# Patient Record
Sex: Female | Born: 1935
Health system: Southern US, Community
[De-identification: ages and names within clinical notes are randomized; demographics above are authoritative.]

## PROBLEM LIST (undated history)

## (undated) DIAGNOSIS — R002 Palpitations: Secondary | ICD-10-CM

## (undated) DIAGNOSIS — I209 Angina pectoris, unspecified: Secondary | ICD-10-CM

## (undated) DIAGNOSIS — Z9889 Other specified postprocedural states: Secondary | ICD-10-CM

## (undated) DIAGNOSIS — C801 Malignant (primary) neoplasm, unspecified: Secondary | ICD-10-CM

## (undated) DIAGNOSIS — E739 Lactose intolerance, unspecified: Secondary | ICD-10-CM

## (undated) DIAGNOSIS — K589 Irritable bowel syndrome without diarrhea: Secondary | ICD-10-CM

## (undated) DIAGNOSIS — N3946 Mixed incontinence: Secondary | ICD-10-CM

## (undated) DIAGNOSIS — I10 Essential (primary) hypertension: Secondary | ICD-10-CM

## (undated) DIAGNOSIS — N390 Urinary tract infection, site not specified: Secondary | ICD-10-CM

## (undated) DIAGNOSIS — H9190 Unspecified hearing loss, unspecified ear: Secondary | ICD-10-CM

## (undated) DIAGNOSIS — K219 Gastro-esophageal reflux disease without esophagitis: Secondary | ICD-10-CM

## (undated) DIAGNOSIS — I351 Nonrheumatic aortic (valve) insufficiency: Secondary | ICD-10-CM

## (undated) DIAGNOSIS — K529 Noninfective gastroenteritis and colitis, unspecified: Secondary | ICD-10-CM

## (undated) DIAGNOSIS — R1013 Epigastric pain: Secondary | ICD-10-CM

## (undated) DIAGNOSIS — I4891 Unspecified atrial fibrillation: Secondary | ICD-10-CM

## (undated) DIAGNOSIS — U071 COVID-19: Secondary | ICD-10-CM

## (undated) DIAGNOSIS — M1990 Unspecified osteoarthritis, unspecified site: Secondary | ICD-10-CM

## (undated) DIAGNOSIS — E538 Deficiency of other specified B group vitamins: Secondary | ICD-10-CM

## (undated) DIAGNOSIS — E785 Hyperlipidemia, unspecified: Secondary | ICD-10-CM

## (undated) DIAGNOSIS — R011 Cardiac murmur, unspecified: Secondary | ICD-10-CM

## (undated) DIAGNOSIS — R112 Nausea with vomiting, unspecified: Secondary | ICD-10-CM

## (undated) DIAGNOSIS — S83252A Bucket-handle tear of lateral meniscus, current injury, left knee, initial encounter: Secondary | ICD-10-CM

## (undated) DIAGNOSIS — N9089 Other specified noninflammatory disorders of vulva and perineum: Secondary | ICD-10-CM

## (undated) HISTORY — PX: APPENDECTOMY: SHX54

## (undated) HISTORY — DX: COVID-19: U07.1

## (undated) HISTORY — DX: Unspecified osteoarthritis, unspecified site: M19.90

## (undated) HISTORY — DX: Lactose intolerance, unspecified: E73.9

## (undated) HISTORY — DX: Epigastric pain: R10.13

## (undated) HISTORY — DX: Other specified postprocedural states: Z98.890

## (undated) HISTORY — PX: BONE GRAFT HIP ILIAC CREST: SUR159

## (undated) HISTORY — PX: MOUTH SURGERY: SHX715

## (undated) HISTORY — DX: Irritable bowel syndrome, unspecified: K58.9

## (undated) HISTORY — PX: DILATION AND CURETTAGE OF UTERUS: SHX78

## (undated) HISTORY — DX: Deficiency of other specified B group vitamins: E53.8

## (undated) HISTORY — DX: Essential (primary) hypertension: I10

## (undated) HISTORY — DX: Nonrheumatic aortic (valve) insufficiency: I35.1

## (undated) HISTORY — DX: Malignant (primary) neoplasm, unspecified: C80.1

## (undated) HISTORY — DX: Other specified noninflammatory disorders of vulva and perineum: N90.89

## (undated) HISTORY — PX: CARDIAC CATHETERIZATION: SHX172

## (undated) HISTORY — DX: Unspecified atrial fibrillation: I48.91

## (undated) HISTORY — DX: Hyperlipidemia, unspecified: E78.5

## (undated) HISTORY — DX: Mixed incontinence: N39.46

## (undated) HISTORY — DX: Urinary tract infection, site not specified: N39.0

## (undated) HISTORY — DX: Noninfective gastroenteritis and colitis, unspecified: K52.9

## (undated) HISTORY — PX: TONSILLECTOMY: SUR1361

## (undated) HISTORY — PX: BREAST REDUCTION SURGERY: SHX8

## (undated) HISTORY — PX: BREAST SURGERY: SHX581

## (undated) HISTORY — PX: ESOPHAGOGASTRODUODENOSCOPY: SHX1529

---

## 1980-11-23 HISTORY — PX: ABDOMINAL HYSTERECTOMY: SHX81

## 1984-11-23 HISTORY — PX: BILATERAL SALPINGOOPHORECTOMY: SHX1223

## 2001-11-02 ENCOUNTER — Encounter (INDEPENDENT_AMBULATORY_CARE_PROVIDER_SITE_OTHER): Payer: Self-pay | Admitting: Specialist

## 2001-11-02 ENCOUNTER — Ambulatory Visit (HOSPITAL_COMMUNITY): Admission: RE | Admit: 2001-11-02 | Discharge: 2001-11-02 | Payer: Self-pay | Admitting: Gastroenterology

## 2001-11-11 ENCOUNTER — Other Ambulatory Visit: Admission: RE | Admit: 2001-11-11 | Discharge: 2001-11-11 | Payer: Self-pay | Admitting: Family Medicine

## 2004-10-01 ENCOUNTER — Ambulatory Visit: Payer: Self-pay | Admitting: Family Medicine

## 2004-11-18 ENCOUNTER — Ambulatory Visit: Payer: Self-pay | Admitting: Family Medicine

## 2004-11-23 HISTORY — PX: HEMORRHOID SURGERY: SHX153

## 2004-11-23 LAB — HM DEXA SCAN: HM Dexa Scan: NORMAL

## 2004-12-10 ENCOUNTER — Ambulatory Visit: Payer: Self-pay | Admitting: Family Medicine

## 2005-01-26 ENCOUNTER — Ambulatory Visit (HOSPITAL_COMMUNITY): Admission: RE | Admit: 2005-01-26 | Discharge: 2005-01-26 | Payer: Self-pay | Admitting: *Deleted

## 2005-01-26 ENCOUNTER — Ambulatory Visit (HOSPITAL_BASED_OUTPATIENT_CLINIC_OR_DEPARTMENT_OTHER): Admission: RE | Admit: 2005-01-26 | Discharge: 2005-01-26 | Payer: Self-pay | Admitting: *Deleted

## 2005-01-26 ENCOUNTER — Encounter (INDEPENDENT_AMBULATORY_CARE_PROVIDER_SITE_OTHER): Payer: Self-pay | Admitting: *Deleted

## 2005-06-16 ENCOUNTER — Other Ambulatory Visit: Admission: RE | Admit: 2005-06-16 | Discharge: 2005-06-16 | Payer: Self-pay | Admitting: Family Medicine

## 2005-06-16 ENCOUNTER — Ambulatory Visit: Payer: Self-pay | Admitting: Family Medicine

## 2005-06-16 LAB — CONVERTED CEMR LAB: Pap Smear: NORMAL

## 2005-07-20 ENCOUNTER — Ambulatory Visit: Payer: Self-pay | Admitting: Family Medicine

## 2005-10-09 ENCOUNTER — Ambulatory Visit: Payer: Self-pay | Admitting: Family Medicine

## 2005-11-18 ENCOUNTER — Ambulatory Visit: Payer: Self-pay | Admitting: Family Medicine

## 2006-02-17 ENCOUNTER — Ambulatory Visit: Payer: Self-pay | Admitting: Family Medicine

## 2006-03-03 ENCOUNTER — Ambulatory Visit: Payer: Self-pay | Admitting: Family Medicine

## 2006-07-14 ENCOUNTER — Ambulatory Visit: Payer: Self-pay | Admitting: Family Medicine

## 2006-10-28 ENCOUNTER — Ambulatory Visit: Payer: Self-pay | Admitting: Family Medicine

## 2006-11-23 LAB — HM COLONOSCOPY

## 2007-04-21 ENCOUNTER — Encounter (INDEPENDENT_AMBULATORY_CARE_PROVIDER_SITE_OTHER): Payer: Self-pay | Admitting: *Deleted

## 2007-06-24 ENCOUNTER — Encounter: Payer: Self-pay | Admitting: Family Medicine

## 2007-06-24 DIAGNOSIS — K5900 Constipation, unspecified: Secondary | ICD-10-CM | POA: Insufficient documentation

## 2007-06-24 DIAGNOSIS — E785 Hyperlipidemia, unspecified: Secondary | ICD-10-CM | POA: Insufficient documentation

## 2007-06-24 DIAGNOSIS — K589 Irritable bowel syndrome without diarrhea: Secondary | ICD-10-CM | POA: Insufficient documentation

## 2007-06-24 DIAGNOSIS — Z85828 Personal history of other malignant neoplasm of skin: Secondary | ICD-10-CM | POA: Insufficient documentation

## 2007-06-24 DIAGNOSIS — L2089 Other atopic dermatitis: Secondary | ICD-10-CM | POA: Insufficient documentation

## 2007-07-19 ENCOUNTER — Ambulatory Visit: Payer: Self-pay | Admitting: Family Medicine

## 2007-07-19 DIAGNOSIS — I10 Essential (primary) hypertension: Secondary | ICD-10-CM | POA: Insufficient documentation

## 2007-07-20 LAB — CONVERTED CEMR LAB
ALT: 18 units/L (ref 0–35)
AST: 17 units/L (ref 0–37)
Albumin: 3.7 g/dL (ref 3.5–5.2)
BUN: 18 mg/dL (ref 6–23)
Basophils Absolute: 0.1 10*3/uL (ref 0.0–0.1)
Basophils Relative: 1 % (ref 0.0–1.0)
CO2: 28 meq/L (ref 19–32)
Calcium: 9.9 mg/dL (ref 8.4–10.5)
Chloride: 105 meq/L (ref 96–112)
Cholesterol: 186 mg/dL (ref 0–200)
Creatinine, Ser: 0.8 mg/dL (ref 0.4–1.2)
Eosinophils Absolute: 0.2 10*3/uL (ref 0.0–0.6)
Eosinophils Relative: 3.2 % (ref 0.0–5.0)
GFR calc Af Amer: 91 mL/min
GFR calc non Af Amer: 75 mL/min
Glucose, Bld: 95 mg/dL (ref 70–99)
HCT: 36.2 % (ref 36.0–46.0)
HDL: 61 mg/dL (ref >39.0)
Hemoglobin: 12.5 g/dL (ref 12.0–15.0)
LDL Cholesterol: 101 mg/dL — ABNORMAL HIGH (ref 0–99)
Lymphocytes Relative: 34.6 % (ref 12.0–46.0)
MCHC: 34.5 g/dL (ref 30.0–36.0)
MCV: 87.2 fL (ref 78.0–100.0)
Monocytes Absolute: 0.6 10*3/uL (ref 0.2–0.7)
Monocytes Relative: 8.4 % (ref 3.0–11.0)
Neutro Abs: 3.7 10*3/uL (ref 1.4–7.7)
Neutrophils Relative %: 52.8 % (ref 43.0–77.0)
Phosphorus: 4.4 mg/dL (ref 2.3–4.6)
Platelets: 329 10*3/uL (ref 150–400)
Potassium: 4.1 meq/L (ref 3.5–5.1)
RBC: 4.16 M/uL (ref 3.87–5.11)
RDW: 16.8 % — ABNORMAL HIGH (ref 11.5–14.6)
Sodium: 139 meq/L (ref 135–145)
Total CHOL/HDL Ratio: 3
Triglycerides: 120 mg/dL (ref 0–149)
VLDL: 24 mg/dL (ref 0–40)
Varicella IgG: 3.11 — ABNORMAL HIGH
WBC: 7 10*3/uL (ref 4.5–10.5)

## 2007-11-07 ENCOUNTER — Telehealth: Payer: Self-pay | Admitting: Family Medicine

## 2007-11-30 ENCOUNTER — Telehealth: Payer: Self-pay | Admitting: Family Medicine

## 2007-12-01 ENCOUNTER — Ambulatory Visit: Payer: Self-pay | Admitting: Family Medicine

## 2008-04-23 ENCOUNTER — Telehealth: Payer: Self-pay | Admitting: Family Medicine

## 2008-05-01 ENCOUNTER — Encounter: Payer: Self-pay | Admitting: Family Medicine

## 2008-05-07 ENCOUNTER — Encounter (INDEPENDENT_AMBULATORY_CARE_PROVIDER_SITE_OTHER): Payer: Self-pay | Admitting: *Deleted

## 2008-08-20 ENCOUNTER — Ambulatory Visit: Payer: Self-pay | Admitting: Family Medicine

## 2008-08-20 DIAGNOSIS — N951 Menopausal and female climacteric states: Secondary | ICD-10-CM | POA: Insufficient documentation

## 2008-08-23 LAB — CONVERTED CEMR LAB
ALT: 12 units/L (ref 0–35)
AST: 17 units/L (ref 0–37)
Albumin: 3.9 g/dL (ref 3.5–5.2)
Alkaline Phosphatase: 62 units/L (ref 39–117)
BUN: 19 mg/dL (ref 6–23)
Basophils Absolute: 0 10*3/uL (ref 0.0–0.1)
Basophils Relative: 0.4 % (ref 0.0–3.0)
Bilirubin, Direct: 0.1 mg/dL (ref 0.0–0.3)
CO2: 28 meq/L (ref 19–32)
Calcium: 9.6 mg/dL (ref 8.4–10.5)
Chloride: 106 meq/L (ref 96–112)
Cholesterol: 184 mg/dL (ref 0–200)
Creatinine, Ser: 0.9 mg/dL (ref 0.4–1.2)
Eosinophils Absolute: 0.3 10*3/uL (ref 0.0–0.7)
Eosinophils Relative: 4 % (ref 0.0–5.0)
GFR calc Af Amer: 79 mL/min
GFR calc non Af Amer: 66 mL/min
Glucose, Bld: 102 mg/dL — ABNORMAL HIGH (ref 70–99)
HCT: 39 % (ref 36.0–46.0)
HDL: 56.6 mg/dL (ref >39.0)
Hemoglobin: 13.3 g/dL (ref 12.0–15.0)
LDL Cholesterol: 107 mg/dL — ABNORMAL HIGH (ref 0–99)
Lymphocytes Relative: 35.4 % (ref 12.0–46.0)
MCHC: 34.2 g/dL (ref 30.0–36.0)
MCV: 91 fL (ref 78.0–100.0)
Monocytes Absolute: 0.6 10*3/uL (ref 0.1–1.0)
Monocytes Relative: 9 % (ref 3.0–12.0)
Neutro Abs: 3.5 10*3/uL (ref 1.4–7.7)
Neutrophils Relative %: 51.2 % (ref 43.0–77.0)
Phosphorus: 4.8 mg/dL — ABNORMAL HIGH (ref 2.3–4.6)
Platelets: 303 10*3/uL (ref 150–400)
Potassium: 4.4 meq/L (ref 3.5–5.1)
RBC: 4.29 M/uL (ref 3.87–5.11)
RDW: 14.5 % (ref 11.5–14.6)
Sodium: 142 meq/L (ref 135–145)
TSH: 1.48 microintl units/mL (ref 0.35–5.50)
Total Bilirubin: 0.7 mg/dL (ref 0.3–1.2)
Total CHOL/HDL Ratio: 3.3
Total Protein: 7 g/dL (ref 6.0–8.3)
Triglycerides: 104 mg/dL (ref 0–149)
VLDL: 21 mg/dL (ref 0–40)
WBC: 6.8 10*3/uL (ref 4.5–10.5)

## 2008-08-23 LAB — HM DEXA SCAN: HM Dexa Scan: NORMAL

## 2008-08-28 ENCOUNTER — Ambulatory Visit: Payer: Self-pay | Admitting: Internal Medicine

## 2008-08-28 ENCOUNTER — Encounter: Payer: Self-pay | Admitting: Family Medicine

## 2008-10-30 ENCOUNTER — Telehealth: Payer: Self-pay | Admitting: Family Medicine

## 2008-11-21 ENCOUNTER — Ambulatory Visit: Payer: Self-pay | Admitting: Family Medicine

## 2009-04-01 ENCOUNTER — Ambulatory Visit: Payer: Self-pay | Admitting: Family Medicine

## 2009-04-12 ENCOUNTER — Telehealth: Payer: Self-pay | Admitting: Family Medicine

## 2009-04-30 ENCOUNTER — Telehealth: Payer: Self-pay | Admitting: Family Medicine

## 2009-05-13 ENCOUNTER — Encounter: Payer: Self-pay | Admitting: Family Medicine

## 2009-05-20 ENCOUNTER — Encounter (INDEPENDENT_AMBULATORY_CARE_PROVIDER_SITE_OTHER): Payer: Self-pay | Admitting: *Deleted

## 2009-08-22 ENCOUNTER — Ambulatory Visit: Payer: Self-pay | Admitting: Family Medicine

## 2009-08-22 DIAGNOSIS — R5381 Other malaise: Secondary | ICD-10-CM | POA: Insufficient documentation

## 2009-08-22 DIAGNOSIS — R5383 Other fatigue: Secondary | ICD-10-CM

## 2009-08-23 LAB — CONVERTED CEMR LAB
ALT: 12 units/L (ref 0–35)
AST: 16 units/L (ref 0–37)
Albumin: 3.8 g/dL (ref 3.5–5.2)
Alkaline Phosphatase: 54 units/L (ref 39–117)
BUN: 19 mg/dL (ref 6–23)
Basophils Absolute: 0.1 10*3/uL (ref 0.0–0.1)
Basophils Relative: 0.8 % (ref 0.0–3.0)
Bilirubin, Direct: 0 mg/dL (ref 0.0–0.3)
CO2: 26 meq/L (ref 19–32)
Calcium: 9.2 mg/dL (ref 8.4–10.5)
Chloride: 109 meq/L (ref 96–112)
Cholesterol: 170 mg/dL (ref 0–200)
Creatinine, Ser: 0.8 mg/dL (ref 0.4–1.2)
Eosinophils Absolute: 0.2 10*3/uL (ref 0.0–0.7)
Eosinophils Relative: 3.4 % (ref 0.0–5.0)
Folate: 10.3 ng/mL
GFR calc non Af Amer: 74.85 mL/min (ref >60)
Glucose, Bld: 86 mg/dL (ref 70–99)
HCT: 36.7 % (ref 36.0–46.0)
HDL: 53.9 mg/dL (ref >39.00)
Hemoglobin: 12.6 g/dL (ref 12.0–15.0)
LDL Cholesterol: 90 mg/dL (ref 0–99)
Lymphocytes Relative: 36.8 % (ref 12.0–46.0)
Lymphs Abs: 2.4 10*3/uL (ref 0.7–4.0)
MCHC: 34.4 g/dL (ref 30.0–36.0)
MCV: 91.5 fL (ref 78.0–100.0)
Monocytes Absolute: 0.5 10*3/uL (ref 0.1–1.0)
Monocytes Relative: 8.1 % (ref 3.0–12.0)
Neutro Abs: 3.4 10*3/uL (ref 1.4–7.7)
Neutrophils Relative %: 50.9 % (ref 43.0–77.0)
Phosphorus: 3.6 mg/dL (ref 2.3–4.6)
Platelets: 260 10*3/uL (ref 150.0–400.0)
Potassium: 4.1 meq/L (ref 3.5–5.1)
RBC: 4.01 M/uL (ref 3.87–5.11)
RDW: 14.1 % (ref 11.5–14.6)
Sodium: 142 meq/L (ref 135–145)
TSH: 1.28 microintl units/mL (ref 0.35–5.50)
Total Bilirubin: 0.6 mg/dL (ref 0.3–1.2)
Total CHOL/HDL Ratio: 3
Total Protein: 6.3 g/dL (ref 6.0–8.3)
Triglycerides: 130 mg/dL (ref 0.0–149.0)
VLDL: 26 mg/dL (ref 0.0–40.0)
Vitamin B-12: 233 pg/mL (ref 211–911)
WBC: 6.6 10*3/uL (ref 4.5–10.5)

## 2009-08-26 LAB — CONVERTED CEMR LAB: Vit D, 25-Hydroxy: 33 ng/mL (ref 30–89)

## 2010-03-17 ENCOUNTER — Ambulatory Visit: Payer: Self-pay | Admitting: Family Medicine

## 2010-03-17 DIAGNOSIS — K219 Gastro-esophageal reflux disease without esophagitis: Secondary | ICD-10-CM | POA: Insufficient documentation

## 2010-03-17 DIAGNOSIS — K3189 Other diseases of stomach and duodenum: Secondary | ICD-10-CM | POA: Insufficient documentation

## 2010-03-17 DIAGNOSIS — R4589 Other symptoms and signs involving emotional state: Secondary | ICD-10-CM | POA: Insufficient documentation

## 2010-03-17 DIAGNOSIS — E538 Deficiency of other specified B group vitamins: Secondary | ICD-10-CM | POA: Insufficient documentation

## 2010-03-17 DIAGNOSIS — R1013 Epigastric pain: Secondary | ICD-10-CM

## 2010-03-24 ENCOUNTER — Telehealth: Payer: Self-pay | Admitting: Family Medicine

## 2010-03-24 DIAGNOSIS — N63 Unspecified lump in unspecified breast: Secondary | ICD-10-CM | POA: Insufficient documentation

## 2010-03-31 ENCOUNTER — Encounter: Payer: Self-pay | Admitting: Family Medicine

## 2010-03-31 LAB — HM MAMMOGRAPHY: HM Mammogram: NORMAL

## 2010-04-02 ENCOUNTER — Encounter: Payer: Self-pay | Admitting: Family Medicine

## 2010-04-02 ENCOUNTER — Encounter (INDEPENDENT_AMBULATORY_CARE_PROVIDER_SITE_OTHER): Payer: Self-pay | Admitting: *Deleted

## 2010-05-01 ENCOUNTER — Ambulatory Visit: Payer: Self-pay | Admitting: Family Medicine

## 2010-06-16 ENCOUNTER — Ambulatory Visit: Payer: Self-pay | Admitting: Family Medicine

## 2010-06-16 DIAGNOSIS — N949 Unspecified condition associated with female genital organs and menstrual cycle: Secondary | ICD-10-CM | POA: Insufficient documentation

## 2010-07-29 ENCOUNTER — Ambulatory Visit: Payer: Self-pay | Admitting: Family Medicine

## 2010-07-30 ENCOUNTER — Telehealth: Payer: Self-pay | Admitting: Family Medicine

## 2010-08-01 ENCOUNTER — Encounter: Payer: Self-pay | Admitting: Family Medicine

## 2010-08-04 ENCOUNTER — Encounter: Admission: RE | Admit: 2010-08-04 | Discharge: 2010-08-04 | Payer: Self-pay | Admitting: Gastroenterology

## 2010-08-04 ENCOUNTER — Ambulatory Visit: Payer: Self-pay | Admitting: Family Medicine

## 2010-08-04 DIAGNOSIS — M79606 Pain in leg, unspecified: Secondary | ICD-10-CM | POA: Insufficient documentation

## 2010-08-04 DIAGNOSIS — M79609 Pain in unspecified limb: Secondary | ICD-10-CM

## 2010-09-24 ENCOUNTER — Ambulatory Visit: Payer: Self-pay | Admitting: Family Medicine

## 2010-09-24 DIAGNOSIS — R519 Headache, unspecified: Secondary | ICD-10-CM | POA: Insufficient documentation

## 2010-09-24 DIAGNOSIS — R51 Headache: Secondary | ICD-10-CM | POA: Insufficient documentation

## 2010-09-25 LAB — CONVERTED CEMR LAB
ALT: 14 units/L (ref 0–35)
AST: 19 units/L (ref 0–37)
Albumin: 4 g/dL (ref 3.5–5.2)
Alkaline Phosphatase: 72 units/L (ref 39–117)
BUN: 17 mg/dL (ref 6–23)
Basophils Absolute: 0.1 10*3/uL (ref 0.0–0.1)
Basophils Relative: 0.9 % (ref 0.0–3.0)
Bilirubin, Direct: 0.1 mg/dL (ref 0.0–0.3)
CO2: 26 meq/L (ref 19–32)
Calcium: 9.5 mg/dL (ref 8.4–10.5)
Chloride: 109 meq/L (ref 96–112)
Cholesterol: 191 mg/dL (ref 0–200)
Creatinine, Ser: 0.7 mg/dL (ref 0.4–1.2)
Eosinophils Absolute: 0.2 10*3/uL (ref 0.0–0.7)
Eosinophils Relative: 3.3 % (ref 0.0–5.0)
GFR calc non Af Amer: 82.94 mL/min (ref >60)
Glucose, Bld: 89 mg/dL (ref 70–99)
HCT: 38.6 % (ref 36.0–46.0)
HDL: 69 mg/dL (ref >39.00)
Hemoglobin: 13.2 g/dL (ref 12.0–15.0)
LDL Cholesterol: 95 mg/dL (ref 0–99)
Lymphocytes Relative: 39.1 % (ref 12.0–46.0)
Lymphs Abs: 2.2 10*3/uL (ref 0.7–4.0)
MCHC: 34.2 g/dL (ref 30.0–36.0)
MCV: 91.1 fL (ref 78.0–100.0)
Monocytes Absolute: 0.5 10*3/uL (ref 0.1–1.0)
Monocytes Relative: 9.2 % (ref 3.0–12.0)
Neutro Abs: 2.6 10*3/uL (ref 1.4–7.7)
Neutrophils Relative %: 47.5 % (ref 43.0–77.0)
Phosphorus: 4 mg/dL (ref 2.3–4.6)
Platelets: 282 10*3/uL (ref 150.0–400.0)
Potassium: 4.1 meq/L (ref 3.5–5.1)
RBC: 4.23 M/uL (ref 3.87–5.11)
RDW: 14.5 % (ref 11.5–14.6)
Sodium: 143 meq/L (ref 135–145)
TSH: 1.11 microintl units/mL (ref 0.35–5.50)
Total Bilirubin: 0.7 mg/dL (ref 0.3–1.2)
Total CHOL/HDL Ratio: 3
Total Protein: 6.6 g/dL (ref 6.0–8.3)
Triglycerides: 137 mg/dL (ref 0.0–149.0)
VLDL: 27.4 mg/dL (ref 0.0–40.0)
WBC: 5.6 10*3/uL (ref 4.5–10.5)

## 2010-10-15 ENCOUNTER — Telehealth: Payer: Self-pay | Admitting: Family Medicine

## 2010-11-05 ENCOUNTER — Ambulatory Visit: Payer: Self-pay | Admitting: Family Medicine

## 2010-11-13 ENCOUNTER — Encounter: Payer: Self-pay | Admitting: Family Medicine

## 2010-11-23 HISTORY — PX: OTHER SURGICAL HISTORY: SHX169

## 2010-11-28 ENCOUNTER — Encounter
Admission: RE | Admit: 2010-11-28 | Discharge: 2010-11-28 | Payer: Self-pay | Source: Home / Self Care | Attending: Neurology | Admitting: Neurology

## 2010-12-02 ENCOUNTER — Encounter: Payer: Self-pay | Admitting: Family Medicine

## 2010-12-16 ENCOUNTER — Ambulatory Visit
Admission: RE | Admit: 2010-12-16 | Discharge: 2010-12-16 | Payer: Self-pay | Source: Home / Self Care | Attending: Family Medicine | Admitting: Family Medicine

## 2010-12-25 NOTE — Progress Notes (Signed)
Summary: Rx Crestor  Phone Note Other Incoming   Summary of Call: form from [facility] for crestor refil  Follow-up for Phone Call        form done and in nurse in box  Follow-up by: Allena Earing MD,  October 15, 2010 2:06 PM  Additional Follow-up for Phone Call Additional follow up Details #1::        Rx faxed back to [facility]. Additional Follow-up by: Emelia Salisbury LPN,  October 15, 6282 3:51 PM    New/Updated Medications: CRESTOR 10 MG  TABS (ROSUVASTATIN CALCIUM) one by mouth once daily Prescriptions: CRESTOR 10 MG  TABS (ROSUVASTATIN CALCIUM) one by mouth once daily  #90 x 3   Entered by:   Emelia Salisbury LPN   Authorized by:   Allena Earing MD   Signed by:   Emelia Salisbury LPN on 15/17/6160   Method used:   Printed then faxed to ...       [facility] (mail-order)             , [place]         Ph: [phone]       Fax: [phone]   RxID:   [id number]

## 2010-12-25 NOTE — Progress Notes (Signed)
Summary: tranxene  Phone Note Other Incoming Call back at fax   Call placed by: Lanetta Inch [phone] Summary of Call: faxed refill request for  generic tranxene 7.5 Initial call taken by: Marty Heck,  November 07, 2007 2:43 PM  Follow-up for Phone Call        please pull her chart- tranxene is not on her EMR med list yet    chart is in inbox thanks px written on EMR for call in  Follow-up by: Allena Earing MD,  November 07, 2007 5:10 PM  Additional Follow-up for Phone Call Additional follow up Details #1::        called to [facility] Additional Follow-up by: Marty Heck,  November 07, 2007 5:49 PM    New/Updated Medications: TRANXENE-T 7.5 MG  TABS (CLORAZEPATE DIPOTASSIUM) 1 by mouth once daily prn   Prescriptions: TRANXENE-T 7.5 MG  TABS (CLORAZEPATE DIPOTASSIUM) 1 by mouth once daily prn  #30 x 0   Entered and Authorized by:   Allena Earing MD   Signed by:   Marty Heck on 11/07/2007   Method used:   Telephoned to ...         RxID:   [id number]

## 2010-12-25 NOTE — Progress Notes (Signed)
Summary: wants mammorgram referral  Phone Note Call from Patient Call back at Home Phone [phone]   Caller: Patient Call For: Tammy Earing MD Summary of Call: Patient is asking for a referral for mammogram. She goes to [facility]. She says that she feels some thickening in her left breast that she is a little worried about. Please advise.  Initial call taken by: Lacretia Nicks,  Mar 24, 2010 11:35 AM  Follow-up for Phone Call        will order dx mam and [place] then f/u with me 1 week later to examine the area  will route to [facility] Follow-up by: Tammy Earing MD,  Mar 24, 2010 11:52 AM  Additional Follow-up for Phone Call Additional follow up Details #1::        Left message for patient to call back. Ozzie Hoyle LPN  Mar 25, 8591 9:24 PM   Patient notified as instructed by telephone. Pt said  can go any day for the next two weeks except 03/25/10. Pt can be reached at [phone]. [place] Dr. Glori Bickers wants f/u appt one week after dx mammo and [place]. Please send back to me if I need to schedule f/u appt.Ozzie Hoyle LPN  Mar 25, 4627 6:38 PM   New Problems: BREAST MASS, LEFT (ICD-611.72)   Additional Follow-up for Phone Call Additional follow up Details #2::    [facility] on 03/31/2010 at 11:15 order faxed.  Follow-up by: Haynes Bast,  Mar 24, 2010 5:14 PM  New Problems: BREAST MASS, LEFT (ICD-611.72)

## 2010-12-25 NOTE — Miscellaneous (Signed)
  Clinical Lists Changes  Observations: Added new observation of MAMMO DUE: 04/2008 (04/21/2007 10:00) Added new observation of MAMMOGRAM: normal (04/12/2007 10:01)     Preventive Care Screening  Mammogram:    Date:  04/12/2007    Next Due:  04/2008    Results:  normal

## 2010-12-25 NOTE — Assessment & Plan Note (Signed)
Summary: B12 SHOT / [place]  Nurse Visit   Allergies: 1)  ! * Mucinex Dm 2)  Ceftin 3)  Epinephrine 4)  Cipro 5)  Norvasc 6)  Septra 7)  * Altace 8)  * Antihistamines 9)  * Toprol 10)  Lipitor  Medication Administration  Injection # 1:    Medication: Vit B12 1000 mcg    Diagnosis: B12 DEFICIENCY (ICD-266.2)    Route: IM    Site: R deltoid    Exp Date: 09/24/2011    Lot #: [id number]    Mfr: [employer]ent    Patient tolerated injection without complications    Given by: Ozzie Hoyle LPN (May 01, 1020 11:73 AM)  Orders Added: 1)  Vit B12 1000 mcg [J3420] 2)  Admin of Therapeutic Inj  intramuscular or subcutaneous [56701]

## 2010-12-25 NOTE — Assessment & Plan Note (Signed)
Summary: CPX   Vital Signs:  Patient Profile:   76 Years Old Female Height:     64.25 inches Weight:      216 pounds Temp:     97.9 degrees F oral Pulse rate:   76 / minute Pulse rhythm:   regular BP sitting:   128 / 70  (right arm) Cuff size:   large  Vitals Entered By: Marty Heck (July 19, 2007 9:38 AM)               Chief Complaint:  30 minute check up.  History of Present Illness: has been doing ok, good days and bad days because of age overall had a good year- went on a cruise, had art shows has never had shingles or chicken pox- she thinks  no gyn symptoms or breast lumps colonosc is utd last dexa 2004 tries to stay healthy has been to Dr Maxie Better for her knee- OA- arthritis- does exercises has done a fair amt of swimming thinks she could use a bike diet has been fine- eats a lot of whole wheat items and vegetables can't tolerate milk (GI)- can eat some cheeze [place] on calcium supplement  is due for labs and chol- is fasting  unsure when last derm f/u was _? 2 y ago   Current Allergies: [place]   Family History:    Father: deceased age 74- CAD    Mother: deceased age 87- colon cancer    Siblings: 2 brothers- 46 with DM. 2 sisters- both with DM    P aunt breast ca    high chol in family    Review of Systems      See HPI  General      Denies chills, fatigue, fever, and loss of appetite.  Eyes      Denies blurring.  CV      Denies chest pain or discomfort, shortness of breath with exertion, and swelling of feet.  Resp      Denies chest pain with inspiration.  GI      Denies bloody stools, change in bowel habits, nausea, and vomiting.  GU      Denies discharge, dysuria, and urinary frequency.  MS      Complains of joint pain and stiffness.      Denies joint redness and joint swelling.  Derm      Denies changes in color of skin and rash.  Neuro  Denies numbness.  Psych      Denies anxiety and depression.      mood has been generally ok  Endo      Denies excessive hunger and excessive thirst.   Physical Exam  General:     obese but well appearing Head:     Normocephalic and atraumatic without obvious abnormalities. No apparent alopecia or balding. Eyes:     vision grossly intact, pupils equal, pupils round, and pupils reactive to light.   Ears:     R ear normal and L ear normal.   Nose:     no nasal discharge.  - nares are slt boggy Mouth:     pharynx pink and moist.   Neck:     No deformities, masses, or tenderness noted.supple, no thyromegaly, no JVD, and no carotid bruits.   Chest Wall:     No deformities, masses, or tenderness noted. Breasts:     No mass, nodules, thickening, tenderness, bulging, retraction, inflamation,  nipple discharge or skin changes noted.  (does have scars from prior breast sx with some thickness- esp medially on both breasts) Lungs:     Normal respiratory effort, chest expands symmetrically. Lungs are clear to auscultation, no crackles or wheezes. Heart:     Normal rate and regular rhythm. S1 and S2 normal without gallop, murmur, click, rub or other extra sounds. Abdomen:     Bowel sounds positive,abdomen soft and non-tender without masses, organomegaly or hernias noted. Msk:     No deformity or scoliosis noted of thoracic or lumbar spine.   no acute joint changes gait is slt affected by knee pain Pulses:     R and L carotid,radial,femoral,dorsalis pedis and posterior tibial pulses are full and equal bilaterally Extremities:     No clubbing, cyanosis, edema, or deformity noted with normal full range of motion of all joints.   Neurologic:     strength normal in all extremities, sensation intact to light touch, gait normal, and DTRs symmetrical and normal.   Skin:     turgor normal, color normal, and no rashes.   Cervical Nodes:     No lymphadenopathy noted Axillary Nodes:     No  palpable lymphadenopathy Inguinal Nodes:     No significant adenopathy Psych:     pleasant affect    Impression & Recommendations:  Problem # 1:  HYPERLIPIDEMIA (ICD-272.4) will check labs on good diet and statin disc impt of watching sat fats in diet also exercise and reduced calories for wt loss (which would also help joint pain in the long run) Her updated medication list for this problem includes:    Crestor 10 Mg Tabs (Rosuvastatin calcium) ..... One by mouth q d  Orders: Venipuncture (25750) TLB-Lipid Panel (80061-LIPID) TLB-CBC Platelet - w/Differential (85025-CBCD) TLB-ALT (SGPT) (84460-ALT) TLB-AST (SGOT) (84450-SGOT)   Problem # 2:  HYPERTENSION, ESSENTIAL NOS (ICD-401.9) will check renal prof/cbc good control on avapro Her updated medication list for this problem includes:    Avapro 150 Mg Tabs (Irbesartan) ..... One by mouth q d  Orders: Venipuncture (51833) TLB-CBC Platelet - w/Differential (85025-CBCD) TLB-Renal Function Panel (80069-RENAL)   Problem # 3:  EXPOSURE TO COMMUNICABLE DISEASE, NEC (ICD-V01.89) pt unsure if she ever had chicken pox will check titer and consid vaccine if titer is pos, is a candidate for zostavax Orders: Venipuncture (58251) T- * Misc. Laboratory test [phone])   Complete Medication List: 1)  Avapro 150 Mg Tabs (Irbesartan) .... One by mouth q d 2)  Crestor 10 Mg Tabs (Rosuvastatin calcium) .... One by mouth q d 3)  Centrum Silver Tabs (Multiple vitamins-minerals) .... One by mouth q d 4)  Adult Aspirin Low Strength 81 Mg Tbdp (Aspirin) .... One by mouth q d 5)  Calcium 600 Mg Tabs (Calcium) .... Take 2 by mouth qd 6)  Glucosamine 500 Mg Caps (Glucosamine sulfate) .... Take 2 by mouth qd   Patient Instructions: 1)  the recommendation for calcium is 1200-1500 mg daily, and vitamin D is 800 International Units daily 2)  we will do labs today, including test to see if you have had chicken pox 3)  make f/u appointment with  your dermatologist for skin screening this year     Prior Medications: AVAPRO 150 MG  TABS (IRBESARTAN) one by mouth q d CRESTOR 10 MG  TABS (ROSUVASTATIN CALCIUM) one by mouth q d CENTRUM SILVER   TABS (MULTIPLE VITAMINS-MINERALS) one by mouth q d ADULT ASPIRIN LOW STRENGTH 81 MG  TBDP (  ASPIRIN) one by mouth q d CALCIUM 600 MG  TABS (CALCIUM) take 2 by mouth qd GLUCOSAMINE 500 MG  CAPS (GLUCOSAMINE SULFATE) take 2 by mouth qd Current Allergies: CEFTIN EPINEPHRINE CIPRO NORVASC SEPTRA * ALTACE * ANTIHISTAMINES * TOPROL LIPITOR   Preventive Care Screening     breast exam 8/08

## 2010-12-25 NOTE — Assessment & Plan Note (Signed)
Summary: CHECK UP/CLE   Vital Signs:  Patient Profile:   76 Years Old Female Height:     64.25 inches Weight:      218 pounds Temp:     97.7 degrees F oral Pulse rate:   76 / minute Pulse rhythm:   regular BP sitting:   124 / 70  (left arm) Cuff size:   large  Vitals Entered By: Daralene Milch (August 20, 2008 9:34 AM)                 Chief Complaint:  cpx.  History of Present Illness: is doing overall ok   back is bothering her more lately  usually walks almost every day-- for 1 mile in 20 minutes   wt is down 3 lb today  bp is in good control   had her flu shot 2 days ago   needs labs   is intersted in shingles vaccine today -- but just had flu shot  is interested in pneumovax   last pap in 2006 -- and had hyst - and no symptoms or new partners   nl dexa in past -- is on ca and vit D-- and exercise  wants dexa this year   saw Dr Tonita Cong -- ortho-- put some shots in knees for OA (is doing well)  he checked her back too-- had some degenerative changes in LS     Current Allergies (reviewed today): CEFTIN EPINEPHRINE CIPRO NORVASC SEPTRA * ALTACE * ANTIHISTAMINES * TOPROL LIPITOR  Past Medical History:    Skin cancer, hx of- basal cell on nose    Hyperlipidemia    OA knees     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         chiropactor     ortho -- Dr Tonita Cong    GI -- Dr Allyn Kenner   Past Surgical History:    Hysterectomy (1982)-- for large fibroids and bleeding     BSO (1986)    Right hip graft to left arm    Appendectomy    Colonoscopy- polyp (03/1996)    Breast reduction    Dexa- normal (10/1999), normal (12/2002), normal (11/2004)    Colonoscopy- hemorrhoids, divertics, polyps (12/20070    Stress cardiolite- neg (11/2003)    Hemorrhoidectomy (2006)    Colonoscopy- tics, hemorrhoids (11/2006)    EGD- polyps   Family History:    Father: deceased age 65- CAD    Mother: deceased age 26- colon cancer    Siblings: 2 brothers- 10 with DM. 2 sisters- both with  DM    P aunt breast ca    high chol in family    uncle died of leukemia     father's family -- CAD and DM common   Social History:    Marital Status: Married    Children: 2    Occupation: Proofreader    quit smoking in 1960s     rare alcohol    Risk Factors:  Colonoscopy History:     Date of Last Colonoscopy:  12/06/2006    Results:  Diverticulosis    Review of Systems  General      Denies fatigue, fever, loss of appetite, malaise, and weight loss.  Eyes      Denies blurring and eye pain.  CV      Denies chest pain or discomfort, palpitations, shortness of breath with exertion, and swelling of feet.  Resp      Denies cough, shortness of breath, and wheezing.  GI      Denies abdominal pain, bloody stools, and change in bowel habits.  GU      Denies abnormal vaginal bleeding, discharge, and dysuria.  MS      Complains of joint pain and stiffness.      Denies joint redness and joint swelling.  Derm      Denies changes in color of skin, lesion(s), and rash.  Neuro      Denies numbness, tingling, and weakness.  Psych      mood is fairly good lately  Endo      Denies excessive thirst and excessive urination.   Physical Exam  General:     alert, well-developed, well-nourished, and overweight-appearing.  NAD Head:     Normocephalic and atraumatic without obvious abnormalities. No apparent alopecia or balding. Eyes:     vision grossly intact, pupils equal, pupils round, and pupils reactive to light.   Ears:     R ear normal and L ear normal.   Nose:     no nasal discharge.   Mouth:     pharynx pink and moist.   Neck:     No deformities, masses, or tenderness noted.supple, no thyromegaly, no JVD, and no carotid bruits.   Breasts:     No mass, nodules, thickening, tenderness, bulging, retraction, inflamation, nipple discharge or skin changes noted.  (does have scars from prior breast sx with some thickness- esp medially on both breasts) Lungs:      moist harsh cough, normal respiratory effort, no crackles, and no wheezes.   Heart:     Normal rate and regular rhythm. S1 and S2 normal without gallop, murmur, click, rub or other extra sounds. Abdomen:     Bowel sounds positive,abdomen soft and non-tender without masses, organomegaly or hernias noted. no renal bruits  Msk:     No deformity or scoliosis noted of thoracic or lumbar spine.  overall poor rom of knees  Pulses:     R and L carotid,radial,femoral,dorsalis pedis and posterior tibial pulses are full and equal bilaterally Extremities:     No clubbing, cyanosis, edema, or deformity noted with normal full range of motion of all joints.   Neurologic:     sensation intact to light touch, gait normal, and DTRs symmetrical and normal.   Skin:     Intact without suspicious lesions or rashessome lentigos  Cervical Nodes:     No lymphadenopathy noted Axillary Nodes:     No palpable lymphadenopathy Inguinal Nodes:     No significant adenopathy Psych:     normal affect, talkative and pleasant     Impression & Recommendations:  Problem # 1:  HYPERTENSION, ESSENTIAL NOS (ICD-401.9) Assessment: Unchanged bp remains in good control with avapro and exercise  enc to keep working on wt loss  labs today Her updated medication list for this problem includes:    Avapro 150 Mg Tabs (Irbesartan) ..... One by mouth once daily  BP today: 124/70 Prior BP: 131/73 (12/01/2007)  Labs Reviewed: Creat: 0.8 (07/19/2007) Chol: 186 (07/19/2007)   HDL: 61.0 (07/19/2007)   LDL: 101 (07/19/2007)   TG: 120 (07/19/2007)  Orders: Venipuncture (40981) TLB-Lipid Panel (80061-LIPID) TLB-Renal Function Panel (80069-RENAL) TLB-CBC Platelet - w/Differential (85025-CBCD) TLB-Hepatic/Liver Function Pnl (80076-HEPATIC) TLB-TSH (Thyroid Stimulating Hormone) (84443-TSH)   Problem # 2:  HYPERLIPIDEMIA (ICD-272.4) Assessment: Unchanged has been adequately controlled with crestor and diet  labs today-  and advise  urged to stick with low sat fat diet  Her updated medication  list for this problem includes:    Crestor 10 Mg Tabs (Rosuvastatin calcium) ..... One by mouth once daily  Labs Reviewed: Chol: 186 (07/19/2007)   HDL: 61.0 (07/19/2007)   LDL: 101 (07/19/2007)   TG: 120 (07/19/2007) SGOT: 17 (07/19/2007)   SGPT: 18 (07/19/2007)  Orders: Venipuncture (34196) TLB-Lipid Panel (80061-LIPID) TLB-Renal Function Panel (80069-RENAL) TLB-CBC Platelet - w/Differential (85025-CBCD) TLB-Hepatic/Liver Function Pnl (80076-HEPATIC) TLB-TSH (Thyroid Stimulating Hormone) (84443-TSH)   Problem # 3:  SKIN CANCER, HX OF (ICD-V10.83) Assessment: Comment Only adv pt to keep up regular derm f/u and wear sunscreen no skin changes today  Problem # 4:  POSTMENOPAUSAL STATUS (ICD-627.2) Assessment: Unchanged on ca and vit D  ordered dexa  Orders: Radiology Referral (Radiology)   Problem # 5:  Preventive Health Care (ICD-V70.0) pneumovax today thinking about zoster vaccine in spring   Complete Medication List: 1)  Avapro 150 Mg Tabs (Irbesartan) .... One by mouth once daily 2)  Crestor 10 Mg Tabs (Rosuvastatin calcium) .... One by mouth once daily 3)  Centrum Silver Tabs (Multiple vitamins-minerals) .... One by mouth q d 4)  Adult Aspirin Low Strength 81 Mg Tbdp (Aspirin) .... One by mouth q d 5)  Calcium 600 Mg Tabs (Calcium) .... Take 2 by mouth qd 6)  Glucosamine 500 Mg Caps (Glucosamine sulfate) .... Take 2 by mouth qd 7)  Tranxene-t 7.5 Mg Tabs (Clorazepate dipotassium) .[name] Kitchen.. 1 by mouth once daily as needed 8)  [facility]vs Magnesium 250 Mg Tabs (Magnesium) .... As directed 9)  Bl Zinc-vitamin C Lozenge 8-100 Mg Lozg (Zinc gluconate-vitamin c) .... As directed 10)  Probiotic Tabs  .... Daily  Other Orders: Pneumococcal Vaccine [phone]) Admin 1st Vaccine [phone])   Patient Instructions: 1)  check with your insurance co-- about coverage of shingles vaccine (zostavax) -- then call us to  schedule  2)  keep working hard on diet and exercise and weight loss -- good job so far 3)  pneumonia vaccine today 4)  we will schedule dexa at check out  5)  think about nuts and soy/tofu and low fat /lactose free dairy for your protien in diet    Prescriptions: TRANXENE-T 7.5 MG  TABS (CLORAZEPATE DIPOTASSIUM) 1 by mouth once daily as needed  #30 x 0   Entered and Authorized by:   Allena Earing MD   Signed by:   Allena Earing MD on 08/20/2008   Method used:   Print then Give to Patient   RxID:   [id number] CRESTOR 10 MG  TABS (ROSUVASTATIN CALCIUM) one by mouth once daily  #90 x 3   Entered and Authorized by:   Allena Earing MD   Signed by:   Allena Earing MD on 08/20/2008   Method used:   Print then Give to Patient   RxID:   [id number] AVAPRO 150 MG  TABS (IRBESARTAN) one by mouth once daily  #90 x 3   Entered and Authorized by:   Allena Earing MD   Signed by:   Allena Earing MD on 08/20/2008   Method used:   Print then Give to Patient   RxID:   [id number]  ]  Preventive Care Screening  Colonoscopy:    Date:  12/06/2006    Next Due:  11/2011    Results:  Diverticulosis   Last Flu Shot:    Date:  08/18/2008    Results:  given    Pneumovax Vaccine    Vaccine Type: Pneumovax  Site: left deltoid    Mfr: Merck    Dose: 0.5 ml    Route: IM    Given by: Daralene Milch    Exp. Date: 05/25/2009    Lot #: [id number]    VIS given: 06/20/96 version given August 20, 2008.

## 2010-12-25 NOTE — Assessment & Plan Note (Signed)
Summary: LEG,KNEE PAIN/CLE   Vital Signs:  Patient profile:   76 year old female Height:      64.25 inches Weight:      217.25 pounds BMI:     37.13 Temp:     98.2 degrees F oral Pulse rate:   76 / minute Pulse rhythm:   regular BP sitting:   128 / 72  (left arm) Cuff size:   large  Vitals Entered By: Ozzie Hoyle LPN (August 04, 2352 2:53 PM) CC: Rt leg pain;  knee is not hurting now.   History of Present Illness: hurt her R knee and leg   her husband volunteered to help put up 2 doors for a shut in friend she was carrying them  her knee wiggled the wrong way and felt a little pain (inner R knee) that got worse as the day went on limped for 4 days and put ice on it  knee got better -- doing exercises for her knees and started walking again yesterday  now is having a lot of pain in inner thigh area  is really uncomfortable and tender to the touch  not swollen a little bruised   knee oly hurts when she flexes it as far as it can go   used ice initially 48  hours - then ice and heat on and off   at no time was she sedentary- kept moving no hx of blood clots      Allergies: 1)  ! 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  dyspepsia  mild B12 def  lactose intolerance mixed incontinence vulvar irritation from urine pad - uses triamcinolone oint   chiropactor  ortho -- Dr Tonita Cong GI -- Dr Allyn Kenner  derm- Dr Phillip Heal  Past Surgical History: Last updated: 09/18/08 Hysterectomy (1982)-- for large fibroids and bleeding  BSO (1986) Right hip graft to left arm Appendectomy Colonoscopy- polyp (03/1996) Breast reduction Dexa- normal (10/1999), normal (12/2002), normal (11/2004) Colonoscopy- hemorrhoids, divertics,  polyps (12/20070 Stress cardiolite- neg (11/2003) Hemorrhoidectomy (2006) Colonoscopy- tics, hemorrhoids (11/2006) EGD- polyps  Family History: Last updated: Sep 18, 2008 Father: deceased age 79- CAD Mother: deceased age 64- colon cancer Siblings: 2 brothers- 38 with DM. 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 CV:  Denies chest pain or discomfort, palpitations, and swelling of feet. Resp:  Denies cough, pleuritic, shortness of breath, and wheezing. GI:  Denies nausea and vomiting. MS:  Complains of joint pain, muscle aches, and stiffness; denies joint redness, joint swelling, cramps, muscle weakness, and thoracic pain. Derm:  Denies poor wound healing and rash. Neuro:  Denies numbness, tingling, and weakness. Heme:  Denies abnormal bruising and bleeding.  Physical Exam  General:  overweight but generally well appearing  Head:  normocephalic, atraumatic, and no abnormalities observed.   Neck:  nl rom  Lungs:  Normal  respiratory effort, chest expands symmetrically. Lungs are clear to auscultation, no crackles or wheezes. Heart:  Normal rate and regular rhythm. S1 and S2 normal without gallop, murmur, click, rub or other extra sounds. Abdomen:  no suprapubic tenderness or fullness felt  Msk:  no LS tenderness- nl rom no buttock or SI tenderness  SLR nl bilat some pain on full flex of R knee and very slt medial tenderness some pain on hip ext rot (mild)  tender over R hamstring and medial knee no mass/ palp cord/ swelling/ redness noted  some varicosities  knee without swelling or eff - and nl rom   Pulses:   R and L carotid,radial,femoral,dorsalis pedis and posterior tibial pulses are full and equal bilaterally Extremities:  No clubbing, cyanosis, edema, or deformity noted with normal full range of motion of all joints.   Neurologic:  sensation intact to light touch, gait normal, and DTRs symmetrical and normal.   Skin:  Intact without suspicious lesions or rashes Inguinal Nodes:  No significant adenopathy Psych:  normal affect, talkative and pleasant    Impression & Recommendations:  Problem # 1:  LEG PAIN, RIGHT (ICD-729.5) Assessment New after knee injury in lifting accident  knee improves and leg is still sore  fairly b9 exam -- without swelling or warmth or palp cord  suspect hamstring +/- muscular strain adv heat and gentle stretching  tylenol as needed (pt does not tol nsaids) adv to update if not imp or if any swelling/ redness  Complete Medication List: 1)  Avapro 150 Mg Tabs (Irbesartan) .... One by mouth once daily 2)  Crestor 10 Mg Tabs (Rosuvastatin calcium) .... One by mouth once daily 3)  Adult Aspirin Low Strength 81 Mg Tbdp (Aspirin) .... One by mouth daily 4)  Calcium 600 Mg Tabs (Calcium) .... Take 2 by mouth daily 5)  Glucosamine 500 Mg Caps (Glucosamine sulfate) .... Take 2 by mouth daily 6)  Tranxene-t 7.5 Mg Tabs (Clorazepate dipotassium) .[name] Kitchen.. 1 by mouth once daily as needed 7)  [facility]vs Magnesium 250 Mg Tabs (Magnesium) .... As directed 8)  Niacin 250 Mg Tabs (Niacin) .... Take 1 tablet by mouth once a day 9)  Omeprazole 20 Mg Cpdr (Omeprazole) .[name] Kitchen.. 1 by mouth once daily in am about 30 minutes before breakfast 10)  Triamcinolone Acetonide 0.1 % Oint (Triamcinolone acetonide) .... Apply to affected areas once daily as needed (not more than twice per week)  Patient Instructions: 1)  try heat on affected area several times per day for 10 minutes  2)  keep it stretched gently 3)  if swelling or redness or increased tenderness/ let me know  4)  if not improved in about  10 days let me know  5)  tylenol for pain if you need it   Current Allergies (reviewed today): ! * MUCINEX DM CEFTIN EPINEPHRINE CIPRO NORVASC SEPTRA * ALTACE * ANTIHISTAMINES * TOPROL LIPITOR

## 2010-12-25 NOTE — Progress Notes (Signed)
Summary: Refills for mail order  Phone Note Call from Patient Call back at [phone]   Caller: Patient Call For: Dr. Glori Bickers Summary of Call: Pt called to advise that she has an appt. with Dalene Seltzer tomorrow and would like to pick up rx's for all of her medications to send off to her mail order pharmacy.. Initial call taken by: Emelia Salisbury,  November 30, 2007 4:54 PM  Follow-up for Phone Call        printed and in my out box for pick up  Follow-up by: Allena Earing MD,  November 30, 2007 6:13 PM  Additional Follow-up for Phone Call Additional follow up Details #1::        Scripts given to Santiago Glad Additional Follow-up by: Marty Heck,  December 01, 2007 8:45 AM      Prescriptions: CRESTOR 10 MG  TABS (ROSUVASTATIN CALCIUM) one by mouth q d  #90 x 3   Entered and Authorized by:   Allena Earing MD   Signed by:   Allena Earing MD on 11/30/2007   Method used:   Print then Give to Patient   RxID:   [id number] AVAPRO 150 MG  TABS (IRBESARTAN) one by mouth q d  #90 x 3   Entered and Authorized by:   Allena Earing MD   Signed by:   Allena Earing MD on 11/30/2007   Method used:   Print then Give to Patient   RxID:   [id number]

## 2010-12-25 NOTE — Assessment & Plan Note (Signed)
Summary: VIT B12 INJECTION/RI  Nurse Visit   Allergies: 1)  ! * Mucinex Dm 2)  Ceftin 3)  Epinephrine 4)  Cipro 5)  Norvasc 6)  Septra 7)  * Altace 8)  * Antihistamines 9)  * Toprol 10)  Lipitor  Medication Administration  Injection # 1:    Medication: Vit B12 1000 mcg    Diagnosis: B12 DEFICIENCY (ICD-266.2)    Route: IM    Site: R deltoid    Exp Date: 08/23/2012    Lot #: [id number]    Mfr: [place]    Patient tolerated injection without complications    Given by: Emelia Salisbury LPN (December 16, 7562 9:45 AM)  Orders Added: 1)  Vit B12 1000 mcg [J3420] 2)  Admin of Therapeutic Inj  intramuscular or subcutaneous [96372]   Medication Administration  Injection # 1:    Medication: Vit B12 1000 mcg    Diagnosis: B12 DEFICIENCY (ICD-266.2)    Route: IM    Site: R deltoid    Exp Date: 08/23/2012    Lot #: [id number]    Mfr: [employer]    Patient tolerated injection without complications    Given by: Emelia Salisbury LPN (December 17, 3327 9:45 AM)  Orders Added: 1)  Vit B12 1000 mcg [J3420] 2)  Admin of Therapeutic Inj  intramuscular or subcutaneous [[id number]]

## 2010-12-25 NOTE — Assessment & Plan Note (Signed)
Summary: CHECK UP/CLE   Vital Signs:  Patient profile:   76 year old female Height:      64.25 inches Weight:      215 pounds BMI:     36.75 Temp:     97.8 degrees F oral Pulse rate:   72 / minute Pulse rhythm:   regular BP sitting:   126 / 70  (left arm) Cuff size:   large  Vitals Entered By: Daralene Milch CMA Deborra Medina) (August 22, 2009 9:39 AM)  History of Present Illness: here for check up of chronic health issues  had a really busy summer taking care of people is really tired  is going to the beach  wt is stable walks every day - with her dogs   bp good at 126/70  lipid last check fairly controlled with crestor and diet -- LDL 107 last fall diet has not been as good as it should this summer - eating out more   last colonosc 08- rec 5 y f/u due to family hx no stool changes at all - no problems   pap 06- hyst in past  no symptoms at all   mam 6/10 self exam - no lumps   TD up to date 06 pneumovax 09 flu shot 3 weeks ago ? zostavax - never called to see if covered   no new family hx   she had "legs jumping "- takes low dose of niacin helps   had several more skin spots removed (hx of basal cell in past ) -- all ok Dr Phillip Heal   Allergies: 1)  ! * Mucinex Dm 2)  Ceftin 3)  Epinephrine 4)  Cipro 5)  Norvasc 6)  Septra 7)  * Altace 8)  * Antihistamines 9)  * Toprol 10)  Lipitor  Past History:  Past Surgical History: Last updated: 09/04/08 Hysterectomy (1982)-- for large fibroids and bleeding  BSO (1986) Right hip graft to left arm Appendectomy Colonoscopy- polyp (03/1996) Breast reduction Dexa- normal (10/1999), normal (12/2002), normal (11/2004) Colonoscopy- hemorrhoids, divertics, polyps (12/20070 Stress cardiolite- neg (11/2003) Hemorrhoidectomy (2006) Colonoscopy- tics, hemorrhoids (11/2006) EGD- polyps  Family History: Last updated: 2008-09-04 Father: deceased age 77- CAD Mother: deceased age 74- colon cancer Siblings: 2  brothers- 35 with DM. 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 Eyes:  Denies blurring and eye pain. CV:  Denies chest pain or discomfort, palpitations, shortness of breath with exertion, and swelling of feet. Resp:  Denies cough and wheezing. GI:  Denies abdominal pain, bloody stools, and change in bowel habits. GU:  Denies abnormal vaginal bleeding, discharge, and dysuria. MS:  Complains of stiffness; denies muscle weakness. Derm:  Denies lesion(s), poor wound healing, and rash. Neuro:  Denies headaches, numbness, and tingling. Psych:  has been tired and down lately. Endo:  Denies excessive thirst and excessive urination. Heme:  Denies abnormal bruising and bleeding.  Physical Exam  General:  overweight but generally well appearing  Head:  normocephalic, atraumatic, and no abnormalities observed.   Eyes:  vision grossly intact, pupils equal, pupils round, and pupils reactive to light.  no conjunctival pallor, injection or icterus  Ears:  R ear normal and L ear normal.  scant cerumen Nose:  no nasal discharge.   Mouth:  pharynx pink and moist.   Neck:  supple with full rom and no masses or thyromegally, no JVD or carotid bruit  Breasts:  No mass, nodules, thickening, tenderness, bulging, retraction, inflamation, nipple discharge or skin changes noted.  (breast reduction scars noted) Lungs:  Normal respiratory effort, chest  expands symmetrically. Lungs are clear to auscultation, no crackles or wheezes. Heart:  Normal rate and regular rhythm. S1 and S2 normal without gallop, murmur, click, rub or other extra sounds. Abdomen:  Bowel sounds positive,abdomen soft and non-tender without masses, organomegaly or hernias noted. no renal bruits  Msk:  No deformity or scoliosis noted of thoracic or lumbar spine.  no acute joint change  Pulses:  R and L carotid,radial,femoral,dorsalis pedis and posterior tibial pulses are full and equal bilaterally Extremities:  No clubbing, cyanosis, edema, or deformity noted with normal full range of motion of all joints.   Neurologic:  sensation intact to light touch, gait normal, and DTRs symmetrical and normal.   Skin:  Intact without suspicious lesions or rashes fair with lentigos Cervical Nodes:  No lymphadenopathy noted Axillary Nodes:  No palpable lymphadenopathy Inguinal Nodes:  No significant adenopathy Psych:  nl affect- but seems generally fatigued   Impression & Recommendations:  Problem # 1:  HYPERTENSION, ESSENTIAL NOS (ICD-401.9) Assessment Unchanged  good control with avapro encouraged healthy diet and exercise for wt loss  keep up the walking labs today Her updated medication list for this problem includes:    Avapro 150 Mg Tabs (Irbesartan) ..... One by mouth once daily  Orders: Venipuncture (95621) TLB-Lipid Panel (80061-LIPID) TLB-Renal Function Panel (80069-RENAL) TLB-TSH (Thyroid Stimulating Hormone) (84443-TSH) TLB-Hepatic/Liver Function Pnl (80076-HEPATIC) TLB-CBC Platelet - w/Differential (85025-CBCD)  BP today: 126/70 Prior BP: 146/78 (04/01/2009)  Labs Reviewed: K+: 4.4 (08/20/2008) Creat: : 0.9 (08/20/2008)   Chol: 184 (08/20/2008)   HDL: 56.6 (08/20/2008)   LDL: 107 (08/20/2008)   TG: 104 (08/20/2008)  Problem # 2:  HYPERLIPIDEMIA (ICD-272.4) Assessment: Unchanged  well controlled with crestor and diet - did enc better compliance with low  sat fat diet  enc walking  lab today and update Her updated medication list for this problem includes:    Crestor 10 Mg Tabs (Rosuvastatin calcium) ..... One by mouth once daily  Orders: Venipuncture (30865) TLB-Lipid Panel (80061-LIPID) TLB-Renal Function Panel (80069-RENAL) TLB-TSH (Thyroid Stimulating Hormone) (84443-TSH) TLB-Hepatic/Liver Function Pnl (80076-HEPATIC) TLB-CBC Platelet - w/Differential (85025-CBCD)  Labs Reviewed: SGOT: 17 (08/20/2008)   SGPT: 12 (08/20/2008)   HDL:56.6 (08/20/2008), 61.0 (07/19/2007)  LDL:107 (08/20/2008), 101 (07/19/2007)  Chol:184 (08/20/2008), 186 (07/19/2007)  Trig:104 (08/20/2008), 120 (07/19/2007)  Problem # 3:  FATIGUE (ICD-780.79) Assessment: New  suspect multifactorial with many sick friends and family members - and high stress year  lab today  Orders: TLB-B12 + Folate Pnl (82746_82607-B12/FOL) T-Vitamin D (25-Hydroxy) (78469-62952) Specimen Handling (99000)  Complete Medication List: 1)  Avapro 150 Mg Tabs (Irbesartan) .... One by mouth once daily 2)  Crestor 10 Mg Tabs (Rosuvastatin calcium) .... One by mouth once daily 3)  Adult Aspirin Low Strength 81 Mg Tbdp (Aspirin) .... One by mouth q d 4)  Calcium 600 Mg Tabs (Calcium) .[name] KitchenMarland KitchenMarland Kitchen  Take 2 by mouth qd 5)  Glucosamine 500 Mg Caps (Glucosamine sulfate) .... Take 2 by mouth qd 6)  Tranxene-t 7.5 Mg Tabs (Clorazepate dipotassium) .[name] Kitchen.. 1 by mouth once daily as needed 7)  [facility]vs Magnesium 250 Mg Tabs (Magnesium) .... As directed  Patient Instructions: 1)  if you are interested in shingles vaccine in future - let me know (check with your insurance) 2)  keep up the good exercise  3)  work on healthy low saturated fat diet , and weight loss 4)  labs today 5)  the current recommendation for calcium intake is 1200-1500 mg daily with [phone] IU of vitamin D  Prescriptions: TRANXENE-T 7.5 MG  TABS (CLORAZEPATE DIPOTASSIUM) 1 by mouth once daily as needed  #30 x 0   Entered and Authorized  by:   Allena Earing MD   Signed by:   Allena Earing MD on 08/22/2009   Method used:   Print then Give to Patient   RxID:   [id number] CRESTOR 10 MG  TABS (ROSUVASTATIN CALCIUM) one by mouth once daily  #90 x 3   Entered and Authorized by:   Allena Earing MD   Signed by:   Allena Earing MD on 08/22/2009   Method used:   Print then Give to Patient   RxID:   [id number] AVAPRO 150 MG  TABS (IRBESARTAN) one by mouth once daily  #90 x 3   Entered and Authorized by:   Allena Earing MD   Signed by:   Allena Earing MD on 08/22/2009   Method used:   Print then Give to Patient   RxID:   [id number]   Prior Medications (reviewed today): CRESTOR 10 MG  TABS (ROSUVASTATIN CALCIUM) one by mouth once daily ADULT ASPIRIN LOW STRENGTH 81 MG  TBDP (ASPIRIN) one by mouth q d CALCIUM 600 MG  TABS (CALCIUM) take 2 by mouth qd GLUCOSAMINE 500 MG  CAPS (GLUCOSAMINE SULFATE) take 2 by mouth qd TRANXENE-T 7.5 MG  TABS (CLORAZEPATE DIPOTASSIUM) 1 by mouth once daily as needed [facility] MAGNESIUM 250 MG  TABS (MAGNESIUM) as directed Current Allergies: ! * MUCINEX DM CEFTIN EPINEPHRINE CIPRO NORVASC SEPTRA * ALTACE * ANTIHISTAMINES * TOPROL LIPITOR Current Medications (including changes made in today's visit):  AVAPRO 150 MG  TABS (IRBESARTAN) one by mouth once daily CRESTOR 10 MG  TABS (ROSUVASTATIN CALCIUM) one by mouth once daily ADULT ASPIRIN LOW STRENGTH 81 MG  TBDP (ASPIRIN) one by mouth q d CALCIUM 600 MG  TABS (CALCIUM) take 2 by mouth qd GLUCOSAMINE 500 MG  CAPS (GLUCOSAMINE SULFATE) take 2 by mouth qd TRANXENE-T 7.5 MG  TABS (CLORAZEPATE DIPOTASSIUM) 1 by mouth once daily as needed [facility] MAGNESIUM 250 MG  TABS (MAGNESIUM) as directed    Preventive Care Screening  Last Flu Shot:    Date:  07/24/2009    Results:  given

## 2010-12-25 NOTE — Progress Notes (Signed)
Summary: refill request for crestor  Phone Note Refill Request Message from:  Fax from Pharmacy  Refills Requested: Medication #1:  CRESTOR 10 MG  TABS one by mouth once daily Faxed form from [facility] is on your shelf.  Initial call taken by: Marty Heck,  April 30, 2009 10:26 AM  Follow-up for Phone Call        px printed out for fax   Follow-up by: Allena Earing MD,  April 30, 2009 11:31 AM  Additional Follow-up for Phone Call Additional follow up Details #1::        Rx faxed to pharmacy Additional Follow-up by: Daralene Milch,  April 30, 2009 12:14 PM    New/Updated Medications: CRESTOR 10 MG  TABS (ROSUVASTATIN CALCIUM) one by mouth once daily   Prescriptions: CRESTOR 10 MG  TABS (ROSUVASTATIN CALCIUM) one by mouth once daily  #90 x 3   Entered and Authorized by:   Allena Earing MD   Signed by:   Allena Earing MD on 04/30/2009   Method used:   Printed then faxed to ...       [facility]  [address] (retail)       [address]       [place], [place]  [address]       Ph: [phone] or [phone]       Fax: [phone]   RxID:   [id number]

## 2010-12-25 NOTE — Progress Notes (Signed)
Summary: clorazepate refill request  Phone Note From Pharmacy   Caller: [facility] Call For: dr Arne Schlender  Summary of Call: faxed refill request for  clorazepate 7.5 mg.  last filled 11/07/07 Initial call taken by: Marty Heck,  April 23, 2008 11:29 AM  Follow-up for Phone Call        px written on EMR for call in  Follow-up by: Allena Earing MD,  April 23, 2008 2:03 PM  Additional Follow-up for Phone Call Additional follow up Details #1::        called to [facility] Additional Follow-up by: Marty Heck,  April 23, 2008 2:23 PM      Prescriptions: TRANXENE-T 7.5 MG  TABS (CLORAZEPATE DIPOTASSIUM) 1 by mouth once daily prn  #30 x 0   Entered and Authorized by:   Allena Earing MD   Signed by:   Marty Heck on 04/23/2008   Method used:   Telephoned to ...         RxID:   [id number]

## 2010-12-25 NOTE — Letter (Signed)
Summary: [facility]  [facility]   Imported By: Laural Benes 12/12/2010 14:35:54  _____________________________________________________________________  External Attachment:    Type:   Image     Comment:   External Document

## 2010-12-25 NOTE — Consult Note (Signed)
Summary: [facility]  [facility]   Imported By: Edmonia James 12/02/2010 11:23:36  _____________________________________________________________________  External Attachment:    Type:   Image     Comment:   External Document

## 2010-12-25 NOTE — Assessment & Plan Note (Signed)
Summary: Tammy Manning B12/[facility]H  Nurse Visit   Allergies: 1)  ! * Mucinex Dm 2)  Ceftin 3)  Epinephrine 4)  Cipro 5)  Norvasc 6)  Septra 7)  * Altace 8)  * Antihistamines 9)  * Toprol 10)  Lipitor  Medication Administration  Injection # 1:    Medication: Vit B12 1000 mcg    Diagnosis: B12 DEFICIENCY (ICD-266.2)    Route: IM    Site: L deltoid    Exp Date: 08/23/2012    Lot #: [id number]    Mfr: [employer]    Patient tolerated injection without complications    Given by: Ozzie Hoyle LPN (November 05, 3148 2:31 PM)  Orders Added: 1)  Vit B12 1000 mcg [J3420] 2)  Admin of Therapeutic Inj  intramuscular or subcutaneous [70263]

## 2010-12-25 NOTE — Letter (Signed)
Summary: Results Follow up Letter  [place] [facility]  [address] [place], [place] [address]   Phone: [phone]  Fax: [phone]    04/21/2007 MRN: [id number]  [place], [place]  [address]  Dear [name],  The following are the results of your recent test(s):  Test         Result    Pap Smear:        Normal _____  Not Normal _____ Comments: ______________________________________________________ Cholesterol: LDL(Bad cholesterol):         Your goal is less than:         HDL (Good cholesterol):       Your goal is more than: Comments:  ______________________________________________________ Mammogram:        Normal _____  Not Normal _____ Comments:  ___________________________________________________________________ Hemoccult:        Normal _____  Not normal _______ Comments:    _____________________________________________________________________ Other Tests:    We routinely do not discuss normal results over the telephone.  If you desire a copy of the results, or you have any questions about this information we can discuss them at your next office visit.   Sincerely,

## 2010-12-25 NOTE — Assessment & Plan Note (Signed)
Summary: BAD COLD  CYD   Vital Signs:  Patient profile:   76 year old female Height:      64.25 inches Weight:      215.75 pounds BMI:     36.88 Temp:     98.4 degrees F oral Pulse rate:   76 / minute Pulse rhythm:   regular BP sitting:   146 / 78  (left arm) Cuff size:   large  Vitals Entered By: Ozzie Hoyle (Apr 01, 2009 10:49 AM)  History of Present Illness: Chief complaint: bad cold  Here for URI--nasal congestion, ST--now mucus thicker and yellow --onset x 1+ wk --new cough --going on vacation mid-week --taking mucinexs two times a day   Allergies: 1)  ! * Mucinex Dm 2)  Ceftin 3)  Epinephrine 4)  Cipro 5)  Norvasc 6)  Septra 7)  * Altace 8)  * Antihistamines 9)  * Toprol 10)  Lipitor  Review of Systems      See HPI  Physical Exam  General:  alert, well-developed, well-nourished, and well-hydrated.   Eyes:  pupils equal, pupils round, and no injection.   Ears:  R ear normal and L ear normal.   Nose:  no airflow obstruction, mucosal erythema, and mucosal edema.  sinuses +,- Mouth:  no exudates and pharyngeal erythema.   Lungs:  moist harsh cough Cervical Nodes:  no anterior cervical adenopathy and no posterior cervical adenopathy.   Psych:  normally interactive and good eye contact.     Impression & Recommendations:  Problem # 1:  BRONCHITIS-ACUTE (ICD-466.0) Assessment New continue comfort care measures: increase po fluids, rest, tylenol or IBP as needed will start on zpac will use tussinex at hs as needed --has see back in 7-10d if not improved  The following medications were removed from the medication list:    Zithromax Z-pak 250 Mg Tabs (Azithromycin) .[name] Kitchen... Take by mouth as directed    Tussionex Pennkinetic Er 8-10 Mg/60m Lqcr (Chlorpheniramine-hydrocodone) ..[name] Kitchen.. 1/2 to 1 tsp by mouth two times a day as needed cough    Proventil Hfa 108 (90 Base) Mcg/act Aers (Albuterol sulfate) ..[name] Kitchen.. 2 puffs up to every 4 hours as needed wheezing or  tightness Her updated medication list for this problem includes:    Zithromax Z-pak 250 Mg Tabs (Azithromycin) ..... Use as directed  Complete Medication List: 1)  Avapro 150 Mg Tabs (Irbesartan) .... One by mouth once daily 2)  Crestor 10 Mg Tabs (Rosuvastatin calcium) .... One by mouth once daily 3)  Adult Aspirin Low Strength 81 Mg Tbdp (Aspirin) .... One by mouth q d 4)  Calcium 600 Mg Tabs (Calcium) .... Take 2 by mouth qd 5)  Glucosamine 500 Mg Caps (Glucosamine sulfate) .... Take 2 by mouth qd 6)  Tranxene-t 7.5 Mg Tabs (Clorazepate dipotassium) ..[name] Kitchen. 1 by mouth once daily as needed 7)  Cvs Magnesium 250 Mg Tabs (Magnesium) .... As directed 8)  Zithromax Z-pak 250 Mg Tabs (Azithromycin) .... Use as directed Prescriptions: ZITHROMAX Z-PAK 250 MG TABS (AZITHROMYCIN) use as directed  #1 x 0   Entered and Authorized by:   BDixie DialsFNP   Signed by:   BDixie DialsFNP on 04/01/2009   Method used:   Print then Give to Patient   RxID::   [id number]    Current Allergies (reviewed today): ! * MUCINEX DM CEFTIN EPINEPHRINE CIPRO NORVASC SEPTRA * ALTACE * ANTIHISTAMINES * TOPROL LIPITOR

## 2010-12-25 NOTE — Assessment & Plan Note (Signed)
Summary: roa, wants labs/alc   Vital Signs:  Patient profile:   76 year old female Height:      64.25 inches Weight:      213.75 pounds BMI:     36.54 Temp:     97.5 degrees F oral Pulse rate:   72 / minute Pulse rhythm:   regular BP sitting:   118 / 70  (left arm) Cuff size:   large  Vitals Entered By: Ozzie Hoyle LPN (September 24, 3761 9:24 AM) CC: wants labs   History of Present Illness: wt is down 4 lb has been taking care of herself  is doing knee and stretching exercise   doing kegels and that has helped her incontinence wears pad a lot less often  is feeling ok overall   has a skin cancer on her R eyelid -- basal cell -- is scheduled for removal - is nervous about that   sees chiropractor for back and neck  headaches are worse lately - tylenol helps  has headaches every day at this point  past 2 years a lot worse  has never seen a headache specialist      here to f/u for HTN and lipids   bp great control 118/70  on crestor for chol Last Lipid ProfileCholesterol: 170 (08/22/2009 10:09:41 AM)HDL:  53.90 (08/22/2009 10:09:41 AM)LDL:  90 (08/22/2009 10:09:41 AM)Triglycerides:  Last Liver profileSGOT:  16 (08/22/2009 10:09:41 AM)SPGT:  12 (08/22/2009 10:09:41 AM)T. Bili:  0.6 (08/22/2009 10:09:41 AM)Alk Phos:  54 (08/22/2009 10:09:41 AM)   due for a check   Allergies: 1)  ! * Mucinex Dm 2)  Ceftin 3)  Epinephrine 4)  Cipro 5)  Norvasc 6)  Septra 7)  * Altace 8)  * Antihistamines 9)  * Toprol 10)  Lipitor  Past History:  Past Surgical History: Last updated: 23-Aug-2008 Hysterectomy (1982)-- for large fibroids and bleeding  BSO (1986) Right hip graft to left arm Appendectomy Colonoscopy- polyp (03/1996) Breast reduction Dexa- normal (10/1999), normal (12/2002), normal (11/2004) Colonoscopy- hemorrhoids, divertics, polyps (12/20070 Stress cardiolite- neg (11/2003) Hemorrhoidectomy (2006) Colonoscopy- tics, hemorrhoids (11/2006) EGD-  polyps  Family History: Last updated: 08/23/2008 Father: deceased age 42- CAD Mother: deceased age 75- colon cancer Siblings: 2 brothers- 52 with DM. 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 Eyes:  Denies blurring and eye irritation. CV:  Denies chest pain or discomfort and lightheadness. Resp:  Denies cough and shortness of breath. GI:  Denies abdominal pain, bloody stools, and change in bowel habits. GU:  Denies discharge, dysuria, and urinary frequency. MS:  Complains of mid back pain; denies joint redness and joint swelling. Derm:  Complains of lesion(s); denies itching and rash. Neuro:  Complains of headaches; denies difficulty with concentration, disturbances in  coordination, tingling, tremors, and weakness. Psych:  mood has been ok . Endo:  Denies cold intolerance, excessive thirst, excessive urination, and heat intolerance. Heme:  Denies abnormal bruising and bleeding.  Physical Exam  General:  overweight but generally well appearing  Head:  normocephalic, atraumatic, and no abnormalities observed.   Eyes:  vision grossly intact, pupils equal, pupils round, and pupils reactive to light.  no conjunctival pallor, injection or icterus  Mouth:  pharynx pink and  moist.   Neck:  supple with full rom and no masses or thyromegally, no JVD or carotid bruit  nl rom  Chest Wall:  No deformities, masses, or tenderness noted. Lungs:  Normal respiratory effort, chest expands symmetrically. Lungs are clear to auscultation, no crackles or wheezes. Heart:  Normal rate and regular rhythm. S1 and S2 normal without gallop, murmur, click, rub or other extra sounds. Abdomen:  soft, non-tender, and normal bowel sounds.  no renal bruits  Msk:  No deformity or scoliosis noted of thoracic or lumbar spine.   Pulses:  R and L carotid,radial,femoral,dorsalis pedis and posterior tibial pulses are full and equal bilaterally Extremities:  No clubbing, cyanosis, edema, or deformity noted with normal full range of motion of all joints.   Neurologic:  alert & oriented X3, cranial nerves II-XII intact, strength normal in all extremities, sensation intact to light touch, gait normal, DTRs symmetrical and normal, toes down bilaterally on Babinski, and Romberg negative.   Skin:  Intact without suspicious lesions or rashes Cervical Nodes:  No lymphadenopathy noted Inguinal Nodes:  No significant adenopathy Psych:  normal affect, talkative and pleasant    Impression & Recommendations:  Problem # 1:  HEADACHE, CHRONIC (ICD-784.0) Assessment New lifelong and worsening with analgesic rebound ref to ha center disc lifestyle habits  Her updated medication list for this problem includes:    Adult Aspirin Low Strength 81 Mg Tbdp (Aspirin) ..... One by mouth daily  Orders: Headache Clinic Referral (Headache)  Problem # 2:  HYPERTENSION, ESSENTIAL NOS (ICD-401.9) Assessment: Unchanged  bp well controlled lab today disc healthy diet (low simple sugar/ choose complex carbs/ low sat fat) diet and exercise in detail  Her updated medication list for this problem includes:    Avapro 150 Mg Tabs (Irbesartan) ..... One by mouth once daily  Orders: Venipuncture (00867) TLB-Lipid Panel  (80061-LIPID) TLB-Renal Function Panel (80069-RENAL) TLB-CBC Platelet - w/Differential (85025-CBCD) TLB-Hepatic/Liver Function Pnl (80076-HEPATIC) TLB-TSH (Thyroid Stimulating Hormone) (84443-TSH)  BP today: 118/70 Prior BP: 128/72 (08/04/2010)  Labs Reviewed: K+: 4.1 (08/22/2009) Creat: : 0.8 (08/22/2009)   Chol: 170 (08/22/2009)   HDL: 53.90 (08/22/2009)   LDL: 90 (08/22/2009)   TG: 130.0 (08/22/2009)  Problem # 3:  HYPERLIPIDEMIA (ICD-272.4) Assessment: Unchanged  on crestor and niacin and low fat diet  rev low sat fat diet and plan for exercise lab today Her updated medication list for this problem includes:    Crestor 10 Mg Tabs (Rosuvastatin calcium) ..... One by mouth once daily    Niacin 250 Mg Tabs (Niacin) .[name] Kitchen... Take 1 tablet by mouth once a day  Orders: Venipuncture (61950) TLB-Lipid Panel (80061-LIPID) TLB-Renal Function Panel (80069-RENAL) TLB-CBC Platelet - w/Differential (85025-CBCD) TLB-Hepatic/Liver Function Pnl (80076-HEPATIC) TLB-TSH (Thyroid Stimulating Hormone) (84443-TSH)  Labs Reviewed: SGOT: 16 (08/22/2009)   SGPT: 12 (08/22/2009)   HDL:53.90 (08/22/2009), 56.6 (08/20/2008)  LDL:90 (08/22/2009), 107 (08/20/2008)  Chol:170 (08/22/2009), 184 (08/20/2008)  Trig:130.0 (08/22/2009), 104 (08/20/2008)  Problem # 4:  B12 DEFICIENCY (ICD-266.2) Assessment: Unchanged shot today- to stay on q  6 week schedule Orders: Vit B12 1000 mcg (J3420) Admin of Therapeutic Inj  intramuscular or subcutaneous (17915)  Complete Medication List: 1)  Avapro 150 Mg Tabs (Irbesartan) .... One by mouth once daily 2)  Crestor 10 Mg Tabs (Rosuvastatin calcium) .... One by mouth once daily 3)  Adult Aspirin Low Strength 81 Mg Tbdp (Aspirin) .... One by mouth daily 4)  Calcium 600 Mg Tabs (Calcium) .... Take 2 by mouth daily 5)  Glucosamine 500 Mg Caps (Glucosamine sulfate) .... Take 2 by mouth daily 6)  Tranxene-t 7.5 Mg Tabs (Clorazepate dipotassium) .[name] Kitchen.. 1 by mouth once daily  as needed 7)  Cvs Magnesium 250 Mg Tabs (Magnesium) .... As directed 8)  Niacin 250 Mg Tabs (Niacin) .... Take 1 tablet by mouth once a day 9)  Omeprazole 20 Mg Cpdr (Omeprazole) .[name] Kitchen.. 1 by mouth once daily in am about 30 minutes before breakfast 10)  Triamcinolone Acetonide 0.1 % Oint (Triamcinolone acetonide) .... Apply to affected areas once daily as needed (not more than twice per week) 11)  Calcium 1200-1000 Mg-unit Chew (Calcium carbonate-vit d-min) .... Take 1 tablet by mouth once a day 12)  Vitamin D 1000 Unit Tabs (Cholecalciferol) .... Take 1 tablet by mouth once a day 13)  Cyanocobalamin 1000 Mcg/ml Soln (Cyanocobalamin) .... One ml im every 6 weeks  Patient Instructions: 1)  we will do referral to headache center at check out  2)  stay active and eat a healthy low fat diet  3)  labs today   Medication Administration  Injection # 1:    Medication: Vit B12 1000 mcg    Diagnosis: B12 DEFICIENCY (ICD-266.2)    Route: IM    Site: R deltoid    Exp Date: 02/22/2012    Lot #: [id number]    Mfr: [employer]    Patient tolerated injection without complications    Given by: Ozzie Hoyle LPN (September 25, 7947 10:00 AM)  Orders Added: 1)  Venipuncture [01655] 2)  TLB-Lipid Panel [80061-LIPID] 3)  TLB-Renal Function Panel [80069-RENAL] 4)  TLB-CBC Platelet - w/Differential [85025-CBCD] 5)  TLB-Hepatic/Liver Function Pnl [80076-HEPATIC] 6)  TLB-TSH (Thyroid Stimulating Hormone) [84443-TSH] 7)  Vit B12 1000 mcg [J3420] 8)  Admin of Therapeutic Inj  intramuscular or subcutaneous [96372] 9)  Headache Clinic Referral [Headache] 10)  Est. Patient Level IV [37482]   Immunization History:  Influenza Immunization History:    Influenza:  fluvax 3+ (08/06/2010)   Immunization History:  Influenza Immunization History:    Influenza:  Fluvax 3+ (08/06/2010)  Current Allergies (reviewed today): ! * MUCINEX DM CEFTIN EPINEPHRINE CIPRO NORVASC SEPTRA * ALTACE * ANTIHISTAMINES *  LMBEML [id number]

## 2010-12-25 NOTE — Progress Notes (Signed)
Summary: BURNING ON LEG-FELL AND INJ LEG  Phone Note Call from Patient   Summary of Call: PT HAS A SCHEDULED APPT ON TOMORROW...SAYS SHE FELL ON LAST WEEK, SHE IS EXPERIENING SOME BURNING ON THE BACK OF HER CALF AREA, NO SWELLING, SOME BRUISING.  SHE WANTS TO KNOW IF IT CAN WAIT UNTIL TOMORROW.Allen Norris  October 30, 2008 9:56 AM PT [facility] # [phone] Initial call taken by: Allen Norris,  October 30, 2008 9:56 AM  Follow-up for Phone Call        if no redness or fever - can follow up tomorrow  update me if any new symptoms  Follow-up by: Allena Earing MD,  October 30, 2008 1:34 PM  Additional Follow-up for Phone Call Additional follow up Details #1::        Advised patient.  No new symptoms......................................................[name] KitchenDaralene Milch October 30, 2008 4:28 PM

## 2010-12-25 NOTE — Assessment & Plan Note (Signed)
Summary: Cold; Bronchial Symptoms   Vital Signs:  Patient Profile:   76 Years Old Female Height:     64.25 inches Weight:      219 pounds BMI:     37.43 Temp:     97.5 degrees F oral Pulse rate:   84 / minute Pulse rhythm:   regular BP sitting:   130 / 80  (left arm) Cuff size:   large  Vitals Entered By: Daralene Milch (November 21, 2008 9:45 AM)                 Chief Complaint:  cold and Bronchitis?/.  History of Present Illness: started getting sick on monday started out with a sore throat - followed by nasal symptoms  very bad headache (common for her) cough then started yesterday afternoon  last nt took tea and honey and liquor - and still could not sleep/ did help cough a bit  cough is not very productive   yesterday - nasal d/c is yellow color  has had a fever - 100.3 maximum chills and could not get warm  also really achey   no n/v/d at all   has had 2 falls this year - both accidental trip and falls    Current Allergies: ! 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               chiropactor        ortho -- Dr Tonita Cong       GI -- Dr Allyn Kenner   Past Surgical History:    Reviewed history from 08/20/2008 and no changes required:       Hysterectomy (1982)-- for large fibroids and bleeding        BSO (1986)       Right hip graft to left arm       Appendectomy       Colonoscopy- polyp (03/1996)       Breast reduction       Dexa- normal (10/1999), normal (12/2002), normal (11/2004)       Colonoscopy- hemorrhoids, divertics, polyps (12/20070       Stress cardiolite- neg (11/2003)       Hemorrhoidectomy (2006)       Colonoscopy- tics, hemorrhoids (11/2006)       EGD- polyps   Family History:  Reviewed history from 08/20/2008 and no changes required:       Father: deceased age 67- CAD       Mother: deceased age 14- colon cancer       Siblings: 2 brothers- 84 with DM. 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  Eyes      Denies blurring, discharge, eye irritation, and eye pain.  ENT      Complains of earache and hoarseness.      Denies ear discharge.  CV      Denies chest pain or discomfort and palpitations.      mucinex DM did make her hyper  Resp      Denies pleuritic.  Derm      Denies rash.   Physical Exam  General:     alert, well-developed, well-nourished, and overweight-appearing.  NAD Head:     no sinus tenderness normocephalic, atraumatic, and no abnormalities observed.   Eyes:     vision grossly intact, pupils equal, pupils round, pupils reactive to light, and no injection.   Ears:     R ear normal and L ear normal.   Nose:     nares are mildly congested and injected  Mouth:     pharynx pink and moist, no erythema, and no exudates.   Neck:     supple with full rom and no masses or thyromegally, no JVD or carotid bruit  Chest Wall:     No deformities, masses, or tenderness noted. Lungs:     harsh bs at bases with occ end exp wheeze few scattered rhonchi, no rales or crackles good air exch Heart:     Normal rate and regular rhythm. S1 and S2 normal without gallop, murmur, click, rub or other extra sounds. Skin:     Intact without suspicious lesions or rashes Cervical Nodes:     No lymphadenopathy noted Psych:     normal affect, talkative and  pleasant     Impression & Recommendations:  Problem # 1:  BRONCHITIS-ACUTE (ICD-466.0) Assessment: New with slt reactive airway symptoms tx with zithromax - update tussionex for cough with caution proventil as needed wheeze- adv to update if inc wheeze or sob pt advised to update me if symptoms worsen or do not improve  Her updated medication list for this problem includes:    Zithromax Z-pak 250 Mg Tabs (Azithromycin) .[name] Kitchen... Take by mouth as directed    Tussionex Pennkinetic Er 8-10 Mg/54m Lqcr (Chlorpheniramine-hydrocodone) ..[name] Kitchen.. 1/2 to 1 tsp by mouth two times a day as needed cough    Proventil Hfa 108 (90 Base) Mcg/act Aers (Albuterol sulfate) ..[name] Kitchen.. 2 puffs up to every 4 hours as needed wheezing or tightness   Complete Medication List: 1)  Avapro 150 Mg Tabs (Irbesartan) .... One by mouth once daily 2)  Crestor 10 Mg Tabs (Rosuvastatin calcium) .... One by mouth once daily 3)  Adult Aspirin Low Strength 81 Mg Tbdp (Aspirin) .... One by mouth q d 4)  Calcium 600 Mg Tabs (Calcium) .... Take 2 by mouth qd 5)  Glucosamine 500 Mg Caps (Glucosamine sulfate) .... Take 2 by mouth qd 6)  Tranxene-t 7.5 Mg Tabs (Clorazepate dipotassium) ..[name] Kitchen. 1 by mouth once daily as needed 7)  Cvs Magnesium 250 Mg Tabs (Magnesium) .... As directed 8)  Probiotic Tabs  .... Daily 9)  Zithromax Z-pak 250 Mg Tabs (Azithromycin) .... Take by mouth as directed 10)  Tussionex Pennkinetic Er 8-10 Mg/554mLqcr (Chlorpheniramine-hydrocodone) .... 1/2 to 1 tsp by mouth two times a day as needed cough 11)  Proventil Hfa 108 (90 Base) Mcg/act  Aers (Albuterol sulfate) .... 2 puffs up to every 4 hours as needed wheezing or tightness   Patient Instructions: 1)  you can try mucinex over the counter twice daily as directed and nasal saline spray for congestion 2)  tylenol over the counter as directed may help with aches, headache and fever 3)  call if symptoms worsen or if not improved in 4-5 days  4)  take the zirthromax  for bronchitis 5)  use the tussionex as needed cough- watch for sedation  6)  use the proventil inhaler as needed - if wheezy or tight    Prescriptions: PROVENTIL HFA 108 (90 BASE) MCG/ACT AERS (ALBUTEROL SULFATE) 2 puffs up to every 4 hours as needed wheezing or tightness  #1 mdi x 0   Entered and Authorized by:   Allena Earing MD   Signed by:   Allena Earing MD on 11/21/2008   Method used:   Print then Give to Patient   RxID:   [id number] [facility] ER 8-10 MG/5ML LQCR (CHLORPHENIRAMINE-HYDROCODONE) 1/2 to 1 tsp by mouth two times a day as needed cough  #8 oz x 0   Entered and Authorized by:   Allena Earing MD   Signed by:   Allena Earing MD on 11/21/2008   Method used:   Print then Give to Patient   RxID:   [id number] ZITHROMAX Z-PAK 250 MG TABS (AZITHROMYCIN) take by mouth as directed  #1 pack x 0   Entered and Authorized by:   Allena Earing MD   Signed by:   Allena Earing MD on 11/21/2008   Method used:   Print then Give to Patient   RxID:   [id number]  ]

## 2010-12-25 NOTE — Assessment & Plan Note (Signed)
Summary: COLD,EAR,ST/CLE   Vital Signs:  Patient Profile:   76 Years Old Female Height:     64.25 inches Weight:      221 pounds Temp:     98.5 degrees F oral Pulse rate:   81 / minute BP sitting:   131 / 73  (left arm) Cuff size:   large  Vitals Entered By: Glenna Durand (December 01, 2007 10:57 AM)                 Chief Complaint:  URI sx and ears clogged.  History of Present Illness: Here for URI sx--ears full, severe ST--hurts to talk and to swallow, no fever/chills, lost voice 2 dago.  Current Allergies (reviewed today): CEFTIN EPINEPHRINE CIPRO NORVASC SEPTRA * ALTACE * ANTIHISTAMINES * TOPROL LIPITOR Updated/Current Medications (including changes made in today's visit):  AVAPRO 150 MG  TABS (IRBESARTAN) one by mouth q d CRESTOR 10 MG  TABS (ROSUVASTATIN CALCIUM) one by mouth q d CENTRUM SILVER   TABS (MULTIPLE VITAMINS-MINERALS) one by mouth q d ADULT ASPIRIN LOW STRENGTH 81 MG  TBDP (ASPIRIN) one by mouth q d CALCIUM 600 MG  TABS (CALCIUM) take 2 by mouth qd GLUCOSAMINE 500 MG  CAPS (GLUCOSAMINE SULFATE) take 2 by mouth qd TRANXENE-T 7.5 MG  TABS (CLORAZEPATE DIPOTASSIUM) 1 by mouth once daily prn [facility] MAGNESIUM 250 MG  TABS (MAGNESIUM) as directed BL ZINC-VITAMIN C LOZENGE 8-100 MG  LOZG (ZINC GLUCONATE-VITAMIN C) as directed AMOXICILLIN 500 MG  TABS (AMOXICILLIN) 2 bid      Review of Systems      See HPI   Physical Exam  General:     alert, well-developed, well-nourished, and overweight-appearing.  NAD Ears:     Tms retracted withincreased fluid bilat Nose:     eryth, edema sinuses +,- throughour Mouth:     injected with no exudate voice raspy and low in volumn Lungs:     moist harsh cough, normal respiratory effort, no crackles, and no wheezes.   Neurologic:     alert & oriented X3 and gait normal.   Skin:     turgor normal, color normal, and no rashes.   Cervical Nodes:     no anterior cervical adenopathy and no posterior cervical  adenopathy.   Psych:     normally interactive and good eye contact.      Impression & Recommendations:  Problem # 1:  PHARYNGITIS-ACUTE (QQI-297) Assessment: New will continue comfort care measures: increased by mouth fluids, rest, tylenol as needed will add Amoxicillin x7d see back 5-7d if not improved Her updated medication list for this problem includes:    Adult Aspirin Low Strength 81 Mg Tbdp (Aspirin) ..... One by mouth q d    Amoxicillin 500 Mg Tabs (Amoxicillin) .[name] Kitchen... 2 bid   Complete Medication List: 1)  Avapro 150 Mg Tabs (Irbesartan) .... One by mouth q d 2)  Crestor 10 Mg Tabs (Rosuvastatin calcium) .... One by mouth q d 3)  Centrum Silver Tabs (Multiple vitamins-minerals) .... One by mouth q d 4)  Adult Aspirin Low Strength 81 Mg Tbdp (Aspirin) .... One by mouth q d 5)  Calcium 600 Mg Tabs (Calcium) .... Take 2 by mouth qd 6)  Glucosamine 500 Mg Caps (Glucosamine sulfate) .... Take 2 by mouth qd 7)  Tranxene-t 7.5 Mg Tabs (Clorazepate dipotassium) .[name] Kitchen.. 1 by mouth once daily prn 8)  Cvs Magnesium 250 Mg Tabs (Magnesium) .... As directed 9)  Bl Zinc-vitamin C Lozenge 8-100  Mg Lozg (Zinc gluconate-vitamin c) .... As directed 10)  Amoxicillin 500 Mg Tabs (Amoxicillin) .... 2 bid     Prescriptions: AMOXICILLIN 500 MG  TABS (AMOXICILLIN) 2 bid  #28 x 0   Entered and Authorized by:   Dixie Dials FNP   Signed by:   Dixie Dials FNP on 12/01/2007   Method used:   Print then Give to Patient   RxID:   [id number]  ]

## 2010-12-25 NOTE — Progress Notes (Signed)
Summary: refill request for tranxene  Phone Note Refill Request Message from:  Fax from Pharmacy  Refills Requested: Medication #1:  TRANXENE-T 7.5 MG  TABS 1 by mouth once daily as needed   Last Refilled: 11/14/2008 faxed request from Ver[facility], phone [phone]  Initial call taken by: Marty Heck,  Apr 12, 2009 1:41 PM  Follow-up for Phone Call        px written on EMR for call in  Follow-up by: Allena Earing MD,  Apr 12, 2009 1:58 PM  Additional Follow-up for Phone Call Additional follow up Details #1::        [facility]. Additional Follow-up by: Marty Heck,  Apr 12, 2009 2:17 PM      Prescriptions: TRANXENE-T 7.5 MG  TABS (CLORAZEPATE DIPOTASSIUM) 1 by mouth once daily as needed  #30 x 0   Entered and Authorized by:   Allena Earing MD   Signed by:   Marty Heck on 04/12/2009   Method used:   Telephoned to ...       [facility] ([facility]ail)       [address]       [place], [place]  [address]       Ph: [phone] or [phone]       Fax: [phone]   RxID:   [id number]

## 2010-12-25 NOTE — Letter (Signed)
Summary: Results Follow up Letter  Elias-Fela Solis at [facility]  [address] [place], [place] [address]   Phone: [phone]  Fax: [phone]    04/02/2010 MRN: [id number]    [facility] [place], [place]  [address]    Dear Ms. [name],  The following are the results of your recent test(s):  Test         Result    Pap Smear:        Normal _____  Not Normal _____ Comments: ______________________________________________________ Cholesterol: LDL(Bad cholesterol):         Your goal is less than:         HDL (Good cholesterol):       Your goal is more than: Comments:  ______________________________________________________ Mammogram:        Normal __X___  Not Normal _____ Comments:  Yearly follow up is recommended.   ___________________________________________________________________ Hemoccult:        Normal _____  Not normal _______ Comments:    _____________________________________________________________________ Other Tests:    We routinely do not discuss normal results over the telephone.  If you desire a copy of the results, or you have any questions about this information we can discuss them at your next office visit.   Sincerely,    Marne A. Glori Bickers, M.D.  MAT:lsf

## 2010-12-25 NOTE — Assessment & Plan Note (Signed)
Summary: STOMACH/CLE   Vital Signs:  Patient profile:   76 year old female Height:      64.25 inches Weight:      215.25 pounds BMI:     36.79 Temp:     98.2 degrees F oral Pulse rate:   80 / minute Pulse rhythm:   regular BP sitting:   124 / 70  (left arm) Cuff size:   large  Vitals Entered By: Ozzie Hoyle LPN (March 17, 5408 8:11 PM) CC: Stomach feels acidy after eating   History of Present Illness: is having some stomach problems  after she eats -- feels like she is getting gassy in mid abdomen and some sensation of burning  worse if she eats something like tomato based sauce   no night time symptoms-- not a night eater no acid reflux unless she eats late - which is rare  no n/v no appetitie    this has been happening for a while - but gradually worsening  has tried some prilosec otc once in a while -- and this does help - ? should take it all the time  has tried zantac which did not work at [employer] uses antacids  very seldom takes nsaids   has ibs -- is the same -- constipation and diarrhea back and forth  no blood in stool colonosc was in 2007   some loss and stress lately - more than usual supplements do not bother her- but cannot tolerate B12 -- last level was 233   has felt sluggish in general - could also be stress related   Allergies: 1)  ! * Mucinex Dm 2)  Ceftin 3)  Epinephrine 4)  Cipro 5)  Norvasc 6)  Septra 7)  * Altace 8)  * Antihistamines 9)  * Toprol 10)  Lipitor  Past History:  Past Surgical History: Last updated: 2008-08-22 Hysterectomy (1982)-- for large fibroids and bleeding  BSO (1986) Right hip graft to left arm Appendectomy Colonoscopy- polyp (03/1996) Breast reduction Dexa- normal (10/1999), normal (12/2002), normal (11/2004) Colonoscopy- hemorrhoids, divertics, polyps (12/20070 Stress cardiolite- neg (11/2003) Hemorrhoidectomy (2006) Colonoscopy- tics, hemorrhoids (11/2006) EGD- polyps  Family History: Last updated:  Aug 22, 2008 Father: deceased age 69- CAD Mother: deceased age 20- colon cancer Siblings: 2 brothers- 51 with DM. 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 Eyes:  Denies blurring and eye irritation. ENT:  Denies sore throat. CV:  Denies chest pain or discomfort, palpitations, shortness of breath with exertion, and swelling of feet. Resp:  Denies cough and wheezing; no throat clearing . GI:  Complains of gas and indigestion; denies bloody stools, change in bowel habits, dark tarry stools, diarrhea, nausea, and vomiting. GU:  Denies dysuria and hematuria. MS:  Complains of stiffness; denies muscle aches and cramps. Derm:  Denies lesion(s), poor wound healing, and rash. Neuro:  Denies headaches. Psych:  Denies anxiety and depression; is stressed . Endo:  Denies excessive thirst and excessive urination. Heme:  Denies abnormal bruising and bleeding.  Physical Exam  General:  overweight but generally well appearing  Head:  normocephalic, atraumatic, and no abnormalities observed.   Eyes:  vision grossly intact, pupils equal, pupils round, and pupils reactive to light.  no conjunctival pallor, injection or icterus  Mouth:  pharynx pink and moist.   Neck:  supple with full rom and no masses or thyromegally, no JVD or carotid bruit  Chest Wall:  No deformities, masses, or tenderness noted. Lungs:  Normal  respiratory effort, chest expands symmetrically. Lungs are clear to auscultation, no crackles or wheezes. Heart:  Normal rate and regular rhythm. S1 and S2 normal without gallop, murmur, click, rub or other extra sounds. Abdomen:  Bowel sounds positive,abdomen soft and non-tender without masses, organomegaly or hernias noted. no renal bruits no murphy sign Msk:  no CVA tenderness  Extremities:  No clubbing, cyanosis, edema, or deformity noted with normal full range of motion of all joints.   Neurologic:  sensation intact to light touch, gait normal, and DTRs symmetrical and normal.   Skin:  Intact without suspicious lesions or rashes Cervical Nodes:  No lymphadenopathy noted Inguinal Nodes:  No significant adenopathy Psych:  nl affect - seems mildly fatigued today not tearful  talks easily about stress    Impression & Recommendations:  Problem # 1:  DYSPEPSIA (ICD-536.8) Assessment New with post prandial burning and worsening with acidic foods recent stress may also be a trigger  adv to start omeprazole daily for 2 mo then cut to every other day as tolerated disc wt loss need  disc diet change - less acidic/ spicy foods  f/u 3 mo   Problem # 2:  B12 DEFICIENCY (ICD-266.2) Assessment: New  mild B12 def with level in low 200s pt non tol to oral B vits will start shots Q 6-8 weeks - first one today hope this will help fatigue   Orders: Admin of Therapeutic Inj  intramuscular or subcutaneous (78295) Vit B12 1000 mcg (J3420)  Problem # 3:  STRESS REACTION, ACUTE, WITH EMOTIONAL DISTURBANCE (ICD-308.0) Assessment: New with loss/ grief -overall doing well disc coping skills   Complete Medication List: 1)  Avapro 150 Mg Tabs (Irbesartan) .... One by mouth once daily 2)  Crestor 10 Mg Tabs (Rosuvastatin calcium) .... One by mouth once daily 3)  Adult Aspirin Low Strength 81 Mg Tbdp (Aspirin) .... One by mouth daily 4)  Calcium 600 Mg Tabs (Calcium) .... Take 2 by mouth daily 5)   Glucosamine 500 Mg Caps (Glucosamine sulfate) .... Take 2 by mouth daily 6)  Tranxene-t 7.5 Mg Tabs (Clorazepate dipotassium) .[name] Kitchen.. 1 by mouth once daily as needed 7)  [facility] Magnesium 250 Mg Tabs (Magnesium) .... As directed 8)  Niacin 250 Mg Tabs (Niacin) .... Take 1 tablet by mouth once a day 9)  Fish Oil Oil (Fish oil) .... Take one daily by mouth 10)  Omeprazole 20 Mg Cpdr (Omeprazole) .[name] Kitchen.. 1 by mouth once daily in am about 30 minutes before breakfast  Patient Instructions: 1)  take omeprazole 20 mg daily in am -- for 2 months - then decrease to every other day  2)  avoid spicy and acidic foods  3)  B12 shot today  4)  schedule next B12 shot in 6 weeks  5)  follow up with me in 3 months  Prescriptions: OMEPRAZOLE 20 MG CPDR (OMEPRAZOLE) 1 by  mouth once daily in am about 30 minutes before breakfast  #30 x 5   Entered and Authorized by:   Allena Earing MD   Signed by:   Allena Earing MD on 03/17/2010   Method used:   Electronically to        [facility]. # [phone]* (retail)       [address]       [place], [place]  [address]       Ph: [phone]       Fax: [phone]   RxID:   [id number]   Current Allergies (reviewed today): ! * MUCINEX DM CEFTIN EPINEPHRINE CIPRO NORVASC SEPTRA * ALTACE * ANTIHISTAMINES * TOPROL LIPITOR   Medication Administration  Injection # 1:    Medication: Vit B12 1000 mcg    Diagnosis: B12 DEFICIENCY (ICD-266.2)    Route: IM    Site: L deltoid    Exp Date: 09/24/2011    Lot #: [id number]    Mfr: [employer] Regent    Patient tolerated injection without complications    Given by: Ozzie Hoyle LPN (March 17, 7669 1:10 PM)  Orders Added: 1)  Admin of Therapeutic Inj  intramuscular or subcutaneous [96372] 2)  Vit B12 1000 mcg [J3420] 3)  Est. Patient Level IV [03496]

## 2010-12-25 NOTE — Assessment & Plan Note (Signed)
Summary: 3MTH FOLLOW UP / LFW   Vital Signs:  Patient profile:   76 year old female Height:      64.25 inches Weight:      213 pounds BMI:     36.41 Temp:     97.9 degrees F oral Pulse rate:   76 / minute Pulse rhythm:   regular BP sitting:   120 / 76  (left arm) Cuff size:   large  Vitals Entered By: Ozzie Hoyle LPN (June 16, 6212 08:65 AM) CC: three month f/u   History of Present Illness: here for f/u of B12 def and dyspepsia and HTN and lipids  B12 shot today   is a little tired- doing a lot of painting  enjoys that  otherwise is feeling pretty good   wt is down 2 lb is working on wt loss -- keeps trying  is walking usually two times a day - depending on the weather   HTN in good control with bp 120/76 today  for B12 def (mild) with intol oral supp- started shots every 6-8 weeks (last one in june) is a little less tired - no longer falling asleep in the chair   her one complaint is that she has to wear a pad for urine leakage  this is irritative  has mixed incontinence and also urge incontinence  used some of her sister's triamcinolone .1% and that works very well  does not want to see a urologist  does kegel exercises   on crestor for lipids Last Lipid ProfileCholesterol: 170 (08/22/2009 10:09:41 AM)HDL:  53.90 (08/22/2009 10:09:41 AM)LDL:  90 (08/22/2009 10:09:41 AM)Triglycerides:  Last Liver profileSGOT:  16 (08/22/2009 10:09:41 AM)SPGT:  12 (08/22/2009 10:09:41 AM)T. Bili:  0.6 (08/22/2009 10:09:41 AM)Alk Phos:  54 (08/22/2009 10:09:41 AM)   started omeprazole at last visit  takes it every day - and is just fine   that is much better than it was  has learned that dairy products make it worse also pie crust bothers her   was interested in zoster vaccine and her insurance will not cover it    Allergies: 1)  ! * Mucinex Dm 2)  Ceftin 3)  Epinephrine 4)  Cipro 5)  Norvasc 6)  Septra 7)  * Altace 8)  * Antihistamines 9)  * Toprol 10)  Lipitor  Past  History:  Past Surgical History: Last updated: 31-Aug-2008 Hysterectomy (1982)-- for large fibroids and bleeding  BSO (1986) Right hip graft to left arm Appendectomy Colonoscopy- polyp (03/1996) Breast reduction Dexa- normal (10/1999), normal (12/2002), normal (11/2004) Colonoscopy- hemorrhoids, divertics, polyps (12/20070 Stress cardiolite- neg (11/2003) Hemorrhoidectomy (2006) Colonoscopy- tics, hemorrhoids (11/2006) EGD- polyps  Family History: Last updated: 31-Aug-2008 Father: deceased age 65- CAD Mother: deceased age 22- colon cancer Siblings: 2 brothers- 75 with DM. 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 Eyes:  Denies blurring and eye irritation. CV:  Denies chest pain or discomfort, palpitations, shortness of breath with exertion, and swelling of feet. Resp:  Denies cough, shortness of breath, and wheezing. GI:  Denies abdominal pain, change in bowel habits, and nausea. GU:  Complains of incontinence and urinary frequency; denies dysuria. MS:  Denies muscle aches and cramps. Derm:  Complains of rash; denies lesion(s) and poor wound healing. Neuro:  Denies headaches, numbness, and tingling. Psych:  mood is generally better. Endo:  Denies cold intolerance, excessive thirst, excessive urination,  and heat intolerance. Heme:  Denies abnormal bruising and bleeding.  Physical Exam  General:  overweight but generally well appearing  Head:  normocephalic, atraumatic, and no abnormalities observed.   Eyes:  vision grossly intact, pupils equal, pupils round, and pupils reactive to light.  no conjunctival pallor, injection or icterus  Mouth:  pharynx pink and moist.   Neck:  supple with full rom and no masses or thyromegally, no JVD or carotid bruit  Chest Wall:  No deformities, masses, or tenderness noted. Lungs:  Normal respiratory effort, chest expands symmetrically. Lungs are clear to auscultation, no crackles or wheezes. Heart:  Normal rate and regular rhythm. S1 and S2 normal without gallop, murmur, click, rub or other extra sounds. Abdomen:  no suprapubic tenderness or fullness felt  Msk:  no CVA tenderness  Pulses:  R and L carotid,radial,femoral,dorsalis pedis and posterior tibial pulses are full and equal bilaterally Extremities:  No clubbing, cyanosis, edema, or deformity noted with normal full range of motion of all joints.   Neurologic:  sensation intact to light touch, gait normal, and DTRs symmetrical and normal.   Skin:  Intact without suspicious lesions or rashes Cervical Nodes:  No lymphadenopathy noted Inguinal Nodes:  No significant adenopathy Psych:  normal affect, talkative and pleasant    Impression & Recommendations:  Problem # 1:  STRESS REACTION, ACUTE, WITH EMOTIONAL DISTURBANCE (ICD-308.0) Assessment Improved  this is overall improved with time/ grief  refil tranxene for as needed use- rarely needs it   Orders: Prescription Created Electronically [id number])  Problem # 2:  B12 DEFICIENCY (ICD-266.2) Assessment: Improved  B12 shot  continue schedule of every 6-8 weeks  does feel better with this   Orders: Admin of Therapeutic Inj  intramuscular or subcutaneous (67591) Vit B12 1000 mcg (M3846) Prescription Created Electronically [id number])  Problem #  3:  HYPERTENSION, ESSENTIAL NOS (ICD-401.9) Assessment: Unchanged  good control without change on avapro schedule 6 mo lab and f/u  Her updated medication list for this problem includes:    Avapro 150 Mg Tabs (Irbesartan) ..... One by mouth once daily  BP today: 120/76 Prior BP: 124/70 (03/17/2010)  Labs Reviewed: K+: 4.1 (08/22/2009) Creat: : 0.8 (08/22/2009)   Chol: 170 (08/22/2009)   HDL: 53.90 (08/22/2009)   LDL: 90 (08/22/2009)   TG: 130.0 (08/22/2009)  Orders: Prescription Created Electronically [phone])  Problem # 4:  UNSPEC SYMPTOM ASSOC W/FEMALE GENITAL ORGANS (ICD-625.9) Assessment: New  irritation from pad  pt does not wish to treat the incontinence right now  will use the triamcinolone oint no more than 2 times weekl- and update if not imp  plan to do ext genetalia exam at f/u  Orders: Prescription Created Electronically [phone])  Complete Medication List: 1)  Avapro 150 Mg Tabs (Irbesartan) .... One by mouth once daily 2)  Crestor 10 Mg Tabs (Rosuvastatin calcium) .... One by mouth once daily 3)  Adult  Aspirin Low Strength 81 Mg Tbdp (Aspirin) .... One by mouth daily 4)  Calcium 600 Mg Tabs (Calcium) .... Take 2 by mouth daily 5)  Glucosamine 500 Mg Caps (Glucosamine sulfate) .... Take 2 by mouth daily 6)  Tranxene-t 7.5 Mg Tabs (Clorazepate dipotassium) .[name] Kitchen.. 1 by mouth once daily as needed 7)  [facility]vs Magnesium 250 Mg Tabs (Magnesium) .... As directed 8)  Niacin 250 Mg Tabs (Niacin) .... Take 1 tablet by mouth once a day 9)  Omeprazole 20 Mg Cpdr (Omeprazole) .[name] Kitchen.. 1 by mouth once daily in am about 30 minutes before breakfast 10)  Triamcinolone Acetonide 0.1 % Oint (Triamcinolone acetonide) .... Apply to affected areas once daily as needed (not more than twice per week)  Patient Instructions: 1)  schedule fasting labs in 6 months and then follow up  2)  lipid/ast/alt/renal / cbc with diff/ tsh/ B12  401.1, 272 , B12 def  3)  no change in medicines   Prescriptions: OMEPRAZOLE 20 MG CPDR (OMEPRAZOLE) 1 by mouth once daily in am about 30 minutes before breakfast  #30 x 11   Entered and Authorized by:   Allena Earing MD   Signed by:   Allena Earing MD on 06/16/2010   Method used:   Electronically to        [facility]. # [phone]* (retail)       [address]       [place], [place]  [address]       Ph: [phone]       Fax: [phone]   RxID:   [id number] TRIAMCINOLONE ACETONIDE 0.1 % OINT (TRIAMCINOLONE ACETONIDE) apply to affected areas once daily as needed (not more than twice per week)  #1 medium x 0   Entered and Authorized by:   Allena Earing MD   Signed by:   Allena Earing MD on 06/16/2010   Method used:   Electronically to        [facility]. # [phone]* (retail)       [address]       [place], [place]  [address]       Ph: [phone]       Fax: [phone]   RxID:   [id number] TRANXENE-T 7.5 MG  TABS (CLORAZEPATE DIPOTASSIUM) 1 by mouth once daily as needed  #30 x 0   Entered and Authorized by:   Allena Earing MD   Signed by:   Allena Earing MD on 06/16/2010   Method used:   Print then Give to Patient   RxID:   [id number]   Current Allergies (reviewed today): ! * MUCINEX DM CEFTIN EPINEPHRINE CIPRO NORVASC SEPTRA * ALTACE * ANTIHISTAMINES * TOPROL LIPITOR       Medication Administration  Injection # 1:    Medication: Vit B12 1000 mcg    Diagnosis: B12 DEFICIENCY (ICD-266.2)    Route: IM    Site: L deltoid    Exp Date: 10/24/2011    Lot #: [id number]    Mfr: [employer] Regent    Patient tolerated injection without complications    Given by: Ozzie Hoyle LPN (June 16, 5630 49:70 PM)  Orders Added: 1)  Admin of Therapeutic Inj  intramuscular or subcutaneous [96372] 2)  Vit B12 1000 mcg [J3420] 3)  Est. Patient Level IV [26378] 4)  Prescription Created Electronically [phone]

## 2010-12-25 NOTE — Letter (Signed)
[facility]: [facility]   Imported By: Edmonia James 08/15/2010 11:58:10  _____________________________________________________________________  External Attachment:    Type:   Image     Comment:   External Document

## 2010-12-25 NOTE — Letter (Signed)
Summary: Generic Letter  [place] at [facility]  [address] [place], [place] [address]   Phone: [phone]  Fax: [phone]    05/20/2009    [name] [address],   [address]    Dear [name],   Your mammogram was normal, please repeat screening in one year.      Sincerely,   Daralene Milch

## 2010-12-25 NOTE — Assessment & Plan Note (Signed)
Summary: TOWERB/12RBH  Nurse Visit   Allergies: 1)  ! * Mucinex Dm 2)  Ceftin 3)  Epinephrine 4)  Cipro 5)  Norvasc 6)  Septra 7)  * Altace 8)  * Antihistamines 9)  * Toprol 10)  Lipitor  Medication Administration  Injection # 1:    Medication: Vit B12 1000 mcg    Diagnosis: B12 DEFICIENCY (ICD-266.2)    Route: IM    Site: R deltoid    Exp Date: 10/24/2011    Lot #: [id number]    Mfr: [employer] Regent    Patient tolerated injection without complications    Given by: Ozzie Hoyle LPN (July 29, 5300 12:18 PM)  Orders Added: 1)  Vit B12 1000 mcg [J3420] 2)  Admin of Therapeutic Inj  intramuscular or subcutaneous [04045]

## 2010-12-25 NOTE — Miscellaneous (Signed)
  Clinical Lists Changes  Observations: Added new observation of MAMMO DUE: 04/01/2011 (03/31/2010 15:38) Added new observation of MAMMOGRAM: Normal (03/31/2010 15:38)

## 2010-12-25 NOTE — Progress Notes (Signed)
Summary: ? Gallbladder  Phone Note Call from Patient   Caller: Patient Call For: Allena Earing MD Summary of Call: FYI---Patient called and requested that her last office notes be faxed to Dr. Earlean Shawl which is her GI specialist. Patient stated that we did not refer her to him and that she scheduled her own appt. Patient feels that her gallbladder is acting up because she is having pain under her right rib area into her back. Patient stated that she has seen Dr. Glori Bickers for this problem in the past. Advised patient that she will need to sign a medical release form in order for [place] to send this to Dr. Earlean Shawl or that she can come to the office and sign a release and we can give the office notes to her for her to take with her to the appt. on Friday. Patient stated that she will just sign a release at Dr. Liliane Channel office and have them fax the release over to get what they need. Dr. Liliane Channel fax # is [phone]. Initial call taken by: Emelia Salisbury LPN,  July 30, 5630 4:07 PM  Follow-up for Phone Call        all of that is just fine with me Follow-up by: Allena Earing MD,  July 30, 2010 4:25 PM

## 2010-12-25 NOTE — Miscellaneous (Signed)
Summary: BONE DENSITY  Clinical Lists Changes  Orders: Added new Test order of T-Bone Densitometry [phone]) - Signed Added new Test order of T-Lumbar Vertebral Assessment [phone]) - Signed

## 2010-12-25 NOTE — Miscellaneous (Signed)
Summary: mammo results  Clinical Lists Changes  Observations: Added new observation of MAMMO DUE: 04/2009 (05/07/2008 9:09) Added new observation of MAMMOGRAM: normal (05/07/2008 9:09)       Preventive Care Screening  Mammogram:    Date:  05/07/2008    Next Due:  04/2009    Results:  normal

## 2011-01-28 ENCOUNTER — Encounter: Payer: Self-pay | Admitting: Family Medicine

## 2011-01-28 ENCOUNTER — Ambulatory Visit (INDEPENDENT_AMBULATORY_CARE_PROVIDER_SITE_OTHER): Payer: Medicare Other

## 2011-01-28 DIAGNOSIS — E538 Deficiency of other specified B group vitamins: Secondary | ICD-10-CM

## 2011-02-03 NOTE — Assessment & Plan Note (Signed)
Summary: B12 SHOT / [place]  Nurse Visit   Allergies: 1)  ! * Mucinex Dm 2)  Ceftin 3)  Epinephrine 4)  Cipro 5)  Norvasc 6)  Septra 7)  * Altace 8)  * Antihistamines 9)  * Toprol 10)  Lipitor  Medication Administration  Injection # 1:    Medication: Vit B12 1000 mcg    Diagnosis: B12 DEFICIENCY (ICD-266.2)    Route: IM    Site: L deltoid    Exp Date: 10/22/2012    Lot #: [id number]    Mfr: [place]    Patient tolerated injection without complications    Given by: Christena Deem CMA ([place]) (January 28, 2011 12:00 PM)  Orders Added: 1)  Vit B12 1000 mcg [J3420] 2)  Admin of Therapeutic Inj  intramuscular or subcutaneous [96372]   Medication Administration  Injection # 1:    Medication: Vit B12 1000 mcg    Diagnosis: B12 DEFICIENCY (ICD-266.2)    Route: IM    Site: L deltoid    Exp Date: 10/22/2012    Lot #: [id number]    Mfr: [employer] Regent    Patient tolerated injection without complications    Given by: Christena Deem CMA ([place]) (January 28, 2011 12:00 PM)  Orders Added: 1)  Vit B12 1000 mcg [J3420] 2)  Admin of Therapeutic Inj  intramuscular or subcutaneous [25486]

## 2011-02-09 ENCOUNTER — Encounter: Payer: Self-pay | Admitting: Family Medicine

## 2011-02-10 ENCOUNTER — Ambulatory Visit (INDEPENDENT_AMBULATORY_CARE_PROVIDER_SITE_OTHER): Payer: Medicare Other | Admitting: Family Medicine

## 2011-02-10 ENCOUNTER — Encounter: Payer: Self-pay | Admitting: Family Medicine

## 2011-02-10 VITALS — BP 146/68 | HR 68 | Temp 98.3°F | Ht 65.0 in | Wt 218.1 lb

## 2011-02-10 DIAGNOSIS — R42 Dizziness and giddiness: Secondary | ICD-10-CM

## 2011-02-10 NOTE — Progress Notes (Signed)
[place] the beach last [place].  Started to have mild ST-scratchy, aches.  Felt like she had swollen glands in neck but this resolved.  Still with some aches.  No fevers.  No cough, no vomiting.  The room occ spins, worse with head movement.  This is some better with mucinex.  No presyncope/syncope.   ROS: See HPI.  Otherwise negative.  Meds, vitals, and allergies reviewed.   GEN: nad, alert and oriented HEENT: mucous membranes moist, tm wnl x2, nasal exam wnl, minimal OP cobblestoning NECK: supple w/o LA CV: rrr  PULM: ctab, no inc wob ABD: soft, +bs EXT: no edema SKIN: no acute rash CN 2-12 wnl B, S/S/DTR wnl x4 DHP weakly pos

## 2011-02-10 NOTE — Assessment & Plan Note (Signed)
Likely benign viral process that should improve on its own.  Okay to continue mucinex in meantime and follow up prn.  Vestibular anatomy d/w pt at she understood.  Okay for outpatient fu.

## 2011-02-10 NOTE — Patient Instructions (Signed)
Keep taking the mucinex, get some rest, and drink plenty of fluids.  You can stop the crestor for a few days then restart it.  Take care.  Call with questions.

## 2011-03-10 ENCOUNTER — Ambulatory Visit: Payer: Medicare Other

## 2011-03-11 ENCOUNTER — Ambulatory Visit (INDEPENDENT_AMBULATORY_CARE_PROVIDER_SITE_OTHER): Payer: Medicare Other | Admitting: Family Medicine

## 2011-03-11 ENCOUNTER — Encounter: Payer: Self-pay | Admitting: Family Medicine

## 2011-03-11 VITALS — BP 136/72 | HR 68 | Temp 98.2°F | Ht 65.0 in | Wt 216.8 lb

## 2011-03-11 DIAGNOSIS — T50905A Adverse effect of unspecified drugs, medicaments and biological substances, initial encounter: Secondary | ICD-10-CM

## 2011-03-11 DIAGNOSIS — E785 Hyperlipidemia, unspecified: Secondary | ICD-10-CM

## 2011-03-11 DIAGNOSIS — T887XXA Unspecified adverse effect of drug or medicament, initial encounter: Secondary | ICD-10-CM

## 2011-03-11 DIAGNOSIS — E538 Deficiency of other specified B group vitamins: Secondary | ICD-10-CM

## 2011-03-11 MED ORDER — CYANOCOBALAMIN 1000 MCG/ML IJ SOLN
1000.0000 ug | Freq: Once | INTRAMUSCULAR | Status: AC
  Administered 2011-03-11: 1000 ug via INTRAMUSCULAR

## 2011-03-11 NOTE — Progress Notes (Signed)
Subjective:    Patient ID: Tammy Manning, female    DOB: Aug 01, 1935, 76 y.o.   MRN: [id number]  HPI Here for f/u of cholesterol and myalgias   In past could not tolerate lipitor  Then tried on crestor and niacin  She thinks this made her ache all over and feel weak generally She felt awful on it ! -- is much better now   Diet - does not eat fried foods at all  Eats lean ground beef -  3-4 times per month  No cheese (does not tolerate dairy) , no milk , no ice cream  No butter as a rule  Eats fit and lean cookies - low in fat  Uses egg beaters  Loves shellfish -- eats it when she goes out quite often  Likes fish in general  No biscuits  Sausage or bacon once per month   zetia -- did not help at all   Was on niacin   Walks every day - for 25 minutes per day  An active person  Works in the yard regularly Secretary/administrator portions   Past Medical History  Diagnosis Date  . Cancer     Skin, basal cell on nose and eyelid  . Hyperlipidemia   . Arthritis     Knees  . DJD (degenerative joint disease)     Low back  . Dyspepsia   . B12 deficiency     Mild  . Lactose intolerance   . Mixed incontinence   . Vulvar irritation     from urine pad, uses Triamcinolone oint.   Past Surgical History  Procedure Date  . Abdominal hysterectomy 1982    Large fibroids and bleeding  . Bilateral salpingoophorectomy 1986  . Bone graft hip iliac crest     To Left arm  . Appendectomy   . Breast reduction surgery   . Hemorrhoid surgery 2006  . Esophagogastroduodenoscopy     Polyps    reports that she quit smoking about 47 years ago. She does not have any smokeless tobacco history on file. She reports that she drinks alcohol. Her drug history not on file. family history includes Cancer in her mother and paternal aunt; Diabetes in her brother and sisters; and Heart disease in her father. Allergies  Allergen Reactions  . Amlodipine Besylate     REACTION: ? reaction    . Atorvastatin     REACTION: myalgia  . Cefuroxime Axetil     REACTION: reaction not known  . Ciprofloxacin     REACTION: ? reaction  . Crestor (Rosuvastatin Calcium)     Severe myalgias   . Dextromethorphan-Guaifenesin     REACTION: jittery  . Epinephrine     REACTION: reaction not known  . Ramipril     REACTION: ? reaction  . Sulfamethoxazole W/Trimethoprim     REACTION: ? reaction           Review of Systems  Constitutional: Negative for fever, activity change and appetite change.  Eyes: Negative for pain and visual disturbance.  Respiratory: Negative for chest tightness and wheezing.   Cardiovascular: Negative.   Gastrointestinal: Negative for nausea.  Genitourinary: Negative for urgency and frequency.  Musculoskeletal: Positive for arthralgias. Negative for joint swelling and gait problem.  Skin: Negative for pallor and rash.  Neurological: Negative for weakness, light-headedness, numbness and headaches.  Hematological: Negative for adenopathy. Does not bruise/bleed easily.  Psychiatric/Behavioral: Negative for dysphoric mood. The patient is not nervous/anxious.  Objective:   Physical Exam  Constitutional: She appears well-developed and well-nourished.       overwt and well appearing   HENT:  Head: Normocephalic and atraumatic.  Eyes: Conjunctivae and EOM are normal. Pupils are equal, round, and reactive to light.  Neck: Normal range of motion. Neck supple. No JVD present. Carotid bruit is not present.  Cardiovascular: Normal rate, regular rhythm and normal heart sounds.   Pulmonary/Chest: Effort normal and breath sounds normal.  Abdominal: Soft. Bowel sounds are normal.  Musculoskeletal: She exhibits no edema and no tenderness.       No acute joint changes   Lymphadenopathy:    She has no cervical adenopathy.  Neurological: She is alert. She has normal reflexes. Coordination normal.  Skin: Skin is warm and dry. No rash noted. No erythema. No  pallor.  Psychiatric: She has a normal mood and affect.          Assessment & Plan:

## 2011-03-11 NOTE — Assessment & Plan Note (Signed)
Severe myalgias from crestor and lipitor Does not want to try another statin  Strong family hx of high chol Nevertheless will try to optimize diet by quitting all red meat and shellfish - and re check 2 mo  zetia not effective Last opt is welchol or lipid clinic Will continue niacin Also disc fish and omega 3  Exercise rec 5 d per week

## 2011-03-11 NOTE — Patient Instructions (Signed)
Avoid red meat/ fried foods/ egg yolks/ fatty breakfast meats/ butter, cheese and high fat dairy/ and shellfish   Stick with exercise at least 5 days per week for 30 minutes per day - if not more  Continue niacin  Schedule fasting labs in 2 months for lipid profile If not good - may consider wellchol

## 2011-03-12 DIAGNOSIS — T50905A Adverse effect of unspecified drugs, medicaments and biological substances, initial encounter: Secondary | ICD-10-CM | POA: Insufficient documentation

## 2011-03-12 NOTE — Assessment & Plan Note (Signed)
Myalgias and ? Arthralgias from 2 statins One severe Will avoid these meds at this time Thankfully symptoms are resolved

## 2011-03-12 NOTE — Assessment & Plan Note (Signed)
Shot today

## 2011-04-07 ENCOUNTER — Other Ambulatory Visit: Payer: Self-pay | Admitting: Family Medicine

## 2011-04-07 LAB — HM MAMMOGRAPHY: HM Mammogram: NEGATIVE

## 2011-04-08 NOTE — Telephone Encounter (Signed)
Medication phoned to  [facility] as instructed.

## 2011-04-08 NOTE — Telephone Encounter (Signed)
Px written for call in   

## 2011-04-10 NOTE — Op Note (Signed)
[name], Tammy Manning NO.:  [id number]   MEDICAL RECORD NO.:  [id number]          PATIENT TYPE:  AMB   LOCATION:  [facility]                         FACILITY[facility]  [facility]   PHYSICIAN:  Darrelyn Hillock, MDDATE OF BIRTH:  02-Oct-1935   DATE OF PROCEDURE:  01/26/2005  DATE OF DISCHARGE:                                 OPERATIVE REPORT   PREOPERATIVE DIAGNOSIS:  Internal hemorrhoids with prolapse, grade III.   POSTOPERATIVE DIAGNOSIS:  Internal hemorrhoids with prolapse, grade III.   PROCEDURE:  Procedure for prolapsing hemorrhoids, rectopexy.   SURGEON:  Darrelyn Hillock, MD   ANESTHESIA:  General.   DESCRIPTION OF PROCEDURE:  Patient was taken to the operating room and  placed in a supine position.  After adequate general anesthesia was induced  using the endotracheal tube, the patient was placed in the prone jack-knife  position.  Perianal and rectal prep were undertaken.  Then 10 cc of 0.5%  Marcaine with Wydase was injected into the three hemorrhoidal bundles.  An  additional 25 cc of Marcaine were injected into the internal and external  sphincter muscles.  Anal dilatation was accomplished at three  fingerbreadths.  A 2-0 Prolene purse-string suture was placed into the  mucosa and submucosa circumferentially 5 cm proximal to the dentate line.  A  PPH stapler was then inserted into the rectum and closed after a purse-  string suture was tied.  There was no involvement of the vaginal wall.  It  was fired, and a 2.5 cm ring of hemorrhoidal tissue was inspected.  The  staple line was inspected.  There were a few areas of bleeding, which I  coagulated and placed Gelfoam packing.  The patient tolerated the procedure  well and went to the PACU in good condition.      KRH/MEDQ  D:  01/26/2005  T:  01/26/2005  Job:  [id number]

## 2011-05-06 ENCOUNTER — Ambulatory Visit (INDEPENDENT_AMBULATORY_CARE_PROVIDER_SITE_OTHER): Payer: Medicare Other | Admitting: Family Medicine

## 2011-05-06 DIAGNOSIS — E538 Deficiency of other specified B group vitamins: Secondary | ICD-10-CM

## 2011-05-06 MED ORDER — CYANOCOBALAMIN 1000 MCG/ML IJ SOLN
1000.0000 ug | Freq: Once | INTRAMUSCULAR | Status: AC
  Administered 2011-05-06: 1000 ug via INTRAMUSCULAR

## 2011-05-07 NOTE — Progress Notes (Signed)
  Subjective:    Patient ID: Tammy Manning, female    DOB: Apr 10, 1935, 76 y.o.   MRN: [id number]  HPI  Here for inj only  Review of Systems     Objective:   Physical Exam        Assessment & Plan:

## 2011-05-11 ENCOUNTER — Other Ambulatory Visit (INDEPENDENT_AMBULATORY_CARE_PROVIDER_SITE_OTHER): Payer: Medicare Other | Admitting: Family Medicine

## 2011-05-11 DIAGNOSIS — E785 Hyperlipidemia, unspecified: Secondary | ICD-10-CM

## 2011-05-11 LAB — LIPID PANEL
Cholesterol: 277 mg/dL — ABNORMAL HIGH (ref 0–200)
HDL: 59.7 mg/dL (ref >39.00)
Total CHOL/HDL Ratio: 5
Triglycerides: 133 mg/dL (ref 0.0–149.0)
VLDL: 26.6 mg/dL (ref 0.0–40.0)

## 2011-05-11 LAB — LDL CHOLESTEROL, DIRECT: Direct LDL: 186.7 mg/dL

## 2011-05-19 NOTE — Patient Instructions (Signed)
Here is ref to lipid clinic , will route to [facility]

## 2011-05-19 NOTE — Progress Notes (Signed)
Appointment scheduled for Thursday,  June 28th at [facility].Paper chart  Was closed and not able to attach to physicians note...cdavis 05-19-2011

## 2011-05-19 NOTE — Progress Notes (Signed)
Addended by: Loura Pardon A on: 05/19/2011 10:01 AM   Modules accepted: Orders

## 2011-05-21 ENCOUNTER — Ambulatory Visit (INDEPENDENT_AMBULATORY_CARE_PROVIDER_SITE_OTHER): Payer: Medicare Other

## 2011-05-21 VITALS — Wt 213.0 lb

## 2011-05-21 DIAGNOSIS — E785 Hyperlipidemia, unspecified: Secondary | ICD-10-CM

## 2011-05-21 NOTE — Progress Notes (Signed)
Tammy Manning is a 76 year old female presenting to lipid clinic today for initial evaluation of hyperlipidemia.  She was referred by her PCP, Dr. Glori Bickers after multiple statin intolerances.  In the past she has used Lipitor and Crestor, both of which caused significant myalgias.  LDL was better controlled on Crestor vs. Lipitor.  Patient has also tried Zetia which "did nothing".  Currently she is taking Niacin 500 mg daily in the evening and is tolerating this well.  Past medical history is negative for cardiovascular disease and positive for HTN.  Her family history includes her father who passed away of MI at the age of 41 years.    Review of diet reveals that the patient maintains a very heart healthy diet and chooses lean protein, no dairy or butter, uses only egg beaters, eats sausage or bacon only once per month, and does not eat biscuits.  Pt eats a lot of fish and does not fry her foods.    Review of exercise reveals that the patient is exercising regularly and walks 25-30 minutes 1-2 times per day.  She is also active in the yard and enjoys stretching exercises.  She also plays the Wi (bowling) for 30-60 minutes each night.    Patient does not smoke and quit 47 years ago.  She does not drink alcohol.

## 2011-05-21 NOTE — Assessment & Plan Note (Addendum)
Current lipid results:  TC 277 (previously 191, goal <200), TG 133 (previously 137, goal<150), HDL 59.7 (previously 69, goal>40), LDL 186 (previously 95, goal<130).  Patient is at goal for TG and HDL.  She is above goal for LDL due to d/c of Crestor.  Despite her history of myalgias patient is willing to retry a statin medication at a lower dose.  We will attempt Crestor at a lower dose and alternate day dosing.  I have also recommended that patient try CoQ10 if she would like at 100 mg daily.  Patient's diet and exercise are optimal and there is very little room for improvement in this area.  We will follow-up in 6 weeks to assess start of Crestor.   Plan: 1)  Continue to maintain healthy diet and exercise regimen 2)  Start Crestor 5 mg - 1/2 tablet (2.5 mg) on Monday, Wednesday, and Friday (I have supplied samples #21) 3)  Continue Niacin at current dose 4)  Return for follow-up in 6 weeks

## 2011-05-21 NOTE — Patient Instructions (Signed)
1)   Continue to maintain a healthy diet 2)  Continue exercising regularly 3)  Continue Niacin at current dose 4)  Start NEW medication:  Crestor 5 mg - Take 1/2 tablet (2.5 mg) on Monday, Wednesday, Friday 5)  Return to clinic in 6 weeks on Thursday August 9th @ 10:30 6)  Labwork on Monday August 6th @ 9:10 AM (FASTING)  CoEnzyme Q10 (CoQ10) 100 mg daily

## 2011-06-17 ENCOUNTER — Ambulatory Visit: Payer: Medicare Other

## 2011-06-25 ENCOUNTER — Ambulatory Visit (INDEPENDENT_AMBULATORY_CARE_PROVIDER_SITE_OTHER): Payer: Medicare Other | Admitting: Family Medicine

## 2011-06-25 DIAGNOSIS — E538 Deficiency of other specified B group vitamins: Secondary | ICD-10-CM

## 2011-06-25 MED ORDER — CYANOCOBALAMIN 1000 MCG/ML IJ SOLN
1000.0000 ug | Freq: Once | INTRAMUSCULAR | Status: AC
  Administered 2011-06-25: 1000 ug via INTRAMUSCULAR

## 2011-06-26 NOTE — Progress Notes (Signed)
B-12 shot

## 2011-06-29 ENCOUNTER — Other Ambulatory Visit (INDEPENDENT_AMBULATORY_CARE_PROVIDER_SITE_OTHER): Payer: Medicare Other | Admitting: Family Medicine

## 2011-06-29 DIAGNOSIS — E785 Hyperlipidemia, unspecified: Secondary | ICD-10-CM

## 2011-06-29 LAB — HEPATIC FUNCTION PANEL
ALT: 14 U/L (ref 0–35)
AST: 14 U/L (ref 0–37)
Albumin: 4 g/dL (ref 3.5–5.2)
Alkaline Phosphatase: 59 U/L (ref 39–117)
Bilirubin, Direct: 0 mg/dL (ref 0.0–0.3)
Total Bilirubin: 0.8 mg/dL (ref 0.3–1.2)
Total Protein: 6.8 g/dL (ref 6.0–8.3)

## 2011-06-29 LAB — LDL CHOLESTEROL, DIRECT: Direct LDL: 129.3 mg/dL

## 2011-06-29 LAB — LIPID PANEL
Cholesterol: 216 mg/dL — ABNORMAL HIGH (ref 0–200)
HDL: 65.4 mg/dL (ref >39.00)
Total CHOL/HDL Ratio: 3
Triglycerides: 112 mg/dL (ref 0.0–149.0)
VLDL: 22.4 mg/dL (ref 0.0–40.0)

## 2011-07-02 ENCOUNTER — Ambulatory Visit (INDEPENDENT_AMBULATORY_CARE_PROVIDER_SITE_OTHER): Payer: Medicare Other

## 2011-07-02 VITALS — BP 130/70 | Wt 212.4 lb

## 2011-07-02 DIAGNOSIS — E785 Hyperlipidemia, unspecified: Secondary | ICD-10-CM

## 2011-07-02 NOTE — Patient Instructions (Signed)
1) Continue Crestor 2.15m Mondays, Wednesdays, Fridays 2) Continue Niacin 5077mevery evening 3) Try to increase exercise  Lipid clinic follow-up: 10/01/11 at 10:30am

## 2011-07-02 NOTE — Assessment & Plan Note (Signed)
Assessment: Current labs: TChol 216 (previously 277; goal <200), LDL 129.3 (previously 186.7; goal <130), HDL 65.4 (previously 59.7; goal >45), Trig 112 (previoulsy 133; goal <150). Patient lipids greatly improved since reinitiating Crestor 2.9m MWF (without any complaints of muscle pain). Patient has not been exercising regularly due to the heat, but plans to restart regular walking when the summer heat cools down. Patient appears to be well controlled on current medication regimen so will continue and follow-up again in 3 months.   Plan:  1) Continue Crestor 2.569mby mouth Monday, Wednesday, Friday.  2) Continue Niacin 50082my mouth daily. 3) Continue healthy diet choices 4) Try to increase exercise as able (as weather permits). 5) Return for follow-up lipid clinic: 10/01/11 at 10:30am (obtain fasting labs a few days prior)

## 2011-07-02 NOTE — Progress Notes (Signed)
Tammy Manning is a 89yoF presented for her follow-up appointment at the Lipid clinic today and was very happy to see her improved lipid lab values. (TChol 216, LDL 129.3, HDL 65.4, Tri 112). In the past she used Lipitor and Crestor, both of which caused significant myalgias. At her initial appointment in June, we started her on Crestor 2.5m MWF and continued her on Niacin 5019mevery evening. She reports always remembering to take her Crestor, but that on average she remembers to take her Niacin about 5 days/week. She denies any muscle pain with her current Crestor regimen or any flushing with her niacin.  Review of diet reveals that the patient maintains a very heart healthy diet. Breakfast generally includes egg beaters, toast, or grits. For lunch, she eats chicken or ham on whole wheat bread with tomatoes and mayo. Dinner varies, but she enjoys ahi tuna, spaghetti with meatballs, sauteed chicken with vegetables and a salad almost every night.  For snacks, Tammy Manning fit&active cookies, raw carrots, nuts, or corn curls.  Review of exercise reveals that the patient has not been regularly due to the heat. She reports that she usually goes for walks with her husband but that it has been too hot lately to go walking. She denies any other exercise. She plans to resume walking more when the weather cools down.  Current Outpatient Prescriptions on File Prior to Visit  Medication Sig Dispense Refill  . aspirin 81 MG tablet Take 81 mg by mouth daily.        . Calcium Carbonate-Vit D-Min (CALCIUM 1200) 1200-1000 MG-UNIT CHEW Chew 1 tablet by mouth daily.        . Calcium Carbonate-Vitamin D (CALCIUM 600 + D PO) Take 2 by mouth daily.       . cholecalciferol (VITAMIN D) 1000 UNITS tablet Take 1,000 Units by mouth daily.        . clorazepate (TRANXENE) 7.5 MG tablet TAKE 1 TABLET BY MOUTH DAILY AS NEEDED  30 tablet  0  . cyanocobalamin (,VITAMIN B-12,) 1000 MCG/ML injection Inject 1,000 mcg into the  muscle. every six weeks       . glucosamine-chondroitin 500-400 MG tablet Take 2 tablets by mouth daily.       . irbesartan (AVAPRO) 150 MG tablet Take 150 mg by mouth at bedtime.        . Magnesium 250 MG TABS Take 1 tablet by mouth daily.        . niacin 250 MG tablet Take 250 mg by mouth daily with breakfast.        . omeprazole (PRILOSEC) 20 MG capsule Take 20 mg by mouth daily.        . rosuvastatin (CRESTOR) 5 MG tablet Take 2.5 mg by mouth. Take 1/2 tablet (2.5 mg) on Monday, Wednesday, and Friday.       . triamcinolone (KENALOG) 0.1 % ointment Apply to affected areas once daily as needed (not more than twice per week)        Allergies  Allergen Reactions  . Amlodipine Besylate     REACTION: ? reaction  . Atorvastatin     REACTION: myalgia  . Cefuroxime Axetil     REACTION: reaction not known  . Ciprofloxacin     REACTION: ? reaction  . Crestor (Rosuvastatin Calcium)     Severe myalgias   . Epinephrine     REACTION: reaction not known  . Guiatuss Dm     REACTION: jittery  . Ramipril  REACTION: ? reaction  . Sulfamethoxazole W/Trimethoprim     REACTION: ? reaction

## 2011-07-28 ENCOUNTER — Telehealth: Payer: Self-pay | Admitting: Pharmacist

## 2011-07-28 MED ORDER — ROSUVASTATIN CALCIUM 5 MG PO TABS: ORAL_TABLET | ORAL | Status: DC

## 2011-07-28 NOTE — Telephone Encounter (Signed)
Pt called to report she is having more episodes of restless legs since increasing Niacin from 261m to 5011m  Would like to decrease back down to 25034mnd take in the morning.  Informed pt that this would be okay.

## 2011-08-06 ENCOUNTER — Ambulatory Visit (INDEPENDENT_AMBULATORY_CARE_PROVIDER_SITE_OTHER): Payer: Medicare Other | Admitting: *Deleted

## 2011-08-06 DIAGNOSIS — E538 Deficiency of other specified B group vitamins: Secondary | ICD-10-CM

## 2011-08-06 MED ORDER — CYANOCOBALAMIN 1000 MCG/ML IJ SOLN
1000.0000 ug | Freq: Once | INTRAMUSCULAR | Status: AC
  Administered 2011-08-06: 1000 ug via INTRAMUSCULAR

## 2011-08-13 ENCOUNTER — Other Ambulatory Visit: Payer: Self-pay | Admitting: *Deleted

## 2011-08-13 MED ORDER — IRBESARTAN 150 MG PO TABS: 150.0000 mg | ORAL_TABLET | Freq: Every day | ORAL | Status: DC

## 2011-08-27 ENCOUNTER — Encounter: Payer: Self-pay | Admitting: Family Medicine

## 2011-09-08 ENCOUNTER — Encounter: Payer: Self-pay | Admitting: Family Medicine

## 2011-09-08 ENCOUNTER — Ambulatory Visit (INDEPENDENT_AMBULATORY_CARE_PROVIDER_SITE_OTHER): Payer: Medicare Other | Admitting: Family Medicine

## 2011-09-08 DIAGNOSIS — J069 Acute upper respiratory infection, unspecified: Secondary | ICD-10-CM | POA: Insufficient documentation

## 2011-09-08 NOTE — Patient Instructions (Signed)
Take Guaifenesin (452m), take 11/2 tabs by mouth AM and NOON. Get GUAIFENESIN by  going [facility] [place] and getting MUCOUS RELIEF EXPECTORANT/CONGESTION. DO NOT GET MUCINEX (Timed Release Guaifenesin)  Drink lots of fluids. Take Tyl 5040mtwo tabs three times a day for a few days. Vicks at night. Halls lozenges if needed.

## 2011-09-08 NOTE — Assessment & Plan Note (Signed)
See instructions.

## 2011-09-08 NOTE — Progress Notes (Signed)
  Subjective:    Patient ID: Tammy Manning, female    DOB: [birth year], 76 y.o.   MRN: [id number]  HPI Pt of Dr Marliss Coots here with her husband also being seen for similar symptoms for acute appt for sinus congestion and cough. She denies fever but has had headache in the frontal area. No ear pain, minimal rhinitis in the morning that is clear, some ST mainly at night and he has cough that is nonproductive. She has no N/V, no diarrhea. This began mildly on a cruise from sitting under an air conditioner in the dining room. She has taken an allergy pill (Claritin), he also tried Mucinex.  She is most worried about her husband because he is a diabetic. She may not have come in if he weren't being seen.      Review of SystemsNoncontributory except as above.       Objective:   Physical Exam  Constitutional: She is oriented to person, place, and time. She appears well-developed and well-nourished. No distress.       Nontoxic appearing.  HENT:  Head: Normocephalic and atraumatic.  Right Ear: External ear normal.  Left Ear: External ear normal.  Nose: Nose normal.  Mouth/Throat: Oropharynx is clear and moist. No oropharyngeal exudate.  Eyes: Conjunctivae and EOM are normal. Pupils are equal, round, and reactive to light.  Neck: Normal range of motion. Neck supple. No thyromegaly present.  Cardiovascular: Normal rate, regular rhythm and normal heart sounds.   Pulmonary/Chest: Effort normal and breath sounds normal. She has no wheezes. She has no rales.  Lymphadenopathy:    She has no cervical adenopathy.  Neurological: She is alert and oriented to person, place, and time.  Skin: Skin is warm and dry. She is not diaphoretic.  Psychiatric: She has a normal mood and affect. Her behavior is normal. Judgment and thought content normal.          Assessment & Plan:

## 2011-09-27 ENCOUNTER — Telehealth: Payer: Self-pay | Admitting: Family Medicine

## 2011-09-27 DIAGNOSIS — E538 Deficiency of other specified B group vitamins: Secondary | ICD-10-CM

## 2011-09-27 DIAGNOSIS — R5381 Other malaise: Secondary | ICD-10-CM

## 2011-09-27 DIAGNOSIS — R5383 Other fatigue: Secondary | ICD-10-CM

## 2011-09-27 DIAGNOSIS — E785 Hyperlipidemia, unspecified: Secondary | ICD-10-CM

## 2011-09-27 DIAGNOSIS — I10 Essential (primary) hypertension: Secondary | ICD-10-CM

## 2011-09-27 NOTE — Telephone Encounter (Signed)
Message copied by Abner Greenspan on Sun Sep 27, 2011  8:24 PM ------      Message from: Ellamae Sia      Created: Fri Sep 25, 2011  4:21 PM      Regarding: Labs for Tuesday, 11-6       Patient is scheduled for CPX labs, please order future labs, Thanks , Karna Christmas

## 2011-09-29 ENCOUNTER — Other Ambulatory Visit (INDEPENDENT_AMBULATORY_CARE_PROVIDER_SITE_OTHER): Payer: Medicare Other

## 2011-09-29 ENCOUNTER — Ambulatory Visit (INDEPENDENT_AMBULATORY_CARE_PROVIDER_SITE_OTHER): Payer: Medicare Other | Admitting: *Deleted

## 2011-09-29 DIAGNOSIS — R5383 Other fatigue: Secondary | ICD-10-CM

## 2011-09-29 DIAGNOSIS — R5381 Other malaise: Secondary | ICD-10-CM

## 2011-09-29 DIAGNOSIS — E538 Deficiency of other specified B group vitamins: Secondary | ICD-10-CM

## 2011-09-29 DIAGNOSIS — E785 Hyperlipidemia, unspecified: Secondary | ICD-10-CM

## 2011-09-29 DIAGNOSIS — I10 Essential (primary) hypertension: Secondary | ICD-10-CM

## 2011-09-29 LAB — COMPREHENSIVE METABOLIC PANEL
ALT: 14 U/L (ref 0–35)
AST: 16 U/L (ref 0–37)
Albumin: 3.9 g/dL (ref 3.5–5.2)
Alkaline Phosphatase: 58 U/L (ref 39–117)
BUN: 21 mg/dL (ref 6–23)
CO2: 29 mEq/L (ref 19–32)
Calcium: 9.3 mg/dL (ref 8.4–10.5)
Chloride: 106 mEq/L (ref 96–112)
Creatinine, Ser: 0.9 mg/dL (ref 0.4–1.2)
GFR: 63.33 mL/min (ref >60.00)
Glucose, Bld: 85 mg/dL (ref 70–99)
Potassium: 4.3 mEq/L (ref 3.5–5.1)
Sodium: 141 mEq/L (ref 135–145)
Total Bilirubin: 0.6 mg/dL (ref 0.3–1.2)
Total Protein: 6.9 g/dL (ref 6.0–8.3)

## 2011-09-29 LAB — CBC WITH DIFFERENTIAL/PLATELET
Basophils Absolute: 0 10*3/uL (ref 0.0–0.1)
Basophils Relative: 0.7 % (ref 0.0–3.0)
Eosinophils Absolute: 0.2 10*3/uL (ref 0.0–0.7)
Eosinophils Relative: 3.3 % (ref 0.0–5.0)
HCT: 37.2 % (ref 36.0–46.0)
Hemoglobin: 12.5 g/dL (ref 12.0–15.0)
Lymphocytes Relative: 38.7 % (ref 12.0–46.0)
Lymphs Abs: 2 10*3/uL (ref 0.7–4.0)
MCHC: 33.6 g/dL (ref 30.0–36.0)
MCV: 89.2 fl (ref 78.0–100.0)
Monocytes Absolute: 0.4 10*3/uL (ref 0.1–1.0)
Monocytes Relative: 7.9 % (ref 3.0–12.0)
Neutro Abs: 2.6 10*3/uL (ref 1.4–7.7)
Neutrophils Relative %: 49.4 % (ref 43.0–77.0)
Platelets: 281 10*3/uL (ref 150.0–400.0)
RBC: 4.18 Mil/uL (ref 3.87–5.11)
RDW: 16.6 % — ABNORMAL HIGH (ref 11.5–14.6)
WBC: 5.2 10*3/uL (ref 4.5–10.5)

## 2011-09-29 LAB — LIPID PANEL
Cholesterol: 232 mg/dL — ABNORMAL HIGH (ref 0–200)
HDL: 67.8 mg/dL (ref >39.00)
Total CHOL/HDL Ratio: 3
Triglycerides: 102 mg/dL (ref 0.0–149.0)
VLDL: 20.4 mg/dL (ref 0.0–40.0)

## 2011-09-29 LAB — LDL CHOLESTEROL, DIRECT: Direct LDL: 152.7 mg/dL

## 2011-09-29 LAB — VITAMIN B12: Vitamin B-12: 393 pg/mL (ref 211–911)

## 2011-09-29 LAB — TSH: TSH: 1.09 u[IU]/mL (ref 0.35–5.50)

## 2011-09-29 MED ORDER — CYANOCOBALAMIN 1000 MCG/ML IJ SOLN
1000.0000 ug | Freq: Once | INTRAMUSCULAR | Status: AC
  Administered 2011-09-29: 1000 ug via INTRAMUSCULAR

## 2011-10-01 ENCOUNTER — Ambulatory Visit (INDEPENDENT_AMBULATORY_CARE_PROVIDER_SITE_OTHER): Payer: Medicare Other

## 2011-10-01 VITALS — Wt 211.5 lb

## 2011-10-01 DIAGNOSIS — E785 Hyperlipidemia, unspecified: Secondary | ICD-10-CM

## 2011-10-01 MED ORDER — ROSUVASTATIN CALCIUM 5 MG PO TABS: ORAL_TABLET | ORAL | Status: DC

## 2011-10-01 NOTE — Patient Instructions (Signed)
Increase Crestor to 81m on Monday, Wednesday and Friday.    Continue diet and exercise routines.   Recheck labs in 3 months.  I will call you with your lab appt.

## 2011-10-06 ENCOUNTER — Encounter: Payer: Self-pay | Admitting: Family Medicine

## 2011-10-06 ENCOUNTER — Ambulatory Visit (INDEPENDENT_AMBULATORY_CARE_PROVIDER_SITE_OTHER): Payer: Medicare Other | Admitting: Family Medicine

## 2011-10-06 VITALS — BP 144/78 | HR 72 | Temp 98.5°F | Ht 65.0 in | Wt 212.8 lb

## 2011-10-06 DIAGNOSIS — I10 Essential (primary) hypertension: Secondary | ICD-10-CM

## 2011-10-06 DIAGNOSIS — E785 Hyperlipidemia, unspecified: Secondary | ICD-10-CM

## 2011-10-06 DIAGNOSIS — N951 Menopausal and female climacteric states: Secondary | ICD-10-CM

## 2011-10-06 NOTE — Assessment & Plan Note (Signed)
Will schedule 2 year dexa Takes ca and D but is on PPI prn  No hx of fx Disc wt bearing exercise

## 2011-10-06 NOTE — Assessment & Plan Note (Signed)
Pt's cholesterol has increased since last visit.  TC- 232 (goal<200), TG- 102 (goal<150), HDL- 67 (goal>45), and LDL- 152 (goal<130).  LFTs are WNL.  Will have patient increase Crestor to 22m on Monday, Wednesday and Friday to help lower LDL closer to goal.  Encouraged her to try to get more aerobic exercise.  Will f/u in 3 months.

## 2011-10-06 NOTE — Assessment & Plan Note (Signed)
bp up a bit after eating salty food on a cruise Will work on that - continue exercise and work on wt loss Overall control is fair  No changes needed  Disc lifstyle change with low sodium diet and exercise

## 2011-10-06 NOTE — Progress Notes (Signed)
HPI  Tammy Manning is a 24yoF presented for her follow-up appointment at the Lipid clinic today.  Her current regimen includes Niacin 243m and Crestor 2.516mon Monday, Wednesday and Friday.  She called the clinic shortly after her last visit to report increase in restless leg type symptoms with the increase in Niacin from 25039mo 500m26mShe cut back down to 250mg26m has not had any problems since.  She is also taking CoQ10 200mg 103my to help prevent muscle issues.    Review of diet reveals that the patient maintains a very heart healthy diet.  She has recently been on a cruise and increased her fish intake, but other than that, no significant changes since the last visit.    Review of exercise reveals that the patient does some physical activity but fairly limited cardiac exercise.  She will bowl using her Wii at home.  She also likes to walk her dog with her husband for about 20 minutes per day.  She does admit that this is usually at a slower pace.    Current Outpatient Prescriptions on File Prior to Visit  Medication Sig Dispense Refill  . aspirin 81 MG tablet Take 81 mg by mouth daily.        . Calcium Carbonate-Vitamin D (CALCIUM 600 + D PO) Take 2 by mouth daily.       . cholecalciferol (VITAMIN D) 1000 UNITS tablet Take 1,000 Units by mouth daily.        . Coenzyme Q10 200 MG capsule Take 200 mg by mouth daily.        . cyanocobalamin (,VITAMIN B-12,) 1000 MCG/ML injection Inject 1,000 mcg into the muscle. every six weeks       . irbesartan (AVAPRO) 150 MG tablet Take 1 tablet (150 mg total) by mouth at bedtime.  90 tablet  0  . Magnesium 250 MG TABS Take 1 tablet by mouth daily.        . omepMarland Kitchenazole (PRILOSEC) 20 MG capsule Take 20 mg by mouth daily.         Allergies  Allergen Reactions  . Amlodipine Besylate     REACTION: ? reaction  . Atorvastatin     REACTION: myalgia  . Cefuroxime Axetil     REACTION: reaction not known  . Ciprofloxacin     REACTION: ? reaction  . Crestor  (Rosuvastatin Calcium)     Severe myalgias   . Epinephrine     REACTION: reaction not known  . Guiatuss Dm     REACTION: jittery  . Ramipril     REACTION: ? reaction  . Sulfamethoxazole W/Trimethoprim     REACTION: ? reaction

## 2011-10-06 NOTE — Assessment & Plan Note (Signed)
Per lipid clinic - tolerating crestor increase so far  Last LDL in 150s- pt thinks due to eating poorly on a cruise Disc goals for lipids and reasons to control them Rev labs with pt Rev low sat fat diet in detail

## 2011-10-06 NOTE — Progress Notes (Signed)
Subjective:    Patient ID: Tammy Manning, female    DOB: 06-11-1935, 76 y.o.   MRN: [id number]  HPI Here for annual check up of chronic med problems and to rev health mt list  Is feeling ok overall  Stays busy most of the time   Has a little problem with her L hip -- since her r leg is shorter than the L Will think about seeing Dr Lorelei Pont for that if it worsens  Is walking for exercise- very active   bp is 144/78 today - retained some fluid from her cruise  No change in HTN meds or side eff No cp or edema or palpitations  Wt is stable with high bmi of 35 Diet- was good until she went on a cruise - she does have to watch what she eats  Did eat some fish (every thing had a lot of salt)  Exercise-- is good   LDL was up with lipids Lab Results  Component Value Date   CHOL 232* 09/29/2011   HDL 67.80 09/29/2011   [place] 95 09/24/2010   LDLDIRECT 152.7 09/29/2011   TRIG 102.0 09/29/2011   CHOLHDL 3 09/29/2011   this is folowed by lipid clinic Is on limited crestor as tolerated -this was recently increased Doing fine with that so far   Brother was dx with leukemia   Zoster status - has never had the vaccine  Had her seasonal flu shot Td 04  colonosc 08- mother had colon cancer   Pneumovax 09- up to date  Past hx of tot hyst  No symptoms at all  No longer has any vulvar irritation from pads Does exercises for incontinence   Mam nl 5/12- good  Self exam- no lumps or changes   dexa nl in 09-- is interested in re screening  Only takes PPI when needed  No hx of fx and stays active  On ca and vit D  Patient Active Problem List  Diagnoses  . B12 DEFICIENCY  . HYPERLIPIDEMIA  . STRESS REACTION, ACUTE, WITH EMOTIONAL DISTURBANCE  . HYPERTENSION, ESSENTIAL NOS  . DYSPEPSIA  . CONSTIPATION  . IBS  . BREAST MASS, LEFT  . UNSPEC SYMPTOM ASSOC W/FEMALE GENITAL ORGANS  . POSTMENOPAUSAL STATUS  . DERMATITIS, ATOPIC  . LEG PAIN, RIGHT  . FATIGUE  . HEADACHE, CHRONIC  .  SKIN CANCER, HX OF  . Vertigo  . Adverse drug reaction   Past Medical History  Diagnosis Date  . Cancer     Skin, basal cell on nose and eyelid  . Hyperlipidemia   . Arthritis     Knees  . DJD (degenerative joint disease)     Low back  . Dyspepsia   . B12 deficiency     Mild  . Lactose intolerance   . Mixed incontinence   . Vulvar irritation     from urine pad, uses Triamcinolone oint.   Past Surgical History  Procedure Date  . Abdominal hysterectomy 1982    Large fibroids and bleeding  . Bilateral salpingoophorectomy 1986  . Bone graft hip iliac crest     To Left arm  . Appendectomy   . Breast reduction surgery   . Hemorrhoid surgery 2006  . Esophagogastroduodenoscopy     Polyps   History  Substance Use Topics  . Smoking status: Former Smoker    Quit date: 11/24/1963  . Smokeless tobacco: Never Used  . Alcohol Use: Yes     Rare   Family History  Problem Relation Age of Onset  . Cancer Mother     Colon  . Heart disease Father     CAD  . Diabetes Sister   . Diabetes Brother   . Leukemia Brother   . Cancer Paternal Aunt     Breast  . Diabetes Sister    Allergies  Allergen Reactions  . Amlodipine Besylate     REACTION: ? reaction  . Atorvastatin     REACTION: myalgia  . Cefuroxime Axetil     REACTION: reaction not known  . Ciprofloxacin     REACTION: ? reaction  . Crestor (Rosuvastatin Calcium)     Severe myalgias   . Epinephrine     REACTION: reaction not known  . Guiatuss Dm     REACTION: jittery  . Ramipril     REACTION: ? reaction  . Sulfamethoxazole W/Trimethoprim     REACTION: ? reaction   Current Outpatient Prescriptions on File Prior to Visit  Medication Sig Dispense Refill  . aspirin 81 MG tablet Take 81 mg by mouth daily.        . Calcium Carbonate-Vitamin D (CALCIUM 600 + D PO) Take 2 by mouth daily.      . cholecalciferol (VITAMIN D) 1000 UNITS tablet Take 1,000 Units by mouth daily.        . Coenzyme Q10 200 MG capsule Take  200 mg by mouth daily.        . cyanocobalamin (,VITAMIN B-12,) 1000 MCG/ML injection Inject 1,000 mcg into the muscle. every six weeks       . glucosamine-chondroitin 500-400 MG tablet Take 2 tablets by mouth daily.        . Magnesium 250 MG TABS Take 1 tablet by mouth daily.        [name] Kitchen omeprazole (PRILOSEC) 20 MG capsule Take 20 mg by mouth daily.        . rosuvastatin (CRESTOR) 5 MG tablet Take 1 tablet on Monday, Wednesday, and Friday.  10 tablet  6         Review of Systems Review of Systems  Constitutional: Negative for fever, appetite change, fatigue and unexpected weight change.  Eyes: Negative for pain and visual disturbance.  ENT pos for poor hearing (cannot afford hearing aids yet )  Respiratory: Negative for cough and shortness of breath.   Cardiovascular: Negative for cp or palpitations    Gastrointestinal: Negative for nausea, diarrhea and constipation.  Genitourinary: Negative for urgency and frequency.  Skin: Negative for pallor or rash   Neurological: Negative for weakness, light-headedness, numbness and headaches.  Hematological: Negative for adenopathy. Does not bruise/bleed easily.  Psychiatric/Behavioral: Negative for dysphoric mood. The patient is not nervous/anxious.          Objective:   Physical Exam  Constitutional: She appears well-developed and well-nourished. No distress.       overwt and well appearing   HENT:  Head: Normocephalic and atraumatic.  Right Ear: External ear normal.  Left Ear: External ear normal.  Nose: Nose normal.  Mouth/Throat: Oropharynx is clear and moist.  Eyes: Conjunctivae and EOM are normal. Pupils are equal, round, and reactive to light. No scleral icterus.  Neck: Normal range of motion. Neck supple. No JVD present. Carotid bruit is not present. No thyromegaly present.  Cardiovascular: Normal rate, regular rhythm, normal heart sounds and intact distal pulses.  Exam reveals no gallop.   Pulmonary/Chest: Effort normal and  breath sounds normal. No respiratory distress. She has no wheezes.  Abdominal:  Soft. Bowel sounds are normal. She exhibits no distension, no abdominal bruit and no mass. There is no tenderness.  Genitourinary: No breast swelling, tenderness, discharge or bleeding.  Musculoskeletal: Normal range of motion. She exhibits no edema and no tenderness.  Lymphadenopathy:    She has no cervical adenopathy.  Neurological: She is alert. She has normal reflexes. No cranial nerve deficit. She exhibits normal muscle tone. Coordination normal.  Skin: Skin is warm and dry. No rash noted. No erythema.  Psychiatric: She has a normal mood and affect.          Assessment & Plan:

## 2011-10-06 NOTE — Patient Instructions (Addendum)
Please send to Dr Allyn Kenner for last colonoscopy report - I think her 5 year callback is coming up  If you are interested in a shingles/zoster vaccine - call your insurance to check on coverage,( you should not get it within 1 month of other vaccines) , then call us for a prescription  for it to take to a pharmacy that gives the shot  Continue to work on healthy diet and exercise for better health  We will schedule bone density test at check out

## 2011-10-12 ENCOUNTER — Ambulatory Visit (INDEPENDENT_AMBULATORY_CARE_PROVIDER_SITE_OTHER)
Admission: RE | Admit: 2011-10-12 | Discharge: 2011-10-12 | Disposition: A | Discharge status: Home / Self Care | Payer: Medicare Other | Source: Ambulatory Visit

## 2011-10-12 DIAGNOSIS — N951 Menopausal and female climacteric states: Secondary | ICD-10-CM

## 2011-10-12 DIAGNOSIS — Z13820 Encounter for screening for osteoporosis: Secondary | ICD-10-CM

## 2011-10-22 ENCOUNTER — Ambulatory Visit: Payer: Medicare Other | Admitting: Family Medicine

## 2011-11-12 ENCOUNTER — Ambulatory Visit (INDEPENDENT_AMBULATORY_CARE_PROVIDER_SITE_OTHER): Payer: Medicare Other | Admitting: *Deleted

## 2011-11-12 DIAGNOSIS — E538 Deficiency of other specified B group vitamins: Secondary | ICD-10-CM

## 2011-11-12 MED ORDER — CYANOCOBALAMIN 1000 MCG/ML IJ SOLN
1000.0000 ug | Freq: Once | INTRAMUSCULAR | Status: AC
  Administered 2011-11-12: 1000 ug via INTRAMUSCULAR

## 2011-11-13 ENCOUNTER — Ambulatory Visit: Payer: Medicare Other

## 2011-12-16 ENCOUNTER — Other Ambulatory Visit: Payer: Self-pay | Admitting: *Deleted

## 2011-12-16 MED ORDER — IRBESARTAN 150 MG PO TABS: 150.0000 mg | ORAL_TABLET | Freq: Every day | ORAL | Status: DC

## 2011-12-16 NOTE — Telephone Encounter (Signed)
Rx sent to [facility], patient notified via message left on cell phone voicemail.

## 2011-12-21 ENCOUNTER — Other Ambulatory Visit: Payer: Self-pay | Admitting: Pharmacist

## 2011-12-21 MED ORDER — ROSUVASTATIN CALCIUM 5 MG PO TABS: ORAL_TABLET | ORAL | Status: DC

## 2011-12-22 DIAGNOSIS — H2510 Age-related nuclear cataract, unspecified eye: Secondary | ICD-10-CM | POA: Diagnosis not present

## 2011-12-28 ENCOUNTER — Other Ambulatory Visit: Payer: Medicare Other

## 2011-12-29 ENCOUNTER — Other Ambulatory Visit: Payer: Medicare Other

## 2011-12-29 DIAGNOSIS — M9981 Other biomechanical lesions of cervical region: Secondary | ICD-10-CM | POA: Diagnosis not present

## 2011-12-29 DIAGNOSIS — IMO0002 Reserved for concepts with insufficient information to code with codable children: Secondary | ICD-10-CM | POA: Diagnosis not present

## 2011-12-29 DIAGNOSIS — M5030 Other cervical disc degeneration, unspecified cervical region: Secondary | ICD-10-CM | POA: Diagnosis not present

## 2011-12-29 DIAGNOSIS — M999 Biomechanical lesion, unspecified: Secondary | ICD-10-CM | POA: Diagnosis not present

## 2011-12-31 ENCOUNTER — Other Ambulatory Visit (INDEPENDENT_AMBULATORY_CARE_PROVIDER_SITE_OTHER): Payer: Medicare Other

## 2011-12-31 ENCOUNTER — Ambulatory Visit: Payer: Medicare Other

## 2011-12-31 ENCOUNTER — Other Ambulatory Visit: Payer: Medicare Other

## 2011-12-31 ENCOUNTER — Ambulatory Visit (INDEPENDENT_AMBULATORY_CARE_PROVIDER_SITE_OTHER): Payer: Medicare Other

## 2011-12-31 DIAGNOSIS — E785 Hyperlipidemia, unspecified: Secondary | ICD-10-CM

## 2011-12-31 DIAGNOSIS — E538 Deficiency of other specified B group vitamins: Secondary | ICD-10-CM | POA: Diagnosis not present

## 2011-12-31 LAB — LDL CHOLESTEROL, DIRECT: Direct LDL: 119.3 mg/dL

## 2011-12-31 LAB — LIPID PANEL
Cholesterol: 207 mg/dL — ABNORMAL HIGH (ref 0–200)
HDL: 66.7 mg/dL (ref >39.00)
Total CHOL/HDL Ratio: 3
Triglycerides: 122 mg/dL (ref 0.0–149.0)
VLDL: 24.4 mg/dL (ref 0.0–40.0)

## 2011-12-31 LAB — HEPATIC FUNCTION PANEL
ALT: 11 U/L (ref 0–35)
AST: 14 U/L (ref 0–37)
Albumin: 3.7 g/dL (ref 3.5–5.2)
Alkaline Phosphatase: 50 U/L (ref 39–117)
Bilirubin, Direct: 0 mg/dL (ref 0.0–0.3)
Total Bilirubin: 0.5 mg/dL (ref 0.3–1.2)
Total Protein: 6.6 g/dL (ref 6.0–8.3)

## 2011-12-31 MED ORDER — CYANOCOBALAMIN 1000 MCG/ML IJ SOLN
1000.0000 ug | Freq: Once | INTRAMUSCULAR | Status: AC
  Administered 2011-12-31: 1000 ug via INTRAMUSCULAR

## 2012-01-07 ENCOUNTER — Ambulatory Visit (INDEPENDENT_AMBULATORY_CARE_PROVIDER_SITE_OTHER): Payer: Medicare Other | Admitting: Pharmacist

## 2012-01-07 VITALS — Wt 212.0 lb

## 2012-01-07 DIAGNOSIS — E785 Hyperlipidemia, unspecified: Secondary | ICD-10-CM

## 2012-01-07 NOTE — Assessment & Plan Note (Signed)
Assessment:  Patient is well controlled on Crestor 5 mg MWF and has met her LDL goal <130 and her TG goal <150. Goal: reinforce importance of maintaining diet and exercise regimen and medication adherence to prevent cardiovascular events.  Labs:  TC 207 (09/29/11 value 232), TG 122, HDL 66.7, LDL 119.3 (down from 157.2 on 09/29/11); LFTs WNL  Plan: 1.  Continue Crestor 5 mg PO MWF.   2.  Continue her current diet and exercise regimen. 3.  Follow up with PCP for future visits unless issues arise.

## 2012-01-07 NOTE — Progress Notes (Signed)
HPI:  Tammy Manning is a 77 yo female presenting today for a lipid visit.  Her PMH is significant for HTN and lipids.  She has previously been on Lipitor and Crestor with myalgias, but she is currently tolerating her Crestor 5 mg MWF well.  She is also taking CoQ-10 200 mg daily.  Patient reports stopping niacin around her last visit.  SH: Not a smoker; no EtOH  Diet: For breakfast, patient eats egg whites with toast every morning.  Once every couple of weeks, she may go out to eat.  For lunch, she has a peanut butter sandwich on whole wheat.  For dinner, it is usually chicken or fish (never fried) with some kind of vegetable.  She may eat pasta once in a while.  For snacks, she likes carrots or Fit and Active cookies to satisfy her sweet tooth.  She enjoys drinking iced tea with Splenda.  Since she is in the middle of remodeling her house, she has been eating out more frequently in the last few weeks.  Exercise: Patient walks her 2 dogs for about 20 minutes each night.  Medications:  Current Outpatient Prescriptions  Medication Sig Dispense Refill  . aspirin 81 MG tablet Take 81 mg by mouth daily.        . Calcium Carbonate-Vitamin D (CALCIUM 600 + D PO) 1 tablet daily.       . cholecalciferol (VITAMIN D) 1000 UNITS tablet Take 1,000 Units by mouth daily.        . Coenzyme Q10 200 MG capsule Take 200 mg by mouth daily.        . cyanocobalamin (,VITAMIN B-12,) 1000 MCG/ML injection Inject 1,000 mcg into the muscle. every six weeks       . fluticasone (FLONASE) 50 MCG/ACT nasal spray Inhale 1 puff into the lungs daily as needed.       [name] Kitchen glucosamine-chondroitin 500-400 MG tablet Take 1 tablet by mouth daily.       . irbesartan (AVAPRO) 150 MG tablet Take 1 tablet (150 mg total) by mouth daily.  90 tablet  3  . Magnesium 250 MG TABS Take 1 tablet by mouth daily.        [name] Kitchen omeprazole (PRILOSEC) 20 MG capsule Take 20 mg by mouth daily as needed.       . rosuvastatin (CRESTOR) 5 MG tablet Take 1 tablet  on Monday, Wednesday, and Friday.  30 tablet  6    Allergies:  Allergies  Allergen Reactions  . Amlodipine Besylate     REACTION: increases BP  . Atorvastatin     REACTION: myalgia; shaking muscles  . Cefuroxime Axetil     REACTION: reaction not known  . Ciprofloxacin     REACTION: ? reaction  . Crestor (Rosuvastatin Calcium)     Severe myalgias   . Epinephrine     REACTION: "put me in the hospital"; hives all over body  . Guiatuss Dm     REACTION: jittery  . Ramipril     REACTION: ? reaction  . Sulfamethoxazole W/Trimethoprim     REACTION: ? reaction

## 2012-01-07 NOTE — Patient Instructions (Signed)
Since patient has achieved her TG<150 and LDL<130, she can follow up with her primary care provider for future visits.

## 2012-01-14 ENCOUNTER — Telehealth: Payer: Self-pay | Admitting: Family Medicine

## 2012-01-14 DIAGNOSIS — H9193 Unspecified hearing loss, bilateral: Secondary | ICD-10-CM

## 2012-01-14 NOTE — Telephone Encounter (Signed)
Patient tried to schedule an appointment with [facility] for a hearing test and they told her because she has Medicare she would have to have a referral.  She's having trouble hearing.  She can hear people talk,but the words get jumbled.  Patient's requesting a referral to [facility].

## 2012-01-14 NOTE — Telephone Encounter (Signed)
I will do ref

## 2012-01-20 DIAGNOSIS — H903 Sensorineural hearing loss, bilateral: Secondary | ICD-10-CM | POA: Diagnosis not present

## 2012-01-20 DIAGNOSIS — H6120 Impacted cerumen, unspecified ear: Secondary | ICD-10-CM | POA: Diagnosis not present

## 2012-01-21 ENCOUNTER — Telehealth: Payer: Self-pay

## 2012-01-21 DIAGNOSIS — Z1211 Encounter for screening for malignant neoplasm of colon: Secondary | ICD-10-CM | POA: Insufficient documentation

## 2012-01-21 NOTE — Telephone Encounter (Signed)
done

## 2012-01-21 NOTE — Telephone Encounter (Signed)
Pt said she gets colonoscopy every 5 years and is scheduled at the end of March for colonoscopy. Pt said she is 77 yrs old and preparation for colonoscopy takes a lot out of her and after test it takes her days to feel her normal self. Pt said only hx of colon CA is her mother. Pt said she is not having any problems and pt wants to see if Dr Glori Bickers will agree that pt can cancel colonoscopy. Please advise. Pt can be reached at [phone].

## 2012-01-21 NOTE — Telephone Encounter (Signed)
You can go ahead and order this now

## 2012-01-21 NOTE — Telephone Encounter (Signed)
Let me know that day to order it - I don't think I can do it that far ahead thanks

## 2012-01-21 NOTE — Telephone Encounter (Signed)
Patient notified as instructed by telephone. Pt said she will call and cancel colonoscopy appt. Pt said thank you very much. Pt will come by on 02/04/12 to pick up IFOB when she gets her B12 injection.

## 2012-01-21 NOTE — Telephone Encounter (Signed)
That is entirely up to her -- I understand not wanting to do it - cancel if she wants to  If she is open to doing IFOB let me know and I will order that

## 2012-01-22 DIAGNOSIS — M5030 Other cervical disc degeneration, unspecified cervical region: Secondary | ICD-10-CM | POA: Diagnosis not present

## 2012-01-22 DIAGNOSIS — M999 Biomechanical lesion, unspecified: Secondary | ICD-10-CM | POA: Diagnosis not present

## 2012-01-22 DIAGNOSIS — M9981 Other biomechanical lesions of cervical region: Secondary | ICD-10-CM | POA: Diagnosis not present

## 2012-01-22 DIAGNOSIS — IMO0002 Reserved for concepts with insufficient information to code with codable children: Secondary | ICD-10-CM | POA: Diagnosis not present

## 2012-01-26 ENCOUNTER — Telehealth: Payer: Self-pay | Admitting: *Deleted

## 2012-01-26 NOTE — Telephone Encounter (Signed)
Pt is requesting a 90 day supply to [facility] on [address] in [place], [place] for her Crestor 76ms. Last OV states pt to follow up with PCP. Please call pt at [phone].

## 2012-01-27 MED ORDER — ROSUVASTATIN CALCIUM 5 MG PO TABS: ORAL_TABLET | ORAL | Status: DC

## 2012-01-27 NOTE — Telephone Encounter (Signed)
Pt aware Rx sent to pharmacy

## 2012-02-02 ENCOUNTER — Ambulatory Visit (INDEPENDENT_AMBULATORY_CARE_PROVIDER_SITE_OTHER): Payer: Medicare Other

## 2012-02-02 DIAGNOSIS — E538 Deficiency of other specified B group vitamins: Secondary | ICD-10-CM | POA: Diagnosis not present

## 2012-02-02 MED ORDER — CYANOCOBALAMIN 1000 MCG/ML IJ SOLN
1000.0000 ug | Freq: Once | INTRAMUSCULAR | Status: AC
  Administered 2012-02-02: 1000 ug via INTRAMUSCULAR

## 2012-02-05 ENCOUNTER — Telehealth: Payer: Self-pay | Admitting: *Deleted

## 2012-02-05 ENCOUNTER — Other Ambulatory Visit: Payer: Medicare Other

## 2012-02-05 DIAGNOSIS — R195 Other fecal abnormalities: Secondary | ICD-10-CM

## 2012-02-05 DIAGNOSIS — Z1211 Encounter for screening for malignant neoplasm of colon: Secondary | ICD-10-CM

## 2012-02-05 LAB — FECAL OCCULT BLOOD, IMMUNOCHEMICAL: Fecal Occult Bld: POSITIVE

## 2012-02-05 NOTE — Telephone Encounter (Signed)
Tammy Manning with [facility] called to report a positive IFOB test.  She will fax the results to Dr. Glori Bickers.

## 2012-02-05 NOTE — Telephone Encounter (Signed)
Patient notified as instructed by telephone. Pt said she is agreeable to go for GI referral.Pt will wait to hear from pt care coordinator.

## 2012-02-05 NOTE — Telephone Encounter (Signed)
Will do ref

## 2012-02-05 NOTE — Telephone Encounter (Signed)
Please let her know IFOB was pos - so I want to refer her to GI for further eval/ to talk about colonoscopy Alert me if agreeable

## 2012-02-10 ENCOUNTER — Other Ambulatory Visit: Payer: Self-pay | Admitting: Family Medicine

## 2012-02-10 NOTE — Telephone Encounter (Signed)
Px written for call in   

## 2012-02-10 NOTE — Telephone Encounter (Signed)
Rx called to [facility].

## 2012-02-16 DIAGNOSIS — M1710 Unilateral primary osteoarthritis, unspecified knee: Secondary | ICD-10-CM | POA: Diagnosis not present

## 2012-02-16 DIAGNOSIS — IMO0002 Reserved for concepts with insufficient information to code with codable children: Secondary | ICD-10-CM | POA: Diagnosis not present

## 2012-02-23 ENCOUNTER — Other Ambulatory Visit: Payer: Self-pay

## 2012-02-23 MED ORDER — ROSUVASTATIN CALCIUM 5 MG PO TABS: ORAL_TABLET | ORAL | Status: DC

## 2012-02-23 NOTE — Telephone Encounter (Signed)
Pt called Crestor 5 mg was sent on 01/27/12 to [facility] for #40 x 3. Pt said only gave her #6 tabs two different occasions and was charged $34.79 each time. I called [facility] spoke with Lattie Haw and she did have rx sent on 01/27/12 and could not explain why pt only given #6. Lattie Haw said 1 month co pay $42. And 3 month copay $126.00. Pt request 3 month supply with yr refill to [facility]. Crestor 5 mg #40 x 3 to [facility]. Pt notified sending to [facility] and pt will call [facility] and speak with Lattie Haw about previous charges.

## 2012-02-26 DIAGNOSIS — R195 Other fecal abnormalities: Secondary | ICD-10-CM | POA: Diagnosis not present

## 2012-03-03 DIAGNOSIS — Z1211 Encounter for screening for malignant neoplasm of colon: Secondary | ICD-10-CM | POA: Diagnosis not present

## 2012-03-03 DIAGNOSIS — Z800 Family history of malignant neoplasm of digestive organs: Secondary | ICD-10-CM | POA: Diagnosis not present

## 2012-03-03 DIAGNOSIS — D126 Benign neoplasm of colon, unspecified: Secondary | ICD-10-CM | POA: Diagnosis not present

## 2012-03-03 DIAGNOSIS — K5730 Diverticulosis of large intestine without perforation or abscess without bleeding: Secondary | ICD-10-CM | POA: Diagnosis not present

## 2012-03-03 DIAGNOSIS — Z86010 Personal history of colonic polyps: Secondary | ICD-10-CM | POA: Diagnosis not present

## 2012-03-03 LAB — HM COLONOSCOPY

## 2012-03-04 DIAGNOSIS — D126 Benign neoplasm of colon, unspecified: Secondary | ICD-10-CM | POA: Diagnosis not present

## 2012-03-04 DIAGNOSIS — K5730 Diverticulosis of large intestine without perforation or abscess without bleeding: Secondary | ICD-10-CM | POA: Diagnosis not present

## 2012-03-04 DIAGNOSIS — Z800 Family history of malignant neoplasm of digestive organs: Secondary | ICD-10-CM | POA: Diagnosis not present

## 2012-03-24 ENCOUNTER — Encounter: Payer: Self-pay | Admitting: Family Medicine

## 2012-03-29 ENCOUNTER — Encounter: Payer: Self-pay | Admitting: Family Medicine

## 2012-03-30 ENCOUNTER — Ambulatory Visit: Payer: Medicare Other

## 2012-04-08 ENCOUNTER — Encounter: Payer: Self-pay | Admitting: Family Medicine

## 2012-04-08 DIAGNOSIS — Z1231 Encounter for screening mammogram for malignant neoplasm of breast: Secondary | ICD-10-CM | POA: Diagnosis not present

## 2012-04-11 ENCOUNTER — Encounter: Payer: Self-pay | Admitting: *Deleted

## 2012-04-20 DIAGNOSIS — L570 Actinic keratosis: Secondary | ICD-10-CM | POA: Diagnosis not present

## 2012-04-20 DIAGNOSIS — D239 Other benign neoplasm of skin, unspecified: Secondary | ICD-10-CM | POA: Diagnosis not present

## 2012-04-20 DIAGNOSIS — D485 Neoplasm of uncertain behavior of skin: Secondary | ICD-10-CM | POA: Diagnosis not present

## 2012-04-20 DIAGNOSIS — D2360 Other benign neoplasm of skin of unspecified upper limb, including shoulder: Secondary | ICD-10-CM | POA: Diagnosis not present

## 2012-04-20 DIAGNOSIS — L821 Other seborrheic keratosis: Secondary | ICD-10-CM | POA: Diagnosis not present

## 2012-04-20 DIAGNOSIS — Q828 Other specified congenital malformations of skin: Secondary | ICD-10-CM | POA: Diagnosis not present

## 2012-04-20 DIAGNOSIS — Z85828 Personal history of other malignant neoplasm of skin: Secondary | ICD-10-CM | POA: Diagnosis not present

## 2012-05-16 ENCOUNTER — Encounter: Payer: Self-pay | Admitting: Family Medicine

## 2012-05-16 ENCOUNTER — Ambulatory Visit (INDEPENDENT_AMBULATORY_CARE_PROVIDER_SITE_OTHER): Payer: Medicare Other | Admitting: Family Medicine

## 2012-05-16 VITALS — BP 140/74 | HR 72 | Temp 98.3°F | Ht 65.0 in | Wt 210.3 lb

## 2012-05-16 DIAGNOSIS — L255 Unspecified contact dermatitis due to plants, except food: Secondary | ICD-10-CM

## 2012-05-16 DIAGNOSIS — E538 Deficiency of other specified B group vitamins: Secondary | ICD-10-CM

## 2012-05-16 DIAGNOSIS — L237 Allergic contact dermatitis due to plants, except food: Secondary | ICD-10-CM

## 2012-05-16 MED ORDER — CYANOCOBALAMIN 1000 MCG/ML IJ SOLN
1000.0000 ug | Freq: Once | INTRAMUSCULAR | Status: AC
  Administered 2012-05-16: 1000 ug via INTRAMUSCULAR

## 2012-05-16 MED ORDER — CLOBETASOL PROPIONATE 0.05 % EX OINT: TOPICAL_OINTMENT | Freq: Two times a day (BID) | CUTANEOUS | Status: DC

## 2012-05-16 NOTE — Progress Notes (Signed)
   Therapist, music at [facility] [place] [address] Phone: [phone] Fax: [phone]   Patient Name: Tammy Manning Date of Birth: 1935/02/25 Age: 77 y.o. Medical Record Number: [id number] Gender: female Date of Encounter: 05/16/2012  History of Present Illness:  Tammy Manning is a 77 y.o. very pleasant female patient who presents with the following:  4-5 day history of itchy rash, posterior lower back. Was doing yard work and placed hands on back and bottom. Now very itchy. Has been placing alcohol on it without much relief.  B12 def. Needs inj  Past Medical History, Surgical History, Social History, Family History, Problem List, Medications, and Allergies have been reviewed and updated if relevant.  Prior to Admission medications   Medication Sig Start Date End Date Taking? Authorizing Provider  aspirin 81 MG tablet Take 81 mg by mouth daily.     Yes Historical Provider, MD  Calcium Carbonate-Vitamin D (CALCIUM 600 + D PO) 1 tablet daily.    Yes Historical Provider, MD  cholecalciferol (VITAMIN D) 1000 UNITS tablet Take 1,000 Units by mouth daily.     Yes Historical Provider, MD  clorazepate (TRANXENE) 7.5 MG tablet TAKE 1 TABLET BY MOUTH EVERY DAY AS NEEDED 02/10/12  Yes Abner Greenspan, MD  Coenzyme Q10 200 MG capsule Take 200 mg by mouth daily.     Yes Historical Provider, MD  cyanocobalamin (,VITAMIN B-12,) 1000 MCG/ML injection Inject 1,000 mcg into the muscle. every six weeks    Yes Historical Provider, MD  fluticasone (FLONASE) 50 MCG/ACT nasal spray Inhale 1 puff into the lungs daily as needed.  09/20/11  Yes Historical Provider, MD  glucosamine-chondroitin 500-400 MG tablet Take 1 tablet by mouth daily.    Yes Historical Provider, MD  irbesartan (AVAPRO) 150 MG tablet Take 1 tablet (150 mg total) by mouth daily. 12/16/11  Yes Abner Greenspan, MD  Magnesium 250 MG TABS Take 1 tablet by mouth daily.     Yes Historical Provider, MD  omeprazole  (PRILOSEC) 20 MG capsule Take 20 mg by mouth daily as needed.    Yes Historical Provider, MD  rosuvastatin (CRESTOR) 5 MG tablet Take 1 tablet on Monday, Wednesday, and Friday. 02/23/12  Yes Abner Greenspan, MD    Review of Systems:  GEN: No acute illnesses, no fevers, chills. GI: No n/v/d, eating normally Pulm: No SOB Interactive and getting along well at home.  Otherwise, ROS is as per the HPI.   Physical Examination: Filed Vitals:   05/16/12 0928  BP: 140/74  Pulse: 72  Temp: 98.3 F (36.8 C)   Filed Vitals:   05/16/12 0928  Height: 5' 5"  (1.651 m)  Weight: 210 lb 5 oz (95.397 kg)   Body mass index is 35.00 kg/(m^2). Ideal Body Weight: Weight in (lb) to have BMI = 25: 149.9    GEN: WDWN, NAD, Non-toxic, Alert & Oriented x 3 HEENT: Atraumatic, Normocephalic.  Ears and Nose: No external deformity. EXTR: No clubbing/cyanosis/edema NEURO: Normal gait.  PSYCH: Normally interactive. Conversant. Not depressed or anxious appearing.  Calm demeanor.  SKIN: posterior low back, linear line, small vesicles and redness  Assessment and Plan: 1. Poison ivy    2. B12 deficiency  cyanocobalamin ((VITAMIN B-12)) injection 1,000 mcg    Clobetasol and b12  Owens Loffler, MD

## 2012-07-06 ENCOUNTER — Ambulatory Visit (INDEPENDENT_AMBULATORY_CARE_PROVIDER_SITE_OTHER): Payer: Medicare Other

## 2012-07-06 DIAGNOSIS — E538 Deficiency of other specified B group vitamins: Secondary | ICD-10-CM

## 2012-07-06 MED ORDER — CYANOCOBALAMIN 1000 MCG/ML IJ SOLN
1000.0000 ug | Freq: Once | INTRAMUSCULAR | Status: AC
  Administered 2012-07-06: 1000 ug via INTRAMUSCULAR

## 2012-08-05 DIAGNOSIS — Z23 Encounter for immunization: Secondary | ICD-10-CM | POA: Diagnosis not present

## 2012-08-18 ENCOUNTER — Ambulatory Visit (INDEPENDENT_AMBULATORY_CARE_PROVIDER_SITE_OTHER): Payer: Medicare Other

## 2012-08-18 DIAGNOSIS — E538 Deficiency of other specified B group vitamins: Secondary | ICD-10-CM | POA: Diagnosis not present

## 2012-08-18 MED ORDER — CYANOCOBALAMIN 1000 MCG/ML IJ SOLN
1000.0000 ug | Freq: Once | INTRAMUSCULAR | Status: AC
  Administered 2012-08-18: 1000 ug via INTRAMUSCULAR

## 2012-08-26 ENCOUNTER — Ambulatory Visit: Payer: Medicare Other | Admitting: Family Medicine

## 2012-08-29 ENCOUNTER — Encounter: Payer: Self-pay | Admitting: Family Medicine

## 2012-08-29 ENCOUNTER — Ambulatory Visit (INDEPENDENT_AMBULATORY_CARE_PROVIDER_SITE_OTHER): Payer: Medicare Other | Admitting: Family Medicine

## 2012-08-29 VITALS — BP 142/76 | HR 80 | Temp 98.0°F | Wt 210.8 lb

## 2012-08-29 DIAGNOSIS — M79609 Pain in unspecified limb: Secondary | ICD-10-CM

## 2012-08-29 DIAGNOSIS — M79605 Pain in left leg: Secondary | ICD-10-CM

## 2012-08-29 NOTE — Patient Instructions (Addendum)
I don't think this left leg pain is due to blood clot but rather flare of knee arthritis.  May continue ice and use tylenol for knee pain. If worsening swelling of left side, or redness or other concerns, please let us know. Let's get a D dimer to help rule out blood clot.

## 2012-08-29 NOTE — Assessment & Plan Note (Addendum)
Risk factors for DVT include weight and recent prolonged car ride. Anticipate this pain/discomfort more due to flare of knee OA in pt with known DJD. Treat with tylenol prn, to update [place] if sxs not improving as expected. Discussed sxs of DVT. Given story and description of pain, check D dimer.  [phone] (H)

## 2012-08-29 NOTE — Progress Notes (Signed)
  Subjective:    Patient ID: Tammy Manning, female    DOB: [birth year], 77 y.o.   MRN: [id number]  HPI CC: L knee pain  This past weekend spent a total of 11 hours in car.  Has noted swelling present.  Feels "strange" and some paresthesias in popliteal area.  Tightness endorsed.  Did ice knee.  Hasn't tried medicines for this.  Notices left knee and lower leg more swollen.  + some warmth noted worse yesterday.  No chest pain/SOB, dizziness.  No personal or fmhx blood clots. Not on hormonal medicines. Recently traveled to beach then to [employer]. Taking aspirin daily. No smokers at home.  Tries to stay busy/active by walking daily. H/o knee OA, worse on left.  Past Medical History  Diagnosis Date  . Cancer     Skin, basal cell on nose and eyelid  . Hyperlipidemia   . Arthritis     Knees  . DJD (degenerative joint disease)     Low back  . Dyspepsia   . B12 deficiency     Mild  . Lactose intolerance   . Mixed incontinence   . Vulvar irritation     from urine pad, uses Triamcinolone oint.    Review of Systems Per HPI    Objective:   Physical Exam  Nursing note and vitals reviewed. Constitutional: She appears well-developed and well-nourished. No distress.  Musculoskeletal: She exhibits no edema.       Mild discomfort at knee with McMurray's on left Neg drawer test. No deformity or tenderness to palpation. No palpable cords. Neg PFgrind bilaterally. No abnormal patellar motility  Circumferentially: R calf 40.5cm, L calf 40cm  Skin: Skin is warm and dry. No rash noted.  Psychiatric: She has a normal mood and affect.      Assessment & Plan:

## 2012-08-30 LAB — D-DIMER, QUANTITATIVE: D-Dimer, Quant: 0.4 ug/mL-FEU (ref 0.00–0.48)

## 2012-09-12 ENCOUNTER — Ambulatory Visit (INDEPENDENT_AMBULATORY_CARE_PROVIDER_SITE_OTHER): Payer: Medicare Other | Admitting: Family Medicine

## 2012-09-12 ENCOUNTER — Encounter: Payer: Self-pay | Admitting: Family Medicine

## 2012-09-12 VITALS — BP 124/84 | HR 68 | Temp 97.5°F | Ht 65.0 in | Wt 208.8 lb

## 2012-09-12 DIAGNOSIS — M25569 Pain in unspecified knee: Secondary | ICD-10-CM | POA: Diagnosis not present

## 2012-09-12 DIAGNOSIS — M25562 Pain in left knee: Secondary | ICD-10-CM | POA: Insufficient documentation

## 2012-09-12 DIAGNOSIS — E785 Hyperlipidemia, unspecified: Secondary | ICD-10-CM

## 2012-09-12 DIAGNOSIS — I10 Essential (primary) hypertension: Secondary | ICD-10-CM | POA: Diagnosis not present

## 2012-09-12 LAB — COMPREHENSIVE METABOLIC PANEL
ALT: 14 U/L (ref 0–35)
AST: 17 U/L (ref 0–37)
Albumin: 3.7 g/dL (ref 3.5–5.2)
Alkaline Phosphatase: 51 U/L (ref 39–117)
BUN: 17 mg/dL (ref 6–23)
CO2: 27 mEq/L (ref 19–32)
Calcium: 9.2 mg/dL (ref 8.4–10.5)
Chloride: 107 mEq/L (ref 96–112)
Creatinine, Ser: 0.8 mg/dL (ref 0.4–1.2)
GFR: 72.14 mL/min (ref >60.00)
Glucose, Bld: 86 mg/dL (ref 70–99)
Potassium: 4.3 mEq/L (ref 3.5–5.1)
Sodium: 141 mEq/L (ref 135–145)
Total Bilirubin: 0.3 mg/dL (ref 0.3–1.2)
Total Protein: 6.8 g/dL (ref 6.0–8.3)

## 2012-09-12 LAB — LIPID PANEL
Cholesterol: 224 mg/dL — ABNORMAL HIGH (ref 0–200)
HDL: 60.4 mg/dL (ref >39.00)
Total CHOL/HDL Ratio: 4
Triglycerides: 151 mg/dL — ABNORMAL HIGH (ref 0.0–149.0)
VLDL: 30.2 mg/dL (ref 0.0–40.0)

## 2012-09-12 LAB — TSH: TSH: 2.31 u[IU]/mL (ref 0.35–5.50)

## 2012-09-12 LAB — LDL CHOLESTEROL, DIRECT: Direct LDL: 125 mg/dL

## 2012-09-12 NOTE — Assessment & Plan Note (Signed)
Ongoing problem with L knee- patellar pain and some medial joint line tenderness She is extremely frustrated Has had inj and tx from Dr Maxie Better in the past - we will refer

## 2012-09-12 NOTE — Assessment & Plan Note (Signed)
On crestor every other day Lab today Per pt - diet gets better and better Commended on that

## 2012-09-12 NOTE — Assessment & Plan Note (Signed)
bp in fair control at this time  No changes needed  Disc lifstyle change with low sodium diet and exercise   Lab today

## 2012-09-12 NOTE — Progress Notes (Signed)
Subjective:    Patient ID: Tammy Manning, female    DOB: 06-26-1935, 77 y.o.   MRN: [id number]  HPI Here for f/u of chronic conditions   bp is stable today  No cp or palpitations or headaches or edema  No side effects to medicines  BP Readings from Last 3 Encounters:  09/12/12 124/84  08/29/12 142/76  05/16/12 140/74     Wt is down 2 lb   Pt was here for leg pain -- recently  Had neg D Dimer Her knee -- patellar tendon area catches easily -- and that causes loss of balance briefly like it "goes out on her" Tried to get appt with Dr Maxie Better  Is waiting for a call back - they have not called back and she is aggrivated  Is very much affecting her life  Is also hurting her back because she walks funny  Had ridden in a car before hand   She has had shots in both knees in the past for some arthritis Also does her leg exercise  No swelling or redness  Lab Results  Component Value Date   CHOL 207* 12/31/2011   HDL 66.70 12/31/2011   [place] 95 09/24/2010   LDLDIRECT 119.3 12/31/2011   TRIG 122.0 12/31/2011   CHOLHDL 3 12/31/2011    Time to check her blood work   Did have her flu shot already   Patient Active Problem List  Diagnosis  . B12 DEFICIENCY  . HYPERLIPIDEMIA  . STRESS REACTION, ACUTE, WITH EMOTIONAL DISTURBANCE  . HYPERTENSION, ESSENTIAL NOS  . DYSPEPSIA  . CONSTIPATION  . IBS  . BREAST MASS, LEFT  . POSTMENOPAUSAL STATUS  . DERMATITIS, ATOPIC  . LEG PAIN, RIGHT  . HEADACHE, CHRONIC  . SKIN CANCER, HX OF  . Adverse drug reaction  . Hearing loss of both ears  . Special screening for malignant neoplasms, colon  . Heme positive stool  . Left leg pain   Past Medical History  Diagnosis Date  . Cancer     Skin, basal cell on nose and eyelid  . Hyperlipidemia   . Arthritis     Knees  . DJD (degenerative joint disease)     Low back  . Dyspepsia   . B12 deficiency     Mild  . Lactose intolerance   . Mixed incontinence   . Vulvar irritation     from urine  pad, uses Triamcinolone oint.   Past Surgical History  Procedure Date  . Abdominal hysterectomy 1982    Large fibroids and bleeding  . Bilateral salpingoophorectomy 1986  . Bone graft hip iliac crest     To Left arm  . Appendectomy   . Breast reduction surgery   . Hemorrhoid surgery 2006  . Esophagogastroduodenoscopy     Polyps  . Skin cancer eyelid 2012   History  Substance Use Topics  . Smoking status: Former Smoker    Quit date: 11/24/1963  . Smokeless tobacco: Never Used  . Alcohol Use: Yes     Rare   Family History  Problem Relation Age of Onset  . Cancer Mother     Colon  . Heart disease Father     CAD  . Diabetes Sister   . Diabetes Brother   . Leukemia Brother   . Cancer Paternal Aunt     Breast  . Diabetes Sister    Allergies  Allergen Reactions  . Amlodipine Besylate     REACTION: increases BP  . Atorvastatin  REACTION: myalgia; shaking muscles  . Cefuroxime Axetil     REACTION: reaction not known  . Ciprofloxacin     REACTION: ? reaction  . Crestor (Rosuvastatin Calcium)     Severe myalgias   . Dextromethorphan-Guaifenesin     REACTION: jittery  . Epinephrine     REACTION: "put me in the hospital"; hives all over body  . Ramipril     REACTION: ? reaction  . Sulfamethoxazole W-Trimethoprim     REACTION: ? reaction   Current Outpatient Prescriptions on File Prior to Visit  Medication Sig Dispense Refill  . aspirin 81 MG tablet Take 81 mg by mouth daily.        . Calcium Carbonate-Vitamin D (CALCIUM 600 + D PO) 1 tablet daily.       . cholecalciferol (VITAMIN D) 1000 UNITS tablet Take 1,000 Units by mouth daily.        . clobetasol ointment (TEMOVATE) 0.05 % Apply topically 2 (two) times daily.  60 g  0  . clorazepate (TRANXENE) 7.5 MG tablet TAKE 1 TABLET BY MOUTH EVERY DAY AS NEEDED  30 tablet  0  . Coenzyme Q10 200 MG capsule Take 200 mg by mouth daily.        . cyanocobalamin (,VITAMIN B-12,) 1000 MCG/ML injection Inject 1,000 mcg into  the muscle. every six weeks       . irbesartan (AVAPRO) 150 MG tablet Take 1 tablet (150 mg total) by mouth daily.  90 tablet  3  . omeprazole (PRILOSEC) 20 MG capsule Take 20 mg by mouth daily as needed.       . rosuvastatin (CRESTOR) 5 MG tablet Take 1 tablet on Monday, Wednesday, and Friday.  40 tablet  3     Review of Systems Review of Systems  Constitutional: Negative for fever, appetite change, fatigue and unexpected weight change.  Eyes: Negative for pain and visual disturbance.  Respiratory: Negative for cough and shortness of breath.   Cardiovascular: Negative for cp or palpitations    Gastrointestinal: Negative for nausea, diarrhea and constipation.  Genitourinary: Negative for urgency and frequency.  Skin: Negative for pallor or rash   MSK pos for knee pain without swelling Neurological: Negative for weakness, light-headedness, numbness and headaches.  Hematological: Negative for adenopathy. Does not bruise/bleed easily.  Psychiatric/Behavioral: Negative for dysphoric mood. The patient is not nervous/anxious.         Objective:   Physical Exam  Constitutional: She appears well-developed and well-nourished. No distress.       obese and well appearing   HENT:  Head: Normocephalic and atraumatic.  Eyes: Conjunctivae normal and EOM are normal. Pupils are equal, round, and reactive to light. No scleral icterus.  Neck: Normal range of motion. Neck supple. No JVD present. Carotid bruit is not present. No thyromegaly present.  Cardiovascular: Normal rate, regular rhythm, normal heart sounds and intact distal pulses.  Exam reveals no gallop.   Pulmonary/Chest: Effort normal and breath sounds normal. No respiratory distress. She has no wheezes.  Abdominal: Soft. Bowel sounds are normal. She exhibits no distension, no abdominal bruit and no mass. There is no tenderness.  Musculoskeletal: She exhibits tenderness. She exhibits no edema.       L knee- tender over patellar tendon and  also medial joint line Mild crepitus Pos mc murray  Neg patellar grind No swelling or effusion noted  Lymphadenopathy:    She has no cervical adenopathy.  Neurological: She is alert. She has normal reflexes.  Skin: Skin is warm and dry. No rash noted. No erythema. No pallor.  Psychiatric: She has a normal mood and affect.          Assessment & Plan:

## 2012-09-12 NOTE — Patient Instructions (Addendum)
We will do referral to orthopedics at check out - talk to [facility] today Blood pressure is good Keep eating a healthy diet and working on exercise

## 2012-09-13 ENCOUNTER — Encounter: Payer: Self-pay | Admitting: *Deleted

## 2012-09-14 DIAGNOSIS — M25569 Pain in unspecified knee: Secondary | ICD-10-CM | POA: Diagnosis not present

## 2012-10-04 ENCOUNTER — Ambulatory Visit (INDEPENDENT_AMBULATORY_CARE_PROVIDER_SITE_OTHER): Payer: Medicare Other | Admitting: *Deleted

## 2012-10-04 DIAGNOSIS — E538 Deficiency of other specified B group vitamins: Secondary | ICD-10-CM

## 2012-10-04 MED ORDER — CYANOCOBALAMIN 1000 MCG/ML IJ SOLN
1000.0000 ug | Freq: Once | INTRAMUSCULAR | Status: AC
  Administered 2012-10-04: 1000 ug via INTRAMUSCULAR

## 2012-10-28 ENCOUNTER — Other Ambulatory Visit: Payer: Self-pay | Admitting: Family Medicine

## 2012-10-28 ENCOUNTER — Other Ambulatory Visit: Payer: Self-pay | Admitting: *Deleted

## 2012-10-28 MED ORDER — CLORAZEPATE DIPOTASSIUM 7.5 MG PO TABS: 7.5000 mg | ORAL_TABLET | Freq: Every day | ORAL | Status: DC | PRN

## 2012-10-28 NOTE — Telephone Encounter (Signed)
Faxed refill request, ok to refill?

## 2012-10-28 NOTE — Telephone Encounter (Signed)
Px written for call in   

## 2012-10-28 NOTE — Telephone Encounter (Signed)
Rx called in as prescribed 

## 2012-11-09 ENCOUNTER — Other Ambulatory Visit: Payer: Self-pay | Admitting: *Deleted

## 2012-11-09 MED ORDER — IRBESARTAN 150 MG PO TABS: 150.0000 mg | ORAL_TABLET | Freq: Every day | ORAL | Status: DC

## 2012-11-11 ENCOUNTER — Ambulatory Visit (INDEPENDENT_AMBULATORY_CARE_PROVIDER_SITE_OTHER): Payer: Medicare Other | Admitting: *Deleted

## 2012-11-11 DIAGNOSIS — E538 Deficiency of other specified B group vitamins: Secondary | ICD-10-CM | POA: Diagnosis not present

## 2012-11-11 MED ORDER — CYANOCOBALAMIN 1000 MCG/ML IJ SOLN
1000.0000 ug | Freq: Once | INTRAMUSCULAR | Status: AC
  Administered 2012-11-11: 1000 ug via INTRAMUSCULAR

## 2012-11-15 ENCOUNTER — Emergency Department: Payer: Self-pay | Admitting: Emergency Medicine

## 2012-11-15 DIAGNOSIS — Z79899 Other long term (current) drug therapy: Secondary | ICD-10-CM | POA: Diagnosis not present

## 2012-11-15 DIAGNOSIS — Z888 Allergy status to other drugs, medicaments and biological substances status: Secondary | ICD-10-CM | POA: Diagnosis not present

## 2012-11-15 DIAGNOSIS — R000 Tachycardia, unspecified: Secondary | ICD-10-CM | POA: Diagnosis not present

## 2012-11-15 DIAGNOSIS — E785 Hyperlipidemia, unspecified: Secondary | ICD-10-CM | POA: Diagnosis not present

## 2012-11-15 DIAGNOSIS — M79609 Pain in unspecified limb: Secondary | ICD-10-CM | POA: Diagnosis not present

## 2012-11-15 DIAGNOSIS — I10 Essential (primary) hypertension: Secondary | ICD-10-CM | POA: Diagnosis not present

## 2012-11-15 DIAGNOSIS — I4891 Unspecified atrial fibrillation: Secondary | ICD-10-CM | POA: Diagnosis not present

## 2012-11-15 DIAGNOSIS — R6889 Other general symptoms and signs: Secondary | ICD-10-CM | POA: Diagnosis not present

## 2012-11-15 DIAGNOSIS — R0789 Other chest pain: Secondary | ICD-10-CM | POA: Diagnosis not present

## 2012-11-15 LAB — COMPREHENSIVE METABOLIC PANEL
Albumin: 3.3 g/dL — ABNORMAL LOW (ref 3.4–5.0)
Alkaline Phosphatase: 62 U/L (ref 50–136)
Anion Gap: 5 — ABNORMAL LOW (ref 7–16)
BUN: 19 mg/dL — ABNORMAL HIGH (ref 7–18)
Bilirubin,Total: 0.3 mg/dL (ref 0.2–1.0)
Calcium, Total: 8.7 mg/dL (ref 8.5–10.1)
Chloride: 112 mmol/L — ABNORMAL HIGH (ref 98–107)
Co2: 25 mmol/L (ref 21–32)
Creatinine: 0.72 mg/dL (ref 0.60–1.30)
EGFR (African American): >60
EGFR (Non-African Amer.): >60
Glucose: 98 mg/dL (ref 65–99)
Osmolality: 285 (ref 275–301)
Potassium: 4.2 mmol/L (ref 3.5–5.1)
SGOT(AST): 21 U/L (ref 15–37)
SGPT (ALT): 16 U/L (ref 12–78)
Sodium: 142 mmol/L (ref 136–145)
Total Protein: 6.4 g/dL (ref 6.4–8.2)

## 2012-11-15 LAB — CBC
HCT: 38.3 % (ref 35.0–47.0)
HGB: 12.9 g/dL (ref 12.0–16.0)
MCH: 30.9 pg (ref 26.0–34.0)
MCHC: 33.7 g/dL (ref 32.0–36.0)
MCV: 92 fL (ref 80–100)
Platelet: 260 10*3/uL (ref 150–440)
RBC: 4.18 10*6/uL (ref 3.80–5.20)
RDW: 14.2 % (ref 11.5–14.5)
WBC: 7.7 10*3/uL (ref 3.6–11.0)

## 2012-11-15 LAB — TROPONIN I: Troponin-I: <0.02 ng/mL

## 2012-11-15 LAB — TSH: Thyroid Stimulating Horm: 3.47 u[IU]/mL

## 2012-11-17 ENCOUNTER — Encounter: Payer: Self-pay | Admitting: Family Medicine

## 2012-11-17 ENCOUNTER — Ambulatory Visit (INDEPENDENT_AMBULATORY_CARE_PROVIDER_SITE_OTHER): Payer: Medicare Other | Admitting: Family Medicine

## 2012-11-17 VITALS — BP 130/64 | HR 70 | Temp 97.8°F | Ht 65.0 in | Wt 213.8 lb

## 2012-11-17 DIAGNOSIS — I4891 Unspecified atrial fibrillation: Secondary | ICD-10-CM

## 2012-11-17 DIAGNOSIS — M25569 Pain in unspecified knee: Secondary | ICD-10-CM | POA: Diagnosis not present

## 2012-11-17 DIAGNOSIS — I499 Cardiac arrhythmia, unspecified: Secondary | ICD-10-CM | POA: Diagnosis not present

## 2012-11-17 DIAGNOSIS — I480 Paroxysmal atrial fibrillation: Secondary | ICD-10-CM

## 2012-11-17 NOTE — Assessment & Plan Note (Signed)
New first episode brief on 12/24-now in NSR  Asymptomatic currently Sent for ER records  Ref to [place] asa to 325 mg  Update if not starting to improve in a week or if worsening   Will call if symptoms reoccur

## 2012-11-17 NOTE — Progress Notes (Signed)
Subjective:    Patient ID: Tammy Manning, female    DOB: June 08, 1935, 77 y.o.   MRN: [id number]  HPI Tammy Manning to ER on 12/24- had woken up at 1 am feeling strange Felt her own pulse and it was irregular  Going fast and also irregular  No cp or sob or sweating -felt strange  Woke her husband up  Went to [facility] and they put her on a monitor - and while getting on the stretcher - and her heart went back into rhythm upon transport  The readout from the ambulance- and she was in afib very briefly   She has been under a lot of stress due to a knee problem  Had not drank any alcohol   EKG today has rate in the 70s and it is normal- NSR  They drew blood and did a doppler in her leg-negative   She ruled out for MI   Mother and brother had afib in the past  She herself was hospitalized for tachycardia- years ago - due to med rxn   Several years ago she saw Dr Coralie Keens- was having heart flutters at that time  Did not find anything at that time      Patient Active Problem List  Diagnosis  . B12 DEFICIENCY  . HYPERLIPIDEMIA  . STRESS REACTION, ACUTE, WITH EMOTIONAL DISTURBANCE  . HYPERTENSION, ESSENTIAL NOS  . DYSPEPSIA  . CONSTIPATION  . IBS  . BREAST MASS, LEFT  . POSTMENOPAUSAL STATUS  . DERMATITIS, ATOPIC  . LEG PAIN, RIGHT  . HEADACHE, CHRONIC  . SKIN CANCER, HX OF  . Adverse drug reaction  . Hearing loss of both ears  . Special screening for malignant neoplasms, colon  . Heme positive stool  . Left leg pain  . Left knee pain   Past Medical History  Diagnosis Date  . Cancer     Skin, basal cell on nose and eyelid  . Hyperlipidemia   . Arthritis     Knees  . DJD (degenerative joint disease)     Low back  . Dyspepsia   . B12 deficiency     Mild  . Lactose intolerance   . Mixed incontinence   . Vulvar irritation     from urine pad, uses Triamcinolone oint.   Past Surgical History  Procedure Date  . Abdominal hysterectomy 1982    Large fibroids and bleeding   . Bilateral salpingoophorectomy 1986  . Bone graft hip iliac crest     To Left arm  . Appendectomy   . Breast reduction surgery   . Hemorrhoid surgery 2006  . Esophagogastroduodenoscopy     Polyps  . Skin cancer eyelid 2012   History  Substance Use Topics  . Smoking status: Former Smoker    Quit date: 11/24/1963  . Smokeless tobacco: Never Used  . Alcohol Use: Yes     Comment: Rare   Family History  Problem Relation Age of Onset  . Cancer Mother     Colon  . Heart disease Father     CAD  . Diabetes Sister   . Diabetes Brother   . Leukemia Brother   . Cancer Paternal Aunt     Breast  . Diabetes Sister    Allergies  Allergen Reactions  . Amlodipine Besylate     REACTION: increases BP  . Atorvastatin     REACTION: myalgia; shaking muscles  . Cefuroxime Axetil     REACTION: reaction not known  . Ciprofloxacin  REACTION: ? reaction  . Crestor (Rosuvastatin Calcium)     Severe myalgias   . Dextromethorphan-Guaifenesin     REACTION: jittery  . Epinephrine     REACTION: "put me in the hospital"; hives all over body  . Ramipril     REACTION: ? reaction  . Sulfamethoxazole W-Trimethoprim     REACTION: ? reaction   Current Outpatient Prescriptions on File Prior to Visit  Medication Sig Dispense Refill  . aspirin 81 MG tablet Take 81 mg by mouth daily.        . Calcium Carbonate-Vitamin D (CALCIUM 600 + D PO) 1 tablet daily.       . cholecalciferol (VITAMIN D) 1000 UNITS tablet Take 1,000 Units by mouth daily.        . clorazepate (TRANXENE) 7.5 MG tablet Take 1 tablet (7.5 mg total) by mouth daily as needed for anxiety.  30 tablet  0  . Coenzyme Q10 200 MG capsule Take 200 mg by mouth daily.        . cyanocobalamin (,VITAMIN B-12,) 1000 MCG/ML injection Inject 1,000 mcg into the muscle. every six weeks       . irbesartan (AVAPRO) 150 MG tablet Take 1 tablet (150 mg total) by mouth daily.  90 tablet  1  . omeprazole (PRILOSEC) 20 MG capsule       . rosuvastatin  (CRESTOR) 5 MG tablet Take 1 tablet on Monday, Wednesday, and Friday.  40 tablet  3     Review of Systems Review of Systems  Constitutional: Negative for fever, appetite change, fatigue and unexpected weight change.  Eyes: Negative for pain and visual disturbance.  Respiratory: Negative for cough and shortness of breath.   Cardiovascular: Negative for cp or palpitations   (now feels fine) , no PND or orthopnea  Gastrointestinal: Negative for nausea, diarrhea and constipation.  Genitourinary: Negative for urgency and frequency.  Skin: Negative for pallor or rash   Neurological: Negative for weakness, light-headedness, numbness and headaches.  Hematological: Negative for adenopathy. Does not bruise/bleed easily.  Psychiatric/Behavioral: Negative for dysphoric mood. The patient is not nervous/anxious.         Objective:   Physical Exam  Constitutional: She appears well-developed and well-nourished. No distress.  HENT:  Head: Normocephalic and atraumatic.  Mouth/Throat: Oropharynx is clear and moist.  Eyes: Conjunctivae normal and EOM are normal. Pupils are equal, round, and reactive to light. Right eye exhibits no discharge. Left eye exhibits no discharge.  Neck: Normal range of motion. Neck supple. No JVD present. Carotid bruit is not present. No thyromegaly present.  Cardiovascular: Normal rate, regular rhythm, normal heart sounds and intact distal pulses.  Exam reveals no gallop.   Pulmonary/Chest: Effort normal and breath sounds normal. No respiratory distress. She has no wheezes.  Abdominal: Soft. Bowel sounds are normal. She exhibits no distension, no abdominal bruit and no mass. There is no tenderness.  Musculoskeletal: She exhibits no edema.  Lymphadenopathy:    She has no cervical adenopathy.  Neurological: She is alert. She has normal reflexes. No cranial nerve deficit. She exhibits normal muscle tone. Coordination normal.  Skin: Skin is warm and dry. No rash noted. No  erythema. No pallor.  Psychiatric: She has a normal mood and affect.          Assessment & Plan:

## 2012-11-17 NOTE — Patient Instructions (Addendum)
Please send for records from [facility] ER from 12/24 We will refer you to cardiology at check out Increase aspirin to 325 mg daily with food  If symptoms reoccur, please let us know

## 2012-11-21 ENCOUNTER — Ambulatory Visit (INDEPENDENT_AMBULATORY_CARE_PROVIDER_SITE_OTHER): Payer: Medicare Other | Admitting: Cardiovascular Disease

## 2012-11-21 ENCOUNTER — Encounter: Payer: Self-pay | Admitting: Cardiovascular Disease

## 2012-11-21 VITALS — BP 160/80 | HR 77 | Ht 65.0 in | Wt 209.5 lb

## 2012-11-21 DIAGNOSIS — I4891 Unspecified atrial fibrillation: Secondary | ICD-10-CM

## 2012-11-21 DIAGNOSIS — R0602 Shortness of breath: Secondary | ICD-10-CM | POA: Diagnosis not present

## 2012-11-21 DIAGNOSIS — R002 Palpitations: Secondary | ICD-10-CM

## 2012-11-21 DIAGNOSIS — R000 Tachycardia, unspecified: Secondary | ICD-10-CM | POA: Diagnosis not present

## 2012-11-21 DIAGNOSIS — M1710 Unilateral primary osteoarthritis, unspecified knee: Secondary | ICD-10-CM | POA: Diagnosis not present

## 2012-11-21 NOTE — Patient Instructions (Addendum)
Your physician has requested that you have an echocardiogram. Echocardiography is a painless test that uses sound waves to create images of your heart. It provides your doctor with information about the size and shape of your heart and how well your heart's chambers and valves are working. This procedure takes approximately one hour. There are no restrictions for this procedure.  E. Cardio for 30 days.   Follow up after tests.

## 2012-11-22 ENCOUNTER — Encounter (INDEPENDENT_AMBULATORY_CARE_PROVIDER_SITE_OTHER): Payer: Medicare Other

## 2012-11-22 DIAGNOSIS — I4891 Unspecified atrial fibrillation: Secondary | ICD-10-CM | POA: Diagnosis not present

## 2012-11-22 NOTE — Progress Notes (Unsigned)
ecardio monitor ordered

## 2012-11-24 DIAGNOSIS — M25569 Pain in unspecified knee: Secondary | ICD-10-CM | POA: Diagnosis not present

## 2012-11-24 DIAGNOSIS — I4891 Unspecified atrial fibrillation: Secondary | ICD-10-CM | POA: Diagnosis not present

## 2012-11-26 ENCOUNTER — Encounter: Payer: Self-pay | Admitting: Cardiovascular Disease

## 2012-11-26 DIAGNOSIS — R002 Palpitations: Secondary | ICD-10-CM | POA: Insufficient documentation

## 2012-11-26 NOTE — Assessment & Plan Note (Signed)
The patient had a recent episode of palpitation and tachycardia. She was told that there was atrial fibrillation on the monitor but by the time she had an EKG done she was in normal sinus rhythm. I think it's very important to distinguish whether she has atrial fibrillation or other forms of supraventricular tachycardia that did not require anticoagulation. It's very difficult to distinguish the sometimes from looking at the monitor. Other possibilities include atrial tachycardia or sinus tachycardia with frequent PACs. The patient is currently in normal sinus rhythm. I recommend a 30 day outpatient telemetry to clarify this issue. If she is found to have atrial fibrillation, long-term oral anticoagulation is recommended to decrease her future risk of thromboembolic complications. I will not put her on any rate controlling medication at this point in order not to affect the results of the monitor. I would also obtain an echocardiogram to evaluate for any underlying structural abnormalities.

## 2012-11-26 NOTE — Progress Notes (Signed)
HPI  This is a pleasant 78 year-old female who is referred by Dr. Glori Bickers for evaluation of possible atrial fibrillation. The patient is not aware of any previous cardiac history or arrhythmia. She went to the emergency room on December 24 with palpitations, feeling tired and strange. She will up at 1:00 in the morning with this feeling. She felt that her heart was going fast and irregular. She did not have any chest pain or significant dyspnea. She went to the emergency room at [facility]. She was told that her heart rate was irregular on the monitor but by the time a 12-lead ECG was performed, she was in normal sinus rhythm. She had no recurrent symptoms since then. Her labs were overall unremarkable including negative cardiac enzymes. Thyroid function was normal. Electrolytes were within normal limits. Chest x-ray showed no acute abnormalities. She had some swelling in her leg and thus left lower extremity venous duplex was performed and showed no evidence of DVT. There is family history of atrial fibrillation as both Mother and brother had afib in the past.      Allergies  Allergen Reactions  . Amlodipine Besylate     REACTION: increases BP  . Atorvastatin     REACTION: myalgia; shaking muscles  . Cefuroxime Axetil     REACTION: reaction not known  . Ciprofloxacin     REACTION: ? reaction  . Crestor (Rosuvastatin Calcium)     Severe myalgias   . Dextromethorphan-Guaifenesin     REACTION: jittery  . Epinephrine     REACTION: "put me in the hospital"; hives all over body  . Ramipril     REACTION: ? reaction  . Sulfamethoxazole W-Trimethoprim     REACTION: ? reaction     Current Outpatient Prescriptions on File Prior to Visit  Medication Sig Dispense Refill  . Calcium Carbonate-Vitamin D (CALCIUM 600 + D PO) 1 tablet daily.       . cholecalciferol (VITAMIN D) 1000 UNITS tablet Take 1,000 Units by mouth daily.        . clorazepate (TRANXENE) 7.5 MG tablet Take 1 tablet (7.5 mg total)  by mouth daily as needed for anxiety.  30 tablet  0  . Coenzyme Q10 200 MG capsule Take 200 mg by mouth daily.        . cyanocobalamin (,VITAMIN B-12,) 1000 MCG/ML injection Inject 1,000 mcg into the muscle. every six weeks       . irbesartan (AVAPRO) 150 MG tablet Take 1 tablet (150 mg total) by mouth daily.  90 tablet  1  . omeprazole (PRILOSEC) 20 MG capsule as needed.       . rosuvastatin (CRESTOR) 5 MG tablet Take 1 tablet on Monday, Wednesday, and Friday.  40 tablet  3     Past Medical History  Diagnosis Date  . Cancer     Skin, basal cell on nose and eyelid  . Hyperlipidemia   . Arthritis     Knees  . DJD (degenerative joint disease)     Low back  . Dyspepsia   . B12 deficiency     Mild  . Lactose intolerance   . Mixed incontinence   . Vulvar irritation     from urine pad, uses Triamcinolone oint.  . Hypertension      Past Surgical History  Procedure Date  . Abdominal hysterectomy 1982    Large fibroids and bleeding  . Bilateral salpingoophorectomy 1986  . Bone graft hip iliac crest  To Left arm  . Appendectomy   . Breast reduction surgery   . Hemorrhoid surgery 2006  . Esophagogastroduodenoscopy     Polyps  . Skin cancer eyelid 2012     Family History  Problem Relation Age of Onset  . Cancer Mother     Colon  . Heart disease Father     CAD  . Diabetes Sister   . Diabetes Brother   . Leukemia Brother   . Cancer Paternal Aunt     Breast  . Diabetes Sister      History   Social History  . Marital Status: Married    Spouse Name: N/A    Number of Children: 2  . Years of Education: N/A   Occupational History  . Proofreader    Social History Main Topics  . Smoking status: Former Smoker -- 1.0 packs/day for 12 years    Types: Cigarettes    Quit date: 11/24/1963  . Smokeless tobacco: Never Used  . Alcohol Use: No     Comment: Rare  . Drug Use: No  . Sexually Active: Not on file   Other Topics Concern  . Not on file   Social  History Narrative  . No narrative on file     ROS Constitutional: Negative for fever, chills, diaphoresis, activity change, appetite change and fatigue.  HENT: Negative for hearing loss, nosebleeds, congestion, sore throat, facial swelling, drooling, trouble swallowing, neck pain, voice change, sinus pressure and tinnitus.  Eyes: Negative for photophobia, pain, discharge and visual disturbance.  Respiratory: Negative for apnea, cough, chest tightness, shortness of breath and wheezing.  Cardiovascular: Negative for chest pain and leg swelling.  Gastrointestinal: Negative for nausea, vomiting, abdominal pain, diarrhea, constipation, blood in stool and abdominal distention.  Genitourinary: Negative for dysuria, urgency, frequency, hematuria and decreased urine volume.  Musculoskeletal: Negative for myalgias, back pain, joint swelling, arthralgias and gait problem.  Skin: Negative for color change, pallor, rash and wound.  Neurological: Negative for dizziness, tremors, seizures, syncope, speech difficulty, weakness, light-headedness, numbness and headaches.  Psychiatric/Behavioral: Negative for suicidal ideas, hallucinations, behavioral problems and agitation. The patient is not nervous/anxious.     PHYSICAL EXAM   BP 160/80  Pulse 77  Ht 5' 5"  (1.651 m)  Wt 209 lb 8 oz (95.029 kg)  BMI 34.86 kg/m2 Constitutional: She is oriented to person, place, and time. She appears well-developed and well-nourished. No distress.  HENT: No nasal discharge.  Head: Normocephalic and atraumatic.  Eyes: Pupils are equal and round. Right eye exhibits no discharge. Left eye exhibits no discharge.  Neck: Normal range of motion. Neck supple. No JVD present. No thyromegaly present.  Cardiovascular: Normal rate, regular rhythm, normal heart sounds. Exam reveals no gallop and no friction rub. No murmur heard.  Pulmonary/Chest: Effort normal and breath sounds normal. No stridor. No respiratory distress. She has  no wheezes. She has no rales. She exhibits no tenderness.  Abdominal: Soft. Bowel sounds are normal. She exhibits no distension. There is no tenderness. There is no rebound and no guarding.  Musculoskeletal: Normal range of motion. She exhibits no edema and no tenderness.  Neurological: She is alert and oriented to person, place, and time. Coordination normal.  Skin: Skin is warm and dry. No rash noted. She is not diaphoretic. No erythema. No pallor.  Psychiatric: She has a normal mood and affect. Her behavior is normal. Judgment and thought content normal.     EKG: Normal sinus rhythm Poor R-wave progression  ASSESSMENT AND PLAN

## 2012-11-29 ENCOUNTER — Ambulatory Visit (INDEPENDENT_AMBULATORY_CARE_PROVIDER_SITE_OTHER): Payer: Medicare Other | Admitting: Family Medicine

## 2012-11-29 ENCOUNTER — Encounter: Payer: Self-pay | Admitting: Family Medicine

## 2012-11-29 VITALS — BP 128/72 | HR 74 | Temp 97.6°F | Ht 65.0 in | Wt 209.8 lb

## 2012-11-29 DIAGNOSIS — Z01818 Encounter for other preprocedural examination: Secondary | ICD-10-CM | POA: Insufficient documentation

## 2012-11-29 NOTE — Progress Notes (Signed)
Subjective:    Patient ID: Tammy Manning, female    DOB: 1935/07/13, 78 y.o.   MRN: [id number]  HPI Here for pre operative exam for orthopedic surg from Dr Mardelle Matte  Found a large tear in meniscus in L knee and a cyst under the patella  Is to have arthroscopic surgery  Has not set a date yet   Recently began work up for palpitations- ? If afib or SVT / freq PVC Seeing Dr Fletcher Anon Plan from cardiol echo and also event monitor  (echo is planned on 21st and also cardiac event monitor for a month) Currently on asa-no other anticoag  Lots of stressors lately- this could have caused her symptoms  No episodes since she had monitor placed   Has had surgeries in the past  No major complications  She does not tolerate very strong pain medicines - demerol makes her vomit  She only wants tylenol - and nothing else   She does well with anethesia No n/v from anesthesia in the past     bp is stable today  No cp or palpitations or headaches or edema  No side effects to medicines  BP Readings from Last 3 Encounters:  11/29/12 128/72  11/21/12 160/80  11/17/12 130/64     Wt is stable with bmi of 34  She is into to some med incl some abx and epinephrine Former smoker with no hx of copd  No problems with breathing whatsoever  Pulse ox is 96%   No cardiac dz known   Lab Results  Component Value Date   WBC 5.2 09/29/2011   HGB 12.5 09/29/2011   HCT 37.2 09/29/2011   MCV 89.2 09/29/2011   PLT 281.0 09/29/2011      Chemistry      Component Value Date/Time   NA 141 09/12/2012 1014   K 4.3 09/12/2012 1014   CL 107 09/12/2012 1014   CO2 27 09/12/2012 1014   BUN 17 09/12/2012 1014   CREATININE 0.8 09/12/2012 1014      Component Value Date/Time   CALCIUM 9.2 09/12/2012 1014   ALKPHOS 51 09/12/2012 1014   AST 17 09/12/2012 1014   ALT 14 09/12/2012 1014   BILITOT 0.3 09/12/2012 1014         Patient Active Problem List  Diagnosis  . B12 DEFICIENCY  . HYPERLIPIDEMIA  . STRESS  REACTION, ACUTE, WITH EMOTIONAL DISTURBANCE  . HYPERTENSION, ESSENTIAL NOS  . DYSPEPSIA  . CONSTIPATION  . IBS  . BREAST MASS, LEFT  . POSTMENOPAUSAL STATUS  . DERMATITIS, ATOPIC  . LEG PAIN, RIGHT  . HEADACHE, CHRONIC  . SKIN CANCER, HX OF  . Adverse drug reaction  . Hearing loss of both ears  . Special screening for malignant neoplasms, colon  . Heme positive stool  . Left leg pain  . Left knee pain  . Episodic atrial fibrillation  . Palpitations  . Pre-operative examination   Past Medical History  Diagnosis Date  . Cancer     Skin, basal cell on nose and eyelid  . Hyperlipidemia   . Arthritis     Knees  . DJD (degenerative joint disease)     Low back  . Dyspepsia   . B12 deficiency     Mild  . Lactose intolerance   . Mixed incontinence   . Vulvar irritation     from urine pad, uses Triamcinolone oint.  . Hypertension    Past Surgical History  Procedure Date  . Abdominal  hysterectomy 1982    Large fibroids and bleeding  . Bilateral salpingoophorectomy 1986  . Bone graft hip iliac crest     To Left arm  . Appendectomy   . Breast reduction surgery   . Hemorrhoid surgery 2006  . Esophagogastroduodenoscopy     Polyps  . Skin cancer eyelid 2012   History  Substance Use Topics  . Smoking status: Former Smoker -- 1.0 packs/day for 12 years    Types: Cigarettes    Quit date: 11/24/1963  . Smokeless tobacco: Never Used  . Alcohol Use: No     Comment: Rare   Family History  Problem Relation Age of Onset  . Cancer Mother     Colon  . Heart disease Father     CAD  . Diabetes Sister   . Diabetes Brother   . Leukemia Brother   . Cancer Paternal Aunt     Breast  . Diabetes Sister    Allergies  Allergen Reactions  . Amlodipine Besylate     REACTION: increases BP  . Atorvastatin     REACTION: myalgia; shaking muscles  . Cefuroxime Axetil     REACTION: reaction not known  . Ciprofloxacin     REACTION: ? reaction  . Crestor (Rosuvastatin Calcium)       Severe myalgias   . Dextromethorphan-Guaifenesin     REACTION: jittery  . Epinephrine     REACTION: "put me in the hospital"; hives all over body  . Ramipril     REACTION: ? reaction  . Sulfamethoxazole W-Trimethoprim     REACTION: ? reaction   Current Outpatient Prescriptions on File Prior to Visit  Medication Sig Dispense Refill  . aspirin 81 MG tablet Takes 2 tablets daily.      . Calcium Carbonate-Vitamin D (CALCIUM 600 + D PO) 1 tablet daily.       . cholecalciferol (VITAMIN D) 1000 UNITS tablet Take 1,000 Units by mouth daily.        . clorazepate (TRANXENE) 7.5 MG tablet Take 1 tablet (7.5 mg total) by mouth daily as needed for anxiety.  30 tablet  0  . Coenzyme Q10 200 MG capsule Take 200 mg by mouth daily.        . cyanocobalamin (,VITAMIN B-12,) 1000 MCG/ML injection Inject 1,000 mcg into the muscle. every six weeks       . irbesartan (AVAPRO) 150 MG tablet Take 1 tablet (150 mg total) by mouth daily.  90 tablet  1  . NON FORMULARY Ginkabro daily.      [name] Kitchen omeprazole (PRILOSEC) 20 MG capsule as needed.       . rosuvastatin (CRESTOR) 5 MG tablet Take 1 tablet on Monday, Wednesday, and Friday.  40 tablet  3    Review of Systems Review of Systems  Constitutional: Negative for fever, appetite change, fatigue and unexpected weight change.  Eyes: Negative for pain and visual disturbance.  Respiratory: Negative for cough and shortness of breath.   Cardiovascular: Negative for cp or palpitations    Gastrointestinal: Negative for nausea, diarrhea and constipation.  Genitourinary: Negative for urgency and frequency.  Skin: Negative for pallor or rash   Neurological: Negative for weakness, light-headedness, numbness and headaches.  Hematological: Negative for adenopathy. Does not bruise/bleed easily.  Psychiatric/Behavioral: Negative for dysphoric mood. The patient is not nervous/anxious.         Objective:   Physical Exam  Constitutional: She appears well-developed and  well-nourished. No distress.  obese and well appearing   HENT:  Head: Normocephalic and atraumatic.  Mouth/Throat: Oropharynx is clear and moist.  Eyes: Conjunctivae normal and EOM are normal. Pupils are equal, round, and reactive to light. No scleral icterus.  Neck: Normal range of motion. Neck supple. No JVD present. Carotid bruit is not present. No tracheal deviation present. No thyromegaly present.  Cardiovascular: Normal rate, regular rhythm, normal heart sounds and intact distal pulses.  Exam reveals no gallop.   Pulmonary/Chest: Effort normal and breath sounds normal. No respiratory distress. She has no wheezes.  Abdominal: Soft. Bowel sounds are normal. She exhibits no distension, no abdominal bruit and no mass. There is no tenderness.  Musculoskeletal: She exhibits no edema and no tenderness.  Lymphadenopathy:    She has no cervical adenopathy.  Neurological: She is alert. She has normal reflexes. No cranial nerve deficit. She exhibits normal muscle tone. Coordination normal.  Skin: Skin is warm and dry. No rash noted. No erythema. No pallor.  Psychiatric: She has a normal mood and affect.          Assessment & Plan:

## 2012-11-29 NOTE — Assessment & Plan Note (Signed)
Pt is medically clear for orthopedic surgery - but still pending cardiac clearance (in midst of work up for palpitations - with echo and event monitor) If afib is found and pt needs to be anticoagulated this could complicate things Otherwise pt is low risk / with no prev prob with surgeries or anesthesia She does however strongly desire no narcotics to be used for pain / wants tylenol only

## 2012-11-29 NOTE — Patient Instructions (Addendum)
Go ahead and finish your cardiology work up  You are medically clear for surgery at this time - but still need to be cleared by cardiology I will send papers to your orthopedic doctor

## 2012-12-08 ENCOUNTER — Telehealth: Payer: Self-pay

## 2012-12-08 MED ORDER — ATORVASTATIN CALCIUM 10 MG PO TABS: ORAL_TABLET | ORAL | Status: DC

## 2012-12-08 NOTE — Telephone Encounter (Signed)
Done and I sent electronically Let me know if any problems

## 2012-12-08 NOTE — Telephone Encounter (Signed)
Pt usually takes Crestor 5 mg on Mon, Wed and Fri. Pt is almost out of med and request med changed to generic med like atorvastatin and take that 3 times a week due to cost of med. Tammy Manning. Pt also request date of last B 12 injection and advised 11/11/12.

## 2012-12-09 NOTE — Telephone Encounter (Signed)
Pt.notified

## 2012-12-13 ENCOUNTER — Other Ambulatory Visit (INDEPENDENT_AMBULATORY_CARE_PROVIDER_SITE_OTHER): Payer: Medicare Other

## 2012-12-13 ENCOUNTER — Other Ambulatory Visit: Payer: Self-pay

## 2012-12-13 DIAGNOSIS — I359 Nonrheumatic aortic valve disorder, unspecified: Secondary | ICD-10-CM | POA: Diagnosis not present

## 2012-12-13 DIAGNOSIS — I059 Rheumatic mitral valve disease, unspecified: Secondary | ICD-10-CM

## 2012-12-13 DIAGNOSIS — I4891 Unspecified atrial fibrillation: Secondary | ICD-10-CM

## 2012-12-15 ENCOUNTER — Telehealth: Payer: Self-pay

## 2012-12-15 NOTE — Telephone Encounter (Signed)
She is at low risk from a cardiac standpoint.

## 2012-12-15 NOTE — Telephone Encounter (Signed)
Pt needs cardiac clearance for ortho surg with [facility]  Is she clearerd?

## 2012-12-15 NOTE — Telephone Encounter (Signed)
Letter faxed.

## 2012-12-26 ENCOUNTER — Ambulatory Visit (INDEPENDENT_AMBULATORY_CARE_PROVIDER_SITE_OTHER): Payer: Medicare Other | Admitting: Cardiovascular Disease

## 2012-12-26 ENCOUNTER — Encounter: Payer: Self-pay | Admitting: Cardiovascular Disease

## 2012-12-26 VITALS — BP 148/78 | HR 74 | Ht 65.0 in | Wt 209.0 lb

## 2012-12-26 DIAGNOSIS — Z01818 Encounter for other preprocedural examination: Secondary | ICD-10-CM | POA: Diagnosis not present

## 2012-12-26 DIAGNOSIS — R002 Palpitations: Secondary | ICD-10-CM

## 2012-12-26 NOTE — Progress Notes (Signed)
HPI  This is a pleasant 78 year-old female who is here today for followup visit regarding palpitations. She has no previous cardiac history or arrhythmia. She went to the emergency room on December 24 with palpitations, feeling tired and strange. She woke up at 1:00 in the morning with this feeling. She felt that her heart was going fast and irregular. She did not have any chest pain or significant dyspnea. She went to the emergency room at [facility] She was told that her heart rate was irregular on the monitor but by the time a 12-lead ECG was performed, she was in normal sinus rhythm. She had no recurrent symptoms since then. Her labs were overall unremarkable including negative cardiac enzymes. Thyroid function was normal. Electrolytes were within normal limits. Chest x-ray showed no acute abnormalities. She had some swelling in her leg and thus left lower extremity venous duplex was performed and showed no evidence of DVT.  She underwent evaluation with a 30 day outpatient telemetry which showed no evidence of atrial fibrillation or other significant arrhythmia other than few premature beats. Echocardiogram showed normal LV systolic function with only mild mitral and aortic regurgitation without evidence of pulmonary hypertension.   Allergies  Allergen Reactions  . Amlodipine Besylate     REACTION: increases BP  . Atorvastatin     REACTION: myalgia; shaking muscles  . Cefuroxime Axetil     REACTION: reaction not known  . Ciprofloxacin     REACTION: ? reaction  . Crestor (Rosuvastatin Calcium)     Severe myalgias   . Dextromethorphan-Guaifenesin     REACTION: jittery  . Epinephrine     REACTION: "put me in the hospital"; hives all over body  . Ramipril     REACTION: ? reaction  . Sulfamethoxazole W-Trimethoprim     REACTION: ? reaction     Current Outpatient Prescriptions on File Prior to Visit  Medication Sig Dispense Refill  . aspirin 81 MG tablet Takes 2 tablets daily.      [name] Kitchen  atorvastatin (LIPITOR) 10 MG tablet Take one pill by mouth on mon, wed and fri  12 tablet  11  . Calcium Carbonate-Vitamin D (CALCIUM 600 + D PO) 1 tablet daily.       . cholecalciferol (VITAMIN D) 1000 UNITS tablet Take 1,000 Units by mouth daily.        . clorazepate (TRANXENE) 7.5 MG tablet Take 1 tablet (7.5 mg total) by mouth daily as needed for anxiety.  30 tablet  0  . Coenzyme Q10 200 MG capsule Take 200 mg by mouth daily.        . cyanocobalamin (,VITAMIN B-12,) 1000 MCG/ML injection Inject 1,000 mcg into the muscle. every six weeks       . irbesartan (AVAPRO) 150 MG tablet Take 1 tablet (150 mg total) by mouth daily.  90 tablet  1  . NON FORMULARY Ginkabro daily.      [name] Kitchen omeprazole (PRILOSEC) 20 MG capsule as needed.          Past Medical History  Diagnosis Date  . Cancer     Skin, basal cell on nose and eyelid  . Hyperlipidemia   . Arthritis     Knees  . DJD (degenerative joint disease)     Low back  . Dyspepsia   . B12 deficiency     Mild  . Lactose intolerance   . Mixed incontinence   . Vulvar irritation     from urine pad,  uses Triamcinolone oint.  . Hypertension      Past Surgical History  Procedure Date  . Abdominal hysterectomy 1982    Large fibroids and bleeding  . Bilateral salpingoophorectomy 1986  . Bone graft hip iliac crest     To Left arm  . Appendectomy   . Breast reduction surgery   . Hemorrhoid surgery 2006  . Esophagogastroduodenoscopy     Polyps  . Skin cancer eyelid 2012     Family History  Problem Relation Age of Onset  . Cancer Mother     Colon  . Heart disease Father     CAD  . Diabetes Sister   . Diabetes Brother   . Leukemia Brother   . Cancer Paternal Aunt     Breast  . Diabetes Sister      History   Social History  . Marital Status: Married    Spouse Name: N/A    Number of Children: 2  . Years of Education: N/A   Occupational History  . Proofreader    Social History Main Topics  . Smoking status:  Former Smoker -- 1.0 packs/day for 12 years    Types: Cigarettes    Quit date: 11/24/1963  . Smokeless tobacco: Never Used  . Alcohol Use: No     Comment: Rare  . Drug Use: No  . Sexually Active: Not on file   Other Topics Concern  . Not on file   Social History Narrative  . No narrative on file        PHYSICAL EXAM   BP 148/78  Pulse 74  Ht 5' 5"  (1.651 m)  Wt 209 lb (94.802 kg)  BMI 34.78 kg/m2 Constitutional: She is oriented to person, place, and time. She appears well-developed and well-nourished. No distress.  HENT: No nasal discharge.  Head: Normocephalic and atraumatic.  Eyes: Pupils are equal and round. Right eye exhibits no discharge. Left eye exhibits no discharge.  Neck: Normal range of motion. Neck supple. No JVD present. No thyromegaly present.  Cardiovascular: Normal rate, regular rhythm, normal heart sounds. Exam reveals no gallop and no friction rub. No murmur heard.  Pulmonary/Chest: Effort normal and breath sounds normal. No stridor. No respiratory distress. She has no wheezes. She has no rales. She exhibits no tenderness.  Abdominal: Soft. Bowel sounds are normal. She exhibits no distension. There is no tenderness. There is no rebound and no guarding.  Musculoskeletal: Normal range of motion. She exhibits no edema and no tenderness.  Neurological: She is alert and oriented to person, place, and time. Coordination normal.  Skin: Skin is warm and dry. No rash noted. She is not diaphoretic. No erythema. No pallor.  Psychiatric: She has a normal mood and affect. Her behavior is normal. Judgment and thought content normal.      ASSESSMENT AND PLAN

## 2012-12-26 NOTE — Assessment & Plan Note (Signed)
The patient has no symptoms suggestive of angina. Recent outpatient telemetry showed no evidence of arrhythmia. Echocardiogram showed normal LV systolic function without significant valvular abnormalities. Thus, she is at an overall low risk for cardiovascular complications. No further cardiac workup is indicated at this time.

## 2012-12-26 NOTE — Assessment & Plan Note (Signed)
30 day outpatient telemetry showed no evidence of atrial fibrillation or other significant arrhythmia. She is not having any recurrent symptoms. Continue observation for now. I asked her to followup with me if she has recurrent symptoms.

## 2012-12-26 NOTE — Patient Instructions (Addendum)
You are clear for knee surgery.  Follow up as needed.

## 2012-12-28 ENCOUNTER — Encounter (HOSPITAL_BASED_OUTPATIENT_CLINIC_OR_DEPARTMENT_OTHER): Payer: Self-pay | Admitting: *Deleted

## 2012-12-28 NOTE — Progress Notes (Signed)
Pt had palpatations at [facility] cardiology for holter monitor and echo-cleared for surgery-only had this one time

## 2012-12-30 ENCOUNTER — Encounter (HOSPITAL_BASED_OUTPATIENT_CLINIC_OR_DEPARTMENT_OTHER): Payer: Self-pay | Admitting: Certified Registered Nurse Anesthetist

## 2012-12-30 ENCOUNTER — Ambulatory Visit (HOSPITAL_BASED_OUTPATIENT_CLINIC_OR_DEPARTMENT_OTHER): Payer: Medicare Other | Admitting: Certified Registered Nurse Anesthetist

## 2012-12-30 ENCOUNTER — Ambulatory Visit (HOSPITAL_BASED_OUTPATIENT_CLINIC_OR_DEPARTMENT_OTHER)
Admission: RE | Admit: 2012-12-30 | Discharge: 2012-12-30 | Disposition: A | Discharge status: Home / Self Care | Payer: Medicare Other | Source: Ambulatory Visit | Attending: Orthopedic Surgery | Admitting: Orthopedic Surgery

## 2012-12-30 ENCOUNTER — Encounter (HOSPITAL_BASED_OUTPATIENT_CLINIC_OR_DEPARTMENT_OTHER): Payer: Self-pay | Admitting: *Deleted

## 2012-12-30 ENCOUNTER — Encounter (HOSPITAL_BASED_OUTPATIENT_CLINIC_OR_DEPARTMENT_OTHER)
Admission: RE | Disposition: A | Discharge status: Home / Self Care | Payer: Self-pay | Source: Ambulatory Visit | Attending: Orthopedic Surgery

## 2012-12-30 ENCOUNTER — Encounter (HOSPITAL_BASED_OUTPATIENT_CLINIC_OR_DEPARTMENT_OTHER): Payer: Self-pay | Admitting: Orthopedic Surgery

## 2012-12-30 DIAGNOSIS — I10 Essential (primary) hypertension: Secondary | ICD-10-CM | POA: Diagnosis not present

## 2012-12-30 DIAGNOSIS — S83252A Bucket-handle tear of lateral meniscus, current injury, left knee, initial encounter: Secondary | ICD-10-CM

## 2012-12-30 DIAGNOSIS — M23305 Other meniscus derangements, unspecified medial meniscus, unspecified knee: Secondary | ICD-10-CM | POA: Insufficient documentation

## 2012-12-30 DIAGNOSIS — M23349 Other meniscus derangements, anterior horn of lateral meniscus, unspecified knee: Secondary | ICD-10-CM | POA: Insufficient documentation

## 2012-12-30 DIAGNOSIS — S83289A Other tear of lateral meniscus, current injury, unspecified knee, initial encounter: Secondary | ICD-10-CM | POA: Diagnosis not present

## 2012-12-30 HISTORY — DX: Other specified postprocedural states: Z98.890

## 2012-12-30 HISTORY — DX: Nausea with vomiting, unspecified: R11.2

## 2012-12-30 HISTORY — DX: Bucket-handle tear of lateral meniscus, current injury, left knee, initial encounter: S83.252A

## 2012-12-30 HISTORY — DX: Gastro-esophageal reflux disease without esophagitis: K21.9

## 2012-12-30 HISTORY — PX: KNEE ARTHROSCOPY WITH LATERAL MENISECTOMY: SHX6193

## 2012-12-30 LAB — POCT HEMOGLOBIN-HEMACUE: Hemoglobin: 14.7 g/dL (ref 12.0–15.0)

## 2012-12-30 LAB — POCT I-STAT, CHEM 8
BUN: 17 mg/dL (ref 6–23)
Calcium, Ion: 1.2 mmol/L (ref 1.13–1.30)
Chloride: 109 mEq/L (ref 96–112)
Creatinine, Ser: 1 mg/dL (ref 0.50–1.10)
Glucose, Bld: 87 mg/dL (ref 70–99)
HCT: 38 % (ref 36.0–46.0)
Hemoglobin: 12.9 g/dL (ref 12.0–15.0)
Potassium: 3.9 mEq/L (ref 3.5–5.1)
Sodium: 141 mEq/L (ref 135–145)
TCO2: 26 mmol/L (ref 0–100)

## 2012-12-30 SURGERY — ARTHROSCOPY, KNEE, WITH LATERAL MENISCECTOMY
Anesthesia: General | Site: Knee | Laterality: Left

## 2012-12-30 MED ORDER — LACTATED RINGERS IV SOLN
INTRAVENOUS | Status: DC
  Administered 2012-12-30: 07:00:00 via INTRAVENOUS

## 2012-12-30 MED ORDER — PROMETHAZINE HCL 25 MG/ML IJ SOLN: 6.2500 mg | INTRAMUSCULAR | Status: DC | PRN

## 2012-12-30 MED ORDER — FENTANYL CITRATE 0.05 MG/ML IJ SOLN: 50.0000 ug | INTRAMUSCULAR | Status: DC | PRN

## 2012-12-30 MED ORDER — HYDROCODONE-ACETAMINOPHEN 10-325 MG PO TABS: 1.0000 | ORAL_TABLET | Freq: Four times a day (QID) | ORAL | Status: DC | PRN

## 2012-12-30 MED ORDER — MEPERIDINE HCL 25 MG/ML IJ SOLN: 6.2500 mg | INTRAMUSCULAR | Status: DC | PRN

## 2012-12-30 MED ORDER — HYDROMORPHONE HCL PF 1 MG/ML IJ SOLN
0.2500 mg | INTRAMUSCULAR | Status: DC | PRN
  Administered 2012-12-30: 0.25 mg via INTRAVENOUS

## 2012-12-30 MED ORDER — SODIUM CHLORIDE 0.9 % IR SOLN
Status: DC | PRN
  Administered 2012-12-30: 6000 mL

## 2012-12-30 MED ORDER — PROPOFOL 10 MG/ML IV BOLUS
INTRAVENOUS | Status: DC | PRN
  Administered 2012-12-30: 150 mg via INTRAVENOUS

## 2012-12-30 MED ORDER — BUPIVACAINE HCL (PF) 0.5 % IJ SOLN
INTRAMUSCULAR | Status: DC | PRN
  Administered 2012-12-30: 20 mL

## 2012-12-30 MED ORDER — OXYCODONE HCL 5 MG/5ML PO SOLN: 5.0000 mg | Freq: Once | ORAL | Status: DC | PRN

## 2012-12-30 MED ORDER — DEXAMETHASONE SODIUM PHOSPHATE 10 MG/ML IJ SOLN
INTRAMUSCULAR | Status: DC | PRN
  Administered 2012-12-30: 10 mg via INTRAVENOUS

## 2012-12-30 MED ORDER — LIDOCAINE HCL (CARDIAC) 20 MG/ML IV SOLN
INTRAVENOUS | Status: DC | PRN
  Administered 2012-12-30: 60 mg via INTRAVENOUS

## 2012-12-30 MED ORDER — MIDAZOLAM HCL 2 MG/2ML IJ SOLN: 1.0000 mg | INTRAMUSCULAR | Status: DC | PRN

## 2012-12-30 MED ORDER — ONDANSETRON HCL 4 MG/2ML IJ SOLN
INTRAMUSCULAR | Status: DC | PRN
  Administered 2012-12-30: 4 mg via INTRAVENOUS

## 2012-12-30 MED ORDER — FENTANYL CITRATE 0.05 MG/ML IJ SOLN
INTRAMUSCULAR | Status: DC | PRN
  Administered 2012-12-30 (×2): 50 ug via INTRAVENOUS

## 2012-12-30 MED ORDER — CEFAZOLIN SODIUM-DEXTROSE 2-3 GM-% IV SOLR
2.0000 g | INTRAVENOUS | Status: AC
  Administered 2012-12-30: 2 g via INTRAVENOUS

## 2012-12-30 MED ORDER — SENNA-DOCUSATE SODIUM 8.6-50 MG PO TABS: 1.0000 | ORAL_TABLET | Freq: Every day | ORAL | Status: DC

## 2012-12-30 MED ORDER — OXYCODONE HCL 5 MG PO TABS: 5.0000 mg | ORAL_TABLET | Freq: Once | ORAL | Status: DC | PRN

## 2012-12-30 SURGICAL SUPPLY — 45 items
APL SKNCLS STERI-STRIP NONHPOA (GAUZE/BANDAGES/DRESSINGS) ×1
BANDAGE ELASTIC 6 VELCRO ST LF (GAUZE/BANDAGES/DRESSINGS) ×2 IMPLANT
BANDAGE ESMARK 6X9 LF (GAUZE/BANDAGES/DRESSINGS) IMPLANT
BENZOIN TINCTURE PRP APPL 2/3 (GAUZE/BANDAGES/DRESSINGS) ×2 IMPLANT
BLADE CUTTER GATOR 3.5 (BLADE) ×2 IMPLANT
BNDG CMPR 9X6 STRL LF SNTH (GAUZE/BANDAGES/DRESSINGS)
BNDG ESMARK 6X9 LF (GAUZE/BANDAGES/DRESSINGS)
CANISTER OMNI JUG 16 LITER (MISCELLANEOUS) ×2 IMPLANT
CANISTER SUCTION 2500CC (MISCELLANEOUS) IMPLANT
CLOTH BEACON ORANGE TIMEOUT ST (SAFETY) ×2 IMPLANT
CUFF TOURNIQUET SINGLE 34IN LL (TOURNIQUET CUFF) IMPLANT
CUTTER KNOT PUSHER 2-0 FIBERWI (INSTRUMENTS) IMPLANT
CUTTER MENISCUS  4.2MM (BLADE)
CUTTER MENISCUS 4.2MM (BLADE) IMPLANT
DRAPE ARTHROSCOPY W/POUCH 90 (DRAPES) ×2 IMPLANT
DURAPREP 26ML APPLICATOR (WOUND CARE) ×2 IMPLANT
ELECT MENISCUS 165MM 90D (ELECTRODE) IMPLANT
ELECT REM PT RETURN 9FT ADLT (ELECTROSURGICAL)
ELECTRODE REM PT RTRN 9FT ADLT (ELECTROSURGICAL) IMPLANT
GLOVE BIO SURGEON STRL SZ 6.5 (GLOVE) ×1 IMPLANT
GLOVE BIO SURGEON STRL SZ8 (GLOVE) ×2 IMPLANT
GLOVE BIOGEL PI IND STRL 7.0 (GLOVE) IMPLANT
GLOVE BIOGEL PI IND STRL 8 (GLOVE) ×2 IMPLANT
GLOVE BIOGEL PI INDICATOR 7.0 (GLOVE) ×2
GLOVE BIOGEL PI INDICATOR 8 (GLOVE) ×2
GLOVE ECLIPSE 6.5 STRL STRAW (GLOVE) ×1 IMPLANT
GLOVE ORTHO TXT STRL SZ7.5 (GLOVE) ×2 IMPLANT
GOWN BRE IMP PREV XXLGXLNG (GOWN DISPOSABLE) ×4 IMPLANT
GOWN PREVENTION PLUS XLARGE (GOWN DISPOSABLE) ×2 IMPLANT
HOLDER KNEE FOAM BLUE (MISCELLANEOUS) ×2 IMPLANT
IV NS IRRIG 3000ML ARTHROMATIC (IV SOLUTION) ×4 IMPLANT
KNEE WRAP E Z 3 GEL PACK (MISCELLANEOUS) ×2 IMPLANT
PACK ARTHROSCOPY DSU (CUSTOM PROCEDURE TRAY) ×2 IMPLANT
PACK BASIN DAY SURGERY FS (CUSTOM PROCEDURE TRAY) ×2 IMPLANT
PENCIL BUTTON HOLSTER BLD 10FT (ELECTRODE) IMPLANT
SET ARTHROSCOPY TUBING (MISCELLANEOUS) ×2
SET ARTHROSCOPY TUBING LN (MISCELLANEOUS) ×1 IMPLANT
SLEEVE SCD COMPRESS KNEE MED (MISCELLANEOUS) IMPLANT
SPONGE GAUZE 4X4 12PLY (GAUZE/BANDAGES/DRESSINGS) ×2 IMPLANT
STRIP CLOSURE SKIN 1/2X4 (GAUZE/BANDAGES/DRESSINGS) ×2 IMPLANT
SUT MNCRL AB 4-0 PS2 18 (SUTURE) ×2 IMPLANT
TOWEL OR 17X24 6PK STRL BLUE (TOWEL DISPOSABLE) ×2 IMPLANT
TOWEL OR NON WOVEN STRL DISP B (DISPOSABLE) ×2 IMPLANT
WAND STAR VAC 90 (SURGICAL WAND) IMPLANT
WATER STERILE IRR 1000ML POUR (IV SOLUTION) ×2 IMPLANT

## 2012-12-30 NOTE — Anesthesia Postprocedure Evaluation (Signed)
  Anesthesia Post-op Note  Patient: Tammy Manning  Procedure(s) Performed: Procedure(s) (LRB) with comments: KNEE ARTHROSCOPY WITH LATERAL MENISECTOMY (Left) - LEFT KNEE SCOPE LATERAL MENISCECTOMY  Patient Location: PACU  Anesthesia Type:General  Level of Consciousness: awake  Airway and Oxygen Therapy: Patient Spontanous Breathing  Post-op Pain: mild  Post-op Assessment: Post-op Vital signs reviewed  Post-op Vital Signs: stable  Complications: No apparent anesthesia complications

## 2012-12-30 NOTE — Anesthesia Procedure Notes (Signed)
Procedure Name: LMA Insertion Date/Time: 12/30/2012 7:33 AM Performed by: Jayvin Hurrell D Pre-anesthesia Checklist: Patient identified, Emergency Drugs available, Suction available and Patient being monitored Patient Re-evaluated:Patient Re-evaluated prior to inductionOxygen Delivery Method: Circle System Utilized Preoxygenation: Pre-oxygenation with 100% oxygen Intubation Type: IV induction Ventilation: Mask ventilation without difficulty LMA: LMA inserted LMA Size: 4.0 Number of attempts: 1 Airway Equipment and Method: bite block Placement Confirmation: positive ETCO2 Tube secured with: Tape Dental Injury: Teeth and Oropharynx as per pre-operative assessment

## 2012-12-30 NOTE — H&P (Signed)
PREOPERATIVE H&P  Chief Complaint: LEFT KNEE TEAR MENISCUS LATERAL/ANTHORN/POSTHORN  HPI: Tammy Manning is a 78 y.o. female who presents for preoperative history and physical with a diagnosis of LEFT KNEE TEAR MENISCUS LATERAL/ANTHORN/POSTHORN. Symptoms are rated as moderate to severe, and have been worsening.  This is significantly impairing activities of daily living.  She has elected for surgical management. She has failed conservative measures, with time, activity modification, anti-inflammatories, although they irritated her reflux, and injections.  Past Medical History  Diagnosis Date  . Cancer     Skin, basal cell on nose and eyelid  . Hyperlipidemia   . Arthritis     Knees  . DJD (degenerative joint disease)     Low back  . Dyspepsia   . B12 deficiency     Mild  . Lactose intolerance   . Mixed incontinence   . Vulvar irritation     from urine pad, uses Triamcinolone oint.  . Hypertension   . GERD (gastroesophageal reflux disease)   . PONV (postoperative nausea and vomiting)    Past Surgical History  Procedure Date  . Abdominal hysterectomy 1982    Large fibroids and bleeding  . Bilateral salpingoophorectomy 1986  . Bone graft hip iliac crest     To Left arm  . Appendectomy   . Breast reduction surgery   . Hemorrhoid surgery 2006  . Esophagogastroduodenoscopy     Polyps  . Skin cancer eyelid 2012  . Tonsillectomy   . Dilation and curettage of uterus     miscarragesx2   History   Social History  . Marital Status: Married    Spouse Name: N/A    Number of Children: 2  . Years of Education: N/A   Occupational History  . Proofreader    Social History Main Topics  . Smoking status: Former Smoker -- 1.0 packs/day for 12 years    Types: Cigarettes    Quit date: 11/24/1963  . Smokeless tobacco: Never Used  . Alcohol Use: No     Comment: Rare  . Drug Use: No  . Sexually Active: None   Other Topics Concern  . None   Social History Narrative   . None   Family History  Problem Relation Age of Onset  . Cancer Mother     Colon  . Heart disease Father     CAD  . Diabetes Sister   . Diabetes Brother   . Leukemia Brother   . Cancer Paternal Aunt     Breast  . Diabetes Sister    Allergies  Allergen Reactions  . Amlodipine Besylate     REACTION: increases BP  . Atorvastatin     REACTION: myalgia; shaking muscles  . Cefuroxime Axetil     REACTION: reaction not known  . Ciprofloxacin     REACTION: ? reaction  . Crestor (Rosuvastatin Calcium)     Severe myalgias   . Dextromethorphan-Guaifenesin     REACTION: jittery  . Epinephrine     REACTION: "put me in the hospital"; hives all over body  . Ramipril     REACTION: ? reaction  . Sulfamethoxazole W-Trimethoprim     REACTION: ? reaction   Prior to Admission medications   Medication Sig Start Date End Date Taking? Authorizing Provider  aspirin 81 MG tablet Takes 2 tablets daily.   Yes Historical Provider, MD  Calcium Carbonate-Vitamin D (CALCIUM 600 + D PO) 1 tablet daily.    Yes Historical Provider, MD  cholecalciferol (VITAMIN D)  1000 UNITS tablet Take 1,000 Units by mouth daily.     Yes Historical Provider, MD  clorazepate (TRANXENE) 7.5 MG tablet Take 1 tablet (7.5 mg total) by mouth daily as needed for anxiety. 10/28/12  Yes Abner Greenspan, MD  Coenzyme Q10 200 MG capsule Take 200 mg by mouth daily.     Yes Historical Provider, MD  cyanocobalamin (,VITAMIN B-12,) 1000 MCG/ML injection Inject 1,000 mcg into the muscle. every six weeks    Yes Historical Provider, MD  irbesartan (AVAPRO) 150 MG tablet Take 1 tablet (150 mg total) by mouth daily. 11/09/12  Yes Abner Greenspan, MD  NON Jaquelyn Bitter daily.   Yes Historical Provider, MD  omeprazole (PRILOSEC) 20 MG capsule as needed.  10/28/12  Yes Abner Greenspan, MD  atorvastatin (LIPITOR) 10 MG tablet Take one pill by mouth on mon, wed and fri 12/08/12   Abner Greenspan, MD     Positive ROS: All other systems have been  reviewed and were otherwise negative with the exception of those mentioned in the HPI and as above.  Physical Exam: General: Alert, no acute distress Cardiovascular: No pedal edema Respiratory: No cyanosis, no use of accessory musculature GI: No organomegaly, abdomen is soft and non-tender Skin: No lesions in the area of chief complaint Neurologic: Sensation intact distally Psychiatric: Patient is competent for consent with normal mood and affect Lymphatic: No axillary or cervical lymphadenopathy  MUSCULOSKELETAL: Left knee positive lateral jointline tenderness. Mild effusion. Stable.  Assessment: LEFT KNEE TEAR MENISCUS LATERAL/ANTHORN/POSTHORN  Plan: Plan for Procedure(s): KNEE ARTHROSCOPY WITH LATERAL MENISECTOMY  The risks benefits and alternatives were discussed with the patient including but not limited to the risks of nonoperative treatment, versus surgical intervention including infection, bleeding, nerve injury,  blood clots, cardiopulmonary complications, morbidity, mortality, among others, and they were willing to proceed. We will also give Ancef, and I checked with her on her allergy, and she has never had an anaphylactic reaction, and all of the medications on her list either just made her "hyper" or decreased her level of energy. None of these were true allergies.  Hillman Attig P, MD Cell [phone] Pager [phone]  12/30/2012 7:25 AM

## 2012-12-30 NOTE — Op Note (Signed)
12/30/2012  8:23 AM  PATIENT:  Tammy Manning    PRE-OPERATIVE DIAGNOSIS:  LEFT KNEE TEAR MENISCUS LATERAL/ANTHORN/POSTHORN  POST-OPERATIVE DIAGNOSIS:  Same  PROCEDURE:  KNEE ARTHROSCOPY WITH LATERAL MENISECTOMY  SURGEON:  Johnny Bridge, MD  PHYSICIAN ASSISTANT: Joya Gaskins, OPA-C, present and scrubbed throughout the case, critical for completion in a timely fashion, and for retraction, instrumentation, and closure.  ANESTHESIA:   General  PREOPERATIVE INDICATIONS:  [name] is a  78 y.o. female with a diagnosis of LEFT KNEE TEAR MENISCUS LATERAL/ANTHORN/POSTHORN who failed conservative measures and elected for surgical management.    The risks benefits and alternatives were discussed with the patient preoperatively including but not limited to the risks of infection, bleeding, nerve injury, cardiopulmonary complications, the need for revision surgery, among others, and the patient was willing to proceed.  OPERATIVE IMPLANTS: None  OPERATIVE FINDINGS: There was some grade 2 changes underneath the patella, with some extensive grade 2 and grade 3 changes on the medial femoral condyle, with a very small central tear of the medial meniscus, the medial tibial plateau was in reasonably good condition. The anterior cruciate ligament was somewhat degenerative, and did not take on great tension, and in the back was slack. The lateral meniscus was torn anteriorly, as well as extending around to the mid body. This is a cleavage type tear with complex configuration. The posterior horn of lateral meniscus was intact.  OPERATIVE PROCEDURE: The patient is brought to the operating room and placed in the supine position. General anesthesia was administered. IV Ancef was given. The left lower extremity was prepped and draped in usual sterile fashion.   Time out was performed. Diagnostic arthroscopy was carried out the above-named findings. I used the arthroscopic shaver to gently debride the  central portion of the medial meniscus, but I didn't really need to remove very much meniscus tear. I'm or and just smoothed the edge. The lateral meniscus however had a complex tear, which I debrided extensively with a shaver. Once I get back to a stable configuration I confirmed adequate debridement by switching portals, and it was found to have no additional free edges. The arthroscopic instruments were removed, and the portals closed with Monocryl, the knee injected, sterile gauze was applied along with Steri-Strips. She was awakened and returned to the PACU in stable and satisfactory condition. There were no complications.

## 2012-12-30 NOTE — Transfer of Care (Signed)
Immediate Anesthesia Transfer of Care Note  Patient: Tammy Manning  Procedure(s) Performed: Procedure(s) (LRB) with comments: KNEE ARTHROSCOPY WITH LATERAL MENISECTOMY (Left) - LEFT KNEE SCOPE LATERAL MENISCECTOMY  Patient Location: PACU  Anesthesia Type:General  Level of Consciousness: awake, alert , oriented and patient cooperative  Airway & Oxygen Therapy: Patient Spontanous Breathing and Patient connected to face mask oxygen  Post-op Assessment: Report given to PACU RN and Post -op Vital signs reviewed and stable  Post vital signs: Reviewed and stable  Complications: No apparent anesthesia complications

## 2012-12-30 NOTE — Anesthesia Preprocedure Evaluation (Signed)
Anesthesia Evaluation  Patient identified by MRN, date of birth, ID band Patient awake    Reviewed: Allergy & Precautions, H&P , NPO status , Patient's Chart, lab work & pertinent test results  History of Anesthesia Complications (+) PONV  Airway Mallampati: II      Dental  (+) Caps Implants above and below:   Pulmonary neg pulmonary ROS,  breath sounds clear to auscultation        Cardiovascular hypertension, + dysrhythmias Rhythm:Regular Rate:Normal     Neuro/Psych    GI/Hepatic Neg liver ROS, GERD-  ,  Endo/Other  negative endocrine ROS  Renal/GU negative Renal ROS     Musculoskeletal   Abdominal   Peds  Hematology negative hematology ROS (+)   Anesthesia Other Findings   Reproductive/Obstetrics                           Anesthesia Physical Anesthesia Plan  ASA: II  Anesthesia Plan: General   Post-op Pain Management:    Induction: Intravenous  Airway Management Planned: LMA  Additional Equipment:   Intra-op Plan:   Post-operative Plan: Extubation in OR  Informed Consent: I have reviewed the patients History and Physical, chart, labs and discussed the procedure including the risks, benefits and alternatives for the proposed anesthesia with the patient or authorized representative who has indicated his/her understanding and acceptance.   Dental advisory given  Plan Discussed with: CRNA and Surgeon  Anesthesia Plan Comments:         Anesthesia Quick Evaluation

## 2013-01-02 ENCOUNTER — Ambulatory Visit: Payer: Medicare Other | Admitting: Cardiovascular Disease

## 2013-01-02 ENCOUNTER — Encounter (HOSPITAL_BASED_OUTPATIENT_CLINIC_OR_DEPARTMENT_OTHER): Payer: Self-pay | Admitting: Orthopedic Surgery

## 2013-01-04 ENCOUNTER — Ambulatory Visit: Payer: Self-pay | Admitting: Orthopedic Surgery

## 2013-01-04 DIAGNOSIS — M7989 Other specified soft tissue disorders: Secondary | ICD-10-CM | POA: Diagnosis not present

## 2013-01-04 DIAGNOSIS — M79609 Pain in unspecified limb: Secondary | ICD-10-CM | POA: Diagnosis not present

## 2013-01-18 ENCOUNTER — Ambulatory Visit (INDEPENDENT_AMBULATORY_CARE_PROVIDER_SITE_OTHER): Payer: Medicare Other | Admitting: Family Medicine

## 2013-01-18 ENCOUNTER — Encounter: Payer: Self-pay | Admitting: Family Medicine

## 2013-01-18 VITALS — BP 112/72 | HR 67 | Temp 98.8°F | Ht 65.0 in | Wt 210.8 lb

## 2013-01-18 DIAGNOSIS — J329 Chronic sinusitis, unspecified: Secondary | ICD-10-CM | POA: Diagnosis not present

## 2013-01-18 DIAGNOSIS — E538 Deficiency of other specified B group vitamins: Secondary | ICD-10-CM | POA: Diagnosis not present

## 2013-01-18 DIAGNOSIS — B9689 Other specified bacterial agents as the cause of diseases classified elsewhere: Secondary | ICD-10-CM

## 2013-01-18 DIAGNOSIS — A499 Bacterial infection, unspecified: Secondary | ICD-10-CM | POA: Diagnosis not present

## 2013-01-18 MED ORDER — CYANOCOBALAMIN 1000 MCG/ML IJ SOLN
1000.0000 ug | Freq: Once | INTRAMUSCULAR | Status: AC
  Administered 2013-01-18: 1000 ug via INTRAMUSCULAR

## 2013-01-18 MED ORDER — AMOXICILLIN-POT CLAVULANATE 875-125 MG PO TABS: 1.0000 | ORAL_TABLET | Freq: Two times a day (BID) | ORAL | Status: DC

## 2013-01-18 MED ORDER — FLUTICASONE PROPIONATE 50 MCG/ACT NA SUSP: 2.0000 | Freq: Every day | NASAL | Status: DC

## 2013-01-18 NOTE — Assessment & Plan Note (Signed)
Shot today

## 2013-01-18 NOTE — Assessment & Plan Note (Signed)
S/p uri tx with augmentin  flonase for cong and ETD Disc symptomatic care - see instructions on AVS  Update if not starting to improve in a week or if worsening

## 2013-01-18 NOTE — Progress Notes (Signed)
Subjective:    Patient ID: Tammy Manning, female    DOB: 25-Aug-1935, 78 y.o.   MRN: [id number]  HPI Here for symptoms of weakness/ dizziness  Started with a head cold about 2 weeks ago  Has cleared up a bit - but still not 100% Coughing at night Is exhausted  Also using mucinex- dizzy in the ams  Also needs her shot   Knee surgery- went well- still some swelling - but pain is better   No fever Does have facial pain - esp on the L- this is bothersome Ears pop and feel full  Patient Active Problem List  Diagnosis  . B12 DEFICIENCY  . HYPERLIPIDEMIA  . STRESS REACTION, ACUTE, WITH EMOTIONAL DISTURBANCE  . HYPERTENSION, ESSENTIAL NOS  . DYSPEPSIA  . CONSTIPATION  . IBS  . BREAST MASS, LEFT  . POSTMENOPAUSAL STATUS  . DERMATITIS, ATOPIC  . LEG PAIN, RIGHT  . HEADACHE, CHRONIC  . SKIN CANCER, HX OF  . Adverse drug reaction  . Hearing loss of both ears  . Special screening for malignant neoplasms, colon  . Heme positive stool  . Left leg pain  . Left knee pain  . Episodic atrial fibrillation  . Palpitations  . Pre-operative examination  . Bucket-handle tear of lateral meniscus of left knee as current injury   Past Medical History  Diagnosis Date  . Cancer     Skin, basal cell on nose and eyelid  . Hyperlipidemia   . Arthritis     Knees  . DJD (degenerative joint disease)     Low back  . Dyspepsia   . B12 deficiency     Mild  . Lactose intolerance   . Mixed incontinence   . Vulvar irritation     from urine pad, uses Triamcinolone oint.  . Hypertension   . GERD (gastroesophageal reflux disease)   . PONV (postoperative nausea and vomiting)   . Bucket-handle tear of lateral meniscus of left knee as current injury 12/30/2012   Past Surgical History  Procedure Laterality Date  . Abdominal hysterectomy  1982    Large fibroids and bleeding  . Bilateral salpingoophorectomy  1986  . Bone graft hip iliac crest      To Left arm  . Appendectomy    . Breast  reduction surgery    . Hemorrhoid surgery  2006  . Esophagogastroduodenoscopy      Polyps  . Skin cancer eyelid  2012  . Tonsillectomy    . Dilation and curettage of uterus      miscarragesx2  . Knee arthroscopy with lateral menisectomy Left 12/30/2012    Procedure: KNEE ARTHROSCOPY WITH LATERAL MENISECTOMY;  Surgeon: Johnny Bridge, MD;  Location: [facility];  Service: Orthopedics;  Laterality: Left;  LEFT KNEE SCOPE LATERAL MENISCECTOMY   History  Substance Use Topics  . Smoking status: Former Smoker -- 1.00 packs/day for 12 years    Types: Cigarettes    Quit date: 11/24/1963  . Smokeless tobacco: Never Used  . Alcohol Use: Yes     Comment: Rare   Family History  Problem Relation Age of Onset  . Cancer Mother     Colon  . Heart disease Father     CAD  . Diabetes Sister   . Diabetes Brother   . Leukemia Brother   . Cancer Paternal Aunt     Breast  . Diabetes Sister    Allergies  Allergen Reactions  . Amlodipine Besylate  REACTION: increases BP  . Atorvastatin     REACTION: myalgia; shaking muscles  . Cefuroxime Axetil     REACTION: reaction not known  . Ciprofloxacin     REACTION: ? reaction  . Crestor (Rosuvastatin Calcium)     Severe myalgias   . Dextromethorphan-Guaifenesin     REACTION: jittery  . Epinephrine     REACTION: "put me in the hospital"; hives all over body  . Ramipril     REACTION: ? reaction  . Sulfamethoxazole W-Trimethoprim     REACTION: ? reaction   Current Outpatient Prescriptions on File Prior to Visit  Medication Sig Dispense Refill  . aspirin 81 MG tablet Takes 2 tablets daily.      [name] Kitchen atorvastatin (LIPITOR) 10 MG tablet Take one pill by mouth on mon, wed and fri  12 tablet  11  . Calcium Carbonate-Vitamin D (CALCIUM 600 + D PO) 1 tablet daily.       . cholecalciferol (VITAMIN D) 1000 UNITS tablet Take 1,000 Units by mouth daily.        . clorazepate (TRANXENE) 7.5 MG tablet Take 1 tablet (7.5 mg total) by mouth  daily as needed for anxiety.  30 tablet  0  . Coenzyme Q10 200 MG capsule Take 200 mg by mouth daily.        . cyanocobalamin (,VITAMIN B-12,) 1000 MCG/ML injection Inject 1,000 mcg into the muscle. every six weeks       . HYDROcodone-acetaminophen (NORCO) 10-325 MG per tablet Take 1-2 tablets by mouth every 6 (six) hours as needed for pain.  75 tablet  0  . irbesartan (AVAPRO) 150 MG tablet Take 1 tablet (150 mg total) by mouth daily.  90 tablet  1  . NON FORMULARY Ginkabro daily.      [name] Kitchen omeprazole (PRILOSEC) 20 MG capsule as needed.       . sennosides-docusate sodium (SENOKOT-S) 8.6-50 MG tablet Take 1 tablet by mouth daily.  30 tablet  1   No current facility-administered medications on file prior to visit.     Review of Systems Review of Systems  Constitutional: Negative for fever, appetite change,  and unexpected weight change. pos for fatigue  ENT neg for ST or ear drainage / pos for L sided sinus pain  Eyes: Negative for pain and visual disturbance.  Respiratory: Negative for wheeze  and shortness of breath.   Cardiovascular: Negative for cp or palpitations    Gastrointestinal: Negative for nausea, diarrhea and constipation.  Genitourinary: Negative for urgency and frequency.  Skin: Negative for pallor or rash   MSK pos for knee swelling / but pain is much improved  Neurological: Negative for weakness, light-headedness, numbness and headaches.  Hematological: Negative for adenopathy. Does not bruise/bleed easily.  Psychiatric/Behavioral: Negative for dysphoric mood. The patient is not nervous/anxious.         Objective:   Physical Exam  Constitutional: She appears well-developed and well-nourished. No distress.  obese and well appearing   HENT:  Head: Normocephalic and atraumatic.  Right Ear: External ear normal.  Left Ear: External ear normal.  Mouth/Throat: Oropharynx is clear and moist. No oropharyngeal exudate.  Nares are injected and congested  L sided ethmoid and  maxillary sinus  Tenderness noted Some clear rhinorrhea and post nasal drip   Eyes: Conjunctivae and EOM are normal. Pupils are equal, round, and reactive to light. Right eye exhibits no discharge. Left eye exhibits no discharge. No scleral icterus.  Neck: Normal range of motion.  Neck supple. No JVD present. Carotid bruit is not present. No thyromegaly present.  Cardiovascular: Normal rate and regular rhythm.   Pulmonary/Chest: Effort normal and breath sounds normal. No respiratory distress. She has no wheezes.  Lymphadenopathy:    She has no cervical adenopathy.  Neurological: She is alert. She has normal reflexes. No cranial nerve deficit. She exhibits normal muscle tone. Coordination normal.  Skin: Skin is warm and dry. No rash noted. No erythema. No pallor.  Psychiatric: She has a normal mood and affect.          Assessment & Plan:

## 2013-01-18 NOTE — Patient Instructions (Addendum)
I think you have a sinus infection in addition to a cold  Drink lots of water and get rest Take augmentin as directed  Try flonase for congestion and dizziness Update if not starting to improve in a week or if worsening

## 2013-01-19 ENCOUNTER — Ambulatory Visit: Payer: Medicare Other

## 2013-02-07 ENCOUNTER — Telehealth: Payer: Self-pay | Admitting: Family Medicine

## 2013-02-07 DIAGNOSIS — I10 Essential (primary) hypertension: Secondary | ICD-10-CM

## 2013-02-07 DIAGNOSIS — E538 Deficiency of other specified B group vitamins: Secondary | ICD-10-CM

## 2013-02-07 DIAGNOSIS — E785 Hyperlipidemia, unspecified: Secondary | ICD-10-CM

## 2013-02-07 NOTE — Telephone Encounter (Signed)
Message copied by Abner Greenspan on Tue Feb 07, 2013  7:38 AM ------      Message from: Ellamae Sia      Created: Fri Feb 03, 2013 10:09 AM      Regarding: Lab orders for Wednesday, 3.19.14       Patient is scheduled for CPX labs, please order future labs, Thanks , Terri       ------

## 2013-02-08 ENCOUNTER — Other Ambulatory Visit (INDEPENDENT_AMBULATORY_CARE_PROVIDER_SITE_OTHER): Payer: Medicare Other

## 2013-02-08 DIAGNOSIS — I10 Essential (primary) hypertension: Secondary | ICD-10-CM

## 2013-02-08 DIAGNOSIS — E785 Hyperlipidemia, unspecified: Secondary | ICD-10-CM

## 2013-02-08 LAB — COMPREHENSIVE METABOLIC PANEL
ALT: 16 U/L (ref 0–35)
AST: 19 U/L (ref 0–37)
Albumin: 3.8 g/dL (ref 3.5–5.2)
Alkaline Phosphatase: 53 U/L (ref 39–117)
BUN: 17 mg/dL (ref 6–23)
CO2: 26 mEq/L (ref 19–32)
Calcium: 9.3 mg/dL (ref 8.4–10.5)
Chloride: 106 mEq/L (ref 96–112)
Creatinine, Ser: 0.8 mg/dL (ref 0.4–1.2)
GFR: 71.06 mL/min (ref >60.00)
Glucose, Bld: 95 mg/dL (ref 70–99)
Potassium: 4.3 mEq/L (ref 3.5–5.1)
Sodium: 140 mEq/L (ref 135–145)
Total Bilirubin: 0.6 mg/dL (ref 0.3–1.2)
Total Protein: 6.7 g/dL (ref 6.0–8.3)

## 2013-02-08 LAB — CBC WITH DIFFERENTIAL/PLATELET
Basophils Absolute: 0 10*3/uL (ref 0.0–0.1)
Basophils Relative: 0.5 % (ref 0.0–3.0)
Eosinophils Absolute: 0.2 10*3/uL (ref 0.0–0.7)
Eosinophils Relative: 3.6 % (ref 0.0–5.0)
HCT: 38.6 % (ref 36.0–46.0)
Hemoglobin: 13 g/dL (ref 12.0–15.0)
Lymphocytes Relative: 37.8 % (ref 12.0–46.0)
Lymphs Abs: 2.2 10*3/uL (ref 0.7–4.0)
MCHC: 33.6 g/dL (ref 30.0–36.0)
MCV: 91.2 fl (ref 78.0–100.0)
Monocytes Absolute: 0.6 10*3/uL (ref 0.1–1.0)
Monocytes Relative: 10.3 % (ref 3.0–12.0)
Neutro Abs: 2.8 10*3/uL (ref 1.4–7.7)
Neutrophils Relative %: 47.8 % (ref 43.0–77.0)
Platelets: 274 10*3/uL (ref 150.0–400.0)
RBC: 4.24 Mil/uL (ref 3.87–5.11)
RDW: 14 % (ref 11.5–14.6)
WBC: 5.8 10*3/uL (ref 4.5–10.5)

## 2013-02-08 LAB — LIPID PANEL
Cholesterol: 202 mg/dL — ABNORMAL HIGH (ref 0–200)
HDL: 66.5 mg/dL (ref >39.00)
Total CHOL/HDL Ratio: 3
Triglycerides: 102 mg/dL (ref 0.0–149.0)
VLDL: 20.4 mg/dL (ref 0.0–40.0)

## 2013-02-08 LAB — TSH: TSH: 1.1 u[IU]/mL (ref 0.35–5.50)

## 2013-02-08 LAB — LDL CHOLESTEROL, DIRECT: Direct LDL: 105.9 mg/dL

## 2013-02-09 ENCOUNTER — Encounter: Payer: Self-pay | Admitting: *Deleted

## 2013-02-15 ENCOUNTER — Other Ambulatory Visit: Payer: Medicare Other

## 2013-02-22 ENCOUNTER — Encounter: Payer: Medicare Other | Admitting: Family Medicine

## 2013-02-28 ENCOUNTER — Ambulatory Visit (INDEPENDENT_AMBULATORY_CARE_PROVIDER_SITE_OTHER): Payer: Medicare Other | Admitting: Family Medicine

## 2013-02-28 ENCOUNTER — Encounter: Payer: Self-pay | Admitting: Family Medicine

## 2013-02-28 VITALS — BP 122/60 | HR 66 | Temp 97.9°F | Ht 64.25 in | Wt 204.0 lb

## 2013-02-28 DIAGNOSIS — I10 Essential (primary) hypertension: Secondary | ICD-10-CM

## 2013-02-28 DIAGNOSIS — Z23 Encounter for immunization: Secondary | ICD-10-CM | POA: Diagnosis not present

## 2013-02-28 DIAGNOSIS — E785 Hyperlipidemia, unspecified: Secondary | ICD-10-CM

## 2013-02-28 DIAGNOSIS — E538 Deficiency of other specified B group vitamins: Secondary | ICD-10-CM | POA: Diagnosis not present

## 2013-02-28 DIAGNOSIS — R112 Nausea with vomiting, unspecified: Secondary | ICD-10-CM | POA: Diagnosis not present

## 2013-02-28 DIAGNOSIS — Z0000 Encounter for general adult medical examination without abnormal findings: Secondary | ICD-10-CM

## 2013-02-28 MED ORDER — CYANOCOBALAMIN 1000 MCG/ML IJ SOLN
1000.0000 ug | Freq: Once | INTRAMUSCULAR | Status: AC
  Administered 2013-02-28: 1000 ug via INTRAMUSCULAR

## 2013-02-28 NOTE — Assessment & Plan Note (Signed)
bp in fair control at this time  No changes needed  Disc lifstyle change with low sodium diet and exercise  Labs rev

## 2013-02-28 NOTE — Assessment & Plan Note (Signed)
Shot today

## 2013-02-28 NOTE — Patient Instructions (Addendum)
If you are interested in a shingles/zoster vaccine - call your insurance to check on coverage,( you should not get it within 1 month of other vaccines) , then call us for a prescription  for it to take to a pharmacy that gives the shot , or make a nurse visit to get it here depending on your coverage Td (tetanus shot) today Take good care of yourself  B12 shot today  Don't forget to make your own mammogram appt

## 2013-02-28 NOTE — Assessment & Plan Note (Signed)
Reviewed health habits including diet and exercise and skin cancer prevention Also reviewed health mt list, fam hx and immunizations  Wellness labs ref Td today

## 2013-02-28 NOTE — Progress Notes (Signed)
Subjective:    Patient ID: Tammy Manning, female    DOB: 10-04-1935, 78 y.o.   MRN: [id number]  HPI  Is doing fair - though having stomach / low abd issues lately - will see Dr Allyn Kenner today Wt is down 6 lb -- has been living off toast and tea and rice   I have personally reviewed the Medicare Annual Wellness questionnaire and have noted 1. The patient's medical and social history 2. Their use of alcohol, tobacco or illicit drugs 3. Their current medications and supplements 4. The patient's functional ability including ADL's, fall risks, home safety risks and hearing or visual             impairment. 5. Diet and physical activities 6. Evidence for depression or mood disorders  The patients weight, height, BMI have been recorded in the chart and visual acuity is per eye clinic.  I have made referrals, counseling and provided education to the patient based review of the above and I have provided the pt with a written personalized care plan for preventive services.  See scanned forms.  Routine anticipatory guidance given to patient.  See health maintenance. Flu imm 9/13 Shingles status- would be interested in one if insurance covers PNA imm 09 Tetanus imm 4/04- will do that today  Colonoscopy 4/13 polyps/ fam hx  Breast cancer screening mammogram at [place]- in 2013 - she will make her own appt  Self exam-no lumps or changes   Advance directive-- has a living will / but did not make a POA - will think about that  Cognitive function addressed- see scanned forms- and if abnormal then additional documentation follows.  No concerns   Falls-none at all   Mood-is fine / even with stressors / and very good support - stays motivated and active   bp is stable today  No cp or palpitations or headaches or edema  No side effects to medicines  BP Readings from Last 3 Encounters:  02/28/13 122/60  01/18/13 112/72  12/30/12 150/55     Hyperlipidemia on lipitor Lab Results  Component  Value Date   CHOL 202* 02/08/2013   CHOL 224* 09/12/2012   CHOL 207* 12/31/2011   Lab Results  Component Value Date   HDL 66.50 02/08/2013   HDL 60.40 09/12/2012   HDL 66.70 12/31/2011   Lab Results  Component Value Date   LDLCALC 95 09/24/2010   LDLCALC 90 08/22/2009   LDLCALC 107* 08/20/2008   Lab Results  Component Value Date   TRIG 102.0 02/08/2013   TRIG 151.0* 09/12/2012   TRIG 122.0 12/31/2011   Lab Results  Component Value Date   CHOLHDL 3 02/08/2013   CHOLHDL 4 09/12/2012   CHOLHDL 3 12/31/2011   Lab Results  Component Value Date   LDLDIRECT 105.9 02/08/2013   LDLDIRECT 125.0 09/12/2012   LDLDIRECT 119.3 12/31/2011   very good cholesterol profile  She does eat a healthy diet -works on that    [place] and [place] reviewed  Meds, vitals, and allergies reviewed.   Needs her B12 shot today  Patient Active Problem List  Diagnosis  . B12 DEFICIENCY  . HYPERLIPIDEMIA  . STRESS REACTION, ACUTE, WITH EMOTIONAL DISTURBANCE  . HYPERTENSION, ESSENTIAL NOS  . DYSPEPSIA  . CONSTIPATION  . IBS  . BREAST MASS, LEFT  . POSTMENOPAUSAL STATUS  . DERMATITIS, ATOPIC  . LEG PAIN, RIGHT  . HEADACHE, CHRONIC  . SKIN CANCER, HX OF  . Adverse drug reaction  . Hearing loss  of both ears  . Special screening for malignant neoplasms, colon  . Heme positive stool  . Left leg pain  . Left knee pain  . Episodic atrial fibrillation  . Palpitations  . Pre-operative examination  . Bucket-handle tear of lateral meniscus of left knee as current injury  . Encounter for Medicare annual wellness exam   Past Medical History  Diagnosis Date  . Cancer     Skin, basal cell on nose and eyelid  . Hyperlipidemia   . Arthritis     Knees  . DJD (degenerative joint disease)     Low back  . Dyspepsia   . B12 deficiency     Mild  . Lactose intolerance   . Mixed incontinence   . Vulvar irritation     from urine pad, uses Triamcinolone oint.  . Hypertension   . GERD (gastroesophageal reflux disease)    . PONV (postoperative nausea and vomiting)   . Bucket-handle tear of lateral meniscus of left knee as current injury 12/30/2012   Past Surgical History  Procedure Laterality Date  . Abdominal hysterectomy  1982    Large fibroids and bleeding  . Bilateral salpingoophorectomy  1986  . Bone graft hip iliac crest      To Left arm  . Appendectomy    . Breast reduction surgery    . Hemorrhoid surgery  2006  . Esophagogastroduodenoscopy      Polyps  . Skin cancer eyelid  2012  . Tonsillectomy    . Dilation and curettage of uterus      miscarragesx2  . Knee arthroscopy with lateral menisectomy Left 12/30/2012    Procedure: KNEE ARTHROSCOPY WITH LATERAL MENISECTOMY;  Surgeon: Johnny Bridge, MD;  Location: [facility];  Service: Orthopedics;  Laterality: Left;  LEFT KNEE SCOPE LATERAL MENISCECTOMY   History  Substance Use Topics  . Smoking status: Former Smoker -- 1.00 packs/day for 12 years    Types: Cigarettes    Quit date: 11/24/1963  . Smokeless tobacco: Never Used  . Alcohol Use: No     Comment: Rare   Family History  Problem Relation Age of Onset  . Cancer Mother     Colon  . Heart disease Father     CAD  . Diabetes Sister   . Diabetes Brother   . Leukemia Brother   . Cancer Paternal Aunt     Breast  . Diabetes Sister    Allergies  Allergen Reactions  . Amlodipine Besylate     REACTION: increases BP  . Atorvastatin     REACTION: myalgia; shaking muscles  . Cefuroxime Axetil     REACTION: reaction not known  . Ciprofloxacin     REACTION: ? reaction  . Crestor (Rosuvastatin Calcium)     Severe myalgias   . Dextromethorphan-Guaifenesin     REACTION: jittery  . Epinephrine     REACTION: "put me in the hospital"; hives all over body  . Ramipril     REACTION: ? reaction  . Sulfamethoxazole W-Trimethoprim     REACTION: ? reaction   Current Outpatient Prescriptions on File Prior to Visit  Medication Sig Dispense Refill  . aspirin 81 MG tablet  Takes 2 tablets daily.      [name] Kitchen atorvastatin (LIPITOR) 10 MG tablet Take one pill by mouth on mon, wed and fri  12 tablet  11  . Calcium Carbonate-Vitamin D (CALCIUM 600 + D PO) 1 tablet daily.       [name] Kitchen  cholecalciferol (VITAMIN D) 1000 UNITS tablet Take 1,000 Units by mouth daily.        . clorazepate (TRANXENE) 7.5 MG tablet Take 1 tablet (7.5 mg total) by mouth daily as needed for anxiety.  30 tablet  0  . Coenzyme Q10 200 MG capsule Take 200 mg by mouth daily.        . cyanocobalamin (,VITAMIN B-12,) 1000 MCG/ML injection Inject 1,000 mcg into the muscle. every six weeks       . fluticasone (FLONASE) 50 MCG/ACT nasal spray Place 2 sprays into the nose daily.  16 g  11  . irbesartan (AVAPRO) 150 MG tablet Take 1 tablet (150 mg total) by mouth daily.  90 tablet  1  . NON FORMULARY Ginkabro daily.      [name] Kitchen omeprazole (PRILOSEC) 20 MG capsule as needed.        No current facility-administered medications on file prior to visit.    Review of Systems Review of Systems  Constitutional: Negative for fever, appetite change, fatigue and unexpected weight change.  Eyes: Negative for pain and visual disturbance.  Respiratory: Negative for cough and shortness of breath.   Cardiovascular: Negative for cp or palpitations    Gastrointestinal: pos for abd discomfort/ bloating/neg for blood in stool or diarrhea   Genitourinary: Negative for urgency and frequency. neg for dysuria  Skin: Negative for pallor or rash   Neurological: Negative for weakness, light-headedness, numbness and headaches.  Hematological: Negative for adenopathy. Does not bruise/bleed easily.  Psychiatric/Behavioral: Negative for dysphoric mood. The patient is not nervous/anxious.         Objective:   Physical Exam  Constitutional: She appears well-developed and well-nourished. No distress.  obese and well appearing   HENT:  Head: Normocephalic and atraumatic.  Right Ear: External ear normal.  Left Ear: External ear normal.   Nose: Nose normal.  Mouth/Throat: Oropharynx is clear and moist.  Eyes: Conjunctivae and EOM are normal. Pupils are equal, round, and reactive to light. Right eye exhibits no discharge. Left eye exhibits no discharge. No scleral icterus.  Neck: Normal range of motion. Neck supple. No JVD present. Carotid bruit is not present. No thyromegaly present.  Cardiovascular: Normal rate, regular rhythm, normal heart sounds and intact distal pulses.  Exam reveals no gallop.   Pulmonary/Chest: Effort normal and breath sounds normal. No respiratory distress. She has no wheezes. She has no rales.  Abdominal: Soft. Bowel sounds are normal. She exhibits no distension, no abdominal bruit and no mass. There is no tenderness.  Genitourinary: No breast swelling, tenderness, discharge or bleeding.  Breast exam: No mass, nodules, thickening, tenderness, bulging, retraction, inflamation, nipple discharge or skin changes noted.  No axillary or clavicular LA.  Chaperoned exam.    Musculoskeletal: Normal range of motion. She exhibits no edema and no tenderness.  Lymphadenopathy:    She has no cervical adenopathy.  Neurological: She is alert. She has normal reflexes. No cranial nerve deficit. She exhibits normal muscle tone. Coordination normal.  Skin: Skin is warm and dry. No rash noted. No erythema. No pallor.  Psychiatric: She has a normal mood and affect.          Assessment & Plan:

## 2013-02-28 NOTE — Assessment & Plan Note (Signed)
Good control statin and diet  Disc goals for lipids and reasons to control them Rev labs with pt Rev low sat fat diet in detail

## 2013-03-01 ENCOUNTER — Other Ambulatory Visit: Payer: Self-pay | Admitting: Gastroenterology

## 2013-03-01 DIAGNOSIS — R112 Nausea with vomiting, unspecified: Secondary | ICD-10-CM

## 2013-03-02 DIAGNOSIS — R109 Unspecified abdominal pain: Secondary | ICD-10-CM | POA: Diagnosis not present

## 2013-03-02 DIAGNOSIS — R112 Nausea with vomiting, unspecified: Secondary | ICD-10-CM | POA: Diagnosis not present

## 2013-03-02 DIAGNOSIS — R1013 Epigastric pain: Secondary | ICD-10-CM | POA: Diagnosis not present

## 2013-03-02 DIAGNOSIS — D131 Benign neoplasm of stomach: Secondary | ICD-10-CM | POA: Diagnosis not present

## 2013-03-08 ENCOUNTER — Ambulatory Visit
Admission: RE | Admit: 2013-03-08 | Discharge: 2013-03-08 | Disposition: A | Discharge status: Home / Self Care | Payer: Medicare Other | Source: Ambulatory Visit | Attending: Gastroenterology | Admitting: Gastroenterology

## 2013-03-08 DIAGNOSIS — K7689 Other specified diseases of liver: Secondary | ICD-10-CM | POA: Diagnosis not present

## 2013-03-08 DIAGNOSIS — R112 Nausea with vomiting, unspecified: Secondary | ICD-10-CM

## 2013-03-14 ENCOUNTER — Other Ambulatory Visit: Payer: Self-pay | Admitting: Gastroenterology

## 2013-03-14 DIAGNOSIS — R140 Abdominal distension (gaseous): Secondary | ICD-10-CM

## 2013-03-28 ENCOUNTER — Ambulatory Visit
Admission: RE | Admit: 2013-03-28 | Discharge: 2013-03-28 | Disposition: A | Discharge status: Home / Self Care | Payer: Medicare Other | Source: Ambulatory Visit | Attending: Gastroenterology | Admitting: Gastroenterology

## 2013-03-28 DIAGNOSIS — R143 Flatulence: Secondary | ICD-10-CM | POA: Diagnosis not present

## 2013-03-28 DIAGNOSIS — R141 Gas pain: Secondary | ICD-10-CM | POA: Diagnosis not present

## 2013-03-28 DIAGNOSIS — R140 Abdominal distension (gaseous): Secondary | ICD-10-CM

## 2013-03-28 DIAGNOSIS — R110 Nausea: Secondary | ICD-10-CM | POA: Diagnosis not present

## 2013-05-02 DIAGNOSIS — H2510 Age-related nuclear cataract, unspecified eye: Secondary | ICD-10-CM | POA: Diagnosis not present

## 2013-05-03 ENCOUNTER — Other Ambulatory Visit: Payer: Self-pay | Admitting: *Deleted

## 2013-05-03 MED ORDER — IRBESARTAN 150 MG PO TABS: 150.0000 mg | ORAL_TABLET | Freq: Every day | ORAL | Status: DC

## 2013-05-19 ENCOUNTER — Ambulatory Visit (INDEPENDENT_AMBULATORY_CARE_PROVIDER_SITE_OTHER): Payer: Medicare Other | Admitting: *Deleted

## 2013-05-19 DIAGNOSIS — E538 Deficiency of other specified B group vitamins: Secondary | ICD-10-CM | POA: Diagnosis not present

## 2013-05-19 MED ORDER — CYANOCOBALAMIN 1000 MCG/ML IJ SOLN
1000.0000 ug | Freq: Once | INTRAMUSCULAR | Status: AC
  Administered 2013-05-19: 1000 ug via INTRAMUSCULAR

## 2013-05-27 ENCOUNTER — Other Ambulatory Visit: Payer: Self-pay | Admitting: Family Medicine

## 2013-06-01 DIAGNOSIS — Z1231 Encounter for screening mammogram for malignant neoplasm of breast: Secondary | ICD-10-CM | POA: Diagnosis not present

## 2013-06-02 ENCOUNTER — Encounter: Payer: Self-pay | Admitting: Family Medicine

## 2013-06-06 ENCOUNTER — Encounter: Payer: Self-pay | Admitting: *Deleted

## 2013-06-23 ENCOUNTER — Other Ambulatory Visit: Payer: Self-pay | Admitting: Family Medicine

## 2013-06-23 MED ORDER — CLORAZEPATE DIPOTASSIUM 7.5 MG PO TABS: 7.5000 mg | ORAL_TABLET | Freq: Every day | ORAL | Status: DC | PRN

## 2013-06-23 NOTE — Telephone Encounter (Signed)
Px written for call in   

## 2013-06-23 NOTE — Telephone Encounter (Signed)
Phoned in to pharmacy.

## 2013-06-29 ENCOUNTER — Encounter: Payer: Self-pay | Admitting: Radiology

## 2013-06-30 ENCOUNTER — Ambulatory Visit (INDEPENDENT_AMBULATORY_CARE_PROVIDER_SITE_OTHER): Payer: Medicare Other | Admitting: *Deleted

## 2013-06-30 DIAGNOSIS — E538 Deficiency of other specified B group vitamins: Secondary | ICD-10-CM | POA: Diagnosis not present

## 2013-06-30 MED ORDER — CYANOCOBALAMIN 1000 MCG/ML IJ SOLN
1000.0000 ug | Freq: Once | INTRAMUSCULAR | Status: AC
  Administered 2013-06-30: 1000 ug via INTRAMUSCULAR

## 2013-07-04 DIAGNOSIS — Z79899 Other long term (current) drug therapy: Secondary | ICD-10-CM | POA: Diagnosis not present

## 2013-07-12 ENCOUNTER — Encounter: Payer: Self-pay | Admitting: Family Medicine

## 2013-08-15 DIAGNOSIS — Z23 Encounter for immunization: Secondary | ICD-10-CM | POA: Diagnosis not present

## 2013-08-16 ENCOUNTER — Ambulatory Visit (INDEPENDENT_AMBULATORY_CARE_PROVIDER_SITE_OTHER): Payer: Medicare Other

## 2013-08-16 ENCOUNTER — Ambulatory Visit (INDEPENDENT_AMBULATORY_CARE_PROVIDER_SITE_OTHER): Payer: Medicare Other | Admitting: *Deleted

## 2013-08-16 DIAGNOSIS — E538 Deficiency of other specified B group vitamins: Secondary | ICD-10-CM | POA: Diagnosis not present

## 2013-08-16 DIAGNOSIS — Z23 Encounter for immunization: Secondary | ICD-10-CM | POA: Diagnosis not present

## 2013-08-16 MED ORDER — CYANOCOBALAMIN 1000 MCG/ML IJ SOLN
1000.0000 ug | Freq: Once | INTRAMUSCULAR | Status: AC
  Administered 2013-08-16: 1000 ug via INTRAMUSCULAR

## 2013-09-11 DIAGNOSIS — J039 Acute tonsillitis, unspecified: Secondary | ICD-10-CM | POA: Diagnosis not present

## 2013-09-11 DIAGNOSIS — J029 Acute pharyngitis, unspecified: Secondary | ICD-10-CM | POA: Diagnosis not present

## 2013-09-13 DIAGNOSIS — J039 Acute tonsillitis, unspecified: Secondary | ICD-10-CM | POA: Diagnosis not present

## 2013-09-13 DIAGNOSIS — J029 Acute pharyngitis, unspecified: Secondary | ICD-10-CM | POA: Diagnosis not present

## 2013-09-16 DIAGNOSIS — B370 Candidal stomatitis: Secondary | ICD-10-CM | POA: Diagnosis not present

## 2013-09-16 DIAGNOSIS — R030 Elevated blood-pressure reading, without diagnosis of hypertension: Secondary | ICD-10-CM | POA: Diagnosis not present

## 2013-09-18 DIAGNOSIS — J029 Acute pharyngitis, unspecified: Secondary | ICD-10-CM | POA: Diagnosis not present

## 2013-09-18 DIAGNOSIS — B370 Candidal stomatitis: Secondary | ICD-10-CM | POA: Diagnosis not present

## 2013-09-18 DIAGNOSIS — J039 Acute tonsillitis, unspecified: Secondary | ICD-10-CM | POA: Diagnosis not present

## 2013-09-19 ENCOUNTER — Ambulatory Visit: Payer: Medicare Other | Admitting: Family Medicine

## 2013-09-27 ENCOUNTER — Ambulatory Visit (INDEPENDENT_AMBULATORY_CARE_PROVIDER_SITE_OTHER): Payer: Medicare Other

## 2013-09-27 DIAGNOSIS — E538 Deficiency of other specified B group vitamins: Secondary | ICD-10-CM

## 2013-09-27 MED ORDER — CYANOCOBALAMIN 1000 MCG/ML IJ SOLN
1000.0000 ug | Freq: Once | INTRAMUSCULAR | Status: AC
  Administered 2013-09-27: 1000 ug via INTRAMUSCULAR

## 2013-11-08 ENCOUNTER — Ambulatory Visit: Payer: Medicare Other

## 2013-11-10 ENCOUNTER — Encounter: Payer: Self-pay | Admitting: Family Medicine

## 2013-11-10 ENCOUNTER — Ambulatory Visit (INDEPENDENT_AMBULATORY_CARE_PROVIDER_SITE_OTHER): Payer: Medicare Other | Admitting: Family Medicine

## 2013-11-10 VITALS — BP 118/64 | HR 83 | Temp 98.4°F | Ht 64.25 in | Wt 213.0 lb

## 2013-11-10 DIAGNOSIS — E538 Deficiency of other specified B group vitamins: Secondary | ICD-10-CM

## 2013-11-10 DIAGNOSIS — R4589 Other symptoms and signs involving emotional state: Secondary | ICD-10-CM | POA: Diagnosis not present

## 2013-11-10 MED ORDER — CYANOCOBALAMIN 1000 MCG/ML IJ SOLN
1000.0000 ug | Freq: Once | INTRAMUSCULAR | Status: AC
  Administered 2013-11-10: 1000 ug via INTRAMUSCULAR

## 2013-11-10 NOTE — Progress Notes (Signed)
Pre-visit discussion using our clinic review tool. No additional management support is needed unless otherwise documented below in the visit note.  

## 2013-11-10 NOTE — Patient Instructions (Signed)
Stop up front to get a counseling referral  Give some thought to attending an [facility] meeting  B12 shot today  Keep me posted with how you are doing

## 2013-11-10 NOTE — Progress Notes (Signed)
Subjective:    Patient ID: Tammy Manning, female    DOB: December 10, 1934, 78 y.o.   MRN: [id number]  HPI Here for stress/ emotional problems   Many stressors : Her sister moved into the same town Medically problematic and very very dependent on her - she is constantly confused  Takes her to ER and doctor visits constantly due to drug addition and back pain  This individual denies she has a drug problem  She is now in [facility] - rehab   - and now pt and her husband are caring for her household and her dog  A lot of anger  Before this she was taking care of a friend   She knows she needs to cut off the relationship completely  She has to "save herself" She needs help to carry out her plan     She is anxious and she eats when she is stressed  She falls asleep  but wakes up early  Not experiencing anhedonia - still enjoys her grandkids  Has very good support   Art is a good distraction and coping mech for her    Patient Active Problem List   Diagnosis Date Noted  . Encounter for Medicare annual wellness exam 02/28/2013  . Bucket-handle tear of lateral meniscus of left knee as current injury 12/30/2012  . Pre-operative examination 11/29/2012  . Palpitations 11/26/2012  . Episodic atrial fibrillation 11/17/2012  . Left knee pain 09/12/2012  . Left leg pain 08/29/2012  . Heme positive stool 02/05/2012  . Special screening for malignant neoplasms, colon 01/21/2012  . Hearing loss of both ears 01/14/2012  . Adverse drug reaction 03/12/2011  . HEADACHE, CHRONIC 09/24/2010  . LEG PAIN, RIGHT 08/04/2010  . BREAST MASS, LEFT 03/24/2010  . B12 DEFICIENCY 03/17/2010  . STRESS REACTION, ACUTE, WITH EMOTIONAL DISTURBANCE 03/17/2010  . DYSPEPSIA 03/17/2010  . POSTMENOPAUSAL STATUS 08/20/2008  . HYPERTENSION, ESSENTIAL NOS 07/19/2007  . HYPERLIPIDEMIA 06/24/2007  . CONSTIPATION 06/24/2007  . IBS 06/24/2007  . DERMATITIS, ATOPIC 06/24/2007  . SKIN CANCER, HX OF 06/24/2007   Past  Medical History  Diagnosis Date  . Cancer     Skin, basal cell on nose and eyelid  . Hyperlipidemia   . Arthritis     Knees  . DJD (degenerative joint disease)     Low back  . Dyspepsia   . B12 deficiency     Mild  . Lactose intolerance   . Mixed incontinence   . Vulvar irritation     from urine pad, uses Triamcinolone oint.  . Hypertension   . GERD (gastroesophageal reflux disease)   . PONV (postoperative nausea and vomiting)   . Bucket-handle tear of lateral meniscus of left knee as current injury 12/30/2012   Past Surgical History  Procedure Laterality Date  . Abdominal hysterectomy  1982    Large fibroids and bleeding  . Bilateral salpingoophorectomy  1986  . Bone graft hip iliac crest      To Left arm  . Appendectomy    . Breast reduction surgery    . Hemorrhoid surgery  2006  . Esophagogastroduodenoscopy      Polyps  . Skin cancer eyelid  2012  . Tonsillectomy    . Dilation and curettage of uterus      miscarragesx2  . Knee arthroscopy with lateral menisectomy Left 12/30/2012    Procedure: KNEE ARTHROSCOPY WITH LATERAL MENISECTOMY;  Surgeon: Johnny Bridge, MD;  Location: [facility];  Service: Orthopedics;  Laterality: Left;  LEFT KNEE SCOPE LATERAL MENISCECTOMY   History  Substance Use Topics  . Smoking status: Former Smoker -- 1.00 packs/day for 12 years    Types: Cigarettes    Quit date: 11/24/1963  . Smokeless tobacco: Never Used  . Alcohol Use: No     Comment: Rare   Family History  Problem Relation Age of Onset  . Cancer Mother     Colon  . Heart disease Father     CAD  . Diabetes Sister   . Diabetes Brother   . Leukemia Brother   . Cancer Paternal Aunt     Breast  . Diabetes Sister    Allergies  Allergen Reactions  . Amlodipine Besylate     REACTION: increases BP  . Atorvastatin     REACTION: myalgia; shaking muscles  . Cefuroxime Axetil     REACTION: reaction not known  . Ciprofloxacin     REACTION: ? reaction  .  Crestor [Rosuvastatin Calcium]     Severe myalgias   . Dextromethorphan-Guaifenesin     REACTION: jittery  . Epinephrine     REACTION: "put me in the hospital"; hives all over body  . Ramipril     REACTION: ? reaction  . Sulfamethoxazole-Trimethoprim     REACTION: ? reaction   Current Outpatient Prescriptions on File Prior to Visit  Medication Sig Dispense Refill  . aspirin 81 MG tablet Takes 2 tablets daily.      [name] Kitchen atorvastatin (LIPITOR) 10 MG tablet Take one pill by mouth on mon, wed and fri  12 tablet  11  . Calcium Carbonate-Vitamin D (CALCIUM 600 + D PO) 1 tablet daily.       . cholecalciferol (VITAMIN D) 1000 UNITS tablet Take 1,000 Units by mouth daily.        . clorazepate (TRANXENE) 7.5 MG tablet Take 1 tablet (7.5 mg total) by mouth daily as needed for anxiety.  30 tablet  0  . Coenzyme Q10 200 MG capsule Take 200 mg by mouth daily.        . cyanocobalamin (,VITAMIN B-12,) 1000 MCG/ML injection Inject 1,000 mcg into the muscle. every six weeks       . fluticasone (FLONASE) 50 MCG/ACT nasal spray Place 2 sprays into the nose daily.  16 g  11  . irbesartan (AVAPRO) 150 MG tablet Take 1 tablet (150 mg total) by mouth daily.  90 tablet  1  . NON FORMULARY Ginkabro daily.      [name] Kitchen omeprazole (PRILOSEC) 20 MG capsule as needed.        No current facility-administered medications on file prior to visit.    Review of Systems Review of Systems  Constitutional: Negative for fever, appetite change, fatigue and unexpected weight change.  Eyes: Negative for pain and visual disturbance.  Respiratory: Negative for cough and shortness of breath.   Cardiovascular: Negative for cp or palpitations    Gastrointestinal: Negative for nausea, diarrhea and constipation.  Genitourinary: Negative for urgency and frequency.  Skin: Negative for pallor or rash   Neurological: Negative for weakness, light-headedness, numbness and headaches.  Hematological: Negative for adenopathy. Does not  bruise/bleed easily.  Psychiatric/Behavioral: pos for dysphoric mood and anxiety with tearfulness , neg for SI       Objective:   Physical Exam  Constitutional: She appears well-developed and well-nourished. No distress.  obese and well appearing   HENT:  Head: Normocephalic and atraumatic.  Eyes: Conjunctivae and EOM are normal. Pupils are  equal, round, and reactive to light. No scleral icterus.  Neck: Normal range of motion. Neck supple. No JVD present. No thyromegaly present.  Cardiovascular: Normal rate, regular rhythm, normal heart sounds and intact distal pulses.  Exam reveals no gallop.   Pulmonary/Chest: Effort normal and breath sounds normal. No respiratory distress.  Abdominal: Soft. Bowel sounds are normal.  Lymphadenopathy:    She has no cervical adenopathy.  Neurological: She is alert. She has normal reflexes.  Skin: Skin is warm and dry. No pallor.  Psychiatric: Her speech is normal and behavior is normal. Thought content normal. Her mood appears anxious. Her affect is not inappropriate. She exhibits a depressed mood.  Tearful at times when discussing stressors and home situation  Husband is supportive           Assessment & Plan:

## 2013-11-12 NOTE — Assessment & Plan Note (Signed)
Injection today

## 2013-11-12 NOTE — Assessment & Plan Note (Addendum)
Reviewed stressors/ coping techniques/symptoms/ support sources/ tx options and side effects in detail today This seems to be a situational issue-pt agrees  Ref to counseling asap  Overall coping mechanisms are fair- and support is good  Disc difficult decision to distance herself from her sister who is causing most of the problems Also suggested consideration of alanon meetings  >25 min spent with face to face with patient, >50% counseling and/or coordinating care

## 2013-11-13 ENCOUNTER — Other Ambulatory Visit: Payer: Self-pay | Admitting: Family Medicine

## 2013-11-26 ENCOUNTER — Other Ambulatory Visit: Payer: Self-pay | Admitting: Family Medicine

## 2013-11-28 ENCOUNTER — Other Ambulatory Visit: Payer: Self-pay | Admitting: Family Medicine

## 2013-12-07 ENCOUNTER — Ambulatory Visit: Payer: Medicare Other | Admitting: Psychology

## 2014-01-16 ENCOUNTER — Ambulatory Visit: Payer: Medicare Other

## 2014-02-01 ENCOUNTER — Ambulatory Visit (INDEPENDENT_AMBULATORY_CARE_PROVIDER_SITE_OTHER): Payer: Medicare Other

## 2014-02-01 DIAGNOSIS — E538 Deficiency of other specified B group vitamins: Secondary | ICD-10-CM | POA: Diagnosis not present

## 2014-02-01 MED ORDER — CYANOCOBALAMIN 1000 MCG/ML IJ SOLN
1000.0000 ug | Freq: Once | INTRAMUSCULAR | Status: AC
  Administered 2014-02-01: 1000 ug via INTRAMUSCULAR

## 2014-02-08 ENCOUNTER — Other Ambulatory Visit: Payer: Self-pay | Admitting: Family Medicine

## 2014-02-09 ENCOUNTER — Other Ambulatory Visit: Payer: Self-pay | Admitting: Family Medicine

## 2014-02-15 IMAGING — US US EXTREM LOW VENOUS*L*
1 series · 14 of 24 positions shown · non-contrast
Comparison: none

REASON FOR EXAM: RECENT 4 HR CAR TRIP, L CALF PAIN
COMMENTS:   May transport without cardiac monitor

[Series 1: us extrem low venous*left* · 0.11mm/px · 14 of 29 slices shown]
[im 1/29]
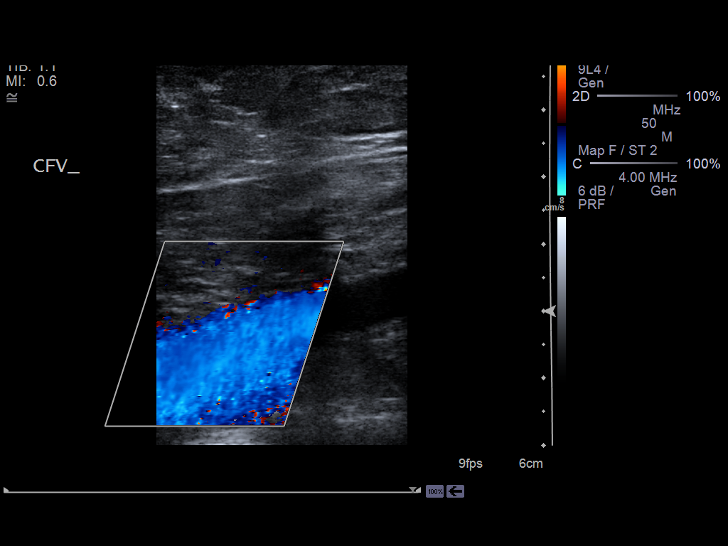
[im 3/29]
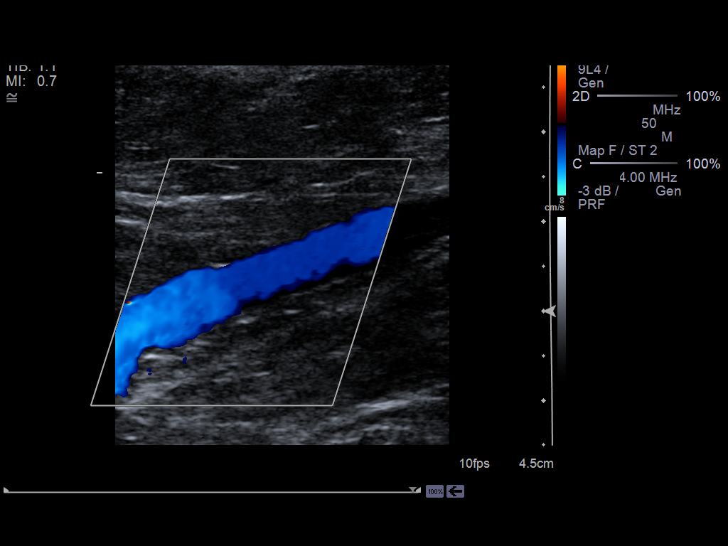
[im 5/29]
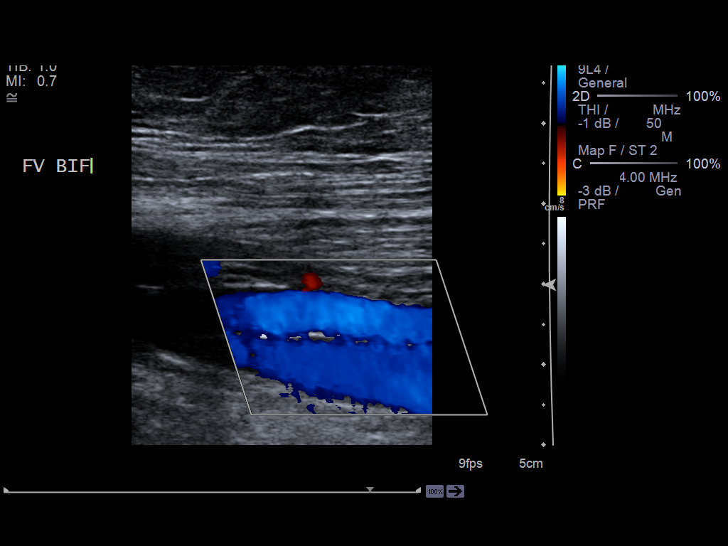
[im 8/29]
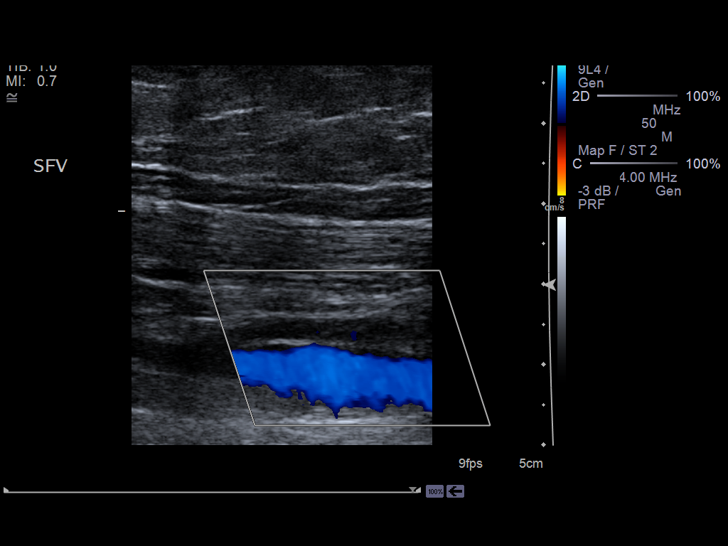
[im 9/29]
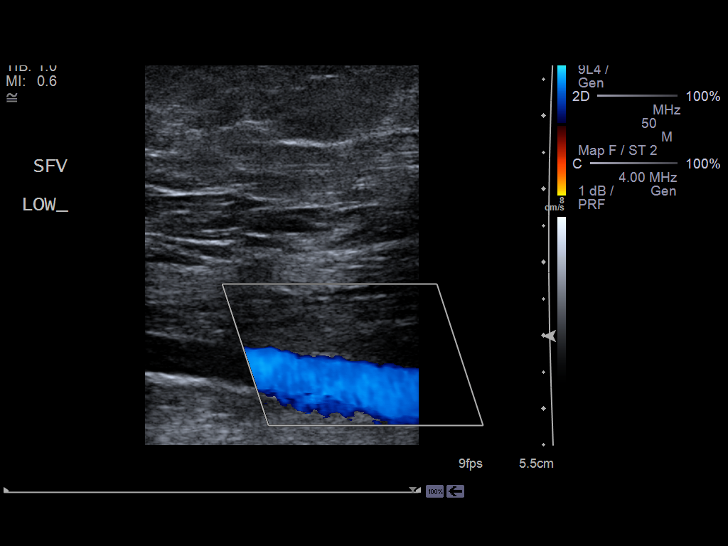
[im 11/29]
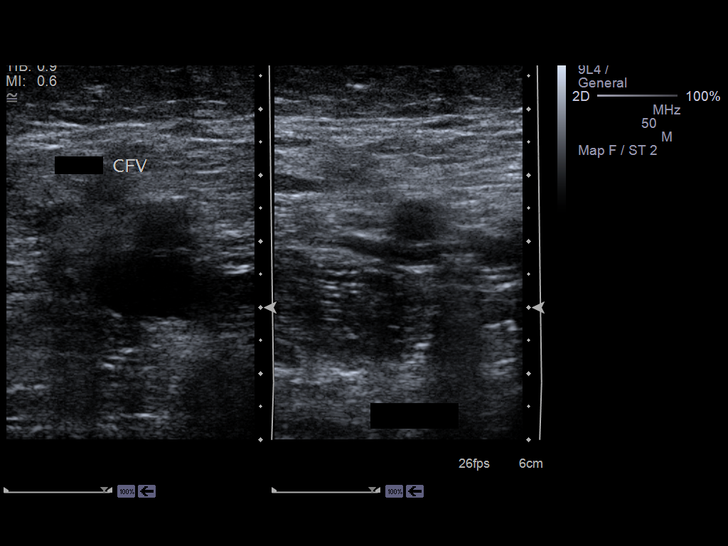
[im 14/29]
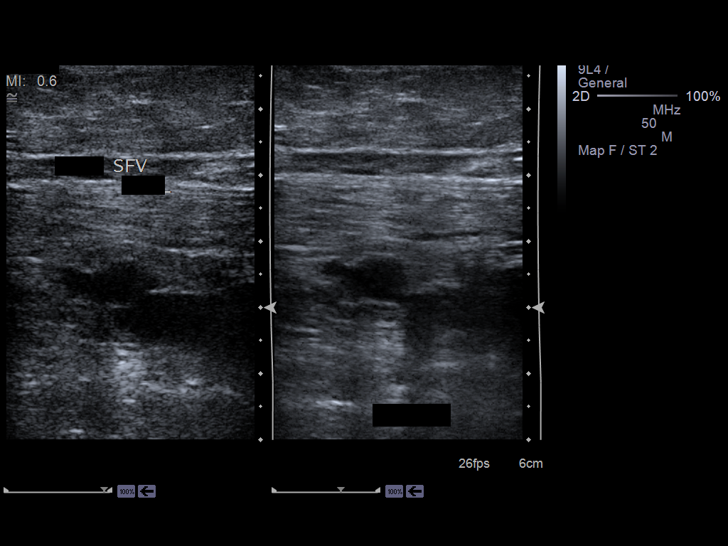
[im 15/29]
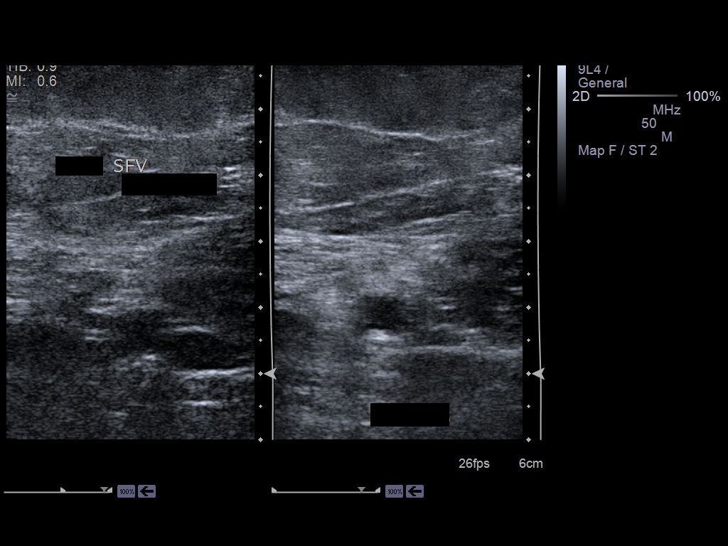
[im 18/29]
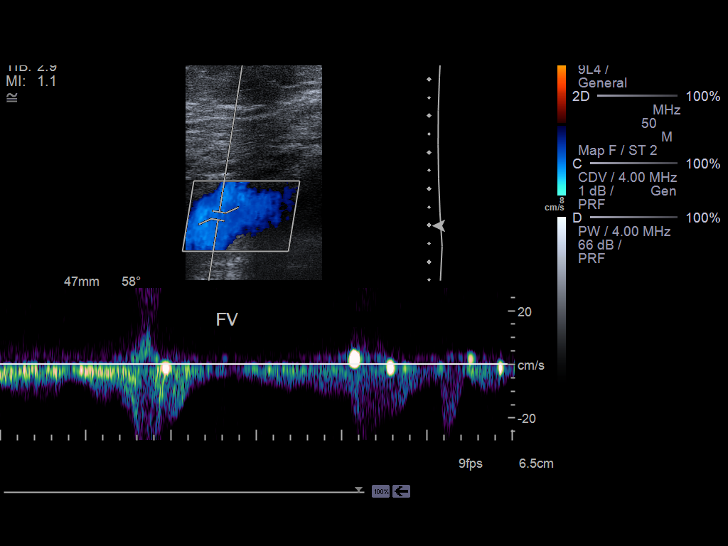
[im 20/29]
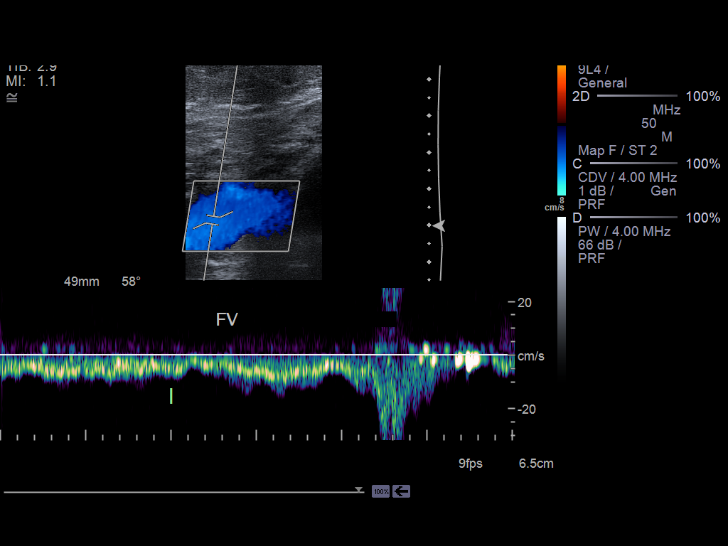
[im 22/29]
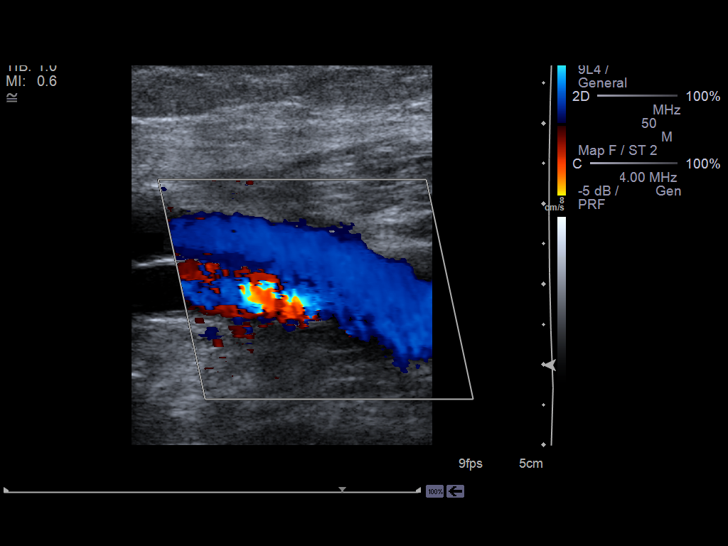
[im 24/29]
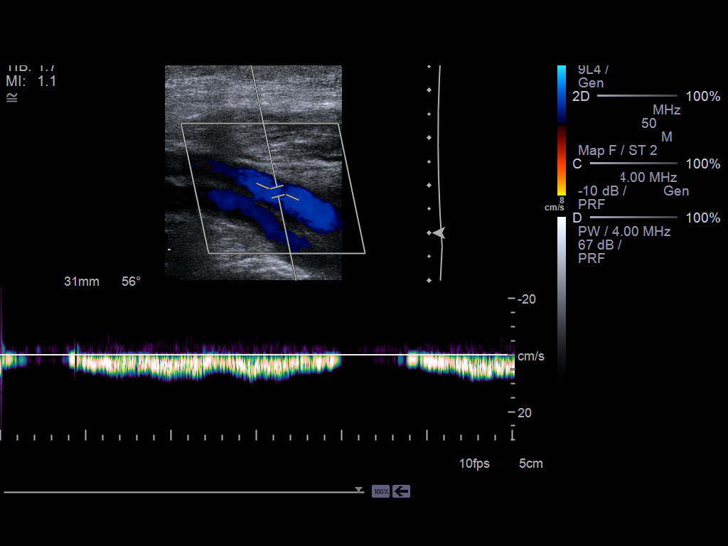
[im 26/29]
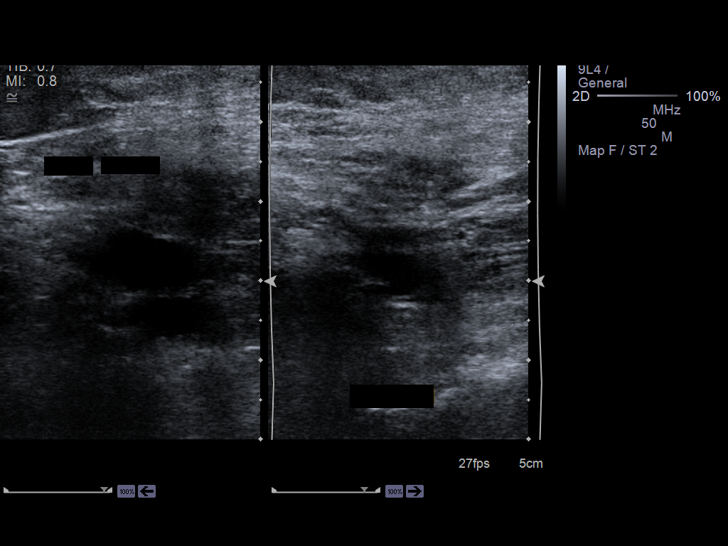
[im 29/29]
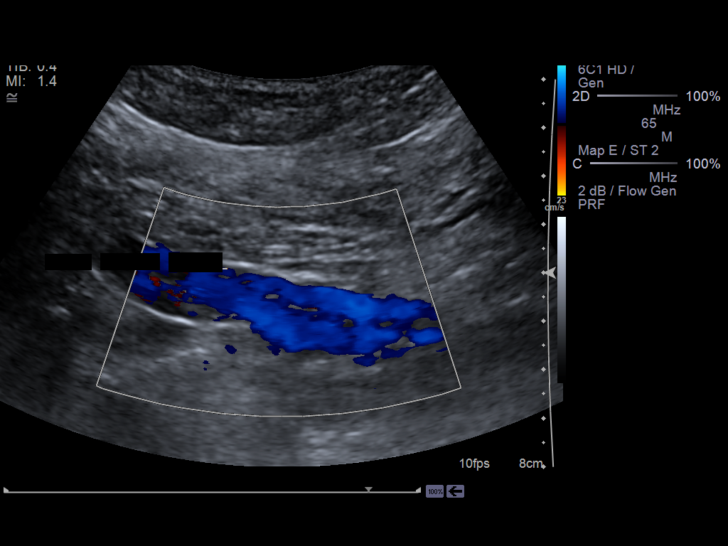

[14 of 24 positions shown; findings below may reference images not displayed]

PROCEDURE:     US  - US DOPPLER LOW EXTR LEFT  - November 15, 2012  [DATE]

RESULT:     The deep venous system of the left lower extremity was
interrogated with grayscale and color flow Doppler techniques.

The left common femoral, superficial femoral, and popliteal veins are
normally compressible. The waveform patterns are normal and the color flow
images are normal. The response to the augmentation and Valsalva maneuvers
is normal.
IMPRESSION: There is no evidence of thrombus within the left femoral or
popliteal veins.

A preliminary report was sent to the [HOSPITAL] the conclusion
of the study.

[REDACTED]

## 2014-02-17 ENCOUNTER — Other Ambulatory Visit: Payer: Self-pay | Admitting: Family Medicine

## 2014-02-24 ENCOUNTER — Telehealth: Payer: Self-pay | Admitting: Family Medicine

## 2014-02-24 DIAGNOSIS — E538 Deficiency of other specified B group vitamins: Secondary | ICD-10-CM

## 2014-02-24 DIAGNOSIS — I10 Essential (primary) hypertension: Secondary | ICD-10-CM

## 2014-02-24 DIAGNOSIS — E785 Hyperlipidemia, unspecified: Secondary | ICD-10-CM

## 2014-02-24 NOTE — Telephone Encounter (Signed)
Message copied by Abner Greenspan on Sat Feb 24, 2014 11:52 AM ------      Message from: Ellamae Sia      Created: Fri Feb 16, 2014 12:37 PM      Regarding: Lab orders for Monday 4.6.15       Patient is scheduled for CPX labs, please order future labs, Thanks , Terri       ------

## 2014-02-26 ENCOUNTER — Other Ambulatory Visit: Payer: Medicare Other

## 2014-02-27 ENCOUNTER — Other Ambulatory Visit (INDEPENDENT_AMBULATORY_CARE_PROVIDER_SITE_OTHER): Payer: Medicare Other

## 2014-02-27 DIAGNOSIS — E538 Deficiency of other specified B group vitamins: Secondary | ICD-10-CM

## 2014-02-27 DIAGNOSIS — I10 Essential (primary) hypertension: Secondary | ICD-10-CM | POA: Diagnosis not present

## 2014-02-27 DIAGNOSIS — E785 Hyperlipidemia, unspecified: Secondary | ICD-10-CM

## 2014-02-27 LAB — LIPID PANEL
Cholesterol: 227 mg/dL — ABNORMAL HIGH (ref 0–200)
HDL: 76.4 mg/dL (ref >39.00)
LDL Cholesterol: 135 mg/dL — ABNORMAL HIGH (ref 0–99)
Total CHOL/HDL Ratio: 3
Triglycerides: 77 mg/dL (ref 0.0–149.0)
VLDL: 15.4 mg/dL (ref 0.0–40.0)

## 2014-02-27 LAB — VITAMIN B12: Vitamin B-12: 576 pg/mL (ref 211–911)

## 2014-02-27 LAB — COMPREHENSIVE METABOLIC PANEL
ALT: 20 U/L (ref 0–35)
AST: 21 U/L (ref 0–37)
Albumin: 3.9 g/dL (ref 3.5–5.2)
Alkaline Phosphatase: 48 U/L (ref 39–117)
BUN: 16 mg/dL (ref 6–23)
CO2: 29 mEq/L (ref 19–32)
Calcium: 9.8 mg/dL (ref 8.4–10.5)
Chloride: 105 mEq/L (ref 96–112)
Creatinine, Ser: 0.8 mg/dL (ref 0.4–1.2)
GFR: 77.27 mL/min (ref >60.00)
Glucose, Bld: 90 mg/dL (ref 70–99)
Potassium: 4.5 mEq/L (ref 3.5–5.1)
Sodium: 141 mEq/L (ref 135–145)
Total Bilirubin: 0.6 mg/dL (ref 0.3–1.2)
Total Protein: 6.7 g/dL (ref 6.0–8.3)

## 2014-02-27 LAB — CBC WITH DIFFERENTIAL/PLATELET
Basophils Absolute: 0 10*3/uL (ref 0.0–0.1)
Basophils Relative: 0.8 % (ref 0.0–3.0)
Eosinophils Absolute: 0.2 10*3/uL (ref 0.0–0.7)
Eosinophils Relative: 3.1 % (ref 0.0–5.0)
HCT: 40.1 % (ref 36.0–46.0)
Hemoglobin: 13.4 g/dL (ref 12.0–15.0)
Lymphocytes Relative: 40.9 % (ref 12.0–46.0)
Lymphs Abs: 2.5 10*3/uL (ref 0.7–4.0)
MCHC: 33.3 g/dL (ref 30.0–36.0)
MCV: 91.7 fl (ref 78.0–100.0)
Monocytes Absolute: 0.6 10*3/uL (ref 0.1–1.0)
Monocytes Relative: 9.3 % (ref 3.0–12.0)
Neutro Abs: 2.8 10*3/uL (ref 1.4–7.7)
Neutrophils Relative %: 45.9 % (ref 43.0–77.0)
Platelets: 295 10*3/uL (ref 150.0–400.0)
RBC: 4.37 Mil/uL (ref 3.87–5.11)
RDW: 14.9 % — ABNORMAL HIGH (ref 11.5–14.6)
WBC: 6.1 10*3/uL (ref 4.5–10.5)

## 2014-02-27 LAB — TSH: TSH: 1.41 u[IU]/mL (ref 0.35–5.50)

## 2014-03-05 ENCOUNTER — Encounter: Payer: Self-pay | Admitting: Family Medicine

## 2014-03-05 ENCOUNTER — Ambulatory Visit (INDEPENDENT_AMBULATORY_CARE_PROVIDER_SITE_OTHER): Payer: Medicare Other | Admitting: Family Medicine

## 2014-03-05 VITALS — BP 134/70 | HR 63 | Temp 97.8°F | Ht 64.25 in | Wt 209.5 lb

## 2014-03-05 DIAGNOSIS — Z0000 Encounter for general adult medical examination without abnormal findings: Secondary | ICD-10-CM | POA: Diagnosis not present

## 2014-03-05 DIAGNOSIS — E538 Deficiency of other specified B group vitamins: Secondary | ICD-10-CM

## 2014-03-05 DIAGNOSIS — K589 Irritable bowel syndrome without diarrhea: Secondary | ICD-10-CM

## 2014-03-05 DIAGNOSIS — E785 Hyperlipidemia, unspecified: Secondary | ICD-10-CM

## 2014-03-05 DIAGNOSIS — I10 Essential (primary) hypertension: Secondary | ICD-10-CM

## 2014-03-05 MED ORDER — IRBESARTAN 150 MG PO TABS: ORAL_TABLET | ORAL | Status: DC

## 2014-03-05 MED ORDER — ATORVASTATIN CALCIUM 10 MG PO TABS: ORAL_TABLET | ORAL | Status: DC

## 2014-03-05 NOTE — Assessment & Plan Note (Signed)
Level normal on current supplementation

## 2014-03-05 NOTE — Assessment & Plan Note (Signed)
Reviewed health habits including diet and exercise and skin cancer prevention Reviewed appropriate screening tests for age  Also reviewed health mt list, fam hx and immunization status , as well as social and family history

## 2014-03-05 NOTE — Assessment & Plan Note (Signed)
bp in fair control at this time  BP Readings from Last 1 Encounters:  03/05/14 134/70   No changes needed Disc lifstyle change with low sodium diet and exercise   Labs reviewed

## 2014-03-05 NOTE — Assessment & Plan Note (Signed)
Disc goals for lipids and reasons to control them Rev labs with pt Rev low sat fat diet in detail  Pt can only tol statin 3 d per week

## 2014-03-05 NOTE — Progress Notes (Signed)
Pre visit review using our clinic review tool, if applicable. No additional management support is needed unless otherwise documented below in the visit note. 

## 2014-03-05 NOTE — Patient Instructions (Signed)
For bowel changes- pick up some Align over the counter- this is a probiotic - take it for 1-2 weeks  If symptoms of thrush return- let us know Keep working on healthy habits If you are interested in a shingles/zoster vaccine - call your insurance to check on coverage,( you should not get it within 1 month of other vaccines) , then call us for a prescription  for it to take to a pharmacy that gives the shot , or make a nurse visit to get it here depending on your coverage

## 2014-03-05 NOTE — Progress Notes (Signed)
Subjective:    Patient ID: Tammy Manning, female    DOB: December 21, 1934, 79 y.o.   MRN: [id number]  HPI I have personally reviewed the Medicare Annual Wellness questionnaire and have noted 1. The patient's medical and social history 2. Their use of alcohol, tobacco or illicit drugs 3. Their current medications and supplements 4. The patient's functional ability including ADL's, fall risks, home safety risks and hearing or visual             impairment. 5. Diet and physical activities 6. Evidence for depression or mood disorders  The patients weight, height, BMI have been recorded in the chart and visual acuity is per eye clinic.  I have made referrals, counseling and provided education to the patient based review of the above and I have provided the pt with a written personalized care plan for preventive services.  Doing well overall   Had pharangitis this season- she had to go on 2 rounds of pcn and prednisone - had thrush immediately  Was given nystain mouthwash  Her GI system seems off since then - bowels   Doing better mood wise- issue with her sister is much better now    See scanned forms.  Routine anticipatory guidance given to patient.  See health maintenance. Colon cancer screening had colonosc 4/13 - 5 year recall Breast cancer screening 7/14 - she plans to do that in the summer Self breast exam-no lumps  Flu vaccine 9/14  Tetanus vaccine 4/14  Pneumovax 9/09 Zoster vaccine - she is interested - will call ins  Bone density screening 10/09 -normal-no fractures  She takes her ca and D  Advance directive- she has a living will  Cognitive function addressed- see scanned forms- and if abnormal then additional documentation follows. -no problems at all / she finds that ginko biloba   PMH and SH reviewed  Meds, vitals, and allergies reviewed.   ROS: See HPI.  Otherwise negative.    Wt is down 4 lb - with bmi of 35  Good exercise with yard work  Eating smart also    bp is stable today  No cp or palpitations or headaches or edema  No side effects to medicines  BP Readings from Last 3 Encounters:  03/05/14 134/70  11/10/13 118/64  02/28/13 122/60     Hyperlipidemia  On lipitor and diet  Lab Results  Component Value Date   CHOL 227* 02/27/2014   CHOL 202* 02/08/2013   CHOL 224* 09/12/2012   Lab Results  Component Value Date   HDL 76.40 02/27/2014   HDL 66.50 02/08/2013   HDL 60.40 09/12/2012   Lab Results  Component Value Date   LDLCALC 135* 02/27/2014   [place] 95 09/24/2010   LDLCALC 90 08/22/2009   Lab Results  Component Value Date   TRIG 77.0 02/27/2014   TRIG 102.0 02/08/2013   TRIG 151.0* 09/12/2012   Lab Results  Component Value Date   CHOLHDL 3 02/27/2014   CHOLHDL 3 02/08/2013   CHOLHDL 4 09/12/2012   Lab Results  Component Value Date   LDLDIRECT 105.9 02/08/2013   LDLDIRECT 125.0 09/12/2012   LDLDIRECT 119.3 12/31/2011    She takes lipitor 3 times per week-that is as much as she can tolerate HDL is up and LDL also  Eating a smart low fat diet   Patient Active Problem List   Diagnosis Date Noted  . Encounter for Medicare annual wellness exam 02/28/2013  . Bucket-handle tear of lateral meniscus of  left knee as current injury 12/30/2012  . Pre-operative examination 11/29/2012  . Palpitations 11/26/2012  . Episodic atrial fibrillation 11/17/2012  . Left knee pain 09/12/2012  . Left leg pain 08/29/2012  . Heme positive stool 02/05/2012  . Special screening for malignant neoplasms, colon 01/21/2012  . Hearing loss of both ears 01/14/2012  . Adverse drug reaction 03/12/2011  . HEADACHE, CHRONIC 09/24/2010  . LEG PAIN, RIGHT 08/04/2010  . BREAST MASS, LEFT 03/24/2010  . B12 DEFICIENCY 03/17/2010  . STRESS REACTION, ACUTE, WITH EMOTIONAL DISTURBANCE 03/17/2010  . DYSPEPSIA 03/17/2010  . POSTMENOPAUSAL STATUS 08/20/2008  . HYPERTENSION, ESSENTIAL NOS 07/19/2007  . HYPERLIPIDEMIA 06/24/2007  . CONSTIPATION 06/24/2007  . IBS  06/24/2007  . DERMATITIS, ATOPIC 06/24/2007  . SKIN CANCER, HX OF 06/24/2007   Past Medical History  Diagnosis Date  . Cancer     Skin, basal cell on nose and eyelid  . Hyperlipidemia   . Arthritis     Knees  . DJD (degenerative joint disease)     Low back  . Dyspepsia   . B12 deficiency     Mild  . Lactose intolerance   . Mixed incontinence   . Vulvar irritation     from urine pad, uses Triamcinolone oint.  . Hypertension   . GERD (gastroesophageal reflux disease)   . PONV (postoperative nausea and vomiting)   . Bucket-handle tear of lateral meniscus of left knee as current injury 12/30/2012   Past Surgical History  Procedure Laterality Date  . Abdominal hysterectomy  1982    Large fibroids and bleeding  . Bilateral salpingoophorectomy  1986  . Bone graft hip iliac crest      To Left arm  . Appendectomy    . Breast reduction surgery    . Hemorrhoid surgery  2006  . Esophagogastroduodenoscopy      Polyps  . Skin cancer eyelid  2012  . Tonsillectomy    . Dilation and curettage of uterus      miscarragesx2  . Knee arthroscopy with lateral menisectomy Left 12/30/2012    Procedure: KNEE ARTHROSCOPY WITH LATERAL MENISECTOMY;  Surgeon: Johnny Bridge, MD;  Location: [facility];  Service: Orthopedics;  Laterality: Left;  LEFT KNEE SCOPE LATERAL MENISCECTOMY   History  Substance Use Topics  . Smoking status: Former Smoker -- 1.00 packs/day for 12 years    Types: Cigarettes    Quit date: 11/24/1963  . Smokeless tobacco: Never Used  . Alcohol Use: No     Comment: Rare   Family History  Problem Relation Age of Onset  . Cancer Mother     Colon  . Heart disease Father     CAD  . Diabetes Sister   . Diabetes Brother   . Leukemia Brother   . Cancer Paternal Aunt     Breast  . Diabetes Sister    Allergies  Allergen Reactions  . Amlodipine Besylate     REACTION: increases BP  . Atorvastatin     REACTION: myalgia; shaking muscles  . Cefuroxime Axetil      REACTION: reaction not known  . Ciprofloxacin     REACTION: ? reaction  . Crestor [Rosuvastatin Calcium]     Severe myalgias   . Dextromethorphan-Guaifenesin     REACTION: jittery  . Epinephrine     REACTION: "put me in the hospital"; hives all over body  . Ramipril     REACTION: ? reaction  . Sulfamethoxazole-Trimethoprim     REACTION: ?  reaction   Current Outpatient Prescriptions on File Prior to Visit  Medication Sig Dispense Refill  . aspirin 81 MG tablet Takes 2 tablets daily.      . Calcium Carbonate-Vitamin D (CALCIUM 600 + D PO) 1 tablet daily.       . cholecalciferol (VITAMIN D) 1000 UNITS tablet Take 1,000 Units by mouth daily.        . clorazepate (TRANXENE) 7.5 MG tablet Take 1 tablet (7.5 mg total) by mouth daily as needed for anxiety.  30 tablet  0  . Coenzyme Q10 200 MG capsule Take 200 mg by mouth daily.        . cyanocobalamin (,VITAMIN B-12,) 1000 MCG/ML injection Inject 1,000 mcg into the muscle. every six weeks       . fluticasone (FLONASE) 50 MCG/ACT nasal spray SPRAY TWICE IN EACH NOSTRIL EVERY DAY  48 g  1  . NON FORMULARY Ginkabro daily.      [name] Kitchen omeprazole (PRILOSEC) 20 MG capsule as needed.        No current facility-administered medications on file prior to visit.       Review of Systems Review of Systems  Constitutional: Negative for fever, appetite change, fatigue and unexpected weight change.  Eyes: Negative for pain and visual disturbance.  Respiratory: Negative for cough and shortness of breath.   Cardiovascular: Negative for cp or palpitations    Gastrointestinal: Negative for nausea, diarrhea and constipation. pos for bloating since taking anti fungal meds this winter  Genitourinary: Negative for urgency and frequency.  Skin: Negative for pallor or rash   Neurological: Negative for weakness, light-headedness, numbness and headaches.  Hematological: Negative for adenopathy. Does not bruise/bleed easily.  Psychiatric/Behavioral: Negative  for dysphoric mood. The patient is not nervous/anxious.         Objective:   Physical Exam  Constitutional: She appears well-developed and well-nourished. No distress.  obese and well appearing   HENT:  Head: Normocephalic and atraumatic.  Right Ear: External ear normal.  Left Ear: External ear normal.  Mouth/Throat: Oropharynx is clear and moist.  Eyes: Conjunctivae and EOM are normal. Pupils are equal, round, and reactive to light. No scleral icterus.  Neck: Normal range of motion. Neck supple. No JVD present. Carotid bruit is not present. No thyromegaly present.  Cardiovascular: Normal rate, regular rhythm, normal heart sounds and intact distal pulses.  Exam reveals no gallop.   Pulmonary/Chest: Effort normal and breath sounds normal. No respiratory distress. She has no wheezes. She exhibits no tenderness.  Abdominal: Soft. Bowel sounds are normal. She exhibits no distension, no abdominal bruit and no mass. There is no tenderness.  Genitourinary: No breast swelling, tenderness, discharge or bleeding.  Musculoskeletal: Normal range of motion. She exhibits no edema and no tenderness.  Lymphadenopathy:    She has no cervical adenopathy.  Neurological: She is alert. She has normal reflexes. No cranial nerve deficit. She exhibits normal muscle tone. Coordination normal.  Skin: Skin is warm and dry. No rash noted. No erythema. No pallor.  Solar lentigos diffusely   Psychiatric: She has a normal mood and affect.          Assessment & Plan:

## 2014-03-05 NOTE — Assessment & Plan Note (Signed)
Worse bloating since antibiotics and antifungal meds this winter  Adv trial of probiotics otc for 1-2 wk and update utd on colonosc

## 2014-03-06 ENCOUNTER — Telehealth: Payer: Self-pay | Admitting: Family Medicine

## 2014-03-06 NOTE — Telephone Encounter (Signed)
Relevant patient education mailed to patient.  

## 2014-03-15 ENCOUNTER — Ambulatory Visit (INDEPENDENT_AMBULATORY_CARE_PROVIDER_SITE_OTHER): Payer: Medicare Other

## 2014-03-15 DIAGNOSIS — E538 Deficiency of other specified B group vitamins: Secondary | ICD-10-CM

## 2014-03-15 MED ORDER — CYANOCOBALAMIN 1000 MCG/ML IJ SOLN
1000.0000 ug | Freq: Once | INTRAMUSCULAR | Status: AC
  Administered 2014-03-15: 1000 ug via INTRAMUSCULAR

## 2014-03-18 DIAGNOSIS — Z23 Encounter for immunization: Secondary | ICD-10-CM | POA: Diagnosis not present

## 2014-04-25 ENCOUNTER — Other Ambulatory Visit: Payer: Self-pay | Admitting: Gastroenterology

## 2014-04-25 ENCOUNTER — Ambulatory Visit (INDEPENDENT_AMBULATORY_CARE_PROVIDER_SITE_OTHER): Payer: Medicare Other

## 2014-04-25 DIAGNOSIS — R109 Unspecified abdominal pain: Secondary | ICD-10-CM

## 2014-04-25 DIAGNOSIS — E538 Deficiency of other specified B group vitamins: Secondary | ICD-10-CM

## 2014-04-25 DIAGNOSIS — R1011 Right upper quadrant pain: Secondary | ICD-10-CM | POA: Diagnosis not present

## 2014-04-25 MED ORDER — CYANOCOBALAMIN 1000 MCG/ML IJ SOLN
1000.0000 ug | Freq: Once | INTRAMUSCULAR | Status: AC
  Administered 2014-04-25: 1000 ug via INTRAMUSCULAR

## 2014-04-26 ENCOUNTER — Ambulatory Visit
Admission: RE | Admit: 2014-04-26 | Discharge: 2014-04-26 | Disposition: A | Discharge status: Home / Self Care | Payer: Medicare Other | Source: Ambulatory Visit | Attending: Gastroenterology | Admitting: Gastroenterology

## 2014-04-26 DIAGNOSIS — K7689 Other specified diseases of liver: Secondary | ICD-10-CM | POA: Diagnosis not present

## 2014-04-26 DIAGNOSIS — R109 Unspecified abdominal pain: Secondary | ICD-10-CM

## 2014-05-01 ENCOUNTER — Other Ambulatory Visit: Payer: Medicare Other

## 2014-05-04 DIAGNOSIS — Z23 Encounter for immunization: Secondary | ICD-10-CM | POA: Diagnosis not present

## 2014-05-17 ENCOUNTER — Other Ambulatory Visit: Payer: Self-pay | Admitting: Family Medicine

## 2014-05-24 ENCOUNTER — Other Ambulatory Visit: Payer: Self-pay | Admitting: *Deleted

## 2014-05-24 MED ORDER — CLORAZEPATE DIPOTASSIUM 7.5 MG PO TABS: 7.5000 mg | ORAL_TABLET | Freq: Every day | ORAL | Status: DC | PRN

## 2014-05-24 NOTE — Telephone Encounter (Signed)
Last filled 06/23/13, and last ov was a cpx on 03/05/14.

## 2014-05-24 NOTE — Telephone Encounter (Signed)
Medication phoned to pharmacy.  

## 2014-05-24 NOTE — Telephone Encounter (Signed)
Please call in.  Thanks.   

## 2014-06-04 DIAGNOSIS — Z1231 Encounter for screening mammogram for malignant neoplasm of breast: Secondary | ICD-10-CM | POA: Diagnosis not present

## 2014-06-05 ENCOUNTER — Encounter: Payer: Self-pay | Admitting: Family Medicine

## 2014-06-06 ENCOUNTER — Ambulatory Visit (INDEPENDENT_AMBULATORY_CARE_PROVIDER_SITE_OTHER): Payer: Medicare Other

## 2014-06-06 DIAGNOSIS — E538 Deficiency of other specified B group vitamins: Secondary | ICD-10-CM | POA: Diagnosis not present

## 2014-06-06 MED ORDER — CYANOCOBALAMIN 1000 MCG/ML IJ SOLN
1000.0000 ug | Freq: Once | INTRAMUSCULAR | Status: AC
  Administered 2014-06-06: 1000 ug via INTRAMUSCULAR

## 2014-07-20 ENCOUNTER — Ambulatory Visit (INDEPENDENT_AMBULATORY_CARE_PROVIDER_SITE_OTHER): Payer: Medicare Other

## 2014-07-20 DIAGNOSIS — E538 Deficiency of other specified B group vitamins: Secondary | ICD-10-CM | POA: Diagnosis not present

## 2014-07-20 MED ORDER — CYANOCOBALAMIN 1000 MCG/ML IJ SOLN
1000.0000 ug | Freq: Once | INTRAMUSCULAR | Status: AC
  Administered 2014-07-20: 1000 ug via INTRAMUSCULAR

## 2014-07-31 DIAGNOSIS — Z23 Encounter for immunization: Secondary | ICD-10-CM | POA: Diagnosis not present

## 2014-08-15 ENCOUNTER — Ambulatory Visit (INDEPENDENT_AMBULATORY_CARE_PROVIDER_SITE_OTHER): Payer: Medicare Other | Admitting: Family Medicine

## 2014-08-15 ENCOUNTER — Encounter: Payer: Self-pay | Admitting: Family Medicine

## 2014-08-15 VITALS — BP 122/62 | HR 86 | Temp 100.8°F | Ht 64.25 in | Wt 214.5 lb

## 2014-08-15 DIAGNOSIS — J208 Acute bronchitis due to other specified organisms: Secondary | ICD-10-CM

## 2014-08-15 DIAGNOSIS — R509 Fever, unspecified: Secondary | ICD-10-CM

## 2014-08-15 DIAGNOSIS — J209 Acute bronchitis, unspecified: Secondary | ICD-10-CM

## 2014-08-15 LAB — POCT INFLUENZA A/B
Influenza A, POC: NEGATIVE
Influenza B, POC: NEGATIVE

## 2014-08-15 MED ORDER — PROMETHAZINE HCL 12.5 MG PO TABS: 12.5000 mg | ORAL_TABLET | Freq: Three times a day (TID) | ORAL | Status: DC | PRN

## 2014-08-15 MED ORDER — AZITHROMYCIN 250 MG PO TABS: ORAL_TABLET | ORAL | Status: DC

## 2014-08-15 NOTE — Patient Instructions (Signed)
Drink lots of fluids and get rest  Use phenergan for nausea as needed -this will sedate so use caution  Take the zithromax as directed  For cough -you can try mucinex DM over the counter

## 2014-08-15 NOTE — Assessment & Plan Note (Signed)
With rhonchi and prod cough and fever and nausea  Cover with zithromax  Disc symptomatic care - see instructions on AVS - mucinex for cough Phenergan 12.5 for nausea if needed Enc fluids and rest  Update if not starting to improve in a week or if worsening

## 2014-08-15 NOTE — Progress Notes (Signed)
Subjective:    Patient ID: Tammy Manning, female    DOB: [birth year], 79 y.o.   MRN: [id number]  HPI Here with uri symptoms  Sore throat began sat and Sunday - then drip and cold symptoms  Temp of 100.8 now  Chills  Chest and back ache from coughing    Used vics vapor rub last night  Cough is a little productive  Makes her nauseated   Nasal discharge is yellow to clear  Some pain above her eyes    Results for orders placed in visit on 08/15/14  POCT INFLUENZA A/B      Result Value Ref Range   Influenza A, POC Negative     Influenza B, POC Negative       Patient Active Problem List   Diagnosis Date Noted  . Encounter for Medicare annual wellness exam 02/28/2013  . Bucket-handle tear of lateral meniscus of left knee as current injury 12/30/2012  . Palpitations 11/26/2012  . Episodic atrial fibrillation 11/17/2012  . Special screening for malignant neoplasms, colon 01/21/2012  . Hearing loss of both ears 01/14/2012  . Adverse drug reaction 03/12/2011  . HEADACHE, CHRONIC 09/24/2010  . B12 DEFICIENCY 03/17/2010  . DYSPEPSIA 03/17/2010  . POSTMENOPAUSAL STATUS 08/20/2008  . HYPERTENSION, ESSENTIAL NOS 07/19/2007  . HYPERLIPIDEMIA 06/24/2007  . CONSTIPATION 06/24/2007  . IBS 06/24/2007  . DERMATITIS, ATOPIC 06/24/2007  . SKIN CANCER, HX OF 06/24/2007   Past Medical History  Diagnosis Date  . Cancer     Skin, basal cell on nose and eyelid  . Hyperlipidemia   . Arthritis     Knees  . DJD (degenerative joint disease)     Low back  . Dyspepsia   . B12 deficiency     Mild  . Lactose intolerance   . Mixed incontinence   . Vulvar irritation     from urine pad, uses Triamcinolone oint.  . Hypertension   . GERD (gastroesophageal reflux disease)   . PONV (postoperative nausea and vomiting)   . Bucket-handle tear of lateral meniscus of left knee as current injury 12/30/2012   Past Surgical History  Procedure Laterality Date  . Abdominal hysterectomy  1982   Large fibroids and bleeding  . Bilateral salpingoophorectomy  1986  . Bone graft hip iliac crest      To Left arm  . Appendectomy    . Breast reduction surgery    . Hemorrhoid surgery  2006  . Esophagogastroduodenoscopy      Polyps  . Skin cancer eyelid  2012  . Tonsillectomy    . Dilation and curettage of uterus      miscarragesx2  . Knee arthroscopy with lateral menisectomy Left 12/30/2012    Procedure: KNEE ARTHROSCOPY WITH LATERAL MENISECTOMY;  Surgeon: Johnny Bridge, MD;  Location: [place] [facility];  Service: Orthopedics;  Laterality: Left;  LEFT KNEE SCOPE LATERAL MENISCECTOMY   History  Substance Use Topics  . Smoking status: Former Smoker -- 1.00 packs/day for 12 years    Types: Cigarettes    Quit date: 11/24/1963  . Smokeless tobacco: Never Used  . Alcohol Use: No     Comment: Rare   Family History  Problem Relation Age of Onset  . Cancer Mother     Colon  . Heart disease Father     CAD  . Diabetes Sister   . Diabetes Brother   . Leukemia Brother   . Cancer Paternal Aunt     Breast  .  Diabetes Sister    Allergies  Allergen Reactions  . Amlodipine Besylate     REACTION: increases BP  . Atorvastatin     REACTION: myalgia; shaking muscles  . Cefuroxime Axetil     REACTION: reaction not known  . Ciprofloxacin     REACTION: ? reaction  . Crestor [Rosuvastatin Calcium]     Severe myalgias   . Dextromethorphan-Guaifenesin     REACTION: jittery  . Epinephrine     REACTION: "put me in the hospital"; hives all over body  . Ramipril     REACTION: ? reaction  . Sulfamethoxazole-Trimethoprim     REACTION: ? reaction   Current Outpatient Prescriptions on File Prior to Visit  Medication Sig Dispense Refill  . aspirin 81 MG tablet Takes 2 tablets daily.      [name] Kitchen atorvastatin (LIPITOR) 10 MG tablet TAKE 1 TABLET BY MOUTH EVERY MONDAY, WEDNESDAY AND FRIDAY  36 tablet  3  . Calcium Carbonate-Vitamin D (CALCIUM 600 + D PO) 1 tablet daily.       .  cholecalciferol (VITAMIN D) 1000 UNITS tablet Take 1,000 Units by mouth daily.        . clorazepate (TRANXENE) 7.5 MG tablet Take 1 tablet (7.5 mg total) by mouth daily as needed for anxiety.  30 tablet  0  . Coenzyme Q10 200 MG capsule Take 200 mg by mouth daily.        . cyanocobalamin (,VITAMIN B-12,) 1000 MCG/ML injection Inject 1,000 mcg into the muscle. every six weeks       . fluticasone (FLONASE) 50 MCG/ACT nasal spray SPRAY TWICE IN EACH NOSTRIL EVERY DAY  48 g  1  . irbesartan (AVAPRO) 150 MG tablet TAKE 1 TABLET BY MOUTH EVERY DAY  90 tablet  3  . NON FORMULARY Ginkabro daily.      [name] Kitchen omeprazole (PRILOSEC) 20 MG capsule as needed.        No current facility-administered medications on file prior to visit.       Was exp to great grand kids who were sick   Flu screen is negative today    Review of Systems Review of Systems  Constitutional: pos for fatigue/malaise/fever and dec appetite .  Eyes: Negative for pain and visual disturbance.  ENT pos for congestion and sinus pressure and improving ST Respiratory: Negative for wheeze and shortness of breath.   Cardiovascular: Negative for cp or palpitations    Gastrointestinal: Negative for , diarrhea and constipation.  Genitourinary: Negative for urgency and frequency.  Skin: Negative for pallor or rash   Neurological: Negative for weakness, light-headedness, numbness and headaches.  Hematological: Negative for adenopathy. Does not bruise/bleed easily.  Psychiatric/Behavioral: Negative for dysphoric mood. The patient is not nervous/anxious.         Objective:   Physical Exam  Constitutional: She appears well-developed and well-nourished. No distress.  obese and well appearing   HENT:  Head: Normocephalic and atraumatic.  Right Ear: External ear normal.  Left Ear: External ear normal.  Mouth/Throat: Oropharynx is clear and moist. No oropharyngeal exudate.  Nares are injected and congested   No sinus tenderness    Throat-clear drip noted     Eyes: Conjunctivae and EOM are normal. Pupils are equal, round, and reactive to light. Right eye exhibits no discharge. Left eye exhibits no discharge.  Neck: Normal range of motion. Neck supple.  Cardiovascular: Normal rate, regular rhythm and normal heart sounds.   Pulmonary/Chest: Effort normal and breath sounds normal. No  respiratory distress. She has no wheezes. She has no rales.  Harsh bs with scattered rhonchi rattley cough  Abdominal: Soft. Bowel sounds are normal. There is no tenderness.  Lymphadenopathy:    She has no cervical adenopathy.  Neurological: She is alert.  Skin: Skin is warm and dry. No rash noted. No pallor.  Psychiatric: She has a normal mood and affect.          Assessment & Plan:   Problem List Items Addressed This Visit     Respiratory   Acute bronchitis     With rhonchi and prod cough and fever and nausea  Cover with zithromax  Disc symptomatic care - see instructions on AVS - mucinex for cough Phenergan 12.5 for nausea if needed Enc fluids and rest  Update if not starting to improve in a week or if worsening        Other Visit Diagnoses   Fever, unspecified fever cause    -  Primary    Relevant Orders       POCT Influenza A/B (Completed)

## 2014-08-15 NOTE — Progress Notes (Signed)
Pre visit review using our clinic review tool, if applicable. No additional management support is needed unless otherwise documented below in the visit note. 

## 2014-08-31 ENCOUNTER — Ambulatory Visit (INDEPENDENT_AMBULATORY_CARE_PROVIDER_SITE_OTHER): Payer: Medicare Other

## 2014-08-31 DIAGNOSIS — E538 Deficiency of other specified B group vitamins: Secondary | ICD-10-CM

## 2014-08-31 MED ORDER — CYANOCOBALAMIN 1000 MCG/ML IJ SOLN
1000.0000 ug | Freq: Once | INTRAMUSCULAR | Status: AC
  Administered 2014-08-31: 1000 ug via INTRAMUSCULAR

## 2014-10-12 ENCOUNTER — Ambulatory Visit (INDEPENDENT_AMBULATORY_CARE_PROVIDER_SITE_OTHER): Payer: Medicare Other | Admitting: *Deleted

## 2014-10-12 DIAGNOSIS — E538 Deficiency of other specified B group vitamins: Secondary | ICD-10-CM | POA: Diagnosis not present

## 2014-10-12 MED ORDER — CYANOCOBALAMIN 1000 MCG/ML IJ SOLN
1000.0000 ug | Freq: Once | INTRAMUSCULAR | Status: AC
  Administered 2014-10-12: 1000 ug via INTRAMUSCULAR

## 2014-11-22 ENCOUNTER — Telehealth: Payer: Self-pay | Admitting: Family Medicine

## 2014-11-22 ENCOUNTER — Other Ambulatory Visit: Payer: Self-pay | Admitting: Family Medicine

## 2014-11-22 MED ORDER — CLORAZEPATE DIPOTASSIUM 7.5 MG PO TABS: 7.5000 mg | ORAL_TABLET | Freq: Every day | ORAL | Status: DC | PRN

## 2014-11-22 MED ORDER — CLOBETASOL PROPIONATE 0.05 % EX CREA: 1.0000 "application " | TOPICAL_CREAM | Freq: Two times a day (BID) | CUTANEOUS | Status: DC

## 2014-11-22 NOTE — Telephone Encounter (Signed)
Clorazepate called in to pharmacy as instructed.

## 2014-11-22 NOTE — Telephone Encounter (Signed)
Please call in the chlorazepate Thanks

## 2014-11-27 ENCOUNTER — Ambulatory Visit (INDEPENDENT_AMBULATORY_CARE_PROVIDER_SITE_OTHER): Payer: Medicare Other | Admitting: Family Medicine

## 2014-11-27 ENCOUNTER — Encounter: Payer: Self-pay | Admitting: Family Medicine

## 2014-11-27 VITALS — BP 128/62 | HR 73 | Temp 98.6°F | Ht 64.25 in | Wt 210.5 lb

## 2014-11-27 DIAGNOSIS — J209 Acute bronchitis, unspecified: Secondary | ICD-10-CM | POA: Diagnosis not present

## 2014-11-27 DIAGNOSIS — E538 Deficiency of other specified B group vitamins: Secondary | ICD-10-CM

## 2014-11-27 MED ORDER — AZITHROMYCIN 250 MG PO TABS: ORAL_TABLET | ORAL | Status: DC

## 2014-11-27 MED ORDER — CYANOCOBALAMIN 1000 MCG/ML IJ SOLN
1000.0000 ug | Freq: Once | INTRAMUSCULAR | Status: AC
  Administered 2014-11-27: 1000 ug via INTRAMUSCULAR

## 2014-11-27 NOTE — Progress Notes (Signed)
Pre visit review using our clinic review tool, if applicable. No additional management support is needed unless otherwise documented below in the visit note. 

## 2014-11-27 NOTE — Progress Notes (Signed)
Subjective:    Patient ID: Tammy Manning, female    DOB: 02-14-1935, 80 y.o.   MRN: [id number]  HPI Here with symptoms of uri  Her husband had it first   Started 12/31  Taking tussionex - that helps her sleep  Post nasal drip / not a lot of nasal congestion  Throat is raw from coughing  Cough mostly dry  Voice comes and goes - quite hoarse  02 sat  Drinking lots of fluids and eating toast and soup  Does not know if she is wheezing - does not hear it   Needs her B12 shot   Patient Active Problem List   Diagnosis Date Noted  . Acute bronchitis 08/15/2014  . Encounter for Medicare annual wellness exam 02/28/2013  . Bucket-handle tear of lateral meniscus of left knee as current injury 12/30/2012  . Palpitations 11/26/2012  . Episodic atrial fibrillation 11/17/2012  . Special screening for malignant neoplasms, colon 01/21/2012  . Hearing loss of both ears 01/14/2012  . Adverse drug reaction 03/12/2011  . HEADACHE, CHRONIC 09/24/2010  . B12 DEFICIENCY 03/17/2010  . DYSPEPSIA 03/17/2010  . POSTMENOPAUSAL STATUS 08/20/2008  . HYPERTENSION, ESSENTIAL NOS 07/19/2007  . HYPERLIPIDEMIA 06/24/2007  . CONSTIPATION 06/24/2007  . IBS 06/24/2007  . DERMATITIS, ATOPIC 06/24/2007  . SKIN CANCER, HX OF 06/24/2007   Past Medical History  Diagnosis Date  . Cancer     Skin, basal cell on nose and eyelid  . Hyperlipidemia   . Arthritis     Knees  . DJD (degenerative joint disease)     Low back  . Dyspepsia   . B12 deficiency     Mild  . Lactose intolerance   . Mixed incontinence   . Vulvar irritation     from urine pad, uses Triamcinolone oint.  . Hypertension   . GERD (gastroesophageal reflux disease)   . PONV (postoperative nausea and vomiting)   . Bucket-handle tear of lateral meniscus of left knee as current injury 12/30/2012   Past Surgical History  Procedure Laterality Date  . Abdominal hysterectomy  1982    Large fibroids and bleeding  . Bilateral  salpingoophorectomy  1986  . Bone graft hip iliac crest      To Left arm  . Appendectomy    . Breast reduction surgery    . Hemorrhoid surgery  2006  . Esophagogastroduodenoscopy      Polyps  . Skin cancer eyelid  2012  . Tonsillectomy    . Dilation and curettage of uterus      miscarragesx2  . Knee arthroscopy with lateral menisectomy Left 12/30/2012    Procedure: KNEE ARTHROSCOPY WITH LATERAL MENISECTOMY;  Surgeon: Johnny Bridge, MD;  Location: [facility];  Service: Orthopedics;  Laterality: Left;  LEFT KNEE SCOPE LATERAL MENISCECTOMY   History  Substance Use Topics  . Smoking status: Former Smoker -- 1.00 packs/day for 12 years    Types: Cigarettes    Quit date: 11/24/1963  . Smokeless tobacco: Never Used  . Alcohol Use: No     Comment: Rare   Family History  Problem Relation Age of Onset  . Cancer Mother     Colon  . Heart disease Father     CAD  . Diabetes Sister   . Diabetes Brother   . Leukemia Brother   . Cancer Paternal Aunt     Breast  . Diabetes Sister    Allergies  Allergen Reactions  . Amlodipine Besylate  REACTION: increases BP  . Atorvastatin     REACTION: myalgia; shaking muscles  . Cefuroxime Axetil     REACTION: reaction not known  . Ciprofloxacin     REACTION: ? reaction  . Crestor [Rosuvastatin Calcium]     Severe myalgias   . Dextromethorphan-Guaifenesin     REACTION: jittery  . Epinephrine     REACTION: "put me in the hospital"; hives all over body  . Ramipril     REACTION: ? reaction  . Sulfamethoxazole-Trimethoprim     REACTION: ? reaction   Current Outpatient Prescriptions on File Prior to Visit  Medication Sig Dispense Refill  . aspirin 81 MG tablet Takes 2 tablets daily.    [name] Kitchen atorvastatin (LIPITOR) 10 MG tablet TAKE 1 TABLET BY MOUTH EVERY MONDAY, WEDNESDAY AND FRIDAY 36 tablet 3  . clobetasol cream (TEMOVATE) 3.66 % Apply 1 application topically 2 (two) times daily. sparingly 30 g 0  . clorazepate (TRANXENE)  7.5 MG tablet Take 1 tablet (7.5 mg total) by mouth daily as needed for anxiety. 30 tablet 0  . fluticasone (FLONASE) 50 MCG/ACT nasal spray SPRAY TWICE IN EACH NOSTRIL EVERY DAY 48 g 1  . irbesartan (AVAPRO) 150 MG tablet TAKE 1 TABLET BY MOUTH EVERY DAY 90 tablet 3  . omeprazole (PRILOSEC) 20 MG capsule as needed.     . Calcium Carbonate-Vitamin D (CALCIUM 600 + D PO) 1 tablet daily.     . cholecalciferol (VITAMIN D) 1000 UNITS tablet Take 1,000 Units by mouth daily.      . Coenzyme Q10 200 MG capsule Take 200 mg by mouth daily.      . cyanocobalamin (,VITAMIN B-12,) 1000 MCG/ML injection Inject 1,000 mcg into the muscle. every six weeks     . NON FORMULARY Ginkabro daily.     No current facility-administered medications on file prior to visit.      Review of Systems Review of Systems  Constitutional: Negative for fever, appetite change,  and unexpected weight change.  ENt pos for congestion and rhinorrhea w/o sinus tenderness pos for hoarseness Eyes: Negative for pain and visual disturbance.  Respiratory: Negative for shortness of breath.   Cardiovascular: Negative for cp or palpitations    Gastrointestinal: Negative for nausea, diarrhea and constipation.  Genitourinary: Negative for urgency and frequency.  Skin: Negative for pallor or rash   Neurological: Negative for weakness, light-headedness, numbness and headaches.  Hematological: Negative for adenopathy. Does not bruise/bleed easily.  Psychiatric/Behavioral: Negative for dysphoric mood. The patient is not nervous/anxious.         Objective:   Physical Exam  Constitutional: She appears well-developed and well-nourished. No distress.  HENT:  Head: Normocephalic and atraumatic.  Right Ear: External ear normal.  Left Ear: External ear normal.  Mouth/Throat: Oropharynx is clear and moist. No oropharyngeal exudate.  Nares are injected and congested  No sinus tenderness Clear rhinorrhea and post nasal drip   Eyes:  Conjunctivae and EOM are normal. Pupils are equal, round, and reactive to light. Right eye exhibits no discharge. Left eye exhibits no discharge.  Neck: Normal range of motion. Neck supple.  Cardiovascular: Normal rate and regular rhythm.   Pulmonary/Chest: Effort normal and breath sounds normal. No respiratory distress. She has no wheezes. She has no rales.  Few isolated rhonchi and upper airway sounds No rales   Musculoskeletal: She exhibits no edema.  Lymphadenopathy:    She has no cervical adenopathy.  Neurological: She is alert. She has normal reflexes.  Skin:  Skin is warm and dry. No rash noted. No erythema. No pallor.  Psychiatric: She has a normal mood and affect.          Assessment & Plan:   Problem List Items Addressed This Visit      Respiratory   Acute bronchitis - Primary    Suspect viral with laryngitis - caught it from her husband  Given zpak px to hold-will fill if symptoms worsen tussionex-has at home if needed - does not think she does inst mucinex may be helpful  Fluids and rest   Update if not starting to improve in a week or if worsening        Digestive   B12 deficiency    B12 shot today    Relevant Medications      cyanocobalamin ((VITAMIN B-12)) injection 1,000 mcg (Completed)

## 2014-11-27 NOTE — Patient Instructions (Signed)
You have bronchitis Drink lots of fluids and rest  Chest sounds clear at this time  Fill px for zpak if you get worse or if not improving more in several days  tussionex (at home) only if needed mucinex is ok over the counter if needed   B12 shot today    Update if not starting to improve in a week or if worsening

## 2014-11-27 NOTE — Assessment & Plan Note (Signed)
Suspect viral with laryngitis - caught it from her husband  Given zpak px to hold-will fill if symptoms worsen tussionex-has at home if needed - does not think she does inst mucinex may be helpful  Fluids and rest   Update if not starting to improve in a week or if worsening

## 2014-11-27 NOTE — Assessment & Plan Note (Signed)
B12 shot today. 

## 2014-11-28 ENCOUNTER — Ambulatory Visit: Payer: Medicare Other

## 2014-11-29 ENCOUNTER — Ambulatory Visit: Payer: Medicare Other

## 2015-02-08 ENCOUNTER — Ambulatory Visit (INDEPENDENT_AMBULATORY_CARE_PROVIDER_SITE_OTHER): Payer: Medicare Other

## 2015-02-08 DIAGNOSIS — E538 Deficiency of other specified B group vitamins: Secondary | ICD-10-CM

## 2015-02-08 MED ORDER — CYANOCOBALAMIN 1000 MCG/ML IJ SOLN
1000.0000 ug | Freq: Once | INTRAMUSCULAR | Status: AC
  Administered 2015-02-08: 1000 ug via INTRAMUSCULAR

## 2015-02-26 ENCOUNTER — Telehealth: Payer: Self-pay | Admitting: Family Medicine

## 2015-02-26 DIAGNOSIS — E785 Hyperlipidemia, unspecified: Secondary | ICD-10-CM

## 2015-02-26 DIAGNOSIS — I10 Essential (primary) hypertension: Secondary | ICD-10-CM

## 2015-02-26 DIAGNOSIS — E538 Deficiency of other specified B group vitamins: Secondary | ICD-10-CM

## 2015-02-26 NOTE — Telephone Encounter (Signed)
-----   Message from Ellamae Sia sent at 02/25/2015  3:07 PM EDT ----- Regarding: Lab orders for Wednesday, 4.6.16 Patient is scheduled for CPX labs, please order future labs, Thanks , Karna Christmas

## 2015-02-27 ENCOUNTER — Other Ambulatory Visit (INDEPENDENT_AMBULATORY_CARE_PROVIDER_SITE_OTHER): Payer: Medicare Other

## 2015-02-27 DIAGNOSIS — I10 Essential (primary) hypertension: Secondary | ICD-10-CM

## 2015-02-27 DIAGNOSIS — E785 Hyperlipidemia, unspecified: Secondary | ICD-10-CM

## 2015-02-27 DIAGNOSIS — E538 Deficiency of other specified B group vitamins: Secondary | ICD-10-CM

## 2015-02-27 LAB — COMPREHENSIVE METABOLIC PANEL
ALT: 12 U/L (ref 0–35)
AST: 15 U/L (ref 0–37)
Albumin: 3.9 g/dL (ref 3.5–5.2)
Alkaline Phosphatase: 59 U/L (ref 39–117)
BUN: 18 mg/dL (ref 6–23)
CO2: 28 mEq/L (ref 19–32)
Calcium: 9.6 mg/dL (ref 8.4–10.5)
Chloride: 106 mEq/L (ref 96–112)
Creatinine, Ser: 0.82 mg/dL (ref 0.40–1.20)
GFR: 71.67 mL/min (ref >60.00)
Glucose, Bld: 86 mg/dL (ref 70–99)
Potassium: 4.4 mEq/L (ref 3.5–5.1)
Sodium: 139 mEq/L (ref 135–145)
Total Bilirubin: 0.5 mg/dL (ref 0.2–1.2)
Total Protein: 6.7 g/dL (ref 6.0–8.3)

## 2015-02-27 LAB — CBC WITH DIFFERENTIAL/PLATELET
Basophils Absolute: 0 10*3/uL (ref 0.0–0.1)
Basophils Relative: 0.7 % (ref 0.0–3.0)
Eosinophils Absolute: 0.2 10*3/uL (ref 0.0–0.7)
Eosinophils Relative: 3.1 % (ref 0.0–5.0)
HCT: 38.8 % (ref 36.0–46.0)
Hemoglobin: 13.1 g/dL (ref 12.0–15.0)
Lymphocytes Relative: 40.9 % (ref 12.0–46.0)
Lymphs Abs: 2 10*3/uL (ref 0.7–4.0)
MCHC: 33.7 g/dL (ref 30.0–36.0)
MCV: 89.5 fl (ref 78.0–100.0)
Monocytes Absolute: 0.4 10*3/uL (ref 0.1–1.0)
Monocytes Relative: 8.9 % (ref 3.0–12.0)
Neutro Abs: 2.3 10*3/uL (ref 1.4–7.7)
Neutrophils Relative %: 46.4 % (ref 43.0–77.0)
Platelets: 293 10*3/uL (ref 150.0–400.0)
RBC: 4.33 Mil/uL (ref 3.87–5.11)
RDW: 14.5 % (ref 11.5–15.5)
WBC: 5 10*3/uL (ref 4.0–10.5)

## 2015-02-27 LAB — LIPID PANEL
Cholesterol: 211 mg/dL — ABNORMAL HIGH (ref 0–200)
HDL: 70.3 mg/dL (ref >39.00)
LDL Cholesterol: 124 mg/dL — ABNORMAL HIGH (ref 0–99)
NonHDL: 140.7
Total CHOL/HDL Ratio: 3
Triglycerides: 83 mg/dL (ref 0.0–149.0)
VLDL: 16.6 mg/dL (ref 0.0–40.0)

## 2015-02-27 LAB — VITAMIN B12: Vitamin B-12: 397 pg/mL (ref 211–911)

## 2015-02-27 LAB — TSH: TSH: 1.68 u[IU]/mL (ref 0.35–4.50)

## 2015-03-01 DIAGNOSIS — H2513 Age-related nuclear cataract, bilateral: Secondary | ICD-10-CM | POA: Diagnosis not present

## 2015-03-02 ENCOUNTER — Other Ambulatory Visit: Payer: Self-pay | Admitting: Family Medicine

## 2015-03-06 ENCOUNTER — Encounter: Payer: Self-pay | Admitting: Family Medicine

## 2015-03-06 ENCOUNTER — Ambulatory Visit (INDEPENDENT_AMBULATORY_CARE_PROVIDER_SITE_OTHER): Payer: Medicare Other | Admitting: Family Medicine

## 2015-03-06 VITALS — BP 128/78 | HR 63 | Temp 97.7°F | Ht 64.25 in | Wt 209.1 lb

## 2015-03-06 DIAGNOSIS — I10 Essential (primary) hypertension: Secondary | ICD-10-CM

## 2015-03-06 DIAGNOSIS — E785 Hyperlipidemia, unspecified: Secondary | ICD-10-CM

## 2015-03-06 DIAGNOSIS — Z0000 Encounter for general adult medical examination without abnormal findings: Secondary | ICD-10-CM | POA: Diagnosis not present

## 2015-03-06 DIAGNOSIS — E538 Deficiency of other specified B group vitamins: Secondary | ICD-10-CM

## 2015-03-06 MED ORDER — IRBESARTAN 150 MG PO TABS: ORAL_TABLET | ORAL | Status: DC

## 2015-03-06 MED ORDER — CLOBETASOL PROPIONATE 0.05 % EX OINT: 1.0000 "application " | TOPICAL_OINTMENT | Freq: Two times a day (BID) | CUTANEOUS | Status: DC

## 2015-03-06 MED ORDER — OMEPRAZOLE 20 MG PO CPDR: 20.0000 mg | DELAYED_RELEASE_CAPSULE | Freq: Every day | ORAL | Status: DC | PRN

## 2015-03-06 NOTE — Assessment & Plan Note (Signed)
bp in fair control at this time  BP Readings from Last 1 Encounters:  03/06/15 128/78   No changes needed Disc lifstyle change with low sodium diet and exercise  Labs rev  Refilled avapro

## 2015-03-06 NOTE — Assessment & Plan Note (Signed)
Doing ok on oral supplementation Lab Results  Component Value Date   VITAMINB12 397 02/27/2015

## 2015-03-06 NOTE — Patient Instructions (Signed)
Work on healthy habits -diet and exercise  Keep walking  Take care of yourself

## 2015-03-06 NOTE — Progress Notes (Signed)
Pre visit review using our clinic review tool, if applicable. No additional management support is needed unless otherwise documented below in the visit note. 

## 2015-03-06 NOTE — Assessment & Plan Note (Signed)
Disc goals for lipids and reasons to control them Rev labs with pt Rev low sat fat diet in detail Fair control with statin and diet

## 2015-03-06 NOTE — Progress Notes (Signed)
Subjective:    Patient ID: Tammy Manning, female    DOB: 1935/01/21, 80 y.o.   MRN: [id number]  HPI Here for annual medicare wellness visit as well as chronic/acute medical problems   I have personally reviewed the Medicare Annual Wellness questionnaire and have noted 1. The patient's medical and social history 2. Their use of alcohol, tobacco or illicit drugs 3. Their current medications and supplements 4. The patient's functional ability including ADL's, fall risks, home safety risks and hearing or visual             impairment. 5. Diet and physical activities 6. Evidence for depression or mood disorders  The patients weight, height, BMI have been recorded in the chart and visual acuity is per eye clinic.  I have made referrals, counseling and provided education to the patient based review of the above and I have provided the pt with a written personalized care plan for preventive services.  See scanned forms.  Routine anticipatory guidance given to patient.  See health maintenance. Colon cancer screening 4/13 - 5 year recall (fam hx and polyps)  Breast cancer screening 7/15 nl  Self breast exam no lumps Just had eye exam - watching cataracts  Flu vaccine 10/15 Tetanus vaccine 4/14  Pneumovax 9/09 , had prevnar in sept  Zoster vaccine - has not had / ins does not cover - perhaps later  dexa 11/12 -normal , no broken bones , she takes her ca with vit D  (found xtra vit D bothered her stomach)  Advance directive - has a living will and poa  Cognitive function addressed- see scanned forms- and if abnormal then additional documentation follows. - ok overall - has noticed some small changes - with names , nothing concerning   PMH and SH reviewed  Meds, vitals, and allergies reviewed.   ROS: See HPI.  Otherwise negative.    Some stress and changes  Had to put her dog sleep - made her depressed - coming out of grief now  [facility] her other dog several times per day  Moved to a  condo - getting used to it  Husband has had stress problems   Wt is down 1 lb with bmi of 35  Getting back to better eating - avoids fried foods for the most part  Exercise - walking at least twice daily   bp is stable today  No cp or palpitations or headaches or edema  No side effects to medicines  BP Readings from Last 3 Encounters:  03/06/15 128/78  11/27/14 128/62  08/15/14 122/62      Hx of B12 def Lab Results  Component Value Date   VITAMINB12 397 02/27/2015    Hyperlipidemia lipitor and diet Lab Results  Component Value Date   CHOL 211* 02/27/2015   CHOL 227* 02/27/2014   CHOL 202* 02/08/2013   Lab Results  Component Value Date   HDL 70.30 02/27/2015   HDL 76.40 02/27/2014   HDL 66.50 02/08/2013   Lab Results  Component Value Date   LDLCALC 124* 02/27/2015   LDLCALC 135* 02/27/2014   [place] 95 09/24/2010   Lab Results  Component Value Date   TRIG 83.0 02/27/2015   TRIG 77.0 02/27/2014   TRIG 102.0 02/08/2013   Lab Results  Component Value Date   CHOLHDL 3 02/27/2015   CHOLHDL 3 02/27/2014   CHOLHDL 3 02/08/2013   Lab Results  Component Value Date   LDLDIRECT 105.9 02/08/2013   LDLDIRECT 125.0 09/12/2012  LDLDIRECT 119.3 12/31/2011   will watch her diet more  Overall stable     Chemistry      Component Value Date/Time   NA 139 02/27/2015 0953   K 4.4 02/27/2015 0953   CL 106 02/27/2015 0953   CO2 28 02/27/2015 0953   BUN 18 02/27/2015 0953   CREATININE 0.82 02/27/2015 0953      Component Value Date/Time   CALCIUM 9.6 02/27/2015 0953   ALKPHOS 59 02/27/2015 0953   AST 15 02/27/2015 0953   ALT 12 02/27/2015 0953   BILITOT 0.5 02/27/2015 0953     Lab Results  Component Value Date   WBC 5.0 02/27/2015   HGB 13.1 02/27/2015   HCT 38.8 02/27/2015   MCV 89.5 02/27/2015   PLT 293.0 02/27/2015   Lab Results  Component Value Date   TSH 1.68 02/27/2015     Patient Active Problem List   Diagnosis Date Noted  . Acute bronchitis  08/15/2014  . Encounter for Medicare annual wellness exam 02/28/2013  . Bucket-handle tear of lateral meniscus of left knee as current injury 12/30/2012  . Palpitations 11/26/2012  . Episodic atrial fibrillation 11/17/2012  . Special screening for malignant neoplasms, colon 01/21/2012  . Hearing loss of both ears 01/14/2012  . Adverse drug reaction 03/12/2011  . HEADACHE, CHRONIC 09/24/2010  . B12 deficiency 03/17/2010  . DYSPEPSIA 03/17/2010  . POSTMENOPAUSAL STATUS 08/20/2008  . Essential hypertension 07/19/2007  . Hyperlipidemia 06/24/2007  . CONSTIPATION 06/24/2007  . IBS 06/24/2007  . DERMATITIS, ATOPIC 06/24/2007  . SKIN CANCER, HX OF 06/24/2007   Past Medical History  Diagnosis Date  . Cancer     Skin, basal cell on nose and eyelid  . Hyperlipidemia   . Arthritis     Knees  . DJD (degenerative joint disease)     Low back  . Dyspepsia   . B12 deficiency     Mild  . Lactose intolerance   . Mixed incontinence   . Vulvar irritation     from urine pad, uses Triamcinolone oint.  . Hypertension   . GERD (gastroesophageal reflux disease)   . PONV (postoperative nausea and vomiting)   . Bucket-handle tear of lateral meniscus of left knee as current injury 12/30/2012   Past Surgical History  Procedure Laterality Date  . Abdominal hysterectomy  1982    Large fibroids and bleeding  . Bilateral salpingoophorectomy  1986  . Bone graft hip iliac crest      To Left arm  . Appendectomy    . Breast reduction surgery    . Hemorrhoid surgery  2006  . Esophagogastroduodenoscopy      Polyps  . Skin cancer eyelid  2012  . Tonsillectomy    . Dilation and curettage of uterus      miscarragesx2  . Knee arthroscopy with lateral menisectomy Left 12/30/2012    Procedure: KNEE ARTHROSCOPY WITH LATERAL MENISECTOMY;  Surgeon: Johnny Bridge, MD;  Location: [place];  Service: Orthopedics;  Laterality: Left;  LEFT KNEE SCOPE LATERAL MENISCECTOMY   History  Substance  Use Topics  . Smoking status: Former Smoker -- 1.00 packs/day for 12 years    Types: Cigarettes    Quit date: 11/24/1963  . Smokeless tobacco: Never Used  . Alcohol Use: No     Comment: Rare   Family History  Problem Relation Age of Onset  . Cancer Mother     Colon  . Heart disease Father  CAD  . Diabetes Sister   . Diabetes Brother   . Leukemia Brother   . Cancer Paternal Aunt     Breast  . Diabetes Sister    Allergies  Allergen Reactions  . Amlodipine Besylate     REACTION: increases BP  . Atorvastatin     REACTION: myalgia; shaking muscles  . Cefuroxime Axetil     REACTION: reaction not known  . Ciprofloxacin     REACTION: ? reaction  . Co Q 10 [Coenzyme Q10]     Stomach upset  . Crestor [Rosuvastatin Calcium]     Severe myalgias   . Dextromethorphan-Guaifenesin     REACTION: jittery  . Epinephrine     REACTION: "put me in the hospital"; hives all over body  . Ramipril     REACTION: ? reaction  . Sulfamethoxazole-Trimethoprim     REACTION: ? reaction  . Vitamin D Analogs     Stomach upset   Current Outpatient Prescriptions on File Prior to Visit  Medication Sig Dispense Refill  . aspirin 81 MG tablet Takes 2 tablets daily.    [name] Kitchen atorvastatin (LIPITOR) 10 MG tablet TAKE 1 TABLET BY MOUTH EVERY MONDAY, WEDNESDAY, AND FRIDAY. 108 tablet 3  . Calcium Carbonate-Vitamin D (CALCIUM 600 + D PO) 1 tablet daily.     . clorazepate (TRANXENE) 7.5 MG tablet Take 1 tablet (7.5 mg total) by mouth daily as needed for anxiety. 30 tablet 0  . fluticasone (FLONASE) 50 MCG/ACT nasal spray SPRAY TWICE IN EACH NOSTRIL EVERY DAY 48 g 1  . NON FORMULARY Ginkabro daily.     No current facility-administered medications on file prior to visit.    Review of Systems Review of Systems  Constitutional: Negative for fever, appetite change, fatigue and unexpected weight change.  Eyes: Negative for pain and visual disturbance.  Respiratory: Negative for cough and shortness of breath.    Cardiovascular: Negative for cp or palpitations    Gastrointestinal: Negative for nausea, diarrhea and constipation.  Genitourinary: Negative for urgency and frequency.  Skin: Negative for pallor or rash   Neurological: Negative for weakness, light-headedness, numbness and headaches.  Hematological: Negative for adenopathy. Does not bruise/bleed easily.  Psychiatric/Behavioral: Negative for dysphoric mood. The patient is not nervous/anxious.  pos for grief after loss of a pet that is improving        Objective:   Physical Exam  Constitutional: She appears well-developed and well-nourished. No distress.  obese and well appearing   HENT:  Head: Normocephalic and atraumatic.  Right Ear: External ear normal.  Left Ear: External ear normal.  Mouth/Throat: Oropharynx is clear and moist.  Has bilat hearing aides   Eyes: Conjunctivae and EOM are normal. Pupils are equal, round, and reactive to light. No scleral icterus.  Neck: Normal range of motion. Neck supple. No JVD present. Carotid bruit is not present. No thyromegaly present.  Cardiovascular: Normal rate, regular rhythm, normal heart sounds and intact distal pulses.  Exam reveals no gallop.   Pulmonary/Chest: Effort normal and breath sounds normal. No respiratory distress. She has no wheezes. She exhibits no tenderness.  Abdominal: Soft. Bowel sounds are normal. She exhibits no distension, no abdominal bruit and no mass. There is no tenderness.  Genitourinary: No breast swelling, tenderness, discharge or bleeding.  Musculoskeletal: Normal range of motion. She exhibits no edema or tenderness.  Good posture  Lymphadenopathy:    She has no cervical adenopathy.  Neurological: She is alert. She has normal reflexes. No cranial nerve  deficit. She exhibits normal muscle tone. Coordination normal.  No tremor  Skin: Skin is warm and dry. No rash noted. No erythema. No pallor.  Solar lentigos diffusely    Psychiatric: She has a normal mood  and affect.          Assessment & Plan:   Problem List Items Addressed This Visit      Cardiovascular and Mediastinum   Essential hypertension    bp in fair control at this time  BP Readings from Last 1 Encounters:  03/06/15 128/78   No changes needed Disc lifstyle change with low sodium diet and exercise  Labs rev  Refilled avapro      Relevant Medications   irbesartan (AVAPRO) 150 MG tablet     Digestive   B12 deficiency    Doing ok on oral supplementation Lab Results  Component Value Date   [id number] 02/27/2015           Other   Encounter for Medicare annual wellness exam - Primary    Reviewed health habits including diet and exercise and skin cancer prevention Reviewed appropriate screening tests for age  Also reviewed health mt list, fam hx and immunization status , as well as social and family history   See HPI Labs rev  Had prevnar in the fall  Cannot afford zoster vaccine at this time Has Adv dir  No falls  Nl dexa 2012 w/o fx       Hyperlipidemia    Disc goals for lipids and reasons to control them Rev labs with pt Rev low sat fat diet in detail Fair control with statin and diet        Relevant Medications   irbesartan (AVAPRO) 150 MG tablet

## 2015-03-06 NOTE — Assessment & Plan Note (Signed)
Reviewed health habits including diet and exercise and skin cancer prevention Reviewed appropriate screening tests for age  Also reviewed health mt list, fam hx and immunization status , as well as social and family history   See HPI Labs rev  Had prevnar in the fall  Cannot afford zoster vaccine at this time Has Adv dir  No falls  Nl dexa 2012 w/o fx

## 2015-03-28 ENCOUNTER — Ambulatory Visit (INDEPENDENT_AMBULATORY_CARE_PROVIDER_SITE_OTHER): Payer: Medicare Other

## 2015-03-28 DIAGNOSIS — E538 Deficiency of other specified B group vitamins: Secondary | ICD-10-CM | POA: Diagnosis not present

## 2015-03-28 MED ORDER — CYANOCOBALAMIN 1000 MCG/ML IJ SOLN
1000.0000 ug | Freq: Once | INTRAMUSCULAR | Status: AC
  Administered 2015-03-28: 1000 ug via INTRAMUSCULAR

## 2015-05-09 ENCOUNTER — Ambulatory Visit: Payer: Medicare Other

## 2015-05-10 ENCOUNTER — Encounter: Payer: Self-pay | Admitting: Family Medicine

## 2015-05-10 ENCOUNTER — Ambulatory Visit (INDEPENDENT_AMBULATORY_CARE_PROVIDER_SITE_OTHER): Payer: Medicare Other | Admitting: Family Medicine

## 2015-05-10 VITALS — BP 142/70 | HR 84 | Temp 98.4°F | Ht 64.25 in | Wt 212.2 lb

## 2015-05-10 DIAGNOSIS — B9789 Other viral agents as the cause of diseases classified elsewhere: Principal | ICD-10-CM

## 2015-05-10 DIAGNOSIS — E538 Deficiency of other specified B group vitamins: Secondary | ICD-10-CM | POA: Diagnosis not present

## 2015-05-10 DIAGNOSIS — J069 Acute upper respiratory infection, unspecified: Secondary | ICD-10-CM | POA: Insufficient documentation

## 2015-05-10 MED ORDER — AMOXICILLIN-POT CLAVULANATE 875-125 MG PO TABS: 1.0000 | ORAL_TABLET | Freq: Two times a day (BID) | ORAL | Status: DC

## 2015-05-10 MED ORDER — HYDROCOD POLST-CPM POLST ER 10-8 MG/5ML PO SUER: 5.0000 mL | Freq: Two times a day (BID) | ORAL | Status: DC

## 2015-05-10 MED ORDER — CYANOCOBALAMIN 1000 MCG/ML IJ SOLN
1000.0000 ug | Freq: Once | INTRAMUSCULAR | Status: AC
  Administered 2015-05-10: 1000 ug via INTRAMUSCULAR

## 2015-05-10 MED ORDER — CLORAZEPATE DIPOTASSIUM 7.5 MG PO TABS: 7.5000 mg | ORAL_TABLET | Freq: Every day | ORAL | Status: DC | PRN

## 2015-05-10 NOTE — Patient Instructions (Addendum)
You have a viral upper respiratory infection with cough  Nothing can prevent bronchitis -watch for wheezing  Drink lots of fluids and rest  Vics is ok / salt water gargles and nasal spray  mucinex is helpful for congestion  Tylenol if fever   If no improvement or getting worse at the 7-10 day mark - then take the antibiotic   Update if not starting to improve in a week or if worsening

## 2015-05-10 NOTE — Progress Notes (Signed)
Pre visit review using our clinic review tool, if applicable. No additional management support is needed unless otherwise documented below in the visit note. 

## 2015-05-10 NOTE — Progress Notes (Signed)
Subjective:    Patient ID: Tammy Manning, female    DOB: 11-Aug-1935, 80 y.o.   MRN: [id number]  HPI Here with uri symptoms  Started on tues afternoon  A little bit of scratchy/sore throat  Aches all over  Don't know what her temp has been Beginning to cough - had some tussionex left  Head hurts over her eyes - it feels like both a tension and sinus headache   In am - phlegm is colored and green   No wheezing   Also needs a B12 shot    Using vics  Also salt water gargle    In grief - her sister died on Feb 07, 2023  Very difficult week - her family took care of her / mentally and physically demanding  Has to take care of all her things and affairs   Patient Active Problem List   Diagnosis Date Noted  . Viral upper respiratory tract infection with cough 05/10/2015  . Acute bronchitis 08/15/2014  . Encounter for Medicare annual wellness exam 02/28/2013  . Bucket-handle tear of lateral meniscus of left knee as current injury 12/30/2012  . Palpitations 11/26/2012  . Episodic atrial fibrillation 11/17/2012  . Special screening for malignant neoplasms, colon 01/21/2012  . Hearing loss of both ears 01/14/2012  . Adverse drug reaction 03/12/2011  . HEADACHE, CHRONIC 09/24/2010  . B12 deficiency 03/17/2010  . DYSPEPSIA 03/17/2010  . POSTMENOPAUSAL STATUS 08/20/2008  . Essential hypertension 07/19/2007  . Hyperlipidemia 06/24/2007  . CONSTIPATION 06/24/2007  . IBS 06/24/2007  . DERMATITIS, ATOPIC 06/24/2007  . SKIN CANCER, HX OF 06/24/2007   Past Medical History  Diagnosis Date  . Cancer     Skin, basal cell on nose and eyelid  . Hyperlipidemia   . Arthritis     Knees  . DJD (degenerative joint disease)     Low back  . Dyspepsia   . B12 deficiency     Mild  . Lactose intolerance   . Mixed incontinence   . Vulvar irritation     from urine pad, uses Triamcinolone oint.  . Hypertension   . GERD (gastroesophageal reflux disease)   . PONV (postoperative nausea and  vomiting)   . Bucket-handle tear of lateral meniscus of left knee as current injury 12/30/2012   Past Surgical History  Procedure Laterality Date  . Abdominal hysterectomy  1982    Large fibroids and bleeding  . Bilateral salpingoophorectomy  1986  . Bone graft hip iliac crest      To Left arm  . Appendectomy    . Breast reduction surgery    . Hemorrhoid surgery  2006  . Esophagogastroduodenoscopy      Polyps  . Skin cancer eyelid  2012  . Tonsillectomy    . Dilation and curettage of uterus      miscarragesx2  . Knee arthroscopy with lateral menisectomy Left 12/30/2012    Procedure: KNEE ARTHROSCOPY WITH LATERAL MENISECTOMY;  Surgeon: Johnny Bridge, MD;  Location: [place];  Service: Orthopedics;  Laterality: Left;  LEFT KNEE SCOPE LATERAL MENISCECTOMY   History  Substance Use Topics  . Smoking status: Former Smoker -- 1.00 packs/day for 12 years    Types: Cigarettes    Quit date: 11/24/1963  . Smokeless tobacco: Never Used  . Alcohol Use: No     Comment: Rare   Family History  Problem Relation Age of Onset  . Cancer Mother     Colon  . Heart disease Father  CAD  . Diabetes Sister   . Diabetes Brother   . Leukemia Brother   . Cancer Paternal Aunt     Breast  . Diabetes Sister    Allergies  Allergen Reactions  . Amlodipine Besylate     REACTION: increases BP  . Atorvastatin     REACTION: myalgia; shaking muscles  . Cefuroxime Axetil     REACTION: reaction not known  . Ciprofloxacin     REACTION: ? reaction  . Co Q 10 [Coenzyme Q10]     Stomach upset  . Crestor [Rosuvastatin Calcium]     Severe myalgias   . Dextromethorphan-Guaifenesin     REACTION: jittery  . Epinephrine     REACTION: "put me in the hospital"; hives all over body  . Ramipril     REACTION: ? reaction  . Sulfamethoxazole-Trimethoprim     REACTION: ? reaction  . Vitamin D Analogs     Stomach upset   Current Outpatient Prescriptions on File Prior to Visit  Medication  Sig Dispense Refill  . aspirin 81 MG tablet Takes 2 tablets daily.    [name] Kitchen atorvastatin (LIPITOR) 10 MG tablet TAKE 1 TABLET BY MOUTH EVERY MONDAY, WEDNESDAY, AND FRIDAY. 108 tablet 3  . Calcium Carbonate-Vitamin D (CALCIUM 600 + D PO) 1 tablet daily.     . clobetasol ointment (TEMOVATE) 1.66 % Apply 1 application topically 2 (two) times daily. To affected area sparingly 30 g 3  . fluticasone (FLONASE) 50 MCG/ACT nasal spray SPRAY TWICE IN EACH NOSTRIL EVERY DAY 48 g 1  . irbesartan (AVAPRO) 150 MG tablet TAKE 1 TABLET BY MOUTH EVERY DAY 90 tablet 3  . NON FORMULARY Ginkabro daily.    [name] Kitchen omeprazole (PRILOSEC) 20 MG capsule Take 1 capsule (20 mg total) by mouth daily as needed. 30 capsule 5   No current facility-administered medications on file prior to visit.    Review of Systems Review of Systems  Constitutional: Negative for fever, appetite change, and unexpected weight change.  ENT pos for cong and rhinorrhea and ST Eyes: Negative for pain and visual disturbance.  Respiratory: Negative for wheeze and shortness of breath.   Cardiovascular: Negative for cp or palpitations    Gastrointestinal: Negative for nausea, diarrhea and constipation.  Genitourinary: Negative for urgency and frequency.  Skin: Negative for pallor or rash   Neurological: Negative for weakness, light-headedness, numbness and headaches.  Hematological: Negative for adenopathy. Does not bruise/bleed easily.  Psychiatric/Behavioral: Negative for dysphoric mood. The patient is not nervous/anxious.         Objective:   Physical Exam  Constitutional: She appears well-developed and well-nourished. No distress.  overwt and fatigued appearing   HENT:  Head: Normocephalic and atraumatic.  Right Ear: External ear normal.  Left Ear: External ear normal.  Mouth/Throat: Oropharynx is clear and moist.  Nares are injected and congested  No sinus tenderness Clear rhinorrhea and post nasal drip   Eyes: Conjunctivae and EOM are  normal. Pupils are equal, round, and reactive to light. Right eye exhibits no discharge. Left eye exhibits no discharge.  Neck: Normal range of motion. Neck supple.  Cardiovascular: Normal rate and normal heart sounds.   Pulmonary/Chest: Effort normal and breath sounds normal. No respiratory distress. She has no wheezes. She has no rales. She exhibits no tenderness.  Harsh bs No wheeze or rales   Lymphadenopathy:    She has no cervical adenopathy.  Neurological: She is alert.  Skin: Skin is warm and dry. No rash  noted.  Psychiatric: She has a normal mood and affect.          Assessment & Plan:   Problem List Items Addressed This Visit    B12 deficiency   Relevant Medications   cyanocobalamin ((VITAMIN B-12)) injection 1,000 mcg (Completed)   Viral upper respiratory tract infection with cough - Primary    While under stress after loosing sister  Disc symptomatic care - see instructions on AVS Rest and fluids  If worse or not imp in 7 d- will fill augmentin   Refill tussionex for cough    Update if not starting to improve in a week or if worsening

## 2015-05-10 NOTE — Assessment & Plan Note (Signed)
While under stress after loosing sister  Disc symptomatic care - see instructions on AVS Rest and fluids  If worse or not imp in 7 d- will fill augmentin   Refill tussionex for cough    Update if not starting to improve in a week or if worsening

## 2015-05-31 ENCOUNTER — Encounter: Payer: Self-pay | Admitting: Family Medicine

## 2015-05-31 ENCOUNTER — Ambulatory Visit (INDEPENDENT_AMBULATORY_CARE_PROVIDER_SITE_OTHER): Payer: Medicare Other | Admitting: Family Medicine

## 2015-05-31 VITALS — BP 122/82 | HR 81 | Temp 98.6°F | Ht 64.25 in | Wt 208.8 lb

## 2015-05-31 DIAGNOSIS — J020 Streptococcal pharyngitis: Secondary | ICD-10-CM | POA: Diagnosis not present

## 2015-05-31 DIAGNOSIS — J029 Acute pharyngitis, unspecified: Secondary | ICD-10-CM | POA: Diagnosis not present

## 2015-05-31 LAB — POCT RAPID STREP A (OFFICE): Rapid Strep A Screen: POSITIVE — AB

## 2015-05-31 MED ORDER — AMOXICILLIN 500 MG PO CAPS: 500.0000 mg | ORAL_CAPSULE | Freq: Three times a day (TID) | ORAL | Status: DC

## 2015-05-31 NOTE — Progress Notes (Signed)
Pre visit review using our clinic review tool, if applicable. No additional management support is needed unless otherwise documented below in the visit note. 

## 2015-05-31 NOTE — Patient Instructions (Signed)
You have a mildly positive strep test - you have some atypical symptoms and that happens sometimes  Take the amoxicillin as directed Fluids and rest  Update if not starting to improve in a week or if worsening

## 2015-05-31 NOTE — Progress Notes (Signed)
Subjective:    Patient ID: Tammy Manning, female    DOB: June 18, 1935, 80 y.o.   MRN: [id number]  HPI Here with a sore throat   Did get better from her URI in June   This started 1 1/2 weeks ago - noticed body aches  Headaches  Some sweats, ? If chills  No tick bites or rash  Throat is mildly sore - very low in throat - scratchy feeling (not severe)  Did not loose her voice   Rapid strep test is pos ( faintly pos)   No cough or runny nose   Patient Active Problem List   Diagnosis Date Noted  . Viral upper respiratory tract infection with cough 05/10/2015  . Acute bronchitis 08/15/2014  . Encounter for Medicare annual wellness exam 02/28/2013  . Bucket-handle tear of lateral meniscus of left knee as current injury 12/30/2012  . Palpitations 11/26/2012  . Episodic atrial fibrillation 11/17/2012  . Special screening for malignant neoplasms, colon 01/21/2012  . Hearing loss of both ears 01/14/2012  . Adverse drug reaction 03/12/2011  . HEADACHE, CHRONIC 09/24/2010  . B12 deficiency 03/17/2010  . DYSPEPSIA 03/17/2010  . POSTMENOPAUSAL STATUS 08/20/2008  . Essential hypertension 07/19/2007  . Hyperlipidemia 06/24/2007  . CONSTIPATION 06/24/2007  . IBS 06/24/2007  . DERMATITIS, ATOPIC 06/24/2007  . SKIN CANCER, HX OF 06/24/2007   Past Medical History  Diagnosis Date  . Cancer     Skin, basal cell on nose and eyelid  . Hyperlipidemia   . Arthritis     Knees  . DJD (degenerative joint disease)     Low back  . Dyspepsia   . B12 deficiency     Mild  . Lactose intolerance   . Mixed incontinence   . Vulvar irritation     from urine pad, uses Triamcinolone oint.  . Hypertension   . GERD (gastroesophageal reflux disease)   . PONV (postoperative nausea and vomiting)   . Bucket-handle tear of lateral meniscus of left knee as current injury 12/30/2012   Past Surgical History  Procedure Laterality Date  . Abdominal hysterectomy  1982    Large fibroids and bleeding  .  Bilateral salpingoophorectomy  1986  . Bone graft hip iliac crest      To Left arm  . Appendectomy    . Breast reduction surgery    . Hemorrhoid surgery  2006  . Esophagogastroduodenoscopy      Polyps  . Skin cancer eyelid  2012  . Tonsillectomy    . Dilation and curettage of uterus      miscarragesx2  . Knee arthroscopy with lateral menisectomy Left 12/30/2012    Procedure: KNEE ARTHROSCOPY WITH LATERAL MENISECTOMY;  Surgeon: Johnny Bridge, MD;  Location: [place];  Service: Orthopedics;  Laterality: Left;  LEFT KNEE SCOPE LATERAL MENISCECTOMY   History  Substance Use Topics  . Smoking status: Former Smoker -- 1.00 packs/day for 12 years    Types: Cigarettes    Quit date: 11/24/1963  . Smokeless tobacco: Never Used  . Alcohol Use: No     Comment: Rare   Family History  Problem Relation Age of Onset  . Cancer Mother     Colon  . Heart disease Father     CAD  . Diabetes Sister   . Diabetes Brother   . Leukemia Brother   . Cancer Paternal Aunt     Breast  . Diabetes Sister    Allergies  Allergen Reactions  .  Amlodipine Besylate     REACTION: increases BP  . Atorvastatin     REACTION: myalgia; shaking muscles  . Cefuroxime Axetil     REACTION: reaction not known  . Ciprofloxacin     REACTION: ? reaction  . Co Q 10 [Coenzyme Q10]     Stomach upset  . Crestor [Rosuvastatin Calcium]     Severe myalgias   . Dextromethorphan-Guaifenesin     REACTION: jittery  . Epinephrine     REACTION: "put me in the hospital"; hives all over body  . Ramipril     REACTION: ? reaction  . Sulfamethoxazole-Trimethoprim     REACTION: ? reaction  . Vitamin D Analogs     Stomach upset   Current Outpatient Prescriptions on File Prior to Visit  Medication Sig Dispense Refill  . aspirin 81 MG tablet Takes 2 tablets daily.    [name] Kitchen atorvastatin (LIPITOR) 10 MG tablet TAKE 1 TABLET BY MOUTH EVERY MONDAY, WEDNESDAY, AND FRIDAY. 108 tablet 3  . Calcium Carbonate-Vitamin D  (CALCIUM 600 + D PO) 1 tablet daily.     . chlorpheniramine-HYDROcodone (TUSSIONEX PENNKINETIC ER) 10-8 MG/5ML SUER Take 5 mLs by mouth 2 (two) times daily. Careful of sedation 140 mL 0  . clobetasol ointment (TEMOVATE) 8.11 % Apply 1 application topically 2 (two) times daily. To affected area sparingly 30 g 3  . clorazepate (TRANXENE) 7.5 MG tablet Take 1 tablet (7.5 mg total) by mouth daily as needed for anxiety. 30 tablet 0  . fluticasone (FLONASE) 50 MCG/ACT nasal spray SPRAY TWICE IN EACH NOSTRIL EVERY DAY 48 g 1  . irbesartan (AVAPRO) 150 MG tablet TAKE 1 TABLET BY MOUTH EVERY DAY 90 tablet 3  . NON FORMULARY Ginkabro daily.    [name] Kitchen omeprazole (PRILOSEC) 20 MG capsule Take 1 capsule (20 mg total) by mouth daily as needed. 30 capsule 5   No current facility-administered medications on file prior to visit.      Review of Systems Review of Systems  Constitutional: Negative for fever, appetite change, and unexpected weight change. pos for fatigue and malaise and body aches  ENT pos for ST and post nasal drip  Eyes: Negative for pain and visual disturbance.  Respiratory: Negative for cough and shortness of breath.   Cardiovascular: Negative for cp or palpitations    Gastrointestinal: Negative for nausea, diarrhea and constipation.  Genitourinary: Negative for urgency and frequency.  Skin: Negative for pallor or rash   Neurological: Negative for weakness, light-headedness, numbness and pos for  headaches.  Hematological: Negative for adenopathy. Does not bruise/bleed easily.  Psychiatric/Behavioral: Negative for dysphoric mood. The patient is not nervous/anxious.         Objective:   Physical Exam  Constitutional: She appears well-developed and well-nourished. No distress.  HENT:  Head: Normocephalic and atraumatic.  Right Ear: External ear normal.  Left Ear: External ear normal.  Mouth/Throat: Oropharynx is clear and moist. No oropharyngeal exudate.  Nares are clear but boggy Some  clear post nasal drip  Throat is mildly injected without swelling or exudate   Eyes: Conjunctivae and EOM are normal. Pupils are equal, round, and reactive to light. Right eye exhibits no discharge. Left eye exhibits no discharge. No scleral icterus.  Neck: Normal range of motion. Neck supple.  Cardiovascular: Normal rate and regular rhythm.   Pulmonary/Chest: Effort normal and breath sounds normal. No respiratory distress. She has no wheezes. She has no rales.  Musculoskeletal: She exhibits no edema.  Lymphadenopathy:  She has no cervical adenopathy.  Neurological: She is alert. She has normal reflexes.  Skin: Skin is warm and dry. No rash noted. No erythema. No pallor.  Psychiatric: She has a normal mood and affect.          Assessment & Plan:   Problem List Items Addressed This Visit    Strep pharyngitis    With body aches/ intermittent fever and mild ST  Per pt she was around children  Will cover with amoxicillin 500 mg tid Disc symptomatic care - see instructions on AVS Update if not starting to improve in a week or if worsening          Other Visit Diagnoses    Sore throat    -  Primary    Relevant Orders    Rapid Strep A (Completed)

## 2015-06-02 NOTE — Assessment & Plan Note (Signed)
With body aches/ intermittent fever and mild ST  Per pt she was around children  Will cover with amoxicillin 500 mg tid Disc symptomatic care - see instructions on AVS Update if not starting to improve in a week or if worsening

## 2015-06-06 DIAGNOSIS — Z1231 Encounter for screening mammogram for malignant neoplasm of breast: Secondary | ICD-10-CM | POA: Diagnosis not present

## 2015-06-07 ENCOUNTER — Encounter: Payer: Self-pay | Admitting: Family Medicine

## 2015-06-21 ENCOUNTER — Ambulatory Visit (INDEPENDENT_AMBULATORY_CARE_PROVIDER_SITE_OTHER): Payer: Medicare Other

## 2015-06-21 DIAGNOSIS — E538 Deficiency of other specified B group vitamins: Secondary | ICD-10-CM | POA: Diagnosis not present

## 2015-06-21 MED ORDER — CYANOCOBALAMIN 1000 MCG/ML IJ SOLN
1000.0000 ug | Freq: Once | INTRAMUSCULAR | Status: AC
  Administered 2015-06-21: 1000 ug via INTRAMUSCULAR

## 2015-08-06 ENCOUNTER — Ambulatory Visit: Payer: Medicare Other

## 2015-08-07 ENCOUNTER — Ambulatory Visit (INDEPENDENT_AMBULATORY_CARE_PROVIDER_SITE_OTHER): Payer: Medicare Other

## 2015-08-07 DIAGNOSIS — E538 Deficiency of other specified B group vitamins: Secondary | ICD-10-CM

## 2015-08-07 DIAGNOSIS — Z23 Encounter for immunization: Secondary | ICD-10-CM | POA: Diagnosis not present

## 2015-08-07 MED ORDER — CYANOCOBALAMIN 1000 MCG/ML IJ SOLN
1000.0000 ug | Freq: Once | INTRAMUSCULAR | Status: AC
  Administered 2015-08-07: 1000 ug via INTRAMUSCULAR

## 2015-08-30 ENCOUNTER — Emergency Department: Payer: Medicare Other

## 2015-08-30 ENCOUNTER — Encounter: Payer: Self-pay | Admitting: *Deleted

## 2015-08-30 ENCOUNTER — Telehealth: Payer: Self-pay | Admitting: *Deleted

## 2015-08-30 ENCOUNTER — Observation Stay
Admission: EM | Admit: 2015-08-30 | Discharge: 2015-08-31 | Disposition: A | Discharge status: Home / Self Care | Payer: Medicare Other | Attending: Internal Medicine | Admitting: Internal Medicine

## 2015-08-30 DIAGNOSIS — K219 Gastro-esophageal reflux disease without esophagitis: Secondary | ICD-10-CM | POA: Diagnosis not present

## 2015-08-30 DIAGNOSIS — Y93K1 Activity, walking an animal: Secondary | ICD-10-CM | POA: Insufficient documentation

## 2015-08-30 DIAGNOSIS — R002 Palpitations: Secondary | ICD-10-CM | POA: Diagnosis not present

## 2015-08-30 DIAGNOSIS — Z79899 Other long term (current) drug therapy: Secondary | ICD-10-CM | POA: Diagnosis not present

## 2015-08-30 DIAGNOSIS — J209 Acute bronchitis, unspecified: Secondary | ICD-10-CM | POA: Insufficient documentation

## 2015-08-30 DIAGNOSIS — E785 Hyperlipidemia, unspecified: Secondary | ICD-10-CM | POA: Insufficient documentation

## 2015-08-30 DIAGNOSIS — I10 Essential (primary) hypertension: Secondary | ICD-10-CM | POA: Diagnosis present

## 2015-08-30 DIAGNOSIS — R079 Chest pain, unspecified: Secondary | ICD-10-CM | POA: Diagnosis not present

## 2015-08-30 DIAGNOSIS — Z881 Allergy status to other antibiotic agents status: Secondary | ICD-10-CM | POA: Insufficient documentation

## 2015-08-30 DIAGNOSIS — R0789 Other chest pain: Principal | ICD-10-CM | POA: Diagnosis present

## 2015-08-30 DIAGNOSIS — J069 Acute upper respiratory infection, unspecified: Secondary | ICD-10-CM | POA: Insufficient documentation

## 2015-08-30 DIAGNOSIS — M1990 Unspecified osteoarthritis, unspecified site: Secondary | ICD-10-CM | POA: Diagnosis not present

## 2015-08-30 DIAGNOSIS — Z882 Allergy status to sulfonamides status: Secondary | ICD-10-CM | POA: Diagnosis not present

## 2015-08-30 DIAGNOSIS — I4891 Unspecified atrial fibrillation: Secondary | ICD-10-CM | POA: Diagnosis not present

## 2015-08-30 DIAGNOSIS — I700 Atherosclerosis of aorta: Secondary | ICD-10-CM | POA: Insufficient documentation

## 2015-08-30 DIAGNOSIS — Z87891 Personal history of nicotine dependence: Secondary | ICD-10-CM | POA: Diagnosis not present

## 2015-08-30 DIAGNOSIS — M5136 Other intervertebral disc degeneration, lumbar region: Secondary | ICD-10-CM | POA: Insufficient documentation

## 2015-08-30 DIAGNOSIS — Z8249 Family history of ischemic heart disease and other diseases of the circulatory system: Secondary | ICD-10-CM | POA: Insufficient documentation

## 2015-08-30 DIAGNOSIS — Z806 Family history of leukemia: Secondary | ICD-10-CM | POA: Diagnosis not present

## 2015-08-30 DIAGNOSIS — Z803 Family history of malignant neoplasm of breast: Secondary | ICD-10-CM | POA: Diagnosis not present

## 2015-08-30 DIAGNOSIS — Z85828 Personal history of other malignant neoplasm of skin: Secondary | ICD-10-CM | POA: Diagnosis not present

## 2015-08-30 DIAGNOSIS — J020 Streptococcal pharyngitis: Secondary | ICD-10-CM | POA: Insufficient documentation

## 2015-08-30 DIAGNOSIS — N3946 Mixed incontinence: Secondary | ICD-10-CM | POA: Diagnosis not present

## 2015-08-30 DIAGNOSIS — Z800 Family history of malignant neoplasm of digestive organs: Secondary | ICD-10-CM | POA: Insufficient documentation

## 2015-08-30 DIAGNOSIS — E538 Deficiency of other specified B group vitamins: Secondary | ICD-10-CM | POA: Insufficient documentation

## 2015-08-30 DIAGNOSIS — Z7982 Long term (current) use of aspirin: Secondary | ICD-10-CM | POA: Insufficient documentation

## 2015-08-30 DIAGNOSIS — H9193 Unspecified hearing loss, bilateral: Secondary | ICD-10-CM | POA: Insufficient documentation

## 2015-08-30 DIAGNOSIS — Z90710 Acquired absence of both cervix and uterus: Secondary | ICD-10-CM | POA: Diagnosis not present

## 2015-08-30 DIAGNOSIS — Z888 Allergy status to other drugs, medicaments and biological substances status: Secondary | ICD-10-CM | POA: Insufficient documentation

## 2015-08-30 DIAGNOSIS — Z833 Family history of diabetes mellitus: Secondary | ICD-10-CM | POA: Diagnosis not present

## 2015-08-30 DIAGNOSIS — E739 Lactose intolerance, unspecified: Secondary | ICD-10-CM | POA: Diagnosis not present

## 2015-08-30 DIAGNOSIS — R1013 Epigastric pain: Secondary | ICD-10-CM | POA: Insufficient documentation

## 2015-08-30 LAB — TROPONIN I
Troponin I: <0.03 ng/mL (ref <0.031)
Troponin I: <0.03 ng/mL (ref <0.031)
Troponin I: <0.03 ng/mL (ref <0.031)

## 2015-08-30 LAB — BASIC METABOLIC PANEL
Anion gap: 7 (ref 5–15)
BUN: 15 mg/dL (ref 6–20)
CO2: 27 mmol/L (ref 22–32)
Calcium: 9.4 mg/dL (ref 8.9–10.3)
Chloride: 109 mmol/L (ref 101–111)
Creatinine, Ser: 0.75 mg/dL (ref 0.44–1.00)
GFR calc Af Amer: >60 mL/min (ref >60)
GFR calc non Af Amer: >60 mL/min (ref >60)
Glucose, Bld: 97 mg/dL (ref 65–99)
Potassium: 3.7 mmol/L (ref 3.5–5.1)
Sodium: 143 mmol/L (ref 135–145)

## 2015-08-30 LAB — CBC
HCT: 38.1 % (ref 35.0–47.0)
Hemoglobin: 12.6 g/dL (ref 12.0–16.0)
MCH: 29.7 pg (ref 26.0–34.0)
MCHC: 32.9 g/dL (ref 32.0–36.0)
MCV: 90.2 fL (ref 80.0–100.0)
Platelets: 275 10*3/uL (ref 150–440)
RBC: 4.23 MIL/uL (ref 3.80–5.20)
RDW: 14.1 % (ref 11.5–14.5)
WBC: 7.6 10*3/uL (ref 3.6–11.0)

## 2015-08-30 MED ORDER — ASPIRIN EC 81 MG PO TBEC
81.0000 mg | DELAYED_RELEASE_TABLET | Freq: Every day | ORAL | Status: DC
  Administered 2015-08-31: 81 mg via ORAL
  Filled 2015-08-30 (×2): qty 1

## 2015-08-30 MED ORDER — SODIUM CHLORIDE 0.9 % IJ SOLN
3.0000 mL | Freq: Two times a day (BID) | INTRAMUSCULAR | Status: DC
  Administered 2015-08-30 – 2015-08-31 (×2): 3 mL via INTRAVENOUS

## 2015-08-30 MED ORDER — ACETAMINOPHEN 650 MG RE SUPP: 650.0000 mg | Freq: Four times a day (QID) | RECTAL | Status: DC | PRN

## 2015-08-30 MED ORDER — MORPHINE SULFATE (PF) 2 MG/ML IV SOLN: 2.0000 mg | INTRAVENOUS | Status: DC | PRN

## 2015-08-30 MED ORDER — CALCIUM CARBONATE-VITAMIN D 600-400 MG-UNIT PO TABS: 1.0000 | ORAL_TABLET | Freq: Every day | ORAL | Status: DC

## 2015-08-30 MED ORDER — NITROGLYCERIN 2 % TD OINT
1.0000 [in_us] | TOPICAL_OINTMENT | Freq: Once | TRANSDERMAL | Status: AC
  Administered 2015-08-30: 1 [in_us] via TOPICAL
  Filled 2015-08-30: qty 1

## 2015-08-30 MED ORDER — OXYCODONE HCL 5 MG PO TABS: 5.0000 mg | ORAL_TABLET | ORAL | Status: DC | PRN

## 2015-08-30 MED ORDER — CALCIUM CARBONATE-VITAMIN D 500-200 MG-UNIT PO TABS
1.0000 | ORAL_TABLET | Freq: Every day | ORAL | Status: DC
  Administered 2015-08-31: 1 via ORAL
  Filled 2015-08-30: qty 1

## 2015-08-30 MED ORDER — ONDANSETRON HCL 4 MG PO TABS: 4.0000 mg | ORAL_TABLET | Freq: Four times a day (QID) | ORAL | Status: DC | PRN

## 2015-08-30 MED ORDER — CLORAZEPATE DIPOTASSIUM 7.5 MG PO TABS: 7.5000 mg | ORAL_TABLET | Freq: Every day | ORAL | Status: DC | PRN

## 2015-08-30 MED ORDER — ACETAMINOPHEN 325 MG PO TABS: 650.0000 mg | ORAL_TABLET | Freq: Four times a day (QID) | ORAL | Status: DC | PRN

## 2015-08-30 MED ORDER — ONDANSETRON HCL 4 MG/2ML IJ SOLN: 4.0000 mg | Freq: Four times a day (QID) | INTRAMUSCULAR | Status: DC | PRN

## 2015-08-30 MED ORDER — PANTOPRAZOLE SODIUM 40 MG PO TBEC
40.0000 mg | DELAYED_RELEASE_TABLET | Freq: Every day | ORAL | Status: DC
  Administered 2015-08-31: 40 mg via ORAL
  Filled 2015-08-30: qty 1

## 2015-08-30 MED ORDER — FLUTICASONE PROPIONATE 50 MCG/ACT NA SUSP: 2.0000 | Freq: Every day | NASAL | Status: DC | PRN

## 2015-08-30 MED ORDER — ATORVASTATIN CALCIUM 10 MG PO TABS: 10.0000 mg | ORAL_TABLET | ORAL | Status: DC

## 2015-08-30 MED ORDER — IRBESARTAN 150 MG PO TABS
150.0000 mg | ORAL_TABLET | Freq: Every day | ORAL | Status: DC
  Administered 2015-08-31: 150 mg via ORAL
  Filled 2015-08-30 (×2): qty 1

## 2015-08-30 MED ORDER — HEPARIN SODIUM (PORCINE) 5000 UNIT/ML IJ SOLN
5000.0000 [IU] | Freq: Three times a day (TID) | INTRAMUSCULAR | Status: DC
  Administered 2015-08-30 – 2015-08-31 (×2): 5000 [IU] via SUBCUTANEOUS
  Filled 2015-08-30 (×2): qty 1

## 2015-08-30 MED ORDER — HYDRALAZINE HCL 20 MG/ML IJ SOLN: 10.0000 mg | INTRAMUSCULAR | Status: DC | PRN

## 2015-08-30 MED ORDER — NITROGLYCERIN 0.4 MG SL SUBL
0.4000 mg | SUBLINGUAL_TABLET | Freq: Once | SUBLINGUAL | Status: AC
Start: 2015-08-30 — End: 2015-08-30
  Administered 2015-08-30: 0.4 mg via SUBLINGUAL
  Filled 2015-08-30: qty 1

## 2015-08-30 MED ORDER — NITROGLYCERIN 0.4 MG SL SUBL: 0.4000 mg | SUBLINGUAL_TABLET | SUBLINGUAL | Status: DC | PRN

## 2015-08-30 NOTE — ED Provider Notes (Signed)
[facility] Emergency Department Provider Note     Time seen: ----------------------------------------- 5:38 PM on 08/30/2015 -----------------------------------------    I have reviewed the triage vital signs and the nursing notes.   HISTORY  Chief Complaint Chest Pain    HPI Tammy Manning is a 80 y.o. female who presents ER with intermittent chest heaviness for the past few weeks but worse today. Patient has pain left side of her chest and her neck, worse when she is exerting herself. He raised her arm neck and back. Denies history of same, nothing makes it better. Patient does not have any shortness of breath or orthopnea. She denies any fevers chills other complaints   Past Medical History  Diagnosis Date  . Cancer (HCC)     Skin, basal cell on nose and eyelid  . Hyperlipidemia   . Arthritis     Knees  . DJD (degenerative joint disease)     Low back  . Dyspepsia   . B12 deficiency     Mild  . Lactose intolerance   . Mixed incontinence   . Vulvar irritation     from urine pad, uses Triamcinolone oint.  . Hypertension   . GERD (gastroesophageal reflux disease)   . PONV (postoperative nausea and vomiting)   . Bucket-handle tear of lateral meniscus of left knee as current injury 12/30/2012    Patient Active Problem List   Diagnosis Date Noted  . Strep pharyngitis 05/31/2015  . Viral upper respiratory tract infection with cough 05/10/2015  . Acute bronchitis 08/15/2014  . Encounter for Medicare annual wellness exam 02/28/2013  . Bucket-handle tear of lateral meniscus of left knee as current injury 12/30/2012  . Palpitations 11/26/2012  . Episodic atrial fibrillation ([place]) 11/17/2012  . Special screening for malignant neoplasms, colon 01/21/2012  . Hearing loss of both ears 01/14/2012  . Adverse drug reaction 03/12/2011  . HEADACHE, CHRONIC 09/24/2010  . B12 deficiency 03/17/2010  . DYSPEPSIA 03/17/2010  . POSTMENOPAUSAL STATUS  08/20/2008  . Essential hypertension 07/19/2007  . Hyperlipidemia 06/24/2007  . CONSTIPATION 06/24/2007  . IBS 06/24/2007  . DERMATITIS, ATOPIC 06/24/2007  . SKIN CANCER, HX OF 06/24/2007    Past Surgical History  Procedure Laterality Date  . Abdominal hysterectomy  1982    Large fibroids and bleeding  . Bilateral salpingoophorectomy  1986  . Bone graft hip iliac crest      To Left arm  . Appendectomy    . Breast reduction surgery    . Hemorrhoid surgery  2006  . Esophagogastroduodenoscopy      Polyps  . Skin cancer eyelid  2012  . Tonsillectomy    . Dilation and curettage of uterus      miscarragesx2  . Knee arthroscopy with lateral menisectomy Left 12/30/2012    Procedure: KNEE ARTHROSCOPY WITH LATERAL MENISECTOMY;  Surgeon: Johnny Bridge, MD;  Location: [place];  Service: Orthopedics;  Laterality: Left;  LEFT KNEE SCOPE LATERAL MENISCECTOMY    Allergies Amlodipine besylate; Atorvastatin; Cefuroxime axetil; Ciprofloxacin; Co q 10; Crestor; Dextromethorphan-guaifenesin; Epinephrine; Ramipril; Sulfamethoxazole-trimethoprim; and Vitamin d analogs  Social History Social History  Substance Use Topics  . Smoking status: Former Smoker -- 1.00 packs/day for 12 years    Types: Cigarettes    Quit date: 11/24/1963  . Smokeless tobacco: Never Used  . Alcohol Use: No     Comment: Rare    Review of Systems Constitutional: Negative for fever. Eyes: Negative for visual changes. ENT: Negative for sore  throat. Cardiovascular: Positive for chest pain Respiratory: Negative for shortness of breath. Gastrointestinal: Negative for abdominal pain, vomiting and diarrhea. Genitourinary: Negative for dysuria. Musculoskeletal: Negative for back pain. Skin: Negative for rash. Neurological: Negative for headaches, focal weakness or numbness.  10-point ROS otherwise negative.  ____________________________________________   PHYSICAL EXAM:  VITAL SIGNS: ED Triage  Vitals  Enc Vitals Group     BP 08/30/15 1446 193/65 mmHg     Pulse Rate 08/30/15 1446 70     Resp 08/30/15 1446 16     Temp 08/30/15 1446 98 F (36.7 C)     Temp Source 08/30/15 1446 Oral     SpO2 08/30/15 1446 100 %     Weight 08/30/15 1446 210 lb (95.255 kg)     Height 08/30/15 1446 5' 4"  (1.626 m)     Head Cir --      Peak Flow --      Pain Score --      Pain Loc --      Pain Edu? --      Excl. in [place]? --     Constitutional: Alert and oriented. Well appearing and in no distress. Eyes: Conjunctivae are normal. PERRL. Normal extraocular movements. ENT   Head: Normocephalic and atraumatic.   Nose: No congestion/rhinnorhea.   Mouth/Throat: Mucous membranes are moist.   Neck: No stridor. Cardiovascular: Normal rate, regular rhythm. Normal and symmetric distal pulses are present in all extremities. No murmurs, rubs, or gallops. Respiratory: Normal respiratory effort without tachypnea nor retractions. Breath sounds are clear and equal bilaterally. No wheezes/rales/rhonchi. Gastrointestinal: Soft and nontender. No distention. No abdominal bruits.  Musculoskeletal: Nontender with normal range of motion in all extremities. No joint effusions.  No lower extremity tenderness nor edema. Neurologic:  Normal speech and language. No gross focal neurologic deficits are appreciated. Speech is normal. No gait instability. Skin:  Skin is warm, dry and intact. No rash noted. Psychiatric: Mood and affect are normal. Speech and behavior are normal. Patient exhibits appropriate insight and judgment. ____________________________________________  EKG: Interpreted by me. Normal sinus rhythm with a rate of 75 bpm, minimal criteria for LVH, normal PR interval, normal QS with, normal QT interval. No evidence of acute infarction  ____________________________________________  ED COURSE:  Pertinent labs & imaging results that were available during my care of the patient were reviewed by me and  considered in my medical decision making (see chart for details). Check cardiac labs and likely repeat troponin. ____________________________________________    LABS (pertinent positives/negatives)  Labs Reviewed  BASIC METABOLIC PANEL  CBC  TROPONIN I  TROPONIN I    RADIOLOGY  Chest x-ray is normal  ____________________________________________  FINAL ASSESSMENT AND PLAN  Chest pain, hypertension  Plan: Patient with labs and imaging as dictated above. Patient is no acute distress, resembles stable angina however patient has had negative troponin 2 here. I discussed with the cardiologist on call Dr. Ronna Polio who recommends admission for observation. Patient received nitroglycerin to see if it affects her chest pain and to lower her blood pressure.   Earleen Newport, MD   Earleen Newport, MD 08/30/15 [phone]

## 2015-08-30 NOTE — Telephone Encounter (Signed)
S/w pt who states she feels like "something is going on"  She has been experiencing SOB, aching in chest, often radiating to jaw, shoulder, arm, and back. Symptoms have been on and off for a few weeks. Denies sweating, nausea. Advised pt to go to ER now. Pt hoped to see Arida today. Informed her he is not in the office this afternoon and she needs to be seen today. I advised her that ER is where she should go. States she will talk to her husband about it and make decision.

## 2015-08-30 NOTE — ED Notes (Signed)
Pt reports intermittent heaviness over past few weeks, but worse today. Pain to left side of chest and neck, states worse when she is exerting herself. Radiates into left arm, neck, and back.

## 2015-08-30 NOTE — ED Notes (Signed)
Nitro paste removed per Dr. Jimmye Norman

## 2015-08-30 NOTE — H&P (Signed)
[facility] [place] at [facility]   PATIENT NAME: Tammy Manning    MR#:  [id number]  DATE OF BIRTH:  1935-08-26   DATE OF ADMISSION:  08/30/2015  PRIMARY CARE PHYSICIAN: Roxy Manns, MD   REQUESTING/REFERRING PHYSICIAN: Mayford Knife  CHIEF COMPLAINT:   Chief Complaint  Patient presents with  . Chest Pain    HISTORY OF PRESENT ILLNESS:  Tammy Manning  is a 80 y.o. female with a known history of essential hypertension, osteoarthritis presented with chest pain. She describes approximately two-week duration of chest pain intermittent, retrosternal in location pressure/tightness in quality 7/10 intensity radiation to the left in neck, jaw, left arm. All of these episodes have occurred with some sort of activity as in walking her dog up a slight hill. She denies any further symptomatology he symptomatically at this time. She actually called her cardiologist with the above symptoms about presented to Hospital further workup and evaluation.  PAST MEDICAL HISTORY:   Past Medical History  Diagnosis Date  . Cancer (HCC)     Skin, basal cell on nose and eyelid  . Hyperlipidemia   . Arthritis     Knees  . DJD (degenerative joint disease)     Low back  . Dyspepsia   . B12 deficiency     Mild  . Lactose intolerance   . Mixed incontinence   . Vulvar irritation     from urine pad, uses Triamcinolone oint.  . Hypertension   . GERD (gastroesophageal reflux disease)   . PONV (postoperative nausea and vomiting)   . Bucket-handle tear of lateral meniscus of left knee as current injury 12/30/2012    PAST SURGICAL HISTORY:   Past Surgical History  Procedure Laterality Date  . Abdominal hysterectomy  1982    Large fibroids and bleeding  . Bilateral salpingoophorectomy  1986  . Bone graft hip iliac crest      To Left arm  . Appendectomy    . Breast reduction surgery    . Hemorrhoid surgery  2006  . Esophagogastroduodenoscopy      Polyps  . Skin cancer  eyelid  2012  . Tonsillectomy    . Dilation and curettage of uterus      miscarragesx2  . Knee arthroscopy with lateral menisectomy Left 12/30/2012    Procedure: KNEE ARTHROSCOPY WITH LATERAL MENISECTOMY;  Surgeon: Eulas Post, MD;  Location: [facility];  Service: Orthopedics;  Laterality: Left;  LEFT KNEE SCOPE LATERAL MENISCECTOMY    SOCIAL HISTORY:   Social History  Substance Use Topics  . Smoking status: Former Smoker -- 1.00 packs/day for 12 years    Types: Cigarettes    Quit date: 11/24/1963  . Smokeless tobacco: Never Used  . Alcohol Use: No     Comment: Rare    FAMILY HISTORY:   Family History  Problem Relation Age of Onset  . Cancer Mother     Colon  . Heart disease Father     CAD  . Diabetes Sister   . Diabetes Brother   . Leukemia Brother   . Cancer Paternal Aunt     Breast  . Diabetes Sister     DRUG ALLERGIES:   Allergies  Allergen Reactions  . Amlodipine Besylate Other (See Comments)    Reaction:  Increases BP  . Antihistamines, Chlorpheniramine-Type Other (See Comments)    Reaction:  Makes pt hyper   . Atorvastatin Other (See Comments)    Reaction:  Myalgias   .  Cefuroxime Axetil Other (See Comments)    Reaction:  Unknown   . Ciprofloxacin Other (See Comments)    Reaction:  Unknown   . Co Q 10 [Coenzyme Q10] Other (See Comments)    Reaction:  GI upset   . Crestor [Rosuvastatin Calcium] Other (See Comments)    Reaction:  Myalgias   . Dextromethorphan-Guaifenesin Other (See Comments)    Reaction:  Made pt jittery   . Epinephrine Hives  . Ramipril Other (See Comments)    Reaction:  Unknown   . Sulfamethoxazole-Trimethoprim Other (See Comments)    Reaction:  Unknown   . Vitamin D Analogs Other (See Comments)    Reaction:  GI upset     REVIEW OF SYSTEMS:  REVIEW OF SYSTEMS:  CONSTITUTIONAL: Denies fevers, chills, fatigue, weakness.  EYES: Denies blurred vision, double vision, or eye pain.  EARS, NOSE, THROAT: Denies  tinnitus, ear pain, hearing loss.  RESPIRATORY: denies cough, shortness of breath, wheezing  CARDIOVASCULAR: Positive chest pain, denies palpitations, edema.  GASTROINTESTINAL: Denies nausea, vomiting, diarrhea, abdominal pain.  GENITOURINARY: Denies dysuria, hematuria.  ENDOCRINE: Denies nocturia or thyroid problems. HEMATOLOGIC AND LYMPHATIC: Denies easy bruising or bleeding.  SKIN: Denies rash or lesions.  MUSCULOSKELETAL: Denies pain in neck, back, shoulder, knees, hips, or further arthritic symptoms.  NEUROLOGIC: Denies paralysis, paresthesias.  PSYCHIATRIC: Denies anxiety or depressive symptoms. Otherwise full review of systems performed by me is negative.   MEDICATIONS AT HOME:   Prior to Admission medications   Medication Sig Start Date End Date Taking? Authorizing Provider  aspirin EC 81 MG tablet Take 81 mg by mouth daily.   Yes Historical Provider, MD  atorvastatin (LIPITOR) 10 MG tablet Take 10 mg by mouth every Monday, Wednesday, and Friday.   Yes Historical Provider, MD  Calcium Carbonate-Vitamin D (CALCIUM 600+D) 600-400 MG-UNIT tablet Take 1 tablet by mouth daily.   Yes Historical Provider, MD  clobetasol ointment (TEMOVATE) 0.05 % Apply 1 application topically 2 (two) times daily. To affected area sparingly Patient taking differently: Apply 1 application topically 2 (two) times daily as needed (for rash).  03/06/15  Yes [name], MD  clorazepate (TRANXENE) 7.5 MG tablet Take 1 tablet (7.5 mg total) by mouth daily as needed for anxiety. 05/10/15  Yes [name], MD  fluticasone (FLONASE) 50 MCG/ACT nasal spray Place 2 sprays into both nostrils daily as needed for rhinitis.   Yes Historical Provider, MD  irbesartan (AVAPRO) 150 MG tablet Take 150 mg by mouth daily.   Yes Historical Provider, MD  omeprazole (PRILOSEC) 20 MG capsule Take 1 capsule (20 mg total) by mouth daily as needed. Patient taking differently: Take 20 mg by mouth daily as needed (for  indigestion/heartburn).  03/06/15  Yes [name], MD  amoxicillin (AMOXIL) 500 MG capsule Take 1 capsule (500 mg total) by mouth 3 (three) times daily. Patient not taking: Reported on 08/30/2015 05/31/15   [name], MD  chlorpheniramine-HYDROcodone [facility] ER) 10-8 MG/5ML SUER Take 5 mLs by mouth 2 (two) times daily. Careful of sedation Patient not taking: Reported on 08/30/2015 05/10/15   [name], MD      VITAL SIGNS:  Blood pressure 170/61, pulse 64, temperature 98 F (36.7 C), temperature source Oral, resp. rate 19, height 5\' 4"  (1.626 m), weight 210 lb (95.255 kg), SpO2 96 %.  PHYSICAL EXAMINATION:  VITAL SIGNS: Filed Vitals:   08/30/15 2000  BP: 170/61  Pulse: 64  Temp:   Resp: 19  GENERAL:80 y.o.female currently in no acute distress.  HEAD: Normocephalic, atraumatic.  EYES: Pupils equal, round, reactive to light. Extraocular muscles intact. No scleral icterus.  MOUTH: Moist mucosal membrane. Dentition intact. No abscess noted.  EAR, NOSE, THROAT: Clear without exudates. No external lesions.  NECK: Supple. No thyromegaly. No nodules. No JVD.  PULMONARY: Clear to ascultation, without wheeze rails or rhonci. No use of accessory muscles, Good respiratory effort. good air entry bilaterally CHEST: Nontender to palpation.  CARDIOVASCULAR: S1 and S2. Regular rate and rhythm. No murmurs, rubs, or gallops. No edema. Pedal pulses 2+ bilaterally.  GASTROINTESTINAL: Soft, nontender, nondistended. No masses. Positive bowel sounds. No hepatosplenomegaly.  MUSCULOSKELETAL: No swelling, clubbing, or edema. Range of motion full in all extremities.  NEUROLOGIC: Cranial nerves II through XII are intact. No gross focal neurological deficits. Sensation intact. Reflexes intact.  SKIN: No ulceration, lesions, rashes, or cyanosis. Skin warm and dry. Turgor intact.  PSYCHIATRIC: Mood, affect within normal limits. The patient is awake, alert and oriented x 3. Insight, judgment  intact.    LABORATORY PANEL:   CBC  Recent Labs Lab 08/30/15 1451  WBC 7.6  HGB 12.6  HCT 38.1  PLT 275   ------------------------------------------------------------------------------------------------------------------  Chemistries   Recent Labs Lab 08/30/15 1451  NA 143  K 3.7  CL 109  CO2 27  GLUCOSE 97  BUN 15  CREATININE 0.75  CALCIUM 9.4   ------------------------------------------------------------------------------------------------------------------  Cardiac Enzymes  Recent Labs Lab 08/30/15 1705  TROPONINI <0.03   ------------------------------------------------------------------------------------------------------------------  RADIOLOGY:  Dg Chest 2 View  08/30/2015   CLINICAL DATA:  LEFT side chest pain for 2 weeks, hypertension  EXAM: CHEST  2 VIEW  COMPARISON:  11/15/2012  FINDINGS: Normal heart size, mediastinal contours, and pulmonary vascularity.  Atherosclerotic calcification aorta.  Minimal central peribronchial thickening.  No pulmonary infiltrate, pleural effusion or pneumothorax.  Bones unremarkable.  IMPRESSION: No acute abnormalities.   Electronically Signed   By: Ulyses Southward M.D.   On: 08/30/2015 15:17    EKG:   Orders placed or performed during the hospital encounter of 08/30/15  . EKG 12-Lead  . EKG 12-Lead  . ED EKG within 10 minutes  . ED EKG within 10 minutes    IMPRESSION AND PLAN:   80 year old Caucasian female history of essential hypertension who is presenting with chest pain.  1. Chest pain, central: Symptoms sound typical of angina, initiate aspirin/statin therapy place on telemetry trend cardiac enzymes 3 will consult cardiology Dr. Pecola Lawless, order stress test with dobutamine 2. Hyperlipidemia unspecified: Lipitor 3. GERD without esophagitis: PPI therapy 4. Essential hypertension: Avapro 5. Venous embolism prophylactic: Heparin subcutaneous     All the records are reviewed and case discussed with ED  provider. Management plans discussed with the patient, family and they are in agreement.  CODE STATUS: Full  TOTAL TIME TAKING CARE OF THIS PATIENT: 35 minutes.    Tammy Manning,  Mardi Mainland.D on 08/30/2015 at 8:39 PM  Between 7am to 6pm - Pager - [phone]  After 6pm: House Pager: - [phone]  [facility]  Office  [phone]  CC: Primary care physician; Roxy Manns, MD

## 2015-08-30 NOTE — Telephone Encounter (Signed)
Pt c/o Shortness Of Breath: STAT if SOB developed within the last 24 hours or pt is noticeably SOB on the phone  1. Are you currently SOB (can you hear that pt is SOB on the phone)? Yes while walking, heaviness in chest and feels like a ache in jaw towards ear and across shoulders to elbow  2. How long have you been experiencing SOB? A week  3. Are you SOB when sitting or when up moving around? Moving  4. Are you currently experiencing any other symptoms? See above

## 2015-08-31 ENCOUNTER — Observation Stay
Admit: 2015-08-31 | Discharge: 2015-08-31 | Disposition: A | Discharge status: Home / Self Care | Payer: Medicare Other | Attending: Internal Medicine | Admitting: Internal Medicine

## 2015-08-31 ENCOUNTER — Observation Stay: Payer: Medicare Other

## 2015-08-31 DIAGNOSIS — I10 Essential (primary) hypertension: Secondary | ICD-10-CM | POA: Diagnosis not present

## 2015-08-31 DIAGNOSIS — R0789 Other chest pain: Secondary | ICD-10-CM | POA: Diagnosis not present

## 2015-08-31 DIAGNOSIS — R079 Chest pain, unspecified: Secondary | ICD-10-CM | POA: Diagnosis not present

## 2015-08-31 DIAGNOSIS — K219 Gastro-esophageal reflux disease without esophagitis: Secondary | ICD-10-CM | POA: Diagnosis not present

## 2015-08-31 DIAGNOSIS — I200 Unstable angina: Secondary | ICD-10-CM | POA: Diagnosis not present

## 2015-08-31 DIAGNOSIS — E785 Hyperlipidemia, unspecified: Secondary | ICD-10-CM

## 2015-08-31 LAB — NM MYOCAR MULTI W/SPECT W/WALL MOTION / EF
LV dias vol: 84 mL
LV sys vol: 28 mL
Peak HR: 104 {beats}/min
Rest HR: 67 {beats}/min
SDS: 0
SRS: 0
SSS: 0

## 2015-08-31 LAB — TROPONIN I: Troponin I: <0.03 ng/mL (ref <0.031)

## 2015-08-31 MED ORDER — REGADENOSON 0.4 MG/5ML IV SOLN
0.4000 mg | Freq: Once | INTRAVENOUS | Status: AC
  Administered 2015-08-31: 0.4 mg via INTRAVENOUS

## 2015-08-31 MED ORDER — ATORVASTATIN CALCIUM 10 MG PO TABS: 10.0000 mg | ORAL_TABLET | Freq: Every day | ORAL | Status: DC

## 2015-08-31 MED ORDER — TECHNETIUM TC 99M SESTAMIBI - CARDIOLITE
13.9000 | Freq: Once | INTRAVENOUS | Status: AC | PRN
  Administered 2015-08-31: 09:00:00 13.9 via INTRAVENOUS

## 2015-08-31 MED ORDER — HYDROCHLOROTHIAZIDE 12.5 MG PO TABS: 12.5000 mg | ORAL_TABLET | Freq: Every day | ORAL | Status: DC

## 2015-08-31 MED ORDER — TECHNETIUM TC 99M SESTAMIBI - CARDIOLITE
32.9000 | Freq: Once | INTRAVENOUS | Status: AC | PRN
  Administered 2015-08-31: 32.9 via INTRAVENOUS

## 2015-08-31 NOTE — Progress Notes (Signed)
Discharge instructions along with follow up appt and home medication list gone over with patient. Patient verbalized that she understood instructions. Two printed rx given to patient. Iv and telemetry removed. No c/o chest pain. No distress noted. Patient to be discharged home on ra, family to transport patient [employer]

## 2015-08-31 NOTE — Discharge Instructions (Signed)
Low sodium and heart healthy diet. Activity as tolerated.

## 2015-08-31 NOTE — Progress Notes (Signed)
Spoke wit dr. Bridgett Larsson to make aware of stress test results. Per md confirm with cardiology that patient is okay to be discharged.

## 2015-08-31 NOTE — Consult Note (Signed)
Primary Physician: Primary Cardiologist:  Fletcher Anon  Last seen 2014   HPI: Patient is a 80 yo with a history of palpitations  Last seen in 2014 who we are asked to see re CP   Pt with history of HTN, OA  Presented yesterday with CP  Pain for 2 wks  Intermitt.  Pressure  She walks the dog daily  No problems except she notices some tightness with walking hill near house  [facility] dog  No problem  When she was at store later in day she develped chest pressure (like couldn't get big breath) and some funny sensation in L arm.  Went home  It eased Called office  They told her tom come in to ER  Here in hospital BP has been high. Had Lexiscan myoview this AM that showed normal perfusion  LVEF normal          Past Medical History  Diagnosis Date  . Cancer (HCC)     Skin, basal cell on nose and eyelid  . Hyperlipidemia   . Arthritis     Knees  . DJD (degenerative joint disease)     Low back  . Dyspepsia   . B12 deficiency     Mild  . Lactose intolerance   . Mixed incontinence   . Vulvar irritation     from urine pad, uses Triamcinolone oint.  . Hypertension   . GERD (gastroesophageal reflux disease)   . PONV (postoperative nausea and vomiting)   . Bucket-handle tear of lateral meniscus of left knee as current injury 12/30/2012    Medications Prior to Admission  Medication Sig Dispense Refill  . aspirin EC 81 MG tablet Take 81 mg by mouth daily.    [name] Kitchen atorvastatin (LIPITOR) 10 MG tablet Take 10 mg by mouth every Monday, Wednesday, and Friday.    . Calcium Carbonate-Vitamin D (CALCIUM 600+D) 600-400 MG-UNIT tablet Take 1 tablet by mouth daily.    . clobetasol ointment (TEMOVATE) 7.68 % Apply 1 application topically 2 (two) times daily. To affected area sparingly (Patient taking differently: Apply 1 application topically 2 (two) times daily as needed (for rash). ) 30 g 3  . clorazepate (TRANXENE) 7.5 MG tablet Take 1 tablet (7.5 mg total) by mouth daily as needed for  anxiety. 30 tablet 0  . fluticasone (FLONASE) 50 MCG/ACT nasal spray Place 2 sprays into both nostrils daily as needed for rhinitis.    [name] Kitchen irbesartan (AVAPRO) 150 MG tablet Take 150 mg by mouth daily.    [name] Kitchen omeprazole (PRILOSEC) 20 MG capsule Take 1 capsule (20 mg total) by mouth daily as needed. (Patient taking differently: Take 20 mg by mouth daily as needed (for indigestion/heartburn). ) 30 capsule 5  . amoxicillin (AMOXIL) 500 MG capsule Take 1 capsule (500 mg total) by mouth 3 (three) times daily. (Patient not taking: Reported on 08/30/2015) 21 capsule 0  . chlorpheniramine-HYDROcodone (TUSSIONEX PENNKINETIC ER) 10-8 MG/5ML SUER Take 5 mLs by mouth 2 (two) times daily. Careful of sedation (Patient not taking: Reported on 08/30/2015) 140 mL 0     . aspirin EC  81 mg Oral Daily  . [START ON 09/02/2015] atorvastatin  10 mg Oral Q M,W,F  . calcium-vitamin D  1 tablet Oral Daily  . heparin  5,000 Units Subcutaneous 3 times per day  . irbesartan  150 mg Oral Daily  . pantoprazole  40 mg Oral Daily  . sodium chloride  3 mL Intravenous Q12H  Infusions:    Allergies  Allergen Reactions  . Amlodipine Besylate Other (See Comments)    Reaction:  Increases BP  . Antihistamines, Chlorpheniramine-Type Other (See Comments)    Reaction:  Makes pt hyper   . Atorvastatin Other (See Comments)    Reaction:  Myalgias   . Cefuroxime Axetil Other (See Comments)    Reaction:  Unknown   . Ciprofloxacin Other (See Comments)    Reaction:  Unknown   . Co Q 10 [Coenzyme Q10] Other (See Comments)    Reaction:  GI upset   . Crestor [Rosuvastatin Calcium] Other (See Comments)    Reaction:  Myalgias   . Dextromethorphan-Guaifenesin Other (See Comments)    Reaction:  Made pt jittery   . Epinephrine Hives  . Ramipril Other (See Comments)    Reaction:  Unknown   . Sulfamethoxazole-Trimethoprim Other (See Comments)    Reaction:  Unknown   . Vitamin D Analogs Other (See Comments)    Reaction:  GI upset      Social History   Social History  . Marital Status: Married    Spouse Name: N/A  . Number of Children: 2  . Years of Education: N/A   Occupational History  . Proofreader    Social History Main Topics  . Smoking status: Former Smoker -- 1.00 packs/day for 12 years    Types: Cigarettes    Quit date: 11/24/1963  . Smokeless tobacco: Never Used  . Alcohol Use: No     Comment: Rare  . Drug Use: No  . Sexual Activity: Not on file   Other Topics Concern  . Not on file   Social History Narrative    Family History  Problem Relation Age of Onset  . Cancer Mother     Colon  . Heart disease Father     CAD  . Diabetes Sister   . Diabetes Brother   . Leukemia Brother   . Cancer Paternal Aunt     Breast  . Diabetes Sister     REVIEW OF SYSTEMS:  All systems reviewed  Negative to the above problem except as noted above.    PHYSICAL EXAM: Filed Vitals:   08/31/15 0621  BP: 153/55  Pulse: 60  Temp: 97.7 F (36.5 C)  Resp: 19    No intake or output data in the 24 hours ending 08/31/15 1021  General:  Well appearing. No respiratory difficulty HEENT: normal Neck: supple. no JVD. Carotids 2+ bilat; no bruits. No lymphadenopathy or thryomegaly appreciated. Cor: PMI nondisplaced. Regular rate & rhythm. No rubs, gallops or murmurs. Lungs: clear Abdomen: soft, nontender, nondistended. No hepatosplenomegaly. No bruits or masses. Good bowel sounds. Extremities: no cyanosis, clubbing, rash, edema Neuro: alert & oriented x 3, cranial nerves grossly intact. moves all 4 extremities w/o difficulty. Affect pleasant.  ECG:  SR 75 bpm  LVH    Results for orders placed or performed during the hospital encounter of 08/30/15 (from the past 24 hour(s))  Basic metabolic panel     Status: None   Collection Time: 08/30/15  2:51 PM  Result Value Ref Range   Sodium 143 135 - 145 mmol/L   Potassium 3.7 3.5 - 5.1 mmol/L   Chloride 109 101 - 111 mmol/L   CO2 27 22 - 32 mmol/L    Glucose, Bld 97 65 - 99 mg/dL   BUN 15 6 - 20 mg/dL   Creatinine, Ser 0.75 0.44 - 1.00 mg/dL   Calcium 9.4 8.9 - 10.3  mg/dL   GFR calc non Af Amer >60 >60 mL/min   GFR calc Af Amer >60 >60 mL/min   Anion gap 7 5 - 15  CBC     Status: None   Collection Time: 08/30/15  2:51 PM  Result Value Ref Range   WBC 7.6 3.6 - 11.0 K/uL   RBC 4.23 3.80 - 5.20 MIL/uL   Hemoglobin 12.6 12.0 - 16.0 g/dL   HCT 38.1 35.0 - 47.0 %   MCV 90.2 80.0 - 100.0 fL   MCH 29.7 26.0 - 34.0 pg   MCHC 32.9 32.0 - 36.0 g/dL   RDW 14.1 11.5 - 14.5 %   Platelets 275 150 - 440 K/uL  Troponin I     Status: None   Collection Time: 08/30/15  2:51 PM  Result Value Ref Range   Troponin I <0.03 <0.031 ng/mL  Troponin I     Status: None   Collection Time: 08/30/15  5:05 PM  Result Value Ref Range   Troponin I <0.03 <0.031 ng/mL  Troponin I (q 6hr x 3)     Status: None   Collection Time: 08/30/15 11:18 PM  Result Value Ref Range   Troponin I <0.03 <0.031 ng/mL  Troponin I (q 6hr x 3)     Status: None   Collection Time: 08/31/15  4:46 AM  Result Value Ref Range   Troponin I <0.03 <0.031 ng/mL   Dg Chest 2 View  08/30/2015   CLINICAL DATA:  LEFT side chest pain for 2 weeks, hypertension  EXAM: CHEST  2 VIEW  COMPARISON:  11/15/2012  FINDINGS: Normal heart size, mediastinal contours, and pulmonary vascularity.  Atherosclerotic calcification aorta.  Minimal central peribronchial thickening.  No pulmonary infiltrate, pleural effusion or pneumothorax.  Bones unremarkable.  IMPRESSION: No acute abnormalities.   Electronically Signed   By: Lavonia Dana M.D.   On: 08/30/2015 15:17     ASSESSMENT: 1  Chest pressure  Not completely typical for angina  Myoview normal perfusion.  Test is not 100% sensitive but I would treat as it is correct  Symptoms may have been exacerbated by BP   I would recomm increasing antihypertensive regimen to bring BP down  She has mult sensistivites to meds  AMlodipine supposedly raises BP  She had ?  Rxn to Bactrim  She cannot recall. I would try low dose HCTZ 12.5 mg to regimen  Continue Avapro  I do not think going up on dose will help much WIll make sure she has close f/u in clinic  2.  HL  On lipitor 10 3x per wk  LDL in 120s  I would increase to 7 days per week and follow tolerance  She does have some evid of aortic atherosclerosis on xray.  Should continue on ecASA  3  HTN  As above  WIll follow  OK to d/c from cardiac standpoint   I will make sure she has f/u in 10 to 14 days.    Tammy Manning

## 2015-08-31 NOTE — Discharge Summary (Signed)
[facility] NAME: Tammy Manning    MR#:  [id number]  DATE OF BIRTH:  1935-01-14  DATE OF ADMISSION:  08/30/2015 ADMITTING PHYSICIAN: Lytle Butte, MD  DATE OF DISCHARGE: 08/31/2015  1:45 PM  PRIMARY CARE PHYSICIAN: Loura Pardon, MD    ADMISSION DIAGNOSIS:  Essential hypertension [I10] Chest pain, unspecified chest pain type [R07.9]   DISCHARGE DIAGNOSIS:  Atypical chest pain Accelerated hypertension  SECONDARY DIAGNOSIS:   Past Medical History  Diagnosis Date  . Cancer (HCC)     Skin, basal cell on nose and eyelid  . Hyperlipidemia   . Arthritis     Knees  . DJD (degenerative joint disease)     Low back  . Dyspepsia   . B12 deficiency     Mild  . Lactose intolerance   . Mixed incontinence   . Vulvar irritation     from urine pad, uses Triamcinolone oint.  . Hypertension   . GERD (gastroesophageal reflux disease)   . PONV (postoperative nausea and vomiting)   . Bucket-handle tear of lateral meniscus of left knee as current injury 12/30/2012    HOSPITAL COURSE:   1. A typical chest pain, initiated aspirin/statin therapy, negative cardiac enzymes 3,  normal stress test.  2. Hyperlipidemia unspecified: Continue Lipitor daily. 3. GERD without esophagitis: PPI therapy 4. Accelerated hypertension: Continue Avapro and add HCTZ per Dr. Harrington Challenger, cardiologist.    DISCHARGE CONDITIONS:   Stable, discharged to home today.  CONSULTS OBTAINED:  Treatment Team:  Lytle Butte, MD Fay Records, MD Wellington Hampshire, MD  DRUG ALLERGIES:   Allergies  Allergen Reactions  . Amlodipine Besylate Other (See Comments)    Reaction:  Increases BP  . Antihistamines, Chlorpheniramine-Type Other (See Comments)    Reaction:  Makes pt hyper   . Atorvastatin Other (See Comments)    Reaction:  Myalgias   . Cefuroxime Axetil Other (See Comments)    Reaction:  Unknown   . Ciprofloxacin Other (See Comments)    Reaction:   Unknown   . Co Q 10 [Coenzyme Q10] Other (See Comments)    Reaction:  GI upset   . Crestor [Rosuvastatin Calcium] Other (See Comments)    Reaction:  Myalgias   . Dextromethorphan-Guaifenesin Other (See Comments)    Reaction:  Made pt jittery   . Epinephrine Hives  . Ramipril Other (See Comments)    Reaction:  Unknown   . Sulfamethoxazole-Trimethoprim Other (See Comments)    Reaction:  Unknown   . Vitamin D Analogs Other (See Comments)    Reaction:  GI upset     DISCHARGE MEDICATIONS:   Discharge Medication List as of 08/31/2015 12:55 PM    START taking these medications   Details  hydrochlorothiazide (HYDRODIURIL) 12.5 MG tablet Take 1 tablet (12.5 mg total) by mouth daily., Starting 08/31/2015, Until Discontinued, Print      CONTINUE these medications which have CHANGED   Details  atorvastatin (LIPITOR) 10 MG tablet Take 1 tablet (10 mg total) by mouth daily at 6 PM., Starting 08/31/2015, Until Discontinued, Print      CONTINUE these medications which have NOT CHANGED   Details  aspirin EC 81 MG tablet Take 81 mg by mouth daily., Until Discontinued, Historical Med    Calcium Carbonate-Vitamin D (CALCIUM 600+D) 600-400 MG-UNIT tablet Take 1 tablet by mouth daily., Until Discontinued, Historical Med    clobetasol ointment (TEMOVATE) 7.67 % Apply 1 application topically 2 (  two) times daily. To affected area sparingly, Starting 03/06/2015, Until Discontinued, Normal    clorazepate (TRANXENE) 7.5 MG tablet Take 1 tablet (7.5 mg total) by mouth daily as needed for anxiety., Starting 05/10/2015, Until Discontinued, Print    fluticasone (FLONASE) 50 MCG/ACT nasal spray Place 2 sprays into both nostrils daily as needed for rhinitis., Until Discontinued, Historical Med    irbesartan (AVAPRO) 150 MG tablet Take 150 mg by mouth daily., Until Discontinued, Historical Med    omeprazole (PRILOSEC) 20 MG capsule Take 1 capsule (20 mg total) by mouth daily as needed., Starting 03/06/2015,  Until Discontinued, Normal    chlorpheniramine-HYDROcodone (TUSSIONEX PENNKINETIC ER) 10-8 MG/5ML SUER Take 5 mLs by mouth 2 (two) times daily. Careful of sedation, Starting 05/10/2015, Until Discontinued, Print      STOP taking these medications     amoxicillin (AMOXIL) 500 MG capsule          DISCHARGE INSTRUCTIONS:    If you experience worsening of your admission symptoms, develop shortness of breath, life threatening emergency, suicidal or homicidal thoughts you must seek medical attention immediately by calling 911 or calling your MD immediately  if symptoms less severe.  You Must read complete instructions/literature along with all the possible adverse reactions/side effects for all the Medicines you take and that have been prescribed to you. Take any new Medicines after you have completely understood and accept all the possible adverse reactions/side effects.   Please note  You were cared for by a hospitalist during your hospital stay. If you have any questions about your discharge medications or the care you received while you were in the hospital after you are discharged, you can call the unit and asked to speak with the hospitalist on call if the hospitalist that took care of you is not available. Once you are discharged, your primary care physician will handle any further medical issues. Please note that NO REFILLS for any discharge medications will be authorized once you are discharged, as it is imperative that you return to your primary care physician (or establish a relationship with a primary care physician if you do not have one) for your aftercare needs so that they can reassess your need for medications and monitor your lab values.    Today   SUBJECTIVE   No complaint, no chest pain after admission.   VITAL SIGNS:  Blood pressure 156/61, pulse 80, temperature 98.3 F (36.8 C), temperature source Oral, resp. rate 17, height 5' 4"  (1.626 m), weight 95.255 kg (210  lb), SpO2 95 %.  I/O:   Intake/Output Summary (Last 24 hours) at 08/31/15 1433 Last data filed at 08/31/15 0800  Gross per 24 hour  Intake      0 ml  Output      0 ml  Net      0 ml    PHYSICAL EXAMINATION:  GENERAL:  80 y.o.-year-old patient lying in the bed with no acute distress.  EYES: Pupils equal, round, reactive to light and accommodation. No scleral icterus. Extraocular muscles intact.  HEENT: Head atraumatic, normocephalic. Oropharynx and nasopharynx clear.  NECK:  Supple, no jugular venous distention. No thyroid enlargement, no tenderness.  LUNGS: Normal breath sounds bilaterally, no wheezing, rales,rhonchi or crepitation. No use of accessory muscles of respiration.  CARDIOVASCULAR: S1, S2 normal. No murmurs, rubs, or gallops.  ABDOMEN: Soft, non-tender, non-distended. Bowel sounds present. No organomegaly or mass.  EXTREMITIES: No pedal edema, cyanosis, or clubbing.  NEUROLOGIC: Cranial nerves II through XII are  intact. Muscle strength 5/5 in all extremities. Sensation intact. Gait not checked.  PSYCHIATRIC: The patient is alert and oriented x 3.  SKIN: No obvious rash, lesion, or ulcer.   DATA REVIEW:   CBC  Recent Labs Lab 08/30/15 1451  WBC 7.6  HGB 12.6  HCT 38.1  PLT 275    Chemistries   Recent Labs Lab 08/30/15 1451  NA 143  K 3.7  CL 109  CO2 27  GLUCOSE 97  BUN 15  CREATININE 0.75  CALCIUM 9.4    Cardiac Enzymes  Recent Labs Lab 08/31/15 0446  TROPONINI <0.03    Microbiology Results  Results for orders placed or performed in visit on 02/05/12  Fecal occult blood, imunochemical     Status: None   Collection Time: 02/05/12  2:46 PM  Result Value Ref Range Status   Fecal Occult Bld Positive Negative Final    Comment: called and faxed to [facility]    RADIOLOGY:  Dg Chest 2 View  08/30/2015   CLINICAL DATA:  LEFT side chest pain for 2 weeks, hypertension  EXAM: CHEST  2 VIEW  COMPARISON:  11/15/2012  FINDINGS: Normal heart size,  mediastinal contours, and pulmonary vascularity.  Atherosclerotic calcification aorta.  Minimal central peribronchial thickening.  No pulmonary infiltrate, pleural effusion or pneumothorax.  Bones unremarkable.  IMPRESSION: No acute abnormalities.   Electronically Signed   By: Lavonia Dana M.D.   On: 08/30/2015 15:17   Nm Myocar Multi W/spect W/wall Motion / Ef  08/31/2015    There was no ST segment deviation noted during stress.  The study is normal.  This is a low risk study.  The left ventricular ejection fraction is normal (55-65%).   Normal low risk lexiscan stress sestimibi        Management plans discussed with the patient, family and they are in agreement.  CODE STATUS:     Code Status Orders        Start     Ordered   08/30/15 2021  Full code   Continuous     08/30/15 2021    Advance Directive Documentation        Most Recent Value   Type of Advance Directive  Living will   Pre-existing out of facility DNR order (yellow form or pink MOST form)     "MOST" Form in Place?        TOTAL TIME TAKING CARE OF THIS PATIENT: 32 minutes.    Demetrios Loll M.D on 08/31/2015 at 2:33 PM  Between 7am to 6pm - Pager - [phone]  After 6pm go to [email or web address] - password [email or web address] [facility]  [facility]  Office  [phone]  CC: Primary care physician; Loura Pardon, MD

## 2015-08-31 NOTE — Progress Notes (Signed)
Spoke with dr. Bridgett Larsson to make aware of cardiology request for hctz 12.35m and lipitor 142mpo daily, also follow up in 10-14 days. md made aware note is in from the cardiologist

## 2015-09-03 ENCOUNTER — Telehealth: Payer: Self-pay | Admitting: *Deleted

## 2015-09-03 NOTE — Telephone Encounter (Signed)
Transition Care Management Follow-up Telephone Call   Date discharged? 08/31/15   How have you been since you were released from the hospital? Patient is feeling "okay", she still does not feel herself but improving   Do you understand why you were in the hospital? yes   Do you understand the discharge instructions? yes   Where were you discharged to? Home   Items Reviewed:  Medications reviewed: yes  Allergies reviewed: yes  Dietary changes reviewed: no  Referrals reviewed: no   Functional Questionnaire:   Activities of Daily Living (ADLs):   She states they are independent in the following: ambulation, bathing and hygiene, feeding, continence, grooming, toileting and dressing States they require assistance with the following: None   Any transportation issues/concerns?: no   Any patient concerns? no   Confirmed importance and date/time of follow-up visits scheduled yes, 09/04/15 @ 1130  Provider Appointment booked with Loura Pardon, MD  Confirmed with patient if condition begins to worsen call PCP or go to the ER.  Patient was given the office number and encouraged to call back with question or concerns.  : yes

## 2015-09-03 NOTE — Telephone Encounter (Signed)
I will see her then

## 2015-09-04 ENCOUNTER — Encounter: Payer: Self-pay | Admitting: Family Medicine

## 2015-09-04 ENCOUNTER — Ambulatory Visit (INDEPENDENT_AMBULATORY_CARE_PROVIDER_SITE_OTHER): Payer: Medicare Other | Admitting: Family Medicine

## 2015-09-04 VITALS — BP 128/70 | HR 72 | Temp 98.3°F | Ht 64.25 in | Wt 206.5 lb

## 2015-09-04 DIAGNOSIS — I10 Essential (primary) hypertension: Secondary | ICD-10-CM | POA: Diagnosis not present

## 2015-09-04 DIAGNOSIS — F430 Acute stress reaction: Secondary | ICD-10-CM

## 2015-09-04 DIAGNOSIS — R0789 Other chest pain: Secondary | ICD-10-CM

## 2015-09-04 DIAGNOSIS — Z23 Encounter for immunization: Secondary | ICD-10-CM

## 2015-09-04 NOTE — Progress Notes (Signed)
Pre visit review using our clinic review tool, if applicable. No additional management support is needed unless otherwise documented below in the visit note. 

## 2015-09-04 NOTE — Progress Notes (Signed)
Subjective:    Patient ID: Tammy Manning, female    DOB: [birth year], 80 y.o.   MRN: [id number]  HPI Here for transitional care visit from hospitalization from 08/29/09/8 for atypical chest pain and elevated bp   Pt was started on asa, had neg cardiac enz times 3 and then a negative ETT  Results for orders placed or performed during the hospital encounter of 08/30/15  NM Myocar Multi W/Spect W/Wall Motion / EF  Result Value Ref Range   Rest HR 67 bpm   Rest BP 158/80 mmHg   Exercise duration (min)  min   Exercise duration (sec)  sec   Estimated workload  METS   Peak HR 104 bpm   Peak BP 194/78 mmHg   MPHR  bpm   Percent HR  %   RPE     LV Systolic Volume 28 mL   TID     LV Diastolic Volume 84 mL   LHR     SSS 0    SRS 0    SDS 0   Basic metabolic panel  Result Value Ref Range   Sodium 143 135 - 145 mmol/L   Potassium 3.7 3.5 - 5.1 mmol/L   Chloride 109 101 - 111 mmol/L   CO2 27 22 - 32 mmol/L   Glucose, Bld 97 65 - 99 mg/dL   BUN 15 6 - 20 mg/dL   Creatinine, Ser 0.75 0.44 - 1.00 mg/dL   Calcium 9.4 8.9 - 10.3 mg/dL   GFR calc non Af Amer >60 >60 mL/min   GFR calc Af Amer >60 >60 mL/min   Anion gap 7 5 - 15  CBC  Result Value Ref Range   WBC 7.6 3.6 - 11.0 K/uL   RBC 4.23 3.80 - 5.20 MIL/uL   Hemoglobin 12.6 12.0 - 16.0 g/dL   HCT 38.1 35.0 - 47.0 %   MCV 90.2 80.0 - 100.0 fL   MCH 29.7 26.0 - 34.0 pg   MCHC 32.9 32.0 - 36.0 g/dL   RDW 14.1 11.5 - 14.5 %   Platelets 275 150 - 440 K/uL  Troponin I  Result Value Ref Range   Troponin I <0.03 <0.031 ng/mL  Troponin I  Result Value Ref Range   Troponin I <0.03 <0.031 ng/mL  Troponin I (q 6hr x 3)  Result Value Ref Range   Troponin I <0.03 <0.031 ng/mL  Troponin I (q 6hr x 3)  Result Value Ref Range   Troponin I <0.03 <0.031 ng/mL    EKG showed NSR with rate of 75   For HTN -continued on avapro and hctz 12.5 mg given daily  She stopped the hctz because it made her feel weak -not on it  BP Readings  from Last 3 Encounters:  09/04/15 136/70  08/31/15 156/61  05/31/15 122/82   re check r arm 128/70  Her cuff is higher today 152/84  Feeling tired -otherwise ok  Has been generally more tired than usual (not quite herself) for about 3 weeks   Walks a lot for exercise (at least 2 mi per day) Some stress - husband's health - cannot get him to exercise  A very hard year   Coping tech include walking  Talks to her daughter and her DIL- she has good support  Needs more alone time also / time for self care  Patient Active Problem List   Diagnosis Date Noted  . Atypical chest pain 08/30/2015  . Encounter for Medicare annual  wellness exam 02/28/2013  . Bucket-handle tear of lateral meniscus of left knee as current injury 12/30/2012  . Palpitations 11/26/2012  . Episodic atrial fibrillation ([place]) 11/17/2012  . Special screening for malignant neoplasms, colon 01/21/2012  . Hearing loss of both ears 01/14/2012  . Adverse drug reaction 03/12/2011  . HEADACHE, CHRONIC 09/24/2010  . B12 deficiency 03/17/2010  . DYSPEPSIA 03/17/2010  . POSTMENOPAUSAL STATUS 08/20/2008  . Essential hypertension 07/19/2007  . Hyperlipidemia 06/24/2007  . CONSTIPATION 06/24/2007  . IBS 06/24/2007  . DERMATITIS, ATOPIC 06/24/2007  . SKIN CANCER, HX OF 06/24/2007   Past Medical History  Diagnosis Date  . Cancer (HCC)     Skin, basal cell on nose and eyelid  . Hyperlipidemia   . Arthritis     Knees  . DJD (degenerative joint disease)     Low back  . Dyspepsia   . B12 deficiency     Mild  . Lactose intolerance   . Mixed incontinence   . Vulvar irritation     from urine pad, uses Triamcinolone oint.  . Hypertension   . GERD (gastroesophageal reflux disease)   . PONV (postoperative nausea and vomiting)   . Bucket-handle tear of lateral meniscus of left knee as current injury 12/30/2012   Past Surgical History  Procedure Laterality Date  . Abdominal hysterectomy  1982    Large fibroids and bleeding   . Bilateral salpingoophorectomy  1986  . Bone graft hip iliac crest      To Left arm  . Appendectomy    . Breast reduction surgery    . Hemorrhoid surgery  2006  . Esophagogastroduodenoscopy      Polyps  . Skin cancer eyelid  2012  . Tonsillectomy    . Dilation and curettage of uterus      miscarragesx2  . Knee arthroscopy with lateral menisectomy Left 12/30/2012    Procedure: KNEE ARTHROSCOPY WITH LATERAL MENISECTOMY;  Surgeon: Johnny Bridge, MD;  Location: [facility];  Service: Orthopedics;  Laterality: Left;  LEFT KNEE SCOPE LATERAL MENISCECTOMY   Social History  Substance Use Topics  . Smoking status: Former Smoker -- 1.00 packs/day for 12 years    Types: Cigarettes    Quit date: 11/24/1963  . Smokeless tobacco: Never Used  . Alcohol Use: No     Comment: Rare   Family History  Problem Relation Age of Onset  . Cancer Mother     Colon  . Heart disease Father     CAD  . Diabetes Sister   . Diabetes Brother   . Leukemia Brother   . Cancer Paternal Aunt     Breast  . Diabetes Sister    Allergies  Allergen Reactions  . Amlodipine Besylate Other (See Comments)    Reaction:  Increases BP  . Antihistamines, Chlorpheniramine-Type Other (See Comments)    Reaction:  Makes pt hyper   . Atorvastatin Other (See Comments)    Reaction:  Myalgias   . Cefuroxime Axetil Other (See Comments)    Reaction:  Unknown   . Ciprofloxacin Other (See Comments)    Reaction:  Unknown   . Co Q 10 [Coenzyme Q10] Other (See Comments)    Reaction:  GI upset   . Crestor [Rosuvastatin Calcium] Other (See Comments)    Reaction:  Myalgias   . Dextromethorphan-Guaifenesin Other (See Comments)    Reaction:  Made pt jittery   . Epinephrine Hives  . Ramipril Other (See Comments)    Reaction:  Unknown   . Sulfamethoxazole-Trimethoprim Other (See Comments)    Reaction:  Unknown   . Vitamin D Analogs Other (See Comments)    Reaction:  GI upset    Current Outpatient Prescriptions  on File Prior to Visit  Medication Sig Dispense Refill  . aspirin EC 81 MG tablet Take 81 mg by mouth daily.    [name] Kitchen atorvastatin (LIPITOR) 10 MG tablet Take 1 tablet (10 mg total) by mouth daily at 6 PM. 30 tablet 0  . Calcium Carbonate-Vitamin D (CALCIUM 600+D) 600-400 MG-UNIT tablet Take 1 tablet by mouth daily.    . clobetasol ointment (TEMOVATE) 5.88 % Apply 1 application topically 2 (two) times daily. To affected area sparingly (Patient taking differently: Apply 1 application topically 2 (two) times daily as needed (for rash). ) 30 g 3  . clorazepate (TRANXENE) 7.5 MG tablet Take 1 tablet (7.5 mg total) by mouth daily as needed for anxiety. 30 tablet 0  . fluticasone (FLONASE) 50 MCG/ACT nasal spray Place 2 sprays into both nostrils daily as needed for rhinitis.    . hydrochlorothiazide (HYDRODIURIL) 12.5 MG tablet Take 1 tablet (12.5 mg total) by mouth daily. 30 tablet 0  . irbesartan (AVAPRO) 150 MG tablet Take 150 mg by mouth daily.    [name] Kitchen omeprazole (PRILOSEC) 20 MG capsule Take 1 capsule (20 mg total) by mouth daily as needed. (Patient taking differently: Take 20 mg by mouth daily as needed (for indigestion/heartburn). ) 30 capsule 5   No current facility-administered medications on file prior to visit.     Review of Systems Review of Systems  Constitutional: Negative for fever, appetite change,  and unexpected weight change. pos for generalized fatigue  Eyes: Negative for pain and visual disturbance.  Respiratory: Negative for cough and shortness of breath.   Cardiovascular: Negative for cp or palpitations    Gastrointestinal: Negative for nausea, diarrhea and constipation.  Genitourinary: Negative for urgency and frequency.  Skin: Negative for pallor or rash   Neurological: Negative for weakness, light-headedness, numbness and headaches.  Hematological: Negative for adenopathy. Does not bruise/bleed easily.  Psychiatric/Behavioral: Negative for dysphoric mood. The patient mildly  nervous/anxious.         Objective:   Physical Exam  Constitutional: She appears well-developed and well-nourished. No distress.  obese and well appearing   HENT:  Head: Normocephalic and atraumatic.  Mouth/Throat: Oropharynx is clear and moist.  Eyes: Conjunctivae and EOM are normal. Pupils are equal, round, and reactive to light.  Neck: Normal range of motion. Neck supple. No JVD present. Carotid bruit is not present. No thyromegaly present.  Cardiovascular: Normal rate, regular rhythm, normal heart sounds and intact distal pulses.  Exam reveals no gallop.   Pulmonary/Chest: Effort normal and breath sounds normal. No respiratory distress. She has no wheezes. She has no rales.  No crackles  Abdominal: Soft. Bowel sounds are normal. She exhibits no distension, no abdominal bruit and no mass. There is no tenderness.  Musculoskeletal: She exhibits no edema.  Lymphadenopathy:    She has no cervical adenopathy.  Neurological: She is alert. She has normal reflexes.  Skin: Skin is warm and dry. No rash noted.  Psychiatric: She has a normal mood and affect.  Candid about stressors  Good mood today but pt seems fatigued and overwhelmed           Assessment & Plan:   Problem List Items Addressed This Visit      Cardiovascular and Mediastinum   Essential hypertension - Primary  bp was transiently up during her recent hospitalization for atypical CP  Now improved  Did not tolerate the addn of hctz but bp seems back to baseline w/o it bp in fair control at this time  BP Readings from Last 1 Encounters:  09/04/15 128/70   No changes needed Disc lifstyle change with low sodium diet and exercise   Wt loss enc  DASH eating plan suggested         Other   Atypical chest pain    With neg w/u in hosp (and transient bp elevation that is now ok) Rev hospital records/ studies and labs in detail  Reassuring stress test Disc cardiac risk factors Will try to control wt /bp and  cholesterol   Suspect stress reaction played a role  Reviewed stressors/ coping techniques/symptoms/ support sources/ tx options and side effects in detail today       Stress reaction    Very difficult year with loss and family health problems May have played a role in her atypical chest pain  Reviewed stressors/ coping techniques/symptoms/ support sources/ tx options and side effects in detail today Good coping skills and support  Counseling offered        Other Visit Diagnoses    Need for shingles vaccine        Relevant Orders    Varicella-zoster vaccine subcutaneous (Completed)

## 2015-09-04 NOTE — Patient Instructions (Signed)
Blood pressure looks better  I'm glad chest discomfort is improved  You have had a very stressful year - I think you need some rest and should not go on the upcoming trip  Keep taking good care of yourself  If you want to see a counselor please let me know   You may want to get a new BP cuff- the one you have runs high   Zoster vaccine today

## 2015-09-08 DIAGNOSIS — F430 Acute stress reaction: Secondary | ICD-10-CM | POA: Insufficient documentation

## 2015-09-08 NOTE — Assessment & Plan Note (Signed)
Very difficult year with loss and family health problems May have played a role in her atypical chest pain  Reviewed stressors/ coping techniques/symptoms/ support sources/ tx options and side effects in detail today Good coping skills and support  Counseling offered

## 2015-09-08 NOTE — Assessment & Plan Note (Signed)
With neg w/u in hosp (and transient bp elevation that is now ok) Rev hospital records/ studies and labs in detail  Reassuring stress test Disc cardiac risk factors Will try to control wt /bp and cholesterol   Suspect stress reaction played a role  Reviewed stressors/ coping techniques/symptoms/ support sources/ tx options and side effects in detail today

## 2015-09-08 NOTE — Assessment & Plan Note (Signed)
bp was transiently up during her recent hospitalization for atypical CP  Now improved  Did not tolerate the addn of hctz but bp seems back to baseline w/o it bp in fair control at this time  BP Readings from Last 1 Encounters:  09/04/15 128/70   No changes needed Disc lifstyle change with low sodium diet and exercise   Wt loss enc  DASH eating plan suggested

## 2015-09-10 ENCOUNTER — Other Ambulatory Visit: Payer: Self-pay | Admitting: Cardiovascular Disease

## 2015-09-10 ENCOUNTER — Encounter: Payer: Self-pay | Admitting: Cardiovascular Disease

## 2015-09-10 ENCOUNTER — Ambulatory Visit (INDEPENDENT_AMBULATORY_CARE_PROVIDER_SITE_OTHER): Payer: Medicare Other | Admitting: Cardiovascular Disease

## 2015-09-10 VITALS — BP 140/60 | HR 72 | Ht 64.0 in | Wt 209.8 lb

## 2015-09-10 DIAGNOSIS — I351 Nonrheumatic aortic (valve) insufficiency: Secondary | ICD-10-CM

## 2015-09-10 DIAGNOSIS — I209 Angina pectoris, unspecified: Secondary | ICD-10-CM | POA: Diagnosis not present

## 2015-09-10 DIAGNOSIS — I10 Essential (primary) hypertension: Secondary | ICD-10-CM

## 2015-09-10 DIAGNOSIS — I159 Secondary hypertension, unspecified: Secondary | ICD-10-CM

## 2015-09-10 DIAGNOSIS — R011 Cardiac murmur, unspecified: Secondary | ICD-10-CM | POA: Diagnosis not present

## 2015-09-10 MED ORDER — METOPROLOL SUCCINATE ER 25 MG PO TB24: 25.0000 mg | ORAL_TABLET | Freq: Every day | ORAL | Status: DC

## 2015-09-10 NOTE — Assessment & Plan Note (Signed)
Previous echocardiogram in 2014 showed mild aortic insufficiency. However, she now has a prominent AI murmur. Thus, I requested an echocardiogram.

## 2015-09-10 NOTE — Patient Instructions (Signed)
Medication Instructions:  Your physician has recommended you make the following change in your medication:  START metoprolol 82m once per day   Labwork: none  Testing/Procedures: Your physician has requested that you have an echocardiogram. Echocardiography is a painless test that uses sound waves to create images of your heart. It provides your doctor with information about the size and shape of your heart and how well your heart's chambers and valves are working. This procedure takes approximately one hour. There are no restrictions for this procedure.    Follow-Up: Your physician recommends that you schedule a follow-up appointment in: one month with Dr. AFletcher Anon    Any Other Special Instructions Will Be Listed Below (If Applicable).  Echocardiogram An echocardiogram, or echocardiography, uses sound waves (ultrasound) to produce an image of your heart. The echocardiogram is simple, painless, obtained within a short period of time, and offers valuable information to your health care provider. The images from an echocardiogram can provide information such as:  Evidence of coronary artery disease (CAD).  Heart size.  Heart muscle function.  Heart valve function.  Aneurysm detection.  Evidence of a past heart attack.  Fluid buildup around the heart.  Heart muscle thickening.  Assess heart valve function. LET [facility] KNOW ABOUT:  Any allergies you have.  All medicines you are taking, including vitamins, herbs, eye drops, creams, and over-the-counter medicines.  Previous problems you or members of your family have had with the use of anesthetics.  Any blood disorders you have.  Previous surgeries you have had.  Medical conditions you have.  Possibility of pregnancy, if this applies. BEFORE THE PROCEDURE  No special preparation is needed. Eat and drink normally.  PROCEDURE   In order to produce an image of your heart, gel will be applied to your  chest and a wand-like tool (transducer) will be moved over your chest. The gel will help transmit the sound waves from the transducer. The sound waves will harmlessly bounce off your heart to allow the heart images to be captured in real-time motion. These images will then be recorded.  You may need an IV to receive a medicine that improves the quality of the pictures. AFTER THE PROCEDURE You may return to your normal schedule including diet, activities, and medicines, unless your health care provider tells you otherwise.   This information is not intended to replace advice given to you by your health care provider. Make sure you discuss any questions you have with your health care provider.   Document Released: 11/06/2000 Document Revised: 11/30/2014 Document Reviewed: 07/17/2013 Elsevier Interactive Patient Education 2[employer]

## 2015-09-10 NOTE — Assessment & Plan Note (Signed)
The patient's recent symptoms of exertional chest tightness are highly suggestive of class II angina. She underwent a recent nuclear stress test which was overall normal and low risk. Thus, it's reasonable to attempt medical therapy before considering cardiac catheterization. I added small dose Toprol. Reevaluate symptoms in one month after echocardiogram.

## 2015-09-10 NOTE — Assessment & Plan Note (Signed)
Blood pressure is mildly elevated with history of intolerance to multiple medications. I added Toprol as outlined above.

## 2015-09-10 NOTE — Progress Notes (Signed)
Primary care physician: Dr. Glori Bickers  HPI  This is a pleasant 80 year old female who is here today for follow-up visit regarding chest pain. She was seen by me in the past for palpitations. She has known history of hypertension and osteoarthritis. She was hospitalized recently at [facility] for chest pain of 2 weeks' duration. This was intermittent and described as pressure when walking her dog especially going uphill. She was noted to be hypertensive. Cardiac enzymes were negative and EKG showed no acute changes. She had a pharmacologic nuclear stress test done which showed normal perfusion and ejection fraction. Hydrochlorothiazide was added for hypertension. She took this medication or 3 days and then stopped as her blood pressure dropped and she felt dehydrated. She feels better overall and has resumed physical activities. However, she slowed down because of the symptoms. She reports significant stress over the last year.   Allergies  Allergen Reactions  . Amlodipine Besylate Other (See Comments)    Reaction:  Increases BP  . Antihistamines, Chlorpheniramine-Type Other (See Comments)    Reaction:  Makes pt hyper   . Atorvastatin Other (See Comments)    Reaction:  Myalgias   . Cefuroxime Axetil Other (See Comments)    Upset stomach  . Ciprofloxacin Other (See Comments)    Upset stomach  . Co Q 10 [Coenzyme Q10] Other (See Comments)    Reaction:  GI upset   . Crestor [Rosuvastatin Calcium] Other (See Comments)    Reaction:  Myalgias   . Dextromethorphan-Guaifenesin Other (See Comments)    Reaction:  Made pt jittery   . Epinephrine Hives  . Ramipril Other (See Comments)    Upset stomach  . Sulfamethoxazole-Trimethoprim Other (See Comments)    Upset stomach  . Vitamin D Analogs Other (See Comments)    Reaction:  GI upset      Current Outpatient Prescriptions on File Prior to Visit  Medication Sig Dispense Refill  . aspirin EC 81 MG tablet Take 81 mg by mouth daily.    [name] Kitchen atorvastatin  (LIPITOR) 10 MG tablet Take 1 tablet (10 mg total) by mouth daily at 6 PM. 30 tablet 0  . Calcium Carbonate-Vitamin D (CALCIUM 600+D) 600-400 MG-UNIT tablet Take 1 tablet by mouth daily.    . clorazepate (TRANXENE) 7.5 MG tablet Take 1 tablet (7.5 mg total) by mouth daily as needed for anxiety. 30 tablet 0  . fluticasone (FLONASE) 50 MCG/ACT nasal spray Place 2 sprays into both nostrils daily as needed for rhinitis.    [name] Kitchen irbesartan (AVAPRO) 150 MG tablet Take 150 mg by mouth daily.     No current facility-administered medications on file prior to visit.     Past Medical History  Diagnosis Date  . Cancer (HCC)     Skin, basal cell on nose and eyelid  . Hyperlipidemia   . Arthritis     Knees  . DJD (degenerative joint disease)     Low back  . Dyspepsia   . B12 deficiency     Mild  . Lactose intolerance   . Mixed incontinence   . Vulvar irritation     from urine pad, uses Triamcinolone oint.  . Hypertension   . GERD (gastroesophageal reflux disease)   . PONV (postoperative nausea and vomiting)   . Bucket-handle tear of lateral meniscus of left knee as current injury 12/30/2012     Past Surgical History  Procedure Laterality Date  . Abdominal hysterectomy  1982    Large fibroids and bleeding  . Bilateral  salpingoophorectomy  1986  . Bone graft hip iliac crest      To Left arm  . Appendectomy    . Breast reduction surgery    . Hemorrhoid surgery  2006  . Esophagogastroduodenoscopy      Polyps  . Skin cancer eyelid  2012  . Tonsillectomy    . Dilation and curettage of uterus      miscarragesx2  . Knee arthroscopy with lateral menisectomy Left 12/30/2012    Procedure: KNEE ARTHROSCOPY WITH LATERAL MENISECTOMY;  Surgeon: Johnny Bridge, MD;  Location: [facility];  Service: Orthopedics;  Laterality: Left;  LEFT KNEE SCOPE LATERAL MENISCECTOMY     Family History  Problem Relation Age of Onset  . Cancer Mother     Colon  . Heart disease Father     CAD  .  Diabetes Sister   . Diabetes Brother   . Leukemia Brother   . Cancer Paternal Aunt     Breast  . Diabetes Sister      Social History   Social History  . Marital Status: Married    Spouse Name: N/A  . Number of Children: 2  . Years of Education: N/A   Occupational History  . Proofreader    Social History Main Topics  . Smoking status: Former Smoker -- 1.00 packs/day for 12 years    Types: Cigarettes    Quit date: 11/24/1963  . Smokeless tobacco: Never Used  . Alcohol Use: No     Comment: Rare  . Drug Use: No  . Sexual Activity: Not on file   Other Topics Concern  . Not on file   Social History Narrative      PHYSICAL EXAM   BP 140/60 mmHg  Pulse 72  Ht 5' 4"  (1.626 m)  Wt 209 lb 12.8 oz (95.165 kg)  BMI 35.99 kg/m2 Constitutional: She is oriented to person, place, and time. She appears well-developed and well-nourished. No distress.  HENT: No nasal discharge.  Head: Normocephalic and atraumatic.  Eyes: Pupils are equal and round. No discharge.  Neck: Normal range of motion. Neck supple. No JVD present. No thyromegaly present.  Cardiovascular: Normal rate, regular rhythm, normal heart sounds. Exam reveals no gallop and no friction rub. There is a 2/6 decrescendo murmur in the aortic area. Pulmonary/Chest: Effort normal and breath sounds normal. No stridor. No respiratory distress. She has no wheezes. She has no rales. She exhibits no tenderness.  Abdominal: Soft. Bowel sounds are normal. She exhibits no distension. There is no tenderness. There is no rebound and no guarding.  Musculoskeletal: Normal range of motion. She exhibits no edema and no tenderness.  Neurological: She is alert and oriented to person, place, and time. Coordination normal.  Skin: Skin is warm and dry. No rash noted. She is not diaphoretic. No erythema. No pallor.  Psychiatric: She has a normal mood and affect. Her behavior is normal. Judgment and thought content normal.     EKG:  Normal sinus rhythm with moderate LVH.   ASSESSMENT AND PLAN

## 2015-09-18 ENCOUNTER — Ambulatory Visit (INDEPENDENT_AMBULATORY_CARE_PROVIDER_SITE_OTHER): Payer: Medicare Other | Admitting: *Deleted

## 2015-09-18 DIAGNOSIS — E538 Deficiency of other specified B group vitamins: Secondary | ICD-10-CM | POA: Diagnosis not present

## 2015-09-18 MED ORDER — CYANOCOBALAMIN 1000 MCG/ML IJ SOLN
1000.0000 ug | Freq: Once | INTRAMUSCULAR | Status: AC
  Administered 2015-09-18: 1000 ug via INTRAMUSCULAR

## 2015-09-25 ENCOUNTER — Encounter: Payer: Medicare Other | Admitting: Physician Assistant

## 2015-10-01 ENCOUNTER — Encounter: Payer: Self-pay | Admitting: Internal Medicine

## 2015-10-01 ENCOUNTER — Ambulatory Visit (INDEPENDENT_AMBULATORY_CARE_PROVIDER_SITE_OTHER): Payer: Medicare Other | Admitting: Internal Medicine

## 2015-10-01 VITALS — BP 134/78 | HR 70 | Temp 97.9°F | Wt 211.0 lb

## 2015-10-01 DIAGNOSIS — R059 Cough, unspecified: Secondary | ICD-10-CM

## 2015-10-01 DIAGNOSIS — I209 Angina pectoris, unspecified: Secondary | ICD-10-CM

## 2015-10-01 DIAGNOSIS — R05 Cough: Secondary | ICD-10-CM | POA: Diagnosis not present

## 2015-10-01 DIAGNOSIS — J069 Acute upper respiratory infection, unspecified: Secondary | ICD-10-CM

## 2015-10-01 MED ORDER — HYDROCOD POLST-CPM POLST ER 10-8 MG/5ML PO SUER: 5.0000 mL | Freq: Every evening | ORAL | Status: DC | PRN

## 2015-10-01 NOTE — Progress Notes (Signed)
HPI  Pt presents to the clinic today with c/o headache, sore throat and cough. This started 4-5 days ago. She denies runny nose or ear pain. The cough is non productive. She denies shortness of breath. The cough is worse at night. She denies fever or chills but has had some body aches. She has tried Flonase and Mucinex with some relief. She has no history of allergies or breathing problems. She has not had sick contacts that she is aware of.  Review of Systems      Past Medical History  Diagnosis Date  . Cancer (HCC)     Skin, basal cell on nose and eyelid  . Hyperlipidemia   . Arthritis     Knees  . DJD (degenerative joint disease)     Low back  . Dyspepsia   . B12 deficiency     Mild  . Lactose intolerance   . Mixed incontinence   . Vulvar irritation     from urine pad, uses Triamcinolone oint.  . Hypertension   . GERD (gastroesophageal reflux disease)   . PONV (postoperative nausea and vomiting)   . Bucket-handle tear of lateral meniscus of left knee as current injury 12/30/2012    Family History  Problem Relation Age of Onset  . Cancer Mother     Colon  . Heart disease Father     CAD  . Diabetes Sister   . Diabetes Brother   . Leukemia Brother   . Cancer Paternal Aunt     Breast  . Diabetes Sister     Social History   Social History  . Marital Status: Married    Spouse Name: N/A  . Number of Children: 2  . Years of Education: N/A   Occupational History  . Proofreader    Social History Main Topics  . Smoking status: Former Smoker -- 1.00 packs/day for 12 years    Types: Cigarettes    Quit date: 11/24/1963  . Smokeless tobacco: Never Used  . Alcohol Use: No     Comment: Rare  . Drug Use: No  . Sexual Activity: Not on file   Other Topics Concern  . Not on file   Social History Narrative    Allergies  Allergen Reactions  . Amlodipine Besylate Other (See Comments)    Reaction:  Increases BP  . Antihistamines, Chlorpheniramine-Type Other  (See Comments)    Reaction:  Makes pt hyper   . Atorvastatin Other (See Comments)    Reaction:  Myalgias   . Cefuroxime Axetil Other (See Comments)    Upset stomach  . Ciprofloxacin Other (See Comments)    Upset stomach  . Co Q 10 [Coenzyme Q10] Other (See Comments)    Reaction:  GI upset   . Crestor [Rosuvastatin Calcium] Other (See Comments)    Reaction:  Myalgias   . Dextromethorphan-Guaifenesin Other (See Comments)    Reaction:  Made pt jittery   . Epinephrine Hives  . Ramipril Other (See Comments)    Upset stomach  . Sulfamethoxazole-Trimethoprim Other (See Comments)    Upset stomach  . Vitamin D Analogs Other (See Comments)    Reaction:  GI upset      Constitutional: Positive headache. Denies fatigue, fever or abrupt weight changes.  HEENT:  Positive sore throat. Denies eye redness, eye pain, pressure behind the eyes, facial pain, nasal congestion, ear pain, ringing in the ears, wax buildup, runny nose or bloody nose. Respiratory: Positive cough. Denies difficulty breathing or shortness of  breath.  Cardiovascular: Denies chest pain, chest tightness, palpitations or swelling in the hands or feet.   No other specific complaints in a complete review of systems (except as listed in HPI above).  Objective:   BP 134/78 mmHg  Pulse 70  Temp(Src) 97.9 F (36.6 C) (Oral)  Wt 211 lb (95.709 kg)  SpO2 98% Wt Readings from Last 3 Encounters:  10/01/15 211 lb (95.709 kg)  09/10/15 209 lb 12.8 oz (95.165 kg)  09/04/15 206 lb 8 oz (93.668 kg)     General: Appears her stated age, in NAD. HEENT: Head: normal shape and size, no sinus tenderness noted; Eyes: sclera white, no icterus, conjunctiva pink; Ears: Tm's gray and intact, normal light reflex; Nose: mucosa pink and moist, septum midline; Throat/Mouth: + PND. Teeth present, mucosa pink and moist, no exudate noted, no lesions or ulcerations noted.  Neck: No cervical lymphadenopathy.  Cardiovascular: Normal rate and rhythm.  S1,S2 noted.  No murmur, rubs or gallops noted.  Pulmonary/Chest: Normal effort and positive vesicular breath sounds. No respiratory distress. No wheezes, rales or ronchi noted.      Assessment & Plan:   Upper Respiratory Infection:  Viral Get some rest and drink plenty of water Do salt water gargles for the sore throat Continue Mucinex RX for Tussionex cough syrup  RTC as needed or if symptoms persist.

## 2015-10-01 NOTE — Patient Instructions (Signed)

## 2015-10-01 NOTE — Progress Notes (Signed)
   Subjective:    Patient ID: Tammy Manning, female    DOB: 1935-07-04, 80 y.o.   MRN: [id number]  HPI Tammy Manning is a 80 year old female who presents today with chief complaint of non productive cough for 4-5 days, especially at night. She has tried Flonase and OTC Mucinex without relief.  She denies sinus pressure and pain, post nasal drip and fever.    Review of Systems  Constitutional: Negative for fever, chills and fatigue.  HENT: Negative for congestion, postnasal drip, rhinorrhea and sinus pressure.   Respiratory: Positive for cough. Negative for shortness of breath and wheezing.   Cardiovascular: Negative for chest pain, palpitations and leg swelling.  Neurological: Negative for dizziness, light-headedness and numbness.   Family History  Problem Relation Age of Onset  . Cancer Mother     Colon  . Heart disease Father     CAD  . Diabetes Sister   . Diabetes Brother   . Leukemia Brother   . Cancer Paternal Aunt     Breast  . Diabetes Sister    Current Outpatient Prescriptions on File Prior to Visit  Medication Sig Dispense Refill  . aspirin EC 81 MG tablet Take 81 mg by mouth daily.    [name] Kitchen atorvastatin (LIPITOR) 10 MG tablet Take 1 tablet (10 mg total) by mouth daily at 6 PM. 30 tablet 0  . Calcium Carbonate-Vitamin D (CALCIUM 600+D) 600-400 MG-UNIT tablet Take 1 tablet by mouth daily.    . clobetasol cream (TEMOVATE) 2.75 % Apply 1 application topically 2 (two) times daily as needed (rash).    . clorazepate (TRANXENE) 7.5 MG tablet Take 1 tablet (7.5 mg total) by mouth daily as needed for anxiety. 30 tablet 0  . fluticasone (FLONASE) 50 MCG/ACT nasal spray Place 2 sprays into both nostrils daily as needed for rhinitis.    [name] Kitchen irbesartan (AVAPRO) 150 MG tablet Take 150 mg by mouth daily.    [name] Kitchen omeprazole (PRILOSEC) 20 MG capsule Take 20 mg by mouth daily as needed (heartburn).    . metoprolol succinate (TOPROL-XL) 25 MG 24 hr tablet TAKE 1 TABLET(25 MG) BY MOUTH DAILY (Patient  not taking: Reported on 10/01/2015) 90 tablet 3   No current facility-administered medications on file prior to visit.        Objective:   Physical Exam  Constitutional: She is oriented to person, place, and time. She appears well-developed and well-nourished.  HENT:  Head: Normocephalic and atraumatic.  Left Ear: External ear normal.  Mouth/Throat: Oropharynx is clear and moist. No oropharyngeal exudate.  Eyes: Pupils are equal, round, and reactive to light.  Neck: Normal range of motion. Neck supple.  Cardiovascular: Normal rate, regular rhythm, normal heart sounds and intact distal pulses.   No murmur heard. Pulmonary/Chest: Effort normal and breath sounds normal.  Abdominal: Soft. Bowel sounds are normal.  Musculoskeletal: Normal range of motion.  Lymphadenopathy:    She has no cervical adenopathy.  Neurological: She is alert and oriented to person, place, and time.  Skin: Skin is warm and dry.  Psychiatric: She has a normal mood and affect.    BP 134/78 mmHg  Pulse 70  Temp(Src) 97.9 F (36.6 C) (Oral)  Wt 211 lb (95.709 kg)  SpO2 98%       Assessment & Plan:  1. Cough Rx for Tussionex. Advised patient to call office if symptoms worsen in the next 4-5 days, or if fever develops.  Increase fluid intake.

## 2015-10-01 NOTE — Progress Notes (Signed)
Pre visit review using our clinic review tool, if applicable. No additional management support is needed unless otherwise documented below in the visit note. 

## 2015-10-07 ENCOUNTER — Ambulatory Visit (INDEPENDENT_AMBULATORY_CARE_PROVIDER_SITE_OTHER): Payer: Medicare Other

## 2015-10-07 ENCOUNTER — Other Ambulatory Visit: Payer: Self-pay

## 2015-10-07 DIAGNOSIS — R011 Cardiac murmur, unspecified: Secondary | ICD-10-CM | POA: Diagnosis not present

## 2015-10-11 ENCOUNTER — Encounter: Payer: Self-pay | Admitting: Cardiovascular Disease

## 2015-10-11 ENCOUNTER — Ambulatory Visit (INDEPENDENT_AMBULATORY_CARE_PROVIDER_SITE_OTHER): Payer: Medicare Other | Admitting: Cardiovascular Disease

## 2015-10-11 VITALS — BP 130/70 | HR 72 | Ht 65.0 in | Wt 212.5 lb

## 2015-10-11 DIAGNOSIS — I10 Essential (primary) hypertension: Secondary | ICD-10-CM | POA: Diagnosis not present

## 2015-10-11 DIAGNOSIS — I351 Nonrheumatic aortic (valve) insufficiency: Secondary | ICD-10-CM

## 2015-10-11 DIAGNOSIS — I209 Angina pectoris, unspecified: Secondary | ICD-10-CM

## 2015-10-11 DIAGNOSIS — E785 Hyperlipidemia, unspecified: Secondary | ICD-10-CM

## 2015-10-11 NOTE — Assessment & Plan Note (Signed)
Lab Results  Component Value Date   CHOL 211* 02/27/2015   HDL 70.30 02/27/2015   LDLCALC 124* 02/27/2015   LDLDIRECT 105.9 02/08/2013   TRIG 83.0 02/27/2015   CHOLHDL 3 02/27/2015   She is currently on atorvastatin. I recommend a target LDL of less than 100.

## 2015-10-11 NOTE — Assessment & Plan Note (Signed)
This was mild on most recent echocardiogram. I will plan on following her on a yearly basis.

## 2015-10-11 NOTE — Assessment & Plan Note (Signed)
I started her on small dose Toprol during last visit but she did not take the medication as her blood pressure went back to normal. She is currently on Avapro with reasonable control.

## 2015-10-11 NOTE — Progress Notes (Signed)
Primary care physician: Dr. Glori Bickers  HPI  This is a pleasant 80 year old female who is here today for follow-up visit regarding chest pain and mild aortic insufficiency. She was seen by me in the past for palpitations. She has known history of hypertension and osteoarthritis.  She was hospitalized at [facility] in October for chest pain. she underwent a pharmacologic nuclear stress test done which showed normal perfusion and ejection fraction.  Her symptoms were felt to be due to stress. She was noted during last visit to have a prominent AI murmur and thus an echocardiogram was done which showed normal LV systolic function with stable mild aortic insufficiency.  Allergies  Allergen Reactions  . Amlodipine Besylate Other (See Comments)    Reaction:  Increases BP  . Antihistamines, Chlorpheniramine-Type Other (See Comments)    Reaction:  Makes pt hyper   . Atorvastatin Other (See Comments)    Reaction:  Myalgias   . Cefuroxime Axetil Other (See Comments)    Upset stomach  . Ciprofloxacin Other (See Comments)    Upset stomach  . Co Q 10 [Coenzyme Q10] Other (See Comments)    Reaction:  GI upset   . Crestor [Rosuvastatin Calcium] Other (See Comments)    Reaction:  Myalgias   . Dextromethorphan-Guaifenesin Other (See Comments)    Reaction:  Made pt jittery   . Epinephrine Hives  . Ramipril Other (See Comments)    Upset stomach  . Sulfamethoxazole-Trimethoprim Other (See Comments)    Upset stomach  . Vitamin D Analogs Other (See Comments)    Reaction:  GI upset      Current Outpatient Prescriptions on File Prior to Visit  Medication Sig Dispense Refill  . aspirin EC 81 MG tablet Take 81 mg by mouth daily.    [name] Kitchen atorvastatin (LIPITOR) 10 MG tablet Take 1 tablet (10 mg total) by mouth daily at 6 PM. 30 tablet 0  . Calcium Carbonate-Vitamin D (CALCIUM 600+D) 600-400 MG-UNIT tablet Take 1 tablet by mouth daily.    . chlorpheniramine-HYDROcodone (TUSSIONEX PENNKINETIC ER) 10-8 MG/5ML SUER  Take 5 mLs by mouth at bedtime as needed for cough. 115 mL 0  . clobetasol cream (TEMOVATE) 3.90 % Apply 1 application topically 2 (two) times daily as needed (rash).    . clorazepate (TRANXENE) 7.5 MG tablet Take 1 tablet (7.5 mg total) by mouth daily as needed for anxiety. 30 tablet 0  . fluticasone (FLONASE) 50 MCG/ACT nasal spray Place 2 sprays into both nostrils daily as needed for rhinitis.    [name] Kitchen irbesartan (AVAPRO) 150 MG tablet Take 150 mg by mouth daily.    . metoprolol succinate (TOPROL-XL) 25 MG 24 hr tablet TAKE 1 TABLET(25 MG) BY MOUTH DAILY 90 tablet 3  . omeprazole (PRILOSEC) 20 MG capsule Take 20 mg by mouth daily as needed (heartburn).     No current facility-administered medications on file prior to visit.     Past Medical History  Diagnosis Date  . Cancer (HCC)     Skin, basal cell on nose and eyelid  . Hyperlipidemia   . Arthritis     Knees  . DJD (degenerative joint disease)     Low back  . Dyspepsia   . B12 deficiency     Mild  . Lactose intolerance   . Mixed incontinence   . Vulvar irritation     from urine pad, uses Triamcinolone oint.  . Hypertension   . GERD (gastroesophageal reflux disease)   . PONV (postoperative nausea and vomiting)   .  Bucket-handle tear of lateral meniscus of left knee as current injury 12/30/2012     Past Surgical History  Procedure Laterality Date  . Abdominal hysterectomy  1982    Large fibroids and bleeding  . Bilateral salpingoophorectomy  1986  . Bone graft hip iliac crest      To Left arm  . Appendectomy    . Breast reduction surgery    . Hemorrhoid surgery  2006  . Esophagogastroduodenoscopy      Polyps  . Skin cancer eyelid  2012  . Tonsillectomy    . Dilation and curettage of uterus      miscarragesx2  . Knee arthroscopy with lateral menisectomy Left 12/30/2012    Procedure: KNEE ARTHROSCOPY WITH LATERAL MENISECTOMY;  Surgeon: Johnny Bridge, MD;  Location: [place];  Service: Orthopedics;   Laterality: Left;  LEFT KNEE SCOPE LATERAL MENISCECTOMY     Family History  Problem Relation Age of Onset  . Cancer Mother     Colon  . Heart disease Father     CAD  . Diabetes Sister   . Diabetes Brother   . Leukemia Brother   . Cancer Paternal Aunt     Breast  . Diabetes Sister      Social History   Social History  . Marital Status: Married    Spouse Name: N/A  . Number of Children: 2  . Years of Education: N/A   Occupational History  . Proofreader    Social History Main Topics  . Smoking status: Former Smoker -- 1.00 packs/day for 12 years    Types: Cigarettes    Quit date: 11/24/1963  . Smokeless tobacco: Never Used  . Alcohol Use: No     Comment: Rare  . Drug Use: No  . Sexual Activity: Not on file   Other Topics Concern  . Not on file   Social History Narrative      PHYSICAL EXAM   BP 130/70 mmHg  Pulse 72  Ht 5' 5"  (1.651 m)  Wt 212 lb 8 oz (96.389 kg)  BMI 35.36 kg/m2  SpO2 98% Constitutional: She is oriented to person, place, and time. She appears well-developed and well-nourished. No distress.  HENT: No nasal discharge.  Head: Normocephalic and atraumatic.  Eyes: Pupils are equal and round. No discharge.  Neck: Normal range of motion. Neck supple. No JVD present. No thyromegaly present.  Cardiovascular: Normal rate, regular rhythm, normal heart sounds. Exam reveals no gallop and no friction rub. There is a 2/6 decrescendo murmur in the aortic area. Pulmonary/Chest: Effort normal and breath sounds normal. No stridor. No respiratory distress. She has no wheezes. She has no rales. She exhibits no tenderness.  Abdominal: Soft. Bowel sounds are normal. She exhibits no distension. There is no tenderness. There is no rebound and no guarding.  Musculoskeletal: Normal range of motion. She exhibits no edema and no tenderness.  Neurological: She is alert and oriented to person, place, and time. Coordination normal.  Skin: Skin is warm and dry.  No rash noted. She is not diaphoretic. No erythema. No pallor.  Psychiatric: She has a normal mood and affect. Her behavior is normal. Judgment and thought content normal.      ASSESSMENT AND PLAN

## 2015-10-11 NOTE — Patient Instructions (Signed)
Medication Instructions: Continue same medications.   Labwork: None.   Procedures/Testing: None.   Follow-Up: 1 year follow up with Dr. Fletcher Anon.   Any Additional Special Instructions Will Be Listed Below (If Applicable).

## 2015-10-11 NOTE — Assessment & Plan Note (Signed)
Symptoms overall improved and likely related to stress. Recent cardiac workup was unremarkable. Continue observation.

## 2015-10-23 ENCOUNTER — Other Ambulatory Visit: Payer: Self-pay | Admitting: Family Medicine

## 2015-11-01 ENCOUNTER — Ambulatory Visit (INDEPENDENT_AMBULATORY_CARE_PROVIDER_SITE_OTHER): Payer: Medicare Other | Admitting: *Deleted

## 2015-11-01 DIAGNOSIS — E538 Deficiency of other specified B group vitamins: Secondary | ICD-10-CM | POA: Diagnosis not present

## 2015-11-01 MED ORDER — CYANOCOBALAMIN 1000 MCG/ML IJ SOLN
1000.0000 ug | Freq: Once | INTRAMUSCULAR | Status: AC
  Administered 2015-11-01: 1000 ug via INTRAMUSCULAR

## 2015-11-06 ENCOUNTER — Other Ambulatory Visit: Payer: Self-pay | Admitting: Family Medicine

## 2015-11-06 MED ORDER — CLORAZEPATE DIPOTASSIUM 7.5 MG PO TABS: 7.5000 mg | ORAL_TABLET | Freq: Every day | ORAL | Status: DC | PRN

## 2015-11-06 NOTE — Telephone Encounter (Signed)
Px written for call in   

## 2015-11-07 NOTE — Telephone Encounter (Signed)
Px called in as directed.

## 2015-12-13 ENCOUNTER — Ambulatory Visit (INDEPENDENT_AMBULATORY_CARE_PROVIDER_SITE_OTHER): Payer: Medicare Other

## 2015-12-13 DIAGNOSIS — E538 Deficiency of other specified B group vitamins: Secondary | ICD-10-CM | POA: Diagnosis not present

## 2015-12-13 MED ORDER — CYANOCOBALAMIN 1000 MCG/ML IJ SOLN
1000.0000 ug | Freq: Once | INTRAMUSCULAR | Status: AC
  Administered 2015-12-13: 1000 ug via INTRAMUSCULAR

## 2015-12-17 ENCOUNTER — Encounter: Payer: Self-pay | Admitting: Family Medicine

## 2015-12-17 MED ORDER — ATORVASTATIN CALCIUM 10 MG PO TABS: 10.0000 mg | ORAL_TABLET | Freq: Every day | ORAL | Status: DC

## 2016-01-28 ENCOUNTER — Ambulatory Visit (INDEPENDENT_AMBULATORY_CARE_PROVIDER_SITE_OTHER): Payer: Medicare Other

## 2016-01-28 DIAGNOSIS — E538 Deficiency of other specified B group vitamins: Secondary | ICD-10-CM

## 2016-01-28 MED ORDER — CYANOCOBALAMIN 1000 MCG/ML IJ SOLN
1000.0000 ug | Freq: Once | INTRAMUSCULAR | Status: AC
  Administered 2016-01-28: 1000 ug via INTRAMUSCULAR

## 2016-02-29 ENCOUNTER — Telehealth: Payer: Self-pay | Admitting: Family Medicine

## 2016-02-29 DIAGNOSIS — E785 Hyperlipidemia, unspecified: Secondary | ICD-10-CM

## 2016-02-29 DIAGNOSIS — I10 Essential (primary) hypertension: Secondary | ICD-10-CM

## 2016-02-29 DIAGNOSIS — E538 Deficiency of other specified B group vitamins: Secondary | ICD-10-CM

## 2016-02-29 NOTE — Telephone Encounter (Signed)
-----   Message from Marchia Bond sent at 02/27/2016  4:01 PM EDT ----- Regarding: Cpx labs [place] 4/13, need orders. Thanks! :-) Please order  future cpx labs for pt's upcoming lab appt. Thanks Aniceto Boss

## 2016-03-05 ENCOUNTER — Ambulatory Visit (INDEPENDENT_AMBULATORY_CARE_PROVIDER_SITE_OTHER): Payer: Medicare Other

## 2016-03-05 ENCOUNTER — Other Ambulatory Visit (INDEPENDENT_AMBULATORY_CARE_PROVIDER_SITE_OTHER): Payer: Medicare Other

## 2016-03-05 DIAGNOSIS — E785 Hyperlipidemia, unspecified: Secondary | ICD-10-CM

## 2016-03-05 DIAGNOSIS — I10 Essential (primary) hypertension: Secondary | ICD-10-CM

## 2016-03-05 DIAGNOSIS — E538 Deficiency of other specified B group vitamins: Secondary | ICD-10-CM

## 2016-03-05 LAB — COMPREHENSIVE METABOLIC PANEL
ALT: 14 U/L (ref 0–35)
AST: 16 U/L (ref 0–37)
Albumin: 4.1 g/dL (ref 3.5–5.2)
Alkaline Phosphatase: 58 U/L (ref 39–117)
BUN: 17 mg/dL (ref 6–23)
CO2: 31 mEq/L (ref 19–32)
Calcium: 10.2 mg/dL (ref 8.4–10.5)
Chloride: 106 mEq/L (ref 96–112)
Creatinine, Ser: 0.88 mg/dL (ref 0.40–1.20)
GFR: 65.89 mL/min (ref >60.00)
Glucose, Bld: 95 mg/dL (ref 70–99)
Potassium: 4.6 mEq/L (ref 3.5–5.1)
Sodium: 142 mEq/L (ref 135–145)
Total Bilirubin: 0.6 mg/dL (ref 0.2–1.2)
Total Protein: 6.9 g/dL (ref 6.0–8.3)

## 2016-03-05 LAB — CBC WITH DIFFERENTIAL/PLATELET
Basophils Absolute: 0 10*3/uL (ref 0.0–0.1)
Basophils Relative: 0.7 % (ref 0.0–3.0)
Eosinophils Absolute: 0.2 10*3/uL (ref 0.0–0.7)
Eosinophils Relative: 3.4 % (ref 0.0–5.0)
HCT: 40 % (ref 36.0–46.0)
Hemoglobin: 13.5 g/dL (ref 12.0–15.0)
Lymphocytes Relative: 42.1 % (ref 12.0–46.0)
Lymphs Abs: 2.5 10*3/uL (ref 0.7–4.0)
MCHC: 33.7 g/dL (ref 30.0–36.0)
MCV: 89.6 fl (ref 78.0–100.0)
Monocytes Absolute: 0.5 10*3/uL (ref 0.1–1.0)
Monocytes Relative: 8.9 % (ref 3.0–12.0)
Neutro Abs: 2.7 10*3/uL (ref 1.4–7.7)
Neutrophils Relative %: 44.9 % (ref 43.0–77.0)
Platelets: 290 10*3/uL (ref 150.0–400.0)
RBC: 4.47 Mil/uL (ref 3.87–5.11)
RDW: 15 % (ref 11.5–15.5)
WBC: 6 10*3/uL (ref 4.0–10.5)

## 2016-03-05 LAB — LIPID PANEL
Cholesterol: 203 mg/dL — ABNORMAL HIGH (ref 0–200)
HDL: 68.1 mg/dL (ref >39.00)
LDL Cholesterol: 117 mg/dL — ABNORMAL HIGH (ref 0–99)
NonHDL: 134.67
Total CHOL/HDL Ratio: 3
Triglycerides: 90 mg/dL (ref 0.0–149.0)
VLDL: 18 mg/dL (ref 0.0–40.0)

## 2016-03-05 LAB — VITAMIN B12: Vitamin B-12: 401 pg/mL (ref 211–911)

## 2016-03-05 LAB — TSH: TSH: 0.93 u[IU]/mL (ref 0.35–4.50)

## 2016-03-05 MED ORDER — CYANOCOBALAMIN 1000 MCG/ML IJ SOLN
1000.0000 ug | Freq: Once | INTRAMUSCULAR | Status: AC
  Administered 2016-03-05: 1000 ug via INTRAMUSCULAR

## 2016-03-09 ENCOUNTER — Encounter: Payer: Medicare Other | Admitting: Family Medicine

## 2016-03-10 ENCOUNTER — Ambulatory Visit: Payer: Medicare Other

## 2016-03-15 ENCOUNTER — Other Ambulatory Visit: Payer: Self-pay | Admitting: Family Medicine

## 2016-03-18 ENCOUNTER — Encounter: Payer: Self-pay | Admitting: Family Medicine

## 2016-03-18 ENCOUNTER — Ambulatory Visit (INDEPENDENT_AMBULATORY_CARE_PROVIDER_SITE_OTHER): Payer: Medicare Other | Admitting: Family Medicine

## 2016-03-18 VITALS — BP 126/68 | HR 66 | Temp 98.2°F | Ht 64.25 in | Wt 205.5 lb

## 2016-03-18 DIAGNOSIS — I10 Essential (primary) hypertension: Secondary | ICD-10-CM | POA: Diagnosis not present

## 2016-03-18 DIAGNOSIS — E785 Hyperlipidemia, unspecified: Secondary | ICD-10-CM | POA: Diagnosis not present

## 2016-03-18 DIAGNOSIS — E538 Deficiency of other specified B group vitamins: Secondary | ICD-10-CM | POA: Diagnosis not present

## 2016-03-18 MED ORDER — OMEPRAZOLE 20 MG PO CPDR: 20.0000 mg | DELAYED_RELEASE_CAPSULE | Freq: Every day | ORAL | Status: DC | PRN

## 2016-03-18 MED ORDER — CLORAZEPATE DIPOTASSIUM 7.5 MG PO TABS: 7.5000 mg | ORAL_TABLET | Freq: Every day | ORAL | Status: DC | PRN

## 2016-03-18 NOTE — Progress Notes (Signed)
Pre visit review using our clinic review tool, if applicable. No additional management support is needed unless otherwise documented below in the visit note. 

## 2016-03-18 NOTE — Progress Notes (Signed)
Subjective:    Patient ID: Tammy Manning, female    DOB: [birth year], 81 y.o.   MRN: [id number]  HPI Here for f/u of chronic health problems (and rev of health mt)  Has her AMW with Katha Cabal in May  Wt is down 7 lb with bmi of 35 She is trying to take care of herself  Walking quite a bit - this is hurting her left knee (uses ice)  Will have to see orthopedics again    Mm 7/16 negative Self breast exam- no breast lumps or changes   Flu shot 9/16  Td 4/14  dexa 11/12 normal  No falls or fractures On ca and D  Walking   PNA 10/15- is complete on both vaccines   Colonoscopy was 4/13 - with polyps  Dr Allyn Kenner requests 5 year recall on that  More constipation with age - she uses miralax and drinks a lot of water and eats a lot of vegetables   bp is stable today  No cp or palpitations or headaches or edema  No side effects to medicines  BP Readings from Last 3 Encounters:  03/18/16 126/68  10/11/15 130/70  10/01/15 134/78     Hx of B12 def  Lab Results  Component Value Date   VITAMINB12 401 03/05/2016   Hyperlipidemia Lab Results  Component Value Date   CHOL 203* 03/05/2016   CHOL 211* 02/27/2015   CHOL 227* 02/27/2014   Lab Results  Component Value Date   HDL 68.10 03/05/2016   HDL 70.30 02/27/2015   HDL 76.40 02/27/2014   Lab Results  Component Value Date   LDLCALC 117* 03/05/2016   LDLCALC 124* 02/27/2015   LDLCALC 135* 02/27/2014   Lab Results  Component Value Date   TRIG 90.0 03/05/2016   TRIG 83.0 02/27/2015   TRIG 77.0 02/27/2014   Lab Results  Component Value Date   CHOLHDL 3 03/05/2016   CHOLHDL 3 02/27/2015   CHOLHDL 3 02/27/2014   Lab Results  Component Value Date   LDLDIRECT 105.9 02/08/2013   LDLDIRECT 125.0 09/12/2012   LDLDIRECT 119.3 12/31/2011    On lipitor and diet - tolerates low dose statin only  No beef  Eats low fat  Does eat shrimp  No fried foods or high fat dairy  Patient Active Problem List   Diagnosis Date  Noted  . Angina, class II ([place]) 09/10/2015  . Aortic insufficiency 09/10/2015  . Stress reaction 09/08/2015  . Atypical chest pain 08/30/2015  . Encounter for Medicare annual wellness exam 02/28/2013  . Bucket-handle tear of lateral meniscus of left knee as current injury 12/30/2012  . Palpitations 11/26/2012  . Episodic atrial fibrillation ([place]) 11/17/2012  . Special screening for malignant neoplasms, colon 01/21/2012  . Hearing loss of both ears 01/14/2012  . Adverse drug reaction 03/12/2011  . HEADACHE, CHRONIC 09/24/2010  . B12 deficiency 03/17/2010  . DYSPEPSIA 03/17/2010  . POSTMENOPAUSAL STATUS 08/20/2008  . Essential hypertension 07/19/2007  . Hyperlipidemia 06/24/2007  . CONSTIPATION 06/24/2007  . IBS 06/24/2007  . DERMATITIS, ATOPIC 06/24/2007  . SKIN CANCER, HX OF 06/24/2007   Past Medical History  Diagnosis Date  . Cancer (HCC)     Skin, basal cell on nose and eyelid  . Hyperlipidemia   . Arthritis     Knees  . DJD (degenerative joint disease)     Low back  . Dyspepsia   . B12 deficiency     Mild  . Lactose intolerance   .  Mixed incontinence   . Vulvar irritation     from urine pad, uses Triamcinolone oint.  . Hypertension   . GERD (gastroesophageal reflux disease)   . PONV (postoperative nausea and vomiting)   . Bucket-handle tear of lateral meniscus of left knee as current injury 12/30/2012   Past Surgical History  Procedure Laterality Date  . Abdominal hysterectomy  1982    Large fibroids and bleeding  . Bilateral salpingoophorectomy  1986  . Bone graft hip iliac crest      To Left arm  . Appendectomy    . Breast reduction surgery    . Hemorrhoid surgery  2006  . Esophagogastroduodenoscopy      Polyps  . Skin cancer eyelid  2012  . Tonsillectomy    . Dilation and curettage of uterus      miscarragesx2  . Knee arthroscopy with lateral menisectomy Left 12/30/2012    Procedure: KNEE ARTHROSCOPY WITH LATERAL MENISECTOMY;  Surgeon: Johnny Bridge,  MD;  Location: [facility];  Service: Orthopedics;  Laterality: Left;  LEFT KNEE SCOPE LATERAL MENISCECTOMY   Social History  Substance Use Topics  . Smoking status: Former Smoker -- 1.00 packs/day for 12 years    Types: Cigarettes    Quit date: 11/24/1963  . Smokeless tobacco: Never Used  . Alcohol Use: No     Comment: Rare   Family History  Problem Relation Age of Onset  . Cancer Mother     Colon  . Heart disease Father     CAD  . Diabetes Sister   . Diabetes Brother   . Leukemia Brother   . Cancer Paternal Aunt     Breast  . Diabetes Sister    Allergies  Allergen Reactions  . Amlodipine Besylate Other (See Comments)    Reaction:  Increases BP  . Antihistamines, Chlorpheniramine-Type Other (See Comments)    Reaction:  Makes pt hyper   . Atorvastatin Other (See Comments)    Reaction:  Myalgias   . Cefuroxime Axetil Other (See Comments)    Upset stomach  . Ciprofloxacin Other (See Comments)    Upset stomach  . Co Q 10 [Coenzyme Q10] Other (See Comments)    Reaction:  GI upset   . Crestor [Rosuvastatin Calcium] Other (See Comments)    Reaction:  Myalgias   . Dextromethorphan-Guaifenesin Other (See Comments)    Reaction:  Made pt jittery   . Epinephrine Hives  . Ramipril Other (See Comments)    Upset stomach  . Sulfamethoxazole-Trimethoprim Other (See Comments)    Upset stomach  . Vitamin D Analogs Other (See Comments)    Reaction:  GI upset    Current Outpatient Prescriptions on File Prior to Visit  Medication Sig Dispense Refill  . aspirin EC 81 MG tablet Take 81 mg by mouth daily.    [name] Kitchen atorvastatin (LIPITOR) 10 MG tablet TAKE 1 TABLET(10 MG) BY MOUTH DAILY AT 6 PM 90 tablet 1  . Calcium Carbonate-Vitamin D (CALCIUM 600+D) 600-400 MG-UNIT tablet Take 1 tablet by mouth daily.    . clorazepate (TRANXENE) 7.5 MG tablet Take 1 tablet (7.5 mg total) by mouth daily as needed for anxiety. 30 tablet 0  . fluticasone (FLONASE) 50 MCG/ACT nasal spray SPRAY  TWICE IN EACH NOSTRIL DAILY 48 g 1  . irbesartan (AVAPRO) 150 MG tablet Take 150 mg by mouth daily.    [name] Kitchen omeprazole (PRILOSEC) 20 MG capsule Take 20 mg by mouth daily as needed (heartburn).  No current facility-administered medications on file prior to visit.     Review of Systems    Review of Systems  Constitutional: Negative for fever, appetite change, fatigue and unexpected weight change.  Eyes: Negative for pain and visual disturbance.  Respiratory: Negative for cough and shortness of breath.   Cardiovascular: Negative for cp or palpitations    Gastrointestinal: Negative for nausea, diarrhea and pos for constipation.  Genitourinary: Negative for urgency and frequency.  Skin: Negative for pallor or rash   MSK pos for knee pain Neurological: Negative for weakness, light-headedness, numbness and headaches.  Hematological: Negative for adenopathy. Does not bruise/bleed easily.  Psychiatric/Behavioral: Negative for dysphoric mood. The patient is not nervous/anxious.      Objective:   Physical Exam  Constitutional: She appears well-developed and well-nourished. No distress.  obese and well appearing   HENT:  Head: Normocephalic and atraumatic.  Right Ear: External ear normal.  Left Ear: External ear normal.  Mouth/Throat: Oropharynx is clear and moist.  Clear TMs Has hearing aides  Eyes: Conjunctivae and EOM are normal. Pupils are equal, round, and reactive to light. No scleral icterus.  Neck: Normal range of motion. Neck supple. No JVD present. Carotid bruit is not present. No thyromegaly present.  Cardiovascular: Normal rate, regular rhythm and intact distal pulses.  Exam reveals no gallop.   Murmur heard. Pulmonary/Chest: Effort normal and breath sounds normal. No respiratory distress. She has no wheezes. She exhibits no tenderness.  Abdominal: Soft. Bowel sounds are normal. She exhibits no distension, no abdominal bruit and no mass. There is no tenderness.  Genitourinary:  No breast swelling, tenderness, discharge or bleeding.  Breast exam: No mass, nodules, thickening, tenderness, bulging, retraction, inflamation, nipple discharge or skin changes noted.  No axillary or clavicular LA.     Scars noted from prev breast reduction  Musculoskeletal: Normal range of motion. She exhibits no edema or tenderness.  Lymphadenopathy:    She has no cervical adenopathy.  Neurological: She is alert. She has normal reflexes. No cranial nerve deficit. She exhibits normal muscle tone. Coordination normal.  Skin: Skin is warm and dry. No rash noted. No erythema. No pallor.  Psychiatric: She has a normal mood and affect.          Assessment & Plan:   Problem List Items Addressed This Visit      Cardiovascular and Mediastinum   Essential hypertension - Primary    bp in fair control at this time  BP Readings from Last 1 Encounters:  03/18/16 126/68   No changes needed Disc lifstyle change with low sodium diet and exercise  Labs reviewed Enc further wt loss         Digestive   B12 deficiency    Lab Results  Component Value Date   [id number] 257 03/05/2016   Continue current supplementation        Other   Hyperlipidemia    Disc goals for lipids and reasons to control them Rev labs with pt Rev low sat fat diet in detail Unable to get LDL under 100 in light of intol of higher dose statin  Enc good diet

## 2016-03-18 NOTE — Assessment & Plan Note (Signed)
bp in fair control at this time  BP Readings from Last 1 Encounters:  03/18/16 126/68   No changes needed Disc lifstyle change with low sodium diet and exercise  Labs reviewed Enc further wt loss

## 2016-03-18 NOTE — Assessment & Plan Note (Signed)
Lab Results  Component Value Date   [id number] 030 03/05/2016   Continue current supplementation

## 2016-03-18 NOTE — Assessment & Plan Note (Signed)
Disc goals for lipids and reasons to control them Rev labs with pt Rev low sat fat diet in detail Unable to get LDL under 100 in light of intol of higher dose statin  Enc good diet

## 2016-03-18 NOTE — Patient Instructions (Signed)
Keep up the good work Labs are stable  Do limit shellfish for your cholesterol  Keep drinking water Mix up exercise - low impact (like bike/ water) are better for knees   No change in medicines You can try gas- X for bloating

## 2016-03-23 DIAGNOSIS — M1712 Unilateral primary osteoarthritis, left knee: Secondary | ICD-10-CM | POA: Diagnosis not present

## 2016-04-13 ENCOUNTER — Encounter: Payer: Self-pay | Admitting: Podiatry

## 2016-04-13 ENCOUNTER — Ambulatory Visit (INDEPENDENT_AMBULATORY_CARE_PROVIDER_SITE_OTHER): Payer: Medicare Other

## 2016-04-13 ENCOUNTER — Ambulatory Visit (INDEPENDENT_AMBULATORY_CARE_PROVIDER_SITE_OTHER): Payer: Medicare Other | Admitting: Podiatry

## 2016-04-13 DIAGNOSIS — M779 Enthesopathy, unspecified: Secondary | ICD-10-CM | POA: Diagnosis not present

## 2016-04-13 DIAGNOSIS — Q6650 Congenital pes planus, unspecified foot: Secondary | ICD-10-CM

## 2016-04-13 MED ORDER — TRIAMCINOLONE ACETONIDE 10 MG/ML IJ SUSP
10.0000 mg | Freq: Once | INTRAMUSCULAR | Status: AC
  Administered 2016-04-13: 10 mg

## 2016-04-14 ENCOUNTER — Ambulatory Visit (INDEPENDENT_AMBULATORY_CARE_PROVIDER_SITE_OTHER): Payer: Medicare Other

## 2016-04-14 VITALS — BP 158/80 | HR 65 | Temp 98.5°F | Ht 64.0 in | Wt 207.5 lb

## 2016-04-14 DIAGNOSIS — Z0000 Encounter for general adult medical examination without abnormal findings: Secondary | ICD-10-CM | POA: Diagnosis not present

## 2016-04-14 DIAGNOSIS — E538 Deficiency of other specified B group vitamins: Secondary | ICD-10-CM | POA: Diagnosis not present

## 2016-04-14 MED ORDER — CYANOCOBALAMIN 1000 MCG/ML IJ SOLN
1000.0000 ug | Freq: Once | INTRAMUSCULAR | Status: AC
  Administered 2016-04-14: 1000 ug via INTRAMUSCULAR

## 2016-04-14 NOTE — Progress Notes (Signed)
   Subjective:    Patient ID: Tammy Manning, female    DOB: 1935/04/23, 81 y.o.   MRN: [id number]  HPI    Review of Systems     Objective:   Physical Exam        Assessment & Plan:  I reviewed health advisor's note, was available for consultation, and agree with documentation and plan.

## 2016-04-14 NOTE — Progress Notes (Signed)
PCP notes:  Health maintenance: No gaps identified or addressed.  Abnormal screenings: None  Patient concerns: None  Nurse concerns:   Pt's BP was elevated. PCP notified.   B12 injection given as pt stated it had been 6 wks since last administration.  Next PCP appt: 05/12/16 @ 1145

## 2016-04-14 NOTE — Progress Notes (Signed)
Subjective:     Patient ID: Tammy Manning, female   DOB: Jul 03, 1935, 81 y.o.   MRN: [id number]  HPI patient presents stating she's developed discomfort in the arches of both feet and that also she has developed discomfort in the sinus tarsi left on the lateral side with inflammation and fluid of the joint surface. Patient has moderate depression of the arch noted   Review of Systems  All other systems reviewed and are negative.      Objective:   Physical Exam  Constitutional: She is oriented to person, place, and time.  Cardiovascular: Intact distal pulses.   Musculoskeletal: Normal range of motion.  Neurological: She is oriented to person, place, and time.  Skin: Skin is warm.  Nursing note and vitals reviewed.  neurovascular status intact muscle strength adequate range of motion within normal limits with patient found to have discomfort in the sinus tarsi left over right with inflammation fluid around the joint surface. Patient does have discomfort in the arch bilateral that's moderate when palpated and is found to have good digital perfusion and is well oriented 3     Assessment:     Sinus tarsitis left over right with inflammation fluid with mild fasciitis which might be creating compensatory pain    Plan:     H&P and x-rays reviewed. Injected the sinus tarsi left 3 mg Kenalog 5 mg Xylocaine and dispensed a fascial brace to elevate the arch and instructed on physical therapy. Reviewed x-rays with patient and at this time she will be seen back in 2 weeks or earlier if needed  Trays indicated there is moderate depression of the arch with mild arthritis but no indications of stress fracture

## 2016-04-14 NOTE — Progress Notes (Signed)
Subjective:   Tammy Manning is a 81 y.o. female who presents for Medicare Annual (Subsequent) preventive examination.  Review of Systems:  N/A Cardiac Risk Factors include: advanced age (>35mn, >>24women);hypertension;dyslipidemia     Objective:     Vitals: BP 158/80 mmHg  Pulse 65  Temp(Src) 98.5 F (36.9 C) (Oral)  Ht 5' 4"  (1.626 m)  Wt 207 lb 8 oz (94.121 kg)  BMI 35.60 kg/m2  SpO2 96%  Body mass index is 35.6 kg/(m^2).   Tobacco History  Smoking status  . Former Smoker -- 1.00 packs/day for 12 years  . Types: Cigarettes  . Quit date: 11/24/1963  Smokeless tobacco  . Never Used     Counseling given: Not Answered   Past Medical History  Diagnosis Date  . Cancer (HCC)     Skin, basal cell on nose and eyelid  . Hyperlipidemia   . Arthritis     Knees  . DJD (degenerative joint disease)     Low back  . Dyspepsia   . B12 deficiency     Mild  . Lactose intolerance   . Mixed incontinence   . Vulvar irritation     from urine pad, uses Triamcinolone oint.  . Hypertension   . GERD (gastroesophageal reflux disease)   . PONV (postoperative nausea and vomiting)   . Bucket-handle tear of lateral meniscus of left knee as current injury 12/30/2012   Past Surgical History  Procedure Laterality Date  . Abdominal hysterectomy  1982    Large fibroids and bleeding  . Bilateral salpingoophorectomy  1986  . Bone graft hip iliac crest      To Left arm  . Appendectomy    . Breast reduction surgery    . Hemorrhoid surgery  2006  . Esophagogastroduodenoscopy      Polyps  . Skin cancer eyelid  2012  . Tonsillectomy    . Dilation and curettage of uterus      miscarragesx2  . Knee arthroscopy with lateral menisectomy Left 12/30/2012    Procedure: KNEE ARTHROSCOPY WITH LATERAL MENISECTOMY;  Surgeon: JJohnny Bridge MD;  Location[place] [facility]  Service: Orthopedics;  Laterality: Left;  LEFT KNEE SCOPE LATERAL MENISCECTOMY   Family History  Problem  Relation Age of Onset  . Cancer Mother     Colon  . Heart disease Father     CAD  . Diabetes Sister   . Diabetes Brother   . Leukemia Brother   . Cancer Paternal Aunt     Breast  . Diabetes Sister    History  Sexual Activity  . Sexual Activity: No    Outpatient Encounter Prescriptions as of 04/14/2016  Medication Sig  . aspirin EC 81 MG tablet Take 81 mg by mouth daily.  .[name] Kitchenatorvastatin (LIPITOR) 10 MG tablet TAKE 1 TABLET(10 MG) BY MOUTH DAILY AT 6 PM  . Calcium Carbonate-Vitamin D (CALCIUM 600+D) 600-400 MG-UNIT tablet Take 1 tablet by mouth daily.  . clorazepate (TRANXENE) 7.5 MG tablet Take 1 tablet (7.5 mg total) by mouth daily as needed for anxiety.  . fluticasone (FLONASE) 50 MCG/ACT nasal spray SPRAY TWICE IN EACH NOSTRIL DAILY  . irbesartan (AVAPRO) 150 MG tablet Take 150 mg by mouth daily.  .[name] Kitchenomeprazole (PRILOSEC) 20 MG capsule Take 1 capsule (20 mg total) by mouth daily as needed (heartburn).  . [EXPIRED] cyanocobalamin ((VITAMIN B-12)) injection 1,000 mcg    No facility-administered encounter medications on file as of 04/14/2016.    Activities  of Daily Living In your present state of health, do you have any difficulty performing the following activities: 04/14/2016 08/30/2015  Hearing? Y N  Vision? N N  Difficulty concentrating or making decisions? N N  Walking or climbing stairs? N N  Dressing or bathing? N N  Doing errands, shopping? N N  Preparing Food and eating ? N -  Using the Toilet? N -  In the past six months, have you accidently leaked urine? N -  Do you have problems with loss of bowel control? N -  Managing your Medications? N -  Managing your Finances? N -  Housekeeping or managing your Housekeeping? N -    Patient Care Team: Abner Greenspan, MD as PCP - General Susa Day, MD (Orthopedic Surgery) Richmond Campbell, MD (Gastroenterology)    Assessment:    Hearing Screening Comments: Wears bilateral hearing aids  Vision Screening Comments:  Last [facility] exam in May 2016 at [facility]; next appt scheduled for 04/15/16  Exercise Activities and Dietary recommendations Current Exercise Habits: Home exercise routine, Type of exercise: walking, Time (Minutes): 20, Frequency (Times/Week): 7, Weekly Exercise (Minutes/Week): 140, Intensity: Mild, Exercise limited by: None identified  Goals    . Increase physical activity     Starting 04/14/2016, I will continue to walk at least 2 miles daily.    [name] Kitchen LDL CALC < 130      Fall Risk Fall Risk  04/14/2016 03/06/2015 03/05/2014 02/28/2013  Falls in the past year? No No No No   Depression Screen PHQ 2/9 Scores 04/14/2016 03/06/2015 03/05/2014 02/28/2013  PHQ - 2 Score 0 0 0 0     Cognitive Testing MMSE - Mini Mental State Exam 04/14/2016  Orientation to time 5  Orientation to Place 5  Registration 3  Attention/ Calculation 0  Recall 3  Language- name 2 objects 0  Language- repeat 1  Language- follow 3 step command 3  Language- read & follow direction 0  Write a sentence 0  Copy design 0  Total score 20   PLEASE NOTE: A Mini-Cog screen was completed. Maximum score is 20. A value of 0 denotes this part of [place] MMSE was not completed or the patient failed this part of the Mini-Cog screening.   Mini-Cog Screening Orientation to Time - Max 5 pts Orientation to Place - Max 5 pts Registration - Max 3 pts Recall - Max 3 pts Language Repeat - Max 1 pts Language Follow 3 Step Command - Max 3 pts  Immunization History  Administered Date(s) Administered  . Influenza Split 07/24/2011, 08/23/2014  . Influenza Whole 08/23/2004, 07/24/2009, 08/06/2010, 08/05/2012  . Influenza,inj,Quad PF,36+ Mos 08/16/2013, 08/07/2015  . Pneumococcal Conjugate-13 08/24/2014  . Pneumococcal Polysaccharide-23 08/20/2008  . Td 03/20/2003, 02/28/2013  . Zoster 09/04/2015   Screening Tests Health Maintenance  Topic Date Due  . MAMMOGRAM  06/05/2016  . INFLUENZA VACCINE  06/23/2016  . TETANUS/TDAP   03/01/2023  . DEXA SCAN  Completed  . ZOSTAVAX  Completed  . PNA vac Low Risk Adult  Completed      Plan:     I have personally reviewed and addressed the Medicare Annual Wellness questionnaire and have noted the following in the patient's chart:  A. Medical and social history B. Use of alcohol, tobacco or illicit drugs  C. Current medications and supplements D. Functional ability and status E.  Nutritional status F.  Physical activity G. Advance directives H. List of other physicians I.  Hospitalizations, surgeries, and  ER visits in previous 12 months J.  [place] to include hearing, vision, cognitive, depression L. Referrals and appointments - none  In addition, I have reviewed and discussed with patient certain preventive protocols, quality metrics, and best practice recommendations. A written personalized care plan for preventive services as well as general preventive health recommendations were provided to patient.  See attached scanned questionnaire for additional information.   Signed,   Lindell Noe, MHA, BS, LPN Health Advisor

## 2016-04-14 NOTE — Progress Notes (Signed)
Pre visit review using our clinic review tool, if applicable. No additional management support is needed unless otherwise documented below in the visit note. 

## 2016-04-14 NOTE — Patient Instructions (Signed)
Tammy Manning , Thank you for taking time to come for your Medicare Wellness Visit. I appreciate your ongoing commitment to your health goals. Please review the following plan we discussed and let me know if I can assist you in the future.   These are the goals we discussed: Goals    . Increase physical activity     Starting 04/14/2016, I will continue to walk at least 2 miles daily.    [name] Kitchen LDL CALC < 130       This is a list of the screening recommended for you and due dates:  Health Maintenance  Topic Date Due  . Mammogram  06/05/2016  . Flu Shot  06/23/2016  . Tetanus Vaccine  03/01/2023  . DEXA scan (bone density measurement)  Completed  . Shingles Vaccine  Completed  . Pneumonia vaccines  Completed    Preventive Care for Adults  A healthy lifestyle and preventive care can promote health and wellness. Preventive health guidelines for adults include the following key practices.  . A routine yearly physical is a good way to check with your health care provider about your health and preventive screening. It is a chance to share any concerns and updates on your health and to receive a thorough exam.  . Visit your dentist for a routine exam and preventive care every 6 months. Brush your teeth twice a day and floss once a day. Good oral hygiene prevents tooth decay and gum disease.  . The frequency of eye exams is based on your age, health, family medical history, use  of contact lenses, and other factors. Follow your health care provider's ecommendations for frequency of eye exams.  . Eat a healthy diet. Foods like vegetables, fruits, whole grains, low-fat dairy products, and lean protein foods contain the nutrients you need without too many calories. Decrease your intake of foods high in solid fats, added sugars, and salt. Eat the right amount of calories for you. Get information about a proper diet from your health care provider, if necessary.  . Regular physical exercise is one of  the most important things you can do for your health. Most adults should get at least 150 minutes of moderate-intensity exercise (any activity that increases your heart rate and causes you to sweat) each week. In addition, most adults need muscle-strengthening exercises on 2 or more days a week.  Silver Sneakers may be a benefit available to you. To determine eligibility, you may visit the website: www.silversneakers.com or contact program at 709-628-5536 Mon-Fri between 8AM-8PM.   . Maintain a healthy weight. The body mass index (BMI) is a screening tool to identify possible weight problems. It provides an estimate of body fat based on height and weight. Your health care provider can find your BMI and can help you achieve or maintain a healthy weight.   For adults 20 years and older: ? A BMI below 18.5 is considered underweight. ? A BMI of 18.5 to 24.9 is normal. ? A BMI of 25 to 29.9 is considered overweight. ? A BMI of 30 and above is considered obese.   . Maintain normal blood lipids and cholesterol levels by exercising and minimizing your intake of saturated fat. Eat a balanced diet with plenty of fruit and vegetables. Blood tests for lipids and cholesterol should begin at age 54 and be repeated every 5 years. If your lipid or cholesterol levels are high, you are over 50, or you are at high risk for heart disease, you may  need your cholesterol levels checked more frequently. Ongoing high lipid and cholesterol levels should be treated with medicines if diet and exercise are not working.  . If you smoke, find out from your health care provider how to quit. If you do not use tobacco, please do not start.  . If you choose to drink alcohol, please do not consume more than 2 drinks per day. One drink is considered to be 12 ounces (355 mL) of beer, 5 ounces (148 mL) of wine, or 1.5 ounces (44 mL) of liquor.  . If you are 44-3 years old, ask your health care provider if you should take aspirin to  prevent strokes.  . Use sunscreen. Apply sunscreen liberally and repeatedly throughout the day. You should seek shade when your shadow is shorter than you. Protect yourself by wearing long sleeves, pants, a wide-brimmed hat, and sunglasses year round, whenever you are outdoors.  . Once a month, do a whole body skin exam, using a mirror to look at the skin on your back. Tell your health care provider of new moles, moles that have irregular borders, moles that are larger than a pencil eraser, or moles that have changed in shape or color.

## 2016-04-15 DIAGNOSIS — H2512 Age-related nuclear cataract, left eye: Secondary | ICD-10-CM | POA: Diagnosis not present

## 2016-04-16 ENCOUNTER — Ambulatory Visit: Payer: Medicare Other

## 2016-04-25 ENCOUNTER — Other Ambulatory Visit: Payer: Self-pay | Admitting: Family Medicine

## 2016-04-27 ENCOUNTER — Encounter: Payer: Self-pay | Admitting: Podiatry

## 2016-04-27 ENCOUNTER — Ambulatory Visit (INDEPENDENT_AMBULATORY_CARE_PROVIDER_SITE_OTHER): Payer: Medicare Other | Admitting: Podiatry

## 2016-04-27 ENCOUNTER — Encounter: Payer: Self-pay | Admitting: Family Medicine

## 2016-04-27 DIAGNOSIS — M779 Enthesopathy, unspecified: Secondary | ICD-10-CM | POA: Diagnosis not present

## 2016-04-27 DIAGNOSIS — Q6650 Congenital pes planus, unspecified foot: Secondary | ICD-10-CM | POA: Diagnosis not present

## 2016-04-29 NOTE — Progress Notes (Signed)
Subjective:     Patient ID: Tammy Manning, female   DOB: [birth year], 81 y.o.   MRN: [id number]  HPI patient states my foot feels quite a bit better with discomfort if I walk too long but improved   Review of Systems     Objective:   Physical Exam Neurovascular status intact muscle strength adequate with diminishment of discomfort in the left forefoot and heel and sinus tarsi with better range of motion of the ankle and subtalar joint    Assessment:     Doing well post injection treatment for chronic pain    Plan:     Reviewed condition and recommended physical therapy anti-inflammatories and continued supportive shoe. Patient will be seen back to recheck

## 2016-05-12 ENCOUNTER — Encounter: Payer: Self-pay | Admitting: Family Medicine

## 2016-05-12 ENCOUNTER — Ambulatory Visit (INDEPENDENT_AMBULATORY_CARE_PROVIDER_SITE_OTHER): Payer: Medicare Other | Admitting: Family Medicine

## 2016-05-12 VITALS — BP 135/65 | HR 67 | Temp 98.5°F | Ht 64.25 in | Wt 207.8 lb

## 2016-05-12 DIAGNOSIS — I10 Essential (primary) hypertension: Secondary | ICD-10-CM | POA: Diagnosis not present

## 2016-05-12 NOTE — Progress Notes (Signed)
Pre visit review using our clinic review tool, if applicable. No additional management support is needed unless otherwise documented below in the visit note. 

## 2016-05-12 NOTE — Assessment & Plan Note (Signed)
This is overall stable Suspect home cuff is inaccurate Given inst re: proper way to check bp at home Disc DASH diet and need for wt loss Continue to follow

## 2016-05-12 NOTE — Patient Instructions (Signed)
Blood pressure is fairly stable on 2nd check today  ? If your cuff is not accurate  Take a look at the handout regarding how to take an accurate bp  Avoid excess sodium and processed foods  Stay active

## 2016-05-12 NOTE — Progress Notes (Signed)
Subjective:    Patient ID: Tammy Manning, female    DOB: 06-Feb-1935, 81 y.o.   MRN: [id number]  HPI Here for f/u of HTN /chronic medical problems   Also has a bump inside lower lip - looks like "a vein" Does not hurt or bother her  Has bitten the inside of her mouth in the past   Has been feeling pretty good  Had a short trip to the beach   Stress level is more calm- ok overall   BP Readings from Last 3 Encounters:  05/12/16 146/68  04/14/16 158/80  03/18/16 126/68  it fluctuates As a rule- does not get headaches No edema or other symptoms No cp or sob   Better on 2nd check BP: 135/65 mmHg    Her cuff today from home reads higher at 172/73 [place] cuff   Hx of mild aortic insuff followed by cardiology Also episodic a fib  Was briefly px toprol Now on avapro daily    Chemistry      Component Value Date/Time   NA 142 03/05/2016 0932   NA 142 11/15/2012 0158   K 4.6 03/05/2016 0932   K 4.2 11/15/2012 0158   CL 106 03/05/2016 0932   CL 112* 11/15/2012 0158   CO2 31 03/05/2016 0932   CO2 25 11/15/2012 0158   BUN 17 03/05/2016 0932   BUN 19* 11/15/2012 0158   CREATININE 0.88 03/05/2016 0932   CREATININE 0.72 11/15/2012 0158      Component Value Date/Time   CALCIUM 10.2 03/05/2016 0932   CALCIUM 8.7 11/15/2012 0158   ALKPHOS 58 03/05/2016 0932   ALKPHOS 62 11/15/2012 0158   AST 16 03/05/2016 0932   AST 21 11/15/2012 0158   ALT 14 03/05/2016 0932   ALT 16 11/15/2012 0158   BILITOT 0.6 03/05/2016 0932   BILITOT 0.3 11/15/2012 0158       Wt is stable with bmi of 35  Patient Active Problem List   Diagnosis Date Noted  . Angina, class II ([place]) 09/10/2015  . Aortic insufficiency 09/10/2015  . Stress reaction 09/08/2015  . Atypical chest pain 08/30/2015  . Encounter for Medicare annual wellness exam 02/28/2013  . Bucket-handle tear of lateral meniscus of left knee as current injury 12/30/2012  . Palpitations 11/26/2012  . Episodic atrial fibrillation  ([place]) 11/17/2012  . Special screening for malignant neoplasms, colon 01/21/2012  . Hearing loss of both ears 01/14/2012  . Adverse drug reaction 03/12/2011  . HEADACHE, CHRONIC 09/24/2010  . B12 deficiency 03/17/2010  . DYSPEPSIA 03/17/2010  . POSTMENOPAUSAL STATUS 08/20/2008  . Essential hypertension 07/19/2007  . Hyperlipidemia 06/24/2007  . CONSTIPATION 06/24/2007  . IBS 06/24/2007  . DERMATITIS, ATOPIC 06/24/2007  . SKIN CANCER, HX OF 06/24/2007   Past Medical History  Diagnosis Date  . Cancer (HCC)     Skin, basal cell on nose and eyelid  . Hyperlipidemia   . Arthritis     Knees  . DJD (degenerative joint disease)     Low back  . Dyspepsia   . B12 deficiency     Mild  . Lactose intolerance   . Mixed incontinence   . Vulvar irritation     from urine pad, uses Triamcinolone oint.  . Hypertension   . GERD (gastroesophageal reflux disease)   . PONV (postoperative nausea and vomiting)   . Bucket-handle tear of lateral meniscus of left knee as current injury 12/30/2012   Past Surgical History  Procedure Laterality Date  .  Abdominal hysterectomy  1982    Large fibroids and bleeding  . Bilateral salpingoophorectomy  1986  . Bone graft hip iliac crest      To Left arm  . Appendectomy    . Breast reduction surgery    . Hemorrhoid surgery  2006  . Esophagogastroduodenoscopy      Polyps  . Skin cancer eyelid  2012  . Tonsillectomy    . Dilation and curettage of uterus      miscarragesx2  . Knee arthroscopy with lateral menisectomy Left 12/30/2012    Procedure: KNEE ARTHROSCOPY WITH LATERAL MENISECTOMY;  Surgeon: Johnny Bridge, MD;  Location: [facility];  Service: Orthopedics;  Laterality: Left;  LEFT KNEE SCOPE LATERAL MENISCECTOMY   Social History  Substance Use Topics  . Smoking status: Former Smoker -- 1.00 packs/day for 12 years    Types: Cigarettes    Quit date: 11/24/1963  . Smokeless tobacco: Never Used  . Alcohol Use: No     Comment: Rare    Family History  Problem Relation Age of Onset  . Cancer Mother     Colon  . Heart disease Father     CAD  . Diabetes Sister   . Diabetes Brother   . Leukemia Brother   . Cancer Paternal Aunt     Breast  . Diabetes Sister    Allergies  Allergen Reactions  . Amlodipine Besylate Other (See Comments)    Reaction:  Increases BP  . Antihistamines, Chlorpheniramine-Type Other (See Comments)    Reaction:  Makes pt hyper   . Atorvastatin Other (See Comments)    Reaction:  Myalgias   . Cefuroxime Axetil Other (See Comments)    Upset stomach  . Ciprofloxacin Other (See Comments)    Upset stomach  . Co Q 10 [Coenzyme Q10] Other (See Comments)    Reaction:  GI upset   . Crestor [Rosuvastatin Calcium] Other (See Comments)    Reaction:  Myalgias   . Dextromethorphan-Guaifenesin Other (See Comments)    Reaction:  Made pt jittery   . Epinephrine Hives  . Ramipril Other (See Comments)    Upset stomach  . Sulfamethoxazole-Trimethoprim Other (See Comments)    Upset stomach  . Vitamin D Analogs Other (See Comments)    Reaction:  GI upset    Current Outpatient Prescriptions on File Prior to Visit  Medication Sig Dispense Refill  . aspirin EC 81 MG tablet Take 81 mg by mouth daily.    [name] Kitchen atorvastatin (LIPITOR) 10 MG tablet TAKE 1 TABLET(10 MG) BY MOUTH DAILY AT 6 PM 90 tablet 1  . Calcium Carbonate-Vitamin D (CALCIUM 600+D) 600-400 MG-UNIT tablet Take 1 tablet by mouth daily.    . clorazepate (TRANXENE) 7.5 MG tablet Take 1 tablet (7.5 mg total) by mouth daily as needed for anxiety. 30 tablet 0  . fluticasone (FLONASE) 50 MCG/ACT nasal spray SPRAY TWICE IN EACH NOSTRIL DAILY 48 g 1  . irbesartan (AVAPRO) 150 MG tablet TAKE 1 TABLET BY MOUTH EVERY DAY 90 tablet 1  . omeprazole (PRILOSEC) 20 MG capsule Take 1 capsule (20 mg total) by mouth daily as needed (heartburn). 30 capsule 11   No current facility-administered medications on file prior to visit.     Review of Systems    Review  of Systems  Constitutional: Negative for fever, appetite change, fatigue and unexpected weight change.  ENT pos for lesion in mouth  Eyes: Negative for pain and visual disturbance.  Respiratory: Negative for cough  and shortness of breath.   Cardiovascular: Negative for cp or palpitations    Gastrointestinal: Negative for nausea, diarrhea and constipation.  Genitourinary: Negative for urgency and frequency.  Skin: Negative for pallor or rash   Neurological: Negative for weakness, light-headedness, numbness and headaches.  Hematological: Negative for adenopathy. Does not bruise/bleed easily.  Psychiatric/Behavioral: Negative for dysphoric mood. The patient is not nervous/anxious.      Objective:   Physical Exam  Constitutional: She appears well-developed and well-nourished. No distress.  obese and well appearing   HENT:  Head: Normocephalic and atraumatic.  Mouth/Throat: Oropharynx is clear and moist.  Soft lesion seen inside inner lip resembling varicosity (blue in color) or mucocele  nontender   Eyes: Conjunctivae and EOM are normal. Pupils are equal, round, and reactive to light.  Neck: Normal range of motion. Neck supple. No JVD present. Carotid bruit is not present. No thyromegaly present.  Cardiovascular: Normal rate, regular rhythm and intact distal pulses.  Exam reveals no gallop.   Murmur heard. Soft end systolic M  Pulmonary/Chest: Effort normal and breath sounds normal. No respiratory distress. She has no wheezes. She has no rales.  No crackles  Abdominal: Soft. Bowel sounds are normal. She exhibits no distension, no abdominal bruit and no mass. There is no tenderness.  Musculoskeletal: She exhibits no edema.  Lymphadenopathy:    She has no cervical adenopathy.  Neurological: She is alert. She has normal reflexes. No cranial nerve deficit. She exhibits normal muscle tone. Coordination normal.  Skin: Skin is warm and dry. No rash noted.  Psychiatric: She has a normal mood  and affect.          Assessment & Plan:   Problem List Items Addressed This Visit      Cardiovascular and Mediastinum   Essential hypertension - Primary    This is overall stable Suspect home cuff is inaccurate Given inst re: proper way to check bp at home Disc DASH diet and need for wt loss Continue to follow

## 2016-05-28 ENCOUNTER — Ambulatory Visit (INDEPENDENT_AMBULATORY_CARE_PROVIDER_SITE_OTHER): Payer: Medicare Other | Admitting: *Deleted

## 2016-05-28 DIAGNOSIS — E538 Deficiency of other specified B group vitamins: Secondary | ICD-10-CM | POA: Diagnosis not present

## 2016-05-28 MED ORDER — CYANOCOBALAMIN 1000 MCG/ML IJ SOLN
1000.0000 ug | Freq: Once | INTRAMUSCULAR | Status: AC
  Administered 2016-05-28: 1000 ug via INTRAMUSCULAR

## 2016-06-10 ENCOUNTER — Ambulatory Visit: Payer: Medicare Other | Admitting: Family Medicine

## 2016-06-23 DIAGNOSIS — K5900 Constipation, unspecified: Secondary | ICD-10-CM | POA: Diagnosis not present

## 2016-06-30 DIAGNOSIS — H2512 Age-related nuclear cataract, left eye: Secondary | ICD-10-CM | POA: Diagnosis not present

## 2016-07-02 DIAGNOSIS — D126 Benign neoplasm of colon, unspecified: Secondary | ICD-10-CM | POA: Diagnosis not present

## 2016-07-02 DIAGNOSIS — D124 Benign neoplasm of descending colon: Secondary | ICD-10-CM | POA: Diagnosis not present

## 2016-07-02 DIAGNOSIS — K5730 Diverticulosis of large intestine without perforation or abscess without bleeding: Secondary | ICD-10-CM | POA: Diagnosis not present

## 2016-07-02 DIAGNOSIS — K635 Polyp of colon: Secondary | ICD-10-CM | POA: Diagnosis not present

## 2016-07-02 DIAGNOSIS — Z86010 Personal history of colonic polyps: Secondary | ICD-10-CM | POA: Diagnosis not present

## 2016-07-02 DIAGNOSIS — K648 Other hemorrhoids: Secondary | ICD-10-CM | POA: Diagnosis not present

## 2016-07-02 DIAGNOSIS — Z8719 Personal history of other diseases of the digestive system: Secondary | ICD-10-CM | POA: Diagnosis not present

## 2016-07-02 DIAGNOSIS — K449 Diaphragmatic hernia without obstruction or gangrene: Secondary | ICD-10-CM | POA: Diagnosis not present

## 2016-07-02 DIAGNOSIS — K219 Gastro-esophageal reflux disease without esophagitis: Secondary | ICD-10-CM | POA: Diagnosis not present

## 2016-07-02 DIAGNOSIS — K317 Polyp of stomach and duodenum: Secondary | ICD-10-CM | POA: Diagnosis not present

## 2016-07-02 DIAGNOSIS — K641 Second degree hemorrhoids: Secondary | ICD-10-CM | POA: Diagnosis not present

## 2016-07-02 DIAGNOSIS — Z800 Family history of malignant neoplasm of digestive organs: Secondary | ICD-10-CM | POA: Diagnosis not present

## 2016-07-06 ENCOUNTER — Encounter: Payer: Self-pay | Admitting: *Deleted

## 2016-07-07 ENCOUNTER — Ambulatory Visit
Admission: RE | Admit: 2016-07-07 | Discharge: 2016-07-07 | Disposition: A | Discharge status: Home / Self Care | Payer: Medicare Other | Source: Ambulatory Visit | Attending: Ophthalmology | Admitting: Ophthalmology

## 2016-07-07 ENCOUNTER — Ambulatory Visit: Payer: Medicare Other | Admitting: Anesthesiology

## 2016-07-07 ENCOUNTER — Encounter
Admission: RE | Disposition: A | Discharge status: Home / Self Care | Payer: Self-pay | Source: Ambulatory Visit | Attending: Ophthalmology

## 2016-07-07 ENCOUNTER — Encounter: Payer: Self-pay | Admitting: Anesthesiology

## 2016-07-07 DIAGNOSIS — Z87891 Personal history of nicotine dependence: Secondary | ICD-10-CM | POA: Diagnosis not present

## 2016-07-07 DIAGNOSIS — I209 Angina pectoris, unspecified: Secondary | ICD-10-CM | POA: Insufficient documentation

## 2016-07-07 DIAGNOSIS — E7800 Pure hypercholesterolemia, unspecified: Secondary | ICD-10-CM | POA: Insufficient documentation

## 2016-07-07 DIAGNOSIS — Z7982 Long term (current) use of aspirin: Secondary | ICD-10-CM | POA: Diagnosis not present

## 2016-07-07 DIAGNOSIS — Z85828 Personal history of other malignant neoplasm of skin: Secondary | ICD-10-CM | POA: Diagnosis not present

## 2016-07-07 DIAGNOSIS — Z79899 Other long term (current) drug therapy: Secondary | ICD-10-CM | POA: Diagnosis not present

## 2016-07-07 DIAGNOSIS — R011 Cardiac murmur, unspecified: Secondary | ICD-10-CM | POA: Diagnosis not present

## 2016-07-07 DIAGNOSIS — M1990 Unspecified osteoarthritis, unspecified site: Secondary | ICD-10-CM | POA: Diagnosis not present

## 2016-07-07 DIAGNOSIS — R51 Headache: Secondary | ICD-10-CM | POA: Insufficient documentation

## 2016-07-07 DIAGNOSIS — Z888 Allergy status to other drugs, medicaments and biological substances status: Secondary | ICD-10-CM | POA: Diagnosis not present

## 2016-07-07 DIAGNOSIS — H2512 Age-related nuclear cataract, left eye: Secondary | ICD-10-CM | POA: Insufficient documentation

## 2016-07-07 DIAGNOSIS — F419 Anxiety disorder, unspecified: Secondary | ICD-10-CM | POA: Insufficient documentation

## 2016-07-07 DIAGNOSIS — I10 Essential (primary) hypertension: Secondary | ICD-10-CM | POA: Diagnosis not present

## 2016-07-07 DIAGNOSIS — K5790 Diverticulosis of intestine, part unspecified, without perforation or abscess without bleeding: Secondary | ICD-10-CM | POA: Diagnosis not present

## 2016-07-07 DIAGNOSIS — H9193 Unspecified hearing loss, bilateral: Secondary | ICD-10-CM | POA: Insufficient documentation

## 2016-07-07 DIAGNOSIS — K219 Gastro-esophageal reflux disease without esophagitis: Secondary | ICD-10-CM | POA: Insufficient documentation

## 2016-07-07 DIAGNOSIS — R002 Palpitations: Secondary | ICD-10-CM | POA: Diagnosis not present

## 2016-07-07 DIAGNOSIS — Z90710 Acquired absence of both cervix and uterus: Secondary | ICD-10-CM | POA: Insufficient documentation

## 2016-07-07 HISTORY — DX: Angina pectoris, unspecified: I20.9

## 2016-07-07 HISTORY — PX: CATARACT EXTRACTION W/PHACO: SHX586

## 2016-07-07 HISTORY — DX: Unspecified hearing loss, unspecified ear: H91.90

## 2016-07-07 HISTORY — DX: Palpitations: R00.2

## 2016-07-07 HISTORY — DX: Cardiac murmur, unspecified: R01.1

## 2016-07-07 SURGERY — PHACOEMULSIFICATION, CATARACT, WITH IOL INSERTION
Anesthesia: Monitor Anesthesia Care | Site: Eye | Laterality: Left | Wound class: Clean

## 2016-07-07 MED ORDER — TETRACAINE HCL 0.5 % OP SOLN
1.0000 [drp] | Freq: Once | OPHTHALMIC | Status: AC
  Administered 2016-07-07: 1 [drp] via OPHTHALMIC

## 2016-07-07 MED ORDER — ARMC OPHTHALMIC DILATING GEL
OPHTHALMIC | Status: AC
  Administered 2016-07-07: 1 via OPHTHALMIC
  Filled 2016-07-07: qty 0.25

## 2016-07-07 MED ORDER — POVIDONE-IODINE 5 % OP SOLN
1.0000 "application " | Freq: Once | OPHTHALMIC | Status: AC
  Administered 2016-07-07: 1 via OPHTHALMIC

## 2016-07-07 MED ORDER — TETRACAINE HCL 0.5 % OP SOLN
OPHTHALMIC | Status: AC
  Administered 2016-07-07: 1 [drp] via OPHTHALMIC
  Filled 2016-07-07: qty 2

## 2016-07-07 MED ORDER — ONDANSETRON HCL 4 MG/2ML IJ SOLN: 4.0000 mg | Freq: Once | INTRAMUSCULAR | Status: DC | PRN

## 2016-07-07 MED ORDER — ARMC OPHTHALMIC DILATING GEL
1.0000 "application " | OPHTHALMIC | Status: AC | PRN
  Administered 2016-07-07 (×2): 1 via OPHTHALMIC

## 2016-07-07 MED ORDER — NA CHONDROIT SULF-NA HYALURON 40-17 MG/ML IO SOLN
INTRAOCULAR | Status: AC
  Filled 2016-07-07: qty 1

## 2016-07-07 MED ORDER — FENTANYL CITRATE (PF) 100 MCG/2ML IJ SOLN: 25.0000 ug | INTRAMUSCULAR | Status: DC | PRN

## 2016-07-07 MED ORDER — SODIUM CHLORIDE 0.9 % IV SOLN
INTRAVENOUS | Status: DC
  Administered 2016-07-07 (×2): via INTRAVENOUS

## 2016-07-07 MED ORDER — CARBACHOL 0.01 % IO SOLN
INTRAOCULAR | Status: DC | PRN
  Administered 2016-07-07: .5 mL via INTRAOCULAR

## 2016-07-07 MED ORDER — POVIDONE-IODINE 5 % OP SOLN
OPHTHALMIC | Status: AC
  Administered 2016-07-07: 1 via OPHTHALMIC
  Filled 2016-07-07: qty 30

## 2016-07-07 MED ORDER — MOXIFLOXACIN HCL 0.5 % OP SOLN
OPHTHALMIC | Status: DC | PRN
  Administered 2016-07-07: 1 [drp] via OPHTHALMIC

## 2016-07-07 MED ORDER — MIDAZOLAM HCL 2 MG/2ML IJ SOLN
INTRAMUSCULAR | Status: DC | PRN
  Administered 2016-07-07: 1 mg via INTRAVENOUS

## 2016-07-07 MED ORDER — NA CHONDROIT SULF-NA HYALURON 40-17 MG/ML IO SOLN
INTRAOCULAR | Status: DC | PRN
  Administered 2016-07-07: 1 mL via INTRAOCULAR

## 2016-07-07 MED ORDER — MOXIFLOXACIN HCL 0.5 % OP SOLN
OPHTHALMIC | Status: AC
  Filled 2016-07-07: qty 3

## 2016-07-07 MED ORDER — MOXIFLOXACIN HCL 0.5 % OP SOLN: 1.0000 [drp] | OPHTHALMIC | Status: DC | PRN

## 2016-07-07 MED ORDER — LIDOCAINE HCL (PF) 1 % IJ SOLN
INTRAMUSCULAR | Status: AC
  Filled 2016-07-07: qty 2

## 2016-07-07 MED ORDER — CEFUROXIME OPHTHALMIC INJECTION 1 MG/0.1 ML
INJECTION | OPHTHALMIC | Status: DC | PRN
  Administered 2016-07-07: .1 mL via INTRACAMERAL

## 2016-07-07 MED ORDER — CEFUROXIME OPHTHALMIC INJECTION 1 MG/0.1 ML
INJECTION | OPHTHALMIC | Status: AC
  Filled 2016-07-07: qty 0.1

## 2016-07-07 MED ORDER — EPINEPHRINE HCL 1 MG/ML IJ SOLN
INTRAMUSCULAR | Status: DC | PRN
  Administered 2016-07-07: 08:00:00 via OPHTHALMIC

## 2016-07-07 SURGICAL SUPPLY — 22 items
CANNULA ANT/CHMB 27G (MISCELLANEOUS) ×1 IMPLANT
CANNULA ANT/CHMB 27GA (MISCELLANEOUS) ×2 IMPLANT
CUP MEDICINE 2OZ PLAST GRAD ST (MISCELLANEOUS) ×2 IMPLANT
GLOVE BIO SURGEON STRL SZ8 (GLOVE) ×2 IMPLANT
GLOVE BIOGEL M 6.5 STRL (GLOVE) ×2 IMPLANT
GLOVE SURG LX 8.0 MICRO (GLOVE) ×1
GLOVE SURG LX STRL 8.0 MICRO (GLOVE) ×1 IMPLANT
GOWN STRL REUS W/ TWL LRG LVL3 (GOWN DISPOSABLE) ×2 IMPLANT
GOWN STRL REUS W/TWL LRG LVL3 (GOWN DISPOSABLE) ×4
LENS IOL TECNIS ITEC 23.0 (Intraocular Lens) ×1 IMPLANT
PACK CATARACT (MISCELLANEOUS) ×2 IMPLANT
PACK CATARACT BRASINGTON LX (MISCELLANEOUS) ×2 IMPLANT
PACK EYE AFTER SURG (MISCELLANEOUS) ×2 IMPLANT
SOL BSS BAG (MISCELLANEOUS) ×2
SOL PREP PVP 2OZ (MISCELLANEOUS) ×2
SOLUTION BSS BAG (MISCELLANEOUS) ×1 IMPLANT
SOLUTION PREP PVP 2OZ (MISCELLANEOUS) ×1 IMPLANT
SYR 3ML LL SCALE MARK (SYRINGE) ×2 IMPLANT
SYR 5ML LL (SYRINGE) ×2 IMPLANT
SYR TB 1ML 27GX1/2 LL (SYRINGE) ×2 IMPLANT
WATER STERILE IRR 1000ML POUR (IV SOLUTION) ×2 IMPLANT
WIPE NON LINTING 3.25X3.25 (MISCELLANEOUS) ×2 IMPLANT

## 2016-07-07 NOTE — Anesthesia Procedure Notes (Signed)
Procedure Name: MAC Date/Time: 07/07/2016 8:11 AM Performed by: Nelda Marseille Pre-anesthesia Checklist: Patient identified, Emergency Drugs available, Suction available, Patient being monitored and Timeout performed

## 2016-07-07 NOTE — Anesthesia Preprocedure Evaluation (Signed)
Anesthesia Evaluation  Patient identified by MRN, date of birth, ID band Patient awake    Reviewed: Allergy & Precautions, NPO status , Patient's Chart, lab work & pertinent test results  History of Anesthesia Complications (+) PONV and history of anesthetic complications  Airway Mallampati: II  TM Distance: >3 FB     Dental no notable dental hx.    Pulmonary former smoker,    Pulmonary exam normal        Cardiovascular hypertension, Pt. on medications + angina with exertion Normal cardiovascular exam+ Valvular Problems/Murmurs      Neuro/Psych  Headaches, PSYCHIATRIC DISORDERS Anxiety    GI/Hepatic Neg liver ROS, GERD  Medicated,  Endo/Other  negative endocrine ROS  Renal/GU negative Renal ROS  negative genitourinary   Musculoskeletal  (+) Arthritis , Osteoarthritis,    Abdominal Normal abdominal exam  (+)   Peds negative pediatric ROS (+)  Hematology negative hematology ROS (+)   Anesthesia Other Findings   Reproductive/Obstetrics                             Anesthesia Physical Anesthesia Plan  ASA: III  Anesthesia Plan: MAC   Post-op Pain Management:    Induction: Intravenous  Airway Management Planned: Nasal Cannula  Additional Equipment:   Intra-op Plan:   Post-operative Plan:   Informed Consent: I have reviewed the patients History and Physical, chart, labs and discussed the procedure including the risks, benefits and alternatives for the proposed anesthesia with the patient or authorized representative who has indicated his/her understanding and acceptance.   Dental advisory given  Plan Discussed with: CRNA and Surgeon  Anesthesia Plan Comments:         Anesthesia Quick Evaluation

## 2016-07-07 NOTE — Transfer of Care (Signed)
Immediate Anesthesia Transfer of Care Note  Patient: Tammy Manning  Procedure(s) Performed: Procedure(s) with comments: CATARACT EXTRACTION PHACO AND INTRAOCULAR LENS PLACEMENT (IOC) (Left) - [place] 01:10 AP% 18.3 CDE 12.91 Fluid pak lot # [id number]  Patient Location: PACU  Anesthesia Type:MAC  Level of Consciousness: awake, alert  and oriented  Airway & Oxygen Therapy: Patient Spontanous Breathing  Post-op Assessment: Report given to RN and Post -op Vital signs reviewed and stable  Post vital signs: Reviewed and stable  Last Vitals:  Vitals:   07/07/16 0716  BP: (!) (P) 174/73  Pulse: (P) 63  Resp: (P) 20  Temp: (P) 37 C    Last Pain:  Vitals:   07/07/16 0716  TempSrc: (P) Oral         Complications: No apparent anesthesia complications

## 2016-07-07 NOTE — Anesthesia Postprocedure Evaluation (Signed)
Anesthesia Post Note  Patient: Tammy Manning  Procedure(s) Performed: Procedure(s) (LRB): CATARACT EXTRACTION PHACO AND INTRAOCULAR LENS PLACEMENT (IOC) (Left)  Patient location during evaluation: PACU Anesthesia Type: MAC Level of consciousness: awake, awake and alert and oriented Pain management: pain level controlled Vital Signs Assessment: post-procedure vital signs reviewed and stable Respiratory status: spontaneous breathing Cardiovascular status: stable and blood pressure returned to baseline    Last Vitals:  Vitals:   07/07/16 0716  BP: (!) (P) 174/73  Pulse: (P) 63  Resp: (P) 20  Temp: (P) 37 C    Last Pain:  Vitals:   07/07/16 0716  TempSrc: (P) Oral                 Mikena Masoner,  Kamare Caspers R

## 2016-07-07 NOTE — Discharge Instructions (Signed)
Eye Surgery Discharge Instructions  Expect mild scratchy sensation or mild soreness. DO NOT RUB YOUR EYE!  The day of surgery:  Minimal physical activity, but bed rest is not required  No reading, computer work, or close hand work  No bending, lifting, or straining.  May watch TV  For 24 hours:  No driving, legal decisions, or alcoholic beverages  Safety precautions  Eat anything you prefer: It is better to start with liquids, then soup then solid foods.  _____ Eye patch should be worn until postoperative exam tomorrow.  ____ Solar shield eyeglasses should be worn for comfort in the sunlight/patch while sleeping  Resume all regular medications including aspirin or Coumadin if these were discontinued prior to surgery. You may shower, bathe, shave, or wash your hair. Tylenol may be taken for mild discomfort.  Call your doctor if you experience significant pain, nausea, or vomiting, fever > 101 or other signs of infection. [phone] or [phone] Specific instructions:  Follow-up Information    PORFILIO,WILLIAM LOUIS, MD Follow up on 07/08/2016.   Specialty:  Ophthalmology Why:  9:10 Contact information: [address] [place] [address] [phone]

## 2016-07-07 NOTE — Op Note (Signed)
PREOPERATIVE DIAGNOSIS:  Nuclear sclerotic cataract of the left eye.   POSTOPERATIVE DIAGNOSIS:  nuclear sclerotic cataract left eye   OPERATIVE PROCEDURE:  Procedure(s): CATARACT EXTRACTION PHACO AND INTRAOCULAR LENS PLACEMENT (IOC)   SURGEON:  Birder Robson, MD.   ANESTHESIA:   Anesthesiologist: Alvin Critchley, MD CRNA: Nelda Marseille, CRNA  1.      Managed anesthesia care. 2.      Topical tetracaine drops followed by 2% Xylocaine jelly applied in the preoperative holding area.   COMPLICATIONS:  None.   TECHNIQUE:   Stop and chop   DESCRIPTION OF PROCEDURE:  The patient was examined and consented in the preoperative holding area where the aforementioned topical anesthesia was applied to the left eye and then brought back to the Operating Room where the left eye was prepped and draped in the usual sterile ophthalmic fashion and a lid speculum was placed. A paracentesis was created with the side port blade and the anterior chamber was filled with viscoelastic. A near clear corneal incision was performed with the steel keratome. A continuous curvilinear capsulorrhexis was performed with a cystotome followed by the capsulorrhexis forceps. Hydrodissection and hydrodelineation were carried out with BSS on a blunt cannula. The lens was removed in a stop and chop  technique and the remaining cortical material was removed with the irrigation-aspiration handpiece. The capsular bag was inflated with viscoelastic and the Technis [id number] lens was placed in the capsular bag without complication. The remaining viscoelastic was removed from the eye with the irrigation-aspiration handpiece. The wounds were hydrated. The anterior chamber was flushed with Miostat and the eye was inflated to physiologic pressure. 0.1 mL of cefuroxime concentration 10 mg/mL was placed in the anterior chamber. The wounds were found to be water tight. The eye was dressed with Vigamox. The patient was given protective glasses to wear  throughout the day and a shield with which to sleep tonight. The patient was also given drops with which to begin a drop regimen today and will follow-up with me in one day.  Implant Name Type Inv. Item Serial No. Manufacturer Lot No. LRB No. Used  LENS IOL DIOP 23.0 - [id number] Intraocular Lens LENS IOL DIOP 23.0 [id number]   Left 1   Procedure(s) with comments: CATARACT EXTRACTION PHACO AND INTRAOCULAR LENS PLACEMENT (IOC) (Left) - [place] 01:10 AP% 18.3 CDE 12.91 Fluid pak lot # [id number]  Electronically signed: [employer] 07/07/2016 8:22 AM

## 2016-07-07 NOTE — H&P (Signed)
  All labs reviewed. Abnormal studies sent to patients PCP when indicated.  Previous H&P reviewed, patient examined, there are NO CHANGES.  Tammy Almario LOUIS8/15/20177:54 AM

## 2016-07-09 ENCOUNTER — Ambulatory Visit (INDEPENDENT_AMBULATORY_CARE_PROVIDER_SITE_OTHER): Payer: Medicare Other

## 2016-07-09 DIAGNOSIS — E538 Deficiency of other specified B group vitamins: Secondary | ICD-10-CM

## 2016-07-09 MED ORDER — CYANOCOBALAMIN 1000 MCG/ML IJ SOLN
1000.0000 ug | Freq: Once | INTRAMUSCULAR | Status: AC
  Administered 2016-07-09: 1000 ug via INTRAMUSCULAR

## 2016-07-17 ENCOUNTER — Other Ambulatory Visit: Payer: Self-pay | Admitting: Family Medicine

## 2016-07-17 DIAGNOSIS — H2511 Age-related nuclear cataract, right eye: Secondary | ICD-10-CM | POA: Diagnosis not present

## 2016-07-17 NOTE — Telephone Encounter (Signed)
Px written for call in   

## 2016-07-17 NOTE — Telephone Encounter (Signed)
Rx called in as prescribed 

## 2016-07-17 NOTE — Telephone Encounter (Signed)
Pt has CPE scheduled on 04/19/16, last filled on 03/18/16 #30 with 0 refill, please advise

## 2016-07-20 ENCOUNTER — Telehealth: Payer: Self-pay | Admitting: Cardiovascular Disease

## 2016-07-20 DIAGNOSIS — Z1231 Encounter for screening mammogram for malignant neoplasm of breast: Secondary | ICD-10-CM | POA: Diagnosis not present

## 2016-07-20 NOTE — Telephone Encounter (Signed)
S/w pt who reports palpitations and heart skipping beats on a daily basis for the past few weeks. She thinks it might be related to the stress of upcoming cataract surgery but is unsure. It happens at different times of the day, no matter if  resting or exerting energy. She would like to be evaluated before 9/5 cataract surgery and is agreeable to 8/29 appt @ 2pm.

## 2016-07-20 NOTE — Telephone Encounter (Signed)
Pt calling stating she is having palpitations and skipping feeling. This is been going on about two weeks Also has gone through a lot over the summer. Not sure if its just anxiety   Just feeling a bit tired  Please advise

## 2016-07-21 ENCOUNTER — Other Ambulatory Visit: Payer: Self-pay | Admitting: Cardiovascular Disease

## 2016-07-21 ENCOUNTER — Encounter: Payer: Self-pay | Admitting: Cardiovascular Disease

## 2016-07-21 ENCOUNTER — Encounter: Payer: Self-pay | Admitting: *Deleted

## 2016-07-21 ENCOUNTER — Ambulatory Visit (INDEPENDENT_AMBULATORY_CARE_PROVIDER_SITE_OTHER): Payer: Medicare Other | Admitting: Cardiovascular Disease

## 2016-07-21 VITALS — BP 154/72 | HR 76 | Ht 64.0 in | Wt 210.2 lb

## 2016-07-21 DIAGNOSIS — I498 Other specified cardiac arrhythmias: Secondary | ICD-10-CM | POA: Diagnosis not present

## 2016-07-21 DIAGNOSIS — I351 Nonrheumatic aortic (valve) insufficiency: Secondary | ICD-10-CM | POA: Diagnosis not present

## 2016-07-21 DIAGNOSIS — I10 Essential (primary) hypertension: Secondary | ICD-10-CM

## 2016-07-21 DIAGNOSIS — E785 Hyperlipidemia, unspecified: Secondary | ICD-10-CM

## 2016-07-21 MED ORDER — METOPROLOL SUCCINATE ER 25 MG PO TB24: 25.0000 mg | ORAL_TABLET | Freq: Every day | ORAL | 5 refills | Status: DC

## 2016-07-21 NOTE — Progress Notes (Signed)
Cardiology Office Note   Date:  07/21/2016   ID:  Tammy Manning, DOB 11-19-1935, MRN [id number]  PCP:  Tammy Pardon, MD  Cardiologist:   Tammy Sacramento, MD   Chief Complaint  Patient presents with  . Other    12 month follow up. Meds reviewed by the patient verbally. Pt. c/o fluttering in chest.       History of Present Illness: Tammy Manning is a 81 y.o. female who presents for a follow-up visit regarding chest pain and mild aortic insufficiency. She was seen by me in the past for palpitations. She has known history of hypertension and osteoarthritis.  She was hospitalized at [facility] in October, 2016 for chest pain. she underwent a pharmacologic nuclear stress test done which showed normal perfusion and ejection fraction.  Her symptoms were felt to be due to stress.  Echocardiogram in November 2016 showed normal LV systolic function, grade 1 diastolic dysfunction, mild aortic regurgitation, mild mitral regurgitation and normal pulmonary pressure. She has been under increased stress recently. She is scheduled to have cataract surgery she been having pain in her left ankle. She noticed increased skipping and palpitations recently without chest pain or shortness of breath. No syncope or presyncope.  Past Medical History:  Diagnosis Date  . Anginal pain ([place])   . Arthritis    Knees  . B12 deficiency    Mild  . Bucket-handle tear of lateral meniscus of left knee as current injury 12/30/2012  . Cancer ([facility])    Skin, basal cell on nose and eyelid  . DJD (degenerative joint disease)    Low back  . Dyspepsia   . GERD (gastroesophageal reflux disease)   . Heart murmur   . Heart palpitations   . HOH (hard of hearing)   . Hyperlipidemia   . Hypertension   . Lactose intolerance   . Mixed incontinence   . PONV (postoperative nausea and vomiting)   . Vulvar irritation    from urine pad, uses Triamcinolone oint.    Past Surgical History:  Procedure Laterality Date  . ABDOMINAL  HYSTERECTOMY  1982   Large fibroids and bleeding  . APPENDECTOMY    . BILATERAL SALPINGOOPHORECTOMY  1986  . BONE GRAFT HIP ILIAC CREST     To Left arm  . BREAST REDUCTION SURGERY    . BREAST SURGERY    . CATARACT EXTRACTION W/PHACO Left 07/07/2016   Procedure: CATARACT EXTRACTION PHACO AND INTRAOCULAR LENS PLACEMENT (IOC);  Surgeon: Birder Robson, MD;  Location: [facility] ORS;  Service: Ophthalmology;  Laterality: Left;  [place] 01:10 AP% 18.3 CDE 12.91 Fluid pak lot # [id number]  . DILATION AND CURETTAGE OF UTERUS     miscarragesx2  . ESOPHAGOGASTRODUODENOSCOPY     Polyps  . [place]  2006  . KNEE ARTHROSCOPY WITH LATERAL MENISECTOMY Left 12/30/2012   Procedure: KNEE ARTHROSCOPY WITH LATERAL MENISECTOMY;  Surgeon: Johnny Bridge, MD;  Location: [facility];  Service: Orthopedics;  Laterality: Left;  LEFT KNEE SCOPE LATERAL MENISCECTOMY  . MOUTH SURGERY    . skin cancer eyelid  2012  . TONSILLECTOMY       Current Outpatient Prescriptions  Medication Sig Dispense Refill  . aspirin EC 81 MG tablet Take 81 mg by mouth daily.    [name] Kitchen atorvastatin (LIPITOR) 10 MG tablet TAKE 1 TABLET(10 MG) BY MOUTH DAILY AT 6 PM 90 tablet 1  . Calcium Carbonate-Vitamin D (CALCIUM 600+D PO) Take 1 tablet by mouth. TAKES 1200 T D  500    . clorazepate (TRANXENE) 7.5 MG tablet TAKE 1 TABLET BY MOUTH EVERY DAY AS NEEDED FOR ANXIETY 30 tablet 0  . irbesartan (AVAPRO) 150 MG tablet TAKE 1 TABLET BY MOUTH EVERY DAY (Patient taking differently: TAKE 1 TABLET BY MOUTH BID) 90 tablet 1  . omeprazole (PRILOSEC) 20 MG capsule Take 1 capsule (20 mg total) by mouth daily as needed (heartburn). 30 capsule 11  . metoprolol succinate (TOPROL-XL) 25 MG 24 hr tablet Take 1 tablet (25 mg total) by mouth daily. Take with or immediately following a meal. 30 tablet 5   No current facility-administered medications for this visit.     Allergies:   Amlodipine besylate; Antihistamines, chlorpheniramine-type;  Atorvastatin; Cefuroxime axetil; Ciprofloxacin; Co q 10 [coenzyme q10]; Crestor [rosuvastatin calcium]; Dextromethorphan-guaifenesin; Epinephrine; Ramipril; Sulfamethoxazole-trimethoprim; and Vitamin d analogs    Social History:  The patient  reports that she quit smoking about 52 years ago. Her smoking use included Cigarettes. She has a 12.00 pack-year smoking history. She has never used smokeless tobacco. She reports that she does not drink alcohol or use drugs.   Family History:  The patient's family history includes Cancer in her mother and paternal aunt; Diabetes in her brother, sister, and sister; Heart disease in her father; Leukemia in her brother.    ROS:  Please see the history of present illness.   Otherwise, review of systems are positive for none.   All other systems are reviewed and negative.    PHYSICAL EXAM: VS:  BP (!) 154/72 (BP Location: Left Arm, Patient Position: Sitting, Cuff Size: Normal)   Pulse 76   Ht 5' 4"  (1.626 m)   Wt 210 lb 4 oz (95.4 kg)   BMI 36.09 kg/m  , BMI Body mass index is 36.09 kg/m. GEN: Well nourished, well developed, in no acute distress  HEENT: normal  Neck: no JVD, carotid bruits, or masses Cardiac: RRR; no  rubs, or gallops,no edema . 2/6 decrescendo diastolic murmur at the aortic area and left sternal border. Respiratory:  clear to auscultation bilaterally, normal work of breathing GI: soft, nontender, nondistended, + BS MS: no deformity or atrophy  Skin: warm and dry, no rash Neuro:  Strength and sensation are intact Psych: euthymic mood, full affect Distal pulses are normal.   EKG:  EKG is ordered today. The ekg ordered today demonstrates normal sinus rhythm with no significant ST or T wave changes. Borderline left ventricular hypertrophy.   Recent Labs: 03/05/2016: ALT 14; BUN 17; Creatinine, Ser 0.88; Hemoglobin 13.5; Platelets 290.0; Potassium 4.6; Sodium 142; TSH 0.93    Lipid Panel    Component Value Date/Time   CHOL 203  (H) 03/05/2016 0932   TRIG 90.0 03/05/2016 0932   HDL 68.10 03/05/2016 0932   CHOLHDL 3 03/05/2016 0932   VLDL 18.0 03/05/2016 0932   LDLCALC 117 (H) 03/05/2016 0932   LDLDIRECT 105.9 02/08/2013 1050      Wt Readings from Last 3 Encounters:  07/21/16 210 lb 4 oz (95.4 kg)  07/06/16 205 lb (93 kg)  05/12/16 207 lb 12.8 oz (94.3 kg)       No flowsheet data found.    ASSESSMENT AND PLAN:  1.  Palpitations: Her symptoms are suggestive of premature beats likely triggered by stress and anxiety. She had labs done in April which were unremarkable including thyroid function. She did have palpitations in the past and was evaluated with a 30 day monitor in 2014 which showed no significant arrhythmia. EKG is  unremarkable. I elected to start a small dose Toprol especially that her blood pressure is elevated.  2. Aortic insufficiency: This was mild on most recent echocardiogram. She continues to have a diastolic aortic murmur.  3. Essential hypertension: She reports very high blood pressure readings recently and usually this is in the setting of stress. I think she would benefit from being on a beta blocker and thus I started Toprol 25 mg once daily.  4. Hyperlipidemia: Currently she is on atorvastatin 10 mg daily.    Disposition:   FU with me in 6 months  Signed,  Tammy Sacramento, MD  07/21/2016 2:25 PM    [place]

## 2016-07-21 NOTE — Patient Instructions (Signed)
Medication Instructions:  Your physician has recommended you make the following change in your medication:  START taking metoprolol 2m once daily   Labwork: none  Testing/Procedures: none  Follow-Up: Your physician wants you to follow-up in: six months with Dr. AFletcher Anon  You will receive a reminder letter in the mail two months in advance. If you don't receive a letter, please call our office to schedule the follow-up appointment.   Any Other Special Instructions Will Be Listed Below (If Applicable).     If you need a refill on your cardiac medications before your next appointment, please call your pharmacy.

## 2016-07-23 ENCOUNTER — Encounter: Payer: Self-pay | Admitting: Podiatry

## 2016-07-23 ENCOUNTER — Encounter: Payer: Self-pay | Admitting: Family Medicine

## 2016-07-23 ENCOUNTER — Ambulatory Visit (INDEPENDENT_AMBULATORY_CARE_PROVIDER_SITE_OTHER): Payer: Medicare Other | Admitting: Podiatry

## 2016-07-23 DIAGNOSIS — M722 Plantar fascial fibromatosis: Secondary | ICD-10-CM | POA: Diagnosis not present

## 2016-07-23 DIAGNOSIS — Q6650 Congenital pes planus, unspecified foot: Secondary | ICD-10-CM | POA: Diagnosis not present

## 2016-07-23 DIAGNOSIS — M79673 Pain in unspecified foot: Secondary | ICD-10-CM

## 2016-07-23 DIAGNOSIS — M779 Enthesopathy, unspecified: Secondary | ICD-10-CM

## 2016-07-23 NOTE — Progress Notes (Signed)
Subjective: 81 year old female presents the office they for follow-up evaluation and continued pain in her left ankle and her foot. She states that when she walks she has pain in the outside aspect of the foot and she points on the sinus tarsi. That she is also having pain the bottom of her heel which is been intermittent in nature. She gets pain in the morning which she gets up to her heel and she also gets more pain to the outside aspect of her foot with more walking. Denies any recent injury or trauma. Denies any systemic complaints such as fevers, chills, nausea, vomiting. No acute changes since last appointment, and no other complaints at this time.   Objective: AAO x3, NAD DP/PT pulses palpable bilaterally, CRT less than 3 seconds At this time there is no tenderness on lateral aspect of the sinus tarsi of the left foot appears no pain with subtalar joint range of motion there is no resection. Ankle joint range of motion.Tenderness to palpation along the plantar medial tubercle of the calcaneus at the insertion of plantar fascia on the left foot. There is no pain along the course of the plantar fascia within the arch of the foot. Plantar fascia appears to be intact. There is no pain with lateral compression of the calcaneus or pain with vibratory sensation. There is no pain along the course or insertion of the achilles tendon. No other areas of tenderness to bilateral lower extremities.No edema, erythema, increase in warmth to bilateral lower extremities.  No open lesions or pre-ulcerative lesions.  No pain with calf compression, swelling, warmth, erythema  Assessment: Left subtalar joint capsulitis, plantar fasciitis  Plan: -All treatment options discussed with the patient including all alternatives, risks, complications.  -Patient elects to proceed with steroid injection into the left heel. Under sterile skin preparation, a total of 2.5cc of kenalog 10, 0.5% Marcaine plain, and 2% lidocaine  plain were infiltrated into the symptomatic area without complication. A band-aid was applied. Patient tolerated the injection well without complication. Post-injection care with discussed with the patient. Discussed with the patient to ice the area over the next couple of days to help prevent a steroid flare. We'll hold off on subtalar joint injection today she's not having pain. -Stretching and icing exercises daily -Recommended inserts. -Follow-up 4 weeks or sooner if he did.  -Patient encouraged to call the office with any questions, concerns, change in symptoms.

## 2016-07-23 NOTE — Patient Instructions (Signed)

## 2016-07-24 HISTORY — PX: COLONOSCOPY: SHX174

## 2016-07-28 ENCOUNTER — Ambulatory Visit: Payer: Medicare Other | Admitting: Anesthesiology

## 2016-07-28 ENCOUNTER — Encounter
Admission: RE | Disposition: A | Discharge status: Home / Self Care | Payer: Self-pay | Source: Ambulatory Visit | Attending: Ophthalmology

## 2016-07-28 ENCOUNTER — Encounter: Payer: Self-pay | Admitting: Anesthesiology

## 2016-07-28 ENCOUNTER — Ambulatory Visit
Admission: RE | Admit: 2016-07-28 | Discharge: 2016-07-28 | Disposition: A | Discharge status: Home / Self Care | Payer: Medicare Other | Source: Ambulatory Visit | Attending: Ophthalmology | Admitting: Ophthalmology

## 2016-07-28 DIAGNOSIS — Z90710 Acquired absence of both cervix and uterus: Secondary | ICD-10-CM | POA: Insufficient documentation

## 2016-07-28 DIAGNOSIS — M1990 Unspecified osteoarthritis, unspecified site: Secondary | ICD-10-CM | POA: Diagnosis not present

## 2016-07-28 DIAGNOSIS — Z7982 Long term (current) use of aspirin: Secondary | ICD-10-CM | POA: Insufficient documentation

## 2016-07-28 DIAGNOSIS — K219 Gastro-esophageal reflux disease without esophagitis: Secondary | ICD-10-CM | POA: Diagnosis not present

## 2016-07-28 DIAGNOSIS — H9190 Unspecified hearing loss, unspecified ear: Secondary | ICD-10-CM | POA: Diagnosis not present

## 2016-07-28 DIAGNOSIS — H2511 Age-related nuclear cataract, right eye: Secondary | ICD-10-CM | POA: Diagnosis not present

## 2016-07-28 DIAGNOSIS — I10 Essential (primary) hypertension: Secondary | ICD-10-CM | POA: Insufficient documentation

## 2016-07-28 DIAGNOSIS — E785 Hyperlipidemia, unspecified: Secondary | ICD-10-CM | POA: Diagnosis not present

## 2016-07-28 DIAGNOSIS — Z79899 Other long term (current) drug therapy: Secondary | ICD-10-CM | POA: Insufficient documentation

## 2016-07-28 DIAGNOSIS — R002 Palpitations: Secondary | ICD-10-CM | POA: Insufficient documentation

## 2016-07-28 DIAGNOSIS — Z87891 Personal history of nicotine dependence: Secondary | ICD-10-CM | POA: Diagnosis not present

## 2016-07-28 DIAGNOSIS — R011 Cardiac murmur, unspecified: Secondary | ICD-10-CM | POA: Insufficient documentation

## 2016-07-28 DIAGNOSIS — Z85828 Personal history of other malignant neoplasm of skin: Secondary | ICD-10-CM | POA: Insufficient documentation

## 2016-07-28 DIAGNOSIS — K5790 Diverticulosis of intestine, part unspecified, without perforation or abscess without bleeding: Secondary | ICD-10-CM | POA: Diagnosis not present

## 2016-07-28 DIAGNOSIS — E7800 Pure hypercholesterolemia, unspecified: Secondary | ICD-10-CM | POA: Insufficient documentation

## 2016-07-28 DIAGNOSIS — Z888 Allergy status to other drugs, medicaments and biological substances status: Secondary | ICD-10-CM | POA: Diagnosis not present

## 2016-07-28 DIAGNOSIS — I209 Angina pectoris, unspecified: Secondary | ICD-10-CM | POA: Insufficient documentation

## 2016-07-28 HISTORY — PX: CATARACT EXTRACTION W/PHACO: SHX586

## 2016-07-28 SURGERY — PHACOEMULSIFICATION, CATARACT, WITH IOL INSERTION
Anesthesia: Monitor Anesthesia Care | Site: Eye | Laterality: Right | Wound class: Clean

## 2016-07-28 MED ORDER — BSS IO SOLN
INTRAOCULAR | Status: DC | PRN
  Administered 2016-07-28: 11:00:00 via OPHTHALMIC

## 2016-07-28 MED ORDER — MOXIFLOXACIN HCL 0.5 % OP SOLN
OPHTHALMIC | Status: DC | PRN
  Administered 2016-07-28: 1 [drp] via OPHTHALMIC

## 2016-07-28 MED ORDER — POVIDONE-IODINE 5 % OP SOLN
OPHTHALMIC | Status: AC
  Filled 2016-07-28: qty 30

## 2016-07-28 MED ORDER — TETRACAINE HCL 0.5 % OP SOLN
OPHTHALMIC | Status: AC
  Filled 2016-07-28: qty 2

## 2016-07-28 MED ORDER — ARMC OPHTHALMIC DILATING GEL
OPHTHALMIC | Status: AC
  Filled 2016-07-28: qty 0.25

## 2016-07-28 MED ORDER — CARBACHOL 0.01 % IO SOLN
INTRAOCULAR | Status: DC | PRN
  Administered 2016-07-28: .5 mL via INTRAOCULAR

## 2016-07-28 MED ORDER — SODIUM CHLORIDE 0.9 % IV SOLN
INTRAVENOUS | Status: DC
  Administered 2016-07-28 (×2): via INTRAVENOUS

## 2016-07-28 MED ORDER — MIDAZOLAM HCL 5 MG/5ML IJ SOLN
INTRAMUSCULAR | Status: DC | PRN
  Administered 2016-07-28: 1 mg via INTRAVENOUS

## 2016-07-28 MED ORDER — CEFUROXIME OPHTHALMIC INJECTION 1 MG/0.1 ML
INJECTION | OPHTHALMIC | Status: AC
  Filled 2016-07-28: qty 0.1

## 2016-07-28 MED ORDER — METOPROLOL TARTRATE 5 MG/5ML IV SOLN
INTRAVENOUS | Status: DC | PRN
  Administered 2016-07-28: 2.5 mg via INTRAVENOUS

## 2016-07-28 MED ORDER — HYDRALAZINE HCL 20 MG/ML IJ SOLN
INTRAMUSCULAR | Status: DC | PRN
  Administered 2016-07-28: 10 mg via INTRAVENOUS

## 2016-07-28 MED ORDER — TETRACAINE HCL 0.5 % OP SOLN
1.0000 [drp] | Freq: Once | OPHTHALMIC | Status: AC
  Administered 2016-07-28: 1 [drp] via OPHTHALMIC

## 2016-07-28 MED ORDER — NA CHONDROIT SULF-NA HYALURON 40-17 MG/ML IO SOLN
INTRAOCULAR | Status: DC | PRN
  Administered 2016-07-28: 1 mL via INTRAOCULAR

## 2016-07-28 MED ORDER — NA CHONDROIT SULF-NA HYALURON 40-17 MG/ML IO SOLN
INTRAOCULAR | Status: AC
  Filled 2016-07-28: qty 1

## 2016-07-28 MED ORDER — POVIDONE-IODINE 5 % OP SOLN
1.0000 "application " | Freq: Once | OPHTHALMIC | Status: AC
  Administered 2016-07-28: 1 via OPHTHALMIC

## 2016-07-28 MED ORDER — CEFUROXIME OPHTHALMIC INJECTION 1 MG/0.1 ML
INJECTION | OPHTHALMIC | Status: DC | PRN
  Administered 2016-07-28: .1 mL via INTRACAMERAL

## 2016-07-28 MED ORDER — FENTANYL CITRATE (PF) 100 MCG/2ML IJ SOLN
INTRAMUSCULAR | Status: DC | PRN
  Administered 2016-07-28: 50 ug via INTRAVENOUS

## 2016-07-28 MED ORDER — ARMC OPHTHALMIC DILATING GEL
1.0000 "application " | OPHTHALMIC | Status: DC | PRN
  Administered 2016-07-28: 1 via OPHTHALMIC

## 2016-07-28 MED ORDER — MOXIFLOXACIN HCL 0.5 % OP SOLN: 1.0000 [drp] | OPHTHALMIC | Status: AC | PRN

## 2016-07-28 MED ORDER — MOXIFLOXACIN HCL 0.5 % OP SOLN
OPHTHALMIC | Status: AC
  Filled 2016-07-28: qty 3

## 2016-07-28 SURGICAL SUPPLY — 22 items
CANNULA ANT/CHMB 27G (MISCELLANEOUS) ×1 IMPLANT
CANNULA ANT/CHMB 27GA (MISCELLANEOUS) ×2 IMPLANT
CUP MEDICINE 2OZ PLAST GRAD ST (MISCELLANEOUS) ×2 IMPLANT
GLOVE BIO SURGEON STRL SZ8 (GLOVE) ×2 IMPLANT
GLOVE BIOGEL M 6.5 STRL (GLOVE) ×2 IMPLANT
GLOVE SURG LX 8.0 MICRO (GLOVE) ×1
GLOVE SURG LX STRL 8.0 MICRO (GLOVE) ×1 IMPLANT
GOWN STRL REUS W/ TWL LRG LVL3 (GOWN DISPOSABLE) ×2 IMPLANT
GOWN STRL REUS W/TWL LRG LVL3 (GOWN DISPOSABLE) ×4
LENS IOL TECNIS ITEC 23.5 (Intraocular Lens) ×1 IMPLANT
PACK CATARACT (MISCELLANEOUS) ×2 IMPLANT
PACK CATARACT BRASINGTON LX (MISCELLANEOUS) ×2 IMPLANT
PACK EYE AFTER SURG (MISCELLANEOUS) ×2 IMPLANT
SOL BSS BAG (MISCELLANEOUS) ×2
SOL PREP PVP 2OZ (MISCELLANEOUS) ×2
SOLUTION BSS BAG (MISCELLANEOUS) ×1 IMPLANT
SOLUTION PREP PVP 2OZ (MISCELLANEOUS) ×1 IMPLANT
SYR 3ML LL SCALE MARK (SYRINGE) ×2 IMPLANT
SYR 5ML LL (SYRINGE) ×2 IMPLANT
SYR TB 1ML 27GX1/2 LL (SYRINGE) ×2 IMPLANT
WATER STERILE IRR 250ML POUR (IV SOLUTION) ×2 IMPLANT
WIPE NON LINTING 3.25X3.25 (MISCELLANEOUS) ×2 IMPLANT

## 2016-07-28 NOTE — Discharge Instructions (Signed)
Eye Surgery Discharge Instructions  Expect mild scratchy sensation or mild soreness. DO NOT RUB YOUR EYE!  The day of surgery:  Minimal physical activity, but bed rest is not required  No reading, computer work, or close hand work  No bending, lifting, or straining.  May watch TV  For 24 hours:  No driving, legal decisions, or alcoholic beverages  Safety precautions  Eat anything you prefer: It is better to start with liquids, then soup then solid foods.  _____ Eye patch should be worn until postoperative exam tomorrow.  ____ Solar shield eyeglasses should be worn for comfort in the sunlight/patch while sleeping  Resume all regular medications including aspirin or Coumadin if these were discontinued prior to surgery. You may shower, bathe, shave, or wash your hair. Tylenol may be taken for mild discomfort.  Call your doctor if you experience significant pain, nausea, or vomiting, fever > 101 or other signs of infection. [phone] or [phone] Specific instructions:  Follow-up Information    PORFILIO,WILLIAM LOUIS, MD Follow up on 07/29/2016.   Specialty:  Ophthalmology Why:  10:35 am Contact information: [address] [place] [address] [phone]

## 2016-07-28 NOTE — Op Note (Signed)
PREOPERATIVE DIAGNOSIS:  Nuclear sclerotic cataract of the right eye.   POSTOPERATIVE DIAGNOSIS: nuclear sclerotic cataract right eye   OPERATIVE PROCEDURE:  Procedure(s): CATARACT EXTRACTION PHACO AND INTRAOCULAR LENS PLACEMENT (IOC)   SURGEON:  Birder Robson, MD.   ANESTHESIA:  Anesthesiologist: Alvin Critchley, MD CRNA: Silvana Newness, CRNA  1.      Managed anesthesia care. 2.      Topical tetracaine drops followed by 2% Xylocaine jelly applied in the preoperative holding area.   COMPLICATIONS:  None.   TECHNIQUE:   Stop and chop   DESCRIPTION OF PROCEDURE:  The patient was examined and consented in the preoperative holding area where the aforementioned topical anesthesia was applied to the right eye and then brought back to the Operating Room where the right eye was prepped and draped in the usual sterile ophthalmic fashion and a lid speculum was placed. A paracentesis was created with the side port blade and the anterior chamber was filled with viscoelastic. A near clear corneal incision was performed with the steel keratome. A continuous curvilinear capsulorrhexis was performed with a cystotome followed by the capsulorrhexis forceps. Hydrodissection and hydrodelineation were carried out with BSS on a blunt cannula. The lens was removed in a stop and chop  technique and the remaining cortical material was removed with the irrigation-aspiration handpiece. The capsular bag was inflated with viscoelastic and the Technis [id number]  lens was placed in the capsular bag without complication. The remaining viscoelastic was removed from the eye with the irrigation-aspiration handpiece. The wounds were hydrated. The anterior chamber was flushed with Miostat and the eye was inflated to physiologic pressure. 0.1 mL of cefuroxime concentration 10 mg/mL was placed in the anterior chamber. The wounds were found to be water tight. The eye was dressed with Vigamox. The patient was given protective glasses to wear  throughout the day and a shield with which to sleep tonight. The patient was also given drops with which to begin a drop regimen today and will follow-up with me in one day.  Implant Name Type Inv. Item Serial No. Manufacturer Lot No. LRB No. Used  LENS IOL DIOP 23.5 - [id number] Intraocular Lens LENS IOL DIOP 23.5 [id number]   Right 1   Procedure(s) with comments: CATARACT EXTRACTION PHACO AND INTRAOCULAR LENS PLACEMENT (IOC) (Right) - [place] 01:26 AP% 18.5 CDE 16.12 Fluid pack lot # [id number]  Electronically signed: [place] 07/28/2016 11:25 AM

## 2016-07-28 NOTE — Transfer of Care (Signed)
Immediate Anesthesia Transfer of Care Note  Patient: Tammy Manning  Procedure(s) Performed: Procedure(s) with comments: CATARACT EXTRACTION PHACO AND INTRAOCULAR LENS PLACEMENT (IOC) (Right) - [place] 01:26 AP% 18.5 CDE 16.12 Fluid pack lot # [id number]  Patient Location: PACU  Anesthesia Type:MAC  Level of Consciousness: awake, alert , oriented and patient cooperative  Airway & Oxygen Therapy: Patient Spontanous Breathing  Post-op Assessment: Report given to RN, Post -op Vital signs reviewed and stable and Patient moving all extremities X 4  Post vital signs: Reviewed and stable  Last Vitals:  Vitals:   07/28/16 0939  BP: (!) 192/65  Pulse: 66  Resp: 18  Temp: 36.7 C    Last Pain:  Vitals:   07/28/16 0939  TempSrc: Oral         Complications: No apparent anesthesia complications

## 2016-07-28 NOTE — Anesthesia Postprocedure Evaluation (Signed)
Anesthesia Post Note  Patient: Tammy Manning  Procedure(s) Performed: Procedure(s) (LRB): CATARACT EXTRACTION PHACO AND INTRAOCULAR LENS PLACEMENT (IOC) (Right)  Patient location during evaluation: PACU Anesthesia Type: MAC Level of consciousness: awake and alert, oriented and patient cooperative Pain management: satisfactory to patient Vital Signs Assessment: post-procedure vital signs reviewed and stable Respiratory status: spontaneous breathing and respiratory function stable Cardiovascular status: stable Anesthetic complications: no    Last Vitals:  Vitals:   07/28/16 0939  BP: (!) 192/65  Pulse: 66  Resp: 18  Temp: 36.7 C    Last Pain:  Vitals:   07/28/16 0939  TempSrc: Oral                 Silvana Newness A

## 2016-07-28 NOTE — H&P (Signed)
  All labs reviewed. Abnormal studies sent to patients PCP when indicated.  Previous H&P reviewed, patient examined, there are NO CHANGES.  Tammy Manning LOUIS9/5/201710:55 AM

## 2016-07-28 NOTE — Anesthesia Preprocedure Evaluation (Signed)
Anesthesia Evaluation  Patient identified by MRN, date of birth, ID band Patient awake    Reviewed: Allergy & Precautions, NPO status , Patient's Chart, lab work & pertinent test results, reviewed documented beta blocker date and time   History of Anesthesia Complications (+) PONV and history of anesthetic complications  Airway Mallampati: II       Dental  (+) Caps   Pulmonary former smoker,    Pulmonary exam normal        Cardiovascular hypertension, Pt. on medications and Pt. on home beta blockers + angina with exertion Normal cardiovascular exam+ Valvular Problems/Murmurs      Neuro/Psych  Headaches, PSYCHIATRIC DISORDERS    GI/Hepatic GERD  Medicated,  Endo/Other  negative endocrine ROS  Renal/GU negative Renal ROS     Musculoskeletal  (+) Arthritis ,   Abdominal Normal abdominal exam  (+)   Peds negative pediatric ROS (+)  Hematology negative hematology ROS (+)   Anesthesia Other Findings   Reproductive/Obstetrics                            Anesthesia Physical Anesthesia Plan  ASA: III  Anesthesia Plan: MAC   Post-op Pain Management:    Induction: Intravenous  Airway Management Planned: Nasal Cannula  Additional Equipment:   Intra-op Plan:   Post-operative Plan:   Informed Consent:   Dental advisory given  Plan Discussed with: CRNA and Surgeon  Anesthesia Plan Comments:         Anesthesia Quick Evaluation

## 2016-07-29 ENCOUNTER — Encounter: Payer: Self-pay | Admitting: Ophthalmology

## 2016-08-11 DIAGNOSIS — Z23 Encounter for immunization: Secondary | ICD-10-CM | POA: Diagnosis not present

## 2016-08-13 ENCOUNTER — Ambulatory Visit (INDEPENDENT_AMBULATORY_CARE_PROVIDER_SITE_OTHER): Payer: Medicare Other | Admitting: Podiatry

## 2016-08-13 ENCOUNTER — Encounter: Payer: Self-pay | Admitting: Podiatry

## 2016-08-13 DIAGNOSIS — M722 Plantar fascial fibromatosis: Secondary | ICD-10-CM | POA: Diagnosis not present

## 2016-08-13 DIAGNOSIS — M779 Enthesopathy, unspecified: Secondary | ICD-10-CM

## 2016-08-19 DIAGNOSIS — K641 Second degree hemorrhoids: Secondary | ICD-10-CM | POA: Diagnosis not present

## 2016-08-20 ENCOUNTER — Ambulatory Visit (INDEPENDENT_AMBULATORY_CARE_PROVIDER_SITE_OTHER): Payer: Medicare Other

## 2016-08-20 DIAGNOSIS — E538 Deficiency of other specified B group vitamins: Secondary | ICD-10-CM

## 2016-08-20 MED ORDER — CYANOCOBALAMIN 1000 MCG/ML IJ SOLN
1000.0000 ug | Freq: Once | INTRAMUSCULAR | Status: AC
  Administered 2016-08-20: 1000 ug via INTRAMUSCULAR

## 2016-08-20 NOTE — Progress Notes (Signed)
Subjective: 81 year old female presents the office today for follow up evaluation of plantar fasciitis as well as capsulitis the left foot. She states the pain to the left heel is much improved however she is still in some mild discomfort along the outside aspect just below the ankle on the left side. She has been stretching icing as well as wear supportive shoe gear. She is tried changing her shoes. No other complaints at this time. No acute changes since last appointment.  Objective: AAO x3, NAD DP/PT pulses palpable bilaterally, CRT less than 3 seconds At this time there is no tenderness palpation on the plantar medial tubercle of the calcaneus at the insertion of plantar fasciitis is no pain along the course the plantar fascia. There is no pain lateral compression of the calcaneus. No pain along the Achilles tendon. There is mild discomfort along the lateral aspect of the sinus tarsi on the left foot here there is no amount edema, erythema, increase in warmth. There is no pain with subtalar ankle joint range of motion. No other areas of tenderness bilaterally.  No open lesions or pre-ulcerative lesions.  No pain with calf compression, swelling, warmth, erythema  Assessment: Resolving left heel pain, plantar fasciitis with subtalar joint capsulitis  Plan: -All treatment options discussed with the patient including all alternatives, risks, complications.  -Remick continue stretching, icing exercises daily as well as supportive shoe gear to discuss orthotics against today. Also discussed a steroid injection into the sinus tarsi. Although symptoms are not resolved in 4 weeks or sooner if needed. Call any questions or concerns in the meantime.  Celesta Gentile, DPM

## 2016-08-31 ENCOUNTER — Encounter: Payer: Self-pay | Admitting: Family Medicine

## 2016-08-31 ENCOUNTER — Ambulatory Visit (INDEPENDENT_AMBULATORY_CARE_PROVIDER_SITE_OTHER): Payer: Medicare Other | Admitting: Family Medicine

## 2016-08-31 VITALS — BP 122/70 | HR 68 | Temp 98.2°F | Ht 64.0 in | Wt 207.5 lb

## 2016-08-31 DIAGNOSIS — F430 Acute stress reaction: Secondary | ICD-10-CM | POA: Diagnosis not present

## 2016-08-31 DIAGNOSIS — I10 Essential (primary) hypertension: Secondary | ICD-10-CM

## 2016-08-31 NOTE — Progress Notes (Signed)
Pre visit review using our clinic review tool, if applicable. No additional management support is needed unless otherwise documented below in the visit note. 

## 2016-08-31 NOTE — Progress Notes (Signed)
Subjective:    Patient ID: Tammy Manning, female    DOB: [birth year], 81 y.o.   MRN: [id number]  HPI Here for visit because she "does not feel good" Feels depleted so often  Shaky inside / like from being overly tired   Has a bad neck- she does get headaches from it occ- mild and tylenol relieves Legs get tired occ (should be wearing stockings) - she may try knee highs   Still has some sob walking up hill  Cardiologist aware    Wt Readings from Last 3 Encounters:  08/31/16 207 lb 8 oz (94.1 kg)  07/28/16 205 lb (93 kg)  07/21/16 210 lb 4 oz (95.4 kg)    BP Readings from Last 3 Encounters:  08/31/16 122/70  07/28/16 (!) 156/51  07/21/16 (!) 154/72   Beta blocker added at last cardiology appt due to palpitations and elevated bp-took it for 4 days and did not feel good with it  She did not tell Dr Fletcher Anon she stopped it   Stressors  Had recent cataract surgery - first one stressful / 2nd one went well-was nervous about it  Ankle and foot have been bothering her and seeing podiatrist (improved but not gone yet)  Politics-stress her out- she shuts it down  Did have to buy a new car - old one was 82 years  Husband is doing great-now worried about him (last year was a tough year)  Coming off of a tough year  Had an art show in Aug-has not been painting as much   Exercise -is doing as much as she did after cutting back this summer - walking  No other exercise besides walking  She does meditate -not often (when things get bad)  No yoga   Takes chlorazepate 7.5  Takes it once a week or less -only when something happens to really stress her out   Not depressed as a rule -but gets down when she does not feel good  Her symptoms do not keep her from doing anything   No drugs or alcohol   Labs look good in April    Does not want medication   Last counseling was years ago when she was having some problems with hypoglycemia Would be open to it   Patient Active Problem List    Diagnosis Date Noted  . Angina, class II ([place]) 09/10/2015  . Aortic insufficiency 09/10/2015  . Stress reaction 09/08/2015  . Atypical chest pain 08/30/2015  . Encounter for Medicare annual wellness exam 02/28/2013  . Bucket-handle tear of lateral meniscus of left knee as current injury 12/30/2012  . Palpitations 11/26/2012  . Episodic atrial fibrillation ([place]) 11/17/2012  . Special screening for malignant neoplasms, colon 01/21/2012  . Hearing loss of both ears 01/14/2012  . Adverse drug reaction 03/12/2011  . HEADACHE, CHRONIC 09/24/2010  . B12 deficiency 03/17/2010  . DYSPEPSIA 03/17/2010  . POSTMENOPAUSAL STATUS 08/20/2008  . Essential hypertension 07/19/2007  . Hyperlipidemia 06/24/2007  . CONSTIPATION 06/24/2007  . IBS 06/24/2007  . DERMATITIS, ATOPIC 06/24/2007  . SKIN CANCER, HX OF 06/24/2007   Past Medical History:  Diagnosis Date  . Anginal pain ([facility])   . Arthritis    Knees  . B12 deficiency    Mild  . Bucket-handle tear of lateral meniscus of left knee as current injury 12/30/2012  . Cancer (H[facility])    Skin, basal cell on nose and eyelid  . DJD (degenerative joint disease)    Low back  .  Dyspepsia   . GERD (gastroesophageal reflux disease)   . Heart murmur   . Heart palpitations   . HOH (hard of hearing)   . Hyperlipidemia   . Hypertension   . Lactose intolerance   . Mixed incontinence   . PONV (postoperative nausea and vomiting)   . Vulvar irritation    from urine pad, uses Triamcinolone oint.   Past Surgical History:  Procedure Laterality Date  . ABDOMINAL HYSTERECTOMY  1982   Large fibroids and bleeding  . APPENDECTOMY    . BILATERAL SALPINGOOPHORECTOMY  1986  . BONE GRAFT HIP ILIAC CREST     To Left arm  . BREAST REDUCTION SURGERY    . BREAST SURGERY    . CATARACT EXTRACTION W/PHACO Left 07/07/2016   Procedure: CATARACT EXTRACTION PHACO AND INTRAOCULAR LENS PLACEMENT (IOC);  Surgeon: Birder Robson, MD;  Location: [facility] ORS;  Service:  Ophthalmology;  Laterality: Left;  [place] 01:10 AP% 18.3 CDE 12.91 Fluid pak lot # [id number]  . CATARACT EXTRACTION W/PHACO Right 07/28/2016   Procedure: CATARACT EXTRACTION PHACO AND INTRAOCULAR LENS PLACEMENT ([place]);  Surgeon: Birder Robson, MD;  Location: [facility];  Service: Ophthalmology;  Laterality: Right;  [place] 01:26 AP% 18.5 CDE 16.12 Fluid pack lot # [id number]  . DILATION AND CURETTAGE OF UTERUS     miscarragesx2  . ESOPHAGOGASTRODUODENOSCOPY     Polyps  . [place]  2006  . KNEE ARTHROSCOPY WITH LATERAL MENISECTOMY Left 12/30/2012   Procedure: KNEE ARTHROSCOPY WITH LATERAL MENISECTOMY;  Surgeon: Johnny Bridge, MD;  Location: [place];  Service: Orthopedics;  Laterality: Left;  LEFT KNEE SCOPE LATERAL MENISCECTOMY  . MOUTH SURGERY    . skin cancer eyelid  2012  . TONSILLECTOMY     Social History  Substance Use Topics  . Smoking status: Former Smoker    Packs/day: 1.00    Years: 12.00    Types: Cigarettes    Quit date: 11/24/1963  . Smokeless tobacco: Never Used  . Alcohol use No     Comment: Rare   Family History  Problem Relation Age of Onset  . Cancer Mother     Colon  . Heart disease Father     CAD  . Diabetes Sister   . Diabetes Brother   . Leukemia Brother   . Cancer Paternal Aunt     Breast  . Diabetes Sister    Allergies  Allergen Reactions  . Amlodipine Besylate Other (See Comments)    Reaction:  Increases BP  . Antihistamines, Chlorpheniramine-Type Other (See Comments)    Reaction:  Makes pt hyper   . Atorvastatin Other (See Comments)    Reaction:  Myalgias   . Cefuroxime Axetil Other (See Comments)    Upset stomach  . Ciprofloxacin Other (See Comments)    Upset stomach  . Co Q 10 [Coenzyme Q10] Other (See Comments)    Reaction:  GI upset   . Crestor [Rosuvastatin Calcium] Other (See Comments)    Reaction:  Myalgias   . Dextromethorphan-Guaifenesin Other (See Comments)    Reaction:  Made pt jittery   . Epinephrine Hives    . Ramipril Other (See Comments)    Upset stomach  . Sulfamethoxazole-Trimethoprim Other (See Comments)    Upset stomach  . Vitamin D Analogs Other (See Comments)    Reaction:  GI upset    Current Outpatient Prescriptions on File Prior to Visit  Medication Sig Dispense Refill  . acetaminophen (TYLENOL) 500 MG tablet Take 500 mg  by mouth every 6 (six) hours as needed.    [name] Kitchen aspirin EC 81 MG tablet Take 81 mg by mouth daily.    [name] Kitchen atorvastatin (LIPITOR) 10 MG tablet TAKE 1 TABLET(10 MG) BY MOUTH DAILY AT 6 PM 90 tablet 1  . Calcium Carbonate-Vitamin D (CALCIUM 600+D PO) Take 1 tablet by mouth. TAKES 1200 T D 500    . clorazepate (TRANXENE) 7.5 MG tablet TAKE 1 TABLET BY MOUTH EVERY DAY AS NEEDED FOR ANXIETY 30 tablet 0  . irbesartan (AVAPRO) 150 MG tablet TAKE 1 TABLET BY MOUTH EVERY DAY (Patient taking differently: TAKE 1 TABLET BY MOUTH BID) 90 tablet 1  . omeprazole (PRILOSEC) 20 MG capsule Take 1 capsule (20 mg total) by mouth daily as needed (heartburn). 30 capsule 11   No current facility-administered medications on file prior to visit.     Review of Systems Review of Systems  Constitutional: Negative for fever, appetite change,  and unexpected weight change. pos for fatigue and poor sleep Eyes: Negative for pain and visual disturbance.  Respiratory: Negative for cough and shortness of breath.   Cardiovascular: Negative for cp or palpitations    Gastrointestinal: Negative for nausea, diarrhea and constipation.  Genitourinary: Negative for urgency and frequency.  Skin: Negative for pallor or rash   Neurological: Negative for weakness, light-headedness, numbness and headaches.  Hematological: Negative for adenopathy. Does not bruise/bleed easily.  Psychiatric/Behavioral: Negative for dysphoric mood. The patient is  nervous/anxious.         Objective:   Physical Exam  Constitutional: She appears well-developed and well-nourished. No distress.  HENT:  Head: Normocephalic and  atraumatic.  Mouth/Throat: Oropharynx is clear and moist.  Eyes: Conjunctivae and EOM are normal. Pupils are equal, round, and reactive to light.  Neck: Normal range of motion. Neck supple. No JVD present. Carotid bruit is not present. No thyromegaly present.  Cardiovascular: Normal rate, regular rhythm and intact distal pulses.  Exam reveals no gallop.   Murmur heard. Pulmonary/Chest: Effort normal and breath sounds normal. No respiratory distress. She has no wheezes. She has no rales.  No crackles  Abdominal: Soft. Bowel sounds are normal. She exhibits no distension, no abdominal bruit and no mass. There is no tenderness.  Musculoskeletal: She exhibits no edema.  Lymphadenopathy:    She has no cervical adenopathy.  Neurological: She is alert. She has normal reflexes.  Skin: Skin is warm and dry. No rash noted.  Psychiatric: Her speech is normal and behavior is normal. Thought content normal. Her mood appears anxious. Her affect is not blunt, not labile and not inappropriate. Thought content is not paranoid. Cognition and memory are normal. She does not exhibit a depressed mood. She expresses no homicidal and no suicidal ideation.  Very slightly anxious Not tearful Pleasant and attentive Good insight          Assessment & Plan:   Problem List Items Addressed This Visit      Other   Stress reaction - Primary    With anxious symptoms  Reviewed stressors/ coping techniques/symptoms/ support sources/ tx options and side effects in detail today  Had a rough year-thinks it is catching up with her Ref to counseling  Disc yoga/meditation and exercise as well as good self care  Day time naps if needed since not sleeping well  Good nutrition  Offered px buspar-declined for now  Will continue to follow >25 minutes spent in face to face time with patient, >50% spent in counselling or coordination of  care       Relevant Orders   Ambulatory referral to Psychology    Other Visit  Diagnoses   None.

## 2016-08-31 NOTE — Assessment & Plan Note (Signed)
With anxious symptoms  Reviewed stressors/ coping techniques/symptoms/ support sources/ tx options and side effects in detail today  Had a rough year-thinks it is catching up with her Ref to counseling  Disc yoga/meditation and exercise as well as good self care  Day time naps if needed since not sleeping well  Good nutrition  Offered px buspar-declined for now  Will continue to follow >25 minutes spent in face to face time with patient, >50% spent in counselling or coordination of care

## 2016-08-31 NOTE — Patient Instructions (Addendum)
Try to meditate almost every day 20-30 minutes or more  Look into a yoga class  Or a yoga DVD and a matt  Stop at check out for referral to counselor   I think you may be having some post stress "let down"- resulting in anxiety   (and perhaps some age changes that take some getting used to)   Continue to take care of yourself   Perhaps napping during the day would help as well Keep exercising

## 2016-08-31 NOTE — Assessment & Plan Note (Signed)
Better bp today  Pt is not taking metoprolol

## 2016-09-02 DIAGNOSIS — K641 Second degree hemorrhoids: Secondary | ICD-10-CM | POA: Diagnosis not present

## 2016-09-03 ENCOUNTER — Emergency Department: Payer: Medicare Other

## 2016-09-03 ENCOUNTER — Emergency Department
Admission: EM | Admit: 2016-09-03 | Discharge: 2016-09-03 | Disposition: A | Discharge status: Home / Self Care | Payer: Medicare Other | Attending: Emergency Medicine | Admitting: Emergency Medicine

## 2016-09-03 ENCOUNTER — Encounter: Payer: Self-pay | Admitting: Emergency Medicine

## 2016-09-03 DIAGNOSIS — M25561 Pain in right knee: Secondary | ICD-10-CM

## 2016-09-03 DIAGNOSIS — Z7982 Long term (current) use of aspirin: Secondary | ICD-10-CM | POA: Diagnosis not present

## 2016-09-03 DIAGNOSIS — M7121 Synovial cyst of popliteal space [Baker], right knee: Secondary | ICD-10-CM | POA: Diagnosis not present

## 2016-09-03 DIAGNOSIS — I10 Essential (primary) hypertension: Secondary | ICD-10-CM | POA: Insufficient documentation

## 2016-09-03 DIAGNOSIS — Z85820 Personal history of malignant melanoma of skin: Secondary | ICD-10-CM | POA: Insufficient documentation

## 2016-09-03 DIAGNOSIS — Z87891 Personal history of nicotine dependence: Secondary | ICD-10-CM | POA: Diagnosis not present

## 2016-09-03 DIAGNOSIS — Z79899 Other long term (current) drug therapy: Secondary | ICD-10-CM | POA: Diagnosis not present

## 2016-09-03 NOTE — ED Notes (Signed)
Patient is complaining of pain from her right knee up into her right thigh.  Patient reports pain began several days ago after walking her dog.  Patient reports having been to a chiropractor to adjust knee but knee has not improved.  Patient reports using warm compresses which has decreased swelling to leg.  Patient ambulatory to treatment room with slight limp.  No obvious distress at this time.

## 2016-09-03 NOTE — ED Triage Notes (Signed)
C/O right leg pain -- right knee radiating up toward right hip.  States she had twisted knee on Monday and pain has worsened since that time.  Seen by Chiropractor on Monday.

## 2016-09-03 NOTE — ED Provider Notes (Signed)
[facility] Emergency Department Provider Note   ____________________________________________   First MD Initiated Contact with Patient 09/03/16 1835     (approximate)  I have reviewed the triage vital signs and the nursing notes.   HISTORY  Chief Complaint Leg Pain   HPI Tammy Manning is a 81 y.o. female with a history of osteoarthritis who is presenting to the emergency department with right medial knee pain. She says that she has had a previous arthroscopy to the left knee for meniscus tear. She said that she was walking her dog this past Monday when she twisted her knee. She is unsure of which direction she twisted it in or if she rolled her ankle. However, she says that she has had increased pain with mild swelling to the knee on the right. She says that she went to her chiropractor who "adjusted" the knee. However, she says that there is still pain and she is concerned that she may be developing a blood clot. She is able to ambulate but says it is painful. Says that the pain is worse with movement.   Past Medical History:  Diagnosis Date  . Anginal pain ([place])   . Arthritis    Knees  . B12 deficiency    Mild  . Bucket-handle tear of lateral meniscus of left knee as current injury 12/30/2012  . Cancer (HCC)    Skin, basal cell on nose and eyelid  . DJD (degenerative joint disease)    Low back  . Dyspepsia   . GERD (gastroesophageal reflux disease)   . Heart murmur   . Heart palpitations   . HOH (hard of hearing)   . Hyperlipidemia   . Hypertension   . Lactose intolerance   . Mixed incontinence   . PONV (postoperative nausea and vomiting)   . Vulvar irritation    from urine pad, uses Triamcinolone oint.    Patient Active Problem List   Diagnosis Date Noted  . Angina, class II ([place]) 09/10/2015  . Aortic insufficiency 09/10/2015  . Stress reaction 09/08/2015  . Atypical chest pain 08/30/2015  . Encounter for Medicare annual wellness  exam 02/28/2013  . Bucket-handle tear of lateral meniscus of left knee as current injury 12/30/2012  . Palpitations 11/26/2012  . Episodic atrial fibrillation ([place]) 11/17/2012  . Special screening for malignant neoplasms, colon 01/21/2012  . Hearing loss of both ears 01/14/2012  . Adverse drug reaction 03/12/2011  . HEADACHE, CHRONIC 09/24/2010  . B12 deficiency 03/17/2010  . DYSPEPSIA 03/17/2010  . POSTMENOPAUSAL STATUS 08/20/2008  . Essential hypertension 07/19/2007  . Hyperlipidemia 06/24/2007  . CONSTIPATION 06/24/2007  . IBS 06/24/2007  . DERMATITIS, ATOPIC 06/24/2007  . SKIN CANCER, HX OF 06/24/2007    Past Surgical History:  Procedure Laterality Date  . ABDOMINAL HYSTERECTOMY  1982   Large fibroids and bleeding  . APPENDECTOMY    . BILATERAL SALPINGOOPHORECTOMY  1986  . BONE GRAFT HIP ILIAC CREST     To Left arm  . BREAST REDUCTION SURGERY    . BREAST SURGERY    . CATARACT EXTRACTION W/PHACO Left 07/07/2016   Procedure: CATARACT EXTRACTION PHACO AND INTRAOCULAR LENS PLACEMENT (IOC);  Surgeon: Birder Robson, MD;  Location: [facility];  Service: Ophthalmology;  Laterality: Left;  [place] 01:10 AP% 18.3 CDE 12.91 Fluid pak lot # [id number]  . CATARACT EXTRACTION W/PHACO Right 07/28/2016   Procedure: CATARACT EXTRACTION PHACO AND INTRAOCULAR LENS PLACEMENT ([place]);  Surgeon: Birder Robson, MD;  Location: [facility] OR[facility];  Service:  Ophthalmology;  Laterality: Right;  [place] 01:26 AP% 18.5 CDE 16.12 Fluid pack lot # [id number]  . DILATION AND CURETTAGE OF UTERUS     miscarragesx2  . ESOPHAGOGASTRODUODENOSCOPY     Polyps  . [place]  2006  . KNEE ARTHROSCOPY WITH LATERAL MENISECTOMY Left 12/30/2012   Procedure: KNEE ARTHROSCOPY WITH LATERAL MENISECTOMY;  Surgeon: Johnny Bridge, MD;  Location: [place];  Service: Orthopedics;  Laterality: Left;  LEFT KNEE SCOPE LATERAL MENISCECTOMY  . MOUTH SURGERY    . skin cancer eyelid  2012  . TONSILLECTOMY      Prior to  Admission medications   Medication Sig Start Date End Date Taking? Authorizing Provider  acetaminophen (TYLENOL) 500 MG tablet Take 500 mg by mouth every 6 (six) hours as needed.    Historical Provider, MD  aspirin EC 81 MG tablet Take 81 mg by mouth daily.    Historical Provider, MD  atorvastatin (LIPITOR) 10 MG tablet TAKE 1 TABLET(10 MG) BY MOUTH DAILY AT 6 PM 03/16/16   Abner Greenspan, MD  Calcium Carbonate-Vitamin D (CALCIUM 600+D PO) Take 1 tablet by mouth. TAKES 1200 T D 500    Historical Provider, MD  clorazepate (TRANXENE) 7.5 MG tablet TAKE 1 TABLET BY MOUTH EVERY DAY AS NEEDED FOR ANXIETY 07/17/16   Abner Greenspan, MD  irbesartan (AVAPRO) 150 MG tablet TAKE 1 TABLET BY MOUTH EVERY DAY Patient taking differently: TAKE 1 TABLET BY MOUTH BID 04/27/16   Abner Greenspan, MD  omeprazole (PRILOSEC) 20 MG capsule Take 1 capsule (20 mg total) by mouth daily as needed (heartburn). 03/18/16   Abner Greenspan, MD    Allergies Amlodipine besylate; Antihistamines, chlorpheniramine-type; Atorvastatin; Cefuroxime axetil; Ciprofloxacin; Co q 10 [coenzyme q10]; Crestor [rosuvastatin calcium]; Dextromethorphan-guaifenesin; Epinephrine; Ramipril; Sulfamethoxazole-trimethoprim; and Vitamin d analogs  Family History  Problem Relation Age of Onset  . Cancer Mother     Colon  . Heart disease Father     CAD  . Diabetes Sister   . Diabetes Brother   . Leukemia Brother   . Cancer Paternal Aunt     Breast  . Diabetes Sister     Social History Social History  Substance Use Topics  . Smoking status: Former Smoker    Packs/day: 1.00    Years: 12.00    Types: Cigarettes    Quit date: 11/24/1963  . Smokeless tobacco: Never Used  . Alcohol use No     Comment: Rare    Review of Systems Constitutional: No fever/chills Eyes: No visual changes. ENT: No sore throat. Cardiovascular: Denies chest pain. Respiratory: Denies shortness of breath. Gastrointestinal: No abdominal pain.  No nausea, no vomiting.  No  diarrhea.  No constipation. Genitourinary: Negative for dysuria. Musculoskeletal: Negative for back pain. Skin: Negative for rash. Neurological: Negative for headaches, focal weakness or numbness.  10-point ROS otherwise negative.  ____________________________________________   PHYSICAL EXAM:  VITAL SIGNS: ED Triage Vitals  Enc Vitals Group     BP 09/03/16 1802 (!) 191/75     Pulse Rate 09/03/16 1802 86     Resp 09/03/16 1802 16     Temp 09/03/16 1802 98.3 F (36.8 C)     Temp Source 09/03/16 1802 Oral     SpO2 09/03/16 1802 98 %     Weight 09/03/16 1801 205 lb (93 kg)     Height 09/03/16 1801 5' 4"  (1.626 m)     Head Circumference --      Peak  Flow --      Pain Score 09/03/16 1801 3     Pain Loc --      Pain Edu? --      Excl. in [place]? --     Constitutional: Alert and oriented. Well appearing and in no acute distress. Eyes: Conjunctivae are normal. PERRL. EOMI. Head: Atraumatic. Nose: No congestion/rhinnorhea. Mouth/Throat: Mucous membranes are moist.   Neck: No stridor.   Cardiovascular: Normal rate, regular rhythm. Grossly normal heart sounds.  Respiratory: Normal respiratory effort.  No retractions. Lungs CTAB. Gastrointestinal: Soft and nontender. No distention.  Musculoskeletal: No swelling to bilateral lower extremity. Mild tenderness over the medial knee at the medial proximal tibia. No ecchymosis. No ligamentous laxity. Patient able to range with 5 out of 5 strength. Bilateral dorsalis pedis pulses are intact and equal.   Neurologic:  Normal speech and language. No gross focal neurologic deficits are appreciated. No gait instability. Skin:  Skin is warm, dry and intact. No rash noted. Psychiatric: Mood and affect are normal. Speech and behavior are normal.  ____________________________________________   LABS (all labs ordered are listed, but only abnormal results are displayed)  Labs Reviewed - No data to  display ____________________________________________  EKG   ____________________________________________  RADIOLOGY    [name] [phone] (81 y.o. F) ED31A    Lab Results   None  Imaging Results     DG Knee Complete 4 Views Right (Final result)  Result time 09/03/16 19:54:52  Final result by Genia Del, MD (09/03/16 19:54:52)           Narrative:   CLINICAL DATA: 81 year old female with left knee pain increasing over several days after injury walking dog. Initial encounter.  EXAM: RIGHT KNEE - COMPLETE 4+ VIEW  COMPARISON: None.  FINDINGS: No fracture or dislocation.  Question tiny joint effusion.  IMPRESSION: No fracture or dislocation.   Electronically Signed By: Genia Del M.D. On: 09/03/2016 19:54            US Venous Img Lower Unilateral Right (Final result)  Result time 09/03/16 19:57:48  Final result by Genia Del, MD (09/03/16 19:57:48)           Narrative:   CLINICAL DATA: 81 year old female with right knee pain and fullness for 4 days. Initial encounter.  EXAM: Right LOWER EXTREMITY VENOUS DOPPLER ULTRASOUND  TECHNIQUE: Gray-scale sonography with graded compression, as well as color Doppler and duplex ultrasound were performed to evaluate the lower extremity deep venous systems from the level of the common femoral vein and including the common femoral, femoral, profunda femoral, popliteal and calf veins including the posterior tibial, peroneal and gastrocnemius veins when visible. The superficial great saphenous vein was also interrogated. Spectral Doppler was utilized to evaluate flow at rest and with distal augmentation maneuvers in the common femoral, femoral and popliteal veins.  COMPARISON: Right knee films same date dictated separately.  FINDINGS: Contralateral Common Femoral Vein: Respiratory phasicity is normal and symmetric with the symptomatic side. No evidence of thrombus. Normal  compressibility.  Common Femoral Vein: No evidence of thrombus. Normal compressibility, respiratory phasicity and response to augmentation.  Saphenofemoral Junction: No evidence of thrombus. Normal compressibility and flow on color Doppler imaging.  Profunda Femoral Vein: No evidence of thrombus. Normal compressibility and flow on color Doppler imaging.  Femoral Vein: No evidence of thrombus. Normal compressibility, respiratory phasicity and response to augmentation.  Popliteal Vein: No evidence of thrombus. Normal compressibility, respiratory phasicity and response to augmentation.  Calf Veins: No evidence of  thrombus. Normal compressibility and flow on color Doppler imaging.  Superficial Great Saphenous Vein: No evidence of thrombus. Normal compressibility and flow on color Doppler imaging.  Venous Reflux: None.  Other Findings: 3.3 x 0.9 x 2.1 cm cystic structure right popliteal fossa suggestive of Baker's cyst.  IMPRESSION: No evidence of deep venous thrombosis of the right lower extremity.  3.3 x 0.9 x 2.1 cm cystic structure right popliteal fossa suggestive of Baker's cyst.   Electronically Signed By: Genia Del M.D. On: 09/03/2016 19:57            ____________________________________________   PROCEDURES  Procedure(s) performed:   Procedures  Critical Care performed:   ____________________________________________   INITIAL IMPRESSION / ASSESSMENT AND PLAN / ED COURSE  Pertinent labs & imaging results that were available during my care of the patient were reviewed by me and considered in my medical decision making (see chart for details).  ----------------------------------------- 8:22 PM on 09/03/2016 -----------------------------------------  Patient with Baker cyst but otherwise no other pathology. Has an Ace wrap as well as icy hot at home. Will use Tylenol as well for pain control. We'll be following up with orthopedic doctor. She  will also be giving the extremity elevated. Possible soft tissue injury. Does not look like a fracture or DVT. Reviewed the findings with the patient as well as the plan and she is understanding and willing to comply.  Clinical Course     ____________________________________________   FINAL CLINICAL IMPRESSION(S) / ED DIAGNOSES  Knee pain. Right Baker cyst.    NEW MEDICATIONS STARTED DURING THIS VISIT:  New Prescriptions   No medications on file     Note:  This document was prepared using Dragon voice recognition software and may include unintentional dictation errors.    Orbie Pyo, MD 09/03/16 [phone]

## 2016-09-03 NOTE — ED Notes (Signed)
Attempted to go discharge patient, patient not in room and no family present wither.  Paperwork and discharge not completed with patient, but patient had been discharged per Dr. Clearnce Hasten.

## 2016-09-07 DIAGNOSIS — M1711 Unilateral primary osteoarthritis, right knee: Secondary | ICD-10-CM | POA: Diagnosis not present

## 2016-09-07 DIAGNOSIS — M76822 Posterior tibial tendinitis, left leg: Secondary | ICD-10-CM | POA: Diagnosis not present

## 2016-09-07 DIAGNOSIS — M2142 Flat foot [pes planus] (acquired), left foot: Secondary | ICD-10-CM | POA: Diagnosis not present

## 2016-09-15 ENCOUNTER — Encounter: Payer: Self-pay | Admitting: Sports Medicine

## 2016-09-15 ENCOUNTER — Ambulatory Visit (INDEPENDENT_AMBULATORY_CARE_PROVIDER_SITE_OTHER): Payer: Medicare Other | Admitting: Sports Medicine

## 2016-09-15 VITALS — BP 162/60 | Ht 64.5 in | Wt 202.0 lb

## 2016-09-15 DIAGNOSIS — M2142 Flat foot [pes planus] (acquired), left foot: Secondary | ICD-10-CM | POA: Diagnosis not present

## 2016-09-15 DIAGNOSIS — M2141 Flat foot [pes planus] (acquired), right foot: Secondary | ICD-10-CM

## 2016-09-15 NOTE — Progress Notes (Signed)
   Subjective:    Patient ID: Tammy Manning, female    DOB: 27-Feb-1935, 81 y.o.   MRN: [id number]  HPI chief complaint: Left foot pain  Very pleasant 81 year old female comes in today at the request of Dr. Noemi Chapel for possible orthotics. She was seen by Dr. Noemi Chapel on October 16 and diagnosed with posterior tibial tendinitis. He placed her into a posterior tibial tendon brace but she has now transitioned into a lower profile compression sleeve. Her pain has improved since she saw Dr. Noemi Chapel. Unfortunately, she did not bring any shoes with her today that would accommodate an orthotic. She has not had orthotics previously. She is planning on taking a cruise in a few days and will be gone for a couple of weeks. She denies any prior foot surgery. No numbness or tingling.  Past medical history reviewed Medications reviewed Allergies reviewed    Review of Systems As above    Objective:   Physical Exam  Well-developed, well-nourished. No acute distress. Awake alert and oriented 3. Vital signs reviewed  Left foot: Pes planus with standing. No tenderness to palpation. No swelling. Posterior tibialis tendon is intact and functioning appropriately. Neurovascularly intact distally. Walking without a limp.      Assessment & Plan:   Left foot posterior tibialis tendon tendinitis Pes planus  Patient will return at a later date with a pair of walking shoes that will accommodate an orthotic. I will probably start with a temporary orthotic consisting of a green sports insole and a scaphoid pad for arch support. We could consider a custom orthotic after that if she finds the temporary orthotics to be comfortable. In the meantime, I've encouraged her to continue with her ankle compression sleeve which does seem to provide pretty good support to her arch. She should also try to wear shoes with a supportive arch while on her cruise. She would like to return to the office sometime after her cruise for her  inserts. We will be happy to see her at her convenience.

## 2016-09-16 ENCOUNTER — Other Ambulatory Visit: Payer: Self-pay | Admitting: Family Medicine

## 2016-09-28 ENCOUNTER — Other Ambulatory Visit: Payer: Self-pay | Admitting: Family Medicine

## 2016-09-30 ENCOUNTER — Other Ambulatory Visit: Payer: Self-pay | Admitting: Orthopedic Surgery

## 2016-09-30 DIAGNOSIS — M25561 Pain in right knee: Secondary | ICD-10-CM

## 2016-09-30 DIAGNOSIS — M1711 Unilateral primary osteoarthritis, right knee: Secondary | ICD-10-CM | POA: Diagnosis not present

## 2016-10-01 ENCOUNTER — Ambulatory Visit (INDEPENDENT_AMBULATORY_CARE_PROVIDER_SITE_OTHER): Payer: Medicare Other

## 2016-10-01 DIAGNOSIS — E538 Deficiency of other specified B group vitamins: Secondary | ICD-10-CM

## 2016-10-01 MED ORDER — CYANOCOBALAMIN 1000 MCG/ML IJ SOLN
1000.0000 ug | Freq: Once | INTRAMUSCULAR | Status: AC
  Administered 2016-10-01: 1000 ug via INTRAMUSCULAR

## 2016-10-09 ENCOUNTER — Ambulatory Visit: Payer: Medicare Other | Admitting: Cardiovascular Disease

## 2016-10-09 ENCOUNTER — Ambulatory Visit
Admission: RE | Admit: 2016-10-09 | Discharge: 2016-10-09 | Disposition: A | Discharge status: Home / Self Care | Payer: Medicare Other | Source: Ambulatory Visit | Attending: Orthopedic Surgery | Admitting: Orthopedic Surgery

## 2016-10-09 DIAGNOSIS — M1711 Unilateral primary osteoarthritis, right knee: Secondary | ICD-10-CM | POA: Diagnosis not present

## 2016-10-09 DIAGNOSIS — S83241A Other tear of medial meniscus, current injury, right knee, initial encounter: Secondary | ICD-10-CM | POA: Diagnosis not present

## 2016-10-09 DIAGNOSIS — M7121 Synovial cyst of popliteal space [Baker], right knee: Secondary | ICD-10-CM | POA: Insufficient documentation

## 2016-10-09 DIAGNOSIS — X58XXXA Exposure to other specified factors, initial encounter: Secondary | ICD-10-CM | POA: Insufficient documentation

## 2016-10-09 DIAGNOSIS — M25561 Pain in right knee: Secondary | ICD-10-CM | POA: Diagnosis not present

## 2016-10-12 DIAGNOSIS — S83241A Other tear of medial meniscus, current injury, right knee, initial encounter: Secondary | ICD-10-CM | POA: Diagnosis not present

## 2016-10-12 DIAGNOSIS — S83281A Other tear of lateral meniscus, current injury, right knee, initial encounter: Secondary | ICD-10-CM | POA: Diagnosis not present

## 2016-10-21 ENCOUNTER — Ambulatory Visit (INDEPENDENT_AMBULATORY_CARE_PROVIDER_SITE_OTHER): Payer: Medicare Other | Admitting: Sports Medicine

## 2016-10-21 ENCOUNTER — Encounter: Payer: Self-pay | Admitting: Sports Medicine

## 2016-10-21 DIAGNOSIS — K641 Second degree hemorrhoids: Secondary | ICD-10-CM | POA: Diagnosis not present

## 2016-10-21 DIAGNOSIS — M25561 Pain in right knee: Secondary | ICD-10-CM

## 2016-10-22 NOTE — Progress Notes (Signed)
Patient ID: Tammy Manning, female   DOB: 1935-07-03, 81 y.o.   MRN: [id number]  Patient comes in today for orthotics. She forgot to bring her shoes with her to her last office visit. She is still doing well in regards to her posterior tibialis tendon tendinitis but unfortunately she injured her right knee while on vacation. She is scheduled for a right knee arthroscopy with Dr. Mardelle Matte on December 7. We had previously discussed green sports insoles with scaphoid pads in lieu of custom orthotics for her foot pain. I showed the patient both types of orthotics and she has elected to go with the green sports insoles and scaphoid pads. She feels like the custom orthotic may be a little too thick. If she changes her mind in the future and would like a custom orthotic, then I would be happy to make her a pair. I think she would just like to concentrate on getting past her right knee arthroscopy for now.

## 2016-10-28 ENCOUNTER — Telehealth: Payer: Self-pay | Admitting: Cardiovascular Disease

## 2016-10-28 NOTE — Telephone Encounter (Signed)
Routed echo, EKG, and OV notes as requested by Lurena Joiner to [facility] [place], [phone]

## 2016-10-29 DIAGNOSIS — M23221 Derangement of posterior horn of medial meniscus due to old tear or injury, right knee: Secondary | ICD-10-CM | POA: Diagnosis not present

## 2016-10-29 DIAGNOSIS — M23261 Derangement of other lateral meniscus due to old tear or injury, right knee: Secondary | ICD-10-CM | POA: Diagnosis not present

## 2016-10-29 DIAGNOSIS — M23241 Derangement of anterior horn of lateral meniscus due to old tear or injury, right knee: Secondary | ICD-10-CM | POA: Diagnosis not present

## 2016-10-29 DIAGNOSIS — S83241A Other tear of medial meniscus, current injury, right knee, initial encounter: Secondary | ICD-10-CM | POA: Diagnosis not present

## 2016-10-29 DIAGNOSIS — S83281A Other tear of lateral meniscus, current injury, right knee, initial encounter: Secondary | ICD-10-CM | POA: Diagnosis not present

## 2016-11-11 DIAGNOSIS — S83241D Other tear of medial meniscus, current injury, right knee, subsequent encounter: Secondary | ICD-10-CM | POA: Diagnosis not present

## 2016-12-13 ENCOUNTER — Other Ambulatory Visit: Payer: Self-pay | Admitting: Family Medicine

## 2016-12-14 MED ORDER — CLORAZEPATE DIPOTASSIUM 7.5 MG PO TABS: 7.5000 mg | ORAL_TABLET | Freq: Every day | ORAL | 0 refills | Status: DC | PRN

## 2016-12-14 NOTE — Telephone Encounter (Signed)
CPE scheduled on 04/20/17, last filled on 07/17/16 #30 tabs with 0 refills, please advise

## 2016-12-14 NOTE — Telephone Encounter (Signed)
Px written for call in   

## 2016-12-14 NOTE — Telephone Encounter (Signed)
Rx called in as prescribed 

## 2016-12-16 ENCOUNTER — Ambulatory Visit (INDEPENDENT_AMBULATORY_CARE_PROVIDER_SITE_OTHER): Payer: Medicare Other | Admitting: *Deleted

## 2016-12-16 DIAGNOSIS — S83241D Other tear of medial meniscus, current injury, right knee, subsequent encounter: Secondary | ICD-10-CM | POA: Diagnosis not present

## 2016-12-16 DIAGNOSIS — E538 Deficiency of other specified B group vitamins: Secondary | ICD-10-CM | POA: Diagnosis not present

## 2016-12-16 MED ORDER — CYANOCOBALAMIN 1000 MCG/ML IJ SOLN
1000.0000 ug | INTRAMUSCULAR | Status: DC
  Administered 2016-12-16 – 2017-06-09 (×4): 1000 ug via INTRAMUSCULAR

## 2017-01-13 DIAGNOSIS — S83241D Other tear of medial meniscus, current injury, right knee, subsequent encounter: Secondary | ICD-10-CM | POA: Diagnosis not present

## 2017-02-08 IMAGING — US US EXTREM LOW VENOUS*R*
1 series · 13 of 24 positions shown · non-contrast
Comparison: Right knee films same date dictated separately.

CLINICAL DATA: 81-year-old female with right knee pain and fullness
for 4 days. Initial encounter.



[Series 1: us extrem low venous*right* · 0.09mm/px · 13 of 37 slices shown]
[im 1/37]
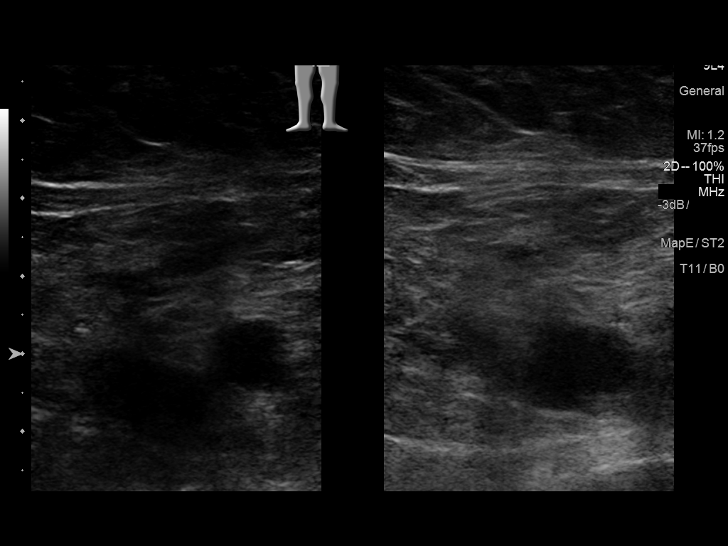
[im 4/37]
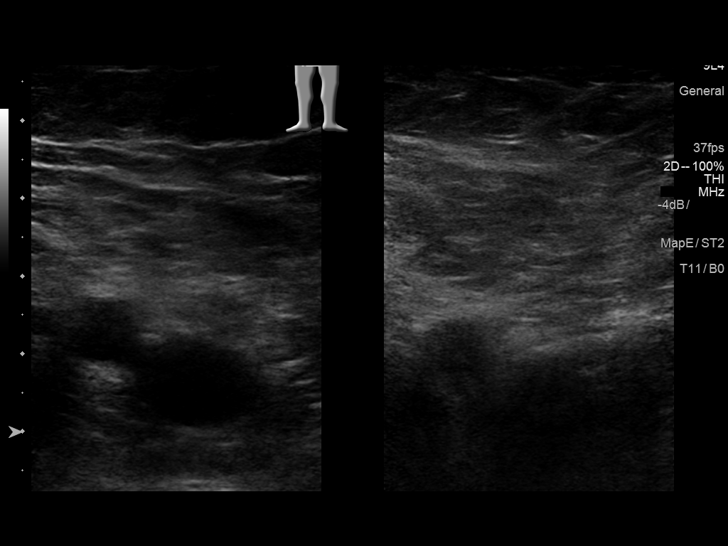
[im 7/37]
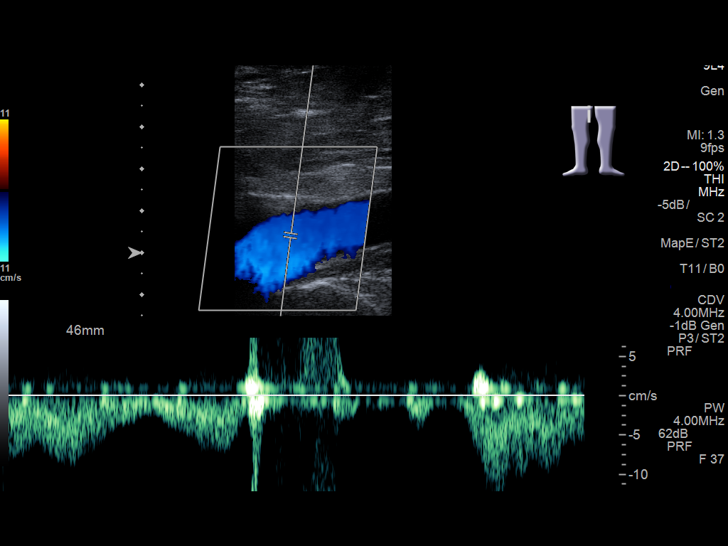
[im 10/37]
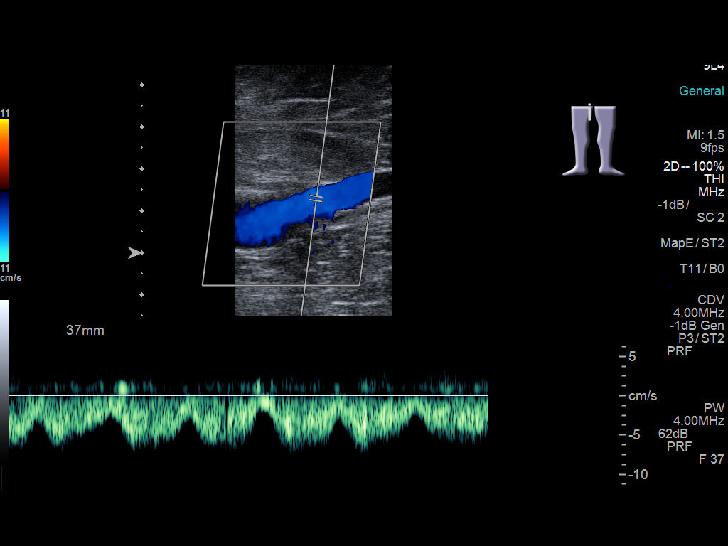
[im 13/37]
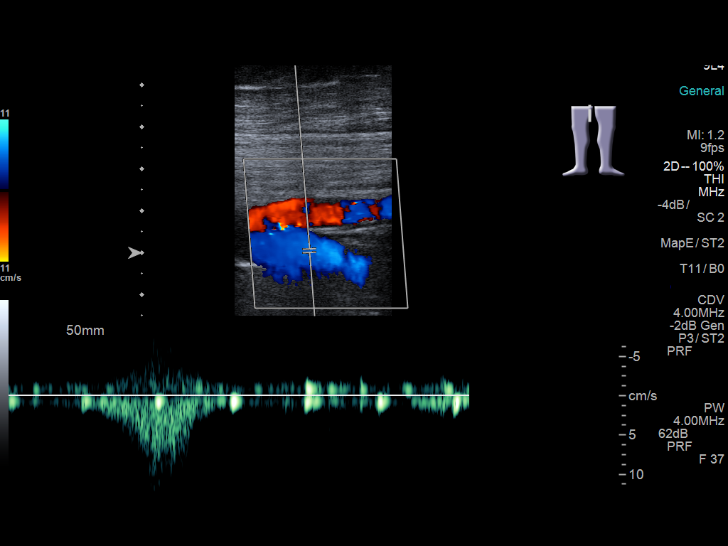
[im 16/37]
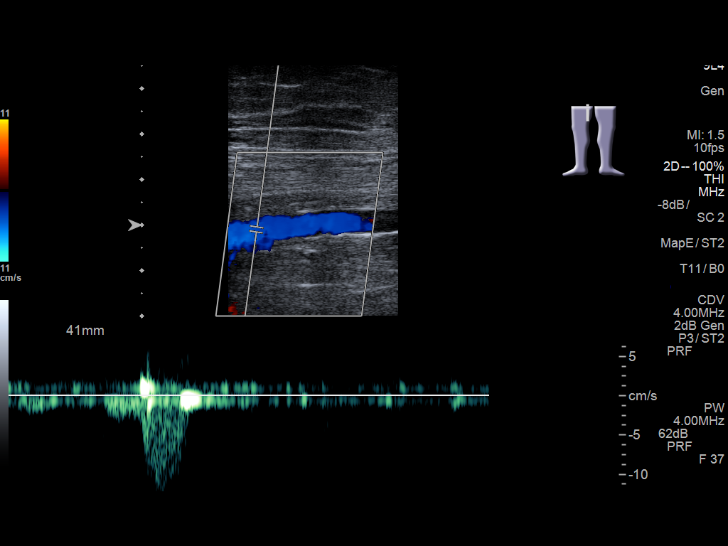
[im 19/37]
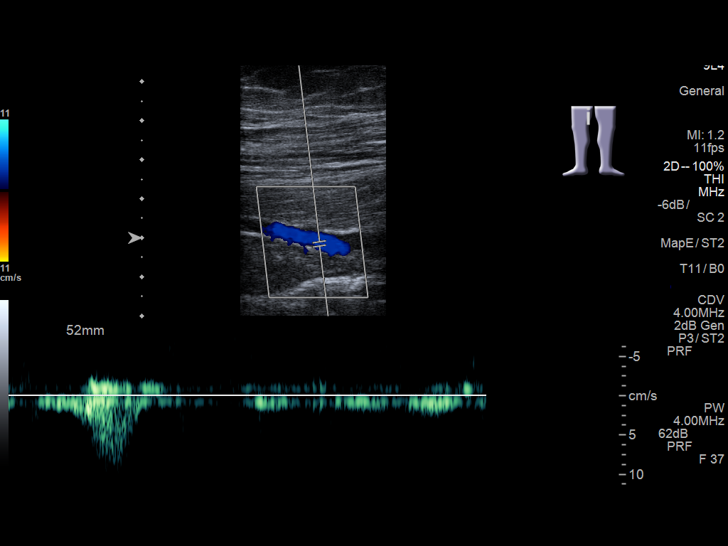
[im 21/37]
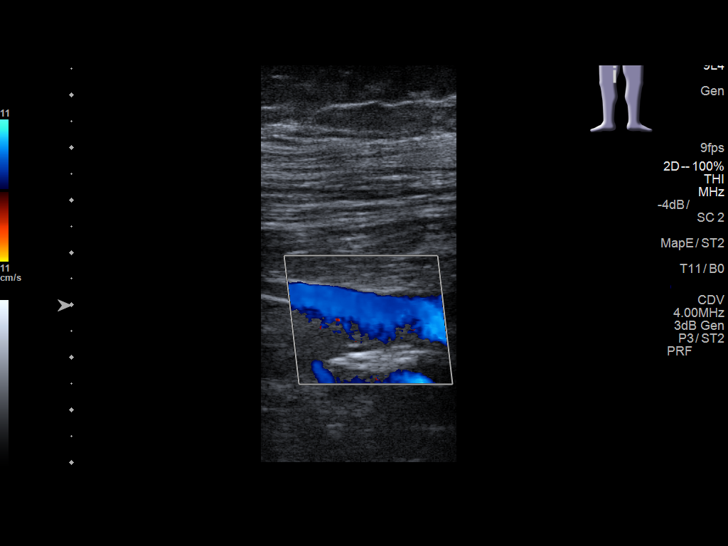
[im 24/37]
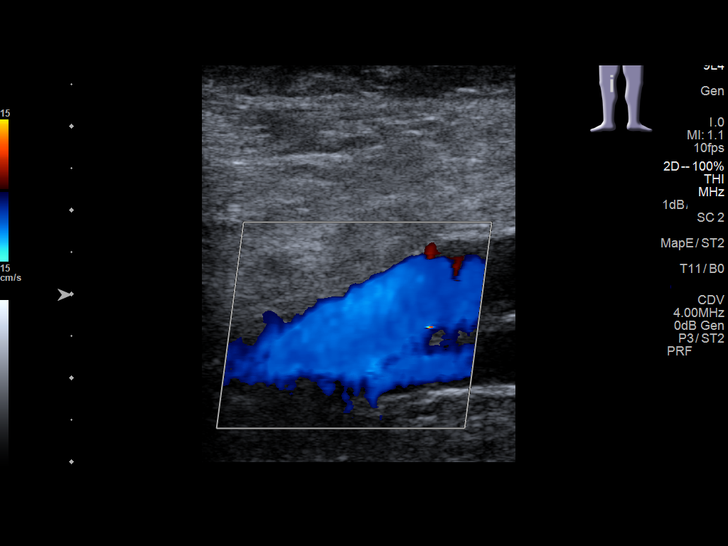
[im 27/37]
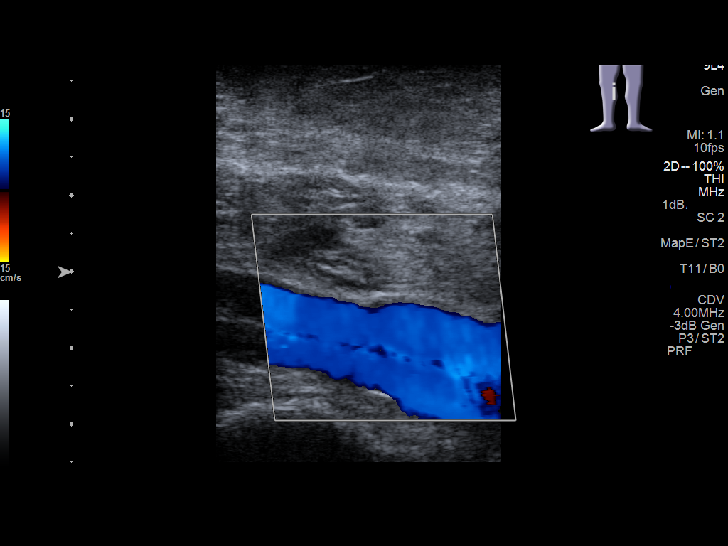
[im 30/37]
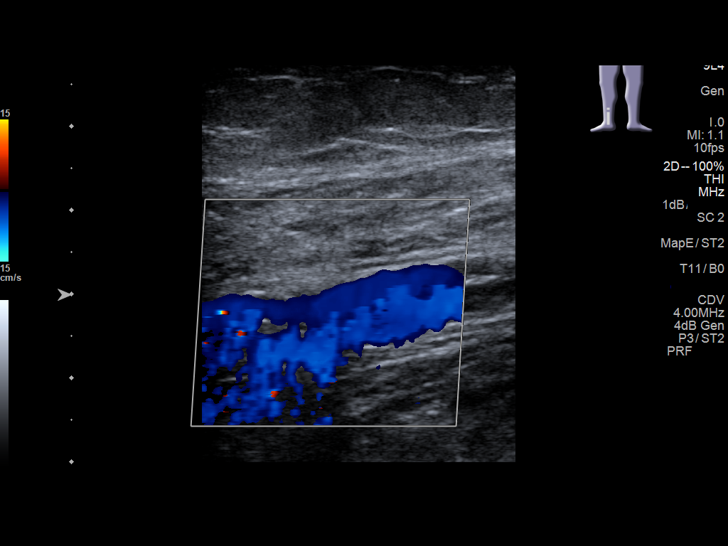
[im 33/37]
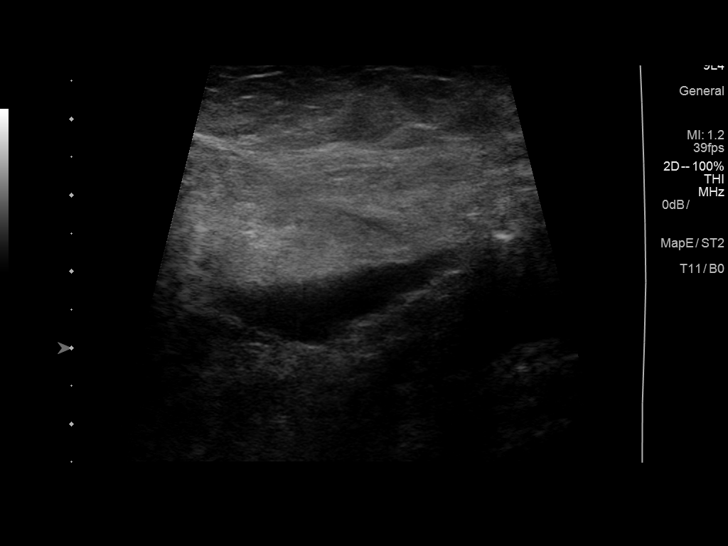
[im 37/37]
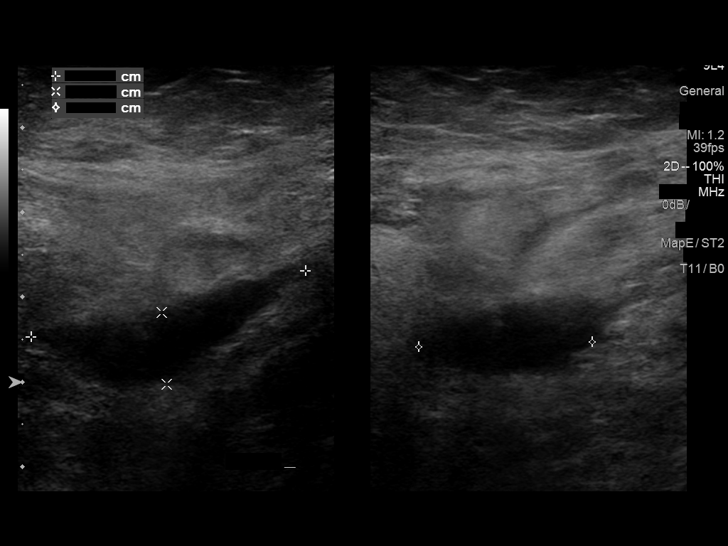

[13 of 24 positions shown; findings below may reference images not displayed]

FINDINGS: Contralateral Common Femoral Vein: Respiratory phasicity is normal
and symmetric with the symptomatic side. No evidence of thrombus.
Normal compressibility.

Common Femoral Vein: No evidence of thrombus. Normal
compressibility, respiratory phasicity and response to augmentation.

Saphenofemoral Junction: No evidence of thrombus. Normal
compressibility and flow on color Doppler imaging.

Profunda Femoral Vein: No evidence of thrombus. Normal
compressibility and flow on color Doppler imaging.

Femoral Vein: No evidence of thrombus. Normal compressibility,
respiratory phasicity and response to augmentation.

Popliteal Vein: No evidence of thrombus. Normal compressibility,
respiratory phasicity and response to augmentation.

Calf Veins: No evidence of thrombus. Normal compressibility and flow
on color Doppler imaging.

Superficial Great Saphenous Vein: No evidence of thrombus. Normal
compressibility and flow on color Doppler imaging.

Venous Reflux:  None.

Other Findings: 3.3 x 0.9 x 2.1 cm cystic structure right popliteal
fossa suggestive of Baker's cyst.
IMPRESSION: No evidence of deep venous thrombosis of the right lower extremity.

3.3 x 0.9 x 2.1 cm cystic structure right popliteal fossa suggestive
of Baker's cyst.

## 2017-02-25 ENCOUNTER — Ambulatory Visit (INDEPENDENT_AMBULATORY_CARE_PROVIDER_SITE_OTHER): Payer: Medicare Other | Admitting: *Deleted

## 2017-02-25 DIAGNOSIS — E538 Deficiency of other specified B group vitamins: Secondary | ICD-10-CM

## 2017-02-26 ENCOUNTER — Telehealth: Payer: Self-pay | Admitting: Cardiovascular Disease

## 2017-02-26 NOTE — Telephone Encounter (Signed)
Patient not feeling well wants sooner that may with arida.  Patient refused 4/23 app with pa she would rather see Fletcher Anon   She states she has heaviness on L side of chest into Neck and around into shoulder blade area .  She feels stressed about this.  Please call.

## 2017-02-26 NOTE — Telephone Encounter (Signed)
Pt reports having periods of "an annoying hurt" on left side of chest, shoulder and neck.  Can't recall when symptoms began but it has gotten worse over the past 1.5 weeks stating they are  hard to explain. Worrying brings on symptoms.  Dr. Glori Bickers recently prescribed clorazepate PRN for anxiety and she has noticed some improvement in sx when taking the medication. She is SOB "sometimes" when walking her dogs but states this is not new.  SBP 150s, unsure of HR. Feels to have been in afib off and on last week. She experienced leg edema after taking two doses of metoprolol and discontinued the medication. She would like a sooner appt w/Dr. Fletcher Anon. Offered April 23 w/Ryan Dunn, PA for which she is agreeable. Reviewed s/s that would require pt to be seen in the ED. She verbalized understanding, will continue to monitor and await further advise regarding elevated BP.  Routed to MD.

## 2017-03-01 DIAGNOSIS — H35363 Drusen (degenerative) of macula, bilateral: Secondary | ICD-10-CM | POA: Diagnosis not present

## 2017-03-01 NOTE — Telephone Encounter (Signed)
Monitor blood pressure and heart rate and bring the readings to her visit with Thurmond Butts.

## 2017-03-01 NOTE — Telephone Encounter (Signed)
Left detailed message w/CB number if questions on pt's home VM.

## 2017-03-08 ENCOUNTER — Ambulatory Visit: Payer: Medicare Other | Admitting: Family Medicine

## 2017-03-09 ENCOUNTER — Ambulatory Visit (INDEPENDENT_AMBULATORY_CARE_PROVIDER_SITE_OTHER): Payer: Medicare Other | Admitting: Primary Care

## 2017-03-09 ENCOUNTER — Encounter: Payer: Self-pay | Admitting: Primary Care

## 2017-03-09 VITALS — BP 122/76 | HR 67 | Temp 98.9°F | Ht 64.0 in | Wt 208.1 lb

## 2017-03-09 DIAGNOSIS — L539 Erythematous condition, unspecified: Secondary | ICD-10-CM

## 2017-03-09 DIAGNOSIS — W57XXXA Bitten or stung by nonvenomous insect and other nonvenomous arthropods, initial encounter: Secondary | ICD-10-CM

## 2017-03-09 NOTE — Progress Notes (Signed)
Subjective:    Patient ID: Tammy Manning, female    DOB: 25-Sep-1935, 83 y.o.   MRN: [id number]  HPI  Tammy Manning is a 82 year old female who presents today with a chief complaint of tick bite. Her bite is located to the medial right upper thigh that she first noticed Saturday after walking her dog outside. She removed the tick with her hands that day. She initially noticed moderate erythema around the site which has since improved. She denies fevers, chills, fatigue, nauase. She's been applying an old antibiotic gel that was prescribed for her eyes.   Review of Systems  Constitutional: Negative for fatigue and fever.  Respiratory: Negative for shortness of breath.   Cardiovascular: Negative for palpitations.  Musculoskeletal: Negative for myalgias.  Skin: Positive for color change and wound.  Neurological: Negative for headaches.       Past Medical History:  Diagnosis Date  . Anginal pain ([place])   . Arthritis    Knees  . B12 deficiency    Mild  . Bucket-handle tear of lateral meniscus of left knee as current injury 12/30/2012  . Cancer (H[facility])    Skin, basal cell on nose and eyelid  . DJD (degenerative joint disease)    Low back  . Dyspepsia   . GERD (gastroesophageal reflux disease)   . Heart murmur   . Heart palpitations   . HOH (hard of hearing)   . Hyperlipidemia   . Hypertension   . Lactose intolerance   . Mixed incontinence   . PONV (postoperative nausea and vomiting)   . Vulvar irritation    from urine pad, uses Triamcinolone oint.     Social History   Social History  . Marital status: Married    Spouse name: N/A  . Number of children: 2  . Years of education: N/A   Occupational History  . Proofreader Retired   Social History Main Topics  . Smoking status: Former Smoker    Packs/day: 1.00    Years: 12.00    Types: Cigarettes    Quit date: 11/24/1963  . Smokeless tobacco: Never Used  . Alcohol use No     Comment: Rare  . Drug use: No  . Sexual  activity: No   Other Topics Concern  . Not on file   Social History Narrative  . No narrative on file    Past Surgical History:  Procedure Laterality Date  . ABDOMINAL HYSTERECTOMY  1982   Large fibroids and bleeding  . APPENDECTOMY    . BILATERAL SALPINGOOPHORECTOMY  1986  . BONE GRAFT HIP ILIAC CREST     To Left arm  . BREAST REDUCTION SURGERY    . BREAST SURGERY    . CATARACT EXTRACTION W/PHACO Left 07/07/2016   Procedure: CATARACT EXTRACTION PHACO AND INTRAOCULAR LENS PLACEMENT (IOC);  Surgeon: Birder Robson, MD;  Location: [facility] ORS;  Service: Ophthalmology;  Laterality: Left;  [place] 01:10 AP% 18.3 CDE 12.91 Fluid pak lot # [id number]  . CATARACT EXTRACTION W/PHACO Right 07/28/2016   Procedure: CATARACT EXTRACTION PHACO AND INTRAOCULAR LENS PLACEMENT ([place]);  Surgeon: Birder Robson, MD;  Location: [facility] OR[facility];  Service: Ophthalmology;  Laterality: Right;  [place] 01:26 AP% 18.5 CDE 16.12 Fluid pack lot # [id number]  . DILATION AND CURETTAGE OF UTERUS     miscarragesx2  . ESOPHAGOGASTRODUODENOSCOPY     Polyps  . [place]  2006  . KNEE ARTHROSCOPY WITH LATERAL MENISECTOMY Left 12/30/2012   Procedure: KNEE ARTHROSCOPY WITH LATERAL  MENISECTOMY;  Surgeon: Johnny Bridge, MD;  Location: [place];  Service: Orthopedics;  Laterality: Left;  LEFT KNEE SCOPE LATERAL MENISCECTOMY  . MOUTH SURGERY    . skin cancer eyelid  2012  . TONSILLECTOMY      Family History  Problem Relation Age of Onset  . Cancer Mother     Colon  . Heart disease Father     CAD  . Diabetes Sister   . Diabetes Brother   . Leukemia Brother   . Cancer Paternal Aunt     Breast  . Diabetes Sister     Allergies  Allergen Reactions  . Amlodipine Besylate Other (See Comments)    Reaction:  Increases BP  . Antihistamines, Chlorpheniramine-Type Other (See Comments)    Reaction:  Makes pt hyper   . Atorvastatin Other (See Comments)    Reaction:  Myalgias   . Cefuroxime Axetil Other (See  Comments)    Upset stomach  . Ciprofloxacin Other (See Comments)    Upset stomach  . Co Q 10 [Coenzyme Q10] Other (See Comments)    Reaction:  GI upset   . Crestor [Rosuvastatin Calcium] Other (See Comments)    Reaction:  Myalgias   . Dextromethorphan-Guaifenesin Other (See Comments)    Reaction:  Made pt jittery   . Epinephrine Hives  . Ramipril Other (See Comments)    Upset stomach  . Sulfamethoxazole-Trimethoprim Other (See Comments)    Upset stomach  . Vitamin D Analogs Other (See Comments)    Reaction:  GI upset     Current Outpatient Prescriptions on File Prior to Visit  Medication Sig Dispense Refill  . acetaminophen (TYLENOL) 500 MG tablet Take 500 mg by mouth every 6 (six) hours as needed.    [name] Kitchen aspirin EC 81 MG tablet Take 81 mg by mouth daily.    [name] Kitchen atorvastatin (LIPITOR) 10 MG tablet TAKE 1 TABLET(10 MG) BY MOUTH DAILY AT 6 PM 90 tablet 2  . Calcium Carbonate-Vitamin D (CALCIUM 600+D PO) Take 1 tablet by mouth. TAKES 1200 T D 500    . clorazepate (TRANXENE) 7.5 MG tablet Take 1 tablet (7.5 mg total) by mouth daily as needed for anxiety. 30 tablet 0  . irbesartan (AVAPRO) 150 MG tablet TAKE 1 TABLET BY MOUTH EVERY DAY 90 tablet 1  . omeprazole (PRILOSEC) 20 MG capsule Take 1 capsule (20 mg total) by mouth daily as needed (heartburn). 30 capsule 11   Current Facility-Administered Medications on File Prior to Visit  Medication Dose Route Frequency Provider Last Rate Last Dose  . cyanocobalamin ((VITAMIN B-12)) injection 1,000 mcg  1,000 mcg Intramuscular Q30 days Abner Greenspan, MD   1,000 mcg at 02/25/17 1422    BP 122/76   Pulse 67   Temp 98.9 F (37.2 C) (Oral)   Ht 5' 4"  (1.626 m)   Wt 208 lb 1.9 oz (94.4 kg)   SpO2 98%   BMI 35.72 kg/m    Objective:   Physical Exam  Constitutional: She appears well-nourished.  Neck: Neck supple.  Cardiovascular: Normal rate and regular rhythm.   Pulmonary/Chest: Effort normal and breath sounds normal.  Skin: Skin is warm  and dry.  1/2 cm circular, raised, insect bite to right medial thigh. Mild erythema. Non tender. No surrounding erythema or firmness around site.          Assessment & Plan:  Tick Bite:  Located to right medial thigh x 4 days. Appears to be healing well. Exam  today without evidence of erythema migrans or cellulitis. Discussed to apply cool compresses for inflammation. Discussed systemic signs to report.   Sheral Flow, NP

## 2017-03-09 NOTE — Progress Notes (Signed)
Pre visit review using our clinic review tool, if applicable. No additional management support is needed unless otherwise documented below in the visit note. 

## 2017-03-09 NOTE — Patient Instructions (Signed)
Your tick bite does not appear infectious. There is no evidence of Lyme Disease.   You may apply cool compresses to the site to reduce swelling and redness.   Please notify us if you develop fevers, chills, fatigue, severe headaches.  It was a pleasure meeting you!

## 2017-03-12 ENCOUNTER — Ambulatory Visit
Admission: RE | Admit: 2017-03-12 | Discharge: 2017-03-12 | Disposition: A | Discharge status: Home / Self Care | Payer: Medicare Other | Source: Ambulatory Visit | Attending: Cardiovascular Disease | Admitting: Cardiovascular Disease

## 2017-03-12 ENCOUNTER — Ambulatory Visit (INDEPENDENT_AMBULATORY_CARE_PROVIDER_SITE_OTHER): Payer: Medicare Other | Admitting: Cardiovascular Disease

## 2017-03-12 ENCOUNTER — Other Ambulatory Visit
Admission: RE | Admit: 2017-03-12 | Discharge: 2017-03-12 | Disposition: A | Discharge status: Home / Self Care | Payer: Medicare Other | Source: Ambulatory Visit | Attending: Cardiovascular Disease | Admitting: Cardiovascular Disease

## 2017-03-12 ENCOUNTER — Encounter: Payer: Self-pay | Admitting: Cardiovascular Disease

## 2017-03-12 VITALS — BP 200/78 | HR 66 | Ht 64.0 in | Wt 208.0 lb

## 2017-03-12 DIAGNOSIS — Z01812 Encounter for preprocedural laboratory examination: Secondary | ICD-10-CM | POA: Diagnosis not present

## 2017-03-12 DIAGNOSIS — E785 Hyperlipidemia, unspecified: Secondary | ICD-10-CM

## 2017-03-12 DIAGNOSIS — Z01818 Encounter for other preprocedural examination: Secondary | ICD-10-CM | POA: Diagnosis not present

## 2017-03-12 DIAGNOSIS — I10 Essential (primary) hypertension: Secondary | ICD-10-CM

## 2017-03-12 DIAGNOSIS — I200 Unstable angina: Secondary | ICD-10-CM

## 2017-03-12 DIAGNOSIS — I351 Nonrheumatic aortic (valve) insufficiency: Secondary | ICD-10-CM

## 2017-03-12 DIAGNOSIS — Z452 Encounter for adjustment and management of vascular access device: Secondary | ICD-10-CM | POA: Diagnosis not present

## 2017-03-12 LAB — BASIC METABOLIC PANEL
Anion gap: 7 (ref 5–15)
BUN: 17 mg/dL (ref 6–20)
CO2: 27 mmol/L (ref 22–32)
Calcium: 9.5 mg/dL (ref 8.9–10.3)
Chloride: 107 mmol/L (ref 101–111)
Creatinine, Ser: 0.77 mg/dL (ref 0.44–1.00)
GFR calc Af Amer: >60 mL/min (ref >60)
GFR calc non Af Amer: >60 mL/min (ref >60)
Glucose, Bld: 87 mg/dL (ref 65–99)
Potassium: 3.8 mmol/L (ref 3.5–5.1)
Sodium: 141 mmol/L (ref 135–145)

## 2017-03-12 LAB — CBC
HCT: 38.5 % (ref 35.0–47.0)
Hemoglobin: 13.1 g/dL (ref 12.0–16.0)
MCH: 30.3 pg (ref 26.0–34.0)
MCHC: 33.9 g/dL (ref 32.0–36.0)
MCV: 89.3 fL (ref 80.0–100.0)
Platelets: 291 10*3/uL (ref 150–440)
RBC: 4.31 MIL/uL (ref 3.80–5.20)
RDW: 14.1 % (ref 11.5–14.5)
WBC: 6.6 10*3/uL (ref 3.6–11.0)

## 2017-03-12 LAB — PROTIME-INR
INR: 0.98
Prothrombin Time: 13 seconds (ref 11.4–15.2)

## 2017-03-12 MED ORDER — CHLORTHALIDONE 25 MG PO TABS: 25.0000 mg | ORAL_TABLET | Freq: Every day | ORAL | 3 refills | Status: DC

## 2017-03-12 NOTE — Patient Instructions (Signed)
Medication Instructions:  Your physician has recommended you make the following change in your medication:  START taking chlorthalidone 17m once daily   Labwork: BMET, CBC, PT[facility]R [facility]  Testing/Procedures: A chest x-ray takes a picture of the organs and structures inside the chest, including the heart, lungs, and blood vessels. This test can show several things, including, whether the heart is enlarges; whether fluid is building up in the lungs; and whether pacemaker / defibrillator leads are still in place. [facility] you have a cardiac catheterization. Cardiac catheterization is used to diagnose and/or treat various heart conditions. Doctors may recommend this procedure for a number of different reasons. The most common reason is to evaluate chest pain. Chest pain can be a symptom of coronary artery disease (CAD), and cardiac catheterization can show whether plaque is narrowing or blocking your heart's arteries. This procedure is also used to evaluate the valves, as well as measure the blood flow and oxygen levels in different parts of your heart. For further information please visit [email or web address] Please follow instruction sheet, as given.  [facility]   You are scheduled for a Cardiac Cath on: Monday, April 23  Please arrive at 8:30am on the day of your procedure  Please expect a call from our [facility] pre-[place] you  Do not eat/drink anything after midnight  Someone will need to drive you home  It is recommended someone be with you for the first 24 hours after your procedure  Wear clothes that are easy to get on/off and wear slip on shoes if possible   Medications bring a current list of all medications with you  _xx__ You may take all of your medications the morning of your procedure with enough water to swallow safely   Day of your  procedure: [facility].  Free valet service is available.  After [facility] the [facility] at the registration desk (1st desk on your right) to receive your armband. After receiving your armband someone will escort you to the cardiac cath/special procedures waiting area.  The usual length of stay after your procedure is about 2 to 3 hours.  This can vary.  If you have any questions, please call our office at [phone] or you may [facility] the [facility] at [phone]  Follow-Up: Your physician recommends that you schedule a follow-up appointment in: 2 weeks with Dr. AFletcher Anon    Any Other Special Instructions Will Be Listed Below (If Applicable).     If you need a refill on your cardiac medications before your next appointment, please call your pharmacy.   Angiogram An angiogram, also called angiography, is a procedure used to look at the blood vessels. In this procedure, dye is injected through a long, thin tube (catheter) into an artery. X-rays are then taken. The X-rays will show if there is a blockage or problem in a blood vessel. Tell a health care provider about:  Any allergies you have, including allergies to shellfish or contrast dye.  All medicines you are taking, including vitamins, herbs, eye drops, creams, and over-the-counter medicines.  Any problems you or family members have had with anesthetic medicines.  Any blood disorders you have.  Any surgeries you have had.  Any previous kidney problems or failure you have had.  Any medical conditions you have.  Possibility of pregnancy, if  this applies. What are the risks? Generally, an angiogram is a safe procedure. However, as with any procedure, problems can occur. Possible problems include:  Injury to the blood vessels, including rupture or bleeding.  Infection or bruising at the catheter site.  Allergic reaction to the dye or contrast  used.  Kidney damage from the dye or contrast used.  Blood clots that can lead to a stroke or heart attack. What happens before the procedure?  Do not eat or drink after midnight on the night before the procedure, or as directed by your health care provider.  Ask your health care provider if you may drink enough water to take any needed medicines the morning of the procedure. What happens during the procedure?  You may be given a medicine to help you relax (sedative) before and during the procedure. This medicine is given through an IV access tube that is inserted into one of your veins.  The area where the catheter will be inserted will be washed and shaved. This is usually done in the groin but may be done in the fold of your arm (near your elbow) or in the wrist.  A medicine will be given to numb the area where the catheter will be inserted (local anesthetic).  The catheter will be inserted with a guide wire into an artery. The catheter is guided by using a type of X-ray (fluoroscopy) to the blood vessel being examined.  Dye is then injected into the catheter, and X-rays are taken. The dye helps to show where any narrowing or blockages are located. What happens after the procedure?  If the procedure is done through the leg, you will be kept in bed lying flat for several hours. You will be instructed to not bend or cross your legs.  The insertion site will be checked frequently.  The pulse in your feet or wrist will be checked frequently.  Additional blood tests, X-rays, and electrocardiography may be done.  You may need to stay in the hospital overnight for observation. This information is not intended to replace advice given to you by your health care provider. Make sure you discuss any questions you have with your health care provider. Document Released: 08/19/2005 Document Revised: 04/22/2016 Document Reviewed: 04/12/2013 Elsevier Interactive Patient Education  2017 [employer] After This sheet gives you information about how to care for yourself after your procedure. Your doctor may also give you more specific instructions. If you have problems or questions, contact your doctor. Follow these instructions at home: Insertion site care   Follow instructions from your doctor about how to take care of your long, thin tube (catheter) insertion area. Make sure you:  Wash your hands with soap and water before you change your bandage (dressing). If you cannot use soap and water, use hand sanitizer.  Change your bandage as told by your doctor.  Leave stitches (sutures), skin glue, or skin tape (adhesive) strips in place. They may need to stay in place for 2 weeks or longer. If tape strips get loose and curl up, you may trim the loose edges. Do not remove tape strips completely unless your doctor says it is okay.  Do not take baths, swim, or use a hot tub until your doctor says it is okay.  You may shower 24-48 hours after the procedure or as told by your doctor.  Gently wash the area with plain soap and water.  Pat the area dry with a clean towel.  Do not rub the area. This may cause bleeding.  Do not apply powder or lotion to the area. Keep the area clean and dry.  Check your insertion area every day for signs of infection. Check for:  More redness, swelling, or pain.  Fluid or blood.  Warmth.  Pus or a bad smell. Activity   Rest as told by your doctor, usually for 1-2 days.  Do not lift anything that is heavier than 10 lbs. (4.5 kg) or as told by your doctor.  Do not drive for 24 hours if you were given a medicine to help you relax (sedative).  Do not drive or use heavy machinery while taking prescription pain medicine. General instructions   Go back to your normal activities as told by your doctor, usually in about a week. Ask your doctor what activities are safe for you.  If the insertion area starts to bleed, lie flat and  put pressure on the area. If the bleeding does not stop, get help right away. This is an emergency.  Drink enough fluid to keep your pee (urine) clear or pale yellow.  Take over-the-counter and prescription medicines only as told by your doctor.  Keep all follow-up visits as told by your doctor. This is important. Contact a doctor if:  You have a fever.  You have chills.  You have more redness, swelling, or pain around your insertion area.  You have fluid or blood coming from your insertion area.  The insertion area feels warm to the touch.  You have pus or a bad smell coming from your insertion area.  You have more bruising around the insertion area.  Blood collects in the tissue around the insertion area (hematoma) that may be painful to the touch. Get help right away if:  You have a lot of pain in the insertion area.  The insertion area swells very fast.  The insertion area is bleeding, and the bleeding does not stop after holding steady pressure on the area.  The area near or just beyond the insertion area becomes pale, cool, tingly, or numb. These symptoms may be an emergency. Do not wait to see if the symptoms will go away. Get medical help right away. Call your local emergency services (911 in the [place] Do not drive yourself to the hospital. Summary  After the procedure, it is common to have bruising and tenderness at the long, thin tube insertion area.  After the procedure, it is important to rest and drink plenty of fluids.  Do not take baths, swim, or use a hot tub until your doctor says it is okay to do so. You may shower 24-48 hours after the procedure or as told by your doctor.  If the insertion area starts to bleed, lie flat and put pressure on the area. If the bleeding does not stop, get help right away. This is an emergency. This information is not intended to replace advice given to you by your health care provider. Make sure you discuss any questions you  have with your health care provider. Document Released: 02/05/2009 Document Revised: 11/03/2016 Document Reviewed: 11/03/2016 Elsevier Interactive Patient Education  2017 [employer].

## 2017-03-12 NOTE — Progress Notes (Signed)
Cardiology Office Note   Date:  03/12/2017   ID:  Tammy Manning, DOB 1935-04-25, MRN [id number]  PCP:  Loura Pardon, MD  Cardiologist:   Kathlyn Sacramento, MD   Chief Complaint  Patient presents with  . other    Pt. c/o chest heaviness and shortness of breath. Meds reviewed by the pt. verbally.       History of Present Illness: Tammy Manning is a 82 y.o. female who presents for a follow-up visit regarding chest pain and mild aortic insufficiency. She was seen by me in the past for palpitations. She has known history of hypertension and osteoarthritis.  She was hospitalized at [facility] in October, 2016 for chest pain. she underwent a pharmacologic nuclear stress test done which showed normal perfusion and ejection fraction.  Her symptoms were felt to be due to stress And uncontrolled hypertension. She was placed on hydrochlorothiazide at that time but she only took it for a month. Echocardiogram in November 2016 showed normal LV systolic function, grade 1 diastolic dysfunction, mild aortic regurgitation, mild mitral regurgitation and normal pulmonary pressure. During last visit, she was started on metoprolol for blood pressure control and chest pain. However, she stopped the medication due to leg swelling. She has intolerance to multiple medications. She called our office recently with elevated blood pressure. I reviewed her blood pressure readings at home and they have been going frequently to the 150 and 160 range. She noted recurrent episodes of exertional chest tightness and shortness of breath which is currently happening with minimal activities. She notices this when she takes her dog for a walk. He feels extremely tired.  Past Medical History:  Diagnosis Date  . Anginal pain ([place])   . Arthritis    Knees  . B12 deficiency    Mild  . Bucket-handle tear of lateral meniscus of left knee as current injury 12/30/2012  . Cancer (HCC)    Skin, basal cell on nose and eyelid  . DJD  (degenerative joint disease)    Low back  . Dyspepsia   . GERD (gastroesophageal reflux disease)   . Heart murmur   . Heart palpitations   . HOH (hard of hearing)   . Hyperlipidemia   . Hypertension   . Lactose intolerance   . Mixed incontinence   . PONV (postoperative nausea and vomiting)   . Vulvar irritation    from urine pad, uses Triamcinolone oint.    Past Surgical History:  Procedure Laterality Date  . ABDOMINAL HYSTERECTOMY  1982   Large fibroids and bleeding  . APPENDECTOMY    . BILATERAL SALPINGOOPHORECTOMY  1986  . BONE GRAFT HIP ILIAC CREST     To Left arm  . BREAST REDUCTION SURGERY    . BREAST SURGERY    . CATARACT EXTRACTION W/PHACO Left 07/07/2016   Procedure: CATARACT EXTRACTION PHACO AND INTRAOCULAR LENS PLACEMENT (IOC);  Surgeon: Birder Robson, MD;  Location: [facility];  Service: Ophthalmology;  Laterality: Left;  [place] 01:10 AP% 18.3 CDE 12.91 Fluid pak lot # [id number]  . CATARACT EXTRACTION W/PHACO Right 07/28/2016   Procedure: CATARACT EXTRACTION PHACO AND INTRAOCULAR LENS PLACEMENT ([place]);  Surgeon: Birder Robson, MD;  Location: [facility];  Service: Ophthalmology;  Laterality: Right;  [place] 01:26 AP% 18.5 CDE 16.12 Fluid pack lot # [id number]  . DILATION AND CURETTAGE OF UTERUS     miscarragesx2  . ESOPHAGOGASTRODUODENOSCOPY     Polyps  . [place]  2006  . KNEE ARTHROSCOPY WITH LATERAL MENISECTOMY Left  12/30/2012   Procedure: KNEE ARTHROSCOPY WITH LATERAL MENISECTOMY;  Surgeon: Johnny Bridge, MD;  Location: [place];  Service: Orthopedics;  Laterality: Left;  LEFT KNEE SCOPE LATERAL MENISCECTOMY  . MOUTH SURGERY    . skin cancer eyelid  2012  . TONSILLECTOMY       Current Outpatient Prescriptions  Medication Sig Dispense Refill  . acetaminophen (TYLENOL) 500 MG tablet Take 500 mg by mouth every 6 (six) hours as needed.    [name] Kitchen aspirin EC 81 MG tablet Take 81 mg by mouth daily.    [name] Kitchen atorvastatin (LIPITOR) 10 MG tablet TAKE 1  TABLET(10 MG) BY MOUTH DAILY AT 6 PM 90 tablet 2  . Calcium Carbonate-Vitamin D (CALCIUM 600+D PO) Take 1 tablet by mouth. TAKES 1200 T D 500    . clorazepate (TRANXENE) 7.5 MG tablet Take 1 tablet (7.5 mg total) by mouth daily as needed for anxiety. 30 tablet 0  . irbesartan (AVAPRO) 150 MG tablet TAKE 1 TABLET BY MOUTH EVERY DAY 90 tablet 1  . omeprazole (PRILOSEC) 20 MG capsule Take 1 capsule (20 mg total) by mouth daily as needed (heartburn). 30 capsule 11   Current Facility-Administered Medications  Medication Dose Route Frequency Provider Last Rate Last Dose  . cyanocobalamin ((VITAMIN B-12)) injection 1,000 mcg  1,000 mcg Intramuscular Q30 days Abner Greenspan, MD   1,000 mcg at 02/25/17 1422    Allergies:   Amlodipine besylate; Antihistamines, chlorpheniramine-type; Atorvastatin; Cefuroxime axetil; Ciprofloxacin; Co q 10 [coenzyme q10]; Crestor [rosuvastatin calcium]; Dextromethorphan-guaifenesin; Epinephrine; Ramipril; Sulfamethoxazole-trimethoprim; and Vitamin d analogs    Social History:  The patient  reports that she quit smoking about 53 years ago. Her smoking use included Cigarettes. She has a 12.00 pack-year smoking history. She has never used smokeless tobacco. She reports that she does not drink alcohol or use drugs.   Family History:  The patient's family history includes Cancer in her mother and paternal aunt; Diabetes in her brother, sister, and sister; Heart disease in her father; Leukemia in her brother.    ROS:  Please see the history of present illness.   Otherwise, review of systems are positive for none.   All other systems are reviewed and negative.    PHYSICAL EXAM: VS:  BP 130/72 (BP Location: Left Arm, Patient Position: Sitting, Cuff Size: Normal)   Pulse 66   Ht 5' 4"  (1.626 m)   Wt 208 lb (94.3 kg)   BMI 35.70 kg/m  , BMI Body mass index is 35.7 kg/m. GEN: Well nourished, well developed, in no acute distress  HEENT: normal  Neck: no JVD, carotid bruits,  or masses Cardiac: RRR; no  rubs, or gallops,no edema . 2/6 decrescendo diastolic murmur at the aortic area and left sternal border. Respiratory:  clear to auscultation bilaterally, normal work of breathing GI: soft, nontender, nondistended, + BS MS: no deformity or atrophy  Skin: warm and dry, no rash Neuro:  Strength and sensation are intact Psych: euthymic mood, full affect Distal pulses are normal.   EKG:  EKG is ordered today. The ekg ordered today demonstrates normal sinus rhythm with no significant ST or T wave changes.    Recent Labs: No results found for requested labs within last 8760 hours.    Lipid Panel    Component Value Date/Time   CHOL 203 (H) 03/05/2016 0932   TRIG 90.0 03/05/2016 0932   HDL 68.10 03/05/2016 0932   CHOLHDL 3 03/05/2016 0932   VLDL 18.0 03/05/2016 0932  LDLCALC 117 (H) 03/05/2016 0932   LDLDIRECT 105.9 02/08/2013 1050      Wt Readings from Last 3 Encounters:  03/12/17 208 lb (94.3 kg)  03/09/17 208 lb 1.9 oz (94.4 kg)  10/21/16 206 lb (93.4 kg)       No flowsheet data found.    ASSESSMENT AND PLAN:  1.  Unstable angina: The patient reports new symptoms of chest tightness with minimal activities and extreme fatigue. This is concerning and the symptoms have been consistent. Previous stress test in 2016 was unremarkable. Given her current symptoms, I recommend proceeding with cardiac catheterization and possible coronary intervention. I discussed the procedure in details as well as risks and benefits.   2. Aortic insufficiency: This was mild on most recent echocardiogram. She continues to have a diastolic aortic murmur.  3. Essential hypertension:  Blood pressure is extremely high today. She has intolerance to multiple medications most recently Toprol. I elected to add chlorthalidone 25 mg once daily. Continue treatment with Avapro. Renal angiography can be considered during catheterization.  4. Hyperlipidemia: Currently she is on  atorvastatin 10 mg daily.    Disposition:   FU with me in 1 months  Signed,  Kathlyn Sacramento, MD  03/12/2017 10:21 AM    [place]

## 2017-03-15 ENCOUNTER — Telehealth: Payer: Self-pay | Admitting: Cardiovascular Disease

## 2017-03-15 ENCOUNTER — Ambulatory Visit
Admission: RE | Admit: 2017-03-15 | Discharge: 2017-03-15 | Disposition: A | Discharge status: Home / Self Care | Payer: Medicare Other | Source: Ambulatory Visit | Attending: Cardiovascular Disease | Admitting: Cardiovascular Disease

## 2017-03-15 ENCOUNTER — Encounter: Payer: Self-pay | Admitting: *Deleted

## 2017-03-15 ENCOUNTER — Other Ambulatory Visit: Payer: Self-pay

## 2017-03-15 ENCOUNTER — Ambulatory Visit: Payer: Medicare Other | Admitting: Physician Assistant

## 2017-03-15 ENCOUNTER — Encounter
Admission: RE | Disposition: A | Discharge status: Home / Self Care | Payer: Self-pay | Source: Ambulatory Visit | Attending: Cardiovascular Disease

## 2017-03-15 DIAGNOSIS — M170 Bilateral primary osteoarthritis of knee: Secondary | ICD-10-CM | POA: Diagnosis not present

## 2017-03-15 DIAGNOSIS — E538 Deficiency of other specified B group vitamins: Secondary | ICD-10-CM | POA: Diagnosis not present

## 2017-03-15 DIAGNOSIS — I200 Unstable angina: Secondary | ICD-10-CM

## 2017-03-15 DIAGNOSIS — Z87891 Personal history of nicotine dependence: Secondary | ICD-10-CM | POA: Diagnosis not present

## 2017-03-15 DIAGNOSIS — I10 Essential (primary) hypertension: Secondary | ICD-10-CM

## 2017-03-15 DIAGNOSIS — E739 Lactose intolerance, unspecified: Secondary | ICD-10-CM | POA: Insufficient documentation

## 2017-03-15 DIAGNOSIS — E785 Hyperlipidemia, unspecified: Secondary | ICD-10-CM | POA: Insufficient documentation

## 2017-03-15 DIAGNOSIS — I080 Rheumatic disorders of both mitral and aortic valves: Secondary | ICD-10-CM | POA: Diagnosis not present

## 2017-03-15 DIAGNOSIS — Z7982 Long term (current) use of aspirin: Secondary | ICD-10-CM | POA: Diagnosis not present

## 2017-03-15 DIAGNOSIS — K219 Gastro-esophageal reflux disease without esophagitis: Secondary | ICD-10-CM | POA: Diagnosis not present

## 2017-03-15 DIAGNOSIS — Z8249 Family history of ischemic heart disease and other diseases of the circulatory system: Secondary | ICD-10-CM | POA: Insufficient documentation

## 2017-03-15 DIAGNOSIS — N3946 Mixed incontinence: Secondary | ICD-10-CM | POA: Insufficient documentation

## 2017-03-15 HISTORY — PX: LEFT HEART CATH AND CORONARY ANGIOGRAPHY: CATH118249

## 2017-03-15 HISTORY — PX: ABDOMINAL AORTOGRAM: CATH118222

## 2017-03-15 SURGERY — LEFT HEART CATH AND CORONARY ANGIOGRAPHY
Anesthesia: Moderate Sedation

## 2017-03-15 MED ORDER — ASPIRIN 81 MG PO CHEW: 81.0000 mg | CHEWABLE_TABLET | ORAL | Status: DC

## 2017-03-15 MED ORDER — HEPARIN SODIUM (PORCINE) 1000 UNIT/ML IJ SOLN
INTRAMUSCULAR | Status: AC
  Filled 2017-03-15: qty 1

## 2017-03-15 MED ORDER — HEPARIN (PORCINE) IN NACL 2-0.9 UNIT/ML-% IJ SOLN
INTRAMUSCULAR | Status: AC
  Filled 2017-03-15: qty 500

## 2017-03-15 MED ORDER — SODIUM CHLORIDE 0.9% FLUSH: 3.0000 mL | Freq: Two times a day (BID) | INTRAVENOUS | Status: DC

## 2017-03-15 MED ORDER — FENTANYL CITRATE (PF) 100 MCG/2ML IJ SOLN
INTRAMUSCULAR | Status: DC | PRN
  Administered 2017-03-15: 50 ug via INTRAVENOUS

## 2017-03-15 MED ORDER — LIDOCAINE HCL (PF) 1 % IJ SOLN
INTRAMUSCULAR | Status: AC
  Filled 2017-03-15: qty 30

## 2017-03-15 MED ORDER — SODIUM CHLORIDE 0.9% FLUSH: 3.0000 mL | INTRAVENOUS | Status: DC | PRN

## 2017-03-15 MED ORDER — SODIUM CHLORIDE 0.9 % IV SOLN: 250.0000 mL | INTRAVENOUS | Status: DC | PRN

## 2017-03-15 MED ORDER — FENTANYL CITRATE (PF) 100 MCG/2ML IJ SOLN
INTRAMUSCULAR | Status: AC
  Filled 2017-03-15: qty 2

## 2017-03-15 MED ORDER — SODIUM CHLORIDE 0.9 % IV SOLN
INTRAVENOUS | Status: DC
  Administered 2017-03-15: 1000 mL via INTRAVENOUS

## 2017-03-15 MED ORDER — VERAPAMIL HCL 2.5 MG/ML IV SOLN
INTRAVENOUS | Status: AC
  Filled 2017-03-15: qty 2

## 2017-03-15 MED ORDER — MIDAZOLAM HCL 2 MG/2ML IJ SOLN
INTRAMUSCULAR | Status: AC
  Filled 2017-03-15: qty 2

## 2017-03-15 MED ORDER — MIDAZOLAM HCL 2 MG/2ML IJ SOLN
INTRAMUSCULAR | Status: DC | PRN
  Administered 2017-03-15: 1 mg via INTRAVENOUS

## 2017-03-15 MED ORDER — SODIUM CHLORIDE 0.9 % IV SOLN: INTRAVENOUS | Status: DC

## 2017-03-15 MED ORDER — LIDOCAINE HCL (PF) 1 % IJ SOLN
INTRAMUSCULAR | Status: DC | PRN
  Administered 2017-03-15: 2 mL via SUBCUTANEOUS

## 2017-03-15 SURGICAL SUPPLY — 8 items
CATH 5FR PIGTAIL DIAGNOSTIC (CATHETERS) ×1 IMPLANT
CATH OPTITORQUE JACKY 4.0 5F (CATHETERS) ×1 IMPLANT
DEVICE RAD TR BAND REGULAR (VASCULAR PRODUCTS) ×1 IMPLANT
GLIDESHEATH SLEND SS 6F .021 (SHEATH) ×1 IMPLANT
KIT MANI 3VAL PERCEP (MISCELLANEOUS) ×2 IMPLANT
PACK CARDIAC CATH (CUSTOM PROCEDURE TRAY) ×2 IMPLANT
WIRE HITORQ VERSACORE ST 145CM (WIRE) ×1 IMPLANT
WIRE ROSEN-J .035X260CM (WIRE) ×1 IMPLANT

## 2017-03-15 NOTE — Progress Notes (Signed)
No changes, tr band off, no visible bleeding at this time.

## 2017-03-15 NOTE — H&P (View-Only) (Signed)
Cardiology Office Note   Date:  03/12/2017   ID:  Tammy Manning, DOB [birth year], MRN [id number]  PCP:  Loura Pardon, MD  Cardiologist:   Kathlyn Sacramento, MD   Chief Complaint  Patient presents with  . other    Pt. c/o chest heaviness and shortness of breath. Meds reviewed by the pt. verbally.       History of Present Illness: Tammy Manning is a 82 y.o. female who presents for a follow-up visit regarding chest pain and mild aortic insufficiency. She was seen by me in the past for palpitations. She has known history of hypertension and osteoarthritis.  She was hospitalized at [facility] in October, 2016 for chest pain. she underwent a pharmacologic nuclear stress test done which showed normal perfusion and ejection fraction.  Her symptoms were felt to be due to stress And uncontrolled hypertension. She was placed on hydrochlorothiazide at that time but she only took it for a month. Echocardiogram in November 2016 showed normal LV systolic function, grade 1 diastolic dysfunction, mild aortic regurgitation, mild mitral regurgitation and normal pulmonary pressure. During last visit, she was started on metoprolol for blood pressure control and chest pain. However, she stopped the medication due to leg swelling. She has intolerance to multiple medications. She called our office recently with elevated blood pressure. I reviewed her blood pressure readings at home and they have been going frequently to the 150 and 160 range. She noted recurrent episodes of exertional chest tightness and shortness of breath which is currently happening with minimal activities. She notices this when she takes her dog for a walk. He feels extremely tired.  Past Medical History:  Diagnosis Date  . Anginal pain ([place])   . Arthritis    Knees  . B12 deficiency    Mild  . Bucket-handle tear of lateral meniscus of left knee as current injury 12/30/2012  . Cancer (HCC)    Skin, basal cell on nose and eyelid  . DJD  (degenerative joint disease)    Low back  . Dyspepsia   . GERD (gastroesophageal reflux disease)   . Heart murmur   . Heart palpitations   . HOH (hard of hearing)   . Hyperlipidemia   . Hypertension   . Lactose intolerance   . Mixed incontinence   . PONV (postoperative nausea and vomiting)   . Vulvar irritation    from urine pad, uses Triamcinolone oint.    Past Surgical History:  Procedure Laterality Date  . ABDOMINAL HYSTERECTOMY  1982   Large fibroids and bleeding  . APPENDECTOMY    . BILATERAL SALPINGOOPHORECTOMY  1986  . BONE GRAFT HIP ILIAC CREST     To Left arm  . BREAST REDUCTION SURGERY    . BREAST SURGERY    . CATARACT EXTRACTION W/PHACO Left 07/07/2016   Procedure: CATARACT EXTRACTION PHACO AND INTRAOCULAR LENS PLACEMENT (IOC);  Surgeon: Birder Robson, MD;  Location: [facility] ORS;  Service: Ophthalmology;  Laterality: Left;  [place] 01:10 AP% 18.3 CDE 12.91 Fluid pak lot # [id number]  . CATARACT EXTRACTION W/PHACO Right 07/28/2016   Procedure: CATARACT EXTRACTION PHACO AND INTRAOCULAR LENS PLACEMENT ([place]);  Surgeon: Birder Robson, MD;  Location: [facility];  Service: Ophthalmology;  Laterality: Right;  [place] 01:26 AP% 18.5 CDE 16.12 Fluid pack lot # [id number]  . DILATION AND CURETTAGE OF UTERUS     miscarragesx2  . ESOPHAGOGASTRODUODENOSCOPY     Polyps  . [place]  2006  . KNEE ARTHROSCOPY WITH LATERAL MENISECTOMY Left  12/30/2012   Procedure: KNEE ARTHROSCOPY WITH LATERAL MENISECTOMY;  Surgeon: Johnny Bridge, MD;  Location: [place];  Service: Orthopedics;  Laterality: Left;  LEFT KNEE SCOPE LATERAL MENISCECTOMY  . MOUTH SURGERY    . skin cancer eyelid  2012  . TONSILLECTOMY       Current Outpatient Prescriptions  Medication Sig Dispense Refill  . acetaminophen (TYLENOL) 500 MG tablet Take 500 mg by mouth every 6 (six) hours as needed.    [name] Kitchen aspirin EC 81 MG tablet Take 81 mg by mouth daily.    [name] Kitchen atorvastatin (LIPITOR) 10 MG tablet TAKE 1  TABLET(10 MG) BY MOUTH DAILY AT 6 PM 90 tablet 2  . Calcium Carbonate-Vitamin D (CALCIUM 600+D PO) Take 1 tablet by mouth. TAKES 1200 T D 500    . clorazepate (TRANXENE) 7.5 MG tablet Take 1 tablet (7.5 mg total) by mouth daily as needed for anxiety. 30 tablet 0  . irbesartan (AVAPRO) 150 MG tablet TAKE 1 TABLET BY MOUTH EVERY DAY 90 tablet 1  . omeprazole (PRILOSEC) 20 MG capsule Take 1 capsule (20 mg total) by mouth daily as needed (heartburn). 30 capsule 11   Current Facility-Administered Medications  Medication Dose Route Frequency Provider Last Rate Last Dose  . cyanocobalamin ((VITAMIN B-12)) injection 1,000 mcg  1,000 mcg Intramuscular Q30 days Abner Greenspan, MD   1,000 mcg at 02/25/17 1422    Allergies:   Amlodipine besylate; Antihistamines, chlorpheniramine-type; Atorvastatin; Cefuroxime axetil; Ciprofloxacin; Co q 10 [coenzyme q10]; Crestor [rosuvastatin calcium]; Dextromethorphan-guaifenesin; Epinephrine; Ramipril; Sulfamethoxazole-trimethoprim; and Vitamin d analogs    Social History:  The patient  reports that she quit smoking about 53 years ago. Her smoking use included Cigarettes. She has a 12.00 pack-year smoking history. She has never used smokeless tobacco. She reports that she does not drink alcohol or use drugs.   Family History:  The patient's family history includes Cancer in her mother and paternal aunt; Diabetes in her brother, sister, and sister; Heart disease in her father; Leukemia in her brother.    ROS:  Please see the history of present illness.   Otherwise, review of systems are positive for none.   All other systems are reviewed and negative.    PHYSICAL EXAM: VS:  BP 130/72 (BP Location: Left Arm, Patient Position: Sitting, Cuff Size: Normal)   Pulse 66   Ht 5' 4"  (1.626 m)   Wt 208 lb (94.3 kg)   BMI 35.70 kg/m  , BMI Body mass index is 35.7 kg/m. GEN: Well nourished, well developed, in no acute distress  HEENT: normal  Neck: no JVD, carotid bruits,  or masses Cardiac: RRR; no  rubs, or gallops,no edema . 2/6 decrescendo diastolic murmur at the aortic area and left sternal border. Respiratory:  clear to auscultation bilaterally, normal work of breathing GI: soft, nontender, nondistended, + BS MS: no deformity or atrophy  Skin: warm and dry, no rash Neuro:  Strength and sensation are intact Psych: euthymic mood, full affect Distal pulses are normal.   EKG:  EKG is ordered today. The ekg ordered today demonstrates normal sinus rhythm with no significant ST or T wave changes.    Recent Labs: No results found for requested labs within last 8760 hours.    Lipid Panel    Component Value Date/Time   CHOL 203 (H) 03/05/2016 0932   TRIG 90.0 03/05/2016 0932   HDL 68.10 03/05/2016 0932   CHOLHDL 3 03/05/2016 0932   VLDL 18.0 03/05/2016 0932  LDLCALC 117 (H) 03/05/2016 0932   LDLDIRECT 105.9 02/08/2013 1050      Wt Readings from Last 3 Encounters:  03/12/17 208 lb (94.3 kg)  03/09/17 208 lb 1.9 oz (94.4 kg)  10/21/16 206 lb (93.4 kg)       No flowsheet data found.    ASSESSMENT AND PLAN:  1.  Unstable angina: The patient reports new symptoms of chest tightness with minimal activities and extreme fatigue. This is concerning and the symptoms have been consistent. Previous stress test in 2016 was unremarkable. Given her current symptoms, I recommend proceeding with cardiac catheterization and possible coronary intervention. I discussed the procedure in details as well as risks and benefits.   2. Aortic insufficiency: This was mild on most recent echocardiogram. She continues to have a diastolic aortic murmur.  3. Essential hypertension:  Blood pressure is extremely high today. She has intolerance to multiple medications most recently Toprol. I elected to add chlorthalidone 25 mg once daily. Continue treatment with Avapro. Renal angiography can be considered during catheterization.  4. Hyperlipidemia: Currently she is on  atorvastatin 10 mg daily.    Disposition:   FU with me in 1 months  Signed,  Kathlyn Sacramento, MD  03/12/2017 10:21 AM    [place]

## 2017-03-15 NOTE — Progress Notes (Signed)
Patient remains clinically stable post heart cath, husband at bedside. Tr band being deflated per protocol with no visible bleeding. Vitals have remained stable. Sinus rhythm per monitor.discharge instructions given with questions answered,

## 2017-03-15 NOTE — Interval H&P Note (Signed)
History and Physical Interval Note:  03/15/2017 9:42 AM  Tammy Manning  has presented today for surgery, with the diagnosis of Left heart Cath d/t unstable angina  The various methods of treatment have been discussed with the patient and family. After consideration of risks, benefits and other options for treatment, the patient has consented to  Procedure(s): Left Heart Cath and Coronary Angiography (N/A) as a surgical intervention .  The patient's history has been reviewed, patient examined, no change in status, stable for surgery.  I have reviewed the patient's chart and labs.  Questions were answered to the patient's satisfaction.     Kathlyn Sacramento

## 2017-03-15 NOTE — Telephone Encounter (Signed)
Had cath today. Needs BMP on thursday or Friday of this week due to starting chlorthalidone last week. Pt agreeable to labs at [facility] outpatient lab. Order placed.

## 2017-03-15 NOTE — Discharge Instructions (Signed)
Moderate Conscious Sedation, Adult, Care After These instructions provide you with information about caring for yourself after your procedure. Your health care provider may also give you more specific instructions. Your treatment has been planned according to current medical practices, but problems sometimes occur. Call your health care provider if you have any problems or questions after your procedure. What can I expect after the procedure? After your procedure, it is common:  To feel sleepy for several hours.  To feel clumsy and have poor balance for several hours.  To have poor judgment for several hours.  To vomit if you eat too soon. Follow these instructions at home: For at least 24 hours after the procedure:    Do not:  Participate in activities where you could fall or become injured.  Drive.  Use heavy machinery.  Drink alcohol.  Take sleeping pills or medicines that cause drowsiness.  Make important decisions or sign legal documents.  Take care of children on your own.  Rest. Eating and drinking   Follow the diet recommended by your health care provider.  If you vomit:  Drink water, juice, or soup when you can drink without vomiting.  Make sure you have little or no nausea before eating solid foods. General instructions   Have a responsible adult stay with you until you are awake and alert.  Take over-the-counter and prescription medicines only as told by your health care provider.  If you smoke, do not smoke without supervision.  Keep all follow-up visits as told by your health care provider. This is important. Contact a health care provider if:  You keep feeling nauseous or you keep vomiting.  You feel light-headed.  You develop a rash.  You have a fever. Get help right away if:  You have trouble breathing. This information is not intended to replace advice given to you by your health care provider. Make sure you discuss any questions you  have with your health care provider. Document Released: 08/30/2013 Document Revised: 04/13/2016 Document Reviewed: 02/29/2016 Elsevier Interactive Patient Education  2017 [place] Refer to this sheet in the next few weeks. These instructions provide you with information about caring for yourself after your procedure. Your health care provider may also give you more specific instructions. Your treatment has been planned according to current medical practices, but problems sometimes occur. Call your health care provider if you have any problems or questions after your procedure. What can I expect after the procedure? After your procedure, it is typical to have the following:  Bruising at the radial site that usually fades within 1-2 weeks.  Blood collecting in the tissue (hematoma) that may be painful to the touch. It should usually decrease in size and tenderness within 1-2 weeks. Follow these instructions at home:  Take medicines only as directed by your health care provider.  You may shower 24-48 hours after the procedure or as directed by your health care provider. Remove the bandage (dressing) and gently wash the site with plain soap and water. Pat the area dry with a clean towel. Do not rub the site, because this may cause bleeding.  Do not take baths, swim, or use a hot tub until your health care provider approves.  Check your insertion site every day for redness, swelling, or drainage.  Do not apply powder or lotion to the site.  Do not flex or bend the affected arm for 24 hours or as directed by your health care provider.  Do  not push or pull heavy objects with the affected arm for 24 hours or as directed by your health care provider.  Do not lift over 10 lb (4.5 kg) for 5 days after your procedure or as directed by your health care provider.  Ask your health care provider when it is okay to:  Return to work or school.  Resume usual physical activities  or sports.  Resume sexual activity.  Do not drive home if you are discharged the same day as the procedure. Have someone else drive you.  You may drive 24 hours after the procedure unless otherwise instructed by your health care provider.  Do not operate machinery or power tools for 24 hours after the procedure.  If your procedure was done as an outpatient procedure, which means that you went home the same day as your procedure, a responsible adult should be with you for the first 24 hours after you arrive home.  Keep all follow-up visits as directed by your health care provider. This is important. Contact a health care provider if:  You have a fever.  You have chills.  You have increased bleeding from the radial site. Hold pressure on the site. Get help right away if:  You have unusual pain at the radial site.  You have redness, warmth, or swelling at the radial site.  You have drainage (other than a small amount of blood on the dressing) from the radial site.  The radial site is bleeding, and the bleeding does not stop after 30 minutes of holding steady pressure on the site.  Your arm or hand becomes pale, cool, tingly, or numb. This information is not intended to replace advice given to you by your health care provider. Make sure you discuss any questions you have with your health care provider. Document Released: 12/12/2010 Document Revised: 04/16/2016 Document Reviewed: 05/28/2014 Elsevier Interactive Patient Education  2017 [employer].

## 2017-03-18 ENCOUNTER — Other Ambulatory Visit
Admission: RE | Admit: 2017-03-18 | Discharge: 2017-03-18 | Disposition: A | Discharge status: Home / Self Care | Payer: Medicare Other | Source: Ambulatory Visit | Attending: Cardiovascular Disease | Admitting: Cardiovascular Disease

## 2017-03-18 ENCOUNTER — Other Ambulatory Visit: Payer: Self-pay

## 2017-03-18 DIAGNOSIS — I10 Essential (primary) hypertension: Secondary | ICD-10-CM | POA: Insufficient documentation

## 2017-03-18 DIAGNOSIS — E871 Hypo-osmolality and hyponatremia: Secondary | ICD-10-CM

## 2017-03-18 LAB — BASIC METABOLIC PANEL
Anion gap: 10 (ref 5–15)
BUN: 23 mg/dL — ABNORMAL HIGH (ref 6–20)
CO2: 23 mmol/L (ref 22–32)
Calcium: 9.4 mg/dL (ref 8.9–10.3)
Chloride: 100 mmol/L — ABNORMAL LOW (ref 101–111)
Creatinine, Ser: 0.86 mg/dL (ref 0.44–1.00)
GFR calc Af Amer: >60 mL/min (ref >60)
GFR calc non Af Amer: >60 mL/min (ref >60)
Glucose, Bld: 123 mg/dL — ABNORMAL HIGH (ref 65–99)
Potassium: 3.9 mmol/L (ref 3.5–5.1)
Sodium: 133 mmol/L — ABNORMAL LOW (ref 135–145)

## 2017-03-23 ENCOUNTER — Ambulatory Visit: Payer: Medicare Other | Admitting: Cardiovascular Disease

## 2017-03-25 ENCOUNTER — Telehealth: Payer: Self-pay | Admitting: *Deleted

## 2017-03-25 MED ORDER — NEBIVOLOL HCL 5 MG PO TABS: 5.0000 mg | ORAL_TABLET | Freq: Every day | ORAL | 3 refills | Status: DC

## 2017-03-25 NOTE — Telephone Encounter (Signed)
Returned call to patient. She verbalized understanding to stay off chlorthalidone and start Bystolic 5 mg by mouth once a day. Rx sent to verified pharmacy.

## 2017-03-25 NOTE — Telephone Encounter (Signed)
Returned call to patient. She was prescribed chlorthalidone 25 mg by mouth on 03/12/17 by Dr Fletcher Anon. She says ever since she started taking it, it has "made me sick and my muscles are in knots." States she was told to eat foods with potassium which she did by eating bananas and oranges. This did not help. She took the last dose of chlorthalidone on Friday, 03/19/17. It took about 3 days but the weakness has subsided now. She also had an episode fron 4/27 to 4/28 where her pulse was "racing" and she could feel her pulse in her wrist going from light to heavy. She blew on her thumb 3 times and it came back down and stopped. She took her Tranxene to help her calm down but it did not calm down her racing pulse. This has not happened since. Denies chest pain, SOB, or palpitations now or during these episodes.  Recent BP's: 5/3 160/67, HR 70 - prior to taking avapro 5/2 140/65, HR 80 5/1 138/62, HR 83 4/30 146/61, HR 76  She verbalized understanding to go to ER or call 911 if she gets racing heart rate again and/or symptoms of chest pain, SOB, palpitations, nausea or vomiting.   Had left heart cath on 03/15/17.  Next appt is 03/30/17 with Dr Fletcher Anon. Routing to Dr Fletcher Anon.

## 2017-03-25 NOTE — Telephone Encounter (Signed)
Tammy Manning      03/25/17 8:44 AM  Note    Pt c/o medication issue:  1. Name of Medication: Fluid pills   2. How are you currently taking this medication (dosage and times per day)? 25 mg 1 once day   3. Are you having a reaction (difficulty breathing--STAT)? no  4. What is your medication issue? Pt calling stating it just made her really weak,muscles would cramp up  She states she's called before on this and was advised to have some potassium with it but she states its not helping She's stopped taking Sunday and she won't be taking them any more Please advise.

## 2017-03-25 NOTE — Telephone Encounter (Signed)
Ok to stop Chlarthalidone. Start Bystolic 5 mg daily.

## 2017-03-29 ENCOUNTER — Telehealth: Payer: Self-pay | Admitting: Cardiovascular Disease

## 2017-03-29 NOTE — Telephone Encounter (Signed)
Pt states she has a question regarding her Bystolic. She is not sure if she should take this with the Avapro. States she has not taken the Bystolic this weekend. Please call and advise.

## 2017-03-29 NOTE — Telephone Encounter (Signed)
Returned call to patient. She states she has not taken the Bystolic yet. She wanted to know if it was ok to take along with her Avapro. According to telephone not from 03/25/17, Dr Fletcher Anon stated: "Wellington Hampshire, MD  to Me     03/25/17 2:30 PM  Note    Ok to stop Chlarthalidone. Start Bystolic 5 mg daily. "      Dr Fletcher Anon did not say to stop Avapro. Patient advised to start Bystolic and verbalized understanding. She is aware of appt date and time for tomorrow.

## 2017-03-30 ENCOUNTER — Ambulatory Visit (INDEPENDENT_AMBULATORY_CARE_PROVIDER_SITE_OTHER): Payer: Medicare Other | Admitting: Cardiovascular Disease

## 2017-03-30 ENCOUNTER — Encounter: Payer: Self-pay | Admitting: Cardiovascular Disease

## 2017-03-30 VITALS — BP 126/60 | HR 66 | Ht 64.0 in | Wt 206.0 lb

## 2017-03-30 DIAGNOSIS — E785 Hyperlipidemia, unspecified: Secondary | ICD-10-CM | POA: Diagnosis not present

## 2017-03-30 DIAGNOSIS — I200 Unstable angina: Secondary | ICD-10-CM

## 2017-03-30 DIAGNOSIS — I10 Essential (primary) hypertension: Secondary | ICD-10-CM | POA: Diagnosis not present

## 2017-03-30 DIAGNOSIS — I351 Nonrheumatic aortic (valve) insufficiency: Secondary | ICD-10-CM

## 2017-03-30 NOTE — Patient Instructions (Signed)
Medication Instructions: Continue same medications.   Labwork: None.   Procedures/Testing: None.   Follow-Up: 6 months with Dr. Arida.   Any Additional Special Instructions Will Be Listed Below (If Applicable).     If you need a refill on your cardiac medications before your next appointment, please call your pharmacy.   

## 2017-03-30 NOTE — Progress Notes (Signed)
Cardiology Office Note   Date:  03/30/2017   ID:  Tammy Manning, DOB Mar 24, 1935, MRN [id number]  PCP:  Abner Greenspan, MD  Cardiologist:   Kathlyn Sacramento, MD   Chief Complaint  Patient presents with  . OTHER    Post cardiac cath c/o fatigue/feeling drianed. Meds reviewed verbally with pt.      History of Present Illness: Tammy Manning is a 82 y.o. female who presents for a follow-up visit regarding chest pain and mild aortic insufficiency. She was seen by me in the past for palpitations. She has known history of hypertension and osteoarthritis.  She was hospitalized at [facility] in October, 2016 for chest pain. she underwent a pharmacologic nuclear stress test done which showed normal perfusion and ejection fraction.  Her symptoms were felt to be due to stress and uncontrolled hypertension.  Echocardiogram in November 2016 showed normal LV systolic function, grade 1 diastolic dysfunction, mild aortic regurgitation, mild mitral regurgitation and normal pulmonary pressure. She has intolerance to multiple antihypertensive medications. She was seen recently for exertional chest tightness and shortness of breath worrisome for angina. I proceeded with cardiac catheterization few weeks ago which showed normal coronary arteries, normal ejection fraction, mildly calcified aortic valve with no significant stenosis and mildly elevated left ventricular end-diastolic pressure. Abdominal aortogram was performed which showed no evidence of renal artery stenosis or aortic aneurysm. She was started on chlorthalidone for uncontrolled hypertension. She did not tolerate the medication due to muscle cramps. She was switched to Bystolic. She has been doing well and denies any chest pain or shortness of breath. Blood pressure is now controlled although she does complain of fatigue.  Past Medical History:  Diagnosis Date  . Anginal pain ([place])   . Arthritis    Knees  . B12 deficiency    Mild  .  Bucket-handle tear of lateral meniscus of left knee as current injury 12/30/2012  . Cancer (HCC)    Skin, basal cell on nose and eyelid  . DJD (degenerative joint disease)    Low back  . Dyspepsia   . GERD (gastroesophageal reflux disease)   . Heart murmur   . Heart palpitations   . HOH (hard of hearing)   . Hyperlipidemia   . Hypertension   . Lactose intolerance   . Mixed incontinence   . PONV (postoperative nausea and vomiting)   . Vulvar irritation    from urine pad, uses Triamcinolone oint.    Past Surgical History:  Procedure Laterality Date  . ABDOMINAL AORTOGRAM N/A 03/15/2017   Procedure: Abdominal Aortogram;  Surgeon: Wellington Hampshire, MD;  Location: [facility];  Service: Cardiovascular;  Laterality: N/A;  . ABDOMINAL HYSTERECTOMY  1982   Large fibroids and bleeding  . APPENDECTOMY    . BILATERAL SALPINGOOPHORECTOMY  1986  . BONE GRAFT HIP ILIAC CREST     To Left arm  . BREAST REDUCTION SURGERY    . BREAST SURGERY    . CATARACT EXTRACTION W/PHACO Left 07/07/2016   Procedure: CATARACT EXTRACTION PHACO AND INTRAOCULAR LENS PLACEMENT (IOC);  Surgeon: Birder Robson, MD;  Location: [facility];  Service: Ophthalmology;  Laterality: Left;  [place] 01:10 AP% 18.3 CDE 12.91 Fluid pak lot # [id number]  . CATARACT EXTRACTION W/PHACO Right 07/28/2016   Procedure: CATARACT EXTRACTION PHACO AND INTRAOCULAR LENS PLACEMENT ([place]);  Surgeon: Birder Robson, MD;  Location: [facility];  Service: Ophthalmology;  Laterality: Right;  [place] 01:26 AP% 18.5 CDE 16.12 Fluid pack lot # [id number]  .  DILATION AND CURETTAGE OF UTERUS     miscarragesx2  . ESOPHAGOGASTRODUODENOSCOPY     Polyps  . [place]  2006  . KNEE ARTHROSCOPY WITH LATERAL MENISECTOMY Left 12/30/2012   Procedure: KNEE ARTHROSCOPY WITH LATERAL MENISECTOMY;  Surgeon: Johnny Bridge, MD;  Location: [facility];  Service: Orthopedics;  Laterality: Left;  LEFT KNEE SCOPE LATERAL MENISCECTOMY  . LEFT HEART CATH  AND CORONARY ANGIOGRAPHY N/A 03/15/2017   Procedure: Left Heart Cath and Coronary Angiography;  Surgeon: Wellington Hampshire, MD;  Location: [facility];  Service: Cardiovascular;  Laterality: N/A;  . MOUTH SURGERY    . skin cancer eyelid  2012  . TONSILLECTOMY       Current Outpatient Prescriptions  Medication Sig Dispense Refill  . acetaminophen (TYLENOL) 500 MG tablet Take 500 mg by mouth every 6 (six) hours as needed.    [name] Kitchen aspirin EC 81 MG tablet Take 81 mg by mouth daily.    [name] Kitchen atorvastatin (LIPITOR) 10 MG tablet TAKE 1 TABLET(10 MG) BY MOUTH DAILY AT 6 PM 90 tablet 2  . Calcium Carbonate-Vitamin D (CALCIUM 600+D PO) Take 1 tablet by mouth. TAKES 1200 T D 500    . clorazepate (TRANXENE) 7.5 MG tablet Take 1 tablet (7.5 mg total) by mouth daily as needed for anxiety. 30 tablet 0  . irbesartan (AVAPRO) 150 MG tablet TAKE 1 TABLET BY MOUTH EVERY DAY 90 tablet 1  . nebivolol (BYSTOLIC) 5 MG tablet Take 1 tablet (5 mg total) by mouth daily. 90 tablet 3  . omeprazole (PRILOSEC) 20 MG capsule Take 1 capsule (20 mg total) by mouth daily as needed (heartburn). 30 capsule 11   Current Facility-Administered Medications  Medication Dose Route Frequency Provider Last Rate Last Dose  . cyanocobalamin ((VITAMIN B-12)) injection 1,000 mcg  1,000 mcg Intramuscular Q30 days [name], [name], MD   1,000 mcg at 02/25/17 1422    Allergies:   Amlodipine besylate; Antihistamines, chlorpheniramine-type; Atorvastatin; Cefuroxime axetil; Ciprofloxacin; Co q 10 [coenzyme q10]; Crestor [rosuvastatin calcium]; Dextromethorphan-guaifenesin; Epinephrine; Ramipril; Sulfamethoxazole-trimethoprim; and Vitamin d analogs    Social History:  The patient  reports that she quit smoking about 53 years ago. Her smoking use included Cigarettes. She has a 12.00 pack-year smoking history. She has never used smokeless tobacco. She reports that she does not drink alcohol or use drugs.   Family History:  The patient's family  history includes Cancer in her mother and paternal aunt; Diabetes in her brother, sister, and sister; Heart disease in her father; Leukemia in her brother.    ROS:  Please see the history of present illness.   Otherwise, review of systems are positive for none.   All other systems are reviewed and negative.    PHYSICAL EXAM: VS:  BP 126/60 (BP Location: Left Arm, Patient Position: Sitting, Cuff Size: Normal)   Pulse 66   Ht 5' 4"  (1.626 m)   Wt 206 lb (93.4 kg)   BMI 35.36 kg/m  , BMI Body mass index is 35.36 kg/m. GEN: Well nourished, well developed, in no acute distress  HEENT: normal  Neck: no JVD, carotid bruits, or masses Cardiac: RRR; no  rubs, or gallops,no edema . 2/6 decrescendo diastolic murmur at the aortic area and left sternal border. Respiratory:  clear to auscultation bilaterally, normal work of breathing GI: soft, nontender, nondistended, + BS MS: no deformity or atrophy  Skin: warm and dry, no rash Neuro:  Strength and sensation are intact Psych: euthymic  mood, full affect Right radial pulse is normal with no hematoma.   EKG:  EKG is ordered today. The ekg ordered today demonstrates normal sinus rhythm with no significant ST or T wave changes. Minimal LVH.   Recent Labs: 03/12/2017: Hemoglobin 13.1; Platelets 291 03/18/2017: BUN 23; Creatinine, Ser 0.86; Potassium 3.9; Sodium 133    Lipid Panel    Component Value Date/Time   CHOL 203 (H) 03/05/2016 0932   TRIG 90.0 03/05/2016 0932   HDL 68.10 03/05/2016 0932   CHOLHDL 3 03/05/2016 0932   VLDL 18.0 03/05/2016 0932   LDLCALC 117 (H) 03/05/2016 0932   LDLDIRECT 105.9 02/08/2013 1050      Wt Readings from Last 3 Encounters:  03/30/17 206 lb (93.4 kg)  03/15/17 199 lb (90.3 kg)  03/12/17 208 lb (94.3 kg)       No flowsheet data found.    ASSESSMENT AND PLAN:  1. Recent angina: Cardiac catheterization showed no significant coronary artery disease and I suspect that her symptoms were likely due  to uncontrolled hypertension.  2. Aortic insufficiency: This was mild on most recent echocardiogram. She continues to have a diastolic aortic murmur. Recommend repeat echocardiogram in 2019.  3. Essential hypertension:   She has intolerance to multiple medications but is doing reasonably well on Bystolic. She does complain of fatigue and low energy but I explained to her that this should improve in the next 2-3 weeks.  4. Hyperlipidemia: Currently she is on atorvastatin 10 mg daily.    Disposition:   FU with me in 6 months  Signed,  Kathlyn Sacramento, MD  03/30/2017 2:56 PM    [place]

## 2017-04-08 ENCOUNTER — Ambulatory Visit: Payer: Medicare Other

## 2017-04-10 ENCOUNTER — Other Ambulatory Visit: Payer: Self-pay | Admitting: Family Medicine

## 2017-04-11 ENCOUNTER — Telehealth: Payer: Self-pay | Admitting: Family Medicine

## 2017-04-11 DIAGNOSIS — E7800 Pure hypercholesterolemia, unspecified: Secondary | ICD-10-CM

## 2017-04-11 DIAGNOSIS — I10 Essential (primary) hypertension: Secondary | ICD-10-CM

## 2017-04-11 DIAGNOSIS — E538 Deficiency of other specified B group vitamins: Secondary | ICD-10-CM

## 2017-04-11 NOTE — Telephone Encounter (Signed)
-----   Message from Ellamae Sia sent at 04/06/2017  3:08 PM EDT ----- Regarding: Lab orders for Friday, 5.25.18 Patient is scheduled for CPX labs, please order future labs, Thanks , Karna Christmas

## 2017-04-12 ENCOUNTER — Other Ambulatory Visit: Payer: Self-pay | Admitting: Family Medicine

## 2017-04-16 ENCOUNTER — Other Ambulatory Visit: Payer: Medicare Other

## 2017-04-16 ENCOUNTER — Ambulatory Visit (INDEPENDENT_AMBULATORY_CARE_PROVIDER_SITE_OTHER): Payer: Medicare Other

## 2017-04-16 VITALS — BP 130/70 | HR 59 | Temp 97.8°F | Ht 64.5 in | Wt 206.5 lb

## 2017-04-16 DIAGNOSIS — E7800 Pure hypercholesterolemia, unspecified: Secondary | ICD-10-CM | POA: Diagnosis not present

## 2017-04-16 DIAGNOSIS — Z0000 Encounter for general adult medical examination without abnormal findings: Secondary | ICD-10-CM

## 2017-04-16 DIAGNOSIS — I10 Essential (primary) hypertension: Secondary | ICD-10-CM

## 2017-04-16 DIAGNOSIS — E538 Deficiency of other specified B group vitamins: Secondary | ICD-10-CM

## 2017-04-16 LAB — LIPID PANEL
Cholesterol: 200 mg/dL (ref 0–200)
HDL: 63.2 mg/dL (ref >39.00)
LDL Cholesterol: 115 mg/dL — ABNORMAL HIGH (ref 0–99)
NonHDL: 136.34
Total CHOL/HDL Ratio: 3
Triglycerides: 108 mg/dL (ref 0.0–149.0)
VLDL: 21.6 mg/dL (ref 0.0–40.0)

## 2017-04-16 LAB — CBC WITH DIFFERENTIAL/PLATELET
Basophils Absolute: 0.1 10*3/uL (ref 0.0–0.1)
Basophils Relative: 1 % (ref 0.0–3.0)
Eosinophils Absolute: 0.2 10*3/uL (ref 0.0–0.7)
Eosinophils Relative: 3.9 % (ref 0.0–5.0)
HCT: 37.7 % (ref 36.0–46.0)
Hemoglobin: 12.6 g/dL (ref 12.0–15.0)
Lymphocytes Relative: 39.1 % (ref 12.0–46.0)
Lymphs Abs: 2.2 10*3/uL (ref 0.7–4.0)
MCHC: 33.5 g/dL (ref 30.0–36.0)
MCV: 90.4 fl (ref 78.0–100.0)
Monocytes Absolute: 0.5 10*3/uL (ref 0.1–1.0)
Monocytes Relative: 9.5 % (ref 3.0–12.0)
Neutro Abs: 2.6 10*3/uL (ref 1.4–7.7)
Neutrophils Relative %: 46.5 % (ref 43.0–77.0)
Platelets: 271 10*3/uL (ref 150.0–400.0)
RBC: 4.17 Mil/uL (ref 3.87–5.11)
RDW: 15.1 % (ref 11.5–15.5)
WBC: 5.6 10*3/uL (ref 4.0–10.5)

## 2017-04-16 LAB — COMPREHENSIVE METABOLIC PANEL
ALT: 10 U/L (ref 0–35)
AST: 13 U/L (ref 0–37)
Albumin: 4 g/dL (ref 3.5–5.2)
Alkaline Phosphatase: 55 U/L (ref 39–117)
BUN: 16 mg/dL (ref 6–23)
CO2: 29 mEq/L (ref 19–32)
Calcium: 9.5 mg/dL (ref 8.4–10.5)
Chloride: 107 mEq/L (ref 96–112)
Creatinine, Ser: 0.86 mg/dL (ref 0.40–1.20)
GFR: 67.47 mL/min (ref >60.00)
Glucose, Bld: 92 mg/dL (ref 70–99)
Potassium: 4.2 mEq/L (ref 3.5–5.1)
Sodium: 142 mEq/L (ref 135–145)
Total Bilirubin: 0.5 mg/dL (ref 0.2–1.2)
Total Protein: 6.4 g/dL (ref 6.0–8.3)

## 2017-04-16 LAB — VITAMIN B12: Vitamin B-12: 394 pg/mL (ref 211–911)

## 2017-04-16 LAB — TSH: TSH: 1.19 u[IU]/mL (ref 0.35–4.50)

## 2017-04-16 NOTE — Progress Notes (Signed)
Pre visit review using our clinic review tool, if applicable. No additional management support is needed unless otherwise documented below in the visit note. 

## 2017-04-16 NOTE — Progress Notes (Signed)
PCP notes:   Health maintenance:  No gaps identified.  Abnormal screenings:   None  Patient concerns:   None  Nurse concerns:  B12 injection administered as ordered. Pt tolerated injection well.   Next PCP appt:   04/20/17 @ 1030  I reviewed health advisor's note, was available for consultation, and agree with documentation and plan. Loura Pardon MD

## 2017-04-16 NOTE — Progress Notes (Signed)
Subjective:   Tammy Manning is a 82 y.o. female who presents for Medicare Annual (Subsequent) preventive examination.  Review of Systems: N/A Cardiac Risk Factors include: advanced age (>33mn, >>45women);obesity (BMI >30kg/m2);dyslipidemia;hypertension     Objective:     Vitals: BP 130/70 (BP Location: Right Arm, Patient Position: Sitting, Cuff Size: Normal)   Pulse (!) 59   Temp 97.8 F (36.6 C) (Oral)   Ht 5' 4.5" (1.638 m) Comment: no shoes  Wt 206 lb 8 oz (93.7 kg)   SpO2 98%   BMI 34.90 kg/m   Body mass index is 34.9 kg/m.   Tobacco History  Smoking Status  . Former Smoker  . Packs/day: 1.00  . Years: 12.00  . Types: Cigarettes  . Quit date: 11/24/1963  Smokeless Tobacco  . Never Used     Counseling given: No   Past Medical History:  Diagnosis Date  . Anginal pain ([place]   . Arthritis    Knees  . B12 deficiency    Mild  . Bucket-handle tear of lateral meniscus of left knee as current injury 12/30/2012  . Cancer (HCC)    Skin, basal cell on nose and eyelid  . DJD (degenerative joint disease)    Low back  . Dyspepsia   . GERD (gastroesophageal reflux disease)   . Heart murmur   . Heart palpitations   . HOH (hard of hearing)   . Hyperlipidemia   . Hypertension   . Lactose intolerance   . Mixed incontinence   . PONV (postoperative nausea and vomiting)   . Vulvar irritation    from urine pad, uses Triamcinolone oint.   Past Surgical History:  Procedure Laterality Date  . ABDOMINAL AORTOGRAM N/A 03/15/2017   Procedure: Abdominal Aortogram;  Surgeon: MWellington Hampshire MD;  [facility];  Service: Cardiovascular;  Laterality: N/A;  . ABDOMINAL HYSTERECTOMY  1982   Large fibroids and bleeding  . APPENDECTOMY    . BILATERAL SALPINGOOPHORECTOMY  1986  . BONE GRAFT HIP ILIAC CREST     To Left arm  . BREAST REDUCTION SURGERY    . BREAST SURGERY    . CATARACT EXTRACTION W/PHACO Left 07/07/2016   Procedure: CATARACT EXTRACTION PHACO  AND INTRAOCULAR LENS PLACEMENT (IOC);  Surgeon: WBirder Robson MD;  Location: [facility];  Service: Ophthalmology;  Laterality: Left;  [place]10 AP% 18.3 CDE 12.91 Fluid pak lot # [id number]  . CATARACT EXTRACTION W/PHACO Right 07/28/2016   Procedure: CATARACT EXTRACTION PHACO AND INTRAOCULAR LENS PLACEMENT ([place];  Surgeon: WBirder Robson MD;  Location: [facility];  Service: Ophthalmology;  Laterality: Right;  [place]:26 AP% 18.5 CDE 16.12 Fluid pack lot # [id number]  . COLONOSCOPY  07/2016   Dr. MThana Farr . DILATION AND CURETTAGE OF UTERUS     miscarragesx2  . ESOPHAGOGASTRODUODENOSCOPY     Polyps  . [place] 2006  . KNEE ARTHROSCOPY WITH LATERAL MENISECTOMY Left 12/30/2012   Procedure: KNEE ARTHROSCOPY WITH LATERAL MENISECTOMY;  Surgeon: JJohnny Bridge MD;  [place] [facility]  Service: Orthopedics;  Laterality: Left;  LEFT KNEE SCOPE LATERAL MENISCECTOMY  . LEFT HEART CATH AND CORONARY ANGIOGRAPHY N/A 03/15/2017   Procedure: Left Heart Cath and Coronary Angiography;  Surgeon: MWellington Hampshire MD;  [facility];  Service: Cardiovascular;  Laterality: N/A;  . MOUTH SURGERY    . skin cancer eyelid  2012  . TONSILLECTOMY     Family History  Problem Relation Age  of Onset  . Cancer Mother        Colon  . Heart disease Father        CAD  . Diabetes Sister   . Diabetes Brother   . Leukemia Brother   . Cancer Paternal Aunt        Breast  . Diabetes Sister    History  Sexual Activity  . Sexual activity: Yes    Outpatient Encounter Prescriptions as of 04/16/2017  Medication Sig  . acetaminophen (TYLENOL) 500 MG tablet Take 500 mg by mouth every 6 (six) hours as needed.  [name] Kitchen aspirin EC 81 MG tablet Take 81 mg by mouth daily.  [name] Kitchen atorvastatin (LIPITOR) 10 MG tablet TAKE 1 TABLET(10 MG) BY MOUTH DAILY AT 6 PM  . Calcium Carbonate-Vitamin D (CALCIUM 600+D PO) Take 1 tablet by mouth. TAKES 1200 T D 500  . clorazepate (TRANXENE) 7.5 MG tablet Take 1  tablet (7.5 mg total) by mouth daily as needed for anxiety.  . irbesartan (AVAPRO) 150 MG tablet TAKE 1 TABLET BY MOUTH EVERY DAY  . nebivolol (BYSTOLIC) 5 MG tablet Take 1 tablet (5 mg total) by mouth daily.  [name] Kitchen omeprazole (PRILOSEC) 20 MG capsule TAKE 1 CAPSULE(20 MG) BY MOUTH DAILY AS NEEDED FOR HEARTBURN   Facility-Administered Encounter Medications as of 04/16/2017  Medication  . cyanocobalamin ((VITAMIN B-12)) injection 1,000 mcg    Activities of Daily Living In your present state of health, do you have any difficulty performing the following activities: 04/16/2017  Hearing? Y  Vision? N  Difficulty concentrating or making decisions? N  Walking or climbing stairs? N  Dressing or bathing? N  Doing errands, shopping? N  Preparing Food and eating ? N  Using the Toilet? N  In the past six months, have you accidently leaked urine? N  Do you have problems with loss of bowel control? N  Managing your Medications? N  Managing your Finances? N  Housekeeping or managing your Housekeeping? N  Some recent data might be hidden    Patient Care Team: [name], [name], MD as PCP - General Susa Day, MD (Orthopedic Surgery) Richmond Campbell, MD (Gastroenterology) Wellington Hampshire, MD as Consulting Physician (Cardiology) Birder Robson, MD as Referring Physician (Ophthalmology)    Assessment:    Hearing Screening Comments: Bilateral hearing aids Vision Screening Comments: Last vision exam in March 2018 with Dr. George Ina  Exercise Activities and Dietary recommendations Current Exercise Habits: Home exercise routine, Type of exercise: walking, Time (Minutes): 60, Frequency (Times/Week): 7, Weekly Exercise (Minutes/Week): 420, Exercise limited by: None identified  Goals    . Increase physical activity          Starting 04/16/2017, I will continue to walk at least 2 miles daily.    [name] Kitchen LDL CALC < 130      Fall Risk Fall Risk  04/16/2017 10/21/2016 09/15/2016 04/14/2016 03/06/2015    Falls in the past year? No No No No No  Risk for fall due to : - - Other (Comment) - -   Depression Screen PHQ 2/9 Scores 04/16/2017 10/21/2016 09/15/2016 04/14/2016  PHQ - 2 Score 0 0 0 0  Exception Documentation - - Other- indicate reason in comment box -     Cognitive Function MMSE - Mini Mental State Exam 04/16/2017 04/14/2016  Orientation to time 5 5  Orientation to Place 5 5  Registration 3 3  Attention/ Calculation 0 0  Recall 3 3  Language- name 2 objects 0 0  Language-  repeat 1 1  Language- follow 3 step command 3 3  Language- read & follow direction 0 0  Write a sentence 0 0  Copy design 0 0  Total score 20 20       PLEASE NOTE: A Mini-Cog screen was completed. Maximum score is 20. A value of 0 denotes this part of [place] MMSE was not completed or the patient failed this part of the Mini-Cog screening.   Mini-Cog Screening Orientation to Time - Max 5 pts Orientation to Place - Max 5 pts Registration - Max 3 pts Recall - Max 3 pts Language Repeat - Max 1 pts Language Follow 3 Step Command - Max 3 pts   Immunization History  Administered Date(s) Administered  . Influenza Split 07/24/2011, 08/23/2014  . Influenza Whole 08/23/2004, 07/24/2009, 08/06/2010, 08/05/2012  . Influenza, High Dose Seasonal PF 08/11/2016  . Influenza,inj,Quad PF,36+ Mos 08/16/2013, 08/07/2015  . Pneumococcal Conjugate-13 08/24/2014  . Pneumococcal Polysaccharide-23 08/20/2008  . Td 03/20/2003, 02/28/2013  . Zoster 09/04/2015   Screening Tests Health Maintenance  Topic Date Due  . INFLUENZA VACCINE  06/23/2017  . MAMMOGRAM  07/20/2017  . TETANUS/TDAP  03/01/2023  . DEXA SCAN  Completed  . PNA vac Low Risk Adult  Completed      Plan:     I have personally reviewed and addressed the Medicare Annual Wellness questionnaire and have noted the following in the patient's chart:  A. Medical and social history B. Use of alcohol, tobacco or illicit drugs  C. Current medications and  supplements D. Functional ability and status E.  Nutritional status F.  Physical activity G. Advance directives H. List of other physicians I.  Hospitalizations, surgeries, and ER visits in previous 12 months J.  Continental to include hearing, vision, cognitive, depression L. Referrals and appointments - none  In addition, I have reviewed and discussed with patient certain preventive protocols, quality metrics, and best practice recommendations. A written personalized care plan for preventive services as well as general preventive health recommendations were provided to patient.  See attached scanned questionnaire for additional information.   Signed,   Lindell Noe, MHA, BS, LPN Health Coach

## 2017-04-16 NOTE — Patient Instructions (Signed)
Tammy Manning , Thank you for taking time to come for your Medicare Wellness Visit. I appreciate your ongoing commitment to your health goals. Please review the following plan we discussed and let me know if I can assist you in the future.   These are the goals we discussed: Goals    . Increase physical activity          Starting 04/16/2017, I will continue to walk at least 2 miles daily.    [name] Kitchen LDL CALC < 130       This is a list of the screening recommended for you and due dates:  Health Maintenance  Topic Date Due  . Flu Shot  06/23/2017  . Mammogram  07/20/2017  . Tetanus Vaccine  03/01/2023  . DEXA scan (bone density measurement)  Completed  . Pneumonia vaccines  Completed   Preventive Care for Adults  A healthy lifestyle and preventive care can promote health and wellness. Preventive health guidelines for adults include the following key practices.  . A routine yearly physical is a good way to check with your health care provider about your health and preventive screening. It is a chance to share any concerns and updates on your health and to receive a thorough exam.  . Visit your dentist for a routine exam and preventive care every 6 months. Brush your teeth twice a day and floss once a day. Good oral hygiene prevents tooth decay and gum disease.  . The frequency of eye exams is based on your age, health, family medical history, use  of contact lenses, and other factors. Follow your health care provider's ecommendations for frequency of eye exams.  . Eat a healthy diet. Foods like vegetables, fruits, whole grains, low-fat dairy products, and lean protein foods contain the nutrients you need without too many calories. Decrease your intake of foods high in solid fats, added sugars, and salt. Eat the right amount of calories for you. Get information about a proper diet from your health care provider, if necessary.  . Regular physical exercise is one of the most important things you  can do for your health. Most adults should get at least 150 minutes of moderate-intensity exercise (any activity that increases your heart rate and causes you to sweat) each week. In addition, most adults need muscle-strengthening exercises on 2 or more days a week.  Silver Sneakers may be a benefit available to you. To determine eligibility, you may visit the website: www.silversneakers.com or contact program at 920-426-9954 Mon-Fri between 8AM-8PM.   . Maintain a healthy weight. The body mass index (BMI) is a screening tool to identify possible weight problems. It provides an estimate of body fat based on height and weight. Your health care provider can find your BMI and can help you achieve or maintain a healthy weight.   For adults 20 years and older: ? A BMI below 18.5 is considered underweight. ? A BMI of 18.5 to 24.9 is normal. ? A BMI of 25 to 29.9 is considered overweight. ? A BMI of 30 and above is considered obese.   . Maintain normal blood lipids and cholesterol levels by exercising and minimizing your intake of saturated fat. Eat a balanced diet with plenty of fruit and vegetables. Blood tests for lipids and cholesterol should begin at age 22 and be repeated every 5 years. If your lipid or cholesterol levels are high, you are over 50, or you are at high risk for heart disease, you may need your  cholesterol levels checked more frequently. Ongoing high lipid and cholesterol levels should be treated with medicines if diet and exercise are not working.  . If you smoke, find out from your health care provider how to quit. If you do not use tobacco, please do not start.  . If you choose to drink alcohol, please do not consume more than 2 drinks per day. One drink is considered to be 12 ounces (355 mL) of beer, 5 ounces (148 mL) of wine, or 1.5 ounces (44 mL) of liquor.  . If you are 48-56 years old, ask your health care provider if you should take aspirin to prevent strokes.  . Use  sunscreen. Apply sunscreen liberally and repeatedly throughout the day. You should seek shade when your shadow is shorter than you. Protect yourself by wearing long sleeves, pants, a wide-brimmed hat, and sunglasses year round, whenever you are outdoors.  . Once a month, do a whole body skin exam, using a mirror to look at the skin on your back. Tell your health care provider of new moles, moles that have irregular borders, moles that are larger than a pencil eraser, or moles that have changed in shape or color.

## 2017-04-19 ENCOUNTER — Other Ambulatory Visit: Payer: Self-pay | Admitting: Family Medicine

## 2017-04-20 ENCOUNTER — Ambulatory Visit (INDEPENDENT_AMBULATORY_CARE_PROVIDER_SITE_OTHER): Payer: Medicare Other | Admitting: Family Medicine

## 2017-04-20 ENCOUNTER — Encounter: Payer: Self-pay | Admitting: Family Medicine

## 2017-04-20 VITALS — BP 132/72 | HR 72 | Temp 98.2°F | Ht 64.0 in | Wt 207.2 lb

## 2017-04-20 DIAGNOSIS — I480 Paroxysmal atrial fibrillation: Secondary | ICD-10-CM

## 2017-04-20 DIAGNOSIS — E538 Deficiency of other specified B group vitamins: Secondary | ICD-10-CM | POA: Diagnosis not present

## 2017-04-20 DIAGNOSIS — E7800 Pure hypercholesterolemia, unspecified: Secondary | ICD-10-CM | POA: Diagnosis not present

## 2017-04-20 DIAGNOSIS — I10 Essential (primary) hypertension: Secondary | ICD-10-CM | POA: Diagnosis not present

## 2017-04-20 DIAGNOSIS — I209 Angina pectoris, unspecified: Secondary | ICD-10-CM | POA: Diagnosis not present

## 2017-04-20 MED ORDER — IRBESARTAN 150 MG PO TABS: 150.0000 mg | ORAL_TABLET | Freq: Every day | ORAL | 3 refills | Status: DC

## 2017-04-20 MED ORDER — OMEPRAZOLE 20 MG PO CPDR: DELAYED_RELEASE_CAPSULE | ORAL | 3 refills | Status: DC

## 2017-04-20 MED ORDER — ATORVASTATIN CALCIUM 10 MG PO TABS: ORAL_TABLET | ORAL | 3 refills | Status: DC

## 2017-04-20 NOTE — Progress Notes (Signed)
Subjective:    Patient ID: Tammy Manning, female    DOB: 1935-07-21, 82 y.o.   MRN: [id number]  HPI Here for annual f/u of chronic health problems   Has been feeling well   amw was 5/25 No gaps or concerns re : care   Wt Readings from Last 3 Encounters:  04/20/17 207 lb 4 oz (94 kg)  04/16/17 206 lb 8 oz (93.7 kg)  03/30/17 206 lb (93.4 kg)  stable  Eating healthy  Walks 2-4 Manning per day (fit bit for her birthday)  bmi 35.5  Mammogram 8/17= nl - she will schedule it herself in aug Self breast exam -no lumps   dexa 11/12-normal bmd  No falls or fractures  Takes her ca and D   zostavax 10/16  ifob neg 3/13 Colonoscopy 8/17 = adenoma - will re check in 3-5 y depending on how she is doing    cardiac status  Doing well overall  She had some chest pressure / Dr Fletcher Anon is seeing her - and her cath was ok  That was good news   bp is stable today (it was labile)- bystolic helped  No cp or palpitations or headaches or edema  No side effects to medicines  BP Readings from Last 3 Encounters:  04/20/17 132/72  04/16/17 130/70  03/30/17 126/60      Hx of B12 def - gets inj monthly  Lab Results  Component Value Date   VITAMINB12 394 04/16/2017    Hx of hyperlipidemia  Takes atorvastatin 10 Lab Results  Component Value Date   CHOL 200 04/16/2017   CHOL 203 (H) 03/05/2016   CHOL 211 (H) 02/27/2015   Lab Results  Component Value Date   HDL 63.20 04/16/2017   HDL 68.10 03/05/2016   HDL 70.30 02/27/2015   Lab Results  Component Value Date   LDLCALC 115 (H) 04/16/2017   LDLCALC 117 (H) 03/05/2016   LDLCALC 124 (H) 02/27/2015   Lab Results  Component Value Date   TRIG 108.0 04/16/2017   TRIG 90.0 03/05/2016   TRIG 83.0 02/27/2015   Lab Results  Component Value Date   CHOLHDL 3 04/16/2017   CHOLHDL 3 03/05/2016   CHOLHDL 3 02/27/2015   Lab Results  Component Value Date   LDLDIRECT 105.9 02/08/2013   LDLDIRECT 125.0 09/12/2012   LDLDIRECT 119.3  12/31/2011   Does not tolerate higher statin dose  Just had a good cath     Chemistry      Component Value Date/Time   NA 142 04/16/2017 0944   NA 142 11/15/2012 0158   K 4.2 04/16/2017 0944   K 4.2 11/15/2012 0158   CL 107 04/16/2017 0944   CL 112 (H) 11/15/2012 0158   CO2 29 04/16/2017 0944   CO2 25 11/15/2012 0158   BUN 16 04/16/2017 0944   BUN 19 (H) 11/15/2012 0158   CREATININE 0.86 04/16/2017 0944   CREATININE 0.72 11/15/2012 0158      Component Value Date/Time   CALCIUM 9.5 04/16/2017 0944   CALCIUM 8.7 11/15/2012 0158   ALKPHOS 55 04/16/2017 0944   ALKPHOS 62 11/15/2012 0158   AST 13 04/16/2017 0944   AST 21 11/15/2012 0158   ALT 10 04/16/2017 0944   ALT 16 11/15/2012 0158   BILITOT 0.5 04/16/2017 0944   BILITOT 0.3 11/15/2012 0158     glucose of 92 Lab Results  Component Value Date   WBC 5.6 04/16/2017   HGB 12.6 04/16/2017  HCT 37.7 04/16/2017   MCV 90.4 04/16/2017   PLT 271.0 04/16/2017    Lab Results  Component Value Date   TSH 1.19 04/16/2017    Patient Active Problem List   Diagnosis Date Noted  . Angina, class II ([place]) 09/10/2015  . Aortic insufficiency 09/10/2015  . Stress reaction 09/08/2015  . Encounter for Medicare annual wellness exam 02/28/2013  . Bucket-handle tear of lateral meniscus of left knee as current injury 12/30/2012  . Episodic atrial fibrillation ([place]) 11/17/2012  . Special screening for malignant neoplasms, colon 01/21/2012  . Hearing loss of both ears 01/14/2012  . Adverse drug reaction 03/12/2011  . HEADACHE, CHRONIC 09/24/2010  . B12 deficiency 03/17/2010  . DYSPEPSIA 03/17/2010  . POSTMENOPAUSAL STATUS 08/20/2008  . Essential hypertension 07/19/2007  . Hyperlipidemia 06/24/2007  . CONSTIPATION 06/24/2007  . IBS 06/24/2007  . DERMATITIS, ATOPIC 06/24/2007  . SKIN CANCER, HX OF 06/24/2007   Past Medical History:  Diagnosis Date  . Anginal pain ([place])   . Arthritis    Knees  . B12 deficiency    Mild  .  Bucket-handle tear of lateral meniscus of left knee as current injury 12/30/2012  . Cancer ([facility])    Skin, basal cell on nose and eyelid  . DJD (degenerative joint disease)    Low back  . Dyspepsia   . GERD (gastroesophageal reflux disease)   . Heart murmur   . Heart palpitations   . HOH (hard of hearing)   . Hyperlipidemia   . Hypertension   . Lactose intolerance   . Mixed incontinence   . PONV (postoperative nausea and vomiting)   . Vulvar irritation    from urine pad, uses Triamcinolone oint.   Past Surgical History:  Procedure Laterality Date  . ABDOMINAL AORTOGRAM N/A 03/15/2017   Procedure: Abdominal Aortogram;  Surgeon: Wellington Hampshire, MD;  Location: [facility];  Service: Cardiovascular;  Laterality: N/A;  . ABDOMINAL HYSTERECTOMY  1982   Large fibroids and bleeding  . APPENDECTOMY    . BILATERAL SALPINGOOPHORECTOMY  1986  . BONE GRAFT HIP ILIAC CREST     To Left arm  . BREAST REDUCTION SURGERY    . BREAST SURGERY    . CATARACT EXTRACTION W/PHACO Left 07/07/2016   Procedure: CATARACT EXTRACTION PHACO AND INTRAOCULAR LENS PLACEMENT (IOC);  Surgeon: Birder Robson, MD;  Location: [facility];  Service: Ophthalmology;  Laterality: Left;  [place] 01:10 AP% 18.3 CDE 12.91 Fluid pak lot # [id number]  . CATARACT EXTRACTION W/PHACO Right 07/28/2016   Procedure: CATARACT EXTRACTION PHACO AND INTRAOCULAR LENS PLACEMENT ([place]);  Surgeon: Birder Robson, MD;  Location: [facility];  Service: Ophthalmology;  Laterality: Right;  [place] 01:26 AP% 18.5 CDE 16.12 Fluid pack lot # [id number]  . COLONOSCOPY  07/2016   Dr. Thana Farr  . DILATION AND CURETTAGE OF UTERUS     miscarragesx2  . ESOPHAGOGASTRODUODENOSCOPY     Polyps  . [place]  2006  . KNEE ARTHROSCOPY WITH LATERAL MENISECTOMY Left 12/30/2012   Procedure: KNEE ARTHROSCOPY WITH LATERAL MENISECTOMY;  Surgeon: Johnny Bridge, MD;  Location: [facility];  Service: Orthopedics;  Laterality: Left;  LEFT KNEE SCOPE  LATERAL MENISCECTOMY  . LEFT HEART CATH AND CORONARY ANGIOGRAPHY N/A 03/15/2017   Procedure: Left Heart Cath and Coronary Angiography;  Surgeon: Wellington Hampshire, MD;  Location: [facility];  Service: Cardiovascular;  Laterality: N/A;  . MOUTH SURGERY    . skin cancer eyelid  2012  .  TONSILLECTOMY     Social History  Substance Use Topics  . Smoking status: Former Smoker    Packs/day: 1.00    Years: 12.00    Types: Cigarettes    Quit date: 11/24/1963  . Smokeless tobacco: Never Used  . Alcohol use No     Comment: Rare   Family History  Problem Relation Age of Onset  . Cancer Mother        Colon  . Heart disease Father        CAD  . Diabetes Sister   . Diabetes Brother   . Leukemia Brother   . Cancer Paternal Aunt        Breast  . Diabetes Sister    Allergies  Allergen Reactions  . Amlodipine Besylate Other (See Comments)    Reaction:  Increases BP  . Antihistamines, Chlorpheniramine-Type Other (See Comments)    Reaction:  Makes pt hyper   . Atorvastatin Other (See Comments)    Reaction:  Myalgias   . Cefuroxime Axetil Other (See Comments)    Upset stomach  . Ciprofloxacin Other (See Comments)    Upset stomach  . Co Q 10 [Coenzyme Q10] Other (See Comments)    Reaction:  GI upset   . Crestor [Rosuvastatin Calcium] Other (See Comments)    Reaction:  Myalgias   . Dextromethorphan-Guaifenesin Other (See Comments)    Reaction:  Made pt jittery   . Epinephrine Hives  . Ramipril Other (See Comments)    Upset stomach  . Sulfamethoxazole-Trimethoprim Other (See Comments)    Upset stomach  . Vitamin D Analogs Other (See Comments)    Reaction:  GI upset    Current Outpatient Prescriptions on File Prior to Visit  Medication Sig Dispense Refill  . acetaminophen (TYLENOL) 500 MG tablet Take 500 mg by mouth every 6 (six) hours as needed.    [name] Kitchen aspirin EC 81 MG tablet Take 81 mg by mouth daily.    . Calcium Carbonate-Vitamin D (CALCIUM 600+D PO) Take 1 tablet by  mouth. TAKES 1200 T D 500    . clorazepate (TRANXENE) 7.5 MG tablet Take 1 tablet (7.5 mg total) by mouth daily as needed for anxiety. 30 tablet 0  . nebivolol (BYSTOLIC) 5 MG tablet Take 1 tablet (5 mg total) by mouth daily. 90 tablet 3   Current Facility-Administered Medications on File Prior to Visit  Medication Dose Route Frequency Provider Last Rate Last Dose  . cyanocobalamin ((VITAMIN B-12)) injection 1,000 mcg  1,000 mcg Intramuscular Q30 days [name], [name], MD   1,000 mcg at 04/16/17 2353    Review of Systems Review of Systems  Constitutional: Negative for fever, appetite change, fatigue and unexpected weight change.  Eyes: Negative for pain and visual disturbance.  Respiratory: Negative for cough and shortness of breath.   Cardiovascular: Negative for cp or palpitations    Gastrointestinal: Negative for nausea, diarrhea and constipation.  Genitourinary: Negative for urgency and frequency.  Skin: Negative for pallor or rash   Neurological: Negative for weakness, light-headedness, numbness and headaches.  Hematological: Negative for adenopathy. Does not bruise/bleed easily.  Psychiatric/Behavioral: Negative for dysphoric mood. The patient is not nervous/anxious.         Objective:   Physical Exam  Constitutional: She appears well-developed and well-nourished. No distress.  obese and well appearing   HENT:  Head: Normocephalic and atraumatic.  Right Ear: External ear normal.  Left Ear: External ear normal.  Mouth/Throat: Oropharynx is clear and moist.  Eyes: Conjunctivae  and EOM are normal. Pupils are equal, round, and reactive to light. No scleral icterus.  Neck: Normal range of motion. Neck supple. No JVD present. Carotid bruit is not present. No thyromegaly present.  Cardiovascular: Normal rate, regular rhythm and intact distal pulses.  Exam reveals no gallop.   Murmur heard. Pulmonary/Chest: Effort normal and breath sounds normal. No respiratory distress. She has no  wheezes. She exhibits no tenderness.  Abdominal: Soft. Bowel sounds are normal. She exhibits no distension, no abdominal bruit and no mass. There is no tenderness.  Genitourinary: No breast swelling, tenderness, discharge or bleeding.  Genitourinary Comments: Breast exam: No mass, nodules, thickening, tenderness, bulging, retraction, inflamation, nipple discharge or skin changes noted.  No axillary or clavicular LA.      Musculoskeletal: Normal range of motion. She exhibits no edema or tenderness.  Lymphadenopathy:    She has no cervical adenopathy.  Neurological: She is alert. She has normal reflexes. No cranial nerve deficit. She exhibits normal muscle tone. Coordination normal.  Skin: Skin is warm and dry. No rash noted. No erythema. No pallor.  Solar lentigines diffusely   Psychiatric: She has a normal mood and affect.  Pleasant and mentally sharp          Assessment & Plan:   Problem List Items Addressed This Visit      Cardiovascular and Mediastinum   Angina, class II ([place])    No symptoms  Had re assuring cath recently      Relevant Medications   atorvastatin (LIPITOR) 10 MG tablet   irbesartan (AVAPRO) 150 MG tablet   Episodic atrial fibrillation ([place]) - Primary    No re occurrences recently      Relevant Medications   atorvastatin (LIPITOR) 10 MG tablet   irbesartan (AVAPRO) 150 MG tablet   Essential hypertension    bp in fair control at this time  BP Readings from Last 1 Encounters:  04/20/17 132/72   No changes needed Disc lifstyle change with low sodium diet and exercise  Labs reviewed       Relevant Medications   atorvastatin (LIPITOR) 10 MG tablet   irbesartan (AVAPRO) 150 MG tablet     Other   B12 deficiency    Stable with monthly injections  Lab Results  Component Value Date   VITAMINB12 394 04/16/2017         Hyperlipidemia    Disc goals for lipids and reasons to control them Rev labs with pt Rev low sat fat diet in detail  Fairly  well controlled LDL and HDL  Not at goal however of LDL under 70 She is at the top dose of statin she can tolerate (atorvastatin 10) Will continue to watch her diet       Relevant Medications   atorvastatin (LIPITOR) 10 MG tablet   irbesartan (AVAPRO) 150 MG tablet

## 2017-04-20 NOTE — Progress Notes (Signed)
Pre visit review using our clinic review tool, if applicable. No additional management support is needed unless otherwise documented below in the visit note. 

## 2017-04-20 NOTE — Patient Instructions (Addendum)
Don't forget to get your mammogram in august   Keep walking and taking care of yourself  Labs are stable   Get your flu shot in the fall

## 2017-04-22 NOTE — Assessment & Plan Note (Signed)
bp in fair control at this time  BP Readings from Last 1 Encounters:  04/20/17 132/72   No changes needed Disc lifstyle change with low sodium diet and exercise  Labs reviewed

## 2017-04-22 NOTE — Assessment & Plan Note (Signed)
Disc goals for lipids and reasons to control them Rev labs with pt Rev low sat fat diet in detail  Fairly well controlled LDL and HDL  Not at goal however of LDL under 70 She is at the top dose of statin she can tolerate (atorvastatin 10) Will continue to watch her diet

## 2017-04-22 NOTE — Assessment & Plan Note (Signed)
No re occurrences recently

## 2017-04-22 NOTE — Assessment & Plan Note (Signed)
Stable with monthly injections  Lab Results  Component Value Date   [id number] 04/16/2017

## 2017-04-22 NOTE — Assessment & Plan Note (Signed)
No symptoms  Had re assuring cath recently

## 2017-05-13 ENCOUNTER — Telehealth: Payer: Self-pay | Admitting: Cardiovascular Disease

## 2017-05-13 ENCOUNTER — Other Ambulatory Visit: Payer: Self-pay | Admitting: Family Medicine

## 2017-05-13 MED ORDER — IRBESARTAN 300 MG PO TABS
300.0000 mg | ORAL_TABLET | Freq: Every day | ORAL | 3 refills | Status: DC
Start: 2017-05-13 — End: 2018-05-12

## 2017-05-13 NOTE — Telephone Encounter (Signed)
Px written for call in   

## 2017-05-13 NOTE — Telephone Encounter (Signed)
Last OV was 04/20/17 and she had her next CPE scheduled for next year, last filled on 12/14/16 #30 tabs with 0 refills, please advise

## 2017-05-13 NOTE — Telephone Encounter (Signed)
Rx called in to requested pharmacy 

## 2017-05-13 NOTE — Telephone Encounter (Signed)
Please increase avapro to 300 mg daily.  Cont to follow bp's @ home as she is.  F/u nurse bp check next week.

## 2017-05-13 NOTE — Telephone Encounter (Signed)
S/w patient. She verbalized understanding to increase Avapro to 300 mg daily, continue to monitor BP and f/u at nurse visit next week on 05/19/17 at 8:30 am.  Patient stated she has taken her BP this afternoon and it was 197/68 and she feels a small headache. Advised patient to go ahead and take Avapro 150 mg now and to monitor BP. If it does not decrease and she continues to have symptoms to proceed to ED for evaluation. Patient verbalize understanding.  Routing to Ignacia Bayley, NP

## 2017-05-13 NOTE — Telephone Encounter (Signed)
Pt calling stating we recently placed her on additional BP medication The past few days she wasn't feeling so good and so she started to monitor her BP She felt her BP in her chest, she is aware she doesn't have Heart Problems but keeps an eye on this Felt like a "gurging in her chest"  She walks a lot and that's when she feels it she states    And the past few days it has been going higher and higher 170-190 over 60-70  05/13/17: 161/62 05/12/17  173/67 05/11/17: 179/70  05/10/17: 168/58 05/09/17: 186/72

## 2017-05-13 NOTE — Telephone Encounter (Signed)
S/w patient. She reports increased BP over the past 5 days. She felt like it was up when she her chest began to "feel like a gurgling (fluttering) or a pressure."  This sensation occurs occasionally and usually when up walking around.  So she decided to take her BP. Heart rate is running in the 60's. BP's were taken about 1 hour after taking meds (Avapro 150 mg daily and Bystolic 5 mg daily.) 01/22/03: 161/62 05/12/17  173/67 05/11/17: 179/70  05/10/17: 168/58 05/09/17: 186/72 She reports one day it was as high as 197/69, HR 59.  Denies H/A, blurred vision, dizziness or swelling. She's been on Chlorthalidone in the past and did not tolerate this. Advised to continue to monitor blood pressure and heart rate and I will make a provider aware for further recommendations. Will route to Ignacia Bayley, NP for review as Dr Fletcher Anon is out of the office this week.

## 2017-05-19 ENCOUNTER — Telehealth: Payer: Self-pay | Admitting: Cardiovascular Disease

## 2017-05-19 ENCOUNTER — Other Ambulatory Visit: Payer: Self-pay

## 2017-05-19 ENCOUNTER — Ambulatory Visit (INDEPENDENT_AMBULATORY_CARE_PROVIDER_SITE_OTHER): Payer: Medicare Other

## 2017-05-19 VITALS — BP 170/78 | HR 64 | Resp 16

## 2017-05-19 DIAGNOSIS — I10 Essential (primary) hypertension: Secondary | ICD-10-CM | POA: Diagnosis not present

## 2017-05-19 MED ORDER — HYDRALAZINE HCL 25 MG PO TABS: 25.0000 mg | ORAL_TABLET | Freq: Three times a day (TID) | ORAL | 3 refills | Status: DC

## 2017-05-19 NOTE — Telephone Encounter (Signed)
Pt is calling with her BP reading : 194/70, she states "it is going not up"

## 2017-05-19 NOTE — Patient Instructions (Addendum)
1.) Reason for visit: BP check  2.) Name of MD requesting visit: Ignacia Bayley, NP  3.) H&P: Pt has history of hypertension. Can not tolerate atenolol, amlodipine, or chlorthalidone. Pt called the office last week c/o elevated SBP 160-180s accompanied by a "fluttering" in her chest. Avapro increased to 357m BID and she was continue all other meds as prescribed, including bystolic 517m   4.) ROS related to problem: Pt c/o slight headache today and continued "fluttering" in her chest that she states happens only when BP is elevated. She has been taking Avapro 30057mID since June 21 as instructed but did not realize she was to continue bystolic as well.  Last dose was June 20.  She thinks bystolic increased her BP as she had the same reaction years ago when she took atenolol.   Pt walks 2 miles every day outside. Advised her to refrain from this until BP is controlled. She verbalized understanding and will await further instructions from MD.   5.) Assessment and plan per MD: BP today 170/78, HR 64.  Will route to Dr. EndSaunders Revelho is in the office this afternoon for further review and advice.     Reviewed with Dr. EndSaunders Revelo advised pt to restart bystolic 5mg4mce daily, continue to monitor BP daily and report if BP >160/100, HR <55 or she experiences dizziness or other symptoms.   Reviewed Dr. End'Darnelle Bosommendations w/pt who is agreeable with plan. She will restart bystolic today and call with BP readings.

## 2017-05-19 NOTE — Telephone Encounter (Signed)
Confirmed recent BP reading with patient. Reports SBP 184 1 hour and 20 minutes after taking bystolic.  She has been resting since. Recheck SBP 194. She has taken Tylenol for headache. Reviewed w/Ryan Idolina Primer, PA-C who recommends pt take another bystolic for a total of 43QI today.  S/w pt who states she is scared to take another bystolic as she thinks this is what is driving her blood pressure up. Discussed again with Thurmond Butts who prescribed hydralazine 58m TID. Pt agreeable and requests prescription be sent to [facility]. She understands to take one dose now and one before bedtime She will continue to monitor and is aware that she can s/w after hours provider if she has concerns overnight. Pt will call tomorrow with an update. She verbalized understanding and is appreciative of the call.

## 2017-05-20 NOTE — Telephone Encounter (Signed)
Pt calling to give her BP readings she took 7 am this morning   140/60  Please take a look and call back if we have any concerns

## 2017-05-20 NOTE — Telephone Encounter (Signed)
S/w pt who confirms BP this morning of 140/60 after taking avapro 150m and hydralazine 234mBP before medications: 149/63 States she feels much better and understands to continue to monitor BP and report if sx return.  Offered sooner f/u appt w/Dr. ArFletcher Anonut she states she will monitor VS and call if she would like to be seen sooner thatn 6 month follow up

## 2017-05-29 ENCOUNTER — Encounter: Payer: Self-pay | Admitting: Family Medicine

## 2017-06-09 ENCOUNTER — Ambulatory Visit (INDEPENDENT_AMBULATORY_CARE_PROVIDER_SITE_OTHER): Payer: Medicare Other | Admitting: *Deleted

## 2017-06-09 DIAGNOSIS — E538 Deficiency of other specified B group vitamins: Secondary | ICD-10-CM

## 2017-07-21 ENCOUNTER — Ambulatory Visit (INDEPENDENT_AMBULATORY_CARE_PROVIDER_SITE_OTHER): Payer: Medicare Other

## 2017-07-21 DIAGNOSIS — E538 Deficiency of other specified B group vitamins: Secondary | ICD-10-CM | POA: Diagnosis not present

## 2017-07-21 MED ORDER — CYANOCOBALAMIN 1000 MCG/ML IJ SOLN
1000.0000 ug | Freq: Once | INTRAMUSCULAR | Status: AC
  Administered 2017-07-21: 1000 ug via INTRAMUSCULAR

## 2017-08-02 DIAGNOSIS — Z1231 Encounter for screening mammogram for malignant neoplasm of breast: Secondary | ICD-10-CM | POA: Diagnosis not present

## 2017-08-04 ENCOUNTER — Encounter: Payer: Self-pay | Admitting: Family Medicine

## 2017-08-27 DIAGNOSIS — Z23 Encounter for immunization: Secondary | ICD-10-CM | POA: Diagnosis not present

## 2017-09-01 ENCOUNTER — Ambulatory Visit (INDEPENDENT_AMBULATORY_CARE_PROVIDER_SITE_OTHER): Payer: Medicare Other

## 2017-09-01 DIAGNOSIS — E538 Deficiency of other specified B group vitamins: Secondary | ICD-10-CM

## 2017-09-01 MED ORDER — CYANOCOBALAMIN 1000 MCG/ML IJ SOLN
1000.0000 ug | Freq: Once | INTRAMUSCULAR | Status: AC
  Administered 2017-09-01: 1000 ug via INTRAMUSCULAR

## 2017-09-09 ENCOUNTER — Other Ambulatory Visit: Payer: Self-pay | Admitting: Family Medicine

## 2017-09-10 NOTE — Telephone Encounter (Signed)
Px written for call in   

## 2017-09-10 NOTE — Telephone Encounter (Signed)
Last refill 05/13/17  Last OV 04/20/17 Ok to refill?

## 2017-09-10 NOTE — Telephone Encounter (Signed)
callled in [place], [place]: [phone]

## 2017-10-13 ENCOUNTER — Ambulatory Visit: Payer: Medicare Other

## 2017-10-26 ENCOUNTER — Ambulatory Visit (INDEPENDENT_AMBULATORY_CARE_PROVIDER_SITE_OTHER): Payer: Medicare Other | Admitting: *Deleted

## 2017-10-26 DIAGNOSIS — E538 Deficiency of other specified B group vitamins: Secondary | ICD-10-CM | POA: Diagnosis not present

## 2017-10-26 MED ORDER — CYANOCOBALAMIN 1000 MCG/ML IJ SOLN
1000.0000 ug | Freq: Once | INTRAMUSCULAR | Status: AC
  Administered 2017-10-26: 1000 ug via INTRAMUSCULAR

## 2017-10-28 NOTE — Progress Notes (Signed)
I reviewed health advisor's note, was available for consultation, and agree with documentation and plan. Loura Pardon MD

## 2017-12-04 IMAGING — CR DG KNEE COMPLETE 4+V*R*
1 series · 5 of 5 positions shown · non-contrast
Comparison: None.

CLINICAL DATA: 81-year-old female with left knee pain increasing
over several days after injury walking dog. Initial encounter.

EXAM:
RIGHT KNEE - COMPLETE 4+ VIEW

[Series 1: t knee ap right · 0.14mm/px · 5 of 5 slices shown]
[im 1/5]
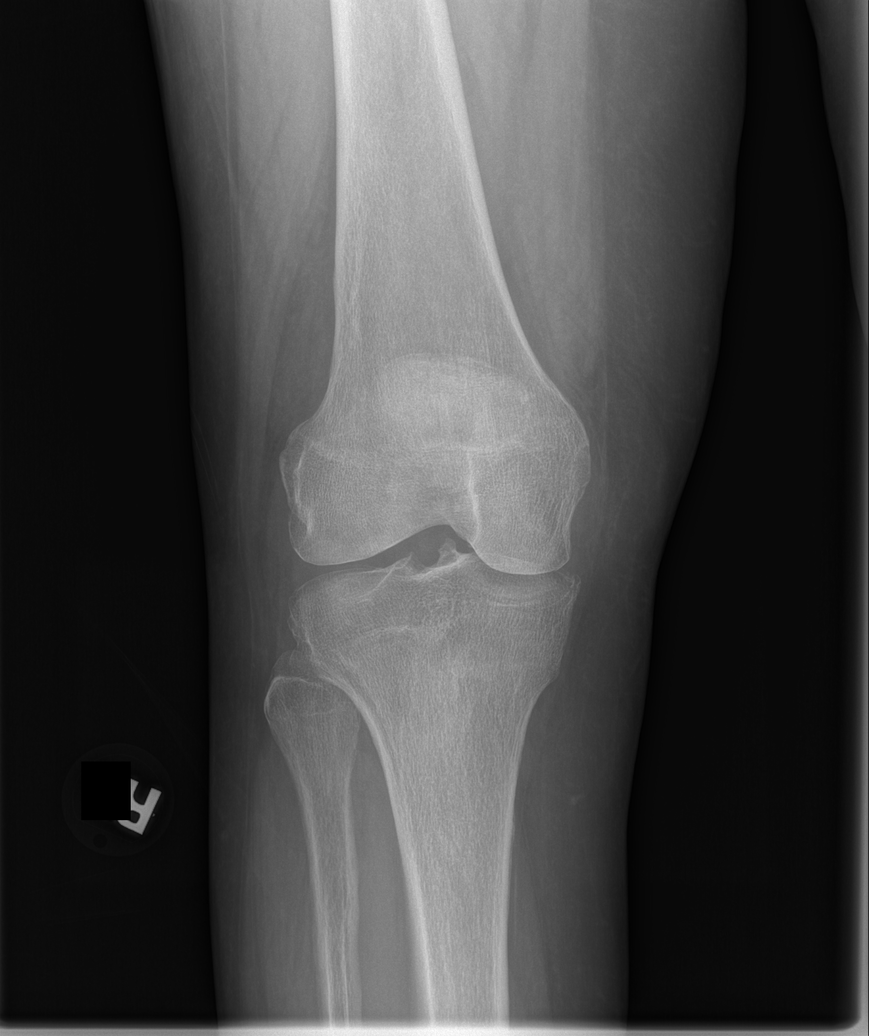
[im 2/5]
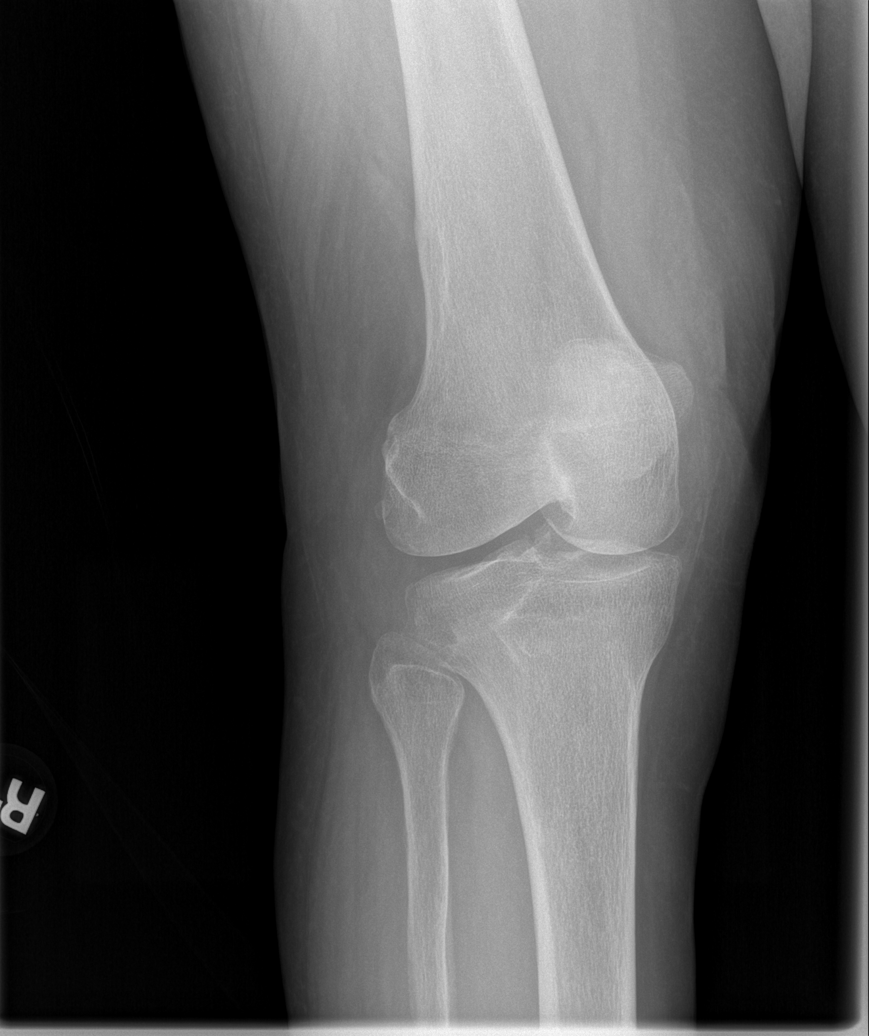
[im 3/5]
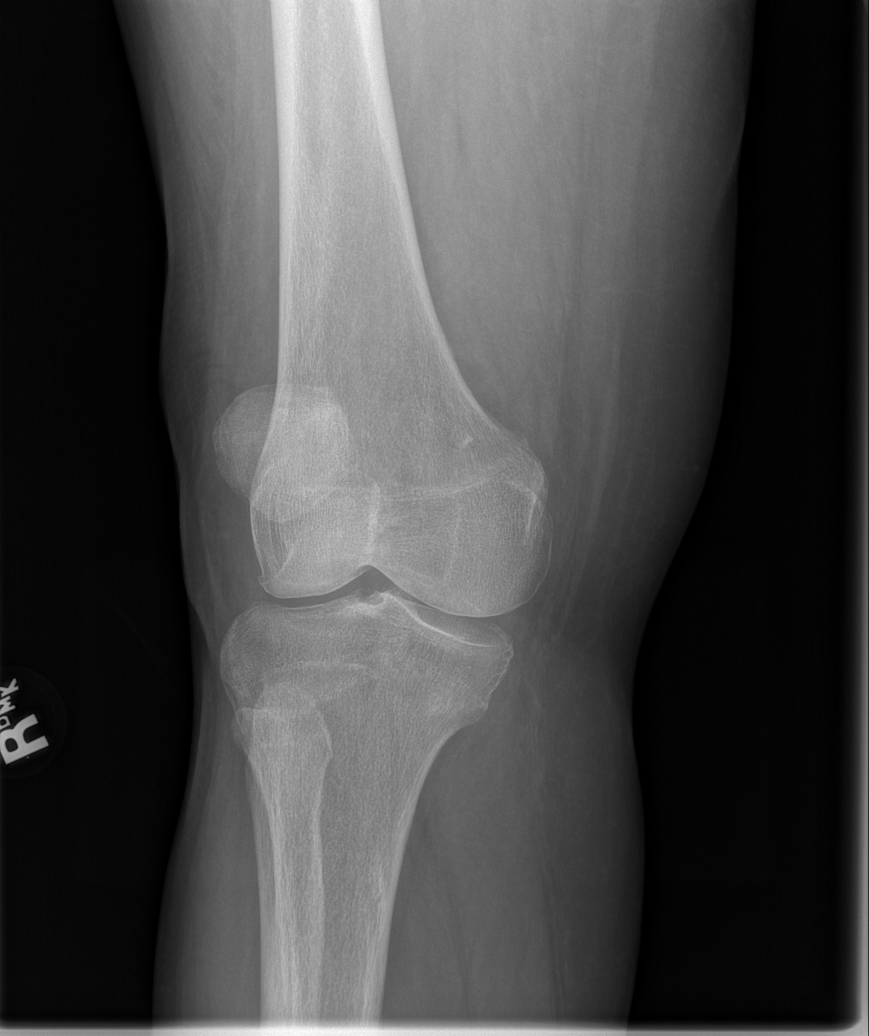
[im 4/5]
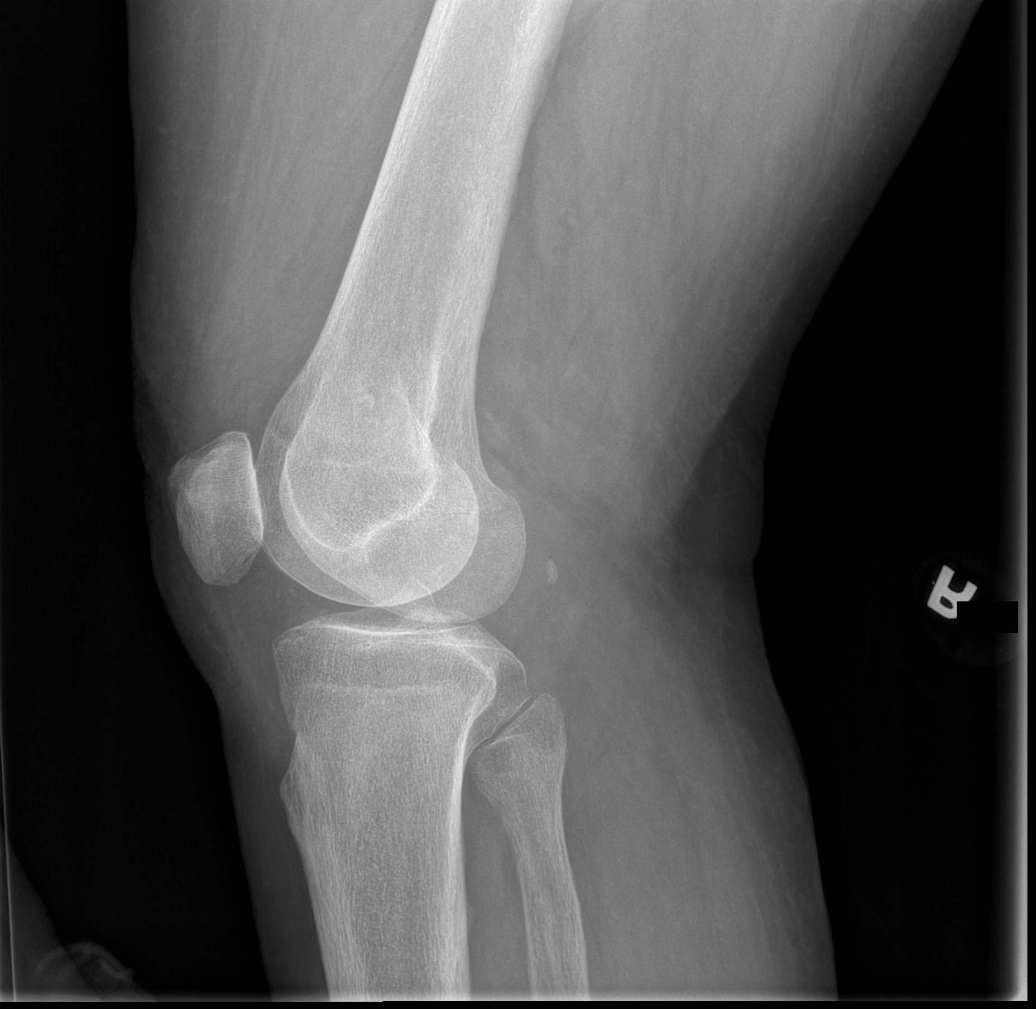
[im 5/5]
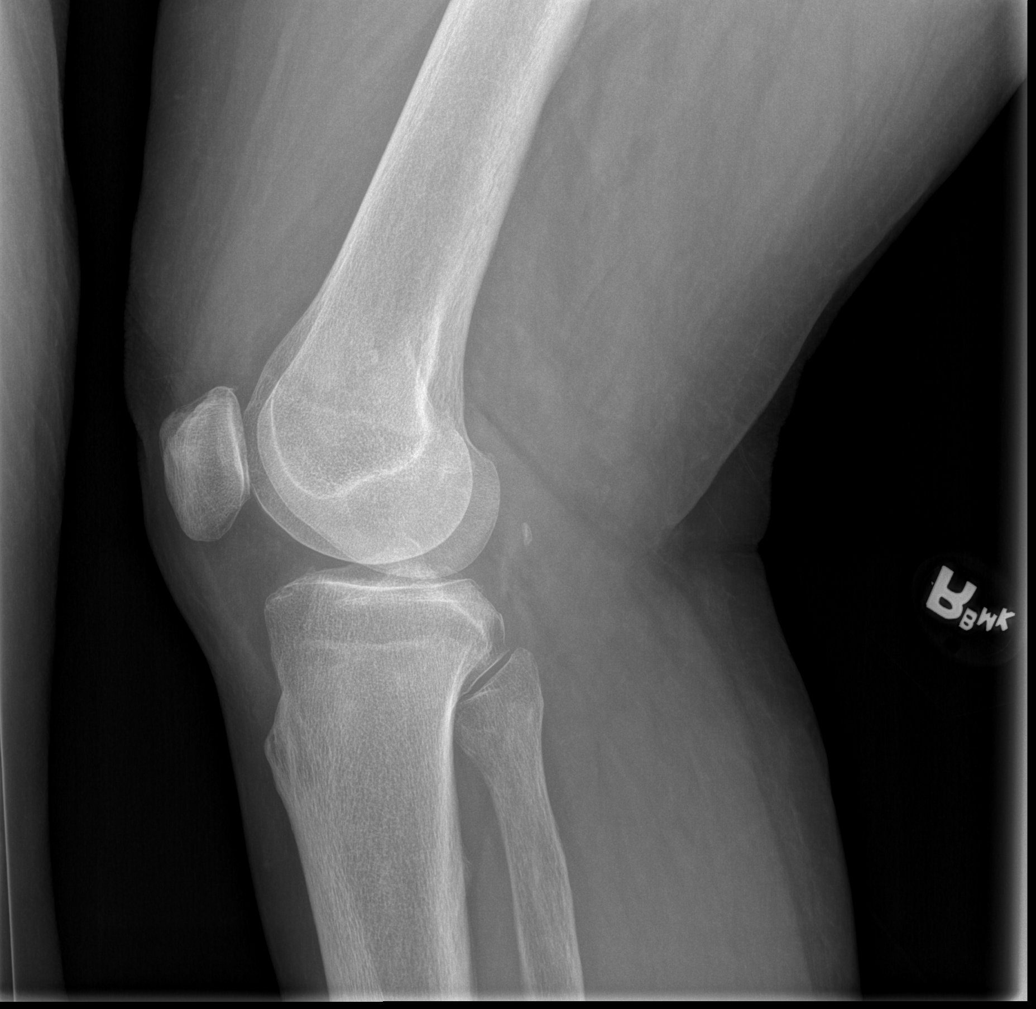

[5 of 5 positions shown; findings below may reference images not displayed]

FINDINGS: No fracture or dislocation.

Question tiny joint effusion.
IMPRESSION: No fracture or dislocation.

## 2017-12-14 ENCOUNTER — Ambulatory Visit (INDEPENDENT_AMBULATORY_CARE_PROVIDER_SITE_OTHER): Payer: Medicare Other

## 2017-12-14 DIAGNOSIS — E538 Deficiency of other specified B group vitamins: Secondary | ICD-10-CM | POA: Diagnosis not present

## 2017-12-14 MED ORDER — CYANOCOBALAMIN 1000 MCG/ML IJ SOLN
1000.0000 ug | Freq: Once | INTRAMUSCULAR | Status: AC
  Administered 2017-12-14: 1000 ug via INTRAMUSCULAR

## 2018-01-25 ENCOUNTER — Ambulatory Visit (INDEPENDENT_AMBULATORY_CARE_PROVIDER_SITE_OTHER): Payer: Medicare Other

## 2018-01-25 DIAGNOSIS — E538 Deficiency of other specified B group vitamins: Secondary | ICD-10-CM

## 2018-01-25 MED ORDER — CYANOCOBALAMIN 1000 MCG/ML IJ SOLN
1000.0000 ug | Freq: Once | INTRAMUSCULAR | Status: AC
  Administered 2018-01-25: 1000 ug via INTRAMUSCULAR

## 2018-02-01 ENCOUNTER — Encounter: Payer: Self-pay | Admitting: Cardiovascular Disease

## 2018-02-01 ENCOUNTER — Ambulatory Visit (INDEPENDENT_AMBULATORY_CARE_PROVIDER_SITE_OTHER): Payer: Medicare Other | Admitting: Cardiovascular Disease

## 2018-02-01 VITALS — BP 130/62 | HR 75 | Ht 64.5 in | Wt 198.5 lb

## 2018-02-01 DIAGNOSIS — I10 Essential (primary) hypertension: Secondary | ICD-10-CM

## 2018-02-01 DIAGNOSIS — E7800 Pure hypercholesterolemia, unspecified: Secondary | ICD-10-CM | POA: Diagnosis not present

## 2018-02-01 DIAGNOSIS — I359 Nonrheumatic aortic valve disorder, unspecified: Secondary | ICD-10-CM

## 2018-02-01 NOTE — Progress Notes (Signed)
Cardiology Office Note   Date:  02/01/2018   ID:  Tammy Manning, DOB [birth year], MRN [id number]  PCP:  Abner Greenspan, MD  Cardiologist:   Kathlyn Sacramento, MD   Chief Complaint  Patient presents with  . other    6 month follow up. Meds reviewed by the pt. verbally. Pt. c/o feeling washed out when taking the second dose of chlorthalidone tablet.       History of Present Illness: Tammy Manning is a 83 y.o. female who presents for a follow-up visit regarding essential hypertension and mild aortic insufficiency.  Echocardiogram in November 2016 showed normal LV systolic function, grade 1 diastolic dysfunction, mild aortic regurgitation, mild mitral regurgitation and normal pulmonary pressure. She has intolerance to multiple antihypertensive medications.  Cardiac catheterization in April 2018 showed normal coronary arteries and normal LV systolic function.  Abdominal aortogram showed no evidence of renal artery stenosis.   We tried different blood pressure medications as she has poor tolerance.  Bystolic made her very fatigued.  She did not tolerate regular dose of chlorthalidone due to cramps but she has been tolerating a small dose 12.5 mg every other day.  Overall, she has been doing well with no recent chest pain, shortness of breath or palpitations.  Past Medical History:  Diagnosis Date  . Anginal pain ([place])   . Arthritis    Knees  . B12 deficiency    Mild  . Bucket-handle tear of lateral meniscus of left knee as current injury 12/30/2012  . Cancer ([facility])    Skin, basal cell on nose and eyelid  . DJD (degenerative joint disease)    Low back  . Dyspepsia   . GERD (gastroesophageal reflux disease)   . Heart murmur   . Heart palpitations   . HOH (hard of hearing)   . Hyperlipidemia   . Hypertension   . Lactose intolerance   . Mixed incontinence   . PONV (postoperative nausea and vomiting)   . Vulvar irritation    from urine pad, uses Triamcinolone oint.    Past  Surgical History:  Procedure Laterality Date  . ABDOMINAL AORTOGRAM N/A 03/15/2017   Procedure: Abdominal Aortogram;  Surgeon: Wellington Hampshire, MD;  Location: [facility];  Service: Cardiovascular;  Laterality: N/A;  . ABDOMINAL HYSTERECTOMY  1982   Large fibroids and bleeding  . APPENDECTOMY    . BILATERAL SALPINGOOPHORECTOMY  1986  . BONE GRAFT HIP ILIAC CREST     To Left arm  . BREAST REDUCTION SURGERY    . BREAST SURGERY    . CATARACT EXTRACTION W/PHACO Left 07/07/2016   Procedure: CATARACT EXTRACTION PHACO AND INTRAOCULAR LENS PLACEMENT ([facility]);  Surgeon: Birder Robson, MD;  Location: [facility];  Service: Ophthalmology;  Laterality: Left;  [place] 01:10 AP% 18.3 CDE 12.91 Fluid pak lot # [id number]  . CATARACT EXTRACTION W/PHACO Right 07/28/2016   Procedure: CATARACT EXTRACTION PHACO AND INTRAOCULAR LENS PLACEMENT ([place]);  Surgeon: Birder Robson, MD;  Location: [facility];  Service: Ophthalmology;  Laterality: Right;  [place] 01:26 AP% 18.5 CDE 16.12 Fluid pack lot # [id number]  . COLONOSCOPY  07/2016   Dr. Thana Farr  . DILATION AND CURETTAGE OF UTERUS     miscarragesx2  . ESOPHAGOGASTRODUODENOSCOPY     Polyps  . [place]  2006  . KNEE ARTHROSCOPY WITH LATERAL MENISECTOMY Left 12/30/2012   Procedure: KNEE ARTHROSCOPY WITH LATERAL MENISECTOMY;  Surgeon: Johnny Bridge, MD;  Location: [facility];  Service: Orthopedics;  Laterality: Left;  LEFT KNEE SCOPE LATERAL MENISCECTOMY  . LEFT HEART CATH AND CORONARY ANGIOGRAPHY N/A 03/15/2017   Procedure: Left Heart Cath and Coronary Angiography;  Surgeon: Wellington Hampshire, MD;  Location: [facility];  Service: Cardiovascular;  Laterality: N/A;  . MOUTH SURGERY    . skin cancer eyelid  2012  . TONSILLECTOMY       Current Outpatient Medications  Medication Sig Dispense Refill  . acetaminophen (TYLENOL) 500 MG tablet Take 500 mg by mouth every 6 (six) hours as needed.    [name] Kitchen aspirin EC 81 MG tablet Take 81 mg by  mouth daily.    [name] Kitchen atorvastatin (LIPITOR) 10 MG tablet TAKE 1 TABLET(10 MG) BY MOUTH DAILY AT 6 PM 90 tablet 3  . Calcium Carbonate-Vitamin D (CALCIUM 600+D PO) Take 1 tablet by mouth. TAKES 1200 T D 500    . chlorthalidone (HYGROTON) 25 MG tablet Take 1/2 tablet every two days.    . clorazepate (TRANXENE) 7.5 MG tablet TAKE 1 TABLET BY MOUTH EVERY DAY AS NEEDED 30 tablet 0  . irbesartan (AVAPRO) 300 MG tablet Take 1 tablet (300 mg total) by mouth daily. 90 tablet 3  . omeprazole (PRILOSEC) 20 MG capsule TAKE 1 CAPSULE(20 MG) BY MOUTH DAILY AS NEEDED FOR HEARTBURN 90 capsule 3   Current Facility-Administered Medications  Medication Dose Route Frequency Provider Last Rate Last Dose  . cyanocobalamin ((VITAMIN B-12)) injection 1,000 mcg  1,000 mcg Intramuscular Q30 days [name], [name], MD   1,000 mcg at 06/09/17 1104    Allergies:   Amlodipine besylate; Antihistamines, chlorpheniramine-type; Atenolol; Atorvastatin; Cefuroxime axetil; Chlorthalidone; Ciprofloxacin; Co q 10 [coenzyme q10]; Crestor [rosuvastatin calcium]; Dextromethorphan-guaifenesin; Epinephrine; Ramipril; Sulfamethoxazole-trimethoprim; and Vitamin d analogs    Social History:  The patient  reports that she quit smoking about 54 years ago. Her smoking use included cigarettes. She has a 12.00 pack-year smoking history. she has never used smokeless tobacco. She reports that she does not drink alcohol or use drugs.   Family History:  The patient's family history includes Cancer in her mother and paternal aunt; Diabetes in her brother, sister, and sister; Heart disease in her father; Leukemia in her brother.    ROS:  Please see the history of present illness.   Otherwise, review of systems are positive for none.   All other systems are reviewed and negative.    PHYSICAL EXAM: VS:  BP 130/62 (BP Location: Left Arm, Patient Position: Sitting, Cuff Size: Normal)   Pulse 75   Ht 5' 4.5" (1.638 m)   Wt 198 lb 8 oz (90 kg)   BMI 33.55  kg/m  , BMI Body mass index is 33.55 kg/m. GEN: Well nourished, well developed, in no acute distress  HEENT: normal  Neck: no JVD, carotid bruits, or masses Cardiac: RRR; no  rubs, or gallops,no edema . 2/6 decrescendo diastolic murmur at the aortic area and left sternal border. Respiratory:  clear to auscultation bilaterally, normal work of breathing GI: soft, nontender, nondistended, + BS MS: no deformity or atrophy  Skin: warm and dry, no rash Neuro:  Strength and sensation are intact Psych: euthymic mood, full affect   EKG:  EKG is ordered today. The ekg ordered today demonstrates normal sinus rhythm with no significant ST or T wave changes. Minimal LVH.   Recent Labs: 04/16/2017: ALT 10; BUN 16; Creatinine, Ser 0.86; Hemoglobin 12.6; Platelets 271.0; Potassium 4.2; Sodium 142; TSH 1.19    Lipid Panel    Component  Value Date/Time   CHOL 200 04/16/2017 0944   TRIG 108.0 04/16/2017 0944   HDL 63.20 04/16/2017 0944   CHOLHDL 3 04/16/2017 0944   VLDL 21.6 04/16/2017 0944   LDLCALC 115 (H) 04/16/2017 0944   LDLDIRECT 105.9 02/08/2013 1050      Wt Readings from Last 3 Encounters:  02/01/18 198 lb 8 oz (90 kg)  04/20/17 207 lb 4 oz (94 kg)  04/16/17 206 lb 8 oz (93.7 kg)       No flowsheet data found.    ASSESSMENT AND PLAN:   1. Aortic insufficiency: This was mild on most recent echocardiogram in 2016. She continues to have a diastolic aortic murmur.  I requested a follow-up echocardiogram.  2. Essential hypertension:   She has intolerance to multiple medications but is doing reasonably well small dose of chlorthalidone and ARB.  3. Hyperlipidemia: Currently she is on atorvastatin 10 mg daily.    Disposition:   FU with me in 12 months  Signed,  Kathlyn Sacramento, MD  02/01/2018 2:30 PM    [place]

## 2018-02-01 NOTE — Patient Instructions (Addendum)
Medication Instructions:  Your physician recommends that you continue on your current medications as directed. Please refer to the Current Medication list given to you today.   Labwork: none  Testing/Procedures: Your physician has requested that you have an echocardiogram. Echocardiography is a painless test that uses sound waves to create images of your heart. It provides your doctor with information about the size and shape of your heart and how well your heart's chambers and valves are working. This procedure takes approximately one hour. There are no restrictions for this procedure.    Follow-Up: Your physician wants you to follow-up in: 1 year with Dr. Fletcher Anon.  You will receive a reminder letter in the mail two months in advance. If you don't receive a letter, please call our office to schedule the follow-up appointment.   Any Other Special Instructions Will Be Listed Below (If Applicable).     If you need a refill on your cardiac medications before your next appointment, please call your pharmacy.  Echocardiogram An echocardiogram, or echocardiography, uses sound waves (ultrasound) to produce an image of your heart. The echocardiogram is simple, painless, obtained within a short period of time, and offers valuable information to your health care provider. The images from an echocardiogram can provide information such as:  Evidence of coronary artery disease (CAD).  Heart size.  Heart muscle function.  Heart valve function.  Aneurysm detection.  Evidence of a past heart attack.  Fluid buildup around the heart.  Heart muscle thickening.  Assess heart valve function.  Tell a health care provider about:  Any allergies you have.  All medicines you are taking, including vitamins, herbs, eye drops, creams, and over-the-counter medicines.  Any problems you or family members have had with anesthetic medicines.  Any blood disorders you have.  Any surgeries you have  had.  Any medical conditions you have.  Whether you are pregnant or may be pregnant. What happens before the procedure? No special preparation is needed. Eat and drink normally. What happens during the procedure?  In order to produce an image of your heart, gel will be applied to your chest and a wand-like tool (transducer) will be moved over your chest. The gel will help transmit the sound waves from the transducer. The sound waves will harmlessly bounce off your heart to allow the heart images to be captured in real-time motion. These images will then be recorded.  You may need an IV to receive a medicine that improves the quality of the pictures. What happens after the procedure? You may return to your normal schedule including diet, activities, and medicines, unless your health care provider tells you otherwise. This information is not intended to replace advice given to you by your health care provider. Make sure you discuss any questions you have with your health care provider. Document Released: 11/06/2000 Document Revised: 06/27/2016 Document Reviewed: 07/17/2013 Elsevier Interactive Patient Education  2017 [employer].

## 2018-02-02 DIAGNOSIS — K648 Other hemorrhoids: Secondary | ICD-10-CM | POA: Diagnosis not present

## 2018-02-16 ENCOUNTER — Ambulatory Visit (INDEPENDENT_AMBULATORY_CARE_PROVIDER_SITE_OTHER): Payer: Medicare Other | Admitting: Family Medicine

## 2018-02-16 ENCOUNTER — Encounter: Payer: Self-pay | Admitting: Family Medicine

## 2018-02-16 VITALS — BP 130/64 | HR 76 | Temp 97.7°F | Wt 200.5 lb

## 2018-02-16 DIAGNOSIS — R1011 Right upper quadrant pain: Secondary | ICD-10-CM

## 2018-02-16 LAB — POC URINALSYSI DIPSTICK (AUTOMATED)
Bilirubin, UA: NEGATIVE
Blood, UA: NEGATIVE
Glucose, UA: NEGATIVE
Ketones, UA: NEGATIVE
Protein, UA: NEGATIVE
Spec Grav, UA: 1.015 (ref 1.010–1.025)
Urobilinogen, UA: 0.2 E.U./dL
pH, UA: 6 (ref 5.0–8.0)

## 2018-02-16 NOTE — Assessment & Plan Note (Signed)
Discussed with patient about anatomy and differential diagnosis.  Check urinalysis, would check urine culture given urinalysis results.  Discussed with patient.  Check routine labs, CBC, C met, lipase.  Rationale discussed with patient.  Right lower quadrant not tender.  She is some better already.  No sign of acute abdomen.  Okay for outpatient follow-up.  If labs are significantly abnormal we can address those.  If labs are unremarkable and she continues to improve then no other intervention needed.  If she clinically declined she will let us know.

## 2018-02-16 NOTE — Patient Instructions (Signed)
Go to the lab on the way out.  We'll contact you with your lab report. If you continue to have discomfort, then let us know.  Avoid fatty foods in the meantime.  Take care.  Glad to see you.

## 2018-02-16 NOTE — Progress Notes (Signed)
R sided flank/RUQ pain.  Some better today.  Started about 8 days ago.  She went to the chiropractor but she was told it wasn't a MSK issue.  No L sided sx.  No fevers, no chills.  No vomiting.   She has some occ nausea/dec in appetite recently.  She has some occ diarrhea at baseline.  No clear trigger or trauma.  Nothing seemed to make it better.  No CP, not SOB.  No BLE edema.  Still on baseline meds except for stopping aspirin a few days ago due to some BRBPR attributed to wiping vigorously.  She already had GI f/u about that w/o sig pathology found per patient report.  She had been taking aleve prior, when she had some bleeding.  She thought the BRBPR was a separate issue from the flank pain.  No h/o renal stones.  No blood in urine seen by patient.     Meds, vitals, and allergies reviewed.   ROS: Per HPI unless specifically indicated in ROS section   GEN: nad, alert and oriented HEENT: mucous membranes moist NECK: supple w/o LA CV: rrr.  no murmur PULM: ctab, no inc wob ABD: soft, +bs, RUQ minimally ttp but abdomen not tender to palpation otherwise.  EXT: no edema SKIN: no acute rash

## 2018-02-17 LAB — COMPREHENSIVE METABOLIC PANEL
ALT: 12 U/L (ref 0–35)
AST: 15 U/L (ref 0–37)
Albumin: 4 g/dL (ref 3.5–5.2)
Alkaline Phosphatase: 55 U/L (ref 39–117)
BUN: 22 mg/dL (ref 6–23)
CO2: 30 mEq/L (ref 19–32)
Calcium: 9.8 mg/dL (ref 8.4–10.5)
Chloride: 104 mEq/L (ref 96–112)
Creatinine, Ser: 0.83 mg/dL (ref 0.40–1.20)
GFR: 70.14 mL/min (ref >60.00)
Glucose, Bld: 102 mg/dL — ABNORMAL HIGH (ref 70–99)
Potassium: 4.8 mEq/L (ref 3.5–5.1)
Sodium: 141 mEq/L (ref 135–145)
Total Bilirubin: 0.3 mg/dL (ref 0.2–1.2)
Total Protein: 6.9 g/dL (ref 6.0–8.3)

## 2018-02-17 LAB — CBC WITH DIFFERENTIAL/PLATELET
Basophils Absolute: 0 10*3/uL (ref 0.0–0.1)
Basophils Relative: 0.5 % (ref 0.0–3.0)
Eosinophils Absolute: 0.2 10*3/uL (ref 0.0–0.7)
Eosinophils Relative: 2.9 % (ref 0.0–5.0)
HCT: 38.8 % (ref 36.0–46.0)
Hemoglobin: 12.9 g/dL (ref 12.0–15.0)
Lymphocytes Relative: 37.9 % (ref 12.0–46.0)
Lymphs Abs: 2.6 10*3/uL (ref 0.7–4.0)
MCHC: 33.3 g/dL (ref 30.0–36.0)
MCV: 93.9 fl (ref 78.0–100.0)
Monocytes Absolute: 0.7 10*3/uL (ref 0.1–1.0)
Monocytes Relative: 10.5 % (ref 3.0–12.0)
Neutro Abs: 3.3 10*3/uL (ref 1.4–7.7)
Neutrophils Relative %: 48.2 % (ref 43.0–77.0)
Platelets: 315 10*3/uL (ref 150.0–400.0)
RBC: 4.13 Mil/uL (ref 3.87–5.11)
RDW: 14.3 % (ref 11.5–15.5)
WBC: 6.8 10*3/uL (ref 4.0–10.5)

## 2018-02-17 LAB — LIPASE: Lipase: 13 U/L (ref 11.0–59.0)

## 2018-02-18 ENCOUNTER — Other Ambulatory Visit: Payer: Self-pay

## 2018-02-18 ENCOUNTER — Ambulatory Visit (INDEPENDENT_AMBULATORY_CARE_PROVIDER_SITE_OTHER): Payer: Medicare Other

## 2018-02-18 ENCOUNTER — Telehealth: Payer: Self-pay | Admitting: Family Medicine

## 2018-02-18 DIAGNOSIS — I359 Nonrheumatic aortic valve disorder, unspecified: Secondary | ICD-10-CM | POA: Diagnosis not present

## 2018-02-18 LAB — URINE CULTURE
MICRO NUMBER:: 90382627
SPECIMEN QUALITY:: ADEQUATE

## 2018-02-18 MED ORDER — NITROFURANTOIN MONOHYD MACRO 100 MG PO CAPS: 100.0000 mg | ORAL_CAPSULE | Freq: Two times a day (BID) | ORAL | 0 refills | Status: DC

## 2018-02-18 NOTE — Telephone Encounter (Signed)
Patient advised.

## 2018-02-18 NOTE — Telephone Encounter (Signed)
Glad the pain is better.   Would start NITROFURANTOIN.  rx sent.   F/u if not better.  Thanks.

## 2018-02-18 NOTE — Addendum Note (Signed)
Addended by: Tonia Ghent on: 02/18/2018 03:51 PM   Modules accepted: Orders

## 2018-02-18 NOTE — Telephone Encounter (Signed)
Called in and was given urine results that Dr. Damita Dunnings ordered.   I let her know her sensitivities were not ready yet.  Labs are fine except for unrine culture with gram-negative bacilli isolated.  I am awaiting the sensitivities on this.   Please get update about her abdominal pain.   She said the pain in her abd and under her ribs was better.  Pharmacy is [facility] in [place], [place]

## 2018-03-08 ENCOUNTER — Ambulatory Visit: Payer: Medicare Other | Admitting: Family Medicine

## 2018-03-09 ENCOUNTER — Ambulatory Visit: Payer: Medicare Other

## 2018-03-14 ENCOUNTER — Other Ambulatory Visit: Payer: Self-pay | Admitting: Family Medicine

## 2018-03-14 MED ORDER — CLORAZEPATE DIPOTASSIUM 7.5 MG PO TABS: 7.5000 mg | ORAL_TABLET | Freq: Every day | ORAL | 0 refills | Status: DC | PRN

## 2018-03-14 NOTE — Telephone Encounter (Signed)
CPE scheduled on 04/22/2018 (AWV scheduled too), last filled on 09/10/17 #30 tabs with 0 refills

## 2018-04-06 ENCOUNTER — Ambulatory Visit: Payer: Medicare Other

## 2018-04-07 ENCOUNTER — Ambulatory Visit (INDEPENDENT_AMBULATORY_CARE_PROVIDER_SITE_OTHER): Payer: Medicare Other

## 2018-04-07 DIAGNOSIS — E538 Deficiency of other specified B group vitamins: Secondary | ICD-10-CM

## 2018-04-07 MED ORDER — CYANOCOBALAMIN 1000 MCG/ML IJ SOLN
1000.0000 ug | Freq: Once | INTRAMUSCULAR | Status: AC
  Administered 2018-04-07: 1000 ug via INTRAMUSCULAR

## 2018-04-20 ENCOUNTER — Telehealth: Payer: Self-pay | Admitting: Family Medicine

## 2018-04-20 DIAGNOSIS — E7800 Pure hypercholesterolemia, unspecified: Secondary | ICD-10-CM

## 2018-04-20 DIAGNOSIS — I10 Essential (primary) hypertension: Secondary | ICD-10-CM

## 2018-04-20 DIAGNOSIS — E538 Deficiency of other specified B group vitamins: Secondary | ICD-10-CM

## 2018-04-20 NOTE — Telephone Encounter (Signed)
-----   Message from Eustace Pen, LPN sent at [phone]/72/6203  1:34 PM EDT ----- Regarding: Labs 5/30 Lab orders needed. Thank you.  Insurance: [employer]

## 2018-04-21 ENCOUNTER — Ambulatory Visit: Payer: Medicare Other

## 2018-04-21 ENCOUNTER — Ambulatory Visit (INDEPENDENT_AMBULATORY_CARE_PROVIDER_SITE_OTHER): Payer: Medicare Other

## 2018-04-21 VITALS — BP 122/70 | HR 63 | Temp 98.0°F | Ht 64.25 in | Wt 197.5 lb

## 2018-04-21 DIAGNOSIS — Z0000 Encounter for general adult medical examination without abnormal findings: Secondary | ICD-10-CM | POA: Diagnosis not present

## 2018-04-21 DIAGNOSIS — I10 Essential (primary) hypertension: Secondary | ICD-10-CM

## 2018-04-21 DIAGNOSIS — E7800 Pure hypercholesterolemia, unspecified: Secondary | ICD-10-CM

## 2018-04-21 DIAGNOSIS — E538 Deficiency of other specified B group vitamins: Secondary | ICD-10-CM | POA: Diagnosis not present

## 2018-04-21 LAB — VITAMIN B12: Vitamin B-12: 506 pg/mL (ref 211–911)

## 2018-04-21 LAB — LIPID PANEL
Cholesterol: 201 mg/dL — ABNORMAL HIGH (ref 0–200)
HDL: 63.2 mg/dL (ref >39.00)
LDL Cholesterol: 104 mg/dL — ABNORMAL HIGH (ref 0–99)
NonHDL: 137.47
Total CHOL/HDL Ratio: 3
Triglycerides: 165 mg/dL — ABNORMAL HIGH (ref 0.0–149.0)
VLDL: 33 mg/dL (ref 0.0–40.0)

## 2018-04-21 LAB — COMPREHENSIVE METABOLIC PANEL
ALT: 11 U/L (ref 0–35)
AST: 13 U/L (ref 0–37)
Albumin: 4 g/dL (ref 3.5–5.2)
Alkaline Phosphatase: 53 U/L (ref 39–117)
BUN: 18 mg/dL (ref 6–23)
CO2: 28 mEq/L (ref 19–32)
Calcium: 9.9 mg/dL (ref 8.4–10.5)
Chloride: 104 mEq/L (ref 96–112)
Creatinine, Ser: 0.87 mg/dL (ref 0.40–1.20)
GFR: 66.41 mL/min (ref >60.00)
Glucose, Bld: 97 mg/dL (ref 70–99)
Potassium: 4.5 mEq/L (ref 3.5–5.1)
Sodium: 140 mEq/L (ref 135–145)
Total Bilirubin: 0.4 mg/dL (ref 0.2–1.2)
Total Protein: 6.9 g/dL (ref 6.0–8.3)

## 2018-04-21 LAB — CBC WITH DIFFERENTIAL/PLATELET
Basophils Absolute: 0.1 10*3/uL (ref 0.0–0.1)
Basophils Relative: 1 % (ref 0.0–3.0)
Eosinophils Absolute: 0.2 10*3/uL (ref 0.0–0.7)
Eosinophils Relative: 2.7 % (ref 0.0–5.0)
HCT: 37.6 % (ref 36.0–46.0)
Hemoglobin: 12.7 g/dL (ref 12.0–15.0)
Lymphocytes Relative: 36.8 % (ref 12.0–46.0)
Lymphs Abs: 2.4 10*3/uL (ref 0.7–4.0)
MCHC: 33.7 g/dL (ref 30.0–36.0)
MCV: 92.9 fl (ref 78.0–100.0)
Monocytes Absolute: 0.6 10*3/uL (ref 0.1–1.0)
Monocytes Relative: 8.8 % (ref 3.0–12.0)
Neutro Abs: 3.4 10*3/uL (ref 1.4–7.7)
Neutrophils Relative %: 50.7 % (ref 43.0–77.0)
Platelets: 303 10*3/uL (ref 150.0–400.0)
RBC: 4.05 Mil/uL (ref 3.87–5.11)
RDW: 14.2 % (ref 11.5–15.5)
WBC: 6.7 10*3/uL (ref 4.0–10.5)

## 2018-04-21 LAB — TSH: TSH: 1.06 u[IU]/mL (ref 0.35–4.50)

## 2018-04-21 NOTE — Patient Instructions (Signed)
Tammy Manning , Thank you for taking time to come for your Medicare Wellness Visit. I appreciate your ongoing commitment to your health goals. Please review the following plan we discussed and let me know if I can assist you in the future.   These are the goals we discussed: Goals    . LDL CALC < 130    . Patient Stated     Starting 04/21/2018, I will continue to walk 2-3 miles daily.        This is a list of the screening recommended for you and due dates:  Health Maintenance  Topic Date Due  . Flu Shot  06/23/2018  . Mammogram  08/02/2018  . Tetanus Vaccine  03/01/2023  . DEXA scan (bone density measurement)  Completed  . Pneumonia vaccines  Completed   Preventive Care for Adults  A healthy lifestyle and preventive care can promote health and wellness. Preventive health guidelines for adults include the following key practices.  . A routine yearly physical is a good way to check with your health care provider about your health and preventive screening. It is a chance to share any concerns and updates on your health and to receive a thorough exam.  . Visit your dentist for a routine exam and preventive care every 6 months. Brush your teeth twice a day and floss once a day. Good oral hygiene prevents tooth decay and gum disease.  . The frequency of eye exams is based on your age, health, family medical history, use  of contact lenses, and other factors. Follow your health care provider's recommendations for frequency of eye exams.  . Eat a healthy diet. Foods like vegetables, fruits, whole grains, low-fat dairy products, and lean protein foods contain the nutrients you need without too many calories. Decrease your intake of foods high in solid fats, added sugars, and salt. Eat the right amount of calories for you. Get information about a proper diet from your health care provider, if necessary.  . Regular physical exercise is one of the most important things you can do for your health.  Most adults should get at least 150 minutes of moderate-intensity exercise (any activity that increases your heart rate and causes you to sweat) each week. In addition, most adults need muscle-strengthening exercises on 2 or more days a week.  Silver Sneakers may be a benefit available to you. To determine eligibility, you may visit the website: www.silversneakers.com or contact program at (307)020-1978 Mon-Fri between 8AM-8PM.   . Maintain a healthy weight. The body mass index (BMI) is a screening tool to identify possible weight problems. It provides an estimate of body fat based on height and weight. Your health care provider can find your BMI and can help you achieve or maintain a healthy weight.   For adults 20 years and older: ? A BMI below 18.5 is considered underweight. ? A BMI of 18.5 to 24.9 is normal. ? A BMI of 25 to 29.9 is considered overweight. ? A BMI of 30 and above is considered obese.   . Maintain normal blood lipids and cholesterol levels by exercising and minimizing your intake of saturated fat. Eat a balanced diet with plenty of fruit and vegetables. Blood tests for lipids and cholesterol should begin at age 51 and be repeated every 5 years. If your lipid or cholesterol levels are high, you are over 50, or you are at high risk for heart disease, you may need your cholesterol levels checked more frequently. Ongoing high  lipid and cholesterol levels should be treated with medicines if diet and exercise are not working.  . If you smoke, find out from your health care provider how to quit. If you do not use tobacco, please do not start.  . If you choose to drink alcohol, please do not consume more than 2 drinks per day. One drink is considered to be 12 ounces (355 mL) of beer, 5 ounces (148 mL) of wine, or 1.5 ounces (44 mL) of liquor.  . If you are 50-46 years old, ask your health care provider if you should take aspirin to prevent strokes.  . Use sunscreen. Apply sunscreen  liberally and repeatedly throughout the day. You should seek shade when your shadow is shorter than you. Protect yourself by wearing long sleeves, pants, a wide-brimmed hat, and sunglasses year round, whenever you are outdoors.  . Once a month, do a whole body skin exam, using a mirror to look at the skin on your back. Tell your health care provider of new moles, moles that have irregular borders, moles that are larger than a pencil eraser, or moles that have changed in shape or color.

## 2018-04-21 NOTE — Progress Notes (Signed)
PCP notes:   Health maintenance:  No gaps identified.  Abnormal screenings:   None  Patient concerns:   Pt reports blood in stool. Colonoscopy has been scheduled.    Nurse concerns:  None  Next PCP appt:   04/22/18 @ 0830  I reviewed health advisor's note, was available for consultation, and agree with documentation and plan. Loura Pardon MD

## 2018-04-21 NOTE — Progress Notes (Signed)
Subjective:   Tammy Manning is a 83 y.o. female who presents for Medicare Annual (Subsequent) preventive examination.  Review of Systems:  N/A Cardiac Risk Factors include: advanced age (>91mn, >>89women);dyslipidemia;hypertension;obesity (BMI >30kg/m2)     Objective:     Vitals: BP 122/70 (BP Location: Right Arm, Patient Position: Sitting, Cuff Size: Normal)   Pulse 63   Temp 98 F (36.7 C) (Oral)   Ht 5' 4.25" (1.632 m) Comment: no shoes  Wt 197 lb 8 oz (89.6 kg)   SpO2 94%   BMI 33.64 kg/m   Body mass index is 33.64 kg/m.  Advanced Directives 04/21/2018 04/16/2017 10/21/2016 09/15/2016 09/03/2016 07/28/2016 04/14/2016  Does Patient Have a Medical Advance Directive? Yes Yes Yes No No Yes Yes  Type of Advance Directive Living will Living will [place] - - Living will Living will  Does patient want to make changes to medical advance directive? - - - - - No - Patient declined No - Patient declined  Copy of [place] Chart? - - - - - No - copy requested No - copy requested  Would patient like information on creating a medical advance directive? - - - No - patient declined information Yes -Higher education careers advisergiven - -    Tobacco Social History   Tobacco Use  Smoking Status Former Smoker  . Packs/day: 1.00  . Years: 12.00  . Pack years: 12.00  . Types: Cigarettes  . Last attempt to quit: 11/24/1963  . Years since quitting: 54.4  Smokeless Tobacco Never Used     Counseling given: No   Clinical Intake:  Pre-visit preparation completed: Yes  Pain : No/denies pain Pain Score: 0-No pain     Nutritional Status: BMI > 30  Obese Nutritional Risks: None  How often do you need to have someone help you when you read instructions, pamphlets, or other written materials from your doctor or pharmacy?: 1 - Never What is the last grade level you completed in school?: Associates degree  Interpreter Needed?:  No  Comments: pt lives with spouse Information entered by :: LPinson, LPN  Past Medical History:  Diagnosis Date  . Anginal pain ([place]   . Arthritis    Knees  . B12 deficiency    Mild  . Bucket-handle tear of lateral meniscus of left knee as current injury 12/30/2012  . Cancer (HCC)    Skin, basal cell on nose and eyelid  . DJD (degenerative joint disease)    Low back  . Dyspepsia   . GERD (gastroesophageal reflux disease)   . Heart murmur   . Heart palpitations   . HOH (hard of hearing)   . Hyperlipidemia   . Hypertension   . Lactose intolerance   . Mixed incontinence   . PONV (postoperative nausea and vomiting)   . Vulvar irritation    from urine pad, uses Triamcinolone oint.   Past Surgical History:  Procedure Laterality Date  . ABDOMINAL AORTOGRAM N/A 03/15/2017   Procedure: Abdominal Aortogram;  Surgeon: MWellington Hampshire MD;  [facility];  Service: Cardiovascular;  Laterality: N/A;  . ABDOMINAL HYSTERECTOMY  1982   Large fibroids and bleeding  . APPENDECTOMY    . BILATERAL SALPINGOOPHORECTOMY  1986  . BONE GRAFT HIP ILIAC CREST     To Left arm  . BREAST REDUCTION SURGERY    . BREAST SURGERY    . CATARACT EXTRACTION W/PHACO Left 07/07/2016   Procedure:  CATARACT EXTRACTION PHACO AND INTRAOCULAR LENS PLACEMENT (IOC);  Surgeon: Birder Robson, MD;  Location: [facility];  Service: Ophthalmology;  Laterality: Left;  [place] 01:10 AP% 18.3 CDE 12.91 Fluid pak lot # [id number]  . CATARACT EXTRACTION W/PHACO Right 07/28/2016   Procedure: CATARACT EXTRACTION PHACO AND INTRAOCULAR LENS PLACEMENT ([place]);  Surgeon: Birder Robson, MD;  Location: [facility];  Service: Ophthalmology;  Laterality: Right;  [place] 01:26 AP% 18.5 CDE 16.12 Fluid pack lot # [id number]  . COLONOSCOPY  07/2016   Dr. Thana Farr  . DILATION AND CURETTAGE OF UTERUS     miscarragesx2  . ESOPHAGOGASTRODUODENOSCOPY     Polyps  . [place]  2006  . KNEE ARTHROSCOPY WITH LATERAL MENISECTOMY  Left 12/30/2012   Procedure: KNEE ARTHROSCOPY WITH LATERAL MENISECTOMY;  Surgeon: Johnny Bridge, MD;  Location: [place];  Service: Orthopedics;  Laterality: Left;  LEFT KNEE SCOPE LATERAL MENISCECTOMY  . LEFT HEART CATH AND CORONARY ANGIOGRAPHY N/A 03/15/2017   Procedure: Left Heart Cath and Coronary Angiography;  Surgeon: Wellington Hampshire, MD;  Location: [place] [facility];  Service: Cardiovascular;  Laterality: N/A;  . MOUTH SURGERY    . skin cancer eyelid  2012  . TONSILLECTOMY     Family History  Problem Relation Age of Onset  . Cancer Mother        Colon  . Heart disease Father        CAD  . Diabetes Sister   . Diabetes Brother   . Leukemia Brother   . Cancer Paternal Aunt        Breast  . Diabetes Sister    Social History   Socioeconomic History  . Marital status: Married    Spouse name: Not on file  . Number of children: 2  . Years of education: Not on file  . Highest education level: Not on file  Occupational History  . Occupation: Architect: RETIRED  Social Needs  . Financial resource strain: Not on file  . Food insecurity:    Worry: Not on file    Inability: Not on file  . Transportation needs:    Medical: Not on file    Non-medical: Not on file  Tobacco Use  . Smoking status: Former Smoker    Packs/day: 1.00    Years: 12.00    Pack years: 12.00    Types: Cigarettes    Last attempt to quit: 11/24/1963    Years since quitting: 54.4  . Smokeless tobacco: Never Used  Substance and Sexual Activity  . Alcohol use: No    Alcohol/week: 0.0 oz    Comment: Rare  . Drug use: No  . Sexual activity: Yes  Lifestyle  . Physical activity:    Days per week: Not on file    Minutes per session: Not on file  . Stress: Not on file  Relationships  . Social connections:    Talks on phone: Not on file    Gets together: Not on file    Attends religious service: Not on file    Active member of club or organization: Not on file     Attends meetings of clubs or organizations: Not on file    Relationship status: Not on file  Other Topics Concern  . Not on file  Social History Narrative   Metallurgist.      Outpatient Encounter Medications as of 04/21/2018  Medication Sig  . acetaminophen (TYLENOL) 500 MG tablet Take 500  mg by mouth every 6 (six) hours as needed.  [name] Kitchen atorvastatin (LIPITOR) 10 MG tablet TAKE 1 TABLET(10 MG) BY MOUTH DAILY AT 6 PM  . Calcium Carbonate-Vitamin D (CALCIUM 600+D PO) Take 1 tablet by mouth. TAKES 1200 T D 500  . chlorthalidone (HYGROTON) 25 MG tablet Take 1/2 tablet every two days.  . clorazepate (TRANXENE) 7.5 MG tablet Take 1 tablet (7.5 mg total) by mouth daily as needed.  . irbesartan (AVAPRO) 300 MG tablet Take 1 tablet (300 mg total) by mouth daily.  [name] Kitchen omeprazole (PRILOSEC) 20 MG capsule TAKE 1 CAPSULE(20 MG) BY MOUTH DAILY AS NEEDED FOR HEARTBURN  . [DISCONTINUED] nitrofurantoin, macrocrystal-monohydrate, (MACROBID) 100 MG capsule Take 1 capsule (100 mg total) by mouth 2 (two) times daily.   No facility-administered encounter medications on file as of 04/21/2018.     Activities of Daily Living In your present state of health, do you have any difficulty performing the following activities: 04/21/2018  Hearing? Y  Vision? N  Difficulty concentrating or making decisions? N  Walking or climbing stairs? N  Dressing or bathing? N  Doing errands, shopping? N  Preparing Food and eating ? N  Using the Toilet? N  In the past six months, have you accidently leaked urine? N  Do you have problems with loss of bowel control? N  Managing your Medications? N  Managing your Finances? N  Housekeeping or managing your Housekeeping? N  Some recent data might be hidden    Patient Care Team: [name], [name], MD as PCP - General Susa Day, MD (Orthopedic Surgery) Richmond Campbell, MD (Gastroenterology) Wellington Hampshire, MD as Consulting Physician (Cardiology) Birder Robson, MD as  Referring Physician (Ophthalmology)    Assessment:   This is a routine wellness examination for [place].  Exercise Activities and Dietary recommendations Current Exercise Habits: Home exercise routine, Type of exercise: walking, Time (Minutes): 30(2-3 miles daily), Frequency (Times/Week): 7, Weekly Exercise (Minutes/Week): 210, Intensity: Mild  Goals    . LDL CALC < 130    . Patient Stated     Starting 04/21/2018, I will continue to walk 2-3 miles daily.        Fall Risk Fall Risk  04/21/2018 04/16/2017 10/21/2016 09/15/2016 04/14/2016  Falls in the past year? No No No No No  Risk for fall due to : - - - Other (Comment) -   Depression Screen PHQ 2/9 Scores 04/21/2018 04/16/2017 10/21/2016 09/15/2016  PHQ - 2 Score 0 0 0 0  PHQ- 9 Score 0 - - -  Exception Documentation - - - Other- indicate reason in comment box     Cognitive Function MMSE - Mini Mental State Exam 04/21/2018 04/16/2017 04/14/2016  Orientation to time 5 5 5   Orientation to Place 5 5 5   Registration 3 3 3   Attention/ Calculation 0 0 0  Recall 3 3 3   Language- name 2 objects 0 0 0  Language- repeat 1 1 1   Language- follow 3 step command 3 3 3   Language- read & follow direction 0 0 0  Write a sentence 0 0 0  Copy design 0 0 0  Total score 20 20 20      PLEASE NOTE: A Mini-Cog screen was completed. Maximum score is 20. A value of 0 denotes this part of [place] MMSE was not completed or the patient failed this part of the Mini-Cog screening.   Mini-Cog Screening Orientation to Time - Max 5 pts Orientation to Place - Max 5 pts Registration -  Max 3 pts Recall - Max 3 pts Language Repeat - Max 1 pts Language Follow 3 Step Command - Max 3 pts     Immunization History  Administered Date(s) Administered  . Influenza Split 07/24/2011, 08/23/2014  . Influenza Whole 08/23/2004, 07/24/2009, 08/06/2010, 08/05/2012  . Influenza, High Dose Seasonal PF 08/11/2016, 08/27/2017  . Influenza,inj,Quad PF,6+ Mos 08/16/2013,  08/07/2015  . Pneumococcal Conjugate-13 08/24/2014  . Pneumococcal Polysaccharide-23 08/20/2008  . Td 03/20/2003, 02/28/2013  . Zoster 09/04/2015    Screening Tests Health Maintenance  Topic Date Due  . INFLUENZA VACCINE  06/23/2018  . MAMMOGRAM  08/02/2018  . TETANUS/TDAP  03/01/2023  . DEXA SCAN  Completed  . PNA vac Low Risk Adult  Completed       Plan:     I have personally reviewed, addressed, and noted the following in the patient's chart:  A. Medical and social history B. Use of alcohol, tobacco or illicit drugs  C. Current medications and supplements D. Functional ability and status E.  Nutritional status F.  Physical activity G. Advance directives H. List of other physicians I.  Hospitalizations, surgeries, and ER visits in previous 12 months J.  [place] to include hearing, vision, cognitive, depression L. Referrals and appointments - none  In addition, I have reviewed and discussed with patient certain preventive protocols, quality metrics, and best practice recommendations. A written personalized care plan for preventive services as well as general preventive health recommendations were provided to patient.  See attached scanned questionnaire for additional information.   Signed,   Lindell Noe, MHA, BS, LPN Health Coach

## 2018-04-22 ENCOUNTER — Encounter: Payer: Self-pay | Admitting: Family Medicine

## 2018-04-22 ENCOUNTER — Ambulatory Visit (INDEPENDENT_AMBULATORY_CARE_PROVIDER_SITE_OTHER): Payer: Medicare Other | Admitting: Family Medicine

## 2018-04-22 VITALS — BP 110/62 | HR 68 | Temp 97.8°F | Ht 64.25 in | Wt 197.0 lb

## 2018-04-22 DIAGNOSIS — E538 Deficiency of other specified B group vitamins: Secondary | ICD-10-CM

## 2018-04-22 DIAGNOSIS — E7800 Pure hypercholesterolemia, unspecified: Secondary | ICD-10-CM | POA: Diagnosis not present

## 2018-04-22 DIAGNOSIS — I480 Paroxysmal atrial fibrillation: Secondary | ICD-10-CM

## 2018-04-22 DIAGNOSIS — I10 Essential (primary) hypertension: Secondary | ICD-10-CM

## 2018-04-22 MED ORDER — CLOBETASOL PROPIONATE 0.05 % EX OINT: 1.0000 "application " | TOPICAL_OINTMENT | Freq: Two times a day (BID) | CUTANEOUS | 1 refills | Status: DC

## 2018-04-22 MED ORDER — OMEPRAZOLE 20 MG PO CPDR: DELAYED_RELEASE_CAPSULE | ORAL | 3 refills | Status: DC

## 2018-04-22 MED ORDER — ATORVASTATIN CALCIUM 10 MG PO TABS: ORAL_TABLET | ORAL | 3 refills | Status: DC

## 2018-04-22 NOTE — Patient Instructions (Addendum)
If you are interested in the new shingles vaccine (Shingrix) - call your local pharmacy to check on coverage and availability  Call your pharmacy to get on a wait list if you want to get it    Keep up the good work with weight loss and self care   Get colonoscopy as planned

## 2018-04-22 NOTE — Progress Notes (Signed)
Subjective:    Patient ID: Tammy Manning, female    DOB: 29-May-1935, 83 y.o.   MRN: [id number]  HPI Here for annual f/u of chronic medical problems   Helping daughter move to [facility] - a little stressed   Wt Readings from Last 3 Encounters:  04/22/18 197 lb (89.4 kg)  04/21/18 197 lb 8 oz (89.6 kg)  02/16/18 200 lb 8 oz (90.9 kg)  has lost weight this year /making the effort  Also less appetite than she used to  [facility] every day for exercise  33.55 kg/m   amw was 5/30-no gaps   Mammogram 9/18- negative  Self breast exam - no lumps   No gyn problems   dexa 11/12 normal BMD No falls or fx On ca and D  Colonoscopy 8/17 adenoma - 3-5 y recall Has blood in stool lately  Colonoscopy is scheduled for next Thursday  Saw Dr Cherylynn Ridges office eval and did not see anything/ symptoms come and go   zostavax 10/16   bp is stable today  No cp or palpitations or headaches or edema  No side effects to medicines  BP Readings from Last 3 Encounters:  04/22/18 110/62  04/21/18 122/70  02/16/18 130/64    Finally better controlled  Tolerating current medicines   Lab Results  Component Value Date   CREATININE 0.87 04/21/2018   BUN 18 04/21/2018   NA 140 04/21/2018   K 4.5 04/21/2018   CL 104 04/21/2018   CO2 28 04/21/2018   Lab Results  Component Value Date   ALT 11 04/21/2018   AST 13 04/21/2018   ALKPHOS 53 04/21/2018   BILITOT 0.4 04/21/2018    Lab Results  Component Value Date   WBC 6.7 04/21/2018   HGB 12.7 04/21/2018   HCT 37.6 04/21/2018   MCV 92.9 04/21/2018   PLT 303.0 04/21/2018    Lab Results  Component Value Date   TSH 1.06 04/21/2018      Low B12 Lab Results  Component Value Date   VITAMINB12 506 04/21/2018   Gets inj monthly  Hyperlipidemia Lab Results  Component Value Date   CHOL 201 (H) 04/21/2018   CHOL 200 04/16/2017   CHOL 203 (H) 03/05/2016   Lab Results  Component Value Date   HDL 63.20 04/21/2018   HDL 63.20 04/16/2017   HDL 68.10 03/05/2016   Lab Results  Component Value Date   LDLCALC 104 (H) 04/21/2018   LDLCALC 115 (H) 04/16/2017   LDLCALC 117 (H) 03/05/2016   Lab Results  Component Value Date   TRIG 165.0 (H) 04/21/2018   TRIG 108.0 04/16/2017   TRIG 90.0 03/05/2016   Lab Results  Component Value Date   CHOLHDL 3 04/21/2018   CHOLHDL 3 04/16/2017   CHOLHDL 3 03/05/2016   Lab Results  Component Value Date   LDLDIRECT 105.9 02/08/2013   LDLDIRECT 125.0 09/12/2012   LDLDIRECT 119.3 12/31/2011   Atorvastatin 10  Was not fasting   Needs refill of clobetasol for skin irritation    Patient Active Problem List   Diagnosis Date Noted  . RUQ pain 02/16/2018  . Angina, class II ([place]) 09/10/2015  . Aortic insufficiency 09/10/2015  . Stress reaction 09/08/2015  . Encounter for Medicare annual wellness exam 02/28/2013  . Bucket-handle tear of lateral meniscus of left knee as current injury 12/30/2012  . Episodic atrial fibrillation ([employer]) 11/17/2012  . Special screening for malignant neoplasms, colon 01/21/2012  . Hearing loss of both ears 01/14/2012  .  Adverse drug reaction 03/12/2011  . HEADACHE, CHRONIC 09/24/2010  . B12 deficiency 03/17/2010  . DYSPEPSIA 03/17/2010  . POSTMENOPAUSAL STATUS 08/20/2008  . Essential hypertension 07/19/2007  . Hyperlipidemia 06/24/2007  . CONSTIPATION 06/24/2007  . IBS 06/24/2007  . DERMATITIS, ATOPIC 06/24/2007  . SKIN CANCER, HX OF 06/24/2007   Past Medical History:  Diagnosis Date  . Anginal pain ([place])   . Arthritis    Knees  . B12 deficiency    Mild  . Bucket-handle tear of lateral meniscus of left knee as current injury 12/30/2012  . Cancer (HCC)    Skin, basal cell on nose and eyelid  . DJD (degenerative joint disease)    Low back  . Dyspepsia   . GERD (gastroesophageal reflux disease)   . Heart murmur   . Heart palpitations   . HOH (hard of hearing)   . Hyperlipidemia   . Hypertension   . Lactose intolerance   . Mixed incontinence    . PONV (postoperative nausea and vomiting)   . Vulvar irritation    from urine pad, uses Triamcinolone oint.   Past Surgical History:  Procedure Laterality Date  . ABDOMINAL AORTOGRAM N/A 03/15/2017   Procedure: Abdominal Aortogram;  Surgeon: Wellington Hampshire, MD;  Location: [place] [facility];  Service: Cardiovascular;  Laterality: N/A;  . ABDOMINAL HYSTERECTOMY  1982   Large fibroids and bleeding  . APPENDECTOMY    . BILATERAL SALPINGOOPHORECTOMY  1986  . BONE GRAFT HIP ILIAC CREST     To Left arm  . BREAST REDUCTION SURGERY    . BREAST SURGERY    . CATARACT EXTRACTION W/PHACO Left 07/07/2016   Procedure: CATARACT EXTRACTION PHACO AND INTRAOCULAR LENS PLACEMENT (IOC);  Surgeon: Birder Robson, MD;  Location: [facility];  Service: Ophthalmology;  Laterality: Left;  [place] 01:10 AP% 18.3 CDE 12.91 Fluid pak lot # [id number]  . CATARACT EXTRACTION W/PHACO Right 07/28/2016   Procedure: CATARACT EXTRACTION PHACO AND INTRAOCULAR LENS PLACEMENT ([place]);  Surgeon: Birder Robson, MD;  Location: [facility];  Service: Ophthalmology;  Laterality: Right;  [place] 01:26 AP% 18.5 CDE 16.12 Fluid pack lot # [id number]  . COLONOSCOPY  07/2016   Dr. Thana Farr  . DILATION AND CURETTAGE OF UTERUS     miscarragesx2  . ESOPHAGOGASTRODUODENOSCOPY     Polyps  . [place]  2006  . KNEE ARTHROSCOPY WITH LATERAL MENISECTOMY Left 12/30/2012   Procedure: KNEE ARTHROSCOPY WITH LATERAL MENISECTOMY;  Surgeon: Johnny Bridge, MD;  Location: [place];  Service: Orthopedics;  Laterality: Left;  LEFT KNEE SCOPE LATERAL MENISCECTOMY  . LEFT HEART CATH AND CORONARY ANGIOGRAPHY N/A 03/15/2017   Procedure: Left Heart Cath and Coronary Angiography;  Surgeon: Wellington Hampshire, MD;  Location: [place] [facility];  Service: Cardiovascular;  Laterality: N/A;  . MOUTH SURGERY    . skin cancer eyelid  2012  . TONSILLECTOMY     Social History   Tobacco Use  . Smoking status: Former Smoker    Packs/day: 1.00      Years: 12.00    Pack years: 12.00    Types: Cigarettes    Last attempt to quit: 11/24/1963    Years since quitting: 54.4  . Smokeless tobacco: Never Used  Substance Use Topics  . Alcohol use: No    Alcohol/week: 0.0 oz    Comment: Rare  . Drug use: No   Family History  Problem Relation Age of Onset  . Cancer Mother  Colon  . Heart disease Father        CAD  . Diabetes Sister   . Diabetes Brother   . Leukemia Brother   . Cancer Paternal Aunt        Breast  . Diabetes Sister    Allergies  Allergen Reactions  . Amlodipine Besylate Other (See Comments)    Reaction:  Increases BP  . Antihistamines, Chlorpheniramine-Type Other (See Comments)    Reaction:  Makes pt hyper   . Atenolol Hypertension  . Cefuroxime Axetil Other (See Comments)    Upset stomach  . Chlorthalidone Other (See Comments)  . Ciprofloxacin Other (See Comments)    Upset stomach  . Co Q 10 [Coenzyme Q10] Other (See Comments)    Reaction:  GI upset   . Crestor [Rosuvastatin Calcium] Other (See Comments)    Reaction:  Myalgias   . Dextromethorphan-Guaifenesin Other (See Comments)    Reaction:  Made pt jittery   . Epinephrine Hives  . Ramipril Other (See Comments)    Upset stomach  . Sulfamethoxazole-Trimethoprim Other (See Comments)    Upset stomach  . Vitamin D Analogs Other (See Comments)    Reaction:  GI upset    Current Outpatient Medications on File Prior to Visit  Medication Sig Dispense Refill  . acetaminophen (TYLENOL) 500 MG tablet Take 500 mg by mouth every 6 (six) hours as needed.    [name] Kitchen aspirin EC 81 MG tablet Take by mouth.    . Calcium Carbonate-Vitamin D (CALCIUM 600+D PO) Take 1 tablet by mouth. TAKES 1200 T D 500    . Calcium Carbonate-Vitamin D 600-200 MG-UNIT CAPS Take by mouth.    . chlorthalidone (HYGROTON) 25 MG tablet Take 1/2 tablet every two days.    . clorazepate (TRANXENE) 7.5 MG tablet Take 1 tablet (7.5 mg total) by mouth daily as needed. 30 tablet 0  .  cyanocobalamin (,VITAMIN B-12,) 1000 MCG/ML injection Inject into the muscle.    . hydrocortisone (ANUSOL-HC) 2.5 % rectal cream     . irbesartan (AVAPRO) 300 MG tablet Take 1 tablet (300 mg total) by mouth daily. 90 tablet 3   No current facility-administered medications on file prior to visit.     Review of Systems  Constitutional: Negative for activity change, appetite change, fatigue, fever and unexpected weight change.  HENT: Negative for congestion, ear pain, rhinorrhea, sinus pressure and sore throat.   Eyes: Negative for pain, redness and visual disturbance.  Respiratory: Negative for cough, shortness of breath and wheezing.   Cardiovascular: Negative for chest pain and palpitations.  Gastrointestinal: Positive for anal bleeding. Negative for abdominal pain, blood in stool, constipation and diarrhea.  Endocrine: Negative for polydipsia and polyuria.  Genitourinary: Negative for dysuria, frequency and urgency.  Musculoskeletal: Negative for arthralgias, back pain and myalgias.  Skin: Negative for pallor and rash.  Allergic/Immunologic: Negative for environmental allergies.  Neurological: Negative for dizziness, syncope and headaches.  Hematological: Negative for adenopathy. Does not bruise/bleed easily.  Psychiatric/Behavioral: Negative for decreased concentration and dysphoric mood. The patient is not nervous/anxious.        Objective:   Physical Exam  Constitutional: She appears well-developed and well-nourished. No distress.  obese and well appearing   HENT:  Head: Normocephalic and atraumatic.  Right Ear: External ear normal.  Left Ear: External ear normal.  Mouth/Throat: Oropharynx is clear and moist.  Eyes: Pupils are equal, round, and reactive to light. Conjunctivae and EOM are normal. No scleral icterus.  Neck: Normal range  of motion. Neck supple. No JVD present. Carotid bruit is not present. No thyromegaly present.  Cardiovascular: Normal rate, regular rhythm,  normal heart sounds and intact distal pulses. Exam reveals no gallop.  Pulmonary/Chest: Effort normal and breath sounds normal. No respiratory distress. She has no wheezes. She exhibits no tenderness. No breast tenderness, discharge or bleeding.  Abdominal: Soft. Bowel sounds are normal. She exhibits no distension, no abdominal bruit and no mass. There is no tenderness.  Genitourinary: No breast tenderness, discharge or bleeding.  Genitourinary Comments: Breast exam: No mass, nodules, thickening, tenderness, bulging, retraction, inflamation, nipple discharge or skin changes noted.  No axillary or clavicular LA.      Musculoskeletal: Normal range of motion. She exhibits no edema or tenderness.  Lymphadenopathy:    She has no cervical adenopathy.  Neurological: She is alert. She has normal reflexes. No cranial nerve deficit. She exhibits normal muscle tone. Coordination normal.  Skin: Skin is warm and dry. No rash noted. No erythema. No pallor.  Solar lentigines diffusely   Psychiatric: She has a normal mood and affect.  pleasant          Assessment & Plan:   Problem List Items Addressed This Visit      Cardiovascular and Mediastinum   Episodic atrial fibrillation (HCC)    Stable normal rhythm      Relevant Medications   aspirin EC 81 MG tablet   atorvastatin (LIPITOR) 10 MG tablet   Essential hypertension - Primary   Relevant Medications   aspirin EC 81 MG tablet   atorvastatin (LIPITOR) 10 MG tablet     Other   B12 deficiency    Lab Results  Component Value Date   VITAMINB12 506 04/21/2018   Continue monthly inj      Hyperlipidemia    Disc goals for lipids and reasons to control them Rev last labs with pt Rev low sat fat diet in detail Continue atorvastatin        Relevant Medications   aspirin EC 81 MG tablet   atorvastatin (LIPITOR) 10 MG tablet

## 2018-04-24 NOTE — Assessment & Plan Note (Addendum)
Disc goals for lipids and reasons to control them Rev last labs with pt Rev low sat fat diet in detail Continue atorvastatin

## 2018-04-24 NOTE — Assessment & Plan Note (Signed)
Stable normal rhythm

## 2018-04-24 NOTE — Assessment & Plan Note (Signed)
Lab Results  Component Value Date   [id number] 04/21/2018   Continue monthly inj

## 2018-04-28 DIAGNOSIS — Z1211 Encounter for screening for malignant neoplasm of colon: Secondary | ICD-10-CM | POA: Diagnosis not present

## 2018-04-28 DIAGNOSIS — K921 Melena: Secondary | ICD-10-CM | POA: Diagnosis not present

## 2018-04-28 DIAGNOSIS — K5289 Other specified noninfective gastroenteritis and colitis: Secondary | ICD-10-CM | POA: Diagnosis not present

## 2018-04-28 DIAGNOSIS — K5730 Diverticulosis of large intestine without perforation or abscess without bleeding: Secondary | ICD-10-CM | POA: Diagnosis not present

## 2018-04-28 DIAGNOSIS — K648 Other hemorrhoids: Secondary | ICD-10-CM | POA: Diagnosis not present

## 2018-04-28 DIAGNOSIS — K5010 Crohn's disease of large intestine without complications: Secondary | ICD-10-CM | POA: Diagnosis not present

## 2018-04-28 DIAGNOSIS — K625 Hemorrhage of anus and rectum: Secondary | ICD-10-CM | POA: Diagnosis not present

## 2018-04-28 DIAGNOSIS — K219 Gastro-esophageal reflux disease without esophagitis: Secondary | ICD-10-CM | POA: Diagnosis not present

## 2018-04-28 DIAGNOSIS — Z86010 Personal history of colonic polyps: Secondary | ICD-10-CM | POA: Diagnosis not present

## 2018-05-09 ENCOUNTER — Emergency Department: Payer: Medicare Other

## 2018-05-09 ENCOUNTER — Encounter: Payer: Self-pay | Admitting: Emergency Medicine

## 2018-05-09 ENCOUNTER — Observation Stay
Admission: EM | Admit: 2018-05-09 | Discharge: 2018-05-10 | Disposition: A | Discharge status: Home / Self Care | Payer: Medicare Other | Attending: Internal Medicine | Admitting: Internal Medicine

## 2018-05-09 ENCOUNTER — Other Ambulatory Visit: Payer: Self-pay

## 2018-05-09 ENCOUNTER — Observation Stay: Payer: Medicare Other

## 2018-05-09 DIAGNOSIS — E739 Lactose intolerance, unspecified: Secondary | ICD-10-CM | POA: Diagnosis not present

## 2018-05-09 DIAGNOSIS — E785 Hyperlipidemia, unspecified: Secondary | ICD-10-CM | POA: Diagnosis not present

## 2018-05-09 DIAGNOSIS — R410 Disorientation, unspecified: Secondary | ICD-10-CM | POA: Diagnosis not present

## 2018-05-09 DIAGNOSIS — K529 Noninfective gastroenteritis and colitis, unspecified: Secondary | ICD-10-CM | POA: Diagnosis not present

## 2018-05-09 DIAGNOSIS — Z90710 Acquired absence of both cervix and uterus: Secondary | ICD-10-CM | POA: Insufficient documentation

## 2018-05-09 DIAGNOSIS — Z8249 Family history of ischemic heart disease and other diseases of the circulatory system: Secondary | ICD-10-CM | POA: Insufficient documentation

## 2018-05-09 DIAGNOSIS — M170 Bilateral primary osteoarthritis of knee: Secondary | ICD-10-CM | POA: Insufficient documentation

## 2018-05-09 DIAGNOSIS — M1990 Unspecified osteoarthritis, unspecified site: Secondary | ICD-10-CM | POA: Diagnosis not present

## 2018-05-09 DIAGNOSIS — Z9841 Cataract extraction status, right eye: Secondary | ICD-10-CM | POA: Insufficient documentation

## 2018-05-09 DIAGNOSIS — R32 Unspecified urinary incontinence: Secondary | ICD-10-CM | POA: Insufficient documentation

## 2018-05-09 DIAGNOSIS — R4182 Altered mental status, unspecified: Secondary | ICD-10-CM | POA: Diagnosis not present

## 2018-05-09 DIAGNOSIS — E041 Nontoxic single thyroid nodule: Secondary | ICD-10-CM | POA: Diagnosis not present

## 2018-05-09 DIAGNOSIS — E538 Deficiency of other specified B group vitamins: Secondary | ICD-10-CM | POA: Insufficient documentation

## 2018-05-09 DIAGNOSIS — Z87891 Personal history of nicotine dependence: Secondary | ICD-10-CM | POA: Insufficient documentation

## 2018-05-09 DIAGNOSIS — R51 Headache: Secondary | ICD-10-CM | POA: Diagnosis not present

## 2018-05-09 DIAGNOSIS — R413 Other amnesia: Secondary | ICD-10-CM | POA: Diagnosis not present

## 2018-05-09 DIAGNOSIS — K5900 Constipation, unspecified: Secondary | ICD-10-CM | POA: Insufficient documentation

## 2018-05-09 DIAGNOSIS — H9193 Unspecified hearing loss, bilateral: Secondary | ICD-10-CM | POA: Insufficient documentation

## 2018-05-09 DIAGNOSIS — I4891 Unspecified atrial fibrillation: Secondary | ICD-10-CM | POA: Diagnosis not present

## 2018-05-09 DIAGNOSIS — I351 Nonrheumatic aortic (valve) insufficiency: Secondary | ICD-10-CM | POA: Diagnosis not present

## 2018-05-09 DIAGNOSIS — I209 Angina pectoris, unspecified: Secondary | ICD-10-CM | POA: Diagnosis not present

## 2018-05-09 DIAGNOSIS — Z800 Family history of malignant neoplasm of digestive organs: Secondary | ICD-10-CM | POA: Insufficient documentation

## 2018-05-09 DIAGNOSIS — Z7982 Long term (current) use of aspirin: Secondary | ICD-10-CM | POA: Diagnosis not present

## 2018-05-09 DIAGNOSIS — Z79899 Other long term (current) drug therapy: Secondary | ICD-10-CM | POA: Diagnosis not present

## 2018-05-09 DIAGNOSIS — I10 Essential (primary) hypertension: Secondary | ICD-10-CM | POA: Diagnosis not present

## 2018-05-09 DIAGNOSIS — G454 Transient global amnesia: Secondary | ICD-10-CM | POA: Diagnosis not present

## 2018-05-09 DIAGNOSIS — Z9842 Cataract extraction status, left eye: Secondary | ICD-10-CM | POA: Insufficient documentation

## 2018-05-09 DIAGNOSIS — Z888 Allergy status to other drugs, medicaments and biological substances status: Secondary | ICD-10-CM | POA: Insufficient documentation

## 2018-05-09 DIAGNOSIS — Z85828 Personal history of other malignant neoplasm of skin: Secondary | ICD-10-CM | POA: Insufficient documentation

## 2018-05-09 DIAGNOSIS — Z833 Family history of diabetes mellitus: Secondary | ICD-10-CM | POA: Insufficient documentation

## 2018-05-09 DIAGNOSIS — Z882 Allergy status to sulfonamides status: Secondary | ICD-10-CM | POA: Insufficient documentation

## 2018-05-09 DIAGNOSIS — R011 Cardiac murmur, unspecified: Secondary | ICD-10-CM | POA: Insufficient documentation

## 2018-05-09 DIAGNOSIS — G459 Transient cerebral ischemic attack, unspecified: Secondary | ICD-10-CM | POA: Diagnosis not present

## 2018-05-09 DIAGNOSIS — N3000 Acute cystitis without hematuria: Secondary | ICD-10-CM | POA: Diagnosis not present

## 2018-05-09 DIAGNOSIS — R002 Palpitations: Secondary | ICD-10-CM | POA: Insufficient documentation

## 2018-05-09 DIAGNOSIS — Z8673 Personal history of transient ischemic attack (TIA), and cerebral infarction without residual deficits: Secondary | ICD-10-CM | POA: Diagnosis present

## 2018-05-09 DIAGNOSIS — K219 Gastro-esophageal reflux disease without esophagitis: Secondary | ICD-10-CM | POA: Insufficient documentation

## 2018-05-09 DIAGNOSIS — Z803 Family history of malignant neoplasm of breast: Secondary | ICD-10-CM | POA: Insufficient documentation

## 2018-05-09 DIAGNOSIS — Z881 Allergy status to other antibiotic agents status: Secondary | ICD-10-CM | POA: Insufficient documentation

## 2018-05-09 DIAGNOSIS — N390 Urinary tract infection, site not specified: Secondary | ICD-10-CM | POA: Diagnosis not present

## 2018-05-09 DIAGNOSIS — Z806 Family history of leukemia: Secondary | ICD-10-CM | POA: Insufficient documentation

## 2018-05-09 LAB — CBC
HCT: 39.3 % (ref 35.0–47.0)
Hemoglobin: 13.5 g/dL (ref 12.0–16.0)
MCH: 31.9 pg (ref 26.0–34.0)
MCHC: 34.4 g/dL (ref 32.0–36.0)
MCV: 92.7 fL (ref 80.0–100.0)
Platelets: 301 10*3/uL (ref 150–440)
RBC: 4.24 MIL/uL (ref 3.80–5.20)
RDW: 14.2 % (ref 11.5–14.5)
WBC: 8.3 10*3/uL (ref 3.6–11.0)

## 2018-05-09 LAB — PROTIME-INR
INR: 0.94
Prothrombin Time: 12.5 seconds (ref 11.4–15.2)

## 2018-05-09 LAB — URINALYSIS, COMPLETE (UACMP) WITH MICROSCOPIC
Bilirubin Urine: NEGATIVE
Glucose, UA: NEGATIVE mg/dL
Ketones, ur: NEGATIVE mg/dL
Nitrite: NEGATIVE
Protein, ur: NEGATIVE mg/dL
Specific Gravity, Urine: 1.003 — ABNORMAL LOW (ref 1.005–1.030)
pH: 6 (ref 5.0–8.0)

## 2018-05-09 LAB — DIFFERENTIAL
Basophils Absolute: 0.1 10*3/uL (ref 0–0.1)
Basophils Relative: 1 %
Eosinophils Absolute: 0.1 10*3/uL (ref 0–0.7)
Eosinophils Relative: 1 %
Lymphocytes Relative: 22 %
Lymphs Abs: 1.8 10*3/uL (ref 1.0–3.6)
Monocytes Absolute: 0.5 10*3/uL (ref 0.2–0.9)
Monocytes Relative: 6 %
Neutro Abs: 5.8 10*3/uL (ref 1.4–6.5)
Neutrophils Relative %: 70 %

## 2018-05-09 LAB — APTT: aPTT: 27 seconds (ref 24–36)

## 2018-05-09 LAB — TROPONIN I: Troponin I: <0.03 ng/mL (ref <0.03)

## 2018-05-09 LAB — COMPREHENSIVE METABOLIC PANEL
ALT: 13 U/L — ABNORMAL LOW (ref 14–54)
AST: 19 U/L (ref 15–41)
Albumin: 4.3 g/dL (ref 3.5–5.0)
Alkaline Phosphatase: 64 U/L (ref 38–126)
Anion gap: 10 (ref 5–15)
BUN: 20 mg/dL (ref 6–20)
CO2: 25 mmol/L (ref 22–32)
Calcium: 9.7 mg/dL (ref 8.9–10.3)
Chloride: 103 mmol/L (ref 101–111)
Creatinine, Ser: 0.74 mg/dL (ref 0.44–1.00)
GFR calc Af Amer: >60 mL/min (ref >60)
GFR calc non Af Amer: >60 mL/min (ref >60)
Glucose, Bld: 104 mg/dL — ABNORMAL HIGH (ref 65–99)
Potassium: 3.9 mmol/L (ref 3.5–5.1)
Sodium: 138 mmol/L (ref 135–145)
Total Bilirubin: 0.6 mg/dL (ref 0.3–1.2)
Total Protein: 7.5 g/dL (ref 6.5–8.1)

## 2018-05-09 MED ORDER — ENOXAPARIN SODIUM 40 MG/0.4ML ~~LOC~~ SOLN: 40.0000 mg | SUBCUTANEOUS | Status: DC

## 2018-05-09 MED ORDER — SODIUM CHLORIDE 0.9 % IV SOLN
1.0000 g | INTRAVENOUS | Status: DC
  Administered 2018-05-09: 1 g via INTRAVENOUS
  Filled 2018-05-09: qty 10
  Filled 2018-05-09: qty 1

## 2018-05-09 MED ORDER — CYANOCOBALAMIN 1000 MCG/ML IJ SOLN
1000.0000 ug | INTRAMUSCULAR | Status: DC
  Filled 2018-05-09: qty 1

## 2018-05-09 MED ORDER — STROKE: EARLY STAGES OF RECOVERY BOOK
Freq: Once | Status: AC
  Administered 2018-05-09: 20:00:00

## 2018-05-09 MED ORDER — MESALAMINE 1.2 G PO TBEC
1.2000 g | DELAYED_RELEASE_TABLET | Freq: Every day | ORAL | Status: DC
  Administered 2018-05-10: 1.2 g via ORAL
  Filled 2018-05-09: qty 1

## 2018-05-09 MED ORDER — MESALAMINE ER 0.375 G PO CP24: 1.1250 g | ORAL_CAPSULE | Freq: Every day | ORAL | Status: DC

## 2018-05-09 MED ORDER — IRBESARTAN 150 MG PO TABS
75.0000 mg | ORAL_TABLET | Freq: Every day | ORAL | Status: DC
  Administered 2018-05-10: 75 mg via ORAL
  Filled 2018-05-09: qty 1

## 2018-05-09 MED ORDER — CLORAZEPATE DIPOTASSIUM 7.5 MG PO TABS: 7.5000 mg | ORAL_TABLET | Freq: Every day | ORAL | Status: DC | PRN

## 2018-05-09 MED ORDER — SENNOSIDES-DOCUSATE SODIUM 8.6-50 MG PO TABS: 1.0000 | ORAL_TABLET | Freq: Every evening | ORAL | Status: DC | PRN

## 2018-05-09 MED ORDER — ASPIRIN EC 81 MG PO TBEC
81.0000 mg | DELAYED_RELEASE_TABLET | Freq: Every day | ORAL | Status: DC
  Administered 2018-05-10: 09:00:00 81 mg via ORAL
  Filled 2018-05-09: qty 1

## 2018-05-09 MED ORDER — ATORVASTATIN CALCIUM 20 MG PO TABS
10.0000 mg | ORAL_TABLET | Freq: Every day | ORAL | Status: DC
  Administered 2018-05-09: 20:00:00 10 mg via ORAL
  Filled 2018-05-09: qty 1

## 2018-05-09 MED ORDER — PANTOPRAZOLE SODIUM 40 MG PO TBEC
40.0000 mg | DELAYED_RELEASE_TABLET | Freq: Every day | ORAL | Status: DC
Start: 2018-05-09 — End: 2018-05-10
  Administered 2018-05-10: 40 mg via ORAL
  Filled 2018-05-09: qty 1

## 2018-05-09 MED ORDER — CALCIUM CARBONATE-VITAMIN D 500-200 MG-UNIT PO TABS
ORAL_TABLET | Freq: Every day | ORAL | Status: DC
  Administered 2018-05-10: 1 via ORAL
  Filled 2018-05-09: qty 1

## 2018-05-09 MED ORDER — ACETAMINOPHEN 500 MG PO TABS
500.0000 mg | ORAL_TABLET | Freq: Four times a day (QID) | ORAL | Status: DC | PRN
  Administered 2018-05-09: 500 mg via ORAL
  Filled 2018-05-09: qty 1

## 2018-05-09 MED ORDER — HYDROCORTISONE 2.5 % RE CREA
1.0000 "application " | TOPICAL_CREAM | Freq: Two times a day (BID) | RECTAL | Status: DC | PRN
  Filled 2018-05-09: qty 28.35

## 2018-05-09 NOTE — ED Notes (Signed)
Pt was taken to bathroom. No assit needed pt ambulatory. Pt is A/Ox4 family at bedside.

## 2018-05-09 NOTE — H&P (Signed)
[facility] NAME: Tammy Manning    MR#:  [id number]  DATE OF BIRTH:  Jun 24, 1935  DATE OF ADMISSION:  05/09/2018  PRIMARY CARE PHYSICIAN: [name], [name], MD   REQUESTING/REFERRING PHYSICIAN: Arta Silence, MD  CHIEF COMPLAINT:   Altered mental status, headache HISTORY OF PRESENT ILLNESS:  Tammy Manning  is a 83 y.o. female with a known history of essential hypertension, B12 deficiency, hyperlipidemia has experienced altered mental status buttocks of approximately 6 hours prior to her arrival to emergency department.  Patient felt confused and forgetful with blurry vision for approximately 3 hours which is completely resolved at this time.  Denies any dysarthria or swallowing difficulties.  Patient has passed bedside swallow evaluation. CT head neg  PAST MEDICAL HISTORY:   Past Medical History:  Diagnosis Date  . Anginal pain ([place])   . Arthritis    Knees  . B12 deficiency    Mild  . Bucket-handle tear of lateral meniscus of left knee as current injury 12/30/2012  . Cancer (HCC)    Skin, basal cell on nose and eyelid  . DJD (degenerative joint disease)    Low back  . Dyspepsia   . GERD (gastroesophageal reflux disease)   . Heart murmur   . Heart palpitations   . HOH (hard of hearing)   . Hyperlipidemia   . Hypertension   . Lactose intolerance   . Mixed incontinence   . PONV (postoperative nausea and vomiting)   . Vulvar irritation    from urine pad, uses Triamcinolone oint.    PAST SURGICAL HISTOIRY:   Past Surgical History:  Procedure Laterality Date  . ABDOMINAL AORTOGRAM N/A 03/15/2017   Procedure: Abdominal Aortogram;  Surgeon: Wellington Hampshire, MD;  Location: [facility];  Service: Cardiovascular;  Laterality: N/A;  . ABDOMINAL HYSTERECTOMY  1982   Large fibroids and bleeding  . APPENDECTOMY    . BILATERAL SALPINGOOPHORECTOMY  1986  . BONE GRAFT HIP ILIAC CREST     To Left arm  .  BREAST REDUCTION SURGERY    . BREAST SURGERY    . CATARACT EXTRACTION W/PHACO Left 07/07/2016   Procedure: CATARACT EXTRACTION PHACO AND INTRAOCULAR LENS PLACEMENT (IOC);  Surgeon: Birder Robson, MD;  Location: [facility];  Service: Ophthalmology;  Laterality: Left;  [place] 01:10 AP% 18.3 CDE 12.91 Fluid pak lot # [id number]  . CATARACT EXTRACTION W/PHACO Right 07/28/2016   Procedure: CATARACT EXTRACTION PHACO AND INTRAOCULAR LENS PLACEMENT ([place]);  Surgeon: Birder Robson, MD;  Location: [facility];  Service: Ophthalmology;  Laterality: Right;  [place] 01:26 AP% 18.5 CDE 16.12 Fluid pack lot # [id number]  . COLONOSCOPY  07/2016   Dr. Thana Farr  . DILATION AND CURETTAGE OF UTERUS     miscarragesx2  . ESOPHAGOGASTRODUODENOSCOPY     Polyps  . [place]  2006  . KNEE ARTHROSCOPY WITH LATERAL MENISECTOMY Left 12/30/2012   Procedure: KNEE ARTHROSCOPY WITH LATERAL MENISECTOMY;  Surgeon: Johnny Bridge, MD;  Location: [facility];  Service: Orthopedics;  Laterality: Left;  LEFT KNEE SCOPE LATERAL MENISCECTOMY  . LEFT HEART CATH AND CORONARY ANGIOGRAPHY N/A 03/15/2017   Procedure: Left Heart Cath and Coronary Angiography;  Surgeon: Wellington Hampshire, MD;  Location: [facility];  Service: Cardiovascular;  Laterality: N/A;  . MOUTH SURGERY    . skin cancer eyelid  2012  . TONSILLECTOMY      SOCIAL HISTORY:   Social History  Tobacco Use  . Smoking status: Former Smoker    Packs/day: 1.00    Years: 12.00    Pack years: 12.00    Types: Cigarettes    Last attempt to quit: 11/24/1963    Years since quitting: 54.4  . Smokeless tobacco: Never Used  Substance Use Topics  . Alcohol use: No    Alcohol/week: 0.0 oz    Comment: Rare    FAMILY HISTORY:   Family History  Problem Relation Age of Onset  . Cancer Mother        Colon  . Heart disease Father        CAD  . Diabetes Sister   . Diabetes Brother   . Leukemia Brother   . Cancer Paternal Aunt        Breast  .  Diabetes Sister     DRUG ALLERGIES:   Allergies  Allergen Reactions  . Amlodipine Besylate Other (See Comments)    Reaction:  Increases BP  . Antihistamines, Chlorpheniramine-Type Other (See Comments)    Reaction:  Makes pt hyper   . Atenolol Hypertension  . Cefuroxime Axetil Other (See Comments)    Upset stomach  . Chlorthalidone Other (See Comments)  . Ciprofloxacin Other (See Comments)    Upset stomach  . Co Q 10 [Coenzyme Q10] Other (See Comments)    Reaction:  GI upset   . Crestor [Rosuvastatin Calcium] Other (See Comments)    Reaction:  Myalgias   . Dextromethorphan-Guaifenesin Other (See Comments)    Reaction:  Made pt jittery   . Epinephrine Hives  . Ramipril Other (See Comments)    Upset stomach  . Sulfamethoxazole-Trimethoprim Other (See Comments)    Upset stomach  . Vitamin D Analogs Other (See Comments)    Reaction:  GI upset     REVIEW OF SYSTEMS:  CONSTITUTIONAL: No fever, fatigue or weakness.  EYES: No blurred or double vision.  EARS, NOSE, AND THROAT: No tinnitus or ear pain.  RESPIRATORY: No cough, shortness of breath, wheezing or hemoptysis.  CARDIOVASCULAR: No chest pain, orthopnea, edema.  GASTROINTESTINAL: No nausea, vomiting, diarrhea or abdominal pain.  GENITOURINARY: No dysuria, hematuria.  ENDOCRINE: No polyuria, nocturia,  HEMATOLOGY: No anemia, easy bruising or bleeding SKIN: No rash or lesion. MUSCULOSKELETAL: No joint pain or arthritis.   NEUROLOGIC: No tingling, numbness, weakness.  PSYCHIATRY: No anxiety or depression.   MEDICATIONS AT HOME:   Prior to Admission medications   Medication Sig Start Date End Date Taking? Authorizing Provider  APRISO 0.375 g 24 hr capsule Take 3 capsules by mouth daily. 04/30/18 05/10/18 Yes [provider]  aspirin EC 81 MG tablet Take by mouth.   Yes [provider]  atorvastatin (LIPITOR) 10 MG tablet TAKE 1 TABLET(10 MG) BY MOUTH DAILY AT 6 PM 04/22/18  Yes [name], [name], MD  Calcium  Carbonate-Vitamin D (CALCIUM 600+D PO) Take 1 tablet by mouth daily.    Yes [provider]  chlorthalidone (HYGROTON) 25 MG tablet Take 12.5 mg by mouth 3 (three) times a week. Take 1/2 tablet every two days.    Yes [provider]  cyanocobalamin (,VITAMIN B-12,) 1000 MCG/ML injection Inject 1,000 mcg into the muscle every 30 (thirty) days.  12/16/16  Yes [provider]  irbesartan (AVAPRO) 300 MG tablet Take 1 tablet (300 mg total) by mouth daily. 05/13/17  Yes Theora Gianotti, NP  acetaminophen (TYLENOL) 500 MG tablet Take 500 mg by mouth every 6 (six) hours as needed.  [provider]  clobetasol ointment (TEMOVATE) 5.73 % Apply 1 application topically 2 (two) times daily. To affected area 04/22/18   [name], [name], MD  clorazepate (TRANXENE) 7.5 MG tablet Take 1 tablet (7.5 mg total) by mouth daily as needed. 03/14/18   [name], [name], MD  hydrocortisone (ANUSOL-HC) 2.5 % rectal cream Place 1 application rectally 2 (two) times daily as needed for hemorrhoids.  03/02/18   [provider]  omeprazole (PRILOSEC) 20 MG capsule TAKE 1 CAPSULE(20 MG) BY MOUTH DAILY AS NEEDED FOR HEARTBURN 04/22/18   [name], [place] A, MD      VITAL SIGNS:  Blood pressure (!) 164/72, pulse 67, temperature 97.8 F (36.6 C), temperature source Oral, resp. rate 18, height 5' 4.5" (1.638 m), weight 88 kg (194 lb), SpO2 97 %.  PHYSICAL EXAMINATION:  GENERAL:  83 y.o.-year-old patient lying in the bed with no acute distress.  EYES: Pupils equal, round, reactive to light and accommodation. No scleral icterus. Extraocular muscles intact.  HEENT: Head atraumatic, normocephalic. Oropharynx and nasopharynx clear.  NECK:  Supple, no jugular venous distention. No thyroid enlargement, no tenderness.  LUNGS: Normal breath sounds bilaterally, no wheezing, rales,rhonchi or crepitation. No use of accessory muscles of respiration.  CARDIOVASCULAR: S1, S2 normal. No murmurs, rubs, or  gallops.  ABDOMEN: Soft, nontender, nondistended. Bowel sounds present. No organomegaly or mass.  EXTREMITIES: No pedal edema, cyanosis, or clubbing.  NEUROLOGIC: Cranial nerves II through XII are intact. Muscle strength 5/5 in all extremities. Sensation intact. Gait not checked.  PSYCHIATRIC: The patient is alert and oriented x 3.  SKIN: No obvious rash, lesion, or ulcer.   LABORATORY PANEL:   CBC Recent Labs  Lab 05/09/18 1434  WBC 8.3  HGB 13.5  HCT 39.3  PLT 301   ------------------------------------------------------------------------------------------------------------------  Chemistries  Recent Labs  Lab 05/09/18 1434  NA 138  K 3.9  CL 103  CO2 25  GLUCOSE 104*  BUN 20  CREATININE 0.74  CALCIUM 9.7  AST 19  ALT 13*  ALKPHOS 64  BILITOT 0.6   ------------------------------------------------------------------------------------------------------------------  Cardiac Enzymes Recent Labs  Lab 05/09/18 1434  TROPONINI <0.03   ------------------------------------------------------------------------------------------------------------------  RADIOLOGY:  Ct Head Wo Contrast  Result Date: 05/09/2018 CLINICAL DATA:  Altered mental status. EXAM: CT HEAD WITHOUT CONTRAST TECHNIQUE: Contiguous axial images were obtained from the base of the skull through the vertex without intravenous contrast. COMPARISON:  MR brain dated November 28, 2010. FINDINGS: Brain: No evidence of acute infarction, hemorrhage, hydrocephalus, extra-axial collection or mass lesion/mass effect. Stable mild age related cerebral atrophy. Vascular: No hyperdense vessel or unexpected calcification. Skull: Negative for fracture or focal lesion. Sinuses/Orbits: No acute finding. Other: None. IMPRESSION: 1. Normal for age noncontrast head CT. Electronically Signed   By: Titus Dubin M.D.   On: 05/09/2018 14:46    EKG:   Orders placed or performed during the hospital encounter of 05/09/18  . ED EKG  .  ED EKG    IMPRESSION AND PLAN:  Larue Drawdy  is a 83 y.o. female with a known history of essential hypertension, B12 deficiency, hyperlipidemia has experienced altered mental status buttocks of approximately 6 hours prior to her arrival to emergency department.  Patient felt confused and forgetful with blurry vision for approximately 3 hours which is completely resolved at this time.  Denies any dysarthria or swallowing difficulties   # TIA Admit to MedSurg unit monitor patient on telemetry Symptoms are completely resolved at this time.   Patient has  passed bedside swallow evaluation CT head is negative, will get complete stroke work-up with MRI/MRA of the brain carotid Dopplers and 2D echocardiogram EEG ordered Neurology consult, neurochecks PT, OT evaluation Fasting lipid panel and hemoglobin A1c  #Abnormal urinalysis with possible UTI Urine culture and sensitivity and IV Rocephin  #Hyperlipidemia-check fasting lipid panel  #Hypertension Allow permissive hypertension and hold chlorthalidone, Avapro dose reduced from 300 mg to 75 mg to allow permissive hypertension  #History of cardiac murmur -outpatient follow-up with Dr. Fletcher Anon as recommended     All the records are reviewed and case discussed with ED provider. Management plans discussed with the patient, family and they are in agreement.  CODE STATUS: dnr  TOTAL TIME TAKING CARE OF THIS PATIENT: 41 minutes.   Note: This dictation was prepared with Dragon dictation along with smaller phrase technology. Any transcriptional errors that result from this process are unintentional.  Nicholes Mango M.D on 05/09/2018 at 7:03 PM  Between 7am to 6pm - Pager - [phone]  After 6pm go to [email or web address] - password [email or web address] [facility]  [facility]  Office  [phone]  CC: Primary care physician; [name], [name], MD

## 2018-05-09 NOTE — ED Provider Notes (Signed)
[facility] Emergency Department Provider Note ____________________________________________   First MD Initiated Contact with Patient 05/09/18 1527     (approximate)  I have reviewed the triage vital signs and the nursing notes.   HISTORY  Chief Complaint Altered Mental Status and Headache    HPI Tammy Manning is a 83 y.o. female with PMH as noted below who presents with an episode of altered mental status, acute onset approximately 6 hours ago, lasting for a few hours, and now resolved.  The patient states that she felt confused, and also felt a headache and was nauseous.  She called her husband who she had not realized that left her house.  Per the husband and patient's other family members she appeared confused when they spoke to her.  The patient now has returned pretty much to her baseline.  The family states that she no longer appears confused.  She states that she feels normal, however she is mostly amnestic to the few hours in question.  She and the family deny prior episodes like this.  The patient states she is otherwise been in her usual state of health except she has been treated for diverticulitis and is finishing a course of anti-inflammatory medication.   Past Medical History:  Diagnosis Date  . Anginal pain ([place])   . Arthritis    Knees  . B12 deficiency    Mild  . Bucket-handle tear of lateral meniscus of left knee as current injury 12/30/2012  . Cancer (HCC)    Skin, basal cell on nose and eyelid  . DJD (degenerative joint disease)    Low back  . Dyspepsia   . GERD (gastroesophageal reflux disease)   . Heart murmur   . Heart palpitations   . HOH (hard of hearing)   . Hyperlipidemia   . Hypertension   . Lactose intolerance   . Mixed incontinence   . PONV (postoperative nausea and vomiting)   . Vulvar irritation    from urine pad, uses Triamcinolone oint.    Patient Active Problem List   Diagnosis Date Noted  . Angina, class II  ([place]) 09/10/2015  . Aortic insufficiency 09/10/2015  . Stress reaction 09/08/2015  . Encounter for Medicare annual wellness exam 02/28/2013  . Bucket-handle tear of lateral meniscus of left knee as current injury 12/30/2012  . Episodic atrial fibrillation ([place]) 11/17/2012  . Special screening for malignant neoplasms, colon 01/21/2012  . Hearing loss of both ears 01/14/2012  . Adverse drug reaction 03/12/2011  . HEADACHE, CHRONIC 09/24/2010  . B12 deficiency 03/17/2010  . DYSPEPSIA 03/17/2010  . POSTMENOPAUSAL STATUS 08/20/2008  . Essential hypertension 07/19/2007  . Hyperlipidemia 06/24/2007  . CONSTIPATION 06/24/2007  . IBS 06/24/2007  . DERMATITIS, ATOPIC 06/24/2007  . SKIN CANCER, HX OF 06/24/2007    Past Surgical History:  Procedure Laterality Date  . ABDOMINAL AORTOGRAM N/A 03/15/2017   Procedure: Abdominal Aortogram;  Surgeon: Wellington Hampshire, MD;  Location: [facility];  Service: Cardiovascular;  Laterality: N/A;  . ABDOMINAL HYSTERECTOMY  1982   Large fibroids and bleeding  . APPENDECTOMY    . BILATERAL SALPINGOOPHORECTOMY  1986  . BONE GRAFT HIP ILIAC CREST     To Left arm  . BREAST REDUCTION SURGERY    . BREAST SURGERY    . CATARACT EXTRACTION W/PHACO Left 07/07/2016   Procedure: CATARACT EXTRACTION PHACO AND INTRAOCULAR LENS PLACEMENT (IOC);  Surgeon: Birder Robson, MD;  Location: [facility];  Service: Ophthalmology;  Laterality: Left;  [place] 01:10 AP% 18.3 CDE 12.91 Fluid pak lot # [id number]  . CATARACT EXTRACTION W/PHACO Right 07/28/2016   Procedure: CATARACT EXTRACTION PHACO AND INTRAOCULAR LENS PLACEMENT ([place]);  Surgeon: Birder Robson, MD;  Location: [facility];  Service: Ophthalmology;  Laterality: Right;  [place] 01:26 AP% 18.5 CDE 16.12 Fluid pack lot # [id number]  . COLONOSCOPY  07/2016   Dr. Thana Farr  . DILATION AND CURETTAGE OF UTERUS     miscarragesx2  . ESOPHAGOGASTRODUODENOSCOPY     Polyps  . [place]  2006  . KNEE ARTHROSCOPY WITH  LATERAL MENISECTOMY Left 12/30/2012   Procedure: KNEE ARTHROSCOPY WITH LATERAL MENISECTOMY;  Surgeon: Johnny Bridge, MD;  Location: [facility];  Service: Orthopedics;  Laterality: Left;  LEFT KNEE SCOPE LATERAL MENISCECTOMY  . LEFT HEART CATH AND CORONARY ANGIOGRAPHY N/A 03/15/2017   Procedure: Left Heart Cath and Coronary Angiography;  Surgeon: Wellington Hampshire, MD;  Location: [facility];  Service: Cardiovascular;  Laterality: N/A;  . MOUTH SURGERY    . skin cancer eyelid  2012  . TONSILLECTOMY      Prior to Admission medications   Medication Sig Start Date End Date Taking? Authorizing Provider  APRISO 0.375 g 24 hr capsule Take 3 capsules by mouth daily. 04/30/18 05/10/18 Yes [provider]  aspirin EC 81 MG tablet Take by mouth.   Yes [provider]  atorvastatin (LIPITOR) 10 MG tablet TAKE 1 TABLET(10 MG) BY MOUTH DAILY AT 6 PM 04/22/18  Yes [name], [name], MD  Calcium Carbonate-Vitamin D (CALCIUM 600+D PO) Take 1 tablet by mouth daily.    Yes [provider]  chlorthalidone (HYGROTON) 25 MG tablet Take 12.5 mg by mouth 3 (three) times a week. Take 1/2 tablet every two days.    Yes [provider]  cyanocobalamin (,VITAMIN B-12,) 1000 MCG/ML injection Inject 1,000 mcg into the muscle every 30 (thirty) days.  12/16/16  Yes [provider]  irbesartan (AVAPRO) 300 MG tablet Take 1 tablet (300 mg total) by mouth daily. 05/13/17  Yes Theora Gianotti, NP  acetaminophen (TYLENOL) 500 MG tablet Take 500 mg by mouth every 6 (six) hours as needed.    [provider]  clobetasol ointment (TEMOVATE) 5.78 % Apply 1 application topically 2 (two) times daily. To affected area 04/22/18   [name], [name], MD  clorazepate (TRANXENE) 7.5 MG tablet Take 1 tablet (7.5 mg total) by mouth daily as needed. 03/14/18   [name], [name], MD  hydrocortisone (ANUSOL-HC) 2.5 % rectal cream Place 1 application rectally 2 (two) times daily as  needed for hemorrhoids.  03/02/18   [provider]  omeprazole (PRILOSEC) 20 MG capsule TAKE 1 CAPSULE(20 MG) BY MOUTH DAILY AS NEEDED FOR HEARTBURN 04/22/18   [name], [name], MD    Allergies Amlodipine besylate; Antihistamines, chlorpheniramine-type; Atenolol; Cefuroxime axetil; Chlorthalidone; Ciprofloxacin; Co q 10 [coenzyme q10]; Crestor [rosuvastatin calcium]; Dextromethorphan-guaifenesin; Epinephrine; Ramipril; Sulfamethoxazole-trimethoprim; and Vitamin d analogs  Family History  Problem Relation Age of Onset  . Cancer Mother        Colon  . Heart disease Father        CAD  . Diabetes Sister   . Diabetes Brother   . Leukemia Brother   . Cancer Paternal Aunt        Breast  . Diabetes Sister     Social History Social History   Tobacco Use  . Smoking status: Former Smoker    Packs/day: 1.00  Years: 12.00    Pack years: 12.00    Types: Cigarettes    Last attempt to quit: 11/24/1963    Years since quitting: 54.4  . Smokeless tobacco: Never Used  Substance Use Topics  . Alcohol use: No    Alcohol/week: 0.0 oz    Comment: Rare  . Drug use: No    Review of Systems  Constitutional: No fever/chills Eyes: No visual changes. ENT: No sore throat. Cardiovascular: Denies chest pain. Respiratory: Denies shortness of breath. Gastrointestinal: No vomiting or diarrhea.  Genitourinary: Negative for dysuria.  Musculoskeletal: Negative for back pain. Skin: Negative for rash. Neurological: Positive for frontal headache, negative for focal weakness or numbness.   ____________________________________________   PHYSICAL EXAM:  VITAL SIGNS: ED Triage Vitals [05/09/18 1415]  Enc Vitals Group     BP (!) 181/93     Pulse Rate 68     Resp 18     Temp 98.3 F (36.8 C)     Temp Source Oral     SpO2 99 %     Weight 195 lb (88.5 kg)     Height 5' 4"  (1.626 m)     Head Circumference      Peak Flow      Pain Score 4     Pain Loc      Pain Edu?      Excl. in [place]?       Constitutional: Alert and oriented. Relatively well appearing and in no acute distress. Eyes: Conjunctivae are normal.  EOMI.  PERRLA. Head: Atraumatic. Nose: No congestion/rhinnorhea. Mouth/Throat: Mucous membranes are moist.   Neck: Normal range of motion.  Cardiovascular: Normal rate, regular rhythm. Grossly normal heart sounds.  Good peripheral circulation. Respiratory: Normal respiratory effort.  No retractions. Lungs CTAB. Gastrointestinal: Soft and nontender. No distention.  Genitourinary: No CVA tenderness. Musculoskeletal: No lower extremity edema.  Extremities warm and well perfused.  Neurologic:  Normal speech and language.  Motor and sensory intact in all extremities.  Normal coordination.  No gross focal neurologic deficits are appreciated.  Skin:  Skin is warm and dry. No rash noted. Psychiatric: Mood and affect are normal. Speech and behavior are normal.  ____________________________________________   LABS (all labs ordered are listed, but only abnormal results are displayed)  Labs Reviewed  COMPREHENSIVE METABOLIC PANEL - Abnormal; Notable for the following components:      Result Value   Glucose, Bld 104 (*)    ALT 13 (*)    All other components within normal limits  URINALYSIS, COMPLETE (UACMP) WITH MICROSCOPIC - Abnormal; Notable for the following components:   Color, Urine STRAW (*)    APPearance CLEAR (*)    Specific Gravity, Urine 1.003 (*)    Hgb urine dipstick SMALL (*)    Leukocytes, UA TRACE (*)    Bacteria, UA MANY (*)    All other components within normal limits  PROTIME-INR  APTT  CBC  DIFFERENTIAL  TROPONIN I  CBG MONITORING, ED   ____________________________________________  EKG  ED ECG REPORT I, Arta Silence, the attending physician, personally viewed and interpreted this ECG.  Date: 05/09/2018 EKG Time: 1423 Rate: 66 Rhythm: normal sinus rhythm QRS Axis: normal Intervals: normal ST/T Wave abnormalities:  normal Narrative Interpretation: no evidence of acute ischemia  ____________________________________________  RADIOLOGY  CT head: No ICH or other acute findings  ____________________________________________   PROCEDURES  Procedure(s) performed: No  Procedures  Critical Care performed: No ____________________________________________   INITIAL IMPRESSION / ASSESSMENT AND PLAN /  ED COURSE  Pertinent labs & imaging results that were available during my care of the patient were reviewed by me and considered in my medical decision making (see chart for details).  83 year old female with PMH as noted above presents with an episode of altered mental status lasting a few hours, and she has now returned to her baseline.  She is mostly amnestic to that period.  No prior history of episodes like this.  On exam, the vital signs are normal except for hypertension.  The patient is relatively comfortable appearing.  She is alert and oriented x4, and has a nonfocal neurologic exam.  Initial lab work-up and CT head are negative.  Differential includes transient global amnesia, TIA, seizure, UTI or less likely other source of infection, or metabolic etiology.  I consulted Dr. Doy Mince from neurology.  She recommends that the patient be admitted for MRI and EEG.  ----------------------------------------- 5:21 PM on 05/09/2018 -----------------------------------------  UA does show findings consistent with possible UTI.  We will admit per neurology recommendations.  I signed the patient out to the hospitalist Dr. Margaretmary Eddy.   ____________________________________________   FINAL CLINICAL IMPRESSION(S) / ED DIAGNOSES  Final diagnoses:  Altered mental status, unspecified altered mental status type  Amnesia      NEW MEDICATIONS STARTED DURING THIS VISIT:  New Prescriptions   No medications on file     Note:  This document was prepared using Dragon voice recognition software and may  include unintentional dictation errors.    Arta Silence, MD 05/09/18 [phone]

## 2018-05-09 NOTE — Progress Notes (Signed)
Family Meeting Note  Advance Directive:yes  Today a meeting took place with the Patient, spouse and son at bedside    The following clinical team members were present during this meeting:MD  The following were discussed:Patient's diagnosis: TIA, altered mental status, amnesia, possible urinary tract infection, treatment plan of care discussed in detail with the patient and family members at bedside.  Other comorbidities as documented below are also discussed in detail,  Anginal pain ([place])   . Arthritis    Knees  . B12 deficiency    Mild  . Bucket-handle tear of lateral meniscus of left knee as current injury 12/30/2012  . Cancer (HCC)    Skin, basal cell on nose and eyelid  . DJD (degenerative joint disease)    Low back  . Dyspepsia   . GERD (gastroesophageal reflux disease)   . Heart murmur   . Heart palpitations   . HOH (hard of hearing)   . Hyperlipidemia   . Hypertension   . Lactose intolerance   . Mixed incontinence   . PONV (postoperative nausea and vomiting)   . Vulvar irritation    from urine pad, uses Triamcinolone oint.         Patient's progosis: Unable to determine and Goals for treatment: DNR  Husband Karle Starch and son Kasandra Knudsen is a healthcare POA  Additional follow-up to be provided: Hospitalist and neurologist  Time spent during discussion:17 min  Nicholes Mango, MD

## 2018-05-09 NOTE — ED Notes (Signed)
Pt requested water and snack, RN gave pt water and crackers.

## 2018-05-09 NOTE — ED Triage Notes (Signed)
Family noted approx 45 min ago patient seeming confused and forgetful. Patient states some time this am she developed a headache. Smile symmetrical, grips and leg strength. Family last saw her normal at [facility].

## 2018-05-09 NOTE — ED Notes (Addendum)
Pt passed swallow screen with no difficulties. Admitting aware.

## 2018-05-09 NOTE — Progress Notes (Signed)
Contacted hospitalist r/t patient MRI results. MD discontinued stroke related orders.

## 2018-05-10 ENCOUNTER — Observation Stay: Payer: Medicare Other

## 2018-05-10 ENCOUNTER — Observation Stay (HOSPITAL_BASED_OUTPATIENT_CLINIC_OR_DEPARTMENT_OTHER)
Admit: 2018-05-10 | Discharge: 2018-05-10 | Disposition: A | Discharge status: Home / Self Care | Payer: Medicare Other | Attending: Internal Medicine | Admitting: Internal Medicine

## 2018-05-10 DIAGNOSIS — N390 Urinary tract infection, site not specified: Secondary | ICD-10-CM | POA: Diagnosis not present

## 2018-05-10 DIAGNOSIS — I6523 Occlusion and stenosis of bilateral carotid arteries: Secondary | ICD-10-CM | POA: Diagnosis not present

## 2018-05-10 DIAGNOSIS — G454 Transient global amnesia: Secondary | ICD-10-CM | POA: Diagnosis not present

## 2018-05-10 DIAGNOSIS — I10 Essential (primary) hypertension: Secondary | ICD-10-CM | POA: Diagnosis not present

## 2018-05-10 DIAGNOSIS — R413 Other amnesia: Secondary | ICD-10-CM

## 2018-05-10 DIAGNOSIS — G459 Transient cerebral ischemic attack, unspecified: Secondary | ICD-10-CM | POA: Diagnosis not present

## 2018-05-10 DIAGNOSIS — I361 Nonrheumatic tricuspid (valve) insufficiency: Secondary | ICD-10-CM | POA: Diagnosis not present

## 2018-05-10 DIAGNOSIS — E785 Hyperlipidemia, unspecified: Secondary | ICD-10-CM | POA: Diagnosis not present

## 2018-05-10 LAB — LIPID PANEL
Cholesterol: 174 mg/dL (ref 0–200)
HDL: 57 mg/dL (ref >40)
LDL Cholesterol: 93 mg/dL (ref 0–99)
Total CHOL/HDL Ratio: 3.1 RATIO
Triglycerides: 118 mg/dL (ref <150)
VLDL: 24 mg/dL (ref 0–40)

## 2018-05-10 LAB — ECHOCARDIOGRAM COMPLETE
Height: 64.5 in
Weight: 3104 oz

## 2018-05-10 LAB — HEMOGLOBIN A1C
Hgb A1c MFr Bld: 5.7 % — ABNORMAL HIGH (ref 4.8–5.6)
Mean Plasma Glucose: 116.89 mg/dL

## 2018-05-10 MED ORDER — ASPIRIN EC 81 MG PO TBEC: 81.0000 mg | DELAYED_RELEASE_TABLET | Freq: Every day | ORAL | 2 refills | Status: DC

## 2018-05-10 MED ORDER — CEPHALEXIN 250 MG PO CAPS: 250.0000 mg | ORAL_CAPSULE | Freq: Three times a day (TID) | ORAL | 0 refills | Status: AC

## 2018-05-10 NOTE — Progress Notes (Signed)
Received discharge order. IV removed. Discharge paperwork provided, explained, signed and witnessed. No unanswered questions. Discharged via wheelchair by auxiliary services. Belongings sent with patient and family.

## 2018-05-10 NOTE — Consult Note (Signed)
Reason for Consult: Amnesia Referring Physician: Tressia Miners  CC: Amnesia  HPI: Tammy Manning is an 83 y.o. female who reports remembering awakening at baseline on yesterday and starting her day.  Had her husband go to the store for her and when he returned she was calling him to tell him that something was wrong.  When he got back into the house she was unable to recall what she had dome that morning and was confused.  She was convinced to come to the hospital and was admitted for further evaluation.    Past Medical History:  Diagnosis Date  . Anginal pain ([place])   . Arthritis    Knees  . B12 deficiency    Mild  . Bucket-handle tear of lateral meniscus of left knee as current injury 12/30/2012  . Cancer (HCC)    Skin, basal cell on nose and eyelid  . DJD (degenerative joint disease)    Low back  . Dyspepsia   . GERD (gastroesophageal reflux disease)   . Heart murmur   . Heart palpitations   . HOH (hard of hearing)   . Hyperlipidemia   . Hypertension   . Lactose intolerance   . Mixed incontinence   . PONV (postoperative nausea and vomiting)   . Vulvar irritation    from urine pad, uses Triamcinolone oint.    Past Surgical History:  Procedure Laterality Date  . ABDOMINAL AORTOGRAM N/A 03/15/2017   Procedure: Abdominal Aortogram;  Surgeon: Wellington Hampshire, MD;  Location: [facility];  Service: Cardiovascular;  Laterality: N/A;  . ABDOMINAL HYSTERECTOMY  1982   Large fibroids and bleeding  . APPENDECTOMY    . BILATERAL SALPINGOOPHORECTOMY  1986  . BONE GRAFT HIP ILIAC CREST     To Left arm  . BREAST REDUCTION SURGERY    . BREAST SURGERY    . CATARACT EXTRACTION W/PHACO Left 07/07/2016   Procedure: CATARACT EXTRACTION PHACO AND INTRAOCULAR LENS PLACEMENT (I[facility]);  Surgeon: Birder Robson, MD;  Location: [facility];  Service: Ophthalmology;  Laterality: Left;  [place] 01:10 AP% 18.3 CDE 12.91 Fluid pak lot # [id number]  . CATARACT EXTRACTION W/PHACO Right 07/28/2016    Procedure: CATARACT EXTRACTION PHACO AND INTRAOCULAR LENS PLACEMENT ([place]);  Surgeon: Birder Robson, MD;  Location: [facility];  Service: Ophthalmology;  Laterality: Right;  [place] 01:26 AP% 18.5 CDE 16.12 Fluid pack lot # [id number]  . COLONOSCOPY  07/2016   Dr. Thana Farr  . DILATION AND CURETTAGE OF UTERUS     miscarragesx2  . ESOPHAGOGASTRODUODENOSCOPY     Polyps  . [place]  2006  . KNEE ARTHROSCOPY WITH LATERAL MENISECTOMY Left 12/30/2012   Procedure: KNEE ARTHROSCOPY WITH LATERAL MENISECTOMY;  Surgeon: Johnny Bridge, MD;  Location: [facility];  Service: Orthopedics;  Laterality: Left;  LEFT KNEE SCOPE LATERAL MENISCECTOMY  . LEFT HEART CATH AND CORONARY ANGIOGRAPHY N/A 03/15/2017   Procedure: Left Heart Cath and Coronary Angiography;  Surgeon: Wellington Hampshire, MD;  Location: [facility];  Service: Cardiovascular;  Laterality: N/A;  . MOUTH SURGERY    . skin cancer eyelid  2012  . TONSILLECTOMY      Family History  Problem Relation Age of Onset  . Cancer Mother        Colon  . Heart disease Father        CAD  . Diabetes Sister   . Diabetes Brother   . Leukemia Brother   . Cancer Paternal Aunt  Breast  . Diabetes Sister     Social History:  reports that she quit smoking about 54 years ago. Her smoking use included cigarettes. She has a 12.00 pack-year smoking history. She has never used smokeless tobacco. She reports that she does not drink alcohol or use drugs.  Allergies  Allergen Reactions  . Amlodipine Besylate Other (See Comments)    Reaction:  Increases BP  . Antihistamines, Chlorpheniramine-Type Other (See Comments)    Reaction:  Makes pt hyper   . Atenolol Hypertension  . Cefuroxime Axetil Other (See Comments)    Upset stomach  . Chlorthalidone Other (See Comments)  . Ciprofloxacin Other (See Comments)    Upset stomach  . Co Q 10 [Coenzyme Q10] Other (See Comments)    Reaction:  GI upset   . Crestor [Rosuvastatin Calcium]  Other (See Comments)    Reaction:  Myalgias   . Dextromethorphan-Guaifenesin Other (See Comments)    Reaction:  Made pt jittery   . Epinephrine Hives  . Ramipril Other (See Comments)    Upset stomach  . Sulfamethoxazole-Trimethoprim Other (See Comments)    Upset stomach  . Vitamin D Analogs Other (See Comments)    Reaction:  GI upset     Medications:  I have reviewed the patient's current medications. Prior to Admission:  Medications Prior to Admission  Medication Sig Dispense Refill Last Dose  . APRISO 0.375 g 24 hr capsule Take 3 capsules by mouth daily.  0 05/09/2018 at Unknown time  . aspirin EC 81 MG tablet Take by mouth.   05/09/2018 at Unknown time  . atorvastatin (LIPITOR) 10 MG tablet TAKE 1 TABLET(10 MG) BY MOUTH DAILY AT 6 PM 90 tablet 3 05/08/2018 at Unknown time  . Calcium Carbonate-Vitamin D (CALCIUM 600+D PO) Take 1 tablet by mouth daily.    05/09/2018 at Unknown time  . chlorthalidone (HYGROTON) 25 MG tablet Take 12.5 mg by mouth 3 (three) times a week. Take 1/2 tablet every two days.    Past Week at Unknown time  . cyanocobalamin (,VITAMIN B-12,) 1000 MCG/ML injection Inject 1,000 mcg into the muscle every 30 (thirty) days.    Past Month at Unknown time  . irbesartan (AVAPRO) 300 MG tablet Take 1 tablet (300 mg total) by mouth daily. 90 tablet 3 05/09/2018 at Unknown time  . acetaminophen (TYLENOL) 500 MG tablet Take 500 mg by mouth every 6 (six) hours as needed.   prn at prn  . clobetasol ointment (TEMOVATE) 1.61 % Apply 1 application topically 2 (two) times daily. To affected area 30 g 1 prn at prn  . clorazepate (TRANXENE) 7.5 MG tablet Take 1 tablet (7.5 mg total) by mouth daily as needed. 30 tablet 0 prn at prn  . hydrocortisone (ANUSOL-HC) 2.5 % rectal cream Place 1 application rectally 2 (two) times daily as needed for hemorrhoids.    prn at prn  . omeprazole (PRILOSEC) 20 MG capsule TAKE 1 CAPSULE(20 MG) BY MOUTH DAILY AS NEEDED FOR HEARTBURN 90 capsule 3 prn at prn    Scheduled: . aspirin EC  81 mg Oral Daily  . atorvastatin  10 mg Oral q1800  . calcium-vitamin D   Oral Daily  . cyanocobalamin  1,000 mcg Intramuscular Q30 days  . enoxaparin (LOVENOX) injection  40 mg Subcutaneous Q24H  . irbesartan  75 mg Oral Daily  . mesalamine  1.2 g Oral Q breakfast  . pantoprazole  40 mg Oral Daily    ROS: History obtained from the patient  General ROS: negative for - chills, fatigue, fever, night sweats, weight gain or weight loss Psychological ROS: as noted in HPI Ophthalmic ROS: negative for - blurry vision, double vision, eye pain or loss of vision ENT ROS: negative for - epistaxis, nasal discharge, oral lesions, sore throat, tinnitus or vertigo Allergy and Immunology ROS: negative for - hives or itchy/watery eyes Hematological and Lymphatic ROS: negative for - bleeding problems, bruising or swollen lymph nodes Endocrine ROS: negative for - galactorrhea, hair pattern changes, polydipsia/polyuria or temperature intolerance Respiratory ROS: negative for - cough, hemoptysis, shortness of breath or wheezing Cardiovascular ROS: negative for - chest pain, dyspnea on exertion, edema or irregular heartbeat Gastrointestinal ROS: negative for - abdominal pain, diarrhea, hematemesis, nausea/vomiting or stool incontinence Genito-Urinary ROS: negative for - dysuria, hematuria, incontinence or urinary frequency/urgency Musculoskeletal ROS: negative for - joint swelling or muscular weakness Neurological ROS: as noted in HPI Dermatological ROS: negative for rash and skin lesion changes  Physical Examination: Blood pressure 124/69, pulse 69, temperature 98.1 F (36.7 C), temperature source Oral, resp. rate 16, height 5' 4.5" (1.638 m), weight 88 kg (194 lb), SpO2 98 %.  HEENT-  Normocephalic, no lesions, without obvious abnormality.  Normal external eye and conjunctiva.  Normal TM's bilaterally.  Normal auditory canals and external ears. Normal external nose, mucus  membranes and septum.  Normal pharynx. Cardiovascular- S1, S2 normal, pulses palpable throughout   Lungs- chest clear, no wheezing, rales, normal symmetric air entry Abdomen- soft, non-tender; bowel sounds normal; no masses,  no organomegaly Extremities- no edema Lymph-no adenopathy palpable Musculoskeletal-no joint tenderness, deformity or swelling Skin-warm and dry, no hyperpigmentation, vitiligo, or suspicious lesions  Neurological Examination   Mental Status: Alert, oriented, thought content appropriate.  Speech fluent without evidence of aphasia.  Able to follow 3 step commands without difficulty. Cranial Nerves: II: Discs flat bilaterally; Visual fields grossly normal, pupils equal, round, reactive to light and accommodation III,IV, VI: ptosis not present, extra-ocular motions intact bilaterally V,VII: smile symmetric, facial light touch sensation normal bilaterally VIII: hearing normal bilaterally IX,X: gag reflex present XI: bilateral shoulder shrug XII: midline tongue extension Motor: Right : Upper extremity   5/5    Left:     Upper extremity   5/5  Lower extremity   5/5     Lower extremity   5/5 Tone and bulk:normal tone throughout; no atrophy noted Sensory: Pinprick and light touch intact throughout, bilaterally Deep Tendon Reflexes: 2+ and symmetric with absent AJ's bilaterally Plantars: Right: mute   Left: mute Cerebellar: Normal finger-to-nose and normal heel-to-shin testing bilaterally Gait: normal gait and station    Laboratory Studies:   Basic Metabolic Panel: Recent Labs  Lab 05/09/18 1434  NA 138  K 3.9  CL 103  CO2 25  GLUCOSE 104*  BUN 20  CREATININE 0.74  CALCIUM 9.7    Liver Function Tests: Recent Labs  Lab 05/09/18 1434  AST 19  ALT 13*  ALKPHOS 64  BILITOT 0.6  PROT 7.5  ALBUMIN 4.3   No results for input(s): LIPASE, AMYLASE in the last 168 hours. No results for input(s): AMMONIA in the last 168 hours.  CBC: Recent Labs  Lab  05/09/18 1434  WBC 8.3  NEUTROABS 5.8  HGB 13.5  HCT 39.3  MCV 92.7  PLT 301    Cardiac Enzymes: Recent Labs  Lab 05/09/18 1434  TROPONINI <0.03    BNP: Invalid input(s): POCBNP  CBG: No results for input(s): GLUCAP in the last 168 hours.  Microbiology: Results for orders placed or performed in visit on 02/16/18  Urine Culture     Status: Abnormal   Collection Time: 02/16/18  4:22 PM  Result Value Ref Range Status   MICRO NUMBER: [id number]  Final   SPECIMEN QUALITY: ADEQUATE  Final   Sample Source URINE  Final   STATUS: FINAL  Final   ISOLATE 1: Klebsiella pneumoniae (A)  Final    Comment: Greater than 100,000 CFU/mL of Klebsiella pneumoniae      Susceptibility   Klebsiella pneumoniae - URINE CULTURE, REFLEX    AMOX/CLAVULANIC <=2 Sensitive     AMPICILLIN  Resistant     AMPICILLIN/SULBACTAM 4 Sensitive     CEFAZOLIN* <=4 Not Reportable      * For infections other than uncomplicated UTIcaused by E. coli, K. pneumoniae or P. mirabilis:Cefazolin is resistant if MIC > or = 8 mcg/mL.(Distinguishing susceptible versus intermediatefor isolates with MIC < or = 4 mcg/mL requiresadditional testing.)For uncomplicated UTI caused by E. coli,K. pneumoniae or P. mirabilis: Cefazolin issusceptible if MIC <32 mcg/mL and predictssusceptible to the oral agents cefaclor, cefdinir,cefpodoxime, cefprozil, cefuroxime, cephalexinand loracarbef.    CEFEPIME <=1 Sensitive     CEFTRIAXONE <=1 Sensitive     CIPROFLOXACIN <=0.25 Sensitive     LEVOFLOXACIN <=0.12 Sensitive     ERTAPENEM <=0.5 Sensitive     GENTAMICIN <=1 Sensitive     IMIPENEM <=0.25 Sensitive     NITROFURANTOIN <=16 Sensitive     PIP/TAZO <=4 Sensitive     TOBRAMYCIN <=1 Sensitive     TRIMETH/SULFA* <=20 Sensitive      * For infections other than uncomplicated UTIcaused by E. coli, K. pneumoniae or P. mirabilis:Cefazolin is resistant if MIC > or = 8 mcg/mL.(Distinguishing susceptible versus intermediatefor isolates with MIC <  or = 4 mcg/mL requiresadditional testing.)For uncomplicated UTI caused by E. coli,K. pneumoniae or P. mirabilis: Cefazolin issusceptible if MIC <32 mcg/mL and predictssusceptible to the oral agents cefaclor, cefdinir,cefpodoxime, cefprozil, cefuroxime, cephalexinand loracarbef.Legend:S = Susceptible  I = IntermediateR = Resistant  NS = Not susceptible* = Not tested  NR = Not reported**NN = See antimicrobic comments    Coagulation Studies: Recent Labs    05/09/18 1434  LABPROT 12.5  INR 0.94    Urinalysis:  Recent Labs  Lab 05/09/18 1639  COLORURINE STRAW*  LABSPEC 1.003*  PHURINE 6.0  GLUCOSEU NEGATIVE  HGBUR SMALL*  BILIRUBINUR NEGATIVE  KETONESUR NEGATIVE  PROTEINUR NEGATIVE  NITRITE NEGATIVE  LEUKOCYTESUR TRACE*    Lipid Panel:     Component Value Date/Time   CHOL 174 05/10/2018 0511   TRIG 118 05/10/2018 0511   HDL 57 05/10/2018 0511   CHOLHDL 3.1 05/10/2018 0511   VLDL 24 05/10/2018 0511   LDLCALC 93 05/10/2018 0511    HgbA1C:  Lab Results  Component Value Date   HGBA1C 5.7 (H) 05/10/2018    Urine Drug Screen:  No results found for: LABOPIA, COCAINSCRNUR, LABBENZ, AMPHETMU, THCU, LABBARB  Alcohol Level: No results for input(s): ETH in the last 168 hours.   Imaging: Ct Head Wo Contrast  Result Date: 05/09/2018 CLINICAL DATA:  Altered mental status. EXAM: CT HEAD WITHOUT CONTRAST TECHNIQUE: Contiguous axial images were obtained from the base of the skull through the vertex without intravenous contrast. COMPARISON:  MR brain dated November 28, 2010. FINDINGS: Brain: No evidence of acute infarction, hemorrhage, hydrocephalus, extra-axial collection or mass lesion/mass effect. Stable mild age related cerebral atrophy. Vascular: No hyperdense vessel or unexpected calcification. Skull: Negative for fracture or  focal lesion. Sinuses/Orbits: No acute finding. Other: None. IMPRESSION: 1. Normal for age noncontrast head CT. Electronically Signed   By: Titus Dubin M.D.    On: 05/09/2018 14:46   Mr Brain Wo Contrast  Result Date: 05/09/2018 CLINICAL DATA:  Initial evaluation for acute confusion, blurry vision, now resolved. EXAM: MRI HEAD WITHOUT CONTRAST TECHNIQUE: Multiplanar, multiecho pulse sequences of the brain and surrounding structures were obtained without intravenous contrast. COMPARISON:  Prior CT from earlier the same day. FINDINGS: Brain: Generalized age appropriate cerebral atrophy. Patchy T2/FLAIR hyperintensity within the periventricular and deep white matter both cerebral hemispheres, most like related chronic small vessel ischemic change, felt to be within normal limits for age. No abnormal foci of restricted diffusion to suggest acute or subacute ischemia. Gray-white matter differentiation maintained. No encephalomalacia to suggest chronic infarction. No foci of susceptibility artifact to suggest acute or chronic intracranial hemorrhage. No mass lesion, midline shift or mass effect. No hydrocephalus. No extra-axial fluid collection. Normal pituitary gland. Vascular: Major intracranial vascular flow voids are well maintained. Skull and upper cervical spine: Craniocervical junction normal. Upper cervical spine within normal limits. Normal bone marrow signal intensity. Small lipoma noted at the right forehead. Scalp soft tissues demonstrate no acute abnormality. Sinuses/Orbits: Globes and orbital soft tissues demonstrate no acute finding. Mild scattered mucosal thickening throughout the paranasal sinuses. No significant mastoid effusion. Inner ear structures normal. Other: None. IMPRESSION: Normal brain MRI for age. No acute intracranial abnormality identified. Electronically Signed   By: Jeannine Boga M.D.   On: 05/09/2018 21:59     Assessment/Plan: 83 year old female presenting after an episode of amnesia.  Patient currently at baseline.  Work-up is included an MRI of the brain that is been reviewed and is unremarkable.  Neurological exam is nonfocal.   Blood work is unremarkable as well.  Episode likely represents trans-global amnesia.  Patient awaiting EEG.  Recommendations: 1.  If EEG unremarkable no further neurological work-up recommended at this time. 2.  Patient is to start aspirin 81 mg daily  3.  Patient follow-up with her primary care physician on outpatient basis.   Alexis Goodell, MD Neurology [phone] 05/10/2018, 2:10 PM

## 2018-05-10 NOTE — Care Management Note (Signed)
Case Management Note  Patient Details  Name: Tammy Manning MRN: [id number] Date of Birth: 12/26/1934  Subjective/Objective:                  Admitted to [facility] under observation status with the diagnosis of TIA. Lives with husband, Karle Starch [phone]). Last seen Dr. Glori Bickers 04/22/18. Prescriptions are filled at [facility]. No home health. No skilled facility. No home oxygen. Cane available, if needed. Takes care of all basic activities of daily living herself, drives. Walks 2 miles every day with her dogs. No falls. Fine appetite. Family will transport  Action/Plan: No follow-up needs identified at this time   Expected Discharge Date:                  Expected Discharge Plan:     In-House Referral:     Discharge planning Services     Post Acute Care Choice:    Choice offered to:     DME Arranged:    DME Agency:     HH Arranged:    [place] Agency:     Status of Service:     If discussed at [employer], dates discussed:    Additional Comments:  Shelbie Ammons, RN MSN CCM Care Management [phone] 05/10/2018, 8:27 AM

## 2018-05-10 NOTE — Progress Notes (Signed)
eeg completed ° °

## 2018-05-10 NOTE — Evaluation (Signed)
{  SLP Cancellation Note  Patient Details Name: Tammy Manning MRN: [id number] DOB: 05-16-1935   Cancelled treatment:       Reason Eval/Treat Not Completed: SLP screened, no needs identified, will sign off  Pt and NSG educated on ssx aspiration to monitor and to call slp if changes in condition occur.    [place] Sauber 05/10/2018, 11:00 AM

## 2018-05-10 NOTE — Evaluation (Signed)
Physical Therapy Evaluation Patient Details Name: Tammy Manning MRN: [id number] DOB: September 01, 1935 Today's Date: 05/10/2018   History of Present Illness  pt presents to hospital 05/09/18 after a period of AMS. AMS subsided by arrival at the hospital. Imaging including MRI and CT negative for acute findings. Pt has a history of arthritis, skin cancer, GERD, HTN, and incontinence.    Clinical Impression  Pt is a pleasant 83 year old female who was admitted for altered mental status. Pt performs bed mobility, transfers, and ambulation with supervision. Pt demonstrates that she is at her PLOF. Pt performs all mobility safely, demonstrates good balance and strength at this time. Pt amb 200' with close supervision and no gross unsteadiness or LOB. Full neurological screen including upper and lower strength, sensation, and coordination equal bilaterally and WNL. At this time pt appears to be at [facility] and further PT is not needed. D/c recommendations at this time is home with no further PT follow up. This entire session was guided, instructed, and directly supervised by Elizabeth Palau, DPT.      Follow Up Recommendations No PT follow up    Equipment Recommendations  None recommended by PT    Recommendations for Other Services       Precautions / Restrictions Precautions Precautions: None      Mobility  Bed Mobility Overal bed mobility: Independent             General bed mobility comments: pt independent supine<>sit safely  Transfers Overall transfer level: Independent Equipment used: None             General transfer comment: pt independent sit<>stand and performs so safely without any gross LOB  Ambulation/Gait Ambulation/Gait assistance: Independent;Supervision Gait Distance (Feet): 200 Feet Assistive device: None Gait Pattern/deviations: WFL(Within Functional Limits) Gait velocity: 10' in 6 sec   General Gait Details: pt amb 200' with supervision and without any gross  gait deviations. Maneuvers enviornment appropriately, no gross LOB.  Stairs            Wheelchair Mobility    Modified Rankin (Stroke Patients Only)       Balance Overall balance assessment: Independent(unsupported narrow BOS, with eyes closed no LOB)                                           Pertinent Vitals/Pain Pain Assessment: No/denies pain    Home Living Family/patient expects to be discharged to:: Private residence Living Arrangements: Spouse/significant other Available Help at Discharge: Family Type of Home: Other(Comment)(townhome)         Home Equipment: None      Prior Function Level of Independence: Independent         Comments: independent, walks dogs long distances at baseline     Hand Dominance        Extremity/Trunk Assessment   Upper Extremity Assessment Upper Extremity Assessment: RUE deficits/detail;LUE deficits/detail RUE Deficits / Details: grossly at least 4/5 RUE Sensation: WNL RUE Coordination: WNL LUE Deficits / Details: grossly at least 4/5 LUE Sensation: WNL LUE Coordination: WNL    Lower Extremity Assessment Lower Extremity Assessment: RLE deficits/detail;LLE deficits/detail RLE Deficits / Details: grossly at least 4/5; knee flexion 5/5, knee extension 5/5, DF 5/5 RLE Sensation: WNL RLE Coordination: WNL LLE Deficits / Details: grossly at least 4/5; knee flexion 5/5, knee extension 5/5, DF 5/5 LLE Sensation: WNL LLE Coordination: WNL  Communication   Communication: No difficulties  Cognition Arousal/Alertness: Awake/alert Behavior During Therapy: WFL for tasks assessed/performed Overall Cognitive Status: Within Functional Limits for tasks assessed                                        General Comments      Exercises     Assessment/Plan    PT Assessment Patent does not need any further PT services  PT Problem List         PT Treatment Interventions      PT  Goals (Current goals can be found in the Care Plan section)  Acute Rehab PT Goals Patient Stated Goal: to go home PT Goal Formulation: With patient Time For Goal Achievement: 05/24/18 Potential to Achieve Goals: Good    Frequency     Barriers to discharge        Co-evaluation               AM-PAC PT "6 Clicks" Daily Activity  Outcome Measure Difficulty turning over in bed (including adjusting bedclothes, sheets and blankets)?: None Difficulty moving from lying on back to sitting on the side of the bed? : None Difficulty sitting down on and standing up from a chair with arms (e.g., wheelchair, bedside commode, etc,.)?: None Help needed moving to and from a bed to chair (including a wheelchair)?: None Help needed walking in hospital room?: None Help needed climbing 3-5 steps with a railing? : None 6 Click Score: 24    End of Session Equipment Utilized During Treatment: Gait belt Activity Tolerance: Patient tolerated treatment well Patient left: in bed;with call bell/phone within reach;with bed alarm set;with family/visitor present Nurse Communication: Other (comment)(no further PT needs)      Time: 0950-1000 PT Time Calculation (min) (ACUTE ONLY): 10 min   Charges:         PT G Codes:        Rayen Dafoe, SPT   Salah Burlison 05/10/2018, 11:21 AM

## 2018-05-10 NOTE — Progress Notes (Signed)
*  PRELIMINARY RESULTS* Echocardiogram 2D Echocardiogram has been performed.  Tammy Manning 05/10/2018, 11:05 AM

## 2018-05-10 NOTE — Progress Notes (Signed)
OT Cancellation Note  Patient Details Name: Tammy Manning MRN: [id number] DOB: Jan 04, 1935   Cancelled Treatment:    Reason Eval/Treat Not Completed: Patient at procedure or test/ unavailable. Order received, chart reviewed. Pt out of room upon attempt. Will re-attempt OT evaluation at later date/time as pt is available and medically appropriate.  Jeni Salles, MPH, MS, OTR/L ascom [phone] 05/10/18, 11:59 AM

## 2018-05-10 NOTE — Procedures (Signed)
ELECTROENCEPHALOGRAM REPORT   Patient: Tammy Manning       Room #: 104A-AA EEG No. ID: [id number] Age: 83 y.o.        Sex: female Referring Physician: Tressia Miners Report Date:  05/10/2018        Interpreting Physician: Alexis Goodell  History: [name] is an 83 y.o. female with an episode of amnesia  Medications:  ASA, Lipitor, Oscal, Vitamin B12, Lialda, Protonix  Conditions of Recording:  This is a 16 channel EEG carried out with the patient in the awake and drowsy states.  Description:  The waking background activity consists of a low voltage, symmetrical, fairly well organized, 10 Hz alpha activity, seen from the parieto-occipital and posterior temporal regions.  Low voltage fast activity, poorly organized, is seen anteriorly and is at times superimposed on more posterior regions.  A mixture of theta and alpha rhythms are seen from the central and temporal regions. The patient drowses with slowing to irregular, low voltage theta and beta activity.   Stage II sleep is not obtained. No epileptiform activity is noted.   Hyperventilation was not performed.  Intermittent photic stimulation was performed but failed to illicit any change in the tracing.     IMPRESSION: Normal electroencephalogram, awake, drowsy and with activation procedures. There are no focal lateralizing or epileptiform features.   Alexis Goodell, MD Neurology [phone] 05/10/2018, 1:42 PM

## 2018-05-10 NOTE — Plan of Care (Signed)
  Problem: Education: Goal: Knowledge of General Education information will improve Outcome: Progressing   Problem: Health Behavior/Discharge Planning: Goal: Ability to manage health-related needs will improve Outcome: Progressing   Problem: Clinical Measurements: Goal: Ability to maintain clinical measurements within normal limits will improve Outcome: Progressing Goal: Cardiovascular complication will be avoided Outcome: Progressing   Problem: Nutrition: Goal: Adequate nutrition will be maintained Outcome: Progressing   Problem: Pain Managment: Goal: General experience of comfort will improve Outcome: Progressing   Problem: Safety: Goal: Ability to remain free from injury will improve Outcome: Progressing   Problem: Skin Integrity: Goal: Risk for impaired skin integrity will decrease Outcome: Progressing   Problem: Education: Goal: Knowledge of disease or condition will improve Outcome: Progressing Goal: Knowledge of secondary prevention will improve Outcome: Progressing Goal: Knowledge of patient specific risk factors addressed and post discharge goals established will improve Outcome: Progressing

## 2018-05-10 NOTE — Progress Notes (Signed)
OT Screen Note  Patient Details Name: Karalyn Kadel MRN: [id number] DOB: 08/28/1935   Cancelled Treatment:    Reason Eval/Treat Not Completed: OT screened, no needs identified, will sign off. Order received, chart reviewed. Spouse in room with pt upon therapist's entry. Pt pleasant, A&Ox4, reporting she feels back to baseline. Pt denies any deficits or symptoms. No visual, cognitive, coordination, strength, or balance deficits appreciated. No skilled OT needs identified. Will sign off. Please re-consult if additional needs arise.  Jeni Salles, MPH, MS, OTR/L ascom [phone] 05/10/18, 3:05 PM

## 2018-05-10 NOTE — Discharge Summary (Signed)
[facility] NAME: Darra Rosa    MR#:  [id number]  DATE OF BIRTH:  [birth year]  DATE OF ADMISSION:  05/09/2018   ADMITTING PHYSICIAN: Nicholes Mango, MD  DATE OF DISCHARGE:  05/10/18  PRIMARY CARE PHYSICIAN: [name], [name], MD   ADMISSION DIAGNOSIS:   Amnesia [R41.3] Altered mental status, unspecified altered mental status type [R41.82]  DISCHARGE DIAGNOSIS:   Active Problems:   TIA (transient ischemic attack)   SECONDARY DIAGNOSIS:   Past Medical History:  Diagnosis Date  . Anginal pain ([place])   . Arthritis    Knees  . B12 deficiency    Mild  . Bucket-handle tear of lateral meniscus of left knee as current injury 12/30/2012  . Cancer ([facility])    Skin, basal cell on nose and eyelid  . DJD (degenerative joint disease)    Low back  . Dyspepsia   . GERD (gastroesophageal reflux disease)   . Heart murmur   . Heart palpitations   . HOH (hard of hearing)   . Hyperlipidemia   . Hypertension   . Lactose intolerance   . Mixed incontinence   . PONV (postoperative nausea and vomiting)   . Vulvar irritation    from urine pad, uses Triamcinolone oint.    HOSPITAL COURSE:   83 year old female with past medical history significant for arthritis, GERD, hypertension, hyperlipidemia presents to hospital secondary to transient confusion.  1.  Transient global amnesia-treated as TIA -Symptoms have resolved completely -MRI negative for any acute infarcts.  Echocardiogram is pending -Carotid Dopplers with no hemodynamically significant stenosis. -Started on aspirin, continue low-dose statin -Physical therapy and Occupational Therapy followed the patient, she did not require any outpatient services.  Very independent at baseline. -EEG was done which was normal.  2.  Acute cystitis-urine cultures are pending -Received Rocephin in the hospital.  Will discharge on Keflex  3.  Colitis-continue outpatient medications  4.   Hypertension on Avapro and chlorthalidone  5.  GERD-PPI  Ambulating, very independent here.  Being discharged home today   DISCHARGE CONDITIONS:   Guarded  CONSULTS OBTAINED:   Treatment Team:  Alexis Goodell, MD  DRUG ALLERGIES:   Allergies  Allergen Reactions  . Amlodipine Besylate Other (See Comments)    Reaction:  Increases BP  . Antihistamines, Chlorpheniramine-Type Other (See Comments)    Reaction:  Makes pt hyper   . Atenolol Hypertension  . Cefuroxime Axetil Other (See Comments)    Upset stomach  . Chlorthalidone Other (See Comments)  . Ciprofloxacin Other (See Comments)    Upset stomach  . Co Q 10 [Coenzyme Q10] Other (See Comments)    Reaction:  GI upset   . Crestor [Rosuvastatin Calcium] Other (See Comments)    Reaction:  Myalgias   . Dextromethorphan-Guaifenesin Other (See Comments)    Reaction:  Made pt jittery   . Epinephrine Hives  . Ramipril Other (See Comments)    Upset stomach  . Sulfamethoxazole-Trimethoprim Other (See Comments)    Upset stomach  . Vitamin D Analogs Other (See Comments)    Reaction:  GI upset    DISCHARGE MEDICATIONS:   Allergies as of 05/10/2018      Reactions   Amlodipine Besylate Other (See Comments)   Reaction:  Increases BP   Antihistamines, Chlorpheniramine-type Other (See Comments)   Reaction:  Makes pt hyper    Atenolol Hypertension   Cefuroxime Axetil Other (See Comments)   Upset stomach   Chlorthalidone Other (  See Comments)   Ciprofloxacin Other (See Comments)   Upset stomach   Co Q 10 [coenzyme Q10] Other (See Comments)   Reaction:  GI upset    Crestor [rosuvastatin Calcium] Other (See Comments)   Reaction:  Myalgias   Dextromethorphan-guaifenesin Other (See Comments)   Reaction:  Made pt jittery    Epinephrine Hives   Ramipril Other (See Comments)   Upset stomach   Sulfamethoxazole-trimethoprim Other (See Comments)   Upset stomach   Vitamin D Analogs Other (See Comments)   Reaction:  GI upset         Medication List    TAKE these medications   acetaminophen 500 MG tablet Commonly known as:  TYLENOL Take 500 mg by mouth every 6 (six) hours as needed.   APRISO 0.375 g 24 hr capsule Generic drug:  mesalamine Take 3 capsules by mouth daily.   aspirin EC 81 MG tablet Take 1 tablet (81 mg total) by mouth daily. What changed:    how much to take  when to take this   atorvastatin 10 MG tablet Commonly known as:  LIPITOR TAKE 1 TABLET(10 MG) BY MOUTH DAILY AT 6 PM   CALCIUM 600+D PO Take 1 tablet by mouth daily.   cephALEXin 250 MG capsule Commonly known as:  KEFLEX Take 1 capsule (250 mg total) by mouth 3 (three) times daily for 5 days.   chlorthalidone 25 MG tablet Commonly known as:  HYGROTON Take 12.5 mg by mouth 3 (three) times a week. Take 1/2 tablet every two days.   clobetasol ointment 0.05 % Commonly known as:  TEMOVATE Apply 1 application topically 2 (two) times daily. To affected area   clorazepate 7.5 MG tablet Commonly known as:  TRANXENE Take 1 tablet (7.5 mg total) by mouth daily as needed.   cyanocobalamin 1000 MCG/ML injection Commonly known as:  (VITAMIN B-12) Inject 1,000 mcg into the muscle every 30 (thirty) days.   hydrocortisone 2.5 % rectal cream Commonly known as:  ANUSOL-HC Place 1 application rectally 2 (two) times daily as needed for hemorrhoids.   irbesartan 300 MG tablet Commonly known as:  AVAPRO Take 1 tablet (300 mg total) by mouth daily.   omeprazole 20 MG capsule Commonly known as:  PRILOSEC TAKE 1 CAPSULE(20 MG) BY MOUTH DAILY AS NEEDED FOR HEARTBURN        DISCHARGE INSTRUCTIONS:   1.  PCP follow-up in 1 to 2 weeks  DIET:   Cardiac diet  ACTIVITY:   Activity as tolerated  OXYGEN:   Home Oxygen: No.  Oxygen Delivery: room air  DISCHARGE LOCATION:   home   If you experience worsening of your admission symptoms, develop shortness of breath, life threatening emergency, suicidal or homicidal thoughts you  must seek medical attention immediately by calling 911 or calling your MD immediately  if symptoms less severe.  You Must read complete instructions/literature along with all the possible adverse reactions/side effects for all the Medicines you take and that have been prescribed to you. Take any new Medicines after you have completely understood and accpet all the possible adverse reactions/side effects.   Please note  You were cared for by a hospitalist during your hospital stay. If you have any questions about your discharge medications or the care you received while you were in the hospital after you are discharged, you can call the unit and asked to speak with the hospitalist on call if the hospitalist that took care of you is not available. Once you  are discharged, your primary care physician will handle any further medical issues. Please note that NO REFILLS for any discharge medications will be authorized once you are discharged, as it is imperative that you return to your primary care physician (or establish a relationship with a primary care physician if you do not have one) for your aftercare needs so that they can reassess your need for medications and monitor your lab values.    On the day of Discharge:  VITAL SIGNS:   Blood pressure (!) 140/57, pulse 65, temperature 97.9 F (36.6 C), temperature source Oral, resp. rate 16, height 5' 4.5" (1.638 m), weight 88 kg (194 lb), SpO2 97 %.  PHYSICAL EXAMINATION:    GENERAL:  83 y.o.-year-old patient lying in the bed with no acute distress.  EYES: Pupils equal, round, reactive to light and accommodation. No scleral icterus. Extraocular muscles intact.  HEENT: Head atraumatic, normocephalic. Oropharynx and nasopharynx clear.  NECK:  Supple, no jugular venous distention. No thyroid enlargement, no tenderness.  LUNGS: Normal breath sounds bilaterally, no wheezing, rales,rhonchi or crepitation. No use of accessory muscles of respiration.    CARDIOVASCULAR: S1, S2 normal. No  rubs, or gallops. 2/6 systolic murmur present. ABDOMEN: Soft, non-tender, non-distended. Bowel sounds present. No organomegaly or mass.  EXTREMITIES: No pedal edema, cyanosis, or clubbing.  NEUROLOGIC: Cranial nerves II through XII are intact. Speech is normal. Muscle strength 5/5 in all extremities. Sensation intact. Gait not checked.  PSYCHIATRIC: The patient is alert and oriented x 3.  SKIN: No obvious rash, lesion, or ulcer.   DATA REVIEW:   CBC Recent Labs  Lab 05/09/18 1434  WBC 8.3  HGB 13.5  HCT 39.3  PLT 301    Chemistries  Recent Labs  Lab 05/09/18 1434  NA 138  K 3.9  CL 103  CO2 25  GLUCOSE 104*  BUN 20  CREATININE 0.74  CALCIUM 9.7  AST 19  ALT 13*  ALKPHOS 46  BILITOT 0.6     Microbiology Results  Results for orders placed or performed in visit on 02/16/18  Urine Culture     Status: Abnormal   Collection Time: 02/16/18  4:22 PM  Result Value Ref Range Status   MICRO NUMBER: [id number]  Final   SPECIMEN QUALITY: ADEQUATE  Final   Sample Source URINE  Final   STATUS: FINAL  Final   ISOLATE 1: Klebsiella pneumoniae (A)  Final    Comment: Greater than 100,000 CFU/mL of Klebsiella pneumoniae      Susceptibility   Klebsiella pneumoniae - URINE CULTURE, REFLEX    AMOX/CLAVULANIC <=2 Sensitive     AMPICILLIN  Resistant     AMPICILLIN/SULBACTAM 4 Sensitive     CEFAZOLIN* <=4 Not Reportable      * For infections other than uncomplicated UTIcaused by E. coli, K. pneumoniae or P. mirabilis:Cefazolin is resistant if MIC > or = 8 mcg/mL.(Distinguishing susceptible versus intermediatefor isolates with MIC < or = 4 mcg/mL requiresadditional testing.)For uncomplicated UTI caused by E. coli,K. pneumoniae or P. mirabilis: Cefazolin issusceptible if MIC <32 mcg/mL and predictssusceptible to the oral agents cefaclor, cefdinir,cefpodoxime, cefprozil, cefuroxime, cephalexinand loracarbef.    CEFEPIME <=1 Sensitive     CEFTRIAXONE <=1  Sensitive     CIPROFLOXACIN <=0.25 Sensitive     LEVOFLOXACIN <=0.12 Sensitive     ERTAPENEM <=0.5 Sensitive     GENTAMICIN <=1 Sensitive     IMIPENEM <=0.25 Sensitive     NITROFURANTOIN <=16 Sensitive     PIP/TAZO <=4  Sensitive     TOBRAMYCIN <=1 Sensitive     TRIMETH/SULFA* <=20 Sensitive      * For infections other than uncomplicated UTIcaused by E. coli, K. pneumoniae or P. mirabilis:Cefazolin is resistant if MIC > or = 8 mcg/mL.(Distinguishing susceptible versus intermediatefor isolates with MIC < or = 4 mcg/mL requiresadditional testing.)For uncomplicated UTI caused by E. coli,K. pneumoniae or P. mirabilis: Cefazolin issusceptible if MIC <32 mcg/mL and predictssusceptible to the oral agents cefaclor, cefdinir,cefpodoxime, cefprozil, cefuroxime, cephalexinand loracarbef.Legend:S = Susceptible  I = IntermediateR = Resistant  NS = Not susceptible* = Not tested  NR = Not reported**NN = See antimicrobic comments    RADIOLOGY:  Mr Brain Wo Contrast  Result Date: 05/09/2018 CLINICAL DATA:  Initial evaluation for acute confusion, blurry vision, now resolved. EXAM: MRI HEAD WITHOUT CONTRAST TECHNIQUE: Multiplanar, multiecho pulse sequences of the brain and surrounding structures were obtained without intravenous contrast. COMPARISON:  Prior CT from earlier the same day. FINDINGS: Brain: Generalized age appropriate cerebral atrophy. Patchy T2/FLAIR hyperintensity within the periventricular and deep white matter both cerebral hemispheres, most like related chronic small vessel ischemic change, felt to be within normal limits for age. No abnormal foci of restricted diffusion to suggest acute or subacute ischemia. Gray-white matter differentiation maintained. No encephalomalacia to suggest chronic infarction. No foci of susceptibility artifact to suggest acute or chronic intracranial hemorrhage. No mass lesion, midline shift or mass effect. No hydrocephalus. No extra-axial fluid collection. Normal pituitary  gland. Vascular: Major intracranial vascular flow voids are well maintained. Skull and upper cervical spine: Craniocervical junction normal. Upper cervical spine within normal limits. Normal bone marrow signal intensity. Small lipoma noted at the right forehead. Scalp soft tissues demonstrate no acute abnormality. Sinuses/Orbits: Globes and orbital soft tissues demonstrate no acute finding. Mild scattered mucosal thickening throughout the paranasal sinuses. No significant mastoid effusion. Inner ear structures normal. Other: None. IMPRESSION: Normal brain MRI for age. No acute intracranial abnormality identified. Electronically Signed   By: Jeannine Boga M.D.   On: 05/09/2018 21:59   US Carotid Bilateral (at [facility] Only)  Result Date: 05/10/2018 CLINICAL DATA:  TIA symptoms, hypertension, blurred vision EXAM: BILATERAL CAROTID DUPLEX ULTRASOUND TECHNIQUE: Pearline Cables scale imaging, color Doppler and duplex ultrasound were performed of bilateral carotid and vertebral arteries in the neck. COMPARISON:  None. FINDINGS: Criteria: Quantification of carotid stenosis is based on velocity parameters that correlate the residual internal carotid diameter with NASCET-based stenosis levels, using the diameter of the distal internal carotid lumen as the denominator for stenosis measurement. The following velocity measurements were obtained: RIGHT ICA: 67/12 cm/sec CCA: 76/1 cm/sec SYSTOLIC ICA/CCA RATIO:  1.1 ECA:  82 cm/sec LEFT ICA: 65/17 cm/sec CCA: 95/09 cm/sec SYSTOLIC ICA/CCA RATIO:  0.9 ECA:  75 cm/sec RIGHT CAROTID ARTERY: Minor carotid intimal thickening and atherosclerosis. No hemodynamically significant right ICA stenosis, velocity elevation, or turbulent flow. Degree of narrowing less than 50%. RIGHT VERTEBRAL ARTERY:  Antegrade LEFT CAROTID ARTERY: Similar scattered minor carotid intimal thickening and atherosclerosis. No hemodynamically significant left ICA stenosis, velocity elevation, or turbulent flow.  LEFT VERTEBRAL ARTERY:  Antegrade. Right thyroid hypoechoic solid nodule measures 3.1 cm in greatest dimension. Recommend follow-up thyroid ultrasound. IMPRESSION: Minor carotid intimal thickening and atherosclerosis. No hemodynamically significant ICA stenosis. Degree of narrowing less than 50% bilaterally by ultrasound criteria. Patent antegrade vertebral flow bilaterally 3.1 cm right thyroid nodule warrants further evaluation with follow-up thyroid ultrasound. Electronically Signed   By: Jerilynn Mages.  Shick M.D.   On: 05/10/2018 14:41  Management plans discussed with the patient, family and they are in agreement.  CODE STATUS:     Code Status Orders  (From admission, onward)        Start     Ordered   05/09/18 1857  Do not attempt resuscitation (DNR)  Continuous    Question Answer Comment  In the event of cardiac or respiratory ARREST Do not call a "code blue"   In the event of cardiac or respiratory ARREST Do not perform Intubation, CPR, defibrillation or ACLS   In the event of cardiac or respiratory ARREST Use medication by any route, position, wound care, and other measures to relive pain and suffering. May use oxygen, suction and manual treatment of airway obstruction as needed for comfort.   Comments rn may pronounce      05/09/18 1856    Code Status History    Date Active Date Inactive Code Status Order ID Comments User Context   05/09/2018 1854 05/09/2018 1856 DNR [id number]  Nicholes Mango, MD Inpatient   03/15/2017 1025 03/15/2017 1540 Full Code [id number]  Wellington Hampshire, MD Inpatient   08/30/2015 2021 08/31/2015 1645 Full Code [id number]  Hower, Aaron Mose, MD ED    Advance Directive Documentation     Most Recent Value  Type of Advance Directive  Living will  Pre-existing out of facility DNR order (yellow form or pink MOST form)  -  "MOST" Form in Place?  -      TOTAL TIME TAKING CARE OF THIS PATIENT: 38 minutes.    Gladstone Lighter M.D on 05/10/2018 at 3:20 PM  Between 7am to  6pm - Pager - [phone]  After 6pm go to [email or web address] - [employer] [facility]  Office  [phone]  CC: Primary care physician; [name], [name], MD   Note: This dictation was prepared with Dragon dictation along with smaller phrase technology. Any transcriptional errors that result from this process are unintentional.

## 2018-05-10 NOTE — Care Management Obs Status (Signed)
[facility] NOTIFICATION   Patient Details  Name: Tammy Manning MRN: [id number] Date of Birth: 01/29/1935   Medicare Observation Status Notification Given:  Yes    Shelbie Ammons, RN 05/10/2018, 8:14 AM

## 2018-05-11 ENCOUNTER — Encounter: Payer: Self-pay | Admitting: *Deleted

## 2018-05-11 ENCOUNTER — Ambulatory Visit: Payer: Medicare Other | Admitting: Family Medicine

## 2018-05-12 ENCOUNTER — Ambulatory Visit (INDEPENDENT_AMBULATORY_CARE_PROVIDER_SITE_OTHER): Payer: Medicare Other | Admitting: Family Medicine

## 2018-05-12 ENCOUNTER — Encounter: Payer: Self-pay | Admitting: Family Medicine

## 2018-05-12 VITALS — BP 130/66 | HR 65 | Temp 98.2°F | Ht 64.25 in | Wt 194.5 lb

## 2018-05-12 DIAGNOSIS — N3000 Acute cystitis without hematuria: Secondary | ICD-10-CM | POA: Diagnosis not present

## 2018-05-12 DIAGNOSIS — N390 Urinary tract infection, site not specified: Secondary | ICD-10-CM | POA: Insufficient documentation

## 2018-05-12 DIAGNOSIS — G454 Transient global amnesia: Secondary | ICD-10-CM | POA: Diagnosis not present

## 2018-05-12 DIAGNOSIS — E041 Nontoxic single thyroid nodule: Secondary | ICD-10-CM

## 2018-05-12 DIAGNOSIS — I10 Essential (primary) hypertension: Secondary | ICD-10-CM | POA: Diagnosis not present

## 2018-05-12 DIAGNOSIS — G459 Transient cerebral ischemic attack, unspecified: Secondary | ICD-10-CM

## 2018-05-12 LAB — URINE CULTURE
Culture: >=100000 — AB
Special Requests: NORMAL

## 2018-05-12 MED ORDER — IRBESARTAN 300 MG PO TABS: 150.0000 mg | ORAL_TABLET | Freq: Every day | ORAL | 3 refills | Status: DC

## 2018-05-12 NOTE — Patient Instructions (Signed)
Finish keflex  Drink your water  Cut avapro in 1/2   Take a half pill daily  Watch BP   Keep me updated   We will refer for a thyroid nodule ultrasound   We want BP below 140/90

## 2018-05-12 NOTE — Progress Notes (Signed)
Subjective:    Patient ID: Tammy Manning, female    DOB: 1935/10/31, 83 y.o.   MRN: [id number]  HPI Here for hospital f/u  Admitted from 6/17 to 6/18  Amnesia and altered mental status - diag with possible TIA  Hospital course as follows   83 year old female with past medical history significant for arthritis, GERD, hypertension, hyperlipidemia presents to hospital secondary to transient confusion.  1.  Transient global amnesia-treated as TIA -Symptoms have resolved completely -MRI negative for any acute infarcts.  Echocardiogram is pending -Carotid Dopplers with no hemodynamically significant stenosis. -Started on aspirin, continue low-dose statin -Physical therapy and Occupational Therapy followed the patient, she did not require any outpatient services.  Very independent at baseline. -EEG was done which was normal.  Echo returned as follows: Study Conclusions  - Left ventricle: The cavity size was normal. Wall thickness was   increased in a pattern of mild LVH. Systolic function was normal.   The estimated ejection fraction was in the range of 55% to 60%.   Wall motion was normal; there were no regional wall motion   abnormalities. Doppler parameters are consistent with abnormal   left ventricular relaxation (grade 1 diastolic dysfunction).   Doppler parameters are consistent with high ventricular filling   pressure. - Aortic valve: There was mild regurgitation. - Mitral valve: Calcified annulus. - Left atrium: The atrium was mildly dilated. - Right ventricle: The cavity size was mildly dilated. Systolic   function was normal.  R thyroid nodule was seen on her carotid study    2.  Acute cystitis-urine cultures are pending -Received Rocephin in the hospital.  Will discharge on Keflex  Urine cx grew klebsiella that is sens to keflex   3.  Colitis-continue outpatient medications  4.  Hypertension on Avapro and chlorthalidone  5.  GERD-PPI  On asa 81 mg    Atorvastatin  Keflex for uti   Labs: Lab Results  Component Value Date   CREATININE 0.74 05/09/2018   BUN 20 05/09/2018   NA 138 05/09/2018   K 3.9 05/09/2018   CL 103 05/09/2018   CO2 25 05/09/2018   Lab Results  Component Value Date   ALT 13 (L) 05/09/2018   AST 19 05/09/2018   ALKPHOS 64 05/09/2018   BILITOT 0.6 05/09/2018   Lab Results  Component Value Date   WBC 8.3 05/09/2018   HGB 13.5 05/09/2018   HCT 39.3 05/09/2018   MCV 92.7 05/09/2018   PLT 301 05/09/2018   Lab Results  Component Value Date   TSH 1.06 04/21/2018    Lab Results  Component Value Date   CHOL 174 05/10/2018   HDL 57 05/10/2018   LDLCALC 93 05/10/2018   LDLDIRECT 105.9 02/08/2013   TRIG 118 05/10/2018   CHOLHDL 3.1 05/10/2018     Ct Head Wo Contrast  Result Date: 05/09/2018 CLINICAL DATA:  Altered mental status. EXAM: CT HEAD WITHOUT CONTRAST TECHNIQUE: Contiguous axial images were obtained from the base of the skull through the vertex without intravenous contrast. COMPARISON:  MR brain dated November 28, 2010. FINDINGS: Brain: No evidence of acute infarction, hemorrhage, hydrocephalus, extra-axial collection or mass lesion/mass effect. Stable mild age related cerebral atrophy. Vascular: No hyperdense vessel or unexpected calcification. Skull: Negative for fracture or focal lesion. Sinuses/Orbits: No acute finding. Other: None. IMPRESSION: 1. Normal for age noncontrast head CT. Electronically Signed   By: Titus Dubin M.D.   On: 05/09/2018 14:46   Mr Brain Wo Contrast  Result  Date: 05/09/2018 CLINICAL DATA:  Initial evaluation for acute confusion, blurry vision, now resolved. EXAM: MRI HEAD WITHOUT CONTRAST TECHNIQUE: Multiplanar, multiecho pulse sequences of the brain and surrounding structures were obtained without intravenous contrast. COMPARISON:  Prior CT from earlier the same day. FINDINGS: Brain: Generalized age appropriate cerebral atrophy. Patchy T2/FLAIR hyperintensity within the  periventricular and deep white matter both cerebral hemispheres, most like related chronic small vessel ischemic change, felt to be within normal limits for age. No abnormal foci of restricted diffusion to suggest acute or subacute ischemia. Gray-white matter differentiation maintained. No encephalomalacia to suggest chronic infarction. No foci of susceptibility artifact to suggest acute or chronic intracranial hemorrhage. No mass lesion, midline shift or mass effect. No hydrocephalus. No extra-axial fluid collection. Normal pituitary gland. Vascular: Major intracranial vascular flow voids are well maintained. Skull and upper cervical spine: Craniocervical junction normal. Upper cervical spine within normal limits. Normal bone marrow signal intensity. Small lipoma noted at the right forehead. Scalp soft tissues demonstrate no acute abnormality. Sinuses/Orbits: Globes and orbital soft tissues demonstrate no acute finding. Mild scattered mucosal thickening throughout the paranasal sinuses. No significant mastoid effusion. Inner ear structures normal. Other: None. IMPRESSION: Normal brain MRI for age. No acute intracranial abnormality identified. Electronically Signed   By: Jeannine Boga M.D.   On: 05/09/2018 21:59   US Carotid Bilateral (at [facility] Only)  Result Date: 05/10/2018 CLINICAL DATA:  TIA symptoms, hypertension, blurred vision EXAM: BILATERAL CAROTID DUPLEX ULTRASOUND TECHNIQUE: Pearline Cables scale imaging, color Doppler and duplex ultrasound were performed of bilateral carotid and vertebral arteries in the neck. COMPARISON:  None. FINDINGS: Criteria: Quantification of carotid stenosis is based on velocity parameters that correlate the residual internal carotid diameter with NASCET-based stenosis levels, using the diameter of the distal internal carotid lumen as the denominator for stenosis measurement. The following velocity measurements were obtained: RIGHT ICA: 67/12 cm/sec CCA: 14/4 cm/sec SYSTOLIC  ICA/CCA RATIO:  1.1 ECA:  82 cm/sec LEFT ICA: 65/17 cm/sec CCA: 81/85 cm/sec SYSTOLIC ICA/CCA RATIO:  0.9 ECA:  75 cm/sec RIGHT CAROTID ARTERY: Minor carotid intimal thickening and atherosclerosis. No hemodynamically significant right ICA stenosis, velocity elevation, or turbulent flow. Degree of narrowing less than 50%. RIGHT VERTEBRAL ARTERY:  Antegrade LEFT CAROTID ARTERY: Similar scattered minor carotid intimal thickening and atherosclerosis. No hemodynamically significant left ICA stenosis, velocity elevation, or turbulent flow. LEFT VERTEBRAL ARTERY:  Antegrade. Right thyroid hypoechoic solid nodule measures 3.1 cm in greatest dimension. Recommend follow-up thyroid ultrasound. IMPRESSION: Minor carotid intimal thickening and atherosclerosis. No hemodynamically significant ICA stenosis. Degree of narrowing less than 50% bilaterally by ultrasound criteria. Patent antegrade vertebral flow bilaterally 3.1 cm right thyroid nodule warrants further evaluation with follow-up thyroid ultrasound. Electronically Signed   By: Jerilynn Mages.  Shick M.D.   On: 05/10/2018 14:41     Had one episode of hypotension yesterday  87/43 after she took her medicine  Wonders if we should reduce her bp med   On chlorthalidone 25 mg --takes 12.5 mg three times weekly  avapro 300  Often comes down after exercise  Is walking a lot  Feels a bit draggy lately   BP Readings from Last 3 Encounters:  05/12/18 130/66  05/10/18 (!) 140/57  04/22/18 110/62  did not take any med today    Wt Readings from Last 3 Encounters:  05/12/18 194 lb 8 oz (88.2 kg)  05/09/18 194 lb (88 kg)  04/22/18 197 lb (89.4 kg)   33.13 kg/m   Had her colonoscopy  with Dr Allyn Kenner- has diverticulosis  May have to have hemorrhoid taken care of    Patient Active Problem List   Diagnosis Date Noted  . Transient global amnesia 05/12/2018  . Right thyroid nodule 05/12/2018  . UTI (urinary tract infection) 05/12/2018  . TIA (transient ischemic attack)  05/09/2018  . Angina, class II ([place]) 09/10/2015  . Aortic insufficiency 09/10/2015  . Stress reaction 09/08/2015  . Encounter for Medicare annual wellness exam 02/28/2013  . Bucket-handle tear of lateral meniscus of left knee as current injury 12/30/2012  . Episodic atrial fibrillation ([place]) 11/17/2012  . Special screening for malignant neoplasms, colon 01/21/2012  . Hearing loss of both ears 01/14/2012  . Adverse drug reaction 03/12/2011  . HEADACHE, CHRONIC 09/24/2010  . B12 deficiency 03/17/2010  . DYSPEPSIA 03/17/2010  . POSTMENOPAUSAL STATUS 08/20/2008  . Essential hypertension 07/19/2007  . Hyperlipidemia 06/24/2007  . CONSTIPATION 06/24/2007  . IBS 06/24/2007  . DERMATITIS, ATOPIC 06/24/2007  . SKIN CANCER, HX OF 06/24/2007   Past Medical History:  Diagnosis Date  . Anginal pain ([place])   . Arthritis    Knees  . B12 deficiency    Mild  . Bucket-handle tear of lateral meniscus of left knee as current injury 12/30/2012  . Cancer ([facility])    Skin, basal cell on nose and eyelid  . DJD (degenerative joint disease)    Low back  . Dyspepsia   . GERD (gastroesophageal reflux disease)   . Heart murmur   . Heart palpitations   . HOH (hard of hearing)   . Hyperlipidemia   . Hypertension   . Lactose intolerance   . Mixed incontinence   . PONV (postoperative nausea and vomiting)   . Vulvar irritation    from urine pad, uses Triamcinolone oint.   Past Surgical History:  Procedure Laterality Date  . ABDOMINAL AORTOGRAM N/A 03/15/2017   Procedure: Abdominal Aortogram;  Surgeon: Wellington Hampshire, MD;  Location: [facility];  Service: Cardiovascular;  Laterality: N/A;  . ABDOMINAL HYSTERECTOMY  1982   Large fibroids and bleeding  . APPENDECTOMY    . BILATERAL SALPINGOOPHORECTOMY  1986  . BONE GRAFT HIP ILIAC CREST     To Left arm  . BREAST REDUCTION SURGERY    . BREAST SURGERY    . CATARACT EXTRACTION W/PHACO Left 07/07/2016   Procedure: CATARACT EXTRACTION PHACO AND  INTRAOCULAR LENS PLACEMENT (IOC);  Surgeon: Birder Robson, MD;  Location: [facility];  Service: Ophthalmology;  Laterality: Left;  [place] 01:10 AP% 18.3 CDE 12.91 Fluid pak lot # [id number]  . CATARACT EXTRACTION W/PHACO Right 07/28/2016   Procedure: CATARACT EXTRACTION PHACO AND INTRAOCULAR LENS PLACEMENT ([place]);  Surgeon: Birder Robson, MD;  Location: [facility];  Service: Ophthalmology;  Laterality: Right;  [place] 01:26 AP% 18.5 CDE 16.12 Fluid pack lot # [id number]  . COLONOSCOPY  07/2016   Dr. Thana Farr  . DILATION AND CURETTAGE OF UTERUS     miscarragesx2  . ESOPHAGOGASTRODUODENOSCOPY     Polyps  . [place]  2006  . KNEE ARTHROSCOPY WITH LATERAL MENISECTOMY Left 12/30/2012   Procedure: KNEE ARTHROSCOPY WITH LATERAL MENISECTOMY;  Surgeon: Johnny Bridge, MD;  Location: [facility];  Service: Orthopedics;  Laterality: Left;  LEFT KNEE SCOPE LATERAL MENISCECTOMY  . LEFT HEART CATH AND CORONARY ANGIOGRAPHY N/A 03/15/2017   Procedure: Left Heart Cath and Coronary Angiography;  Surgeon: Wellington Hampshire, MD;  Location: [facility];  Service: Cardiovascular;  Laterality: N/A;  .  MOUTH SURGERY    . skin cancer eyelid  2012  . TONSILLECTOMY     Social History   Tobacco Use  . Smoking status: Former Smoker    Packs/day: 1.00    Years: 12.00    Pack years: 12.00    Types: Cigarettes    Last attempt to quit: 11/24/1963    Years since quitting: 54.5  . Smokeless tobacco: Never Used  Substance Use Topics  . Alcohol use: No    Alcohol/week: 0.0 oz    Comment: Rare  . Drug use: No   Family History  Problem Relation Age of Onset  . Cancer Mother        Colon  . Heart disease Father        CAD  . Diabetes Sister   . Diabetes Brother   . Leukemia Brother   . Cancer Paternal Aunt        Breast  . Diabetes Sister    Allergies  Allergen Reactions  . Amlodipine Besylate Other (See Comments)    Reaction:  Increases BP  . Antihistamines, Chlorpheniramine-Type Other  (See Comments)    Reaction:  Makes pt hyper   . Atenolol Hypertension  . Cefuroxime Axetil Other (See Comments)    Upset stomach  . Chlorthalidone Other (See Comments)  . Ciprofloxacin Other (See Comments)    Upset stomach  . Co Q 10 [Coenzyme Q10] Other (See Comments)    Reaction:  GI upset   . Crestor [Rosuvastatin Calcium] Other (See Comments)    Reaction:  Myalgias   . Dextromethorphan-Guaifenesin Other (See Comments)    Reaction:  Made pt jittery   . Epinephrine Hives  . Ramipril Other (See Comments)    Upset stomach  . Sulfamethoxazole-Trimethoprim Other (See Comments)    Upset stomach  . Vitamin D Analogs Other (See Comments)    Reaction:  GI upset    Current Outpatient Medications on File Prior to Visit  Medication Sig Dispense Refill  . acetaminophen (TYLENOL) 500 MG tablet Take 500 mg by mouth every 6 (six) hours as needed.    [name] Kitchen aspirin EC 81 MG tablet Take 1 tablet (81 mg total) by mouth daily. 30 tablet 2  . atorvastatin (LIPITOR) 10 MG tablet TAKE 1 TABLET(10 MG) BY MOUTH DAILY AT 6 PM 90 tablet 3  . Calcium Carbonate-Vitamin D (CALCIUM 600+D PO) Take 1 tablet by mouth daily.     . cephALEXin (KEFLEX) 250 MG capsule Take 1 capsule (250 mg total) by mouth 3 (three) times daily for 5 days. 15 capsule 0  . chlorthalidone (HYGROTON) 25 MG tablet Take 12.5 mg by mouth 3 (three) times a week. Take 1/2 tablet every two days.     . clobetasol ointment (TEMOVATE) 8.84 % Apply 1 application topically 2 (two) times daily. To affected area 30 g 1  . clorazepate (TRANXENE) 7.5 MG tablet Take 1 tablet (7.5 mg total) by mouth daily as needed. 30 tablet 0  . cyanocobalamin (,VITAMIN B-12,) 1000 MCG/ML injection Inject 1,000 mcg into the muscle every 30 (thirty) days.     . hydrocortisone (ANUSOL-HC) 2.5 % rectal cream Place 1 application rectally 2 (two) times daily as needed for hemorrhoids.     [name] Kitchen omeprazole (PRILOSEC) 20 MG capsule TAKE 1 CAPSULE(20 MG) BY MOUTH DAILY AS NEEDED FOR  HEARTBURN 90 capsule 3  . APRISO 0.375 g 24 hr capsule Take 3 capsules by mouth daily.  0   No current facility-administered medications on file prior  to visit.     Review of Systems  Constitutional: Negative for activity change, appetite change, fatigue, fever and unexpected weight change.  HENT: Negative for congestion, ear pain, rhinorrhea, sinus pressure and sore throat.   Eyes: Negative for pain, redness and visual disturbance.  Respiratory: Negative for cough, shortness of breath and wheezing.   Cardiovascular: Negative for chest pain and palpitations.  Gastrointestinal: Negative for abdominal pain, blood in stool, constipation and diarrhea.  Endocrine: Negative for polydipsia and polyuria.  Genitourinary: Negative for dysuria, frequency and urgency.  Musculoskeletal: Negative for arthralgias, back pain and myalgias.  Skin: Negative for pallor and rash.  Allergic/Immunologic: Negative for environmental allergies.  Neurological: Negative for dizziness, tremors, seizures, syncope, facial asymmetry, speech difficulty, weakness, numbness and headaches.       Had some brief dizziness with low bp that is now resolved   Hematological: Negative for adenopathy. Does not bruise/bleed easily.  Psychiatric/Behavioral: Negative for decreased concentration and dysphoric mood. The patient is not nervous/anxious.        Stress-daughter moved out of state        Objective:   Physical Exam  Constitutional: She appears well-developed and well-nourished. No distress.  Well appearing   HENT:  Head: Normocephalic and atraumatic.  Mouth/Throat: Oropharynx is clear and moist.  Eyes: Pupils are equal, round, and reactive to light. Conjunctivae and EOM are normal.  Neck: Normal range of motion. Neck supple. No JVD present. Carotid bruit is not present. No thyromegaly present.  Cardiovascular: Normal rate, regular rhythm, normal heart sounds and intact distal pulses. Exam reveals no gallop.    Pulmonary/Chest: Effort normal and breath sounds normal. No stridor. No respiratory distress. She has no wheezes. She has no rales.  No crackles  Abdominal: Soft. Bowel sounds are normal. She exhibits no distension, no abdominal bruit and no mass. There is no tenderness.  Musculoskeletal: She exhibits no edema.  Lymphadenopathy:    She has no cervical adenopathy.  Neurological: She is alert. She has normal reflexes. She displays no atrophy, no tremor and normal reflexes. No cranial nerve deficit or sensory deficit. She exhibits normal muscle tone. Coordination normal.  No focal cerebellar signs   Skin: Skin is warm and dry. Capillary refill takes less than 2 seconds. No rash noted.  Psychiatric: She has a normal mood and affect. Her speech is normal. Her mood appears not anxious. Her affect is not blunt and not labile. She is not actively hallucinating. Cognition and memory are normal. She does not exhibit a depressed mood.  Pleasant, cheerful and talkative  She is attentive.          Assessment & Plan:   Problem List Items Addressed This Visit      Cardiovascular and Mediastinum   Essential hypertension    Reviewed bp readings from home- tend to be low and one caused symptoms of hypotension  Unsure if related to her amnesia episode  Will cut her avapro 300 mg to 150 daily  She will monitor at home and alert [place] if high or low bp Lifestyle change discussed       Relevant Medications   irbesartan (AVAPRO) 300 MG tablet   TIA (transient ischemic attack)    Unsure if her transient global amnesia episode can be attrib to this  Reviewed hospital records, lab results and studies in detail   Reassuring - imaging/ carotid dop/echo and labs On asa 81 mg  Watch bp and lipids closely  Alert if any re occurrence  Relevant Medications   irbesartan (AVAPRO) 300 MG tablet     Endocrine   Right thyroid nodule    Seen incidentally on a carotid study  Will order dedicated thyroid  US Lab Results  Component Value Date   TSH 1.06 04/21/2018   No symptoms       Relevant Orders   US THYROID     Genitourinary   UTI (urinary tract infection)    Klebsiella  Found in hosp in midst of MS change  On keflex- rev cx Reviewed hospital records, lab results and studies in detail   Alert if no further improvement        Other   Transient global amnesia - Primary    Reviewed hospital records, lab results and studies in detail  Brief and resolved quickly in hosp  W/u for TIA fairly normal  Very reassuring imaging/labs/echo/carotid study Feels normal now  Some low bp at home-will cut avapro in 1/2 and update  Disc stressor (daughter moving)- may or may not have played a role Will watch closely  Continue asa daily

## 2018-05-12 NOTE — Assessment & Plan Note (Signed)
Unsure if her transient global amnesia episode can be attrib to this  Reviewed hospital records, lab results and studies in detail   Reassuring - imaging/ carotid dop/echo and labs On asa 81 mg  Watch bp and lipids closely  Alert if any re occurrence

## 2018-05-12 NOTE — Assessment & Plan Note (Signed)
Klebsiella  Found in hosp in midst of MS change  On keflex- rev cx Reviewed hospital records, lab results and studies in detail   Alert if no further improvement

## 2018-05-12 NOTE — Assessment & Plan Note (Signed)
Reviewed bp readings from home- tend to be low and one caused symptoms of hypotension  Unsure if related to her amnesia episode  Will cut her avapro 300 mg to 150 daily  She will monitor at home and alert [place] if high or low bp Lifestyle change discussed

## 2018-05-12 NOTE — Assessment & Plan Note (Signed)
Reviewed hospital records, lab results and studies in detail  Brief and resolved quickly in hosp  W/u for TIA fairly normal  Very reassuring imaging/labs/echo/carotid study Feels normal now  Some low bp at home-will cut avapro in 1/2 and update  Disc stressor (daughter moving)- may or may not have played a role Will watch closely  Continue asa daily

## 2018-05-12 NOTE — Assessment & Plan Note (Signed)
Seen incidentally on a carotid study  Will order dedicated thyroid US Lab Results  Component Value Date   TSH 1.06 04/21/2018   No symptoms

## 2018-05-13 ENCOUNTER — Telehealth: Payer: Self-pay | Admitting: *Deleted

## 2018-05-13 ENCOUNTER — Encounter: Payer: Self-pay | Admitting: Family Medicine

## 2018-05-13 NOTE — Telephone Encounter (Signed)
For a few days that is ok- do not drive on it and be careful of sedation

## 2018-05-13 NOTE — Telephone Encounter (Signed)
Pt said that she is feeling like she isn't handling the experience in the hospital very well, every so often she is having this feeling like someone has scared her. Pt wants to know is it all right if she takes a half of a Clorazepate for a few days until she calms down

## 2018-05-13 NOTE — Telephone Encounter (Signed)
Pt.notified

## 2018-05-17 ENCOUNTER — Ambulatory Visit
Admission: RE | Admit: 2018-05-17 | Discharge: 2018-05-17 | Disposition: A | Discharge status: Home / Self Care | Payer: Medicare Other | Source: Ambulatory Visit | Attending: Family Medicine | Admitting: Family Medicine

## 2018-05-17 DIAGNOSIS — E042 Nontoxic multinodular goiter: Secondary | ICD-10-CM | POA: Diagnosis not present

## 2018-05-17 DIAGNOSIS — E041 Nontoxic single thyroid nodule: Secondary | ICD-10-CM | POA: Diagnosis not present

## 2018-05-20 ENCOUNTER — Telehealth: Payer: Self-pay | Admitting: Family Medicine

## 2018-05-20 ENCOUNTER — Telehealth: Payer: Self-pay

## 2018-05-20 DIAGNOSIS — E041 Nontoxic single thyroid nodule: Secondary | ICD-10-CM

## 2018-05-20 NOTE — Telephone Encounter (Signed)
Thyroid biopsy appointment made for 05/31/18 at 4 pm with [facility] and patient aware of all the information-Tammy Manning, RMA

## 2018-05-20 NOTE — Telephone Encounter (Signed)
I put the order in and will route to [facility] Thanks

## 2018-05-20 NOTE — Telephone Encounter (Signed)
Spoke with Tammy Manning office and Tammy Manning. Tammy Manning will call [facility] to set up this appointment. Provided all the information to the Tammy Manning. [facility] prefers speaking to patients instead of [place] making the appointment.-Anastasiya [employer], RMA

## 2018-05-20 NOTE — Telephone Encounter (Signed)
-----   Message from Tammi Sou, [place] sent at 05/18/2018 10:55 AM EDT ----- Pt notified of [place] results and Dr. Marliss Coots recommendations. Pt agrees with order for a needle biopsy, I advise pt our [facility] will call to schedule appt

## 2018-05-25 ENCOUNTER — Inpatient Hospital Stay: Payer: Medicare Other | Admitting: Family Medicine

## 2018-05-31 ENCOUNTER — Ambulatory Visit
Admission: RE | Admit: 2018-05-31 | Discharge: 2018-05-31 | Disposition: A | Discharge status: Home / Self Care | Payer: Medicare Other | Source: Ambulatory Visit | Attending: Family Medicine | Admitting: Family Medicine

## 2018-05-31 ENCOUNTER — Other Ambulatory Visit (HOSPITAL_COMMUNITY)
Admission: RE | Admit: 2018-05-31 | Discharge: 2018-05-31 | Disposition: A | Discharge status: Home / Self Care | Payer: Medicare Other | Source: Ambulatory Visit | Attending: Physician Assistant | Admitting: Physician Assistant

## 2018-05-31 DIAGNOSIS — E041 Nontoxic single thyroid nodule: Secondary | ICD-10-CM

## 2018-05-31 NOTE — Procedures (Signed)
PROCEDURE SUMMARY:  Using direct ultrasound guidance, 5 passes were made using 25 g needles into the nodule within the right lobe of the thyroid.   Ultrasound was used to confirm needle placements on all occasions.   Specimens were sent to Pathology for analysis.  See procedure note under Imaging tab in Epic for full procedure details.  Chima Astorino S Christal Lagerstrom PA-C 05/31/2018 4:05 PM

## 2018-06-01 DIAGNOSIS — K5010 Crohn's disease of large intestine without complications: Secondary | ICD-10-CM | POA: Insufficient documentation

## 2018-06-01 DIAGNOSIS — K5730 Diverticulosis of large intestine without perforation or abscess without bleeding: Secondary | ICD-10-CM | POA: Diagnosis not present

## 2018-06-01 DIAGNOSIS — I2510 Atherosclerotic heart disease of native coronary artery without angina pectoris: Secondary | ICD-10-CM | POA: Diagnosis not present

## 2018-06-05 ENCOUNTER — Telehealth: Payer: Self-pay | Admitting: Family Medicine

## 2018-06-05 DIAGNOSIS — E041 Nontoxic single thyroid nodule: Secondary | ICD-10-CM

## 2018-06-05 NOTE — Telephone Encounter (Signed)
Ref done  Will route to [facility] 

## 2018-06-05 NOTE — Telephone Encounter (Signed)
-----   Message from Tammi Sou, [place] sent at 06/03/2018  4:13 PM EDT ----- Pt notified of biopsy results and Dr. Marliss Coots comments. Pt agrees with referral to endo. Pt said if possible she would like to see someone in [place], but if not she will travel to [place], Please put referral in and I advised pt our [facility] will call her to schedule appt

## 2018-06-06 NOTE — Telephone Encounter (Signed)
See below

## 2018-06-06 NOTE — Telephone Encounter (Signed)
Referral information sent over to [facility] endocrinology for review and patient advised of this and that their office will reach out to her directly after reviewing referral-Barth Trella V Avnoor Koury, RMA

## 2018-06-12 IMAGING — CR DG CHEST 2V
1 series · 2 of 2 positions shown · non-contrast
Comparison: 08/30/2015.

CLINICAL DATA: Cardiac catheterization.

EXAM:
CHEST  2 VIEW

[Series 1: dg chest 2 view · 0.14mm/px · 2 of 2 slices shown]
[im 1/2]
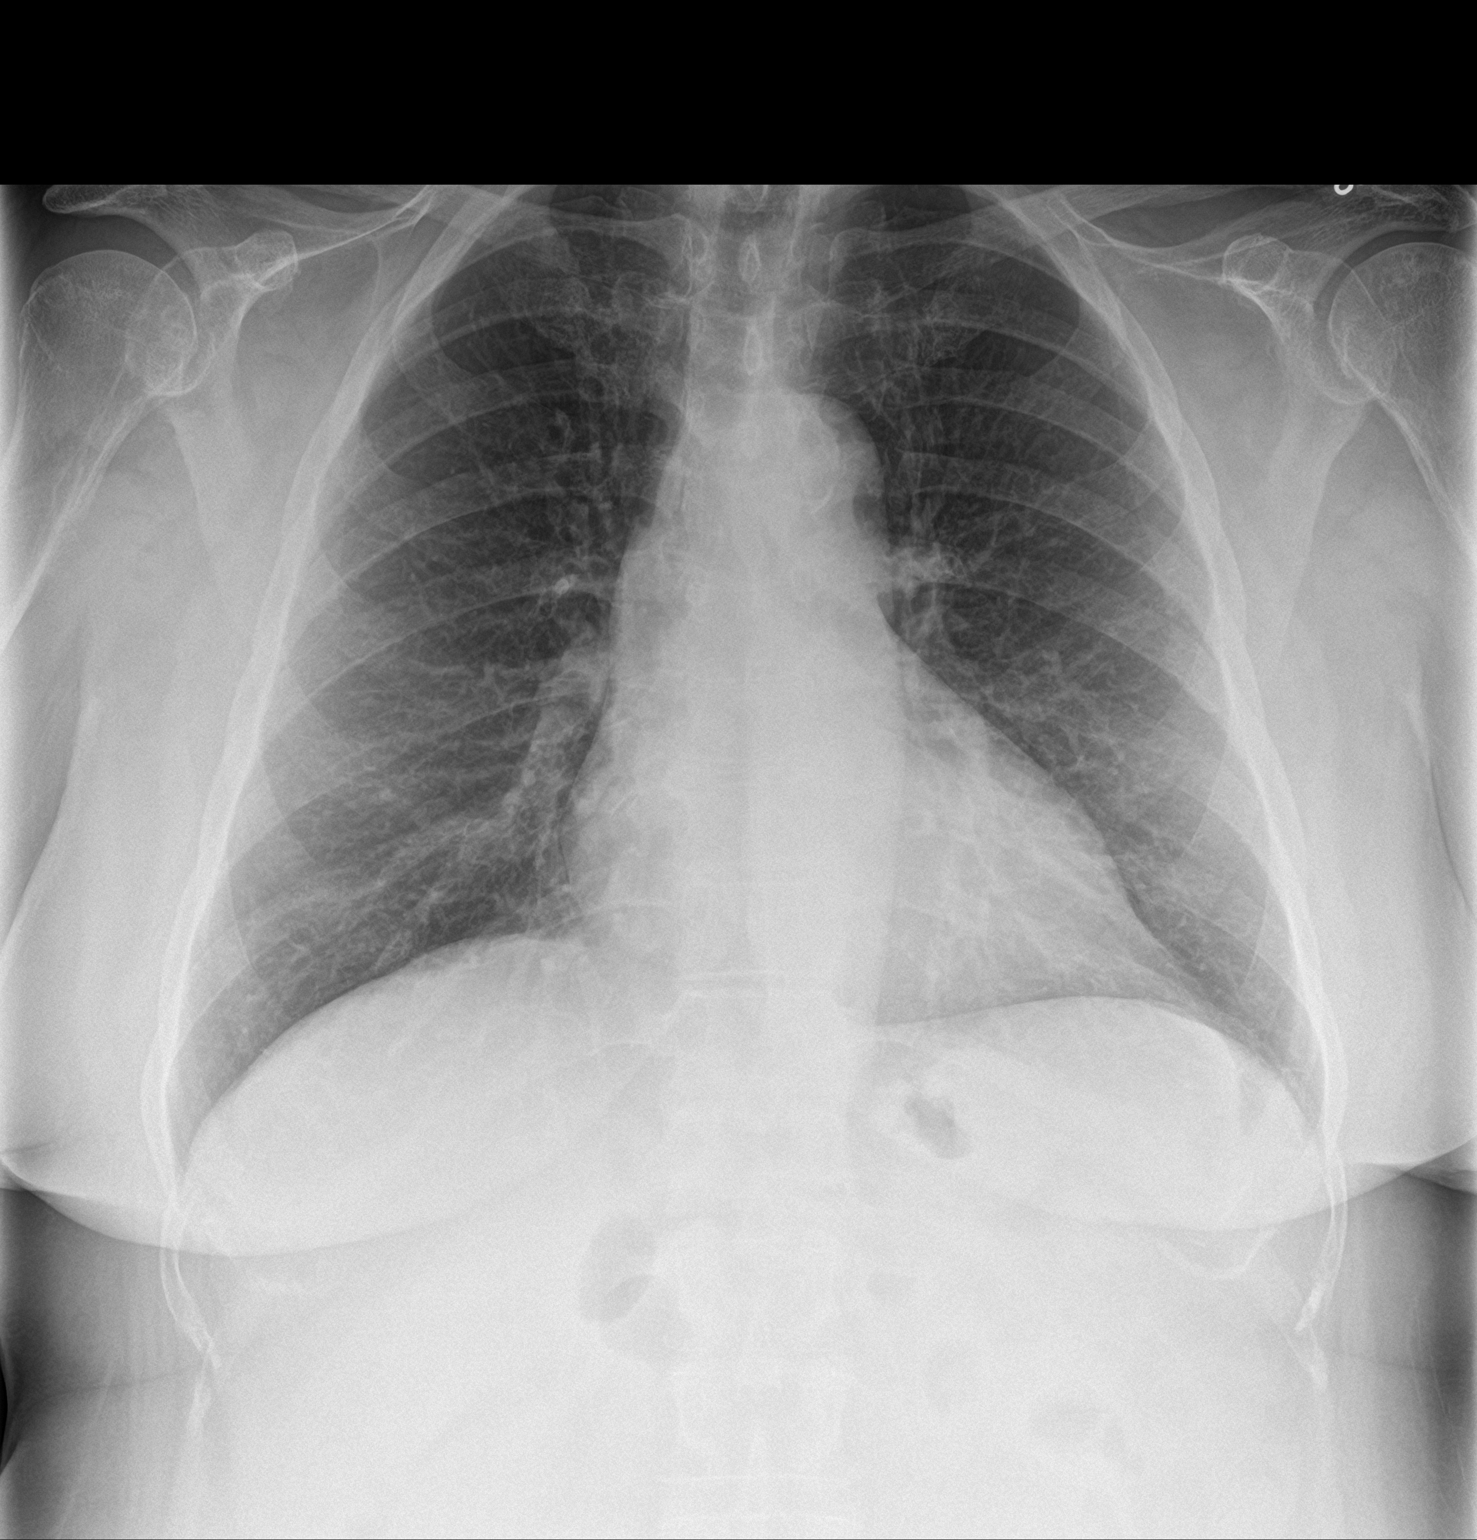
[im 2/2]
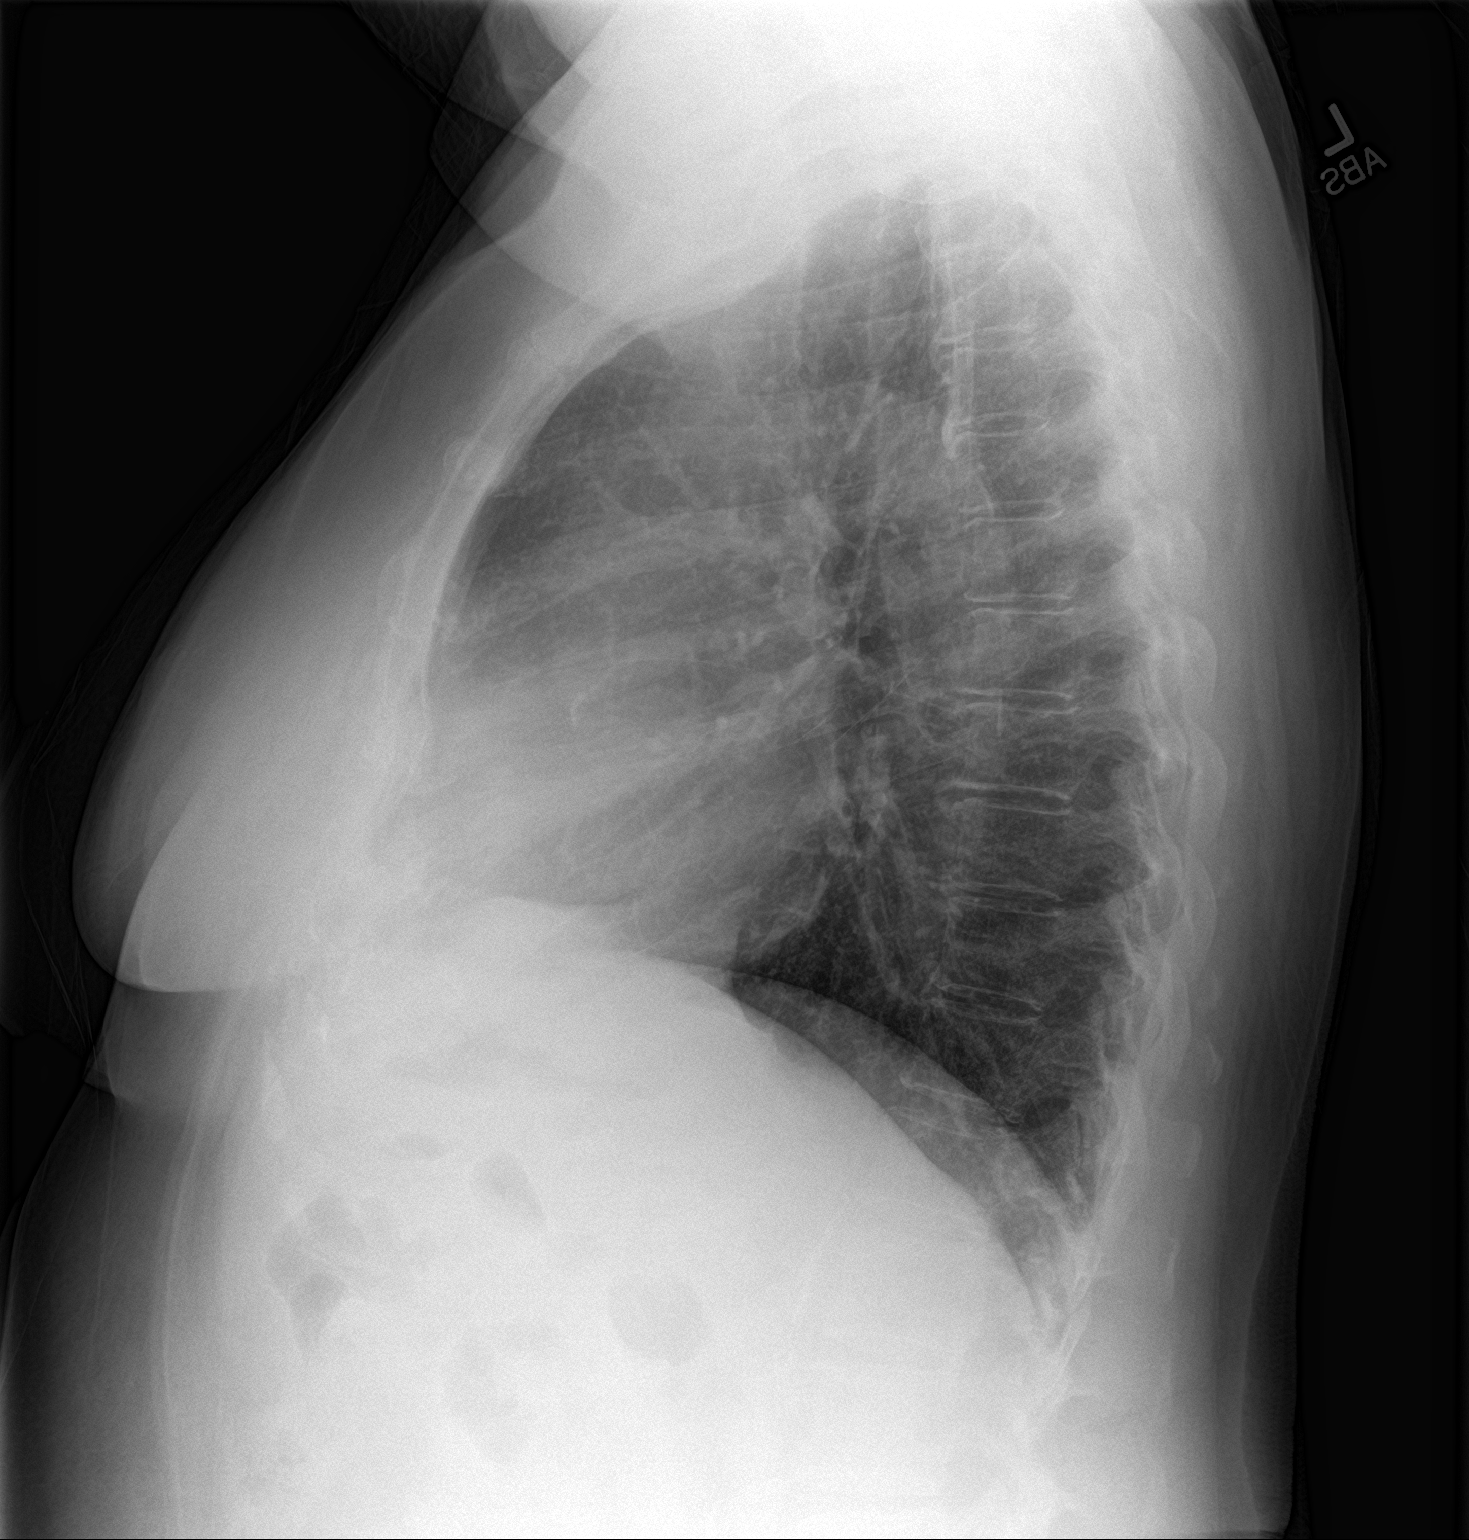

[2 of 2 positions shown; findings below may reference images not displayed]

FINDINGS: Mediastinum and hilar structures normal. Lungs are clear.
Cardiomegaly with normal pulmonary vascularity. No pleural effusion
or pneumothorax. No acute bony abnormality .
IMPRESSION: No acute cardiopulmonary disease.

## 2018-07-01 DIAGNOSIS — H35363 Drusen (degenerative) of macula, bilateral: Secondary | ICD-10-CM | POA: Diagnosis not present

## 2018-07-11 ENCOUNTER — Encounter: Payer: Self-pay | Admitting: Family Medicine

## 2018-07-11 MED ORDER — CLOBETASOL PROPIONATE 0.05 % EX OINT: 1.0000 "application " | TOPICAL_OINTMENT | Freq: Two times a day (BID) | CUTANEOUS | 1 refills | Status: DC

## 2018-07-11 NOTE — Telephone Encounter (Signed)
Clobetasol refill

## 2018-08-01 ENCOUNTER — Other Ambulatory Visit (HOSPITAL_COMMUNITY)
Admission: RE | Admit: 2018-08-01 | Discharge: 2018-08-01 | Disposition: A | Discharge status: Home / Self Care | Payer: Medicare Other | Source: Ambulatory Visit | Attending: Endocrinology | Admitting: Endocrinology

## 2018-08-01 ENCOUNTER — Encounter: Payer: Self-pay | Admitting: Endocrinology

## 2018-08-01 ENCOUNTER — Ambulatory Visit (INDEPENDENT_AMBULATORY_CARE_PROVIDER_SITE_OTHER): Payer: Medicare Other | Admitting: Endocrinology

## 2018-08-01 VITALS — BP 124/68 | HR 73 | Ht 64.25 in | Wt 195.2 lb

## 2018-08-01 DIAGNOSIS — E041 Nontoxic single thyroid nodule: Secondary | ICD-10-CM | POA: Insufficient documentation

## 2018-08-01 DIAGNOSIS — K5010 Crohn's disease of large intestine without complications: Secondary | ICD-10-CM | POA: Diagnosis not present

## 2018-08-01 DIAGNOSIS — K5730 Diverticulosis of large intestine without perforation or abscess without bleeding: Secondary | ICD-10-CM | POA: Diagnosis not present

## 2018-08-01 NOTE — Progress Notes (Signed)
Subjective:    Patient ID: Tammy Manning, female    DOB: 11/25/1934, 83 y.o.   MRN: [id number]  HPI Pt is referred by Dr Glori Bickers, for nodular thyroid.  Pt was noted to have a nodule at the right thyroid area in mid-2019.  she is unaware of ever having had thyroid problems in the past.  she has no h/o XRT or surgery to the neck.  She has moderate weight loss, but no assoc dysphagia.  Past Medical History:  Diagnosis Date  . Anginal pain ([place])   . Arthritis    Knees  . B12 deficiency    Mild  . Bucket-handle tear of lateral meniscus of left knee as current injury 12/30/2012  . Cancer ([facility])    Skin, basal cell on nose and eyelid  . DJD (degenerative joint disease)    Low back  . Dyspepsia   . GERD (gastroesophageal reflux disease)   . Heart murmur   . Heart palpitations   . HOH (hard of hearing)   . Hyperlipidemia   . Hypertension   . Lactose intolerance   . Mixed incontinence   . PONV (postoperative nausea and vomiting)   . Vulvar irritation    from urine pad, uses Triamcinolone oint.    Past Surgical History:  Procedure Laterality Date  . ABDOMINAL AORTOGRAM N/A 03/15/2017   Procedure: Abdominal Aortogram;  Surgeon: Wellington Hampshire, MD;  Location: [facility];  Service: Cardiovascular;  Laterality: N/A;  . ABDOMINAL HYSTERECTOMY  1982   Large fibroids and bleeding  . APPENDECTOMY    . BILATERAL SALPINGOOPHORECTOMY  1986  . BONE GRAFT HIP ILIAC CREST     To Left arm  . BREAST REDUCTION SURGERY    . BREAST SURGERY    . CATARACT EXTRACTION W/PHACO Left 07/07/2016   Procedure: CATARACT EXTRACTION PHACO AND INTRAOCULAR LENS PLACEMENT (IOC);  Surgeon: Birder Robson, MD;  Location: [facility];  Service: Ophthalmology;  Laterality: Left;  [place] 01:10 AP% 18.3 CDE 12.91 Fluid pak lot # [id number]  . CATARACT EXTRACTION W/PHACO Right 07/28/2016   Procedure: CATARACT EXTRACTION PHACO AND INTRAOCULAR LENS PLACEMENT ([place]);  Surgeon: Birder Robson, MD;  Location: [facility];   Service: Ophthalmology;  Laterality: Right;  [place] 01:26 AP% 18.5 CDE 16.12 Fluid pack lot # [id number]  . COLONOSCOPY  07/2016   Dr. Thana Farr  . DILATION AND CURETTAGE OF UTERUS     miscarragesx2  . ESOPHAGOGASTRODUODENOSCOPY     Polyps  . [place]  2006  . KNEE ARTHROSCOPY WITH LATERAL MENISECTOMY Left 12/30/2012   Procedure: KNEE ARTHROSCOPY WITH LATERAL MENISECTOMY;  Surgeon: Johnny Bridge, MD;  Location: [facility];  Service: Orthopedics;  Laterality: Left;  LEFT KNEE SCOPE LATERAL MENISCECTOMY  . LEFT HEART CATH AND CORONARY ANGIOGRAPHY N/A 03/15/2017   Procedure: Left Heart Cath and Coronary Angiography;  Surgeon: Wellington Hampshire, MD;  Location: [facility];  Service: Cardiovascular;  Laterality: N/A;  . MOUTH SURGERY    . skin cancer eyelid  2012  . TONSILLECTOMY      Social History   Socioeconomic History  . Marital status: Married    Spouse name: Not on file  . Number of children: 2  . Years of education: Not on file  . Highest education level: Not on file  Occupational History  . Occupation: Architect: RETIRED  Social Needs  . Financial resource strain: Not on file  . Food insecurity:  Worry: Not on file    Inability: Not on file  . Transportation needs:    Medical: Not on file    Non-medical: Not on file  Tobacco Use  . Smoking status: Former Smoker    Packs/day: 1.00    Years: 12.00    Pack years: 12.00    Types: Cigarettes    Last attempt to quit: 11/24/1963    Years since quitting: 54.7  . Smokeless tobacco: Never Used  Substance and Sexual Activity  . Alcohol use: No    Alcohol/week: 0.0 standard drinks    Comment: Rare  . Drug use: No  . Sexual activity: Yes  Lifestyle  . Physical activity:    Days per week: Not on file    Minutes per session: Not on file  . Stress: Not on file  Relationships  . Social connections:    Talks on phone: Not on file    Gets together: Not on file    Attends  religious service: Not on file    Active member of club or organization: Not on file    Attends meetings of clubs or organizations: Not on file    Relationship status: Not on file  . Intimate partner violence:    Fear of current or ex partner: Not on file    Emotionally abused: Not on file    Physically abused: Not on file    Forced sexual activity: Not on file  Other Topics Concern  . Not on file  Social History Narrative   Metallurgist.      Current Outpatient Medications on File Prior to Visit  Medication Sig Dispense Refill  . acetaminophen (TYLENOL) 500 MG tablet Take 500 mg by mouth every 6 (six) hours as needed.    [name] Kitchen aspirin EC 81 MG tablet Take 1 tablet (81 mg total) by mouth daily. 30 tablet 2  . atorvastatin (LIPITOR) 10 MG tablet TAKE 1 TABLET(10 MG) BY MOUTH DAILY AT 6 PM 90 tablet 3  . Calcium Carbonate-Vitamin D (CALCIUM 600+D PO) Take 1 tablet by mouth daily.     . chlorthalidone (HYGROTON) 25 MG tablet Take 12.5 mg by mouth 3 (three) times a week. Take 1/2 tablet every two days.     . clobetasol ointment (TEMOVATE) 1.19 % Apply 1 application topically 2 (two) times daily. To affected area 30 g 1  . clorazepate (TRANXENE) 7.5 MG tablet Take 1 tablet (7.5 mg total) by mouth daily as needed. 30 tablet 0  . cyanocobalamin (,VITAMIN B-12,) 1000 MCG/ML injection Inject 1,000 mcg into the muscle every 30 (thirty) days.     . irbesartan (AVAPRO) 300 MG tablet Take 0.5 tablets (150 mg total) by mouth daily. 45 tablet 3  . omeprazole (PRILOSEC) 20 MG capsule TAKE 1 CAPSULE(20 MG) BY MOUTH DAILY AS NEEDED FOR HEARTBURN 90 capsule 3  . APRISO 0.375 g 24 hr capsule Take 3 capsules by mouth daily.  0   No current facility-administered medications on file prior to visit.     Allergies  Allergen Reactions  . Amlodipine Besylate Other (See Comments)    Reaction:  Increases BP  . Antihistamines, Chlorpheniramine-Type Other (See Comments)    Reaction:  Makes pt hyper   . Atenolol  Hypertension  . Cefuroxime Axetil Other (See Comments)    Upset stomach  . Chlorthalidone Other (See Comments)  . Ciprofloxacin Other (See Comments)    Upset stomach  . Co Q 10 [Coenzyme Q10] Other (See Comments)  Reaction:  GI upset   . Crestor [Rosuvastatin Calcium] Other (See Comments)    Reaction:  Myalgias   . Dextromethorphan-Guaifenesin Other (See Comments)    Reaction:  Made pt jittery   . Epinephrine Hives  . Other     Reaction:  Makes pt hyper   . Ramipril Other (See Comments)    Upset stomach  . Sulfamethoxazole-Trimethoprim Other (See Comments)    Upset stomach  . Vitamin D Analogs Other (See Comments)    Reaction:  GI upset     Family History  Problem Relation Age of Onset  . Cancer Mother        Colon  . Heart disease Father        CAD  . Diabetes Sister   . Hypothyroidism Sister   . Diabetes Brother   . Leukemia Brother   . Cancer Paternal Aunt        Breast  . Diabetes Sister     BP 124/68 (BP Location: Left Arm)   Pulse 73   Ht 5' 4.25" (1.632 m)   Wt 195 lb 3.2 oz (88.5 kg)   SpO2 98%   BMI 33.25 kg/m    Review of Systems Denies hoarseness, neck pain, diplopia, chest pain, sob, cough, diarrhea, itching, flushing, easy bruising, depression, numbness, and rhinorrhea.  She has fatigue, cold intolerance, and headache.     Objective:   Physical Exam VS: see vs page GEN: no distress HEAD: head: no deformity eyes: no periorbital swelling, no proptosis external nose and ears are normal mouth: no lesion seen NECK: the right thyroid nodule is palpable, and is freely mobile.   CHEST WALL: no deformity LUNGS: clear to auscultation CV: reg rate and rhythm, no murmur ABD: abdomen is soft, nontender.  no hepatosplenomegaly.  not distended.  no hernia MUSCULOSKELETAL: muscle bulk and strength are grossly normal.  no obvious joint swelling.  gait is normal and steady EXTEMITIES: no deformity.  no ulcer on the feet.  feet are of normal color and  temp.  no edema PULSES: dorsalis pedis intact bilat.  no carotid bruit NEURO:  cn 2-12 grossly intact.   readily moves all 4's.  sensation is intact to touch on the feet SKIN:  Normal texture and temperature.  No rash or suspicious lesion is visible.   NODES:  None palpable at the neck PSYCH: alert, well-oriented.  Does not appear anxious nor depressed.  I have reviewed outside records, and summarized: Pt was noted to have multinodular goiter, and referred here. The goiter was incidentally noted on carotid US.  US-guided bx was ordered.     Lab Results  Component Value Date   TSH 1.06 04/21/2018   [place]: 3.1 cm right inferior TR 3 nodule meets criteria for biopsy  US-guided bx: RLP (SPECIMEN 1 OF 1, COLLECTED ON 06/27/8849):  SCANT FOLLICULAR EPITHELIUM PRESENT ([place] CATEGORY I).    thyroid needle bx: consent obtained, signed form on chart The area is first sprayed with cooling agent local: xylocaine 2%, with epinephrine prep: alcohol pad 4 bxs are done with 25 and 27X needles no complications     Assessment & Plan:  Multinodular goiter, new to me, s/p bx today.   Patient Instructions  We'll let you know about the biopsy results. If no cancer is found, please come back for a follow-up appointment in 6 months.

## 2018-08-01 NOTE — Patient Instructions (Signed)
We'll let you know about the biopsy results. If no cancer is found, please come back for a follow-up appointment in 6 months.

## 2018-08-02 DIAGNOSIS — Z23 Encounter for immunization: Secondary | ICD-10-CM | POA: Diagnosis not present

## 2018-08-08 ENCOUNTER — Telehealth: Payer: Self-pay | Admitting: *Deleted

## 2018-08-08 NOTE — Telephone Encounter (Signed)
She can go ahead and start back

## 2018-08-08 NOTE — Telephone Encounter (Signed)
Copied from [place] [id number]. Topic: Appointment Scheduling - Scheduling Inquiry for Clinic >> Aug 08, 2018 10:19 AM Mylinda Latina, NT wrote: Patient called and states she needs a B12 injection schedule. She states is has been a few months since she has gotten it . Please call to schedule CB# [phone] can leave detailed message if patient does not answer

## 2018-08-08 NOTE — Telephone Encounter (Signed)
Is it ok for pt to restart b12 injections?

## 2018-08-08 NOTE — Telephone Encounter (Signed)
I left a detailed message on patient's voice mail and I put in a CRM.

## 2018-08-09 ENCOUNTER — Ambulatory Visit (INDEPENDENT_AMBULATORY_CARE_PROVIDER_SITE_OTHER): Payer: Medicare Other

## 2018-08-09 DIAGNOSIS — E538 Deficiency of other specified B group vitamins: Secondary | ICD-10-CM | POA: Diagnosis not present

## 2018-08-09 MED ORDER — CYANOCOBALAMIN 1000 MCG/ML IJ SOLN
1000.0000 ug | Freq: Once | INTRAMUSCULAR | Status: AC
  Administered 2018-08-09: 1000 ug via INTRAMUSCULAR

## 2018-08-09 NOTE — Progress Notes (Signed)
Per orders of Dr. Glori Bickers, injection of Vitamn B 12 given by Ozzie Hoyle. Patient tolerated injection well.

## 2018-08-16 ENCOUNTER — Encounter: Payer: Self-pay | Admitting: Family Medicine

## 2018-08-16 ENCOUNTER — Ambulatory Visit (INDEPENDENT_AMBULATORY_CARE_PROVIDER_SITE_OTHER): Payer: Medicare Other | Admitting: Family Medicine

## 2018-08-16 VITALS — BP 140/68 | HR 71 | Temp 98.2°F | Ht 64.25 in | Wt 194.5 lb

## 2018-08-16 DIAGNOSIS — J069 Acute upper respiratory infection, unspecified: Secondary | ICD-10-CM | POA: Insufficient documentation

## 2018-08-16 DIAGNOSIS — I10 Essential (primary) hypertension: Secondary | ICD-10-CM | POA: Diagnosis not present

## 2018-08-16 DIAGNOSIS — B9789 Other viral agents as the cause of diseases classified elsewhere: Secondary | ICD-10-CM | POA: Diagnosis not present

## 2018-08-16 MED ORDER — HYDROCOD POLST-CPM POLST ER 10-8 MG/5ML PO SUER: 2.5000 mL | Freq: Two times a day (BID) | ORAL | 0 refills | Status: DC | PRN

## 2018-08-16 MED ORDER — IRBESARTAN 150 MG PO TABS: 150.0000 mg | ORAL_TABLET | Freq: Every day | ORAL | 3 refills | Status: DC

## 2018-08-16 MED ORDER — AMOXICILLIN 500 MG PO CAPS: 500.0000 mg | ORAL_CAPSULE | Freq: Three times a day (TID) | ORAL | 0 refills | Status: DC

## 2018-08-16 NOTE — Assessment & Plan Note (Signed)
Refilled avapro 150   bp up a bit due to cough today  Has been well controlled

## 2018-08-16 NOTE — Patient Instructions (Addendum)
Drink lots of fluids  Rest also  Tylenol for headache - also a cool compress on your head Watch for fever  mucinex for congestion if needed   Take tussionex for severe cough if needed   If worse sinus pain/headache or no improvement in 3-4 days- fill px for amoxicillin   Feel better soon!

## 2018-08-16 NOTE — Progress Notes (Signed)
Subjective:    Patient ID: Tammy Manning, female    DOB: Dec 05, 1934, 83 y.o.   MRN: [id number]  HPI  Here for uri symptoms   Started feeling achey on Thursday  Did not take temp   The next day = started with cough and head ache (dry cough -feels phlegm in her throat)  Frontal headache  Also congestion   Eyes watering  Sneezing   Mucous- is yellow she she gets something out   mucinex otc prn   No wheezing   bp is up a little (from coughing)-needs avapro refilled  No cp or palpitations or headaches or edema  No side effects to medicines  BP Readings from Last 3 Encounters:  08/16/18 140/68  08/01/18 124/68  05/12/18 130/66     Patient Active Problem List   Diagnosis Date Noted  . Viral URI with cough 08/16/2018  . Transient global amnesia 05/12/2018  . Right thyroid nodule 05/12/2018  . TIA (transient ischemic attack) 05/09/2018  . Angina, class II ([place]) 09/10/2015  . Aortic insufficiency 09/10/2015  . Stress reaction 09/08/2015  . Encounter for Medicare annual wellness exam 02/28/2013  . Bucket-handle tear of lateral meniscus of left knee as current injury 12/30/2012  . Episodic atrial fibrillation ([place]) 11/17/2012  . Special screening for malignant neoplasms, colon 01/21/2012  . Hearing loss of both ears 01/14/2012  . Adverse drug reaction 03/12/2011  . HEADACHE, CHRONIC 09/24/2010  . B12 deficiency 03/17/2010  . DYSPEPSIA 03/17/2010  . POSTMENOPAUSAL STATUS 08/20/2008  . Essential hypertension 07/19/2007  . Hyperlipidemia 06/24/2007  . CONSTIPATION 06/24/2007  . IBS 06/24/2007  . DERMATITIS, ATOPIC 06/24/2007  . SKIN CANCER, HX OF 06/24/2007   Past Medical History:  Diagnosis Date  . Anginal pain ([place])   . Arthritis    Knees  . B12 deficiency    Mild  . Bucket-handle tear of lateral meniscus of left knee as current injury 12/30/2012  . Cancer (HCC)    Skin, basal cell on nose and eyelid  . DJD (degenerative joint disease)    Low back  .  Dyspepsia   . GERD (gastroesophageal reflux disease)   . Heart murmur   . Heart palpitations   . HOH (hard of hearing)   . Hyperlipidemia   . Hypertension   . Lactose intolerance   . Mixed incontinence   . PONV (postoperative nausea and vomiting)   . Vulvar irritation    from urine pad, uses Triamcinolone oint.   Past Surgical History:  Procedure Laterality Date  . ABDOMINAL AORTOGRAM N/A 03/15/2017   Procedure: Abdominal Aortogram;  Surgeon: Wellington Hampshire, MD;  Location: [facility];  Service: Cardiovascular;  Laterality: N/A;  . ABDOMINAL HYSTERECTOMY  1982   Large fibroids and bleeding  . APPENDECTOMY    . BILATERAL SALPINGOOPHORECTOMY  1986  . BONE GRAFT HIP ILIAC CREST     To Left arm  . BREAST REDUCTION SURGERY    . BREAST SURGERY    . CATARACT EXTRACTION W/PHACO Left 07/07/2016   Procedure: CATARACT EXTRACTION PHACO AND INTRAOCULAR LENS PLACEMENT (IOC);  Surgeon: Birder Robson, MD;  Location: [facility];  Service: Ophthalmology;  Laterality: Left;  [place] 01:10 AP% 18.3 CDE 12.91 Fluid pak lot # [id number]  . CATARACT EXTRACTION W/PHACO Right 07/28/2016   Procedure: CATARACT EXTRACTION PHACO AND INTRAOCULAR LENS PLACEMENT ([place]);  Surgeon: Birder Robson, MD;  Location: [facility];  Service: Ophthalmology;  Laterality: Right;  [place] 01:26 AP% 18.5 CDE 16.12 Fluid pack  lot # [id number]  . COLONOSCOPY  07/2016   Dr. Thana Farr  . DILATION AND CURETTAGE OF UTERUS     miscarragesx2  . ESOPHAGOGASTRODUODENOSCOPY     Polyps  . [place]  2006  . KNEE ARTHROSCOPY WITH LATERAL MENISECTOMY Left 12/30/2012   Procedure: KNEE ARTHROSCOPY WITH LATERAL MENISECTOMY;  Surgeon: Johnny Bridge, MD;  Location: [facility];  Service: Orthopedics;  Laterality: Left;  LEFT KNEE SCOPE LATERAL MENISCECTOMY  . LEFT HEART CATH AND CORONARY ANGIOGRAPHY N/A 03/15/2017   Procedure: Left Heart Cath and Coronary Angiography;  Surgeon: Wellington Hampshire, MD;  Location: [facility];  Service: Cardiovascular;  Laterality: N/A;  . MOUTH SURGERY    . skin cancer eyelid  2012  . TONSILLECTOMY     Social History   Tobacco Use  . Smoking status: Former Smoker    Packs/day: 1.00    Years: 12.00    Pack years: 12.00    Types: Cigarettes    Last attempt to quit: 11/24/1963    Years since quitting: 54.7  . Smokeless tobacco: Never Used  Substance Use Topics  . Alcohol use: No    Alcohol/week: 0.0 standard drinks    Comment: Rare  . Drug use: No   Family History  Problem Relation Age of Onset  . Cancer Mother        Colon  . Heart disease Father        CAD  . Diabetes Sister   . Hypothyroidism Sister   . Diabetes Brother   . Leukemia Brother   . Cancer Paternal Aunt        Breast  . Diabetes Sister    Allergies  Allergen Reactions  . Amlodipine Besylate Other (See Comments)    Reaction:  Increases BP  . Antihistamines, Chlorpheniramine-Type Other (See Comments)    Reaction:  Makes pt hyper   . Atenolol Hypertension  . Cefuroxime Axetil Other (See Comments)    Upset stomach  . Chlorthalidone Other (See Comments)  . Ciprofloxacin Other (See Comments)    Upset stomach  . Co Q 10 [Coenzyme Q10] Other (See Comments)    Reaction:  GI upset   . Crestor [Rosuvastatin Calcium] Other (See Comments)    Reaction:  Myalgias   . Dextromethorphan-Guaifenesin Other (See Comments)    Reaction:  Made pt jittery   . Epinephrine Hives  . Other     Reaction:  Makes pt hyper   . Ramipril Other (See Comments)    Upset stomach  . Sulfamethoxazole-Trimethoprim Other (See Comments)    Upset stomach  . Vitamin D Analogs Other (See Comments)    Reaction:  GI upset    Current Outpatient Medications on File Prior to Visit  Medication Sig Dispense Refill  . acetaminophen (TYLENOL) 500 MG tablet Take 500 mg by mouth every 6 (six) hours as needed.    [name] Kitchen aspirin EC 81 MG tablet Take 1 tablet (81 mg total) by mouth daily. 30 tablet 2  . atorvastatin (LIPITOR) 10 MG  tablet TAKE 1 TABLET(10 MG) BY MOUTH DAILY AT 6 PM 90 tablet 3  . Calcium Carbonate-Vitamin D (CALCIUM 600+D PO) Take 1 tablet by mouth daily.     . chlorthalidone (HYGROTON) 25 MG tablet Take 12.5 mg by mouth 3 (three) times a week. Take 1/2 tablet every two days.     . clobetasol ointment (TEMOVATE) 7.82 % Apply 1 application topically 2 (two) times daily. To affected area 30 g  1  . clorazepate (TRANXENE) 7.5 MG tablet Take 1 tablet (7.5 mg total) by mouth daily as needed. 30 tablet 0  . cyanocobalamin (,VITAMIN B-12,) 1000 MCG/ML injection Inject 1,000 mcg into the muscle every 30 (thirty) days.     [name] Kitchen omeprazole (PRILOSEC) 20 MG capsule TAKE 1 CAPSULE(20 MG) BY MOUTH DAILY AS NEEDED FOR HEARTBURN 90 capsule 3   No current facility-administered medications on file prior to visit.     Review of Systems  Constitutional: Positive for appetite change and fatigue. Negative for fever.  HENT: Positive for congestion, postnasal drip, rhinorrhea, sinus pressure, sneezing and sore throat. Negative for ear pain.   Eyes: Negative for pain and discharge.  Respiratory: Positive for cough. Negative for shortness of breath, wheezing and stridor.   Cardiovascular: Negative for chest pain.  Gastrointestinal: Negative for diarrhea, nausea and vomiting.  Genitourinary: Negative for frequency, hematuria and urgency.  Musculoskeletal: Negative for arthralgias and myalgias.  Skin: Negative for rash.  Neurological: Positive for headaches. Negative for dizziness, weakness and light-headedness.  Psychiatric/Behavioral: Negative for confusion and dysphoric mood.       Objective:   Physical Exam  Constitutional: She appears well-developed and well-nourished. No distress.  overwt and well appearing /but fatigued   HENT:  Head: Normocephalic and atraumatic.  Right Ear: External ear normal.  Left Ear: External ear normal.  Mouth/Throat: Oropharynx is clear and moist.  Nares are injected and congested  Mild  frontal sinus tenderness Clear rhinorrhea and post nasal drip   Eyes: Pupils are equal, round, and reactive to light. Conjunctivae and EOM are normal. Right eye exhibits no discharge. Left eye exhibits no discharge.  Neck: Normal range of motion. Neck supple.  Cardiovascular: Normal rate and normal heart sounds.  Pulmonary/Chest: Effort normal and breath sounds normal. No stridor. No respiratory distress. She has no wheezes. She has no rales. She exhibits no tenderness.  Good air exch Dry cough  Upper airway sounds noted   No rales/rhonchi/wheeze   Lymphadenopathy:    She has no cervical adenopathy.  Neurological: She is alert.  Skin: Skin is warm and dry. No rash noted.  Psychiatric: She has a normal mood and affect.          Assessment & Plan:   Problem List Items Addressed This Visit      Cardiovascular and Mediastinum   Essential hypertension    Refilled avapro 150   bp up a bit due to cough today  Has been well controlled       Relevant Medications   irbesartan (AVAPRO) 150 MG tablet     Respiratory   Viral URI with cough - Primary    Reassuring exam-no wheezing  Watch HA- if worse sinus pain/ will fill px for amoxicillin Disc symptomatic care - see instructions on AVS  tussionex prn cough- warned of sedation (this is one of the only cough medications she tolerates)  Fluids/rest Tylenol/ mucinex Update if not starting to improve in a week or if worsening

## 2018-08-16 NOTE — Assessment & Plan Note (Signed)
Reassuring exam-no wheezing  Watch HA- if worse sinus pain/ will fill px for amoxicillin Disc symptomatic care - see instructions on AVS  tussionex prn cough- warned of sedation (this is one of the only cough medications she tolerates)  Fluids/rest Tylenol/ mucinex Update if not starting to improve in a week or if worsening

## 2018-08-25 DIAGNOSIS — Z1231 Encounter for screening mammogram for malignant neoplasm of breast: Secondary | ICD-10-CM | POA: Diagnosis not present

## 2018-08-26 ENCOUNTER — Encounter: Payer: Self-pay | Admitting: Family Medicine

## 2018-09-18 ENCOUNTER — Other Ambulatory Visit: Payer: Self-pay | Admitting: Family Medicine

## 2018-09-19 MED ORDER — CLORAZEPATE DIPOTASSIUM 7.5 MG PO TABS: 7.5000 mg | ORAL_TABLET | Freq: Every day | ORAL | 0 refills | Status: DC | PRN

## 2018-09-19 NOTE — Telephone Encounter (Signed)
Name of Medication: clorazepate  Name of Pharmacy: [facility] or Written Date and Quantity: 03/14/18 #30 tabs with 0 refills Last Office Visit and Type: 08/16/18 Cough Next Office Visit and Type: CPE on 04/28/19 (AWV scheduled too) Last Controlled Substance Agreement Date: 06/2013 Last UDS:06/2013

## 2018-09-22 ENCOUNTER — Ambulatory Visit (INDEPENDENT_AMBULATORY_CARE_PROVIDER_SITE_OTHER): Payer: Medicare Other

## 2018-09-22 DIAGNOSIS — E538 Deficiency of other specified B group vitamins: Secondary | ICD-10-CM | POA: Diagnosis not present

## 2018-09-22 MED ORDER — CYANOCOBALAMIN 1000 MCG/ML IJ SOLN
1000.0000 ug | Freq: Once | INTRAMUSCULAR | Status: AC
  Administered 2018-09-22: 1000 ug via INTRAMUSCULAR

## 2018-09-22 NOTE — Progress Notes (Addendum)
Pt given 71m B12 in Left Deltoid. Tolerated well.

## 2018-10-05 DIAGNOSIS — M7061 Trochanteric bursitis, right hip: Secondary | ICD-10-CM | POA: Diagnosis not present

## 2018-11-03 ENCOUNTER — Ambulatory Visit (INDEPENDENT_AMBULATORY_CARE_PROVIDER_SITE_OTHER): Payer: Medicare Other

## 2018-11-03 DIAGNOSIS — E538 Deficiency of other specified B group vitamins: Secondary | ICD-10-CM | POA: Diagnosis not present

## 2018-11-03 MED ORDER — CYANOCOBALAMIN 1000 MCG/ML IJ SOLN
1000.0000 ug | Freq: Once | INTRAMUSCULAR | Status: AC
  Administered 2018-11-03: 1000 ug via INTRAMUSCULAR

## 2018-11-03 NOTE — Progress Notes (Signed)
Per orders of Dr. Glori Bickers, injection of vit P92 given by Brenton Grills. Patient tolerated injection well.

## 2018-11-08 ENCOUNTER — Ambulatory Visit: Payer: Medicare Other | Admitting: Family Medicine

## 2018-11-09 ENCOUNTER — Ambulatory Visit (INDEPENDENT_AMBULATORY_CARE_PROVIDER_SITE_OTHER): Payer: Medicare Other | Admitting: Family Medicine

## 2018-11-09 ENCOUNTER — Encounter: Payer: Self-pay | Admitting: Family Medicine

## 2018-11-09 VITALS — BP 100/60 | HR 77 | Temp 97.4°F | Ht 64.25 in | Wt 194.0 lb

## 2018-11-09 DIAGNOSIS — M7061 Trochanteric bursitis, right hip: Secondary | ICD-10-CM | POA: Diagnosis not present

## 2018-11-09 MED ORDER — PREDNISONE 10 MG PO TABS: ORAL_TABLET | ORAL | 0 refills | Status: DC

## 2018-11-09 NOTE — Patient Instructions (Addendum)
Take a look at the info on trochanteric bursitis and exercises  Take the prednisone as directed  Avoid otc anti inflammatories  Tylenol is ok   Heat/ice -if helpful   If not improving - call the orthopedic office -Dr Mardelle Matte

## 2018-11-09 NOTE — Progress Notes (Signed)
Subjective:    Patient ID: Tammy Manning, female    DOB: [birth year], 83 y.o.   MRN: [id number]  HPI Here for R leg and hip pain   Ongoing Saw ortho and chiropractor No cause sound?   Causing gait problems   Wt Readings from Last 3 Encounters:  11/09/18 194 lb (88 kg)  08/16/18 194 lb 8 oz (88.2 kg)  08/01/18 195 lb 3.2 oz (88.5 kg)   33.04 kg/m   Saw Dr Mardelle Matte last mo  Had injection and steroid pack  Had relief for 1-2 weeks  She did not call their office for   She had been exercising more - thinks R leg is shorter She feels it when she walks  She was doing a lot of stretching -when pain started  Now pain has moved into her groin and inner thigh   Hip film was neg LS showed deg change low   Took some aleve every other day  Patient Active Problem List   Diagnosis Date Noted  . Trochanteric bursitis of right hip 11/09/2018  . Transient global amnesia 05/12/2018  . Right thyroid nodule 05/12/2018  . TIA (transient ischemic attack) 05/09/2018  . Angina, class II ([place]) 09/10/2015  . Aortic insufficiency 09/10/2015  . Stress reaction 09/08/2015  . Encounter for Medicare annual wellness exam 02/28/2013  . Bucket-handle tear of lateral meniscus of left knee as current injury 12/30/2012  . Episodic atrial fibrillation ([place]) 11/17/2012  . Special screening for malignant neoplasms, colon 01/21/2012  . Hearing loss of both ears 01/14/2012  . Adverse drug reaction 03/12/2011  . HEADACHE, CHRONIC 09/24/2010  . B12 deficiency 03/17/2010  . DYSPEPSIA 03/17/2010  . POSTMENOPAUSAL STATUS 08/20/2008  . Essential hypertension 07/19/2007  . Hyperlipidemia 06/24/2007  . CONSTIPATION 06/24/2007  . IBS 06/24/2007  . DERMATITIS, ATOPIC 06/24/2007  . SKIN CANCER, HX OF 06/24/2007   Past Medical History:  Diagnosis Date  . Anginal pain ([place])   . Arthritis    Knees  . B12 deficiency    Mild  . Bucket-handle tear of lateral meniscus of left knee as current injury 12/30/2012    . Cancer (HCC)    Skin, basal cell on nose and eyelid  . DJD (degenerative joint disease)    Low back  . Dyspepsia   . GERD (gastroesophageal reflux disease)   . Heart murmur   . Heart palpitations   . HOH (hard of hearing)   . Hyperlipidemia   . Hypertension   . Lactose intolerance   . Mixed incontinence   . PONV (postoperative nausea and vomiting)   . Vulvar irritation    from urine pad, uses Triamcinolone oint.   Past Surgical History:  Procedure Laterality Date  . ABDOMINAL AORTOGRAM N/A 03/15/2017   Procedure: Abdominal Aortogram;  Surgeon: Wellington Hampshire, MD;  Location: [place] [facility];  Service: Cardiovascular;  Laterality: N/A;  . ABDOMINAL HYSTERECTOMY  1982   Large fibroids and bleeding  . APPENDECTOMY    . BILATERAL SALPINGOOPHORECTOMY  1986  . BONE GRAFT HIP ILIAC CREST     To Left arm  . BREAST REDUCTION SURGERY    . BREAST SURGERY    . CATARACT EXTRACTION W/PHACO Left 07/07/2016   Procedure: CATARACT EXTRACTION PHACO AND INTRAOCULAR LENS PLACEMENT (IOC);  Surgeon: Birder Robson, MD;  Location: [facility];  Service: Ophthalmology;  Laterality: Left;  [place] 01:10 AP% 18.3 CDE 12.91 Fluid pak lot # [id number]  . CATARACT EXTRACTION W/PHACO Right 07/28/2016  Procedure: CATARACT EXTRACTION PHACO AND INTRAOCULAR LENS PLACEMENT (IOC);  Surgeon: Birder Robson, MD;  Location: [facility];  Service: Ophthalmology;  Laterality: Right;  [place] 01:26 AP% 18.5 CDE 16.12 Fluid pack lot # [id number]  . COLONOSCOPY  07/2016   Dr. Thana Farr  . DILATION AND CURETTAGE OF UTERUS     miscarragesx2  . ESOPHAGOGASTRODUODENOSCOPY     Polyps  . [place]  2006  . KNEE ARTHROSCOPY WITH LATERAL MENISECTOMY Left 12/30/2012   Procedure: KNEE ARTHROSCOPY WITH LATERAL MENISECTOMY;  Surgeon: Johnny Bridge, MD;  Location: [facility];  Service: Orthopedics;  Laterality: Left;  LEFT KNEE SCOPE LATERAL MENISCECTOMY  . LEFT HEART CATH AND CORONARY ANGIOGRAPHY N/A 03/15/2017    Procedure: Left Heart Cath and Coronary Angiography;  Surgeon: Wellington Hampshire, MD;  Location: [place] [facility];  Service: Cardiovascular;  Laterality: N/A;  . MOUTH SURGERY    . skin cancer eyelid  2012  . TONSILLECTOMY     Social History   Tobacco Use  . Smoking status: Former Smoker    Packs/day: 1.00    Years: 12.00    Pack years: 12.00    Types: Cigarettes    Last attempt to quit: 11/24/1963    Years since quitting: 54.9  . Smokeless tobacco: Never Used  Substance Use Topics  . Alcohol use: No    Alcohol/week: 0.0 standard drinks    Comment: Rare  . Drug use: No   Family History  Problem Relation Age of Onset  . Cancer Mother        Colon  . Heart disease Father        CAD  . Diabetes Sister   . Hypothyroidism Sister   . Diabetes Brother   . Leukemia Brother   . Cancer Paternal Aunt        Breast  . Diabetes Sister    Allergies  Allergen Reactions  . Amlodipine Besylate Other (See Comments)    Reaction:  Increases BP  . Antihistamines, Chlorpheniramine-Type Other (See Comments)    Reaction:  Makes pt hyper   . Atenolol Hypertension  . Cefuroxime Axetil Other (See Comments)    Upset stomach  . Chlorthalidone Other (See Comments)  . Ciprofloxacin Other (See Comments)    Upset stomach  . Co Q 10 [Coenzyme Q10] Other (See Comments)    Reaction:  GI upset   . Crestor [Rosuvastatin Calcium] Other (See Comments)    Reaction:  Myalgias   . Dextromethorphan-Guaifenesin Other (See Comments)    Reaction:  Made pt jittery   . Epinephrine Hives  . Other     Reaction:  Makes pt hyper   . Ramipril Other (See Comments)    Upset stomach  . Sulfamethoxazole-Trimethoprim Other (See Comments)    Upset stomach  . Vitamin D Analogs Other (See Comments)    Reaction:  GI upset    Current Outpatient Medications on File Prior to Visit  Medication Sig Dispense Refill  . acetaminophen (TYLENOL) 500 MG tablet Take 500 mg by mouth every 6 (six) hours as needed.    [name] Kitchen  aspirin EC 81 MG tablet Take 1 tablet (81 mg total) by mouth daily. 30 tablet 2  . atorvastatin (LIPITOR) 10 MG tablet TAKE 1 TABLET(10 MG) BY MOUTH DAILY AT 6 PM 90 tablet 3  . Calcium Carbonate-Vitamin D (CALCIUM 600+D PO) Take 1 tablet by mouth daily.     . chlorthalidone (HYGROTON) 25 MG tablet Take 12.5 mg by mouth 3 (  three) times a week. Take 1/2 tablet every two days.     . clobetasol ointment (TEMOVATE) 1.15 % Apply 1 application topically 2 (two) times daily. To affected area 30 g 1  . clorazepate (TRANXENE) 7.5 MG tablet Take 1 tablet (7.5 mg total) by mouth daily as needed. 30 tablet 0  . cyanocobalamin (,VITAMIN B-12,) 1000 MCG/ML injection Inject 1,000 mcg into the muscle every 30 (thirty) days.     . irbesartan (AVAPRO) 150 MG tablet Take 1 tablet (150 mg total) by mouth daily. 90 tablet 3  . omeprazole (PRILOSEC) 20 MG capsule TAKE 1 CAPSULE(20 MG) BY MOUTH DAILY AS NEEDED FOR HEARTBURN 90 capsule 3   No current facility-administered medications on file prior to visit.     Review of Systems  Constitutional: Negative for activity change, appetite change, fatigue, fever and unexpected weight change.  HENT: Negative for congestion, ear pain, rhinorrhea, sinus pressure and sore throat.   Eyes: Negative for pain, redness and visual disturbance.  Respiratory: Negative for cough, shortness of breath and wheezing.   Cardiovascular: Negative for chest pain and palpitations.  Gastrointestinal: Negative for abdominal pain, blood in stool, constipation and diarrhea.  Endocrine: Negative for polydipsia and polyuria.  Genitourinary: Negative for dysuria, frequency and urgency.  Musculoskeletal: Positive for arthralgias and gait problem. Negative for back pain, joint swelling and myalgias.       Right leg and outer hip pain  Lateral and post thigh  Skin: Negative for pallor and rash.  Allergic/Immunologic: Negative for environmental allergies.  Neurological: Negative for dizziness, syncope,  weakness, numbness and headaches.  Hematological: Negative for adenopathy. Does not bruise/bleed easily.  Psychiatric/Behavioral: Negative for decreased concentration and dysphoric mood. The patient is not nervous/anxious.        Objective:   Physical Exam Constitutional:      Appearance: Normal appearance. She is normal weight.  HENT:     Head: Normocephalic and atraumatic.  Eyes:     Extraocular Movements: Extraocular movements intact.     Conjunctiva/sclera: Conjunctivae normal.     Pupils: Pupils are equal, round, and reactive to light.  Cardiovascular:     Rate and Rhythm: Normal rate and regular rhythm.  Pulmonary:     Effort: Pulmonary effort is normal.     Breath sounds: Normal breath sounds. No wheezing.  Abdominal:     General: Abdomen is flat. There is no distension.  Musculoskeletal:        General: Tenderness present. No swelling, deformity or signs of injury.     Right hip: She exhibits tenderness and bony tenderness. She exhibits normal range of motion, normal strength, no swelling, no crepitus and no deformity.     Lumbar back: She exhibits normal range of motion, no tenderness, no bony tenderness, no swelling, no deformity, no pain, no spasm and normal pulse.     Right lower leg: No edema.     Left lower leg: No edema.     Comments: Mild R greater trochanter tenderness Nl rom of both hips No pain on IT stretch  Neg SLR   No LS tenderness  Favors L leg to walk   Neurological:     Mental Status: She is alert.     Sensory: Sensation is intact.     Motor: No weakness or atrophy.     Comments: Neg SLR   Psychiatric:        Mood and Affect: Mood normal.  Assessment & Plan:   Problem List Items Addressed This Visit      Musculoskeletal and Integument   Trochanteric bursitis of right hip - Primary    Reviewed notes and xray reports from orthopedics (Dr Mardelle Matte)  Pt had brief (1-2 wk) imp from her injection  No lumbar pain  Some hamstring  discomfort and tightness , and nl rom hip (prev hip film nl) Plan from orthopedic was to tx with steroid taper if the injection did not hold  Px prendisone 30 mg taper today If no further imp - recommended she call orthopedic office and let us know as well  Rev some stretches today also and handouts given

## 2018-11-09 NOTE — Assessment & Plan Note (Signed)
Reviewed notes and xray reports from orthopedics (Dr Mardelle Matte)  Pt had brief (1-2 wk) imp from her injection  No lumbar pain  Some hamstring discomfort and tightness , and nl rom hip (prev hip film nl) Plan from orthopedic was to tx with steroid taper if the injection did not hold  Px prendisone 30 mg taper today If no further imp - recommended she call orthopedic office and let us know as well  Rev some stretches today also and handouts given

## 2018-12-12 DIAGNOSIS — M545 Low back pain: Secondary | ICD-10-CM | POA: Diagnosis not present

## 2018-12-12 DIAGNOSIS — M7061 Trochanteric bursitis, right hip: Secondary | ICD-10-CM | POA: Diagnosis not present

## 2018-12-13 DIAGNOSIS — M5136 Other intervertebral disc degeneration, lumbar region: Secondary | ICD-10-CM | POA: Diagnosis not present

## 2018-12-13 DIAGNOSIS — M25551 Pain in right hip: Secondary | ICD-10-CM | POA: Diagnosis not present

## 2018-12-15 ENCOUNTER — Ambulatory Visit: Payer: Medicare Other

## 2018-12-15 DIAGNOSIS — M25551 Pain in right hip: Secondary | ICD-10-CM | POA: Diagnosis not present

## 2018-12-15 DIAGNOSIS — M5136 Other intervertebral disc degeneration, lumbar region: Secondary | ICD-10-CM | POA: Diagnosis not present

## 2018-12-20 DIAGNOSIS — M25551 Pain in right hip: Secondary | ICD-10-CM | POA: Diagnosis not present

## 2018-12-20 DIAGNOSIS — M5136 Other intervertebral disc degeneration, lumbar region: Secondary | ICD-10-CM | POA: Diagnosis not present

## 2018-12-22 ENCOUNTER — Ambulatory Visit (INDEPENDENT_AMBULATORY_CARE_PROVIDER_SITE_OTHER): Payer: Medicare Other

## 2018-12-22 DIAGNOSIS — E538 Deficiency of other specified B group vitamins: Secondary | ICD-10-CM | POA: Diagnosis not present

## 2018-12-22 MED ORDER — CYANOCOBALAMIN 1000 MCG/ML IJ SOLN
1000.0000 ug | Freq: Once | INTRAMUSCULAR | Status: AC
  Administered 2018-12-22: 1000 ug via INTRAMUSCULAR

## 2018-12-22 NOTE — Progress Notes (Signed)
Per orders of Dr. Damita Dunnings, injection of vit L41 given by Brenton Grills. Patient tolerated injection well.

## 2018-12-27 ENCOUNTER — Telehealth: Payer: Self-pay | Admitting: Cardiovascular Disease

## 2018-12-27 NOTE — Telephone Encounter (Signed)
Spoke with patient. States she increased her chlorthalidone to 12.5 mg (1/2 tablet) by mouth every day now. Says she takes a potassium with it though this is not on her list and she does not know the dosage because she is not at home.  She is due to see Dr Fletcher Anon on February 02, 2019.  Advised I will make sure ok with Dr Fletcher Anon to send in as "daily" until upcoming appointment.  Routing to Dr Fletcher Anon.

## 2018-12-27 NOTE — Telephone Encounter (Signed)
Please advise correct instructions for Chlorthalidone 25MG.  Per snapshot:  chlorthalidone (HYGROTON) 25 MG tablet       Sig - Route: Take 12.5 mg by mouth 3 (three) times a week. Take 1/2 tablet every two days. - Oral    Per medication list at last office visit with Dr. Fletcher Anon   chlorthalidone (HYGROTON) 25 MG tablet Take 1/2 tablet every two days.

## 2018-12-27 NOTE — Telephone Encounter (Signed)
°*  STAT* If patient is at the pharmacy, call can be transferred to refill team.   1. Which medications need to be refilled? (please list name of each medication and dose if known)  Chlorthalidone 12.5 mg po q d  2. Which pharmacy/location (including street and city if local pharmacy) is medication to be sent to? [facility] [address] [place]   3. Do they need a 30 day or 90 day supply? Day

## 2018-12-28 MED ORDER — CHLORTHALIDONE 25 MG PO TABS: 12.5000 mg | ORAL_TABLET | Freq: Every day | ORAL | 2 refills | Status: DC

## 2018-12-28 NOTE — Telephone Encounter (Signed)
I am okay with her taking chlorthalidone 12.5 mg once daily instead of every other day.

## 2018-12-28 NOTE — Telephone Encounter (Signed)
Refill send in as daily. Patient notified and was appreciative.

## 2019-01-10 DIAGNOSIS — M5136 Other intervertebral disc degeneration, lumbar region: Secondary | ICD-10-CM | POA: Diagnosis not present

## 2019-01-10 DIAGNOSIS — M25551 Pain in right hip: Secondary | ICD-10-CM | POA: Diagnosis not present

## 2019-01-13 DIAGNOSIS — M25551 Pain in right hip: Secondary | ICD-10-CM | POA: Diagnosis not present

## 2019-01-30 ENCOUNTER — Ambulatory Visit (INDEPENDENT_AMBULATORY_CARE_PROVIDER_SITE_OTHER): Payer: Medicare Other | Admitting: Endocrinology

## 2019-01-30 ENCOUNTER — Encounter: Payer: Self-pay | Admitting: Endocrinology

## 2019-01-30 ENCOUNTER — Other Ambulatory Visit: Payer: Self-pay

## 2019-01-30 VITALS — BP 142/64 | HR 98 | Ht 64.25 in | Wt 199.0 lb

## 2019-01-30 DIAGNOSIS — E041 Nontoxic single thyroid nodule: Secondary | ICD-10-CM | POA: Diagnosis not present

## 2019-01-30 NOTE — Progress Notes (Signed)
Subjective:    Patient ID: Tammy Manning, female    DOB: Sep 18, 1935, 84 y.o.   MRN: [id number]  HPI Pt returns for f/u of MNG (dx'ed 2019; [place] then showed 3.1 cm right inferior TR 3 nodule meets criteria for biopsy; US-guided bxs showed (BETHESDA CATEGORY I), and (later) BENIGN FOLLICULAR NODULE (BETHESDA CATEGORY II); TSH is normal off any rx).  She does not notice the nodule.  pt states she feels well in general, except for arthralgias.  Past Medical History:  Diagnosis Date  . Anginal pain ([place])   . Arthritis    Knees  . B12 deficiency    Mild  . Bucket-handle tear of lateral meniscus of left knee as current injury 12/30/2012  . Cancer (HCC)    Skin, basal cell on nose and eyelid  . DJD (degenerative joint disease)    Low back  . Dyspepsia   . GERD (gastroesophageal reflux disease)   . Heart murmur   . Heart palpitations   . HOH (hard of hearing)   . Hyperlipidemia   . Hypertension   . Lactose intolerance   . Mixed incontinence   . PONV (postoperative nausea and vomiting)   . Vulvar irritation    from urine pad, uses Triamcinolone oint.    Past Surgical History:  Procedure Laterality Date  . ABDOMINAL AORTOGRAM N/A 03/15/2017   Procedure: Abdominal Aortogram;  Surgeon: Wellington Hampshire, MD;  Location: [place] [facility];  Service: Cardiovascular;  Laterality: N/A;  . ABDOMINAL HYSTERECTOMY  1982   Large fibroids and bleeding  . APPENDECTOMY    . BILATERAL SALPINGOOPHORECTOMY  1986  . BONE GRAFT HIP ILIAC CREST     To Left arm  . BREAST REDUCTION SURGERY    . BREAST SURGERY    . CATARACT EXTRACTION W/PHACO Left 07/07/2016   Procedure: CATARACT EXTRACTION PHACO AND INTRAOCULAR LENS PLACEMENT (IOC);  Surgeon: Birder Robson, MD;  Location: [facility];  Service: Ophthalmology;  Laterality: Left;  [place] 01:10 AP% 18.3 CDE 12.91 Fluid pak lot # [id number]  . CATARACT EXTRACTION W/PHACO Right 07/28/2016   Procedure: CATARACT EXTRACTION PHACO AND INTRAOCULAR LENS PLACEMENT  ([place]);  Surgeon: Birder Robson, MD;  Location: [facility];  Service: Ophthalmology;  Laterality: Right;  [place] 01:26 AP% 18.5 CDE 16.12 Fluid pack lot # [id number]  . COLONOSCOPY  07/2016   Dr. Thana Farr  . DILATION AND CURETTAGE OF UTERUS     miscarragesx2  . ESOPHAGOGASTRODUODENOSCOPY     Polyps  . [place]  2006  . KNEE ARTHROSCOPY WITH LATERAL MENISECTOMY Left 12/30/2012   Procedure: KNEE ARTHROSCOPY WITH LATERAL MENISECTOMY;  Surgeon: Johnny Bridge, MD;  Location: [place];  Service: Orthopedics;  Laterality: Left;  LEFT KNEE SCOPE LATERAL MENISCECTOMY  . LEFT HEART CATH AND CORONARY ANGIOGRAPHY N/A 03/15/2017   Procedure: Left Heart Cath and Coronary Angiography;  Surgeon: Wellington Hampshire, MD;  Location: [place] [facility];  Service: Cardiovascular;  Laterality: N/A;  . MOUTH SURGERY    . skin cancer eyelid  2012  . TONSILLECTOMY      Social History   Socioeconomic History  . Marital status: Married    Spouse name: Not on file  . Number of children: 2  . Years of education: Not on file  . Highest education level: Not on file  Occupational History  . Occupation: Architect: RETIRED  Social Needs  . Financial resource strain: Not on file  . Food insecurity:  Worry: Not on file    Inability: Not on file  . Transportation needs:    Medical: Not on file    Non-medical: Not on file  Tobacco Use  . Smoking status: Former Smoker    Packs/day: 1.00    Years: 12.00    Pack years: 12.00    Types: Cigarettes    Last attempt to quit: 11/24/1963    Years since quitting: 55.2  . Smokeless tobacco: Never Used  Substance and Sexual Activity  . Alcohol use: No    Alcohol/week: 0.0 standard drinks    Comment: Rare  . Drug use: No  . Sexual activity: Yes  Lifestyle  . Physical activity:    Days per week: Not on file    Minutes per session: Not on file  . Stress: Not on file  Relationships  . Social connections:    Talks on phone:  Not on file    Gets together: Not on file    Attends religious service: Not on file    Active member of club or organization: Not on file    Attends meetings of clubs or organizations: Not on file    Relationship status: Not on file  . Intimate partner violence:    Fear of current or ex partner: Not on file    Emotionally abused: Not on file    Physically abused: Not on file    Forced sexual activity: Not on file  Other Topics Concern  . Not on file  Social History Narrative   Metallurgist.      Current Outpatient Medications on File Prior to Visit  Medication Sig Dispense Refill  . acetaminophen (TYLENOL) 500 MG tablet Take 500 mg by mouth every 6 (six) hours as needed.    [name] Kitchen aspirin EC 81 MG tablet Take 1 tablet (81 mg total) by mouth daily. 30 tablet 2  . atorvastatin (LIPITOR) 10 MG tablet TAKE 1 TABLET(10 MG) BY MOUTH DAILY AT 6 PM 90 tablet 3  . Calcium Carbonate-Vitamin D (CALCIUM 600+D PO) Take 1 tablet by mouth daily.     . chlorthalidone (HYGROTON) 25 MG tablet Take 0.5 tablets (12.5 mg total) by mouth daily. 90 tablet 2  . clobetasol ointment (TEMOVATE) 8.76 % Apply 1 application topically 2 (two) times daily. To affected area 30 g 1  . clorazepate (TRANXENE) 7.5 MG tablet Take 1 tablet (7.5 mg total) by mouth daily as needed. 30 tablet 0  . cyanocobalamin (,VITAMIN B-12,) 1000 MCG/ML injection Inject 1,000 mcg into the muscle every 30 (thirty) days.     . irbesartan (AVAPRO) 150 MG tablet Take 1 tablet (150 mg total) by mouth daily. 90 tablet 3  . omeprazole (PRILOSEC) 20 MG capsule TAKE 1 CAPSULE(20 MG) BY MOUTH DAILY AS NEEDED FOR HEARTBURN 90 capsule 3  . predniSONE (DELTASONE) 10 MG tablet Take 3 pills once daily by mouth for 3 days, then 2 pills once daily for 3 days, then 1 pill once daily for 3 days and then stop 18 tablet 0   No current facility-administered medications on file prior to visit.     Allergies  Allergen Reactions  . Amlodipine Besylate Other (See  Comments)    Reaction:  Increases BP  . Antihistamines, Chlorpheniramine-Type Other (See Comments)    Reaction:  Makes pt hyper   . Atenolol Hypertension  . Cefuroxime Axetil Other (See Comments)    Upset stomach  . Chlorthalidone Other (See Comments)  . Ciprofloxacin Other (See Comments)  Upset stomach  . Co Q 10 [Coenzyme Q10] Other (See Comments)    Reaction:  GI upset   . Crestor [Rosuvastatin Calcium] Other (See Comments)    Reaction:  Myalgias   . Dextromethorphan-Guaifenesin Other (See Comments)    Reaction:  Made pt jittery   . Epinephrine Hives  . Other     Reaction:  Makes pt hyper   . Ramipril Other (See Comments)    Upset stomach  . Sulfamethoxazole-Trimethoprim Other (See Comments)    Upset stomach  . Vitamin D Analogs Other (See Comments)    Reaction:  GI upset     Family History  Problem Relation Age of Onset  . Cancer Mother        Colon  . Heart disease Father        CAD  . Diabetes Sister   . Hypothyroidism Sister   . Diabetes Brother   . Leukemia Brother   . Cancer Paternal Aunt        Breast  . Diabetes Sister     BP (!) 142/64 (BP Location: Left Arm, Patient Position: Sitting, Cuff Size: Large)   Pulse 98   Ht 5' 4.25" (1.632 m)   Wt 199 lb (90.3 kg)   SpO2 93%   BMI 33.89 kg/m   Review of Systems Denies sob    Objective:   Physical Exam VITAL SIGNS:  See vs page GENERAL: no distress NECK: the 3 cm right thyroid nodule is again palpable, and is freely mobile.     Lab Results  Component Value Date   TSH 1.06 04/21/2018      Assessment & Plan:  MNG: due for recheck.  HTN: pt I advised to recheck with PCP.   Patient Instructions  Let's recheck the ultrasound.  you will receive a phone call, about a day and time for an appointment. If there is no change, please come back for a follow-up appointment in 1 year.

## 2019-01-30 NOTE — Patient Instructions (Signed)
Let's recheck the ultrasound.  you will receive a phone call, about a day and time for an appointment. If there is no change, please come back for a follow-up appointment in 1 year.

## 2019-02-01 ENCOUNTER — Other Ambulatory Visit: Payer: Self-pay | Admitting: Family Medicine

## 2019-02-01 ENCOUNTER — Telehealth: Payer: Self-pay | Admitting: Endocrinology

## 2019-02-01 MED ORDER — CLORAZEPATE DIPOTASSIUM 7.5 MG PO TABS: 7.5000 mg | ORAL_TABLET | Freq: Every day | ORAL | 0 refills | Status: DC | PRN

## 2019-02-01 NOTE — Telephone Encounter (Signed)
Name of Medication: clorazepate        Name of Pharmacy: [facility] [place] or Written Date and Quantity: 09/19/18 #30 tabs with 0 refills Last Office Visit and Type: 11/09/18 Acute appt Next Office Visit and Type: acute body aches 02/03/19 Last Controlled Substance Agreement Date: 06/2013 Last UDS:06/2013

## 2019-02-01 NOTE — Progress Notes (Signed)
Cardiology Office Note   Date:  02/02/2019   ID:  Tammy Manning, DOB 1935-02-10, MRN [id number]  PCP:  Abner Greenspan, MD  Cardiologist:  Kathlyn Sacramento, MD   Chief Complaint  Patient presents with  . Other    12 month follow up. Meds reviewed by the pt. verbally. Pt. c/o muscle aches; pt. thinks it is the chlorthalidone and has noticed BP elevated in the evenings with having a terrible headache.       History of Present Illness: Tammy Manning is a 84 y.o. female who presents for a follow-up visit regarding essential hypertension and mild aortic insufficiency.  She has intolerance to multiple antihypertensive medications.  Cardiac catheterization in April 2018 showed normal coronary arteries and normal LV systolic function.  Abdominal aortogram showed no evidence of renal artery stenosis.    Her most recent echocardiogram in June 2019 showed an EF of 55 - 60% and increased wall thickness in the left ventricle in a pattern of mild LVH. There was mild regurgitation in the aortic valve. The mitral valve showed calcified annulus. The left atrium and the right ventricle's cavity size were mildly dilated.  The patient is doing well today. She is accompanied by her husband today. She has been having a lot of muscle aches all over her body. She has not been tolerating chlorthalidone very well and would like a lower dose. She has been cramping at night recently which may be due to low potassium. She denies chest pain, SOB, palpitations or any other related symptoms or complaints at this time.   Past Medical History:  Diagnosis Date  . Anginal pain ([facility])   . Arthritis    Knees  . B12 deficiency    Mild  . Bucket-handle tear of lateral meniscus of left knee as current injury 12/30/2012  . Cancer ([facility])    Skin, basal cell on nose and eyelid  . DJD (degenerative joint disease)    Low back  . Dyspepsia   . GERD (gastroesophageal reflux disease)   . Heart murmur   . Heart  palpitations   . HOH (hard of hearing)   . Hyperlipidemia   . Hypertension   . Lactose intolerance   . Mixed incontinence   . PONV (postoperative nausea and vomiting)   . Vulvar irritation    from urine pad, uses Triamcinolone oint.    Past Surgical History:  Procedure Laterality Date  . ABDOMINAL AORTOGRAM N/A 03/15/2017   Procedure: Abdominal Aortogram;  Surgeon: Wellington Hampshire, MD;  Location: [facility];  Service: Cardiovascular;  Laterality: N/A;  . ABDOMINAL HYSTERECTOMY  1982   Large fibroids and bleeding  . APPENDECTOMY    . BILATERAL SALPINGOOPHORECTOMY  1986  . BONE GRAFT HIP ILIAC CREST     To Left arm  . BREAST REDUCTION SURGERY    . BREAST SURGERY    . CATARACT EXTRACTION W/PHACO Left 07/07/2016   Procedure: CATARACT EXTRACTION PHACO AND INTRAOCULAR LENS PLACEMENT (IOC);  Surgeon: Birder Robson, MD;  Location: [facility];  Service: Ophthalmology;  Laterality: Left;  [place] 01:10 AP% 18.3 CDE 12.91 Fluid pak lot # [id number]  . CATARACT EXTRACTION W/PHACO Right 07/28/2016   Procedure: CATARACT EXTRACTION PHACO AND INTRAOCULAR LENS PLACEMENT ([place]);  Surgeon: Birder Robson, MD;  Location: [facility];  Service: Ophthalmology;  Laterality: Right;  [place] 01:26 AP% 18.5 CDE 16.12 Fluid pack lot # [id number]  . COLONOSCOPY  07/2016   Dr. Thana Farr  . DILATION AND CURETTAGE  OF UTERUS     miscarragesx2  . ESOPHAGOGASTRODUODENOSCOPY     Polyps  . [place]  2006  . KNEE ARTHROSCOPY WITH LATERAL MENISECTOMY Left 12/30/2012   Procedure: KNEE ARTHROSCOPY WITH LATERAL MENISECTOMY;  Surgeon: Johnny Bridge, MD;  Location: [facility];  Service: Orthopedics;  Laterality: Left;  LEFT KNEE SCOPE LATERAL MENISCECTOMY  . LEFT HEART CATH AND CORONARY ANGIOGRAPHY N/A 03/15/2017   Procedure: Left Heart Cath and Coronary Angiography;  Surgeon: Wellington Hampshire, MD;  Location: [facility];  Service: Cardiovascular;  Laterality: N/A;  . MOUTH SURGERY    . skin  cancer eyelid  2012  . TONSILLECTOMY       Current Outpatient Medications  Medication Sig Dispense Refill  . acetaminophen (TYLENOL) 500 MG tablet Take 500 mg by mouth every 6 (six) hours as needed.    [name] Kitchen aspirin EC 81 MG tablet Take 1 tablet (81 mg total) by mouth daily. 30 tablet 2  . atorvastatin (LIPITOR) 10 MG tablet TAKE 1 TABLET(10 MG) BY MOUTH DAILY AT 6 PM 90 tablet 3  . Calcium Carbonate-Vitamin D (CALCIUM 600+D PO) Take 1 tablet by mouth daily.     . chlorthalidone (HYGROTON) 25 MG tablet Take 0.5 tablets (12.5 mg total) by mouth daily. (Patient taking differently: Take 12.5 mg by mouth every other day. ) 90 tablet 2  . clobetasol ointment (TEMOVATE) 7.03 % Apply 1 application topically 2 (two) times daily. To affected area 30 g 1  . clorazepate (TRANXENE) 7.5 MG tablet Take 1 tablet (7.5 mg total) by mouth daily as needed. 30 tablet 0  . cyanocobalamin (,VITAMIN B-12,) 1000 MCG/ML injection Inject 1,000 mcg into the muscle every 30 (thirty) days.     . irbesartan (AVAPRO) 150 MG tablet Take 1 tablet (150 mg total) by mouth daily. 90 tablet 3  . omeprazole (PRILOSEC) 20 MG capsule TAKE 1 CAPSULE(20 MG) BY MOUTH DAILY AS NEEDED FOR HEARTBURN 90 capsule 3   No current facility-administered medications for this visit.     Allergies:   Amlodipine besylate; Antihistamines, chlorpheniramine-type; Atenolol; Cefuroxime axetil; Chlorthalidone; Ciprofloxacin; Co q 10 [coenzyme q10]; Crestor [rosuvastatin calcium]; Dextromethorphan-guaifenesin; Epinephrine; Other; Ramipril; Sulfamethoxazole-trimethoprim; and Vitamin d analogs    Social History:  The patient  reports that she quit smoking about 55 years ago. Her smoking use included cigarettes. She has a 12.00 pack-year smoking history. She has never used smokeless tobacco. She reports that she does not drink alcohol or use drugs.   Family History:  The patient's family history includes Cancer in her mother and paternal aunt; Diabetes in her  brother, sister, and sister; Heart disease in her father; Hypothyroidism in her sister; Leukemia in her brother.    ROS:  Please see the history of present illness.   Otherwise, review of systems are positive for none.   All other systems are reviewed and negative.    PHYSICAL EXAM: VS:  BP 124/62 (BP Location: Left Arm, Patient Position: Sitting, Cuff Size: Normal)   Pulse 73   Ht 5' 4"  (1.626 m)   Wt 196 lb 12 oz (89.2 kg)   BMI 33.77 kg/m  , BMI Body mass index is 33.77 kg/m. GEN: Well nourished, well developed, in no acute distress  HEENT: normal  Neck: no JVD, carotid bruits, or masses Cardiac: RRR; no  rubs, or gallops,no edema, no murmur appreciated.  Respiratory:  clear to auscultation bilaterally, normal work of breathing GI: soft, nontender, nondistended, + BS MS:  no deformity or atrophy  Skin: warm and dry, no rash Neuro:  Strength and sensation are intact Psych: euthymic mood, full affect   EKG:  EKG is ordered today. The ekg ordered today demonstrates normal sinus rhythm with no significant ST or T wave changes.   Recent Labs: 04/21/2018: TSH 1.06 05/09/2018: ALT 13; BUN 20; Creatinine, Ser 0.74; Hemoglobin 13.5; Platelets 301; Potassium 3.9; Sodium 138    Lipid Panel    Component Value Date/Time   CHOL 174 05/10/2018 0511   TRIG 118 05/10/2018 0511   HDL 57 05/10/2018 0511   CHOLHDL 3.1 05/10/2018 0511   VLDL 24 05/10/2018 0511   LDLCALC 93 05/10/2018 0511   LDLDIRECT 105.9 02/08/2013 1050      Wt Readings from Last 3 Encounters:  02/02/19 196 lb 12 oz (89.2 kg)  01/30/19 199 lb (90.3 kg)  11/09/18 194 lb (88 kg)       No flowsheet data found.    ASSESSMENT AND PLAN:   1. Aortic insufficiency: This was mild on most recent echocardiogram in 2019.  Continue to monitor clinically.  2. Essential hypertension:   She complains of increased leg cramps and she thinks it is due to chlorthalidone.  I elected to stop this and increase irbesartan to  300 mg once daily.  3. Hyperlipidemia: Currently she is on atorvastatin 10 mg daily.    Disposition:   FU with me in 12 months  I, Jesus Reyes am acting as a Education administrator for Kathlyn Sacramento, M.D.  I have reviewed the above documentation for accuracy and completeness, and I agree with the above.   Signed, Kathlyn Sacramento, MD 02/02/19 [place], [place]

## 2019-02-01 NOTE — Telephone Encounter (Signed)
please contact patient: BP was high when you were here.  Please have rechecked at PCP

## 2019-02-02 ENCOUNTER — Other Ambulatory Visit: Payer: Self-pay

## 2019-02-02 ENCOUNTER — Ambulatory Visit (INDEPENDENT_AMBULATORY_CARE_PROVIDER_SITE_OTHER): Payer: Medicare Other | Admitting: Cardiovascular Disease

## 2019-02-02 ENCOUNTER — Telehealth: Payer: Self-pay | Admitting: Endocrinology

## 2019-02-02 ENCOUNTER — Encounter: Payer: Self-pay | Admitting: Cardiovascular Disease

## 2019-02-02 VITALS — BP 124/62 | HR 73 | Ht 64.0 in | Wt 196.8 lb

## 2019-02-02 DIAGNOSIS — I10 Essential (primary) hypertension: Secondary | ICD-10-CM | POA: Diagnosis not present

## 2019-02-02 DIAGNOSIS — I359 Nonrheumatic aortic valve disorder, unspecified: Secondary | ICD-10-CM

## 2019-02-02 DIAGNOSIS — E785 Hyperlipidemia, unspecified: Secondary | ICD-10-CM

## 2019-02-02 MED ORDER — IRBESARTAN 300 MG PO TABS: 300.0000 mg | ORAL_TABLET | Freq: Every day | ORAL | 3 refills | Status: DC

## 2019-02-02 NOTE — Telephone Encounter (Signed)
Patient stated that she seen her Heart Doctor for her blood pressure and they changed her blood pressure medication.

## 2019-02-02 NOTE — Telephone Encounter (Signed)
Called pt and left detailed VM making her aware.

## 2019-02-02 NOTE — Telephone Encounter (Signed)
FYI

## 2019-02-02 NOTE — Patient Instructions (Signed)
Medication Instructions:  STOP the Chlorthalidone INCREASE the Avapro (Irbesartan) to 300 mg once daily  If you need a refill on your cardiac medications before your next appointment, please call your pharmacy.   Lab work: None ordered  Testing/Procedures: None ordered  Follow-Up: At [facility], you and your health needs are our priority.  As part of our continuing mission to provide you with exceptional heart care, we have created designated Provider Care Teams.  These Care Teams include your primary Cardiologist (physician) and Advanced Practice Providers (APPs -  Physician Assistants and Nurse Practitioners) who all work together to provide you with the care you need, when you need it. You will need a follow up appointment in 12 months.  Please call our office 2 months in advance to schedule this appointment.  You may see Kathlyn Sacramento, MD or one of the following Advanced Practice Providers on your designated Care Team:   Murray Hodgkins, NP [name], PA-C . Marrianne Mood, PA-C

## 2019-02-03 ENCOUNTER — Ambulatory Visit: Payer: Medicare Other | Admitting: Family Medicine

## 2019-02-22 ENCOUNTER — Encounter: Payer: Self-pay | Admitting: Family Medicine

## 2019-02-22 MED ORDER — TRIAMCINOLONE ACETONIDE 55 MCG/ACT NA AERO: 2.0000 | INHALATION_SPRAY | Freq: Every day | NASAL | 12 refills | Status: DC

## 2019-04-24 ENCOUNTER — Telehealth: Payer: Self-pay | Admitting: Family Medicine

## 2019-04-24 DIAGNOSIS — E7800 Pure hypercholesterolemia, unspecified: Secondary | ICD-10-CM

## 2019-04-24 DIAGNOSIS — E538 Deficiency of other specified B group vitamins: Secondary | ICD-10-CM

## 2019-04-24 DIAGNOSIS — I10 Essential (primary) hypertension: Secondary | ICD-10-CM

## 2019-04-24 NOTE — Telephone Encounter (Signed)
-----   Message from Ellamae Sia sent at 04/24/2019 11:08 AM EDT ----- Regarding: Lab orders for Wednesday, 6.3.20 Lab orders

## 2019-04-25 ENCOUNTER — Ambulatory Visit (INDEPENDENT_AMBULATORY_CARE_PROVIDER_SITE_OTHER): Payer: Medicare Other

## 2019-04-25 DIAGNOSIS — Z0000 Encounter for general adult medical examination without abnormal findings: Secondary | ICD-10-CM

## 2019-04-25 NOTE — Progress Notes (Signed)
Subjective:   Tammy Manning is a 84 y.o. female who presents for Medicare Annual (Subsequent) preventive examination.  Review of Systems:  N/A Cardiac Risk Factors include: advanced age (>74mn, >>9women);dyslipidemia;hypertension;obesity (BMI >30kg/m2)     Objective:     Vitals: There were no vitals taken for this visit.  There is no height or weight on file to calculate BMI.  Advanced Directives 04/25/2019 05/09/2018 04/21/2018 04/16/2017 10/21/2016 09/15/2016 09/03/2016  Does Patient Have a Medical Advance Directive? Yes Yes Yes Yes Yes No No  Type of Advance Directive Living will Living will Living will Living will [place] will - -  Does patient want to make changes to medical advance directive? - No - Patient declined - - - - -  Copy of [place] IIin Chart? - - - - - - -  Would patient like information on creating a medical advance directive? No - Patient declined - - - - No - patient declined information Yes - EScientist, clinical (histocompatibility and immunogenetics)given    Tobacco Social History   Tobacco Use  Smoking Status Former Smoker   Packs/day: 1.00   Years: 12.00   Pack years: 12.00   Types: Cigarettes   Last attempt to quit: 11/24/1963   Years since quitting: 55.4  Smokeless Tobacco Never Used     Counseling given: No   Clinical Intake:  Pre-visit preparation completed: Yes  Pain : No/denies pain Pain Score: 0-No pain     Nutritional Status: BMI 25 -29 Overweight Nutritional Risks: None Diabetes: No  How often do you need to have someone help you when you read instructions, pamphlets, or other written materials from your doctor or pharmacy?: 1 - Never What is the last grade level you completed in school?: Associate Degree  Interpreter Needed?: No  Comments: pt lives with spouse Information entered by :: LPinson, LPN  Past Medical History:  Diagnosis Date   Anginal pain ([facility]    Arthritis    Knees   B12 deficiency    Mild   Bucket-handle tear of lateral meniscus of left knee as current injury 12/30/2012   Cancer ([place]    Skin, basal cell on nose and eyelid   DJD (degenerative joint disease)    Low back   Dyspepsia    GERD (gastroesophageal reflux disease)    Heart murmur    Heart palpitations    HOH (hard of hearing)    Hyperlipidemia    Hypertension    Lactose intolerance    Mixed incontinence    PONV (postoperative nausea and vomiting)    Vulvar irritation    from urine pad, uses Triamcinolone oint.   Past Surgical History:  Procedure Laterality Date   ABDOMINAL AORTOGRAM N/A 03/15/2017   Procedure: Abdominal Aortogram;  Surgeon: MWellington Hampshire MD;  [facility];  Service: Cardiovascular;  Laterality: N/A;   ABDOMINAL HYSTERECTOMY  1982   Large fibroids and bleeding   APPENDECTOMY     BILATERAL SALPINGOOPHORECTOMY  1986   BONE GRAFT HIP ILIAC CREST     To Left arm   BREAST REDUCTION SURGERY     BREAST SURGERY     CATARACT EXTRACTION W/PHACO Left 07/07/2016   Procedure: CATARACT EXTRACTION PHACO AND INTRAOCULAR LENS PLACEMENT (IOC);  Surgeon: WBirder Robson MD;  Location: [facility];  Service: Ophthalmology;  Laterality: Left;  [place]10 AP% 18.3 CDE 12.91 Fluid pak lot # [id number]   CATARACT EXTRACTION W/PHACO Right 07/28/2016   Procedure:  CATARACT EXTRACTION PHACO AND INTRAOCULAR LENS PLACEMENT (IOC);  Surgeon: Birder Robson, MD;  Location: [facility];  Service: Ophthalmology;  Laterality: Right;  [place] 01:26 AP% 18.5 CDE 16.12 Fluid pack lot # [id number]   COLONOSCOPY  07/2016   Dr. Thana Farr   DILATION AND CURETTAGE OF UTERUS     miscarragesx2   ESOPHAGOGASTRODUODENOSCOPY     Polyps   HEMORRHOID SURGERY  2006   KNEE ARTHROSCOPY WITH LATERAL MENISECTOMY Left 12/30/2012   Procedure: KNEE ARTHROSCOPY WITH LATERAL MENISECTOMY;  Surgeon: Johnny Bridge, MD;  Location: [facility];  Service: Orthopedics;  Laterality: Left;  LEFT KNEE  SCOPE LATERAL MENISCECTOMY   LEFT HEART CATH AND CORONARY ANGIOGRAPHY N/A 03/15/2017   Procedure: Left Heart Cath and Coronary Angiography;  Surgeon: Wellington Hampshire, MD;  Location: [facility];  Service: Cardiovascular;  Laterality: N/A;   MOUTH SURGERY     skin cancer eyelid  2012   TONSILLECTOMY     Family History  Problem Relation Age of Onset   Cancer Mother        Colon   Heart disease Father        CAD   Diabetes Sister    Hypothyroidism Sister    Diabetes Brother    Leukemia Brother    Cancer Paternal Aunt        Breast   Diabetes Sister    Social History   Socioeconomic History   Marital status: Married    Spouse name: Not on file   Number of children: 2   Years of education: Not on file   Highest education level: Not on file  Occupational History   Occupation: Architect: RETIRED  Social Needs   Financial resource strain: Not on file   Food insecurity:    Worry: Not on file    Inability: Not on file   Transportation needs:    Medical: Not on file    Non-medical: Not on file  Tobacco Use   Smoking status: Former Smoker    Packs/day: 1.00    Years: 12.00    Pack years: 12.00    Types: Cigarettes    Last attempt to quit: 11/24/1963    Years since quitting: 55.4   Smokeless tobacco: Never Used  Substance and Sexual Activity   Alcohol use: No    Alcohol/week: 0.0 standard drinks    Comment: Rare   Drug use: No   Sexual activity: Yes  Lifestyle   Physical activity:    Days per week: Not on file    Minutes per session: Not on file   Stress: Not on file  Relationships   Social connections:    Talks on phone: Not on file    Gets together: Not on file    Attends religious service: Not on file    Active member of club or organization: Not on file    Attends meetings of clubs or organizations: Not on file    Relationship status: Not on file  Other Topics Concern   Not on file  Social History  Narrative   Metallurgist.      Outpatient Encounter Medications as of 04/25/2019  Medication Sig   acetaminophen (TYLENOL) 500 MG tablet Take 500 mg by mouth every 6 (six) hours as needed.   aspirin EC 81 MG tablet Take 1 tablet (81 mg total) by mouth daily.   atorvastatin (LIPITOR) 10 MG tablet TAKE 1 TABLET(10 MG) BY MOUTH DAILY  AT 6 PM   Calcium Carbonate-Vitamin D (CALCIUM 600+D PO) Take 1 tablet by mouth daily.    clobetasol ointment (TEMOVATE) 8.33 % Apply 1 application topically 2 (two) times daily. To affected area   clorazepate (TRANXENE) 7.5 MG tablet Take 1 tablet (7.5 mg total) by mouth daily as needed.   cyanocobalamin (,VITAMIN B-12,) 1000 MCG/ML injection Inject 1,000 mcg into the muscle every 30 (thirty) days.    irbesartan (AVAPRO) 300 MG tablet Take 1 tablet (300 mg total) by mouth daily.   omeprazole (PRILOSEC) 20 MG capsule TAKE 1 CAPSULE(20 MG) BY MOUTH DAILY AS NEEDED FOR HEARTBURN   triamcinolone (NASACORT) 55 MCG/ACT AERO nasal inhaler Place 2 sprays into the nose daily.   No facility-administered encounter medications on file as of 04/25/2019.     Activities of Daily Living In your present state of health, do you have any difficulty performing the following activities: 04/25/2019 05/09/2018  Hearing? N Y  Vision? N N  Difficulty concentrating or making decisions? N N  Walking or climbing stairs? N N  Dressing or bathing? N N  Doing errands, shopping? N N  Preparing Food and eating ? N -  Using the Toilet? N -  In the past six months, have you accidently leaked urine? N -  Do you have problems with loss of bowel control? N -  Managing your Medications? N -  Managing your Finances? N -  Housekeeping or managing your Housekeeping? N -  Some recent data might be hidden    Patient Care Team: [name], [name], MD as PCP - General Susa Day, MD (Orthopedic Surgery) Richmond Campbell, MD (Gastroenterology) Wellington Hampshire, MD as Consulting Physician  (Cardiology) Birder Robson, MD as Referring Physician (Ophthalmology)    Assessment:   This is a routine wellness examination for [place].  Vision Screening Comments: Vision exam in 2019 with Dr. George Ina  Exercise Activities and Dietary recommendations Current Exercise Habits: Home exercise routine, Type of exercise: walking, Time (Minutes): 60, Frequency (Times/Week): 7, Weekly Exercise (Minutes/Week): 420, Intensity: Mild, Exercise limited by: None identified  Goals          Patient Stated     Starting 04/25/2019, I will continue to walk 2-3 miles daily.                                Fall Risk Fall Risk  04/25/2019 04/21/2018 04/16/2017 10/21/2016 09/15/2016  Falls in the past year? 0 No No No No  Risk for fall due to : - - - - Other (Comment)   Depression Screen PHQ 2/9 Scores 04/25/2019 04/21/2018 04/16/2017 10/21/2016  PHQ - 2 Score 0 0 0 0  PHQ- 9 Score 0 0 - -  Exception Documentation - - - -     Cognitive Function MMSE - Mini Mental State Exam 04/25/2019 04/21/2018 04/16/2017 04/14/2016  Orientation to time 5 5 5 5   Orientation to Place 5 5 5 5   Registration 3 3 3 3   Attention/ Calculation 0 0 0 0  Recall 2 3 3 3   Recall-comments unable to recall 1 of 3 words - - -  Language- name 2 objects 0 0 0 0  Language- repeat 1 1 1 1   Language- follow 3 step command 0 3 3 3   Language- read & follow direction 0 0 0 0  Write a sentence 0 0 0 0  Copy design 0 0 0 0  Total  score 16 20 20 20      PLEASE NOTE: A Mini-Cog screen was completed. Maximum score is 17. A value of 0 denotes this part of [place] MMSE was not completed or the patient failed this part of the Mini-Cog screening.   Mini-Cog Screening Orientation to Time - Max 5 pts Orientation to Place - Max 5 pts Registration - Max 3 pts Recall - Max 3 pts Language Repeat - Max 1 pts   Immunization History  Administered Date(s) Administered   Influenza Split 07/24/2011, 08/23/2014   Influenza Whole  08/23/2004, 07/24/2009, 08/06/2010, 08/05/2012   Influenza, High Dose Seasonal PF 08/11/2016, 08/27/2017, 08/02/2018   Influenza,inj,Quad PF,6+ Mos 08/16/2013, 08/07/2015   Pneumococcal Conjugate-13 08/24/2014   Pneumococcal Polysaccharide-23 08/20/2008   Td 03/20/2003, 02/28/2013   Zoster 09/04/2015    Screening Tests Health Maintenance  Topic Date Due   INFLUENZA VACCINE  06/24/2019   MAMMOGRAM  08/26/2019   TETANUS/TDAP  03/01/2023   DEXA SCAN  Completed   PNA vac Low Risk Adult  Completed       Plan:     I have personally reviewed, addressed, and noted the following in the patients chart:  A. Medical and social history B. Use of alcohol, tobacco or illicit drugs  C. Current medications and supplements D. Functional ability and status E.  Nutritional status F.  Physical activity G. Advance directives H. List of other physicians I.  Hospitalizations, surgeries, and ER visits in previous 12 months J.  Vitals (unless it is a telemedicine encounter) K. Screenings to include cognitive, depression, hearing, vision (NOTE: hearing and vision screenings not completed in telemedicine encounter) L. Referrals and appointments   In addition, I have reviewed and discussed with patient certain preventive protocols, quality metrics, and best practice recommendations. A written personalized care plan for preventive services and recommendations were provided to patient.  With patient's permission, we connected on 04/25/19 at 10:30 AM EDT by a video enabled telemedicine application. Two patient identifiers were used to ensure the encounter occurred with the correct person.    Patient was in home and writer was in office.   Signed,   Lindell Noe, MHA, BS, LPN Health Coach

## 2019-04-25 NOTE — Patient Instructions (Signed)
Ms. Brunker , Thank you for taking time to come for your Medicare Wellness Visit. I appreciate your ongoing commitment to your health goals. Please review the following plan we discussed and let me know if I can assist you in the future.   These are the goals we discussed: Goals    . Patient Stated     Starting 04/25/2019, I will continue to walk 2-3 miles daily.        This is a list of the screening recommended for you and due dates:  Health Maintenance  Topic Date Due  . Flu Shot  06/24/2019  . Mammogram  08/26/2019  . Tetanus Vaccine  03/01/2023  . DEXA scan (bone density measurement)  Completed  . Pneumonia vaccines  Completed   Preventive Care for Adults  A healthy lifestyle and preventive care can promote health and wellness. Preventive health guidelines for adults include the following key practices.  . A routine yearly physical is a good way to check with your health care provider about your health and preventive screening. It is a chance to share any concerns and updates on your health and to receive a thorough exam.  . Visit your dentist for a routine exam and preventive care every 6 months. Brush your teeth twice a day and floss once a day. Good oral hygiene prevents tooth decay and gum disease.  . The frequency of eye exams is based on your age, health, family medical history, use  of contact lenses, and other factors. Follow your health care provider's recommendations for frequency of eye exams.  . Eat a healthy diet. Foods like vegetables, fruits, whole grains, low-fat dairy products, and lean protein foods contain the nutrients you need without too many calories. Decrease your intake of foods high in solid fats, added sugars, and salt. Eat the right amount of calories for you. Get information about a proper diet from your health care provider, if necessary.  . Regular physical exercise is one of the most important things you can do for your health. Most adults should  get at least 150 minutes of moderate-intensity exercise (any activity that increases your heart rate and causes you to sweat) each week. In addition, most adults need muscle-strengthening exercises on 2 or more days a week.  Silver Sneakers may be a benefit available to you. To determine eligibility, you may visit the website: www.silversneakers.com or contact program at 2070384823 Mon-Fri between 8AM-8PM.   . Maintain a healthy weight. The body mass index (BMI) is a screening tool to identify possible weight problems. It provides an estimate of body fat based on height and weight. Your health care provider can find your BMI and can help you achieve or maintain a healthy weight.   For adults 20 years and older: ? A BMI below 18.5 is considered underweight. ? A BMI of 18.5 to 24.9 is normal. ? A BMI of 25 to 29.9 is considered overweight. ? A BMI of 30 and above is considered obese.   . Maintain normal blood lipids and cholesterol levels by exercising and minimizing your intake of saturated fat. Eat a balanced diet with plenty of fruit and vegetables. Blood tests for lipids and cholesterol should begin at age 56 and be repeated every 5 years. If your lipid or cholesterol levels are high, you are over 50, or you are at high risk for heart disease, you may need your cholesterol levels checked more frequently. Ongoing high lipid and cholesterol levels should be treated with  medicines if diet and exercise are not working.  . If you smoke, find out from your health care provider how to quit. If you do not use tobacco, please do not start.  . If you choose to drink alcohol, please do not consume more than 2 drinks per day. One drink is considered to be 12 ounces (355 mL) of beer, 5 ounces (148 mL) of wine, or 1.5 ounces (44 mL) of liquor.  . If you are 62-32 years old, ask your health care provider if you should take aspirin to prevent strokes.  . Use sunscreen. Apply sunscreen liberally and  repeatedly throughout the day. You should seek shade when your shadow is shorter than you. Protect yourself by wearing long sleeves, pants, a wide-brimmed hat, and sunglasses year round, whenever you are outdoors.  . Once a month, do a whole body skin exam, using a mirror to look at the skin on your back. Tell your health care provider of new moles, moles that have irregular borders, moles that are larger than a pencil eraser, or moles that have changed in shape or color.

## 2019-04-25 NOTE — Progress Notes (Signed)
PCP notes:   Health maintenance:  No gaps identified  Abnormal screenings:   None  Patient concerns:   None  Nurse concerns:  None  Next PCP appt:   04/28/19 @ 0930  I reviewed health advisor's note, was available for consultation, and agree with documentation and plan. Loura Pardon MD

## 2019-04-26 ENCOUNTER — Other Ambulatory Visit: Payer: Medicare Other

## 2019-04-26 ENCOUNTER — Other Ambulatory Visit: Payer: Self-pay

## 2019-04-27 ENCOUNTER — Other Ambulatory Visit (INDEPENDENT_AMBULATORY_CARE_PROVIDER_SITE_OTHER): Payer: Medicare Other

## 2019-04-27 DIAGNOSIS — E538 Deficiency of other specified B group vitamins: Secondary | ICD-10-CM | POA: Diagnosis not present

## 2019-04-27 DIAGNOSIS — I10 Essential (primary) hypertension: Secondary | ICD-10-CM | POA: Diagnosis not present

## 2019-04-27 DIAGNOSIS — E7800 Pure hypercholesterolemia, unspecified: Secondary | ICD-10-CM | POA: Diagnosis not present

## 2019-04-27 LAB — CBC WITH DIFFERENTIAL/PLATELET
Basophils Absolute: 0 10*3/uL (ref 0.0–0.1)
Basophils Relative: 0.8 % (ref 0.0–3.0)
Eosinophils Absolute: 0.2 10*3/uL (ref 0.0–0.7)
Eosinophils Relative: 3.2 % (ref 0.0–5.0)
HCT: 37.8 % (ref 36.0–46.0)
Hemoglobin: 12.7 g/dL (ref 12.0–15.0)
Lymphocytes Relative: 35.7 % (ref 12.0–46.0)
Lymphs Abs: 2.2 10*3/uL (ref 0.7–4.0)
MCHC: 33.6 g/dL (ref 30.0–36.0)
MCV: 91.5 fl (ref 78.0–100.0)
Monocytes Absolute: 0.5 10*3/uL (ref 0.1–1.0)
Monocytes Relative: 9 % (ref 3.0–12.0)
Neutro Abs: 3.1 10*3/uL (ref 1.4–7.7)
Neutrophils Relative %: 51.3 % (ref 43.0–77.0)
Platelets: 286 10*3/uL (ref 150.0–400.0)
RBC: 4.13 Mil/uL (ref 3.87–5.11)
RDW: 14.6 % (ref 11.5–15.5)
WBC: 6.1 10*3/uL (ref 4.0–10.5)

## 2019-04-27 LAB — COMPREHENSIVE METABOLIC PANEL
ALT: 10 U/L (ref 0–35)
AST: 14 U/L (ref 0–37)
Albumin: 3.9 g/dL (ref 3.5–5.2)
Alkaline Phosphatase: 55 U/L (ref 39–117)
BUN: 16 mg/dL (ref 6–23)
CO2: 27 mEq/L (ref 19–32)
Calcium: 9.7 mg/dL (ref 8.4–10.5)
Chloride: 105 mEq/L (ref 96–112)
Creatinine, Ser: 0.82 mg/dL (ref 0.40–1.20)
GFR: 66.73 mL/min (ref >60.00)
Glucose, Bld: 89 mg/dL (ref 70–99)
Potassium: 4.4 mEq/L (ref 3.5–5.1)
Sodium: 141 mEq/L (ref 135–145)
Total Bilirubin: 0.4 mg/dL (ref 0.2–1.2)
Total Protein: 6.5 g/dL (ref 6.0–8.3)

## 2019-04-27 LAB — LIPID PANEL
Cholesterol: 195 mg/dL (ref 0–200)
HDL: 64.7 mg/dL (ref >39.00)
LDL Cholesterol: 107 mg/dL — ABNORMAL HIGH (ref 0–99)
NonHDL: 130.58
Total CHOL/HDL Ratio: 3
Triglycerides: 118 mg/dL (ref 0.0–149.0)
VLDL: 23.6 mg/dL (ref 0.0–40.0)

## 2019-04-27 LAB — TSH: TSH: 1.77 u[IU]/mL (ref 0.35–4.50)

## 2019-04-27 LAB — VITAMIN B12: Vitamin B-12: 244 pg/mL (ref 211–911)

## 2019-04-28 ENCOUNTER — Ambulatory Visit (INDEPENDENT_AMBULATORY_CARE_PROVIDER_SITE_OTHER): Payer: Medicare Other | Admitting: Family Medicine

## 2019-04-28 ENCOUNTER — Encounter: Payer: Self-pay | Admitting: Family Medicine

## 2019-04-28 VITALS — Wt 194.0 lb

## 2019-04-28 DIAGNOSIS — E538 Deficiency of other specified B group vitamins: Secondary | ICD-10-CM | POA: Diagnosis not present

## 2019-04-28 DIAGNOSIS — E7800 Pure hypercholesterolemia, unspecified: Secondary | ICD-10-CM

## 2019-04-28 DIAGNOSIS — I480 Paroxysmal atrial fibrillation: Secondary | ICD-10-CM | POA: Diagnosis not present

## 2019-04-28 DIAGNOSIS — I10 Essential (primary) hypertension: Secondary | ICD-10-CM

## 2019-04-28 DIAGNOSIS — E041 Nontoxic single thyroid nodule: Secondary | ICD-10-CM

## 2019-04-28 NOTE — Assessment & Plan Note (Signed)
She is due for [place] and will re schedule from the pandemic shut down

## 2019-04-28 NOTE — Assessment & Plan Note (Signed)
Lab Results  Component Value Date   VITAMINB12 244 04/27/2019   Has missed shots due to the pandemic  Will schedule nurse visit for one

## 2019-04-28 NOTE — Assessment & Plan Note (Signed)
bp in fair control at this time  BP Readings from Last 1 Encounters:  02/02/19 124/62   No changes needed Most recent labs reviewed  Disc lifstyle change with low sodium diet and exercise  She reports taking ?chlothalidone every 3 d- now tolerates it if not taken daily  This seems to control bp better

## 2019-04-28 NOTE — Assessment & Plan Note (Signed)
Disc goals for lipids and reasons to control them Rev last labs with pt Rev low sat fat diet in detail occ misses a dose of atorvastatin- LDL is up a bit

## 2019-04-28 NOTE — Assessment & Plan Note (Signed)
In NSR No problems lately

## 2019-04-28 NOTE — Progress Notes (Signed)
Virtual Visit via Video Note  I connected with Tammy Manning on 04/28/19 at  9:30 AM EDT by a video enabled telemedicine application and verified that I am speaking with the correct person using two identifiers.  Location: Patient: home Provider: office    I discussed the limitations of evaluation and management by telemedicine and the availability of in person appointments. The patient expressed understanding and agreed to proceed.  Failed video today- attempted but pt could not get the video portion to work so completed by phone  History of Present Illness: Pt presents for annual f/u of chronic health problems   amw was 6/2 No gaps or concerns   She is home/trying to get outdoors  Getting some exercise -walking all the time   Feels good overall - feels her age Aches and pains  She keeps going   Weight Wt Readings from Last 3 Encounters:  02/02/19 196 lb 12 oz (89.2 kg)  01/30/19 199 lb (90.3 kg)  11/09/18 194 lb (88 kg)  latest wt 194 lb    Mammogram 10/19 Self breast exam-no lumps   dexa 11/12 Bone density in the normal range No falls or fractures  Taking her ca and D   Colon screening Had colonoscopy 6/19 Dr Allyn Kenner- some colitis but no polyps Has been on medication for that on and off  Doing well now Has to have a colonoscopy every 3-5 years  Mother had colon cancer    HTN BP Readings from Last 3 Encounters:  02/02/19 124/62  01/30/19 (!) 142/64  11/09/18 100/60  her bp fluctuates  If it goes over 865 systolic she gets a headache / tension  Highest systolic was 784  Taking a chlorthalidone every 3 days- (no cramping if she takes it like that)  This is holding it below 696 systolic   Her last resting bp 122/55    Lab Results  Component Value Date   CREATININE 0.82 04/27/2019   BUN 16 04/27/2019   NA 141 04/27/2019   K 4.4 04/27/2019   CL 105 04/27/2019   CO2 27 04/27/2019   Lab Results  Component Value Date   ALT 10 04/27/2019   AST 14  04/27/2019   ALKPHOS 55 04/27/2019   BILITOT 0.4 04/27/2019   Lab Results  Component Value Date   WBC 6.1 04/27/2019   HGB 12.7 04/27/2019   HCT 37.8 04/27/2019   MCV 91.5 04/27/2019   PLT 286.0 04/27/2019   Lab Results  Component Value Date   TSH 1.77 04/27/2019   is due for a thyroid scan    B12 def Lab Results  Component Value Date   VITAMINB12 244 04/27/2019  not getting her B12 shots  Will set that up   Hyperlipidemia Lab Results  Component Value Date   CHOL 195 04/27/2019   CHOL 174 05/10/2018   CHOL 201 (H) 04/21/2018   Lab Results  Component Value Date   HDL 64.70 04/27/2019   HDL 57 05/10/2018   HDL 63.20 04/21/2018   Lab Results  Component Value Date   LDLCALC 107 (H) 04/27/2019   LDLCALC 93 05/10/2018   LDLCALC 104 (H) 04/21/2018   Lab Results  Component Value Date   TRIG 118.0 04/27/2019   TRIG 118 05/10/2018   TRIG 165.0 (H) 04/21/2018   Lab Results  Component Value Date   CHOLHDL 3 04/27/2019   CHOLHDL 3.1 05/10/2018   CHOLHDL 3 04/21/2018   Lab Results  Component Value Date   LDLDIRECT  105.9 02/08/2013   LDLDIRECT 125.0 09/12/2012   LDLDIRECT 119.3 12/31/2011   Taking atovastatin occ misses a dose  Diet is generally excellent  Eating at home -better than eating out    Patient Active Problem List   Diagnosis Date Noted  . Trochanteric bursitis of right hip 11/09/2018  . Transient global amnesia 05/12/2018  . Right thyroid nodule 05/12/2018  . TIA (transient ischemic attack) 05/09/2018  . Angina, class II ([place]) 09/10/2015  . Aortic insufficiency 09/10/2015  . Stress reaction 09/08/2015  . Encounter for Medicare annual wellness exam 02/28/2013  . Bucket-handle tear of lateral meniscus of left knee as current injury 12/30/2012  . Episodic atrial fibrillation ([place]) 11/17/2012  . Special screening for malignant neoplasms, colon 01/21/2012  . Hearing loss of both ears 01/14/2012  . Adverse drug reaction 03/12/2011  . HEADACHE,  CHRONIC 09/24/2010  . B12 deficiency 03/17/2010  . DYSPEPSIA 03/17/2010  . POSTMENOPAUSAL STATUS 08/20/2008  . Essential hypertension 07/19/2007  . Hyperlipidemia 06/24/2007  . CONSTIPATION 06/24/2007  . IBS 06/24/2007  . DERMATITIS, ATOPIC 06/24/2007  . SKIN CANCER, HX OF 06/24/2007   Past Medical History:  Diagnosis Date  . Anginal pain ([place])   . Arthritis    Knees  . B12 deficiency    Mild  . Bucket-handle tear of lateral meniscus of left knee as current injury 12/30/2012  . Cancer ([facility])    Skin, basal cell on nose and eyelid  . DJD (degenerative joint disease)    Low back  . Dyspepsia   . GERD (gastroesophageal reflux disease)   . Heart murmur   . Heart palpitations   . HOH (hard of hearing)   . Hyperlipidemia   . Hypertension   . Lactose intolerance   . Mixed incontinence   . PONV (postoperative nausea and vomiting)   . Vulvar irritation    from urine pad, uses Triamcinolone oint.   Past Surgical History:  Procedure Laterality Date  . ABDOMINAL AORTOGRAM N/A 03/15/2017   Procedure: Abdominal Aortogram;  Surgeon: Wellington Hampshire, MD;  Location: [facility];  Service: Cardiovascular;  Laterality: N/A;  . ABDOMINAL HYSTERECTOMY  1982   Large fibroids and bleeding  . APPENDECTOMY    . BILATERAL SALPINGOOPHORECTOMY  1986  . BONE GRAFT HIP ILIAC CREST     To Left arm  . BREAST REDUCTION SURGERY    . BREAST SURGERY    . CATARACT EXTRACTION W/PHACO Left 07/07/2016   Procedure: CATARACT EXTRACTION PHACO AND INTRAOCULAR LENS PLACEMENT (IOC);  Surgeon: Birder Robson, MD;  Location: [facility];  Service: Ophthalmology;  Laterality: Left;  [place] 01:10 AP% 18.3 CDE 12.91 Fluid pak lot # [id number]  . CATARACT EXTRACTION W/PHACO Right 07/28/2016   Procedure: CATARACT EXTRACTION PHACO AND INTRAOCULAR LENS PLACEMENT ([place]);  Surgeon: Birder Robson, MD;  Location: [facility];  Service: Ophthalmology;  Laterality: Right;  [place] 01:26 AP% 18.5 CDE 16.12 Fluid pack lot #  [id number]  . COLONOSCOPY  07/2016   Dr. Thana Farr  . DILATION AND CURETTAGE OF UTERUS     miscarragesx2  . ESOPHAGOGASTRODUODENOSCOPY     Polyps  . [place]  2006  . KNEE ARTHROSCOPY WITH LATERAL MENISECTOMY Left 12/30/2012   Procedure: KNEE ARTHROSCOPY WITH LATERAL MENISECTOMY;  Surgeon: Johnny Bridge, MD;  Location: [facility];  Service: Orthopedics;  Laterality: Left;  LEFT KNEE SCOPE LATERAL MENISCECTOMY  . LEFT HEART CATH AND CORONARY ANGIOGRAPHY N/A 03/15/2017   Procedure: Left Heart Cath and Coronary Angiography;  Surgeon: Wellington Hampshire, MD;  Location: [facility];  Service: Cardiovascular;  Laterality: N/A;  . MOUTH SURGERY    . skin cancer eyelid  2012  . TONSILLECTOMY     Social History   Tobacco Use  . Smoking status: Former Smoker    Packs/day: 1.00    Years: 12.00    Pack years: 12.00    Types: Cigarettes    Last attempt to quit: 11/24/1963    Years since quitting: 55.4  . Smokeless tobacco: Never Used  Substance Use Topics  . Alcohol use: No    Alcohol/week: 0.0 standard drinks    Comment: Rare  . Drug use: No   Family History  Problem Relation Age of Onset  . Cancer Mother        Colon  . Heart disease Father        CAD  . Diabetes Sister   . Hypothyroidism Sister   . Diabetes Brother   . Leukemia Brother   . Cancer Paternal Aunt        Breast  . Diabetes Sister    Allergies  Allergen Reactions  . Amlodipine Besylate Other (See Comments)    Reaction:  Increases BP  . Antihistamines, Chlorpheniramine-Type Other (See Comments)    Reaction:  Makes pt hyper   . Atenolol Hypertension  . Cefuroxime Axetil Other (See Comments)    Upset stomach  . Chlorthalidone Other (See Comments)  . Ciprofloxacin Other (See Comments)    Upset stomach  . Co Q 10 [Coenzyme Q10] Other (See Comments)    Reaction:  GI upset   . Crestor [Rosuvastatin Calcium] Other (See Comments)    Reaction:  Myalgias   . Dextromethorphan-Guaifenesin  Other (See Comments)    Reaction:  Made pt jittery   . Epinephrine Hives  . Other     Reaction:  Makes pt hyper   . Ramipril Other (See Comments)    Upset stomach  . Sulfamethoxazole-Trimethoprim Other (See Comments)    Upset stomach  . Vitamin D Analogs Other (See Comments)    Reaction:  GI upset    Current Outpatient Medications on File Prior to Visit  Medication Sig Dispense Refill  . acetaminophen (TYLENOL) 500 MG tablet Take 500 mg by mouth every 6 (six) hours as needed.    [name] Kitchen aspirin EC 81 MG tablet Take 1 tablet (81 mg total) by mouth daily. 30 tablet 2  . atorvastatin (LIPITOR) 10 MG tablet TAKE 1 TABLET(10 MG) BY MOUTH DAILY AT 6 PM 90 tablet 3  . Calcium Carbonate-Vitamin D (CALCIUM 600+D PO) Take 1 tablet by mouth daily.     . clobetasol ointment (TEMOVATE) 5.02 % Apply 1 application topically 2 (two) times daily. To affected area 30 g 1  . clorazepate (TRANXENE) 7.5 MG tablet Take 1 tablet (7.5 mg total) by mouth daily as needed. 30 tablet 0  . cyanocobalamin (,VITAMIN B-12,) 1000 MCG/ML injection Inject 1,000 mcg into the muscle every 30 (thirty) days.     . irbesartan (AVAPRO) 300 MG tablet Take 1 tablet (300 mg total) by mouth daily. 90 tablet 3  . omeprazole (PRILOSEC) 20 MG capsule TAKE 1 CAPSULE(20 MG) BY MOUTH DAILY AS NEEDED FOR HEARTBURN 90 capsule 3  . triamcinolone (NASACORT) 55 MCG/ACT AERO nasal inhaler Place 2 sprays into the nose daily. 1 Inhaler 12   No current facility-administered medications on file prior to visit.      Review of Systems  Constitutional: Negative for chills,  diaphoresis, malaise/fatigue and weight loss.  HENT: Negative for hearing loss and sore throat.   Eyes: Negative for blurred vision.  Respiratory: Negative for cough and shortness of breath.   Cardiovascular: Negative for chest pain, palpitations and leg swelling.  Gastrointestinal: Negative for abdominal pain, blood in stool, diarrhea, heartburn, nausea and vomiting.   Musculoskeletal: Negative for myalgias.  Skin: Negative for rash.  Neurological: Negative for dizziness and headaches.  Endo/Heme/Allergies: Negative for polydipsia.  Psychiatric/Behavioral: Negative for depression.    Observations/Objective: Pt sounds well/like her usual self  Not hoarse No sob or cough during visit  Not distressed Mood is good /nl affect  Mentally sharp/no cognitive changes    Assessment and Plan: Problem List Items Addressed This Visit      Cardiovascular and Mediastinum   Essential hypertension - Primary    bp in fair control at this time  BP Readings from Last 1 Encounters:  02/02/19 124/62   No changes needed Most recent labs reviewed  Disc lifstyle change with low sodium diet and exercise  She reports taking ?chlothalidone every 3 d- now tolerates it if not taken daily  This seems to control bp better       Relevant Medications   chlorthalidone (HYGROTON) 25 MG tablet   Episodic atrial fibrillation (HCC)    In NSR No problems lately      Relevant Medications   chlorthalidone (HYGROTON) 25 MG tablet     Endocrine   Right thyroid nodule    She is due for [place] and will re schedule from the pandemic shut down        Other   B12 deficiency    Lab Results  Component Value Date   VITAMINB12 244 04/27/2019   Has missed shots due to the pandemic  Will schedule nurse visit for one      Hyperlipidemia    Disc goals for lipids and reasons to control them Rev last labs with pt Rev low sat fat diet in detail occ misses a dose of atorvastatin- LDL is up a bit        Relevant Medications   chlorthalidone (HYGROTON) 25 MG tablet       Follow Up Instructions: Keep taking care of yourself Walking and outdoor time are excellent   Continue chlorthalidone every 3 days- it sounds like that is helping blood pressure If concerns or changes however let us know   The office will call you to set up a B12 shot    I discussed the assessment and  treatment plan with the patient. The patient was provided an opportunity to ask questions and all were answered. The patient agreed with the plan and demonstrated an understanding of the instructions.   The patient was advised to call back or seek an in-person evaluation if the symptoms worsen or if the condition fails to improve as anticipated.  I provided 24 minutes of non-face-to-face time during this encounter.   Loura Pardon, MD

## 2019-04-28 NOTE — Patient Instructions (Signed)
Keep taking care of yourself Walking and outdoor time are excellent   Continue chlorthalidone every 3 days- it sounds like that is helping blood pressure If concerns or changes however let us know   The office will call you to set up a B12 shot

## 2019-05-04 ENCOUNTER — Ambulatory Visit: Payer: Medicare Other

## 2019-05-09 ENCOUNTER — Ambulatory Visit (INDEPENDENT_AMBULATORY_CARE_PROVIDER_SITE_OTHER): Payer: Medicare Other

## 2019-05-09 DIAGNOSIS — E538 Deficiency of other specified B group vitamins: Secondary | ICD-10-CM

## 2019-05-09 MED ORDER — CYANOCOBALAMIN 1000 MCG/ML IJ SOLN
1000.0000 ug | Freq: Once | INTRAMUSCULAR | Status: AC
  Administered 2019-05-09: 1000 ug via INTRAMUSCULAR

## 2019-05-09 NOTE — Progress Notes (Signed)
Per orders of Dr. Glori Bickers, injection of Vitamin B12 given by Darin Engels.  Administered to left deltoid IM.  Patient tolerated injection well.

## 2019-05-26 ENCOUNTER — Other Ambulatory Visit: Payer: Self-pay | Admitting: Family Medicine

## 2019-05-29 MED ORDER — CLORAZEPATE DIPOTASSIUM 7.5 MG PO TABS: 7.5000 mg | ORAL_TABLET | Freq: Every day | ORAL | 0 refills | Status: DC | PRN

## 2019-05-29 NOTE — Telephone Encounter (Signed)
Name of Medication:clorazepate Name of [place], [place] or Written Date and Quantity:02/01/19 #30 tabs with 0 refills Last Office Visit and Type:CPE on 04/28/19 Next Office Visit and Type:none scheduled Last Controlled Substance Agreement Date:06/2013 Last UDS:06/2013

## 2019-06-04 ENCOUNTER — Other Ambulatory Visit: Payer: Self-pay | Admitting: Family Medicine

## 2019-06-05 NOTE — Telephone Encounter (Signed)
Electronic refill request Atorvastatin Last refill 04/22/18 #90/3 Last office visit 04/28/19 See allergy/contraindiction

## 2019-07-12 ENCOUNTER — Other Ambulatory Visit: Payer: Self-pay

## 2019-07-12 ENCOUNTER — Ambulatory Visit (INDEPENDENT_AMBULATORY_CARE_PROVIDER_SITE_OTHER): Payer: Medicare Other

## 2019-07-12 DIAGNOSIS — E538 Deficiency of other specified B group vitamins: Secondary | ICD-10-CM

## 2019-07-12 MED ORDER — CYANOCOBALAMIN 1000 MCG/ML IJ SOLN
1000.0000 ug | Freq: Once | INTRAMUSCULAR | Status: AC
  Administered 2019-07-12: 1000 ug via INTRAMUSCULAR

## 2019-07-12 NOTE — Progress Notes (Signed)
Per orders of Dr. Glori Bickers, injection of B12-gets it every 6 weeks given by Kris Mouton. Patient tolerated injection well.

## 2019-07-17 ENCOUNTER — Other Ambulatory Visit: Payer: Self-pay

## 2019-07-17 DIAGNOSIS — R6889 Other general symptoms and signs: Secondary | ICD-10-CM | POA: Diagnosis not present

## 2019-07-17 DIAGNOSIS — Z20822 Contact with and (suspected) exposure to covid-19: Secondary | ICD-10-CM

## 2019-07-18 LAB — NOVEL CORONAVIRUS, NAA: SARS-CoV-2, NAA: NOT DETECTED

## 2019-08-09 IMAGING — MR MR HEAD W/O CM
10 series · 48 of 48 positions shown · non-contrast
Comparison: Prior CT from earlier the same day.

CLINICAL DATA: Initial evaluation for acute confusion, blurry
vision, now resolved.

EXAM:
MRI HEAD WITHOUT CONTRAST
TECHNIQUE: Multiplanar, multiecho pulse sequences of the brain and surrounding
structures were obtained without intravenous contrast.

[Series 3: DWI · axial · 3.0mm · 1.80mm/px · z∈[-14,+144]mm · 6 of 54 slices shown (1 of 2)]
[im 1/54]
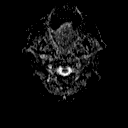
[im 11/54]
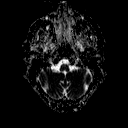
[im 22/54]
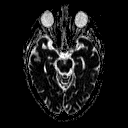
[im 32/54]
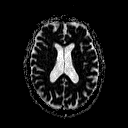
[im 43/54]
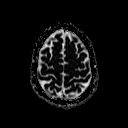
[im 54/54]
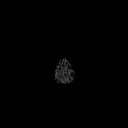

[Series 5: DWI · coronal · 3.0mm · 1.80mm/px · 4 of 48 slices shown (2 of 2)]
[im 1/48]
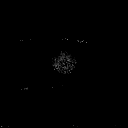
[im 16/48]
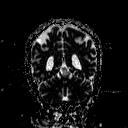
[im 32/48]
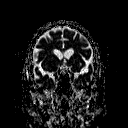
[im 48/48]
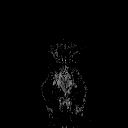

[Series 6: T1 · sagittal · 5.0mm · 0.45mm/px · 2 of 27 slices shown (1 of 2)]
[im 1/27]
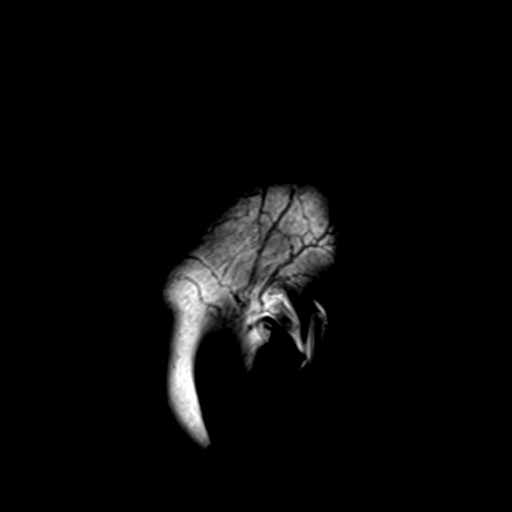
[im 27/27]
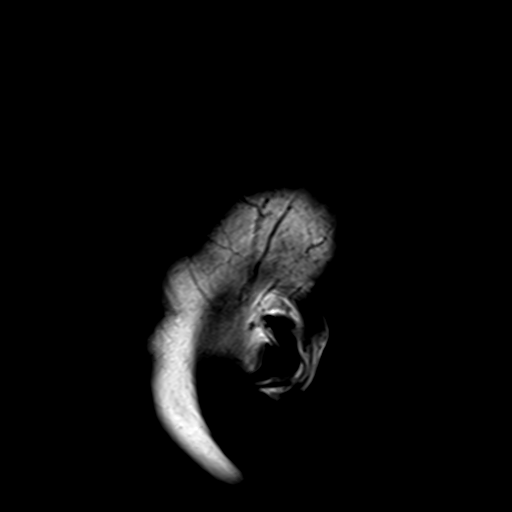

[Series 7: T2 · axial · 5.0mm · 0.60mm/px · z∈[-11,+145]mm · 2 of 25 slices shown (1 of 3)]
[im 1/25]
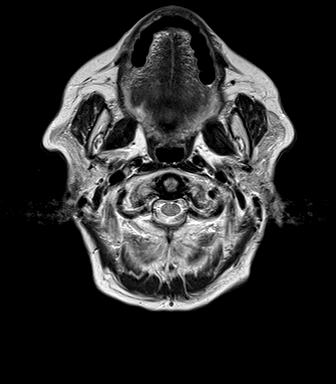
[im 25/25]
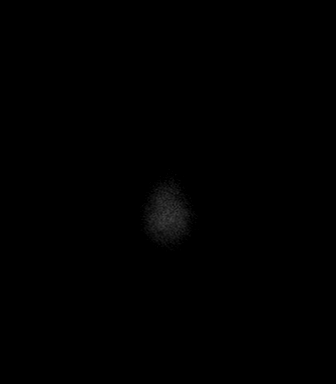

[Series 8: FLAIR · axial · 3.0mm · 0.45mm/px · z∈[-11,+145]mm · 5 of 53 slices shown]
[im 1/53]
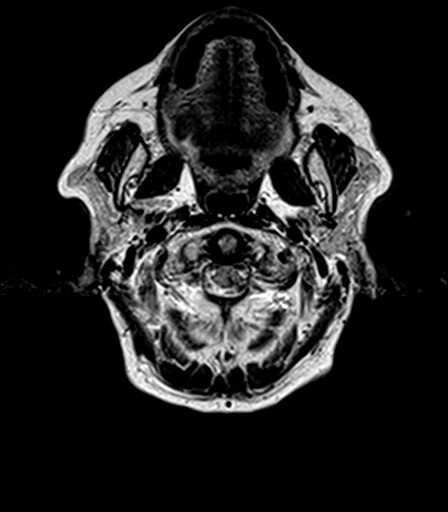
[im 14/53]
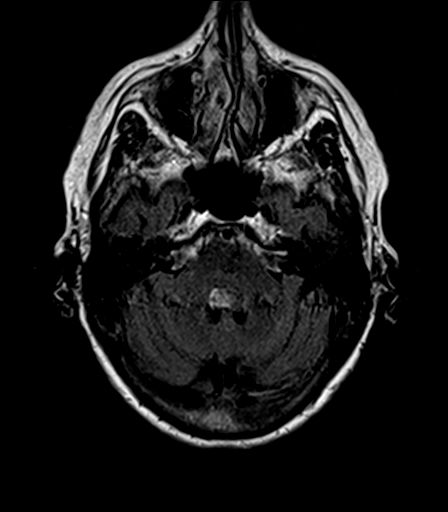
[im 27/53]
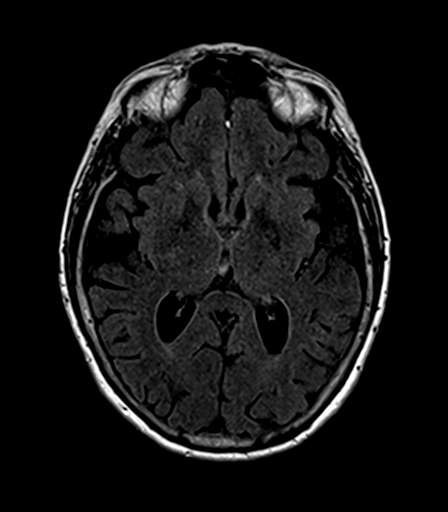
[im 40/53]
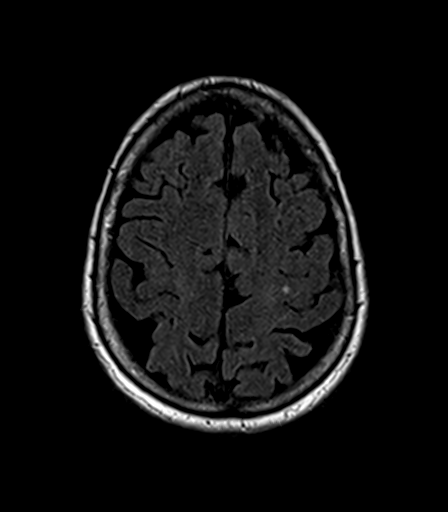
[im 53/53]
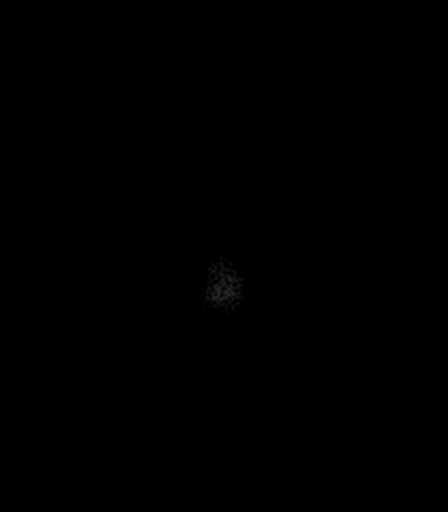

[Series 9: T2 · axial · 5.0mm · 0.45mm/px · z∈[-11,+145]mm · 2 of 25 slices shown (2 of 3)]
[im 1/25]
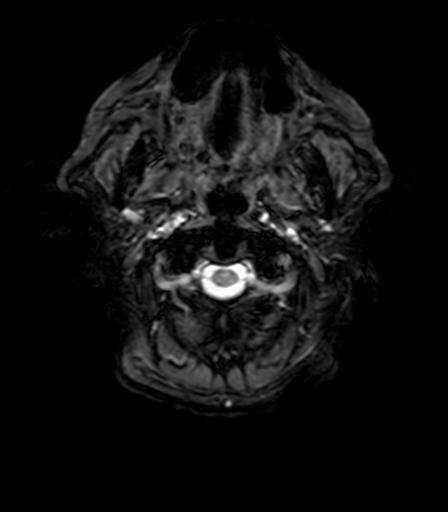
[im 25/25]
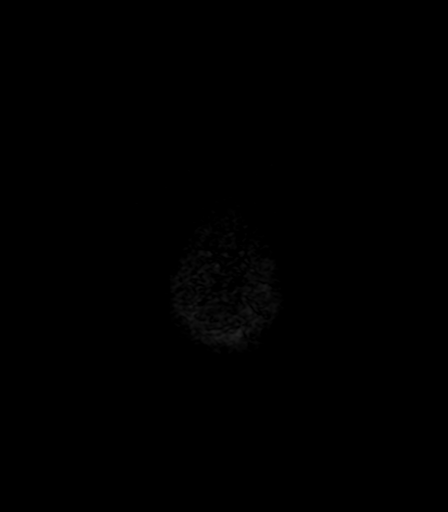

[Series 10: T1 · axial · 1.0mm · 1.00mm/px · z∈[-11,+147]mm · 15 of 160 slices shown (2 of 2)]
[im 1/160]
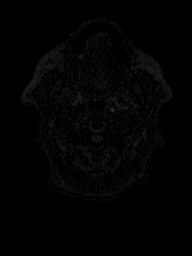
[im 12/160]
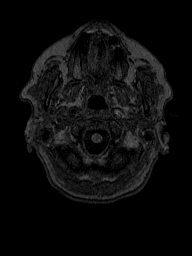
[im 23/160]
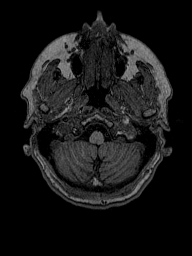
[im 35/160]
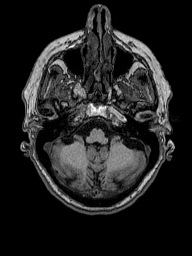
[im 46/160]
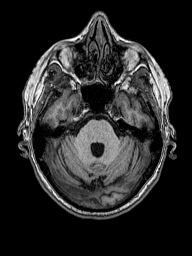
[im 57/160]
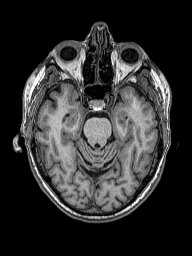
[im 69/160]
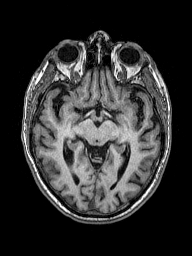
[im 80/160]
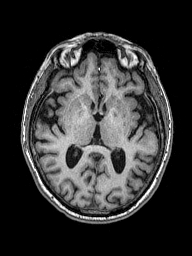
[im 91/160]
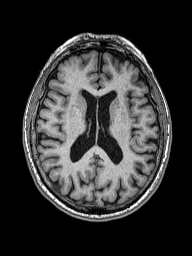
[im 103/160]
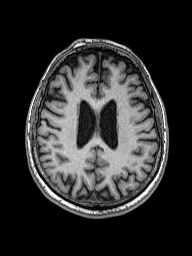
[im 114/160]
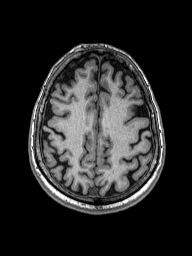
[im 125/160]
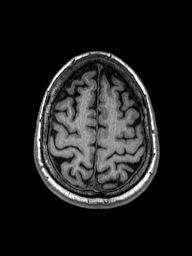
[im 137/160]
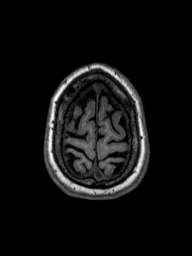
[im 148/160]
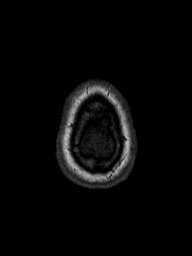
[im 160/160]
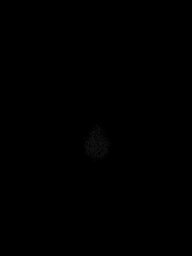

[Series 11: T2 · coronal · 5.0mm · 0.49mm/px · 3 of 30 slices shown (3 of 3)]
[im 1/30]
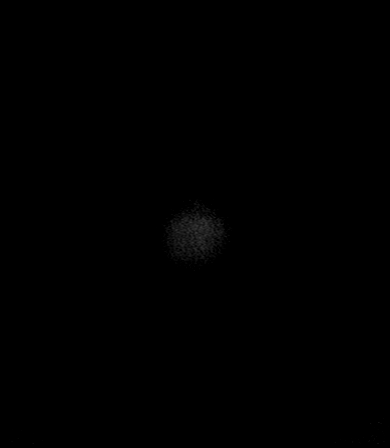
[im 15/30]
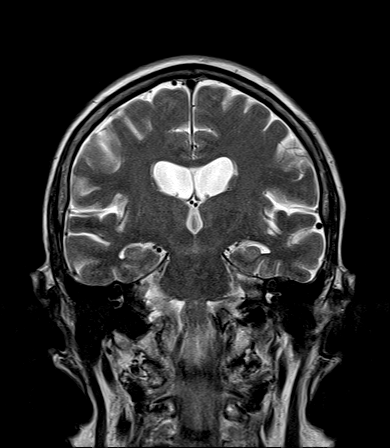
[im 30/30]
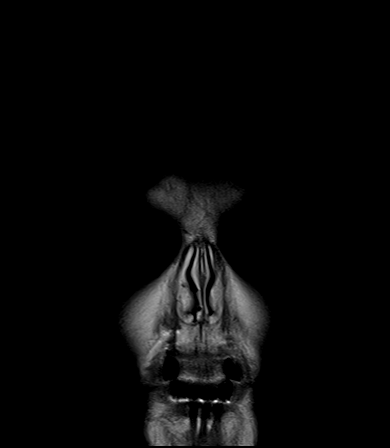

[Series 100: ax (id) · axial · 3.0mm · 1.80mm/px · z∈[-8,+144]mm · 5 of 52 slices shown]
[im 1/52]
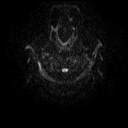
[im 13/52]
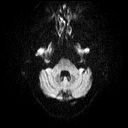
[im 26/52]
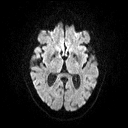
[im 39/52]
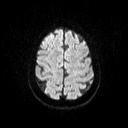
[im 52/52]
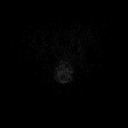

[Series 101: cor (id) · coronal · 3.0mm · 1.80mm/px · 4 of 48 slices shown]
[im 1/48]
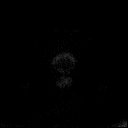
[im 16/48]
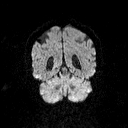
[im 32/48]
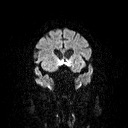
[im 48/48]
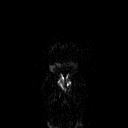

[48 of 48 positions shown; findings below may reference images not displayed]

FINDINGS: Brain: Generalized age appropriate cerebral atrophy. Patchy T2/FLAIR
hyperintensity within the periventricular and deep white matter both
cerebral hemispheres, most like related chronic small vessel
ischemic change, felt to be within normal limits for age. No
abnormal foci of restricted diffusion to suggest acute or subacute
ischemia. Gray-white matter differentiation maintained. No
encephalomalacia to suggest chronic infarction. No foci of
susceptibility artifact to suggest acute or chronic intracranial
hemorrhage.

No mass lesion, midline shift or mass effect. No hydrocephalus. No
extra-axial fluid collection. Normal pituitary gland.

Vascular: Major intracranial vascular flow voids are well
maintained.

Skull and upper cervical spine: Craniocervical junction normal.
Upper cervical spine within normal limits. Normal bone marrow signal
intensity. Small lipoma noted at the right forehead. Scalp soft
tissues demonstrate no acute abnormality.

Sinuses/Orbits: Globes and orbital soft tissues demonstrate no acute
finding. Mild scattered mucosal thickening throughout the paranasal
sinuses. No significant mastoid effusion. Inner ear structures
normal.

Other: None.
IMPRESSION: Normal brain MRI for age. No acute intracranial abnormality
identified.

## 2019-08-10 DIAGNOSIS — Z23 Encounter for immunization: Secondary | ICD-10-CM | POA: Diagnosis not present

## 2019-09-28 ENCOUNTER — Ambulatory Visit (INDEPENDENT_AMBULATORY_CARE_PROVIDER_SITE_OTHER): Payer: Medicare Other | Admitting: *Deleted

## 2019-09-28 DIAGNOSIS — E538 Deficiency of other specified B group vitamins: Secondary | ICD-10-CM

## 2019-09-28 MED ORDER — CYANOCOBALAMIN 1000 MCG/ML IJ SOLN
1000.0000 ug | Freq: Once | INTRAMUSCULAR | Status: AC
  Administered 2019-09-28: 1000 ug via INTRAMUSCULAR

## 2019-09-28 NOTE — Progress Notes (Signed)
Per orders of Dr. Einar Pheasant, injection of B12 inj given by Tammi Sou. Patient tolerated injection well.  PCP out of the office this afternoon

## 2019-11-01 ENCOUNTER — Other Ambulatory Visit: Payer: Self-pay | Admitting: Family Medicine

## 2019-11-01 MED ORDER — CLORAZEPATE DIPOTASSIUM 7.5 MG PO TABS: 7.5000 mg | ORAL_TABLET | Freq: Every day | ORAL | 0 refills | Status: DC | PRN

## 2019-11-01 NOTE — Telephone Encounter (Signed)
Name of Medication:clorazepate Name of [facility]acy[facility] [place] or Written Date and Quantity:04/28/19 #30 tabs with 0 refills Last Office Visit and Type:CPE on 04/28/19 Next Office Visit and Type:none scheduled Last Controlled Substance Agreement Date:06/2013 Last UDS:06/2013

## 2019-11-15 ENCOUNTER — Ambulatory Visit (INDEPENDENT_AMBULATORY_CARE_PROVIDER_SITE_OTHER): Payer: Medicare Other

## 2019-11-15 ENCOUNTER — Other Ambulatory Visit: Payer: Self-pay

## 2019-11-15 DIAGNOSIS — E538 Deficiency of other specified B group vitamins: Secondary | ICD-10-CM

## 2019-11-15 MED ORDER — CYANOCOBALAMIN 1000 MCG/ML IJ SOLN
1000.0000 ug | Freq: Once | INTRAMUSCULAR | Status: AC
  Administered 2019-11-15: 12:00:00 1000 ug via INTRAMUSCULAR

## 2019-11-15 NOTE — Progress Notes (Signed)
Per orders of Dr. Loura Pardon, injection of B12 given by Lurlean Nanny.  Patient tolerated injection well.

## 2019-12-15 ENCOUNTER — Ambulatory Visit: Payer: Medicare Other | Attending: Internal Medicine

## 2019-12-15 DIAGNOSIS — Z23 Encounter for immunization: Secondary | ICD-10-CM

## 2019-12-15 NOTE — Progress Notes (Signed)
   Covid-19 Vaccination Clinic  Name:  Tammy Manning    MRN: [id number] DOB: Sep 25, 1935  12/15/2019  [name] was observed post Covid-19 immunization for 15 minutes without incidence. She was provided with Vaccine Information Sheet and instruction to access the V-Safe system.   [name] was instructed to call 911 with any severe reactions post vaccine: [name] Kitchen Difficulty breathing  . Swelling of your face and throat  . A fast heartbeat  . A bad rash all over your body  . Dizziness and weakness    Immunizations Administered    Name Date Dose VIS Date Route   Pfizer COVID-19 Vaccine 12/15/2019 11:18 AM 0.3 mL 11/03/2019 Intramuscular   Manufacturer: [place]   Lot: [id number]   [place]: [address]-5

## 2019-12-21 ENCOUNTER — Ambulatory Visit: Payer: Medicare Other | Attending: Internal Medicine

## 2019-12-21 DIAGNOSIS — Z20822 Contact with and (suspected) exposure to covid-19: Secondary | ICD-10-CM

## 2019-12-22 ENCOUNTER — Telehealth: Payer: Self-pay | Admitting: *Deleted

## 2019-12-22 LAB — NOVEL CORONAVIRUS, NAA: SARS-CoV-2, NAA: NOT DETECTED

## 2019-12-22 NOTE — Telephone Encounter (Signed)
Pt called to check the status of her covid 19 test result. She is advised that the results are not available at this time. She voiced understanding.

## 2020-01-01 ENCOUNTER — Encounter: Payer: Self-pay | Admitting: Family Medicine

## 2020-01-01 ENCOUNTER — Other Ambulatory Visit: Payer: Self-pay

## 2020-01-01 ENCOUNTER — Ambulatory Visit (INDEPENDENT_AMBULATORY_CARE_PROVIDER_SITE_OTHER)
Admission: RE | Admit: 2020-01-01 | Discharge: 2020-01-01 | Disposition: A | Discharge status: Home / Self Care | Payer: Medicare Other | Source: Ambulatory Visit | Attending: Family Medicine | Admitting: Family Medicine

## 2020-01-01 ENCOUNTER — Ambulatory Visit (INDEPENDENT_AMBULATORY_CARE_PROVIDER_SITE_OTHER): Payer: Medicare Other | Admitting: Family Medicine

## 2020-01-01 VITALS — BP 140/60 | HR 74 | Temp 97.7°F | Ht 64.0 in | Wt 196.4 lb

## 2020-01-01 DIAGNOSIS — I480 Paroxysmal atrial fibrillation: Secondary | ICD-10-CM | POA: Diagnosis not present

## 2020-01-01 DIAGNOSIS — R1031 Right lower quadrant pain: Secondary | ICD-10-CM | POA: Insufficient documentation

## 2020-01-01 DIAGNOSIS — M1611 Unilateral primary osteoarthritis, right hip: Secondary | ICD-10-CM | POA: Diagnosis not present

## 2020-01-01 MED ORDER — OMEPRAZOLE 20 MG PO CPDR: DELAYED_RELEASE_CAPSULE | ORAL | 3 refills | Status: DC

## 2020-01-01 MED ORDER — IRBESARTAN 300 MG PO TABS: 300.0000 mg | ORAL_TABLET | Freq: Every day | ORAL | 3 refills | Status: DC

## 2020-01-01 NOTE — Progress Notes (Signed)
Subjective:    Patient ID: Tammy Manning, female    DOB: 08/27/1935, 85 y.o.   MRN: [id number]  This visit occurred during the SARS-CoV-2 public health emergency.  Safety protocols were in place, including screening questions prior to the visit, additional usage of staff PPE, and extensive cleaning of exam room while observing appropriate contact time as indicated for disinfecting solutions.    HPI Pt presents with leg problems.  Wt Readings from Last 3 Encounters:  01/01/20 196 lb 6 oz (89.1 kg)  04/28/19 194 lb (88 kg)  02/02/19 196 lb 12 oz (89.2 kg)   33.71 kg/m   Pain in R groin  Pain can be dull or sharp or burning- depends what position she is in  Radiates down leg-medially  Noticed with walking - when extending the hip   Sitting down lying back is the most comfortable position   Hurts to bend forward   No weakness in the leg   It keeps her up  Ibuprofen helps  Tylenol does not help    Went to the chiropractor No changes with back    Patient Active Problem List   Diagnosis Date Noted  . Groin pain, right 01/01/2020  . Trochanteric bursitis of right hip 11/09/2018  . Transient global amnesia 05/12/2018  . Right thyroid nodule 05/12/2018  . TIA (transient ischemic attack) 05/09/2018  . Aortic insufficiency 09/10/2015  . Stress reaction 09/08/2015  . Encounter for Medicare annual wellness exam 02/28/2013  . Bucket-handle tear of lateral meniscus of left knee as current injury 12/30/2012  . Episodic atrial fibrillation ([place]) 11/17/2012  . Special screening for malignant neoplasms, colon 01/21/2012  . Hearing loss of both ears 01/14/2012  . Adverse drug reaction 03/12/2011  . HEADACHE, CHRONIC 09/24/2010  . B12 deficiency 03/17/2010  . DYSPEPSIA 03/17/2010  . POSTMENOPAUSAL STATUS 08/20/2008  . Essential hypertension 07/19/2007  . Hyperlipidemia 06/24/2007  . CONSTIPATION 06/24/2007  . IBS 06/24/2007  . DERMATITIS, ATOPIC 06/24/2007  . SKIN CANCER,  HX OF 06/24/2007   Past Medical History:  Diagnosis Date  . Anginal pain ([place])   . Arthritis    Knees  . B12 deficiency    Mild  . Bucket-handle tear of lateral meniscus of left knee as current injury 12/30/2012  . Cancer ([facility])    Skin, basal cell on nose and eyelid  . DJD (degenerative joint disease)    Low back  . Dyspepsia   . GERD (gastroesophageal reflux disease)   . Heart murmur   . Heart palpitations   . HOH (hard of hearing)   . Hyperlipidemia   . Hypertension   . Lactose intolerance   . Mixed incontinence   . PONV (postoperative nausea and vomiting)   . Vulvar irritation    from urine pad, uses Triamcinolone oint.   Past Surgical History:  Procedure Laterality Date  . ABDOMINAL AORTOGRAM N/A 03/15/2017   Procedure: Abdominal Aortogram;  Surgeon: Wellington Hampshire, MD;  Location: [facility];  Service: Cardiovascular;  Laterality: N/A;  . ABDOMINAL HYSTERECTOMY  1982   Large fibroids and bleeding  . APPENDECTOMY    . BILATERAL SALPINGOOPHORECTOMY  1986  . BONE GRAFT HIP ILIAC CREST     To Left arm  . BREAST REDUCTION SURGERY    . BREAST SURGERY    . CATARACT EXTRACTION W/PHACO Left 07/07/2016   Procedure: CATARACT EXTRACTION PHACO AND INTRAOCULAR LENS PLACEMENT (IOC);  Surgeon: Birder Robson, MD;  Location: [facility];  Service: Ophthalmology;  Laterality: Left;  [place] 01:10 AP% 18.3 CDE 12.91 Fluid pak lot # [id number]  . CATARACT EXTRACTION W/PHACO Right 07/28/2016   Procedure: CATARACT EXTRACTION PHACO AND INTRAOCULAR LENS PLACEMENT ([place]);  Surgeon: Birder Robson, MD;  Location: [facility];  Service: Ophthalmology;  Laterality: Right;  [place] 01:26 AP% 18.5 CDE 16.12 Fluid pack lot # [id number]  . COLONOSCOPY  07/2016   Dr. Thana Farr  . DILATION AND CURETTAGE OF UTERUS     miscarragesx2  . ESOPHAGOGASTRODUODENOSCOPY     Polyps  . [place]  2006  . KNEE ARTHROSCOPY WITH LATERAL MENISECTOMY Left 12/30/2012   Procedure: KNEE ARTHROSCOPY WITH LATERAL  MENISECTOMY;  Surgeon: Johnny Bridge, MD;  Location: [facility];  Service: Orthopedics;  Laterality: Left;  LEFT KNEE SCOPE LATERAL MENISCECTOMY  . LEFT HEART CATH AND CORONARY ANGIOGRAPHY N/A 03/15/2017   Procedure: Left Heart Cath and Coronary Angiography;  Surgeon: Wellington Hampshire, MD;  Location: [facility];  Service: Cardiovascular;  Laterality: N/A;  . MOUTH SURGERY    . skin cancer eyelid  2012  . TONSILLECTOMY     Social History   Tobacco Use  . Smoking status: Former Smoker    Packs/day: 1.00    Years: 12.00    Pack years: 12.00    Types: Cigarettes    Quit date: 11/24/1963    Years since quitting: 56.1  . Smokeless tobacco: Never Used  Substance Use Topics  . Alcohol use: No    Alcohol/week: 0.0 standard drinks    Comment: Rare  . Drug use: No   Family History  Problem Relation Age of Onset  . Cancer Mother        Colon  . Heart disease Father        CAD  . Diabetes Sister   . Hypothyroidism Sister   . Diabetes Brother   . Leukemia Brother   . Cancer Paternal Aunt        Breast  . Diabetes Sister    Allergies  Allergen Reactions  . Amlodipine Besylate Other (See Comments)    Reaction:  Increases BP  . Antihistamines, Chlorpheniramine-Type Other (See Comments)    Reaction:  Makes pt hyper   . Atenolol Hypertension  . Cefuroxime Axetil Other (See Comments)    Upset stomach  . Chlorthalidone Other (See Comments)  . Ciprofloxacin Other (See Comments)    Upset stomach  . Co Q 10 [Coenzyme Q10] Other (See Comments)    Reaction:  GI upset   . Crestor [Rosuvastatin Calcium] Other (See Comments)    Reaction:  Myalgias   . Dextromethorphan-Guaifenesin Other (See Comments)    Reaction:  Made pt jittery   . Epinephrine Hives  . Other     Reaction:  Makes pt hyper   . Ramipril Other (See Comments)    Upset stomach  . Sulfamethoxazole-Trimethoprim Other (See Comments)    Upset stomach  . Vitamin D Analogs Other (See Comments)     Reaction:  GI upset    Current Outpatient Medications on File Prior to Visit  Medication Sig Dispense Refill  . acetaminophen (TYLENOL) 500 MG tablet Take 500 mg by mouth every 6 (six) hours as needed.    [name] Kitchen aspirin EC 81 MG tablet Take 1 tablet (81 mg total) by mouth daily. 30 tablet 2  . atorvastatin (LIPITOR) 10 MG tablet TAKE 1 TABLET BY MOUTH EVERY DAY AT 6PM 90 tablet 3  . Calcium Carbonate-Vitamin D (CALCIUM 600+D PO) Take 1  tablet by mouth daily.     . chlorthalidone (HYGROTON) 25 MG tablet Take 25 mg by mouth daily. 1 pill by mouth every 3 days    . clobetasol ointment (TEMOVATE) 6.28 % Apply 1 application topically 2 (two) times daily. To affected area 30 g 1  . clorazepate (TRANXENE) 7.5 MG tablet Take 1 tablet (7.5 mg total) by mouth daily as needed. 30 tablet 0  . cyanocobalamin (,VITAMIN B-12,) 1000 MCG/ML injection Inject 1,000 mcg into the muscle every 30 (thirty) days.     [name] Kitchen triamcinolone (NASACORT) 55 MCG/ACT AERO nasal inhaler Place 2 sprays into the nose daily. 1 Inhaler 12   No current facility-administered medications on file prior to visit.    Review of Systems  Constitutional: Negative for activity change, appetite change, fatigue, fever and unexpected weight change.  HENT: Negative for congestion, ear pain, rhinorrhea, sinus pressure and sore throat.   Eyes: Negative for pain, redness and visual disturbance.  Respiratory: Negative for cough, shortness of breath and wheezing.   Cardiovascular: Negative for chest pain and palpitations.  Gastrointestinal: Negative for abdominal pain, blood in stool, constipation and diarrhea.  Endocrine: Negative for polydipsia and polyuria.  Genitourinary: Negative for dysuria, frequency and urgency.  Musculoskeletal: Positive for arthralgias and gait problem. Negative for back pain, joint swelling and myalgias.  Skin: Negative for pallor and rash.  Allergic/Immunologic: Negative for environmental allergies.  Neurological: Negative  for dizziness, syncope and headaches.  Hematological: Negative for adenopathy. Does not bruise/bleed easily.  Psychiatric/Behavioral: Negative for decreased concentration and dysphoric mood. The patient is not nervous/anxious.        Objective:   Physical Exam Constitutional:      General: She is not in acute distress.    Appearance: Normal appearance. She is obese. She is not ill-appearing.  HENT:     Head: Normocephalic and atraumatic.     Mouth/Throat:     Mouth: Mucous membranes are moist.  Eyes:     General: No scleral icterus.    Conjunctiva/sclera: Conjunctivae normal.     Pupils: Pupils are equal, round, and reactive to light.  Neck:     Vascular: No carotid bruit.  Cardiovascular:     Rate and Rhythm: Normal rate and regular rhythm.  Pulmonary:     Effort: Pulmonary effort is normal. No respiratory distress.     Breath sounds: Normal breath sounds. No wheezing or rales.  Abdominal:     General: Abdomen is flat. Bowel sounds are normal. There is no distension.     Palpations: Abdomen is soft. There is no mass.     Tenderness: There is no abdominal tenderness.     Hernia: No hernia is present.  Musculoskeletal:     Cervical back: Normal range of motion and neck supple. No tenderness.     Right hip: No deformity, lacerations, bony tenderness or crepitus. Decreased range of motion. Normal strength.     Left hip: Normal.     Comments: Groin pain worsens with int/ext rotation of hip Hip flexion limited to 90 deg with pain  Gait favors L leg  No trochanteric tendernes No spinal tenderness  Lymphadenopathy:     Cervical: No cervical adenopathy.  Neurological:     Mental Status: She is alert.           Assessment & Plan:   Problem List Items Addressed This Visit      Cardiovascular and Mediastinum   Episodic atrial fibrillation ([place])    No occurrence  lately       Relevant Medications   irbesartan (AVAPRO) 300 MG tablet     Other   Groin pain, right -  Primary    Worse with movement and rotation of hip Suspect degenerative hip dz Xray today-pending interp Recommend ibuprofen prn with food (caution) Ice/heat Avoid exercises that worsen symptoms  More plan to follow      Relevant Orders   DG Hip Unilat W OR W/O Pelvis 2-3 Views Right

## 2020-01-01 NOTE — Patient Instructions (Signed)
You can take ibuprofen with food up to every 8 hours  Take it only if you need it   Try ice or heat - for pain -see which helps also   Avoid things that make it worse   Using a cane may help if you need it   Xray now  We should have a reading by the end of the day

## 2020-01-01 NOTE — Assessment & Plan Note (Signed)
Worse with movement and rotation of hip Suspect degenerative hip dz Xray today-pending interp Recommend ibuprofen prn with food (caution) Ice/heat Avoid exercises that worsen symptoms  More plan to follow

## 2020-01-01 NOTE — Assessment & Plan Note (Signed)
No occurrence lately

## 2020-01-02 ENCOUNTER — Telehealth: Payer: Self-pay | Admitting: Family Medicine

## 2020-01-02 DIAGNOSIS — R1031 Right lower quadrant pain: Secondary | ICD-10-CM

## 2020-01-02 DIAGNOSIS — M1611 Unilateral primary osteoarthritis, right hip: Secondary | ICD-10-CM | POA: Insufficient documentation

## 2020-01-02 NOTE — Telephone Encounter (Signed)
Ref done Will route to [facility]   Ok to take the ibuprofen as needed  with low dose aspirin  As stated before- always take with food

## 2020-01-02 NOTE — Telephone Encounter (Signed)
-----   Message from Tammi Sou, [place] sent at 01/02/2020  9:16 AM EST ----- Pt viewed results on mychart. She is okay with proceeding with Ortho referral.   #pt did want me to ask Dr. Glori Bickers a question. She said Dr. Glori Bickers told her it's okay to take ibuprofen for pain. Pt wanted to know if it's okay to continue ibuprofen and also the 81 mg asa she takes daily. Please advise

## 2020-01-02 NOTE — Telephone Encounter (Signed)
Pt notified of Dr. Marliss Coots comments

## 2020-01-03 NOTE — Telephone Encounter (Signed)
Appt made and patient aware.  

## 2020-01-05 ENCOUNTER — Ambulatory Visit: Payer: Medicare Other | Attending: Internal Medicine

## 2020-01-05 DIAGNOSIS — Z23 Encounter for immunization: Secondary | ICD-10-CM

## 2020-01-05 NOTE — Progress Notes (Signed)
   Covid-19 Vaccination Clinic  Name:  Tammy Manning    MRN: [id number] DOB: 1935-11-21  01/05/2020  Tammy Manning was observed post Covid-19 immunization for 15 minutes without incidence. She was provided with Vaccine Information Sheet and instruction to access the V-Safe system.   Tammy Manning was instructed to call 911 with any severe reactions post vaccine: [name] Kitchen Difficulty breathing  . Swelling of your face and throat  . A fast heartbeat  . A bad rash all over your body  . Dizziness and weakness    Immunizations Administered    Name Date Dose VIS Date Route   Pfizer COVID-19 Vaccine 01/05/2020  3:22 PM 0.3 mL 11/03/2019 Intramuscular   Manufacturer: [place]   Lot: [id number]   [place]: [id number]

## 2020-01-10 ENCOUNTER — Telehealth: Payer: Self-pay

## 2020-01-10 NOTE — Telephone Encounter (Signed)
OK.  Please schedule

## 2020-01-10 NOTE — Telephone Encounter (Signed)
Patient calling to find out if it is ok to get US done now since she was unable to get it before due to [place]-the order is in active requests and will need to be scheduled by [facility]-I will contact them for scheduling-FYI

## 2020-01-10 NOTE — Telephone Encounter (Signed)
Spoke with Danae Chen at GI and they will reach out to the patient for scheduling-I informed the patient of this and gave her GI phone number if she does not hear from them in the next few days-FYI

## 2020-01-10 NOTE — Telephone Encounter (Signed)
Please advise 

## 2020-01-16 ENCOUNTER — Ambulatory Visit
Admission: RE | Admit: 2020-01-16 | Discharge: 2020-01-16 | Disposition: A | Discharge status: Home / Self Care | Payer: Medicare Other | Source: Ambulatory Visit | Attending: Endocrinology | Admitting: Endocrinology

## 2020-01-16 DIAGNOSIS — E041 Nontoxic single thyroid nodule: Secondary | ICD-10-CM

## 2020-01-16 DIAGNOSIS — E042 Nontoxic multinodular goiter: Secondary | ICD-10-CM | POA: Diagnosis not present

## 2020-01-24 DIAGNOSIS — M5136 Other intervertebral disc degeneration, lumbar region: Secondary | ICD-10-CM | POA: Diagnosis not present

## 2020-01-24 DIAGNOSIS — M545 Low back pain: Secondary | ICD-10-CM | POA: Diagnosis not present

## 2020-01-24 DIAGNOSIS — M25551 Pain in right hip: Secondary | ICD-10-CM | POA: Diagnosis not present

## 2020-01-26 ENCOUNTER — Other Ambulatory Visit: Payer: Self-pay

## 2020-01-29 NOTE — Progress Notes (Signed)
Cardiology Office Note    Date:  02/02/2020   ID:  Tammy Manning, DOB [birth year], MRN [id number]  PCP:  Abner Greenspan, MD  Cardiologist:  Kathlyn Sacramento, MD  Electrophysiologist:  None   Chief Complaint: Follow-up  History of Present Illness:   Tammy Manning is a 85 y.o. female with history of aortic insufficiency, HTN with intolerance to multiple antihypertensive medications, HLD, and B12 deficiency who presents for follow-up of hypertension and aortic valve insufficiency.  Cardiac cath in 02/2017 showed normal coronary arteries and normal LV systolic function.  Abdominal aortogram showed no evidence of renal artery stenosis.  Her most recent echo from 04/2018 showed an EF of 55 to 60% with increased wall thickness in the LV in a pattern of mild LVH, mild aortic valve regurgitation, calcified mitral annulus, mildly dilated left atrium and right ventricle.  She was most recently seen in 01/2019 and was doing well noting diffuse myalgias and was not tolerating chlorthalidone very well.  BP was well controlled at 124/62.  Given her symptoms, chlorthalidone was discontinued and irbesartan was titrated to 300 mg once daily.  She was continued on atorvastatin 10 mg daily.  She comes in doing well from a cardiac perspective.  No chest pain, palpitations, shortness of breath, dizziness, presyncope, syncope, lower extremity swelling, abdominal tension, orthopnea, PND, early satiety.  No falls, hematochezia, or melena.  She is a bit lonely in the setting of social distancing/isolation with COVID-19 though does see close family which seems to brighten her spirits.  No depression.  Earlier this week, she was getting into her son's truck when she had to pull herself up and possibly strained her left calf muscle.  With this, she does note some soreness with ambulation that improves the longer she walks.  Otherwise, she does not have any issues or concerns at this time.   Labs independently  reviewed: 04/2019 - Hgb 12.7, PLT 286, potassium 4.4, BUN 16, serum creatinine 0.82, albumin 3.9, AST/ALT normal, TC 195, TG 118, HDL 64, LDL 107, TSH normal  Past Medical History:  Diagnosis Date  . Anginal pain ([place])   . Arthritis    Knees  . B12 deficiency    Mild  . Bucket-handle tear of lateral meniscus of left knee as current injury 12/30/2012  . Cancer (HCC)    Skin, basal cell on nose and eyelid  . DJD (degenerative joint disease)    Low back  . Dyspepsia   . GERD (gastroesophageal reflux disease)   . Heart murmur   . Heart palpitations   . HOH (hard of hearing)   . Hyperlipidemia   . Hypertension   . Lactose intolerance   . Mixed incontinence   . PONV (postoperative nausea and vomiting)   . Vulvar irritation    from urine pad, uses Triamcinolone oint.    Past Surgical History:  Procedure Laterality Date  . ABDOMINAL AORTOGRAM N/A 03/15/2017   Procedure: Abdominal Aortogram;  Surgeon: Wellington Hampshire, MD;  Location: [place] [facility];  Service: Cardiovascular;  Laterality: N/A;  . ABDOMINAL HYSTERECTOMY  1982   Large fibroids and bleeding  . APPENDECTOMY    . BILATERAL SALPINGOOPHORECTOMY  1986  . BONE GRAFT HIP ILIAC CREST     To Left arm  . BREAST REDUCTION SURGERY    . BREAST SURGERY    . CATARACT EXTRACTION W/PHACO Left 07/07/2016   Procedure: CATARACT EXTRACTION PHACO AND INTRAOCULAR LENS PLACEMENT (IOC);  Surgeon: Birder Robson, MD;  Location:  [facility];  Service: Ophthalmology;  Laterality: Left;  [place] 01:10 AP% 18.3 CDE 12.91 Fluid pak lot # [id number]  . CATARACT EXTRACTION W/PHACO Right 07/28/2016   Procedure: CATARACT EXTRACTION PHACO AND INTRAOCULAR LENS PLACEMENT ([place]);  Surgeon: Birder Robson, MD;  Location: [facility];  Service: Ophthalmology;  Laterality: Right;  [place] 01:26 AP% 18.5 CDE 16.12 Fluid pack lot # [id number]  . COLONOSCOPY  07/2016   Dr. Thana Farr  . DILATION AND CURETTAGE OF UTERUS     miscarragesx2  . ESOPHAGOGASTRODUODENOSCOPY      Polyps  . [place]  2006  . KNEE ARTHROSCOPY WITH LATERAL MENISECTOMY Left 12/30/2012   Procedure: KNEE ARTHROSCOPY WITH LATERAL MENISECTOMY;  Surgeon: Johnny Bridge, MD;  Location: [place];  Service: Orthopedics;  Laterality: Left;  LEFT KNEE SCOPE LATERAL MENISCECTOMY  . LEFT HEART CATH AND CORONARY ANGIOGRAPHY N/A 03/15/2017   Procedure: Left Heart Cath and Coronary Angiography;  Surgeon: Wellington Hampshire, MD;  Location: [place] [facility];  Service: Cardiovascular;  Laterality: N/A;  . MOUTH SURGERY    . skin cancer eyelid  2012  . TONSILLECTOMY      Current Medications: Current Meds  Medication Sig  . acetaminophen (TYLENOL) 500 MG tablet Take 500 mg by mouth every 6 (six) hours as needed.  [name] Kitchen aspirin EC 81 MG tablet Take 1 tablet (81 mg total) by mouth daily.  [name] Kitchen atorvastatin (LIPITOR) 10 MG tablet TAKE 1 TABLET BY MOUTH EVERY DAY AT 6PM  . Calcium Carbonate-Vitamin D (CALCIUM 600+D PO) Take 1 tablet by mouth daily.   . chlorthalidone (HYGROTON) 25 MG tablet Take 25 mg by mouth daily. 1 pill by mouth every 3 days  . clobetasol ointment (TEMOVATE) 0.17 % Apply 1 application topically 2 (two) times daily. To affected area  . clorazepate (TRANXENE) 7.5 MG tablet Take 1 tablet (7.5 mg total) by mouth daily as needed.  . cyanocobalamin (,VITAMIN B-12,) 1000 MCG/ML injection Inject 1,000 mcg into the muscle every 30 (thirty) days.   . Ibuprofen 200 MG CAPS Take by mouth as needed.  . irbesartan (AVAPRO) 300 MG tablet Take 1 tablet (300 mg total) by mouth daily.  [name] Kitchen omeprazole (PRILOSEC) 20 MG capsule TAKE 1 CAPSULE(20 MG) BY MOUTH DAILY AS NEEDED FOR HEARTBURN  . triamcinolone (NASACORT) 55 MCG/ACT AERO nasal inhaler Place 2 sprays into the nose daily.    Allergies:   Amlodipine besylate; Antihistamines, chlorpheniramine-type; Atenolol; Cefuroxime axetil; Chlorthalidone; Ciprofloxacin; Co q 10 [coenzyme q10]; Crestor [rosuvastatin calcium];  Dextromethorphan-guaifenesin; Epinephrine; Other; Ramipril; Sulfamethoxazole-trimethoprim; and Vitamin d analogs   Social History   Socioeconomic History  . Marital status: Married    Spouse name: Not on file  . Number of children: 2  . Years of education: Not on file  . Highest education level: Not on file  Occupational History  . Occupation: Architect: RETIRED  Tobacco Use  . Smoking status: Former Smoker    Packs/day: 1.00    Years: 12.00    Pack years: 12.00    Types: Cigarettes    Quit date: 11/24/1963    Years since quitting: 56.2  . Smokeless tobacco: Never Used  Substance and Sexual Activity  . Alcohol use: No    Alcohol/week: 0.0 standard drinks    Comment: Rare  . Drug use: No  . Sexual activity: Yes  Other Topics Concern  . Not on file  Social History Narrative   Metallurgist.     Social  Determinants of Health   Financial Resource Strain:   . Difficulty of Paying Living Expenses:   Food Insecurity:   . Worried About Charity fundraiser in the Last Year:   . Arboriculturist in the Last Year:   Transportation Needs:   . Film/video editor (Medical):   [name] Kitchen Lack of Transportation (Non-Medical):   Physical Activity:   . Days of Exercise per Week:   . Minutes of Exercise per Session:   Stress:   . Feeling of Stress :   Social Connections:   . Frequency of Communication with Friends and Family:   . Frequency of Social Gatherings with Friends and Family:   . Attends Religious Services:   . Active Member of Clubs or Organizations:   . Attends Archivist Meetings:   [name] Kitchen Marital Status:      Family History:  The patient's family history includes Cancer in her mother and paternal aunt; Diabetes in her brother, sister, and sister; Heart disease in her father; Hypothyroidism in her sister; Leukemia in her brother.  ROS:   Review of Systems  Constitutional: Positive for malaise/fatigue. Negative for chills, diaphoresis, fever and  weight loss.  HENT: Negative for congestion.   Eyes: Negative for discharge and redness.  Respiratory: Negative for cough, sputum production, shortness of breath and wheezing.   Cardiovascular: Negative for chest pain, palpitations, orthopnea, claudication, leg swelling and PND.  Gastrointestinal: Negative for abdominal pain, heartburn, nausea and vomiting.  Musculoskeletal: Positive for myalgias. Negative for falls.       Muscle cramp along the left calf that improves with continued ambulation  Skin: Negative for rash.  Neurological: Positive for weakness. Negative for dizziness, tingling, tremors, sensory change, speech change, focal weakness and loss of consciousness.  Psychiatric/Behavioral: Negative for substance abuse and suicidal ideas. The patient is not nervous/anxious.   All other systems reviewed and are negative.    EKGs/Labs/Other Studies Reviewed:    Studies reviewed were summarized above. The additional studies were reviewed today:  2D echo 04/2018: - Left ventricle: The cavity size was normal. Wall thickness was  increased in a pattern of mild LVH. Systolic function was normal.  The estimated ejection fraction was in the range of 55% to 60%.  Wall motion was normal; there were no regional wall motion  abnormalities. Doppler parameters are consistent with abnormal  left ventricular relaxation (grade 1 diastolic dysfunction).  Doppler parameters are consistent with high ventricular filling  pressure.  - Aortic valve: There was mild regurgitation.  - Mitral valve: Calcified annulus.  - Left atrium: The atrium was mildly dilated.  - Right ventricle: The cavity size was mildly dilated. Systolic  function was normal.  __________  2D echo 01/2018: - Left ventricle: The cavity size was normal. Systolic function was  normal. The estimated ejection fraction was in the range of 60%  to 65%. Wall motion was normal; there were no regional wall  motion  abnormalities. Doppler parameters are consistent with  abnormal left ventricular relaxation (grade 1 diastolic  dysfunction).  - Aortic valve: There was mild regurgitation.  - Mitral valve: There was mild regurgitation.  - Left atrium: The atrium was normal in size.  - Right ventricle: Systolic function was normal.  - Pulmonary arteries: Systolic pressure was within the normal  range.  __________  LHC/abdominal aortogram 02/2017:  The left ventricular systolic function is normal.  LV end diastolic pressure is mildly elevated.  The left ventricular ejection fraction is 55-65%  by visual estimate.   1. Normal coronary arteries. 2. Normal LV systolic function and mildly elevated left ventricular end-diastolic pressure.  3. Mildly calcified aortic valve with no evidence of significant stenosis. 4. Abdominal aortogram was performed to evaluate for uncontrolled hypertension. It showed no evidence of renal artery stenosis, no evidence of aortic aneurysm or significant iliac disease.  Recommendations: Recommend blood pressure control. Uncontrolled hypertension is likely the culprit for her symptoms. __________  2D echo 09/2015: - Left ventricle: The cavity size was normal. There was mild  concentric hypertrophy. Systolic function was normal. The  estimated ejection fraction was in the range of 60% to 65%. Wall  motion was normal; there were no regional wall motion  abnormalities. Doppler parameters are consistent with abnormal  left ventricular relaxation (grade 1 diastolic dysfunction).  - Aortic valve: There was mild regurgitation.  - Mitral valve: Mildly calcified annulus. There was mild  regurgitation.  - Left atrium: The atrium was mildly dilated.  - Pulmonary arteries: Systolic pressure was within the normal  range.    EKG:  EKG is ordered today.  The EKG ordered today demonstrates NSR, 74 bpm, normal axis, no acute ST-T changes  Recent Labs: 04/27/2019:  ALT 10; BUN 16; Creatinine, Ser 0.82; Hemoglobin 12.7; Platelets 286.0; Potassium 4.4; Sodium 141; TSH 1.77  Recent Lipid Panel    Component Value Date/Time   CHOL 195 04/27/2019 1045   TRIG 118.0 04/27/2019 1045   HDL 64.70 04/27/2019 1045   CHOLHDL 3 04/27/2019 1045   VLDL 23.6 04/27/2019 1045   LDLCALC 107 (H) 04/27/2019 1045   LDLDIRECT 105.9 02/08/2013 1050    PHYSICAL EXAM:    VS:  BP 124/62 (BP Location: Left Arm, Patient Position: Sitting, Cuff Size: Normal)   Pulse 74   Ht 5' 4.5" (1.638 m)   Wt 193 lb 4 oz (87.7 kg)   SpO2 98%   BMI 32.66 kg/m   BMI: Body mass index is 32.66 kg/m.  Physical Exam  Constitutional: She is oriented to person, place, and time. She appears well-developed and well-nourished.  HENT:  Head: Normocephalic and atraumatic.  Eyes: Right eye exhibits no discharge. Left eye exhibits no discharge.  Neck: No JVD present.  Cardiovascular: Normal rate, regular rhythm, S1 normal and S2 normal. Exam reveals no distant heart sounds, no friction rub, no midsystolic click and no opening snap.  Murmur heard. High-pitched blowing decrescendo early diastolic murmur is present with a grade of 1/6 at the upper right sternal border radiating to the apex. Pulses:      Dorsalis pedis pulses are 2+ on the right side and 2+ on the left side.       Posterior tibial pulses are 2+ on the right side and 2+ on the left side.  Pulmonary/Chest: Effort normal and breath sounds normal. No respiratory distress. She has no decreased breath sounds. She has no wheezes. She has no rales. She exhibits no tenderness.  Abdominal: Soft. She exhibits no distension. There is no abdominal tenderness.  Musculoskeletal:        General: No edema.     Cervical back: Normal range of motion.  Neurological: She is alert and oriented to person, place, and time.  Skin: Skin is warm and dry. No cyanosis. Nails show no clubbing.  Psychiatric: She has a normal mood and affect. Her speech is  normal and behavior is normal. Judgment and thought content normal.    Wt Readings from Last 3 Encounters:  02/02/20 193 lb 4  oz (87.7 kg)  01/30/20 194 lb 6.4 oz (88.2 kg)  01/01/20 196 lb 6 oz (89.1 kg)     ASSESSMENT & PLAN:   1. Aortic insufficiency: Asymptomatic.  Mild on echo from 2019.  Update echo.   2. HTN: Blood pressure is well controlled today.  She remains on chlorthalidone 1 tab every 3 days without significant cramping.  Continue irbesartan.  Check CMP.   3. HLD: LDL 107 from 04/2019 with normal LFT at that time.  She remains on Lipitor 10 mg daily.  Check lipid panel and direct LDL.  4. Left calf pain: She has 2+ bilateral PT/DP pulses.  Her discomfort improves the longer she ambulates.  Suspect strain injury.  Less likely PAD given history and normal pulses on exam.   Disposition: F/u with Dr. Fletcher Anon or an APP in 12 months, sooner if needed.   Medication Adjustments/Labs and Tests Ordered: Current medicines are reviewed at length with the patient today.  Concerns regarding medicines are outlined above. Medication changes, Labs and Tests ordered today are summarized above and listed in the Patient Instructions accessible in Encounters.   Signed, [name], PA-C 02/02/2020 1:22 PM     [address] [place], [place] [address] [phone]

## 2020-01-30 ENCOUNTER — Ambulatory Visit (INDEPENDENT_AMBULATORY_CARE_PROVIDER_SITE_OTHER): Payer: Medicare Other | Admitting: Endocrinology

## 2020-01-30 ENCOUNTER — Other Ambulatory Visit: Payer: Self-pay

## 2020-01-30 ENCOUNTER — Encounter: Payer: Self-pay | Admitting: Endocrinology

## 2020-01-30 VITALS — BP 136/70 | HR 75 | Ht 64.0 in | Wt 194.4 lb

## 2020-01-30 DIAGNOSIS — E041 Nontoxic single thyroid nodule: Secondary | ICD-10-CM

## 2020-01-30 NOTE — Patient Instructions (Signed)
No thyroid treatment is needed now. If there is no change, please come back for a follow-up appointment in 1 year.

## 2020-01-30 NOTE — Progress Notes (Signed)
Subjective:    Patient ID: Tammy Manning, female    DOB: 1935/07/11, 85 y.o.   MRN: [id number]  HPI Pt returns for f/u of MNG (dx'ed 2019; [place] then showed 3.1 cm right inferior TR 3 nodule meets criteria for biopsy; US-guided bxs showed (BETHESDA CATEGORY I), and (later) BENIGN FOLLICULAR NODULE (BETHESDA CATEGORY II); TSH is mid-normal off any rx; f/u US in 2021 shows annual f/u needed).  She does not notice the nodule.  pt states she feels well in general.   Past Medical History:  Diagnosis Date  . Anginal pain ([place])   . Arthritis    Knees  . B12 deficiency    Mild  . Bucket-handle tear of lateral meniscus of left knee as current injury 12/30/2012  . Cancer (HCC)    Skin, basal cell on nose and eyelid  . DJD (degenerative joint disease)    Low back  . Dyspepsia   . GERD (gastroesophageal reflux disease)   . Heart murmur   . Heart palpitations   . HOH (hard of hearing)   . Hyperlipidemia   . Hypertension   . Lactose intolerance   . Mixed incontinence   . PONV (postoperative nausea and vomiting)   . Vulvar irritation    from urine pad, uses Triamcinolone oint.    Past Surgical History:  Procedure Laterality Date  . ABDOMINAL AORTOGRAM N/A 03/15/2017   Procedure: Abdominal Aortogram;  Surgeon: Wellington Hampshire, MD;  Location: [place] [facility];  Service: Cardiovascular;  Laterality: N/A;  . ABDOMINAL HYSTERECTOMY  1982   Large fibroids and bleeding  . APPENDECTOMY    . BILATERAL SALPINGOOPHORECTOMY  1986  . BONE GRAFT HIP ILIAC CREST     To Left arm  . BREAST REDUCTION SURGERY    . BREAST SURGERY    . CATARACT EXTRACTION W/PHACO Left 07/07/2016   Procedure: CATARACT EXTRACTION PHACO AND INTRAOCULAR LENS PLACEMENT (IOC);  Surgeon: Birder Robson, MD;  Location: [facility];  Service: Ophthalmology;  Laterality: Left;  [place] 01:10 AP% 18.3 CDE 12.91 Fluid pak lot # [id number]  . CATARACT EXTRACTION W/PHACO Right 07/28/2016   Procedure: CATARACT EXTRACTION PHACO AND  INTRAOCULAR LENS PLACEMENT ([place]);  Surgeon: Birder Robson, MD;  Location: [facility];  Service: Ophthalmology;  Laterality: Right;  [place] 01:26 AP% 18.5 CDE 16.12 Fluid pack lot # [id number]  . COLONOSCOPY  07/2016   Dr. Thana Farr  . DILATION AND CURETTAGE OF UTERUS     miscarragesx2  . ESOPHAGOGASTRODUODENOSCOPY     Polyps  . [place]  2006  . KNEE ARTHROSCOPY WITH LATERAL MENISECTOMY Left 12/30/2012   Procedure: KNEE ARTHROSCOPY WITH LATERAL MENISECTOMY;  Surgeon: Johnny Bridge, MD;  Location: [place];  Service: Orthopedics;  Laterality: Left;  LEFT KNEE SCOPE LATERAL MENISCECTOMY  . LEFT HEART CATH AND CORONARY ANGIOGRAPHY N/A 03/15/2017   Procedure: Left Heart Cath and Coronary Angiography;  Surgeon: Wellington Hampshire, MD;  Location: [place] [facility];  Service: Cardiovascular;  Laterality: N/A;  . MOUTH SURGERY    . skin cancer eyelid  2012  . TONSILLECTOMY      Social History   Socioeconomic History  . Marital status: Married    Spouse name: Not on file  . Number of children: 2  . Years of education: Not on file  . Highest education level: Not on file  Occupational History  . Occupation: Architect: RETIRED  Tobacco Use  . Smoking status: Former Smoker  Packs/day: 1.00    Years: 12.00    Pack years: 12.00    Types: Cigarettes    Quit date: 11/24/1963    Years since quitting: 56.2  . Smokeless tobacco: Never Used  Substance and Sexual Activity  . Alcohol use: No    Alcohol/week: 0.0 standard drinks    Comment: Rare  . Drug use: No  . Sexual activity: Yes  Other Topics Concern  . Not on file  Social History Narrative   Metallurgist.     Social Determinants of Health   Financial Resource Strain:   . Difficulty of Paying Living Expenses:   Food Insecurity:   . Worried About Charity fundraiser in the Last Year:   . Arboriculturist in the Last Year:   Transportation Needs:   . Film/video editor (Medical):   [name] Kitchen  Lack of Transportation (Non-Medical):   Physical Activity:   . Days of Exercise per Week:   . Minutes of Exercise per Session:   Stress:   . Feeling of Stress :   Social Connections:   . Frequency of Communication with Friends and Family:   . Frequency of Social Gatherings with Friends and Family:   . Attends Religious Services:   . Active Member of Clubs or Organizations:   . Attends Archivist Meetings:   [name] Kitchen Marital Status:   Intimate Partner Violence:   . Fear of Current or Ex-Partner:   . Emotionally Abused:   [name] Kitchen Physically Abused:   . Sexually Abused:     Current Outpatient Medications on File Prior to Visit  Medication Sig Dispense Refill  . acetaminophen (TYLENOL) 500 MG tablet Take 500 mg by mouth every 6 (six) hours as needed.    [name] Kitchen aspirin EC 81 MG tablet Take 1 tablet (81 mg total) by mouth daily. 30 tablet 2  . atorvastatin (LIPITOR) 10 MG tablet TAKE 1 TABLET BY MOUTH EVERY DAY AT 6PM 90 tablet 3  . Calcium Carbonate-Vitamin D (CALCIUM 600+D PO) Take 1 tablet by mouth daily.     . chlorthalidone (HYGROTON) 25 MG tablet Take 25 mg by mouth daily. 1 pill by mouth every 3 days    . clobetasol ointment (TEMOVATE) 0.10 % Apply 1 application topically 2 (two) times daily. To affected area 30 g 1  . clorazepate (TRANXENE) 7.5 MG tablet Take 1 tablet (7.5 mg total) by mouth daily as needed. 30 tablet 0  . cyanocobalamin (,VITAMIN B-12,) 1000 MCG/ML injection Inject 1,000 mcg into the muscle every 30 (thirty) days.     [name] Kitchen omeprazole (PRILOSEC) 20 MG capsule TAKE 1 CAPSULE(20 MG) BY MOUTH DAILY AS NEEDED FOR HEARTBURN 90 capsule 3  . triamcinolone (NASACORT) 55 MCG/ACT AERO nasal inhaler Place 2 sprays into the nose daily. 1 Inhaler 12   No current facility-administered medications on file prior to visit.    Allergies  Allergen Reactions  . Amlodipine Besylate Other (See Comments)    Reaction:  Increases BP  . Antihistamines, Chlorpheniramine-Type Other (See Comments)     Reaction:  Makes pt hyper   . Atenolol Hypertension  . Cefuroxime Axetil Other (See Comments)    Upset stomach  . Chlorthalidone Other (See Comments)  . Ciprofloxacin Other (See Comments)    Upset stomach  . Co Q 10 [Coenzyme Q10] Other (See Comments)    Reaction:  GI upset   . Crestor [Rosuvastatin Calcium] Other (See Comments)    Reaction:  Myalgias   . Dextromethorphan-Guaifenesin  Other (See Comments)    Reaction:  Made pt jittery   . Epinephrine Hives  . Other     Reaction:  Makes pt hyper   . Ramipril Other (See Comments)    Upset stomach  . Sulfamethoxazole-Trimethoprim Other (See Comments)    Upset stomach  . Vitamin D Analogs Other (See Comments)    Reaction:  GI upset     Family History  Problem Relation Age of Onset  . Cancer Mother        Colon  . Heart disease Father        CAD  . Diabetes Sister   . Hypothyroidism Sister   . Diabetes Brother   . Leukemia Brother   . Cancer Paternal Aunt        Breast  . Diabetes Sister     BP 136/70 (BP Location: Left Arm, Patient Position: Sitting, Cuff Size: Large)   Pulse 75   Ht 5' 4"  (1.626 m)   Wt 194 lb 6.4 oz (88.2 kg)   SpO2 96%   BMI 33.37 kg/m    Review of Systems Denies neck pain.      Objective:   Physical Exam VITAL SIGNS:  See vs page GENERAL: no distress NECK: right thyroid nodule is again noted.   Lab Results  Component Value Date   TSH 1.77 04/27/2019      Assessment & Plan:  MNG: clinically stable.   Patient Instructions  No thyroid treatment is needed now. If there is no change, please come back for a follow-up appointment in 1 year.

## 2020-02-01 ENCOUNTER — Ambulatory Visit (INDEPENDENT_AMBULATORY_CARE_PROVIDER_SITE_OTHER): Payer: Medicare Other

## 2020-02-01 DIAGNOSIS — E538 Deficiency of other specified B group vitamins: Secondary | ICD-10-CM | POA: Diagnosis not present

## 2020-02-01 MED ORDER — CYANOCOBALAMIN 1000 MCG/ML IJ SOLN
1000.0000 ug | Freq: Once | INTRAMUSCULAR | Status: AC
  Administered 2020-02-01: 1000 ug via INTRAMUSCULAR

## 2020-02-01 NOTE — Progress Notes (Signed)
Per orders of Dr. Glori Bickers, injection of 1000 mcg/ml B12 given by Pilar Grammes, CMA in Left Deltoid at pt request. She was very past due for q 6 weeks injection. I advised her to call the office when she got home and schedule her next visit in 6 weeks.  Patient tolerated injection well.

## 2020-02-02 ENCOUNTER — Other Ambulatory Visit: Payer: Self-pay

## 2020-02-02 ENCOUNTER — Ambulatory Visit (INDEPENDENT_AMBULATORY_CARE_PROVIDER_SITE_OTHER): Payer: Medicare Other | Admitting: Physician Assistant

## 2020-02-02 ENCOUNTER — Encounter: Payer: Self-pay | Admitting: Physician Assistant

## 2020-02-02 VITALS — BP 124/62 | HR 74 | Ht 64.5 in | Wt 193.2 lb

## 2020-02-02 DIAGNOSIS — I351 Nonrheumatic aortic (valve) insufficiency: Secondary | ICD-10-CM

## 2020-02-02 DIAGNOSIS — E785 Hyperlipidemia, unspecified: Secondary | ICD-10-CM | POA: Diagnosis not present

## 2020-02-02 DIAGNOSIS — S86812A Strain of other muscle(s) and tendon(s) at lower leg level, left leg, initial encounter: Secondary | ICD-10-CM | POA: Diagnosis not present

## 2020-02-02 DIAGNOSIS — I10 Essential (primary) hypertension: Secondary | ICD-10-CM

## 2020-02-02 MED ORDER — IRBESARTAN 300 MG PO TABS: 300.0000 mg | ORAL_TABLET | Freq: Every day | ORAL | 3 refills | Status: DC

## 2020-02-02 NOTE — Patient Instructions (Signed)
Medication Instructions:  Your physician recommends that you continue on your current medications as directed. Please refer to the Current Medication list given to you today.  *If you need a refill on your cardiac medications before your next appointment, please call your pharmacy*   Lab Work: Your physician recommends that you have lab work today(CMET, lipid w direct)  If you have labs (blood work) drawn today and your tests are completely normal, you will receive your results only by: [name] Kitchen MyChart Message (if you have MyChart) OR . A paper copy in the mail If you have any lab test that is abnormal or we need to change your treatment, we will call you to review the results.   Testing/Procedures: 1- Echo  Please return to [facility] on ______________ at _______________ AM/PM for an Echocardiogram. Your physician has requested that you have an echocardiogram. Echocardiography is a painless test that uses sound waves to create images of your heart. It provides your doctor with information about the size and shape of your heart and how well your heart's chambers and valves are working. This procedure takes approximately one hour. There are no restrictions for this procedure. Please note; depending on visual quality an IV may need to be placed.     Follow-Up: At [facility], you and your health needs are our priority.  As part of our continuing mission to provide you with exceptional heart care, we have created designated Provider Care Teams.  These Care Teams include your primary Cardiologist (physician) and Advanced Practice Providers (APPs -  Physician Assistants and Nurse Practitioners) who all work together to provide you with the care you need, when you need it.  We recommend signing up for the patient portal called "MyChart".  Sign up information is provided on this After Visit Summary.  MyChart is used to connect with patients for Virtual Visits (Telemedicine).  Patients  are able to view lab/test results, encounter notes, upcoming appointments, etc.  Non-urgent messages can be sent to your provider as well.   To learn more about what you can do with MyChart, go to [email or web address].    Your next appointment:   12 month(s)  The format for your next appointment:   In Person  Provider:    You may see Kathlyn Sacramento, MD or [name], PA-C.

## 2020-02-03 LAB — LDL CHOLESTEROL, DIRECT: LDL Direct: 106 mg/dL — ABNORMAL HIGH (ref 0–99)

## 2020-02-03 LAB — COMPREHENSIVE METABOLIC PANEL
ALT: 10 IU/L (ref 0–32)
AST: 17 IU/L (ref 0–40)
Albumin/Globulin Ratio: 2 (ref 1.2–2.2)
Albumin: 4.3 g/dL (ref 3.6–4.6)
Alkaline Phosphatase: 85 IU/L (ref 39–117)
BUN/Creatinine Ratio: 23 (ref 12–28)
BUN: 20 mg/dL (ref 8–27)
Bilirubin Total: 0.2 mg/dL (ref 0.0–1.2)
CO2: 23 mmol/L (ref 20–29)
Calcium: 9.7 mg/dL (ref 8.7–10.3)
Chloride: 103 mmol/L (ref 96–106)
Creatinine, Ser: 0.88 mg/dL (ref 0.57–1.00)
GFR calc Af Amer: 71 mL/min/{1.73_m2} (ref >59)
GFR calc non Af Amer: 61 mL/min/{1.73_m2} (ref >59)
Globulin, Total: 2.2 g/dL (ref 1.5–4.5)
Glucose: 137 mg/dL — ABNORMAL HIGH (ref 65–99)
Potassium: 4.4 mmol/L (ref 3.5–5.2)
Sodium: 141 mmol/L (ref 134–144)
Total Protein: 6.5 g/dL (ref 6.0–8.5)

## 2020-02-03 LAB — LIPID PANEL
Chol/HDL Ratio: 2.5 ratio (ref 0.0–4.4)
Cholesterol, Total: 196 mg/dL (ref 100–199)
HDL: 80 mg/dL (ref >39)
LDL Chol Calc (NIH): 93 mg/dL (ref 0–99)
Triglycerides: 136 mg/dL (ref 0–149)
VLDL Cholesterol Cal: 23 mg/dL (ref 5–40)

## 2020-02-06 ENCOUNTER — Telehealth: Payer: Self-pay

## 2020-02-06 NOTE — Telephone Encounter (Signed)
Attempted to call patient. [facility] 02/06/2020

## 2020-02-06 NOTE — Telephone Encounter (Signed)
-----   Message from Theora Gianotti, NP sent at 02/05/2020 10:08 AM EDT ----- Renal fxn/lytes, liver function tests wnl.  Cholesterol numbers are stable w/ LDL of 93.  In the absence of coronary artery disease, continue current dose of lipitor.

## 2020-02-12 NOTE — Telephone Encounter (Signed)
Attempted to call patient. [facility] 02/12/2020

## 2020-02-13 NOTE — Telephone Encounter (Signed)
Call to patient to review results. No further questions or orders at this time.   Confirmed upcoming u/s appt next month.   Advised pt to call for any further questions or concerns.

## 2020-02-21 ENCOUNTER — Encounter: Payer: Self-pay | Admitting: Family Medicine

## 2020-02-21 ENCOUNTER — Other Ambulatory Visit: Payer: Self-pay | Admitting: *Deleted

## 2020-02-21 DIAGNOSIS — M25551 Pain in right hip: Secondary | ICD-10-CM | POA: Diagnosis not present

## 2020-02-21 MED ORDER — CLOBETASOL PROPIONATE 0.05 % EX OINT: 1.0000 "application " | TOPICAL_OINTMENT | Freq: Two times a day (BID) | CUTANEOUS | 1 refills | Status: DC

## 2020-02-21 NOTE — Telephone Encounter (Signed)
Pt is requesting a refill of clobetasol ointment. Last filled on 07/11/18 #30g with 1 refill  [facility] (on file)

## 2020-02-27 ENCOUNTER — Ambulatory Visit: Payer: Medicare Other

## 2020-02-27 ENCOUNTER — Encounter: Payer: Self-pay | Admitting: Podiatry

## 2020-02-27 ENCOUNTER — Other Ambulatory Visit: Payer: Self-pay

## 2020-02-27 ENCOUNTER — Ambulatory Visit (INDEPENDENT_AMBULATORY_CARE_PROVIDER_SITE_OTHER): Payer: Medicare Other | Admitting: Podiatry

## 2020-02-27 DIAGNOSIS — M21619 Bunion of unspecified foot: Secondary | ICD-10-CM | POA: Diagnosis not present

## 2020-02-27 DIAGNOSIS — M2011 Hallux valgus (acquired), right foot: Secondary | ICD-10-CM | POA: Diagnosis not present

## 2020-02-27 DIAGNOSIS — M79672 Pain in left foot: Secondary | ICD-10-CM

## 2020-02-27 DIAGNOSIS — M2012 Hallux valgus (acquired), left foot: Secondary | ICD-10-CM | POA: Diagnosis not present

## 2020-02-27 NOTE — Progress Notes (Signed)
Subjective:  Patient ID: Tammy Manning, female    DOB: 26-Mar-1935,  MRN: [id number]  Chief Complaint  Patient presents with  . Foot Pain    pt has left foot possible bunion, located on the medial side of the left foot, pt states that it has been going on for a couple of years, pt states that she has no pain when walking on it, but is looking to get it looked at.    85 y.o. female presents with the above complaint. Patient presents with bilateral bunion deformity with left side little bit worse on the right side. Patient states that there is no pain associated with it. It has been going on for couple years has progressively gotten worse. But she has been walking a lot and she likes to walk so she just wanted to get it evaluated. She states that she cannot wear good supportive sneakers. She just wanted to talk to me about further treatment options if any arches discussed bunion overall. She denies any other acute complaints. She is wearing Environmental health practitioner today.  Review of Systems: Negative except as noted in the HPI. Denies N/V/F/Ch.  Past Medical History:  Diagnosis Date  . Anginal pain ([place])   . Arthritis    Knees  . B12 deficiency    Mild  . Bucket-handle tear of lateral meniscus of left knee as current injury 12/30/2012  . Cancer (HCC)    Skin, basal cell on nose and eyelid  . DJD (degenerative joint disease)    Low back  . Dyspepsia   . GERD (gastroesophageal reflux disease)   . Heart murmur   . Heart palpitations   . HOH (hard of hearing)   . Hyperlipidemia   . Hypertension   . Lactose intolerance   . Mixed incontinence   . PONV (postoperative nausea and vomiting)   . Vulvar irritation    from urine pad, uses Triamcinolone oint.    Current Outpatient Medications:  .  acetaminophen (TYLENOL) 500 MG tablet, Take 500 mg by mouth every 6 (six) hours as needed., Disp: , Rfl:  .  aspirin EC 81 MG tablet, Take 1 tablet (81 mg total) by mouth daily., Disp: 30 tablet, Rfl:  2 .  atorvastatin (LIPITOR) 10 MG tablet, TAKE 1 TABLET BY MOUTH EVERY DAY AT 6PM, Disp: 90 tablet, Rfl: 3 .  Calcium Carbonate-Vitamin D (CALCIUM 600+D PO), Take 1 tablet by mouth daily. , Disp: , Rfl:  .  chlorthalidone (HYGROTON) 25 MG tablet, Take 25 mg by mouth daily. 1 pill by mouth every 3 days, Disp: , Rfl:  .  clobetasol ointment (TEMOVATE) 9.37 %, Apply 1 application topically 2 (two) times daily. To affected area, Disp: 30 g, Rfl: 1 .  clorazepate (TRANXENE) 7.5 MG tablet, Take 1 tablet (7.5 mg total) by mouth daily as needed., Disp: 30 tablet, Rfl: 0 .  cyanocobalamin (,VITAMIN B-12,) 1000 MCG/ML injection, Inject 1,000 mcg into the muscle every 30 (thirty) days. , Disp: , Rfl:  .  Ibuprofen 200 MG CAPS, Take by mouth as needed., Disp: , Rfl:  .  irbesartan (AVAPRO) 300 MG tablet, Take 1 tablet (300 mg total) by mouth daily., Disp: 90 tablet, Rfl: 3 .  omeprazole (PRILOSEC) 20 MG capsule, TAKE 1 CAPSULE(20 MG) BY MOUTH DAILY AS NEEDED FOR HEARTBURN, Disp: 90 capsule, Rfl: 3 .  triamcinolone (NASACORT) 55 MCG/ACT AERO nasal inhaler, Place 2 sprays into the nose daily., Disp: 1 Inhaler, Rfl: 12  Social History   Tobacco  Use  Smoking Status Former Smoker  . Packs/day: 1.00  . Years: 12.00  . Pack years: 12.00  . Types: Cigarettes  . Quit date: 11/24/1963  . Years since quitting: 56.2  Smokeless Tobacco Never Used    Allergies  Allergen Reactions  . Amlodipine Besylate Other (See Comments)    Reaction:  Increases BP  . Antihistamines, Chlorpheniramine-Type Other (See Comments)    Reaction:  Makes pt hyper   . Atenolol Hypertension  . Cefuroxime Axetil Other (See Comments)    Upset stomach  . Chlorthalidone Other (See Comments)  . Ciprofloxacin Other (See Comments)    Upset stomach  . Co Q 10 [Coenzyme Q10] Other (See Comments)    Reaction:  GI upset   . Crestor [Rosuvastatin Calcium] Other (See Comments)    Reaction:  Myalgias   . Dextromethorphan-Guaifenesin Other  (See Comments)    Reaction:  Made pt jittery   . Epinephrine Hives  . Other     Reaction:  Makes pt hyper   . Ramipril Other (See Comments)    Upset stomach  . Sulfamethoxazole-Trimethoprim Other (See Comments)    Upset stomach  . Vitamin D Analogs Other (See Comments)    Reaction:  GI upset    Objective:  There were no vitals filed for this visit. There is no height or weight on file to calculate BMI. Constitutional Well developed. Well nourished.  Vascular Dorsalis pedis pulses palpable bilaterally. Posterior tibial pulses palpable bilaterally. Capillary refill normal to all digits.  No cyanosis or clubbing noted. Pedal hair growth normal.  Neurologic Normal speech. Oriented to person, place, and time. Epicritic sensation to light touch grossly present bilaterally.  Dermatologic Nails well groomed and normal in appearance. No open wounds. No skin lesions.  Orthopedic: Normal joint ROM without pain or crepitus bilaterally. Hallux abductovalgus deformity present. Tracking not track bound. No intra-articular pain bilaterally. Left 1st MPJ full range of motion. Left 1st TMT without gross hypermobility. Right 1st MPJ full range of motion  Right 1st TMT without gross hypermobility. Lesser digital contractures absent bilaterally.   Radiographs: Taken and reviewed. None. Patient refused  Assessment:   1. Foot pain, left   2. Bunion   3. Hallux abducto valgus, bilateral    Plan:  Patient was evaluated and treated and all questions answered.  Hallux abductovalgus deformity,  -I discussed with the patient the etiology of bunion deformity and various treatment options were discussed. I explained to the patient the mechanics of how the bunion is formed and the progressive nature of it. I discussed with the patient in extensive details about shoe gear modifications and to new balance or Brooks shoes. Patient states understanding and will obtain new shoes. She is currently wearing  sneakers sketchers and I have instructed her to stop wearing his current sketchers. Also discussed with her over-the-counter orthotics power steps versus custom made orthotics. For now patient will try new shoes as well as power steps. If she is not getting the full amount of relief we will consider doing custom-made orthotics next time. -I will see her back as needed if she continues to have pain we'll consider doing custom-made orthotics in the future.  No follow-ups on file.

## 2020-03-12 DIAGNOSIS — R682 Dry mouth, unspecified: Secondary | ICD-10-CM | POA: Diagnosis not present

## 2020-03-12 DIAGNOSIS — D1039 Benign neoplasm of other parts of mouth: Secondary | ICD-10-CM | POA: Diagnosis not present

## 2020-03-12 DIAGNOSIS — H903 Sensorineural hearing loss, bilateral: Secondary | ICD-10-CM | POA: Diagnosis not present

## 2020-03-14 ENCOUNTER — Other Ambulatory Visit: Payer: Self-pay

## 2020-03-14 ENCOUNTER — Ambulatory Visit (INDEPENDENT_AMBULATORY_CARE_PROVIDER_SITE_OTHER): Payer: Medicare Other

## 2020-03-14 DIAGNOSIS — I351 Nonrheumatic aortic (valve) insufficiency: Secondary | ICD-10-CM | POA: Diagnosis not present

## 2020-03-15 ENCOUNTER — Ambulatory Visit (INDEPENDENT_AMBULATORY_CARE_PROVIDER_SITE_OTHER): Payer: Medicare Other | Admitting: Family Medicine

## 2020-03-15 ENCOUNTER — Telehealth: Payer: Self-pay | Admitting: *Deleted

## 2020-03-15 ENCOUNTER — Encounter: Payer: Self-pay | Admitting: Family Medicine

## 2020-03-15 VITALS — BP 144/90 | HR 67 | Temp 97.4°F | Ht 64.5 in | Wt 195.4 lb

## 2020-03-15 DIAGNOSIS — E538 Deficiency of other specified B group vitamins: Secondary | ICD-10-CM

## 2020-03-15 DIAGNOSIS — M545 Low back pain, unspecified: Secondary | ICD-10-CM | POA: Insufficient documentation

## 2020-03-15 DIAGNOSIS — R682 Dry mouth, unspecified: Secondary | ICD-10-CM | POA: Diagnosis not present

## 2020-03-15 DIAGNOSIS — M1611 Unilateral primary osteoarthritis, right hip: Secondary | ICD-10-CM | POA: Diagnosis not present

## 2020-03-15 MED ORDER — CYANOCOBALAMIN 1000 MCG/ML IJ SOLN
1000.0000 ug | Freq: Once | INTRAMUSCULAR | Status: AC
  Administered 2020-03-15: 1000 ug via INTRAMUSCULAR

## 2020-03-15 MED ORDER — METHOCARBAMOL 500 MG PO TABS: 500.0000 mg | ORAL_TABLET | Freq: Three times a day (TID) | ORAL | 1 refills | Status: DC | PRN

## 2020-03-15 MED ORDER — OMEPRAZOLE 20 MG PO CPDR: DELAYED_RELEASE_CAPSULE | ORAL | 1 refills | Status: DC

## 2020-03-15 NOTE — Assessment & Plan Note (Signed)
Causing R sided groin pain - worse with walking  Recently had to start using a cane  Interarticular injection with ortho (Dr Mardelle Matte) helped for 3 weeks-now back to square one Was told she cannot have another injection until June  Altered gait from hip pain is now causing pain in other areas (back) Since ibuprofen helps-we will inc her omeprazole to bid for more GI protection (prn)  Also told to takensaid  with food (she takes bid)  Sent for last orthopedic note to review  Reviewed last xray showing mild oa R hip joint inst pt to call the ortho office to discuss the next step in tx

## 2020-03-15 NOTE — Progress Notes (Signed)
Subjective:    Patient ID: Tammy Manning, female    DOB: 1935-11-08, 85 y.o.   MRN: [id number]  This visit occurred during the SARS-CoV-2 public health emergency.  Safety protocols were in place, including screening questions prior to the visit, additional usage of staff PPE, and extensive cleaning of exam room while observing appropriate contact time as indicated for disinfecting solutions.    HPI Pt presents with c/o back and hip pain worse on the right side   Wt Readings from Last 3 Encounters:  03/15/20 195 lb 6 oz (88.6 kg)  02/02/20 193 lb 4 oz (87.7 kg)  01/30/20 194 lb 6.4 oz (88.2 kg)   33.02 kg/m This am worst it has been  Felt unsteady on right leg   Same groin pain she had before  Now back is bothering her - in the middle of her back  Has aches in legs but no radiation  Now left hip bothers her on the outside   Hard to get comfortable at night   H/o trochanteric bursitis of R hip in the past (Dr Dion Saucier) Also mild R OA of hip   Had a visit with him for this Gave her a shot in the hip on march 2nd - no pain for 3 weeks  Then it started again  She followed up and he said she could have next shot in 3 months   Using a cane today-helpful  Pain is affecting gait   She has done PT for hip- does her exercises once per day   No numbness  No foot drop or loss of strength   Last hip film: DG Hip Unilat W OR W/O Pelvis 2-3 Views Right (Accession [id number]) (Order [id number]) Imaging Date: 01/01/2020 Department: [facility] [address] Released By: Alvina Chou Authorizing: Shana Zavaleta, Audrie Gallus, MD  Exam Status  Status  Final [99]  PACS Intelerad Image Link  Show images for DG Hip Unilat W OR W/O Pelvis 2-3 Views Right  Study Result  CLINICAL DATA:  Right hip/groin pain. Pain worse when walking or flexing.  EXAM: DG HIP (WITH OR WITHOUT PELVIS) 2-3V RIGHT  COMPARISON:  None.  FINDINGS: The cortical margins of the bony pelvis and right  hip are intact. No fracture. Pubic symphysis and sacroiliac joints are congruent. No evidence of bony destruction or focal bone lesion. Mild superior hip joint space narrowing and lateral acetabular spurring. No radiographic evidence of avascular necrosis.  IMPRESSION: Mild osteoarthritis of the right hip.   Electronically Signed   By: Narda Rutherford M.D.   On: 01/01/2020 18:02   Has taken low dose ibuprofen (pm dose bothered her stomach)   Stays on tylenol   Uses heat and ice both on hip and back   Patient Active Problem List   Diagnosis Date Noted  . Low back pain 03/15/2020  . Osteoarthritis of right hip 01/02/2020  . Groin pain, right 01/01/2020  . Trochanteric bursitis of right hip 11/09/2018  . Transient global amnesia 05/12/2018  . Right thyroid nodule 05/12/2018  . TIA (transient ischemic attack) 05/09/2018  . Aortic insufficiency 09/10/2015  . Stress reaction 09/08/2015  . Encounter for Medicare annual wellness exam 02/28/2013  . Bucket-handle tear of lateral meniscus of left knee as current injury 12/30/2012  . Episodic atrial fibrillation (HCC) 11/17/2012  . Special screening for malignant neoplasms, colon 01/21/2012  . Hearing loss of both ears 01/14/2012  . Adverse drug reaction 03/12/2011  . HEADACHE, CHRONIC 09/24/2010  .  B12 deficiency 03/17/2010  . DYSPEPSIA 03/17/2010  . POSTMENOPAUSAL STATUS 08/20/2008  . Essential hypertension 07/19/2007  . Hyperlipidemia 06/24/2007  . CONSTIPATION 06/24/2007  . IBS 06/24/2007  . DERMATITIS, ATOPIC 06/24/2007  . SKIN CANCER, HX OF 06/24/2007   Past Medical History:  Diagnosis Date  . Anginal pain (HCC)   . Arthritis    Knees  . B12 deficiency    Mild  . Bucket-handle tear of lateral meniscus of left knee as current injury 12/30/2012  . Cancer (HCC)    Skin, basal cell on nose and eyelid  . DJD (degenerative joint disease)    Low back  . Dyspepsia   . GERD (gastroesophageal reflux disease)   . Heart  murmur   . Heart palpitations   . HOH (hard of hearing)   . Hyperlipidemia   . Hypertension   . Lactose intolerance   . Mixed incontinence   . PONV (postoperative nausea and vomiting)   . Vulvar irritation    from urine pad, uses Triamcinolone oint.   Past Surgical History:  Procedure Laterality Date  . ABDOMINAL AORTOGRAM N/A 03/15/2017   Procedure: Abdominal Aortogram;  Surgeon: Iran Ouch, MD;  Location: [facility];  Service: Cardiovascular;  Laterality: N/A;  . ABDOMINAL HYSTERECTOMY  1982   Large fibroids and bleeding  . APPENDECTOMY    . BILATERAL SALPINGOOPHORECTOMY  1986  . BONE GRAFT HIP ILIAC CREST     To Left arm  . BREAST REDUCTION SURGERY    . BREAST SURGERY    . CATARACT EXTRACTION W/PHACO Left 07/07/2016   Procedure: CATARACT EXTRACTION PHACO AND INTRAOCULAR LENS PLACEMENT (IOC);  Surgeon: Galen Manila, MD;  Location: [facility];  Service: Ophthalmology;  Laterality: Left;  [place] 01:10 AP% 18.3 CDE 12.91 Fluid pak lot # [id number]  . CATARACT EXTRACTION W/PHACO Right 07/28/2016   Procedure: CATARACT EXTRACTION PHACO AND INTRAOCULAR LENS PLACEMENT (IOC);  Surgeon: Galen Manila, MD;  Location: [facility];  Service: Ophthalmology;  Laterality: Right;  [place] 01:26 AP% 18.5 CDE 16.12 Fluid pack lot # [id number]  . COLONOSCOPY  07/2016   Dr. Lottie Mussel  . DILATION AND CURETTAGE OF UTERUS     miscarragesx2  . ESOPHAGOGASTRODUODENOSCOPY     Polyps  . HEMORRHOID SURGERY  2006  . KNEE ARTHROSCOPY WITH LATERAL MENISECTOMY Left 12/30/2012   Procedure: KNEE ARTHROSCOPY WITH LATERAL MENISECTOMY;  Surgeon: Eulas Post, MD;  Location: [facility];  Service: Orthopedics;  Laterality: Left;  LEFT KNEE SCOPE LATERAL MENISCECTOMY  . LEFT HEART CATH AND CORONARY ANGIOGRAPHY N/A 03/15/2017   Procedure: Left Heart Cath and Coronary Angiography;  Surgeon: Iran Ouch, MD;  Location: [facility];  Service: Cardiovascular;  Laterality: N/A;  . MOUTH  SURGERY    . skin cancer eyelid  2012  . TONSILLECTOMY     Social History   Tobacco Use  . Smoking status: Former Smoker    Packs/day: 1.00    Years: 12.00    Pack years: 12.00    Types: Cigarettes    Quit date: 11/24/1963    Years since quitting: 56.3  . Smokeless tobacco: Never Used  Substance Use Topics  . Alcohol use: No    Alcohol/week: 0.0 standard drinks    Comment: Rare  . Drug use: No   Family History  Problem Relation Age of Onset  . Cancer Mother        Colon  . Heart disease Father  CAD  . Diabetes Sister   . Hypothyroidism Sister   . Diabetes Brother   . Leukemia Brother   . Cancer Paternal Aunt        Breast  . Diabetes Sister    Allergies  Allergen Reactions  . Amlodipine Besylate Other (See Comments)    Reaction:  Increases BP  . Antihistamines, Chlorpheniramine-Type Other (See Comments)    Reaction:  Makes pt hyper   . Atenolol Hypertension  . Cefuroxime Axetil Other (See Comments)    Upset stomach  . Chlorthalidone Other (See Comments)  . Ciprofloxacin Other (See Comments)    Upset stomach  . Co Q 10 [Coenzyme Q10] Other (See Comments)    Reaction:  GI upset   . Crestor [Rosuvastatin Calcium] Other (See Comments)    Reaction:  Myalgias   . Dextromethorphan-Guaifenesin Other (See Comments)    Reaction:  Made pt jittery   . Epinephrine Hives  . Other     Reaction:  Makes pt hyper   . Ramipril Other (See Comments)    Upset stomach  . Sulfamethoxazole-Trimethoprim Other (See Comments)    Upset stomach  . Vitamin D Analogs Other (See Comments)    Reaction:  GI upset    Current Outpatient Medications on File Prior to Visit  Medication Sig Dispense Refill  . acetaminophen (TYLENOL) 500 MG tablet Take 500 mg by mouth every 6 (six) hours as needed.    [name] Kitchen aspirin EC 81 MG tablet Take 1 tablet (81 mg total) by mouth daily. 30 tablet 2  . atorvastatin (LIPITOR) 10 MG tablet TAKE 1 TABLET BY MOUTH EVERY DAY AT 6PM 90 tablet 3  . Calcium  Carbonate-Vitamin D (CALCIUM 600+D PO) Take 1 tablet by mouth daily.     . chlorthalidone (HYGROTON) 25 MG tablet Take 25 mg by mouth daily. 1 pill by mouth every 3 days    . clobetasol ointment (TEMOVATE) 0.05 % Apply 1 application topically 2 (two) times daily. To affected area 30 g 1  . clorazepate (TRANXENE) 7.5 MG tablet Take 1 tablet (7.5 mg total) by mouth daily as needed. 30 tablet 0  . cyanocobalamin (,VITAMIN B-12,) 1000 MCG/ML injection Inject 1,000 mcg into the muscle every 30 (thirty) days.     . Ibuprofen 200 MG CAPS Take 400 mg by mouth 2 (two) times daily as needed.     . irbesartan (AVAPRO) 300 MG tablet Take 1 tablet (300 mg total) by mouth daily. 90 tablet 3  . triamcinolone (NASACORT) 55 MCG/ACT AERO nasal inhaler Place 2 sprays into the nose daily. 1 Inhaler 12   No current facility-administered medications on file prior to visit.     Review of Systems  Constitutional: Negative for activity change, appetite change, fatigue, fever and unexpected weight change.  HENT: Negative for congestion, ear pain, rhinorrhea, sinus pressure and sore throat.        Has a dry mouth  Was sent to ENT- did testing (? Auto immune)  Told protein levels were high but other labs nl   Eyes: Negative for pain, redness and visual disturbance.  Respiratory: Negative for cough, shortness of breath and wheezing.   Cardiovascular: Negative for chest pain and palpitations.  Gastrointestinal: Negative for abdominal pain, blood in stool, constipation and diarrhea.  Endocrine: Negative for polydipsia and polyuria.  Genitourinary: Negative for dysuria, frequency and urgency.  Musculoskeletal: Positive for arthralgias, back pain and gait problem. Negative for joint swelling, myalgias and neck pain.  Skin: Negative for pallor  and rash.  Allergic/Immunologic: Negative for environmental allergies.  Neurological: Negative for dizziness, syncope and headaches.  Hematological: Negative for adenopathy. Does  not bruise/bleed easily.  Psychiatric/Behavioral: Negative for decreased concentration and dysphoric mood. The patient is not nervous/anxious.        Objective:   Physical Exam Constitutional:      General: She is not in acute distress.    Appearance: Normal appearance. She is well-developed. She is obese. She is not ill-appearing.  HENT:     Head: Normocephalic and atraumatic.  Eyes:     General: No scleral icterus.    Conjunctiva/sclera: Conjunctivae normal.     Pupils: Pupils are equal, round, and reactive to light.  Cardiovascular:     Rate and Rhythm: Normal rate and regular rhythm.  Pulmonary:     Effort: Pulmonary effort is normal.     Breath sounds: Normal breath sounds. No wheezing or rales.  Abdominal:     General: Bowel sounds are normal. There is no distension.     Palpations: Abdomen is soft.     Tenderness: There is no abdominal tenderness.  Musculoskeletal:        General: Tenderness present.     Cervical back: Normal range of motion and neck supple.     Lumbar back: Spasms and tenderness present. No swelling, edema, deformity or bony tenderness. Decreased range of motion. Negative right straight leg raise test and negative left straight leg raise test.     Right hip: No deformity, bony tenderness or crepitus. Decreased range of motion.     Comments: R hip - limited int/ext rotation due to pain in groin  Can flex hip to about 90 degrees   Lumbar musculature is tender /tight bilaterally  No bony tenderness  Some loss of lordosis   Lymphadenopathy:     Cervical: No cervical adenopathy.  Skin:    General: Skin is warm and dry.     Coloration: Skin is not pale.     Findings: No erythema or rash.     Comments: Some hyperpigmentation of skin over lumbar area due to heating pad use  No burns noted  Neurological:     Mental Status: She is alert.     Cranial Nerves: No cranial nerve deficit.     Sensory: No sensory deficit.     Motor: No atrophy or abnormal  muscle tone.     Coordination: Coordination normal.     Deep Tendon Reflexes: Reflexes are normal and symmetric.     Comments: Negative SLR  Psychiatric:        Mood and Affect: Mood normal.           Assessment & Plan:   Problem List Items Addressed This Visit      Digestive   Dry mouth    Pt mentions this was noted by her dentist  Was sent to ENT Dr Andee Poles  Did some ? Autoimmune testing All neg but "protein" level was high   Unsure if significant-will sent for note and report         Musculoskeletal and Integument   Osteoarthritis of right hip - Primary    Causing R sided groin pain - worse with walking  Recently had to start using a cane  Interarticular injection with ortho (Dr Dion Saucier) helped for 3 weeks-now back to square one Was told she cannot have another injection until June  Altered gait from hip pain is now causing pain in other areas (back) Since ibuprofen  helps-we will inc her omeprazole to bid for more GI protection (prn)  Also told to takensaid  with food (she takes bid)  Sent for last orthopedic note to review  Reviewed last xray showing mild oa R hip joint inst pt to call the ortho office to discuss the next step in tx       Relevant Medications   methocarbamol (ROBAXIN) 500 MG tablet     Other   B12 deficiency    B12 shot given today       Low back pain    Having bilat low back pain and spasm (presumably due to the altered gait due to R hip OA) Given px for methocarbamol for spasm to try when not driving (caution of sedation) Continue ice/heat with care        Relevant Medications   methocarbamol (ROBAXIN) 500 MG tablet

## 2020-03-15 NOTE — Telephone Encounter (Signed)
PA for Robaxin was done at [email or web address], I will await a response

## 2020-03-15 NOTE — Assessment & Plan Note (Signed)
Pt mentions this was noted by her dentist  Was sent to ENT Dr Pryor Ochoa  Did some ? Autoimmune testing All neg but "protein" level was high   Unsure if significant-will sent for note and report

## 2020-03-15 NOTE — Assessment & Plan Note (Signed)
B12 shot given today

## 2020-03-15 NOTE — Assessment & Plan Note (Signed)
Having bilat low back pain and spasm (presumably due to the altered gait due to R hip OA) Given px for methocarbamol for spasm to try when not driving (caution of sedation) Continue ice/heat with care

## 2020-03-15 NOTE — Patient Instructions (Addendum)
Call Dr Luanna Cole office to let them know how bad your symptoms are (and you are worried about making it to [place])    Try methocarbamol (muscle relaxer) for back muscle pain  Caution of sedation/falls with this   Try increasing the omeprazole to twice daily to see if you can then tolerate ibuprofen better   Use heat and ice carefully

## 2020-03-15 NOTE — Telephone Encounter (Signed)
Received an instant approval from Start Date:02/14/2020 Coverage End Date:03/15/2021; Pharmacy notified

## 2020-03-18 ENCOUNTER — Telehealth: Payer: Self-pay

## 2020-03-18 NOTE — Telephone Encounter (Signed)
-----   Message from Rise Mu, PA-C sent at 03/16/2020  3:53 PM EDT ----- Echo showed normal pump function, normal wall motion, slightly stiffened heart, trivially leaky aortic valve with mild thickening without narrowing, mildly dilated ascending aorta  Recommendations: Leaky aortic valve is stable -Optimal BP control, this was well controlled at her last visit -We can follow up her mildly dilated ascending aorta with imaging in 12 months

## 2020-03-18 NOTE — Telephone Encounter (Signed)
Call to patient to review echo results.    Pt verbalized understanding and has no further questions at this time.    Advised pt to call for any further questions or concerns.  No further orders.  Follow up next march 2022.

## 2020-03-20 ENCOUNTER — Ambulatory Visit: Payer: Medicare Other

## 2020-03-20 ENCOUNTER — Telehealth: Payer: Self-pay | Admitting: Family Medicine

## 2020-03-20 DIAGNOSIS — M545 Low back pain: Secondary | ICD-10-CM | POA: Diagnosis not present

## 2020-03-20 DIAGNOSIS — M25551 Pain in right hip: Secondary | ICD-10-CM | POA: Diagnosis not present

## 2020-03-20 NOTE — Telephone Encounter (Signed)
Spoke to pt's husband per DPR. She was at the orthopedic office right now. I advised him what Dr Glori Bickers said. He will pass the message on she will call if needed.

## 2020-03-20 NOTE — Telephone Encounter (Signed)
Please let pt know I reviewed her ENT notes and it showed mildly CRP (C reactive protein)  This is a non specific sign of inflammation in the body - I suspect in her case from her hip arthritis

## 2020-04-17 DIAGNOSIS — M25551 Pain in right hip: Secondary | ICD-10-CM | POA: Diagnosis not present

## 2020-04-24 ENCOUNTER — Telehealth: Payer: Self-pay | Admitting: Family Medicine

## 2020-04-24 DIAGNOSIS — I10 Essential (primary) hypertension: Secondary | ICD-10-CM

## 2020-04-24 DIAGNOSIS — E7800 Pure hypercholesterolemia, unspecified: Secondary | ICD-10-CM

## 2020-04-24 DIAGNOSIS — E538 Deficiency of other specified B group vitamins: Secondary | ICD-10-CM

## 2020-04-24 NOTE — Telephone Encounter (Signed)
-----   Message from Cloyd Stagers, RT sent at 04/11/2020  1:40 PM EDT ----- Regarding: Lab Orders for Thursday 6.3.2021 Please place lab orders for Thursday 6.3.2021, office visit for physical on Tuesday 6.8.2021 Thank you, Dyke Maes RT(R)

## 2020-04-25 ENCOUNTER — Other Ambulatory Visit (INDEPENDENT_AMBULATORY_CARE_PROVIDER_SITE_OTHER): Payer: Medicare Other

## 2020-04-25 ENCOUNTER — Other Ambulatory Visit: Payer: Self-pay

## 2020-04-25 DIAGNOSIS — I10 Essential (primary) hypertension: Secondary | ICD-10-CM

## 2020-04-25 DIAGNOSIS — E538 Deficiency of other specified B group vitamins: Secondary | ICD-10-CM

## 2020-04-25 DIAGNOSIS — E7800 Pure hypercholesterolemia, unspecified: Secondary | ICD-10-CM | POA: Diagnosis not present

## 2020-04-25 LAB — LIPID PANEL
Cholesterol: 201 mg/dL — ABNORMAL HIGH (ref 0–200)
HDL: 66.8 mg/dL (ref >39.00)
LDL Cholesterol: 107 mg/dL — ABNORMAL HIGH (ref 0–99)
NonHDL: 133.89
Total CHOL/HDL Ratio: 3
Triglycerides: 132 mg/dL (ref 0.0–149.0)
VLDL: 26.4 mg/dL (ref 0.0–40.0)

## 2020-04-25 LAB — CBC WITH DIFFERENTIAL/PLATELET
Basophils Absolute: 0.1 10*3/uL (ref 0.0–0.1)
Basophils Relative: 0.7 % (ref 0.0–3.0)
Eosinophils Absolute: 0.2 10*3/uL (ref 0.0–0.7)
Eosinophils Relative: 2.5 % (ref 0.0–5.0)
HCT: 36.3 % (ref 36.0–46.0)
Hemoglobin: 12.1 g/dL (ref 12.0–15.0)
Lymphocytes Relative: 25.4 % (ref 12.0–46.0)
Lymphs Abs: 1.8 10*3/uL (ref 0.7–4.0)
MCHC: 33.4 g/dL (ref 30.0–36.0)
MCV: 92.3 fl (ref 78.0–100.0)
Monocytes Absolute: 0.7 10*3/uL (ref 0.1–1.0)
Monocytes Relative: 9.7 % (ref 3.0–12.0)
Neutro Abs: 4.4 10*3/uL (ref 1.4–7.7)
Neutrophils Relative %: 61.7 % (ref 43.0–77.0)
Platelets: 374 10*3/uL (ref 150.0–400.0)
RBC: 3.93 Mil/uL (ref 3.87–5.11)
RDW: 15.1 % (ref 11.5–15.5)
WBC: 7.1 10*3/uL (ref 4.0–10.5)

## 2020-04-25 LAB — COMPREHENSIVE METABOLIC PANEL
ALT: 10 U/L (ref 0–35)
AST: 13 U/L (ref 0–37)
Albumin: 3.9 g/dL (ref 3.5–5.2)
Alkaline Phosphatase: 61 U/L (ref 39–117)
BUN: 18 mg/dL (ref 6–23)
CO2: 25 mEq/L (ref 19–32)
Calcium: 9.5 mg/dL (ref 8.4–10.5)
Chloride: 104 mEq/L (ref 96–112)
Creatinine, Ser: 0.81 mg/dL (ref 0.40–1.20)
GFR: 67.51 mL/min (ref >60.00)
Glucose, Bld: 88 mg/dL (ref 70–99)
Potassium: 4 mEq/L (ref 3.5–5.1)
Sodium: 138 mEq/L (ref 135–145)
Total Bilirubin: 0.4 mg/dL (ref 0.2–1.2)
Total Protein: 6.6 g/dL (ref 6.0–8.3)

## 2020-04-25 LAB — VITAMIN B12: Vitamin B-12: 318 pg/mL (ref 211–911)

## 2020-04-25 LAB — TSH: TSH: 1.33 u[IU]/mL (ref 0.35–4.50)

## 2020-04-26 ENCOUNTER — Ambulatory Visit (INDEPENDENT_AMBULATORY_CARE_PROVIDER_SITE_OTHER): Payer: Medicare Other

## 2020-04-26 VITALS — Ht 64.5 in | Wt 195.0 lb

## 2020-04-26 DIAGNOSIS — Z0000 Encounter for general adult medical examination without abnormal findings: Secondary | ICD-10-CM | POA: Diagnosis not present

## 2020-04-26 NOTE — Progress Notes (Signed)
Subjective:   Tammy Manning is a 85 y.o. female who presents for Medicare Annual (Subsequent) preventive examination.  Review of Systems: N/A   I connected with the patient today by telephone and verified that I am speaking with the correct person using two identifiers. Location patient: home Location nurse: work Persons participating in the virtual visit: patient, Marine scientist.   I discussed the limitations, risks, security and privacy concerns of performing an evaluation and management service by telephone and the availability of in person appointments. I also discussed with the patient that there may be a patient responsible charge related to this service. The patient expressed understanding and verbally consented to this telephonic visit.    Interactive audio and video telecommunications were attempted between this nurse and patient, however failed, due to patient having technical difficulties OR patient did not have access to video capability.  We continued and completed visit with audio only.     Cardiac Risk Factors include: advanced age (>60mn, >>58women);hypertension;dyslipidemia     Objective:     Vitals: Ht 5' 4.5" (1.638 m)   Wt 195 lb (88.5 kg)   BMI 32.95 kg/m   Body mass index is 32.95 kg/m.  Advanced Directives 04/26/2020 04/25/2019 05/09/2018 04/21/2018 04/16/2017 10/21/2016 09/15/2016  Does Patient Have a Medical Advance Directive? Yes Yes Yes Yes Yes Yes No  Type of [place] will Living will Living will Living will Living will [place] will -  Does patient want to make changes to medical advance directive? - - No - Patient declined - - - -  Copy of H[place] Chart? No - copy requested - - - - - -  Would patient like information on creating a medical advance directive? - No - Patient declined - - - - No - patient declined information    Tobacco Social History   Tobacco Use    Smoking Status Former Smoker  . Packs/day: 1.00  . Years: 12.00  . Pack years: 12.00  . Types: Cigarettes  . Quit date: 11/24/1963  . Years since quitting: 561.4 Smokeless Tobacco Never Used     Counseling given: Not Answered   Clinical Intake:  Pre-visit preparation completed: Yes  Pain : 0-10 Pain Score: 5  Pain Type: Chronic pain Pain Location: Back Pain Descriptors / Indicators: Aching Pain Onset: More than a month ago Pain Frequency: Intermittent     Nutritional Risks: None Diabetes: No  How often do you need to have someone help you when you read instructions, pamphlets, or other written materials from your doctor or pharmacy?: 1 - Never What is the last grade level you completed in school?: college graduate  Interpreter Needed?: No  Information entered by :: CJohnson, LPN  Past Medical History:  Diagnosis Date  . Anginal pain ([place]   . Arthritis    Knees  . B12 deficiency    Mild  . Bucket-handle tear of lateral meniscus of left knee as current injury 12/30/2012  . Cancer (HCC)    Skin, basal cell on nose and eyelid  . DJD (degenerative joint disease)    Low back  . Dyspepsia   . GERD (gastroesophageal reflux disease)   . Heart murmur   . Heart palpitations   . HOH (hard of hearing)   . Hyperlipidemia   . Hypertension   . Lactose intolerance   . Mixed incontinence   . PONV (postoperative nausea and vomiting)   .  Vulvar irritation    from urine pad, uses Triamcinolone oint.   Past Surgical History:  Procedure Laterality Date  . ABDOMINAL AORTOGRAM N/A 03/15/2017   Procedure: Abdominal Aortogram;  Surgeon: Wellington Hampshire, MD;  Location: [facility];  Service: Cardiovascular;  Laterality: N/A;  . ABDOMINAL HYSTERECTOMY  1982   Large fibroids and bleeding  . APPENDECTOMY    . BILATERAL SALPINGOOPHORECTOMY  1986  . BONE GRAFT HIP ILIAC CREST     To Left arm  . BREAST REDUCTION SURGERY    . BREAST SURGERY    . CATARACT EXTRACTION  W/PHACO Left 07/07/2016   Procedure: CATARACT EXTRACTION PHACO AND INTRAOCULAR LENS PLACEMENT (IOC);  Surgeon: Birder Robson, MD;  Location: [facility];  Service: Ophthalmology;  Laterality: Left;  [place] 01:10 AP% 18.3 CDE 12.91 Fluid pak lot # [id number]  . CATARACT EXTRACTION W/PHACO Right 07/28/2016   Procedure: CATARACT EXTRACTION PHACO AND INTRAOCULAR LENS PLACEMENT ([place]);  Surgeon: Birder Robson, MD;  Location: [facility];  Service: Ophthalmology;  Laterality: Right;  [place] 01:26 AP% 18.5 CDE 16.12 Fluid pack lot # [id number]  . COLONOSCOPY  07/2016   Dr. Thana Farr  . DILATION AND CURETTAGE OF UTERUS     miscarragesx2  . ESOPHAGOGASTRODUODENOSCOPY     Polyps  . [place]  2006  . KNEE ARTHROSCOPY WITH LATERAL MENISECTOMY Left 12/30/2012   Procedure: KNEE ARTHROSCOPY WITH LATERAL MENISECTOMY;  Surgeon: Johnny Bridge, MD;  Location: [facility];  Service: Orthopedics;  Laterality: Left;  LEFT KNEE SCOPE LATERAL MENISCECTOMY  . LEFT HEART CATH AND CORONARY ANGIOGRAPHY N/A 03/15/2017   Procedure: Left Heart Cath and Coronary Angiography;  Surgeon: Wellington Hampshire, MD;  Location: [facility];  Service: Cardiovascular;  Laterality: N/A;  . MOUTH SURGERY    . skin cancer eyelid  2012  . TONSILLECTOMY     Family History  Problem Relation Age of Onset  . Cancer Mother        Colon  . Heart disease Father        CAD  . Diabetes Sister   . Hypothyroidism Sister   . Diabetes Brother   . Leukemia Brother   . Cancer Paternal Aunt        Breast  . Diabetes Sister    Social History   Socioeconomic History  . Marital status: Married    Spouse name: Not on file  . Number of children: 2  . Years of education: Not on file  . Highest education level: Not on file  Occupational History  . Occupation: Architect: RETIRED  Tobacco Use  . Smoking status: Former Smoker    Packs/day: 1.00    Years: 12.00    Pack years: 12.00    Types: Cigarettes      Quit date: 11/24/1963    Years since quitting: 56.4  . Smokeless tobacco: Never Used  Substance and Sexual Activity  . Alcohol use: No    Alcohol/week: 0.0 standard drinks    Comment: Rare  . Drug use: No  . Sexual activity: Yes  Other Topics Concern  . Not on file  Social History Narrative   Metallurgist.     Social Determinants of Health   Financial Resource Strain: Low Risk   . Difficulty of Paying Living Expenses: Not hard at all  Food Insecurity: No Food Insecurity  . Worried About Charity fundraiser in the Last Year: Never true  . Ran  Out of Food in the Last Year: Never true  Transportation Needs: No Transportation Needs  . Lack of Transportation (Medical): No  . Lack of Transportation (Non-Medical): No  Physical Activity: Sufficiently Active  . Days of Exercise per Week: 7 days  . Minutes of Exercise per Session: 120 min  Stress: No Stress Concern Present  . Feeling of Stress : Not at all  Social Connections:   . Frequency of Communication with Friends and Family:   . Frequency of Social Gatherings with Friends and Family:   . Attends Religious Services:   . Active Member of Clubs or Organizations:   . Attends Archivist Meetings:   [name] Kitchen Marital Status:     Outpatient Encounter Medications as of 04/26/2020  Medication Sig  . acetaminophen (TYLENOL) 500 MG tablet Take 500 mg by mouth every 6 (six) hours as needed.  [name] Kitchen aspirin EC 81 MG tablet Take 1 tablet (81 mg total) by mouth daily.  [name] Kitchen atorvastatin (LIPITOR) 10 MG tablet TAKE 1 TABLET BY MOUTH EVERY DAY AT 6PM  . Calcium Carbonate-Vitamin D (CALCIUM 600+D PO) Take 1 tablet by mouth daily.   . chlorthalidone (HYGROTON) 25 MG tablet Take 25 mg by mouth daily. 1 pill by mouth every 3 days  . clobetasol ointment (TEMOVATE) 2.19 % Apply 1 application topically 2 (two) times daily. To affected area  . clorazepate (TRANXENE) 7.5 MG tablet Take 1 tablet (7.5 mg total) by mouth daily as needed.  . cyanocobalamin  (,VITAMIN B-12,) 1000 MCG/ML injection Inject 1,000 mcg into the muscle every 30 (thirty) days.   . Ibuprofen 200 MG CAPS Take 400 mg by mouth 2 (two) times daily as needed.   . irbesartan (AVAPRO) 300 MG tablet Take 1 tablet (300 mg total) by mouth daily.  . methocarbamol (ROBAXIN) 500 MG tablet Take 1 tablet (500 mg total) by mouth 3 (three) times daily as needed for muscle spasms (back pain). Caution of sedation  . omeprazole (PRILOSEC) 20 MG capsule TAKE 1 CAPSULE(20 MG) BY MOUTH BID PRN  . triamcinolone (NASACORT) 55 MCG/ACT AERO nasal inhaler Place 2 sprays into the nose daily.   No facility-administered encounter medications on file as of 04/26/2020.    Activities of Daily Living In your present state of health, do you have any difficulty performing the following activities: 04/26/2020  Hearing? Y  Comment wears hearing aids  Vision? N  Difficulty concentrating or making decisions? N  Walking or climbing stairs? N  Dressing or bathing? N  Doing errands, shopping? N  Preparing Food and eating ? N  Using the Toilet? N  In the past six months, have you accidently leaked urine? N  Do you have problems with loss of bowel control? N  Managing your Medications? N  Managing your Finances? N  Housekeeping or managing your Housekeeping? N  Some recent data might be hidden    Patient Care Team: [name], [name], MD as PCP - General Wellington Hampshire, MD as PCP - Cardiology (Cardiology) Susa Day, MD (Orthopedic Surgery) Richmond Campbell, MD (Gastroenterology) Wellington Hampshire, MD as Consulting Physician (Cardiology) Birder Robson, MD as Referring Physician (Ophthalmology)    Assessment:   This is a routine wellness examination for Chase.  Exercise Activities and Dietary recommendations Current Exercise Habits: Home exercise routine, Type of exercise: walking, Time (Minutes): > 60, Frequency (Times/Week): 7, Weekly Exercise (Minutes/Week): 0, Intensity: Moderate, Exercise  limited by: None identified  Goals    . Patient Stated  Starting 04/25/2019, I will continue to walk 2-3 miles daily.     . Patient Stated     04/26/2020, I will continue to walk everyday for about 2 hours.        Fall Risk Fall Risk  04/26/2020 04/25/2019 04/21/2018 04/16/2017 10/21/2016  Falls in the past year? 0 0 No No No  Number falls in past yr: 0 - - - -  Injury with Fall? 0 - - - -  Risk for fall due to : Medication side effect - - - -  Follow up Falls evaluation completed;Falls prevention discussed - - - -   Is the patient's home free of loose throw rugs in walkways, pet beds, electrical cords, etc?   yes      Grab bars in the bathroom? yes      Handrails on the stairs?   yes      Adequate lighting?   yes  Timed Get Up and Go performed: N/A  Depression Screen PHQ 2/9 Scores 04/26/2020 04/25/2019 04/21/2018 04/16/2017  PHQ - 2 Score 0 0 0 0  PHQ- 9 Score 0 0 0 -  Exception Documentation - - - -     Cognitive Function MMSE - Mini Mental State Exam 04/26/2020 04/25/2019 04/21/2018 04/16/2017 04/14/2016  Orientation to time 5 5 5 5 5   Orientation to Place 5 5 5 5 5   Registration 3 3 3 3 3   Attention/ Calculation 5 0 0 0 0  Recall 3 2 3 3 3   Recall-comments - unable to recall 1 of 3 words - - -  Language- name 2 objects - 0 0 0 0  Language- repeat 1 1 1 1 1   Language- follow 3 step command - 0 3 3 3   Language- read & follow direction - 0 0 0 0  Write a sentence - 0 0 0 0  Copy design - 0 0 0 0  Total score - 16 20 20 20   Mini Cog  Mini-Cog screen was completed. Maximum score is 22. A value of 0 denotes this part of the MMSE was not completed or the patient failed this part of the Mini-Cog screening.       Immunization History  Administered Date(s) Administered  . Influenza Split 07/24/2011, 08/23/2014  . Influenza Whole 08/23/2004, 07/24/2009, 08/06/2010, 08/05/2012  . Influenza, High Dose Seasonal PF 08/11/2016, 08/27/2017, 08/02/2018  . Influenza,inj,Quad PF,6+ Mos  08/16/2013, 08/07/2015  . PFIZER SARS-COV-2 Vaccination 12/15/2019, 01/05/2020  . Pneumococcal Conjugate-13 08/24/2014  . Pneumococcal Polysaccharide-23 08/20/2008  . Td 03/20/2003, 02/28/2013  . Zoster 09/04/2015    Qualifies for Shingles Vaccine: yes   Screening Tests Health Maintenance  Topic Date Due  . MAMMOGRAM  08/26/2019  . INFLUENZA VACCINE  06/23/2020  . TETANUS/TDAP  03/01/2023  . DEXA SCAN  Completed  . COVID-19 Vaccine  Completed  . PNA vac Low Risk Adult  Completed    Cancer Screenings: Lung: Low Dose CT Chest recommended if Age 38-80 years, 30 pack-year currently smoking OR have quit w/in 15 years. Patient does not qualify. Breast:  Up to date on Mammogram: No, due   Bone Density/Dexa: completed 10/12/2011 Colorectal: completed 05/09/2018  Additional Screenings:  Hepatitis C Screening: N/A     Plan:    Patient will continue to walk everyday for about 2 hours.    I have personally reviewed and noted the following in the patient's chart:   . Medical and social history . Use of alcohol, tobacco or illicit drugs  .  Current medications and supplements . Functional ability and status . Nutritional status . Physical activity . Advanced directives . List of other physicians . Hospitalizations, surgeries, and ER visits in previous 12 months . Vitals . Screenings to include cognitive, depression, and falls . Referrals and appointments  In addition, I have reviewed and discussed with patient certain preventive protocols, quality metrics, and best practice recommendations. A written personalized care plan for preventive services as well as general preventive health recommendations were provided to patient.     Andrez Grime, LPN  02/23/8886

## 2020-04-26 NOTE — Patient Instructions (Signed)
Tammy Manning , Thank you for taking time to come for your Medicare Wellness Visit. I appreciate your ongoing commitment to your health goals. Please review the following plan we discussed and let me know if I can assist you in the future.   Screening recommendations/referrals: Colonoscopy: Up to date, completed 05/09/2018 Mammogram: due Bone Density: completed 10/12/2011 Recommended yearly ophthalmology/optometry visit for glaucoma screening and checkup Recommended yearly dental visit for hygiene and checkup  Vaccinations: Influenza vaccine: Up to date, completed 08/10/2019 Pneumococcal vaccine: Completed series Tdap vaccine: Up to date, completed 02/28/2013 Shingles vaccine: discussed    Advanced directives: Please bring a copy of your POA (Power of [place]) and/or Living Will to your next appointment.   Conditions/risks identified: hypertension, hyperlipidemia  Next appointment: 04/30/2020 @ 9:30 am    Preventive Care 65 Years and Older, Female Preventive care refers to lifestyle choices and visits with your health care provider that can promote health and wellness. What does preventive care include?  A yearly physical exam. This is also called an annual well check.  Dental exams once or twice a year.  Routine eye exams. Ask your health care provider how often you should have your eyes checked.  Personal lifestyle choices, including:  Daily care of your teeth and gums.  Regular physical activity.  Eating a healthy diet.  Avoiding tobacco and drug use.  Limiting alcohol use.  Practicing safe sex.  Taking low-dose aspirin every day.  Taking vitamin and mineral supplements as recommended by your health care provider. What happens during an annual well check? The services and screenings done by your health care provider during your annual well check will depend on your age, overall health, lifestyle risk factors, and family history of disease. Counseling  Your health care  provider may ask you questions about your:  Alcohol use.  Tobacco use.  Drug use.  Emotional well-being.  Home and relationship well-being.  Sexual activity.  Eating habits.  History of falls.  Memory and ability to understand (cognition).  Work and work Statistician.  Reproductive health. Screening  You may have the following tests or measurements:  Height, weight, and BMI.  Blood pressure.  Lipid and cholesterol levels. These may be checked every 5 years, or more frequently if you are over 41 years old.  Skin check.  Lung cancer screening. You may have this screening every year starting at age 68 if you have a 30-pack-year history of smoking and currently smoke or have quit within the past 15 years.  Fecal occult blood test (FOBT) of the stool. You may have this test every year starting at age 6.  Flexible sigmoidoscopy or colonoscopy. You may have a sigmoidoscopy every 5 years or a colonoscopy every 10 years starting at age 16.  Hepatitis C blood test.  Hepatitis B blood test.  Sexually transmitted disease (STD) testing.  Diabetes screening. This is done by checking your blood sugar (glucose) after you have not eaten for a while (fasting). You may have this done every 1-3 years.  Bone density scan. This is done to screen for osteoporosis. You may have this done starting at age 21.  Mammogram. This may be done every 1-2 years. Talk to your health care provider about how often you should have regular mammograms. Talk with your health care provider about your test results, treatment options, and if necessary, the need for more tests. Vaccines  Your health care provider may recommend certain vaccines, such as:  Influenza vaccine. This is recommended every  year.  Tetanus, diphtheria, and acellular pertussis (Tdap, Td) vaccine. You may need a Td booster every 10 years.  Zoster vaccine. You may need this after age 59.  Pneumococcal 13-valent conjugate (PCV13)  vaccine. One dose is recommended after age 74.  Pneumococcal polysaccharide (PPSV23) vaccine. One dose is recommended after age 56. Talk to your health care provider about which screenings and vaccines you need and how often you need them. This information is not intended to replace advice given to you by your health care provider. Make sure you discuss any questions you have with your health care provider. Document Released: 12/06/2015 Document Revised: 07/29/2016 Document Reviewed: 09/10/2015 Elsevier Interactive Patient Education  2017 [employer] Prevention in the Home Falls can cause injuries. They can happen to people of all ages. There are many things you can do to make your home safe and to help prevent falls. What can I do on the outside of my home?  Regularly fix the edges of walkways and driveways and fix any cracks.  Remove anything that might make you trip as you walk through a door, such as a raised step or threshold.  Trim any bushes or trees on the path to your home.  Use bright outdoor lighting.  Clear any walking paths of anything that might make someone trip, such as rocks or tools.  Regularly check to see if handrails are loose or broken. Make sure that both sides of any steps have handrails.  Any raised decks and porches should have guardrails on the edges.  Have any leaves, snow, or ice cleared regularly.  Use sand or salt on walking paths during winter.  Clean up any spills in your garage right away. This includes oil or grease spills. What can I do in the bathroom?  Use night lights.  Install grab bars by the toilet and in the tub and shower. Do not use towel bars as grab bars.  Use non-skid mats or decals in the tub or shower.  If you need to sit down in the shower, use a plastic, non-slip stool.  Keep the floor dry. Clean up any water that spills on the floor as soon as it happens.  Remove soap buildup in the tub or shower  regularly.  Attach bath mats securely with double-sided non-slip rug tape.  Do not have throw rugs and other things on the floor that can make you trip. What can I do in the bedroom?  Use night lights.  Make sure that you have a light by your bed that is easy to reach.  Do not use any sheets or blankets that are too big for your bed. They should not hang down onto the floor.  Have a firm chair that has side arms. You can use this for support while you get dressed.  Do not have throw rugs and other things on the floor that can make you trip. What can I do in the kitchen?  Clean up any spills right away.  Avoid walking on wet floors.  Keep items that you use a lot in easy-to-reach places.  If you need to reach something above you, use a strong step stool that has a grab bar.  Keep electrical cords out of the way.  Do not use floor polish or wax that makes floors slippery. If you must use wax, use non-skid floor wax.  Do not have throw rugs and other things on the floor that can make you trip. What can  I do with my stairs?  Do not leave any items on the stairs.  Make sure that there are handrails on both sides of the stairs and use them. Fix handrails that are broken or loose. Make sure that handrails are as long as the stairways.  Check any carpeting to make sure that it is firmly attached to the stairs. Fix any carpet that is loose or worn.  Avoid having throw rugs at the top or bottom of the stairs. If you do have throw rugs, attach them to the floor with carpet tape.  Make sure that you have a light switch at the top of the stairs and the bottom of the stairs. If you do not have them, ask someone to add them for you. What else can I do to help prevent falls?  Wear shoes that:  Do not have high heels.  Have rubber bottoms.  Are comfortable and fit you well.  Are closed at the toe. Do not wear sandals.  If you use a stepladder:  Make sure that it is fully  opened. Do not climb a closed stepladder.  Make sure that both sides of the stepladder are locked into place.  Ask someone to hold it for you, if possible.  Clearly mark and make sure that you can see:  Any grab bars or handrails.  First and last steps.  Where the edge of each step is.  Use tools that help you move around (mobility aids) if they are needed. These include:  Canes.  Walkers.  Scooters.  Crutches.  Turn on the lights when you go into a dark area. Replace any light bulbs as soon as they burn out.  Set up your furniture so you have a clear path. Avoid moving your furniture around.  If any of your floors are uneven, fix them.  If there are any pets around you, be aware of where they are.  Review your medicines with your doctor. Some medicines can make you feel dizzy. This can increase your chance of falling. Ask your doctor what other things that you can do to help prevent falls. This information is not intended to replace advice given to you by your health care provider. Make sure you discuss any questions you have with your health care provider. Document Released: 09/05/2009 Document Revised: 04/16/2016 Document Reviewed: 12/14/2014 Elsevier Interactive Patient Education  2017 [employer].

## 2020-04-26 NOTE — Progress Notes (Signed)
PCP notes:  Health Maintenance: Mammogram- due   Abnormal Screenings: none   Patient concerns: none   Nurse concerns: none   Next PCP appt.: 04/30/2020 @ 9:30 am

## 2020-04-30 ENCOUNTER — Encounter: Payer: Self-pay | Admitting: Family Medicine

## 2020-04-30 ENCOUNTER — Other Ambulatory Visit: Payer: Self-pay

## 2020-04-30 ENCOUNTER — Ambulatory Visit (INDEPENDENT_AMBULATORY_CARE_PROVIDER_SITE_OTHER): Payer: Medicare Other | Admitting: Family Medicine

## 2020-04-30 VITALS — BP 138/78 | HR 76 | Temp 97.3°F | Ht 64.0 in | Wt 191.6 lb

## 2020-04-30 DIAGNOSIS — M79604 Pain in right leg: Secondary | ICD-10-CM

## 2020-04-30 DIAGNOSIS — M545 Low back pain, unspecified: Secondary | ICD-10-CM

## 2020-04-30 DIAGNOSIS — I10 Essential (primary) hypertension: Secondary | ICD-10-CM

## 2020-04-30 DIAGNOSIS — E7800 Pure hypercholesterolemia, unspecified: Secondary | ICD-10-CM | POA: Diagnosis not present

## 2020-04-30 DIAGNOSIS — E2839 Other primary ovarian failure: Secondary | ICD-10-CM | POA: Diagnosis not present

## 2020-04-30 DIAGNOSIS — E538 Deficiency of other specified B group vitamins: Secondary | ICD-10-CM

## 2020-04-30 DIAGNOSIS — M79605 Pain in left leg: Secondary | ICD-10-CM

## 2020-04-30 NOTE — Assessment & Plan Note (Signed)
Lab Results  Component Value Date   VITAMINB12 318 04/25/2020   Continue monthly shots

## 2020-04-30 NOTE — Assessment & Plan Note (Signed)
Continues orthopedic f/u at [facility]

## 2020-04-30 NOTE — Progress Notes (Signed)
Subjective:    Patient ID: Tammy Manning, female    DOB: 1935/06/19, 85 y.o.   MRN: [id number]  This visit occurred during the SARS-CoV-2 public health emergency.  Safety protocols were in place, including screening questions prior to the visit, additional usage of staff PPE, and extensive cleaning of exam room while observing appropriate contact time as indicated for disinfecting solutions.    HPI Pt presents for annual f/u of chronic medical problems   Had amw on 04/26/20 Noted mammogram was due No other gaps  Wt Readings from Last 3 Encounters:  04/30/20 191 lb 9 oz (86.9 kg)  04/26/20 195 lb (88.5 kg)  03/15/20 195 lb 6 oz (88.6 kg)  walking dogs for exercise  32.88 kg/m  C/o being in pain everywhere   Seeing ortho-back and hip issues   Has noted diffuse leg pain at times / both legs and hips (this is new)  Mammogram 10/19-has it scheduled for wednesday  Self breast exam -no lumps   dexa 11/12-in the normal range- open to another one  [employer]- taking ca with D  Exercise -walking   Colon screening = colonoscopy 11/19 - 5 y recall?  Mother had colon cancer  Sees Dr Earlean Shawl   covid status -immunized   HTN bp is stable today  No cp or palpitations or headaches or edema  No side effects to medicines  BP Readings from Last 3 Encounters:  04/30/20 138/78  03/15/20 (!) 144/90  02/02/20 124/62    Lab Results  Component Value Date   NA 138 04/25/2020   K 4.0 04/25/2020   CO2 25 04/25/2020   GLUCOSE 88 04/25/2020   BUN 18 04/25/2020   CREATININE 0.81 04/25/2020   CALCIUM 9.5 04/25/2020   GFRNONAA 61 02/02/2020   GFRAA 71 02/02/2020   Lab Results  Component Value Date   ALT 10 04/25/2020   AST 13 04/25/2020   ALKPHOS 61 04/25/2020   BILITOT 0.4 04/25/2020   Lab Results  Component Value Date   WBC 7.1 04/25/2020   HGB 12.1 04/25/2020   HCT 36.3 04/25/2020   MCV 92.3 04/25/2020   PLT 374.0 04/25/2020   Lab Results    Component Value Date   TSH 1.33 04/25/2020    Hyperlipidemia Lab Results  Component Value Date   CHOL 201 (H) 04/25/2020   CHOL 196 02/02/2020   CHOL 195 04/27/2019   Lab Results  Component Value Date   HDL 66.80 04/25/2020   HDL 80 02/02/2020   HDL 64.70 04/27/2019   Lab Results  Component Value Date   LDLCALC 107 (H) 04/25/2020   LDLCALC 93 02/02/2020   LDLCALC 107 (H) 04/27/2019   Lab Results  Component Value Date   TRIG 132.0 04/25/2020   TRIG 136 02/02/2020   TRIG 118.0 04/27/2019   Lab Results  Component Value Date   CHOLHDL 3 04/25/2020   CHOLHDL 2.5 02/02/2020   CHOLHDL 3 04/27/2019   Lab Results  Component Value Date   LDLDIRECT 106 (H) 02/02/2020   LDLDIRECT 105.9 02/08/2013   LDLDIRECT 125.0 09/12/2012   Atorvastatin  Eats healthy   B12 def Lab Results  Component Value Date   VITAMINB12 318 04/25/2020   Monthly B12 shots   Patient Active Problem List   Diagnosis Date Noted  . Estrogen deficiency 04/30/2020  . Low back pain 03/15/2020  . Dry mouth 03/15/2020  . Osteoarthritis of right hip 01/02/2020  . Groin pain, right 01/01/2020  . Trochanteric  bursitis of right hip 11/09/2018  . Transient global amnesia 05/12/2018  . Right thyroid nodule 05/12/2018  . TIA (transient ischemic attack) 05/09/2018  . Aortic insufficiency 09/10/2015  . Stress reaction 09/08/2015  . Encounter for Medicare annual wellness exam 02/28/2013  . Bucket-handle tear of lateral meniscus of left knee as current injury 12/30/2012  . Episodic atrial fibrillation ([place]) 11/17/2012  . Special screening for malignant neoplasms, colon 01/21/2012  . Hearing loss of both ears 01/14/2012  . Adverse drug reaction 03/12/2011  . HEADACHE, CHRONIC 09/24/2010  . Leg pain 08/04/2010  . B12 deficiency 03/17/2010  . DYSPEPSIA 03/17/2010  . POSTMENOPAUSAL STATUS 08/20/2008  . Essential hypertension 07/19/2007  . Hyperlipidemia 06/24/2007  . CONSTIPATION 06/24/2007  . IBS  06/24/2007  . DERMATITIS, ATOPIC 06/24/2007  . SKIN CANCER, HX OF 06/24/2007   Past Medical History:  Diagnosis Date  . Anginal pain ([place])   . Arthritis    Knees  . B12 deficiency    Mild  . Bucket-handle tear of lateral meniscus of left knee as current injury 12/30/2012  . Cancer ([facility])    Skin, basal cell on nose and eyelid  . DJD (degenerative joint disease)    Low back  . Dyspepsia   . GERD (gastroesophageal reflux disease)   . Heart murmur   . Heart palpitations   . HOH (hard of hearing)   . Hyperlipidemia   . Hypertension   . Lactose intolerance   . Mixed incontinence   . PONV (postoperative nausea and vomiting)   . Vulvar irritation    from urine pad, uses Triamcinolone oint.   Past Surgical History:  Procedure Laterality Date  . ABDOMINAL AORTOGRAM N/A 03/15/2017   Procedure: Abdominal Aortogram;  Surgeon: Wellington Hampshire, MD;  Location: [facility];  Service: Cardiovascular;  Laterality: N/A;  . ABDOMINAL HYSTERECTOMY  1982   Large fibroids and bleeding  . APPENDECTOMY    . BILATERAL SALPINGOOPHORECTOMY  1986  . BONE GRAFT HIP ILIAC CREST     To Left arm  . BREAST REDUCTION SURGERY    . BREAST SURGERY    . CATARACT EXTRACTION W/PHACO Left 07/07/2016   Procedure: CATARACT EXTRACTION PHACO AND INTRAOCULAR LENS PLACEMENT (IOC);  Surgeon: Birder Robson, MD;  Location: [facility];  Service: Ophthalmology;  Laterality: Left;  [place] 01:10 AP% 18.3 CDE 12.91 Fluid pak lot # [id number]  . CATARACT EXTRACTION W/PHACO Right 07/28/2016   Procedure: CATARACT EXTRACTION PHACO AND INTRAOCULAR LENS PLACEMENT ([place]);  Surgeon: Birder Robson, MD;  Location: [facility];  Service: Ophthalmology;  Laterality: Right;  [place] 01:26 AP% 18.5 CDE 16.12 Fluid pack lot # [id number]  . COLONOSCOPY  07/2016   Dr. Thana Farr  . DILATION AND CURETTAGE OF UTERUS     miscarragesx2  . ESOPHAGOGASTRODUODENOSCOPY     Polyps  . [place]  2006  . KNEE ARTHROSCOPY WITH LATERAL MENISECTOMY  Left 12/30/2012   Procedure: KNEE ARTHROSCOPY WITH LATERAL MENISECTOMY;  Surgeon: Johnny Bridge, MD;  Location: [facility];  Service: Orthopedics;  Laterality: Left;  LEFT KNEE SCOPE LATERAL MENISCECTOMY  . LEFT HEART CATH AND CORONARY ANGIOGRAPHY N/A 03/15/2017   Procedure: Left Heart Cath and Coronary Angiography;  Surgeon: Wellington Hampshire, MD;  Location: [facility];  Service: Cardiovascular;  Laterality: N/A;  . MOUTH SURGERY    . skin cancer eyelid  2012  . TONSILLECTOMY     Social History   Tobacco Use  . Smoking status: Former Smoker  Packs/day: 1.00    Years: 12.00    Pack years: 12.00    Types: Cigarettes    Quit date: 11/24/1963    Years since quitting: 56.4  . Smokeless tobacco: Never Used  Substance Use Topics  . Alcohol use: No    Alcohol/week: 0.0 standard drinks    Comment: Rare  . Drug use: No   Family History  Problem Relation Age of Onset  . Cancer Mother        Colon  . Heart disease Father        CAD  . Diabetes Sister   . Hypothyroidism Sister   . Diabetes Brother   . Leukemia Brother   . Cancer Paternal Aunt        Breast  . Diabetes Sister    Allergies  Allergen Reactions  . Amlodipine Besylate Other (See Comments)    Reaction:  Increases BP  . Antihistamines, Chlorpheniramine-Type Other (See Comments)    Reaction:  Makes pt hyper   . Atenolol Hypertension  . Cefuroxime Axetil Other (See Comments)    Upset stomach  . Chlorthalidone Other (See Comments)  . Ciprofloxacin Other (See Comments)    Upset stomach  . Co Q 10 [Coenzyme Q10] Other (See Comments)    Reaction:  GI upset   . Crestor [Rosuvastatin Calcium] Other (See Comments)    Reaction:  Myalgias   . Dextromethorphan-Guaifenesin Other (See Comments)    Reaction:  Made pt jittery   . Epinephrine Hives  . Other     Reaction:  Makes pt hyper   . Ramipril Other (See Comments)    Upset stomach  . Sulfamethoxazole-Trimethoprim Other (See Comments)    Upset  stomach  . Vitamin D Analogs Other (See Comments)    Reaction:  GI upset    Current Outpatient Medications on File Prior to Visit  Medication Sig Dispense Refill  . acetaminophen (TYLENOL) 500 MG tablet Take 500 mg by mouth every 6 (six) hours as needed.    [name] Kitchen aspirin EC 81 MG tablet Take 1 tablet (81 mg total) by mouth daily. 30 tablet 2  . atorvastatin (LIPITOR) 10 MG tablet TAKE 1 TABLET BY MOUTH EVERY DAY AT 6PM 90 tablet 3  . Calcium Carbonate-Vitamin D (CALCIUM 600+D PO) Take 1 tablet by mouth daily.     . chlorthalidone (HYGROTON) 25 MG tablet Take 25 mg by mouth daily. 1 pill by mouth every 3 days    . clobetasol ointment (TEMOVATE) 1.61 % Apply 1 application topically 2 (two) times daily. To affected area 30 g 1  . clorazepate (TRANXENE) 7.5 MG tablet Take 1 tablet (7.5 mg total) by mouth daily as needed. 30 tablet 0  . cyanocobalamin (,VITAMIN B-12,) 1000 MCG/ML injection Inject 1,000 mcg into the muscle every 30 (thirty) days.     . Ibuprofen 200 MG CAPS Take 400 mg by mouth 2 (two) times daily as needed.     . irbesartan (AVAPRO) 300 MG tablet Take 1 tablet (300 mg total) by mouth daily. 90 tablet 3  . methocarbamol (ROBAXIN) 500 MG tablet Take 1 tablet (500 mg total) by mouth 3 (three) times daily as needed for muscle spasms (back pain). Caution of sedation 30 tablet 1  . omeprazole (PRILOSEC) 20 MG capsule TAKE 1 CAPSULE(20 MG) BY MOUTH BID PRN 180 capsule 1  . triamcinolone (NASACORT) 55 MCG/ACT AERO nasal inhaler Place 2 sprays into the nose daily. 1 Inhaler 12   No current facility-administered medications on file  prior to visit.       Review of Systems  Constitutional: Negative for activity change, appetite change, fatigue, fever and unexpected weight change.  HENT: Negative for congestion, ear pain, rhinorrhea, sinus pressure and sore throat.   Eyes: Negative for pain, redness and visual disturbance.  Respiratory: Negative for cough, shortness of breath and wheezing.     Cardiovascular: Negative for chest pain and palpitations.  Gastrointestinal: Negative for abdominal pain, blood in stool, constipation and diarrhea.  Endocrine: Negative for polydipsia and polyuria.  Genitourinary: Negative for dysuria, frequency and urgency.  Musculoskeletal: Positive for arthralgias, back pain and myalgias.  Skin: Negative for pallor and rash.  Allergic/Immunologic: Negative for environmental allergies.  Neurological: Negative for dizziness, syncope and headaches.  Hematological: Negative for adenopathy. Does not bruise/bleed easily.  Psychiatric/Behavioral: Negative for decreased concentration and dysphoric mood. The patient is not nervous/anxious.        Objective:   Physical Exam Constitutional:      General: She is not in acute distress.    Appearance: Normal appearance. She is well-developed. She is obese. She is not ill-appearing or diaphoretic.  HENT:     Head: Normocephalic and atraumatic.     Right Ear: Tympanic membrane, ear canal and external ear normal.     Left Ear: Tympanic membrane, ear canal and external ear normal.     Nose: Nose normal. No congestion.     Mouth/Throat:     Mouth: Mucous membranes are moist.     Pharynx: Oropharynx is clear. No posterior oropharyngeal erythema.  Eyes:     General: No scleral icterus.    Extraocular Movements: Extraocular movements intact.     Conjunctiva/sclera: Conjunctivae normal.     Pupils: Pupils are equal, round, and reactive to light.  Neck:     Thyroid: No thyromegaly.     Vascular: No carotid bruit or JVD.  Cardiovascular:     Rate and Rhythm: Normal rate and regular rhythm.     Pulses: Normal pulses.     Heart sounds: Normal heart sounds. No gallop.   Pulmonary:     Effort: Pulmonary effort is normal. No respiratory distress.     Breath sounds: Normal breath sounds. No wheezing.     Comments: Good air exch Chest:     Chest wall: No tenderness.  Abdominal:     General: Bowel sounds are  normal. There is no distension or abdominal bruit.     Palpations: Abdomen is soft. There is no mass.     Tenderness: There is no abdominal tenderness.     Hernia: No hernia is present.  Genitourinary:    Comments: Breast exam: No mass, nodules, thickening, tenderness, bulging, retraction, inflamation, nipple discharge or skin changes noted.  No axillary or clavicular LA.     Musculoskeletal:        General: No tenderness. Normal range of motion.     Cervical back: Normal range of motion and neck supple. No rigidity. No muscular tenderness.     Right lower leg: No edema.     Left lower leg: No edema.     Comments: No tenderness  Lymphadenopathy:     Cervical: No cervical adenopathy.  Skin:    General: Skin is warm and dry.     Coloration: Skin is not pale.     Findings: No erythema or rash.     Comments: Solar lentigines diffusely sks as well  Neurological:     Mental Status: She is alert. Mental  status is at baseline.     Cranial Nerves: No cranial nerve deficit.     Motor: No abnormal muscle tone.     Coordination: Coordination normal.     Gait: Gait normal.     Deep Tendon Reflexes: Reflexes are normal and symmetric. Reflexes normal.  Psychiatric:        Mood and Affect: Mood normal.        Cognition and Memory: Cognition and memory normal.           Assessment & Plan:   Problem List Items Addressed This Visit      Cardiovascular and Mediastinum   Essential hypertension - Primary    bp in fair control at this time  BP Readings from Last 1 Encounters:  04/30/20 138/78   No changes needed Most recent labs reviewed  Disc lifstyle change with low sodium diet and exercise  Watching this closely as pt does take ibuprofen        Other   B12 deficiency    Lab Results  Component Value Date   VITAMINB12 318 04/25/2020   Continue monthly shots      Hyperlipidemia    Disc goals for lipids and reasons to control them Rev last labs with pt Rev low sat fat diet  in detail Atorvastatin and diet   (may need to hold it for myalgias however) Stable  LDL 107-pref under 100 if poss        Leg pain    New leg pain-bilateral/ comes and goes More diffuse and in the muscles  Disc poss of statin side eff  Will hold atovastatin for at least a week to see if any change and update [place]  If this is a problem- maybe consider intermittent dosing She had myalgias from crestor in the past       Low back pain    Continues orthopedic f/u at [facility]      Estrogen deficiency    Screen dexa ordered No falls or fx      Relevant Orders   DG Bone Density

## 2020-04-30 NOTE — Assessment & Plan Note (Signed)
New leg pain-bilateral/ comes and goes More diffuse and in the muscles  Disc poss of statin side eff  Will hold atovastatin for at least a week to see if any change and update [place]  If this is a problem- maybe consider intermittent dosing She had myalgias from crestor in the past

## 2020-04-30 NOTE — Assessment & Plan Note (Signed)
bp in fair control at this time  BP Readings from Last 1 Encounters:  04/30/20 138/78   No changes needed Most recent labs reviewed  Disc lifstyle change with low sodium diet and exercise  Watching this closely as pt does take ibuprofen

## 2020-04-30 NOTE — Assessment & Plan Note (Signed)
Disc goals for lipids and reasons to control them Rev last labs with pt Rev low sat fat diet in detail Atorvastatin and diet   (may need to hold it for myalgias however) Stable  LDL 107-pref under 100 if poss

## 2020-04-30 NOTE — Assessment & Plan Note (Signed)
Screen dexa ordered No falls or fx

## 2020-04-30 NOTE — Patient Instructions (Addendum)
Hold your atorvastatin for a week and see if your leg pain gets better  Let us know   Call and schedule your dexa   Take care of yourself  Stay active

## 2020-05-11 DIAGNOSIS — M545 Low back pain: Secondary | ICD-10-CM | POA: Diagnosis not present

## 2020-05-13 ENCOUNTER — Other Ambulatory Visit: Payer: Self-pay | Admitting: Family Medicine

## 2020-05-21 ENCOUNTER — Encounter: Payer: Self-pay | Admitting: Family Medicine

## 2020-05-21 MED ORDER — CLORAZEPATE DIPOTASSIUM 7.5 MG PO TABS: 7.5000 mg | ORAL_TABLET | Freq: Every day | ORAL | 0 refills | Status: DC | PRN

## 2020-05-29 ENCOUNTER — Ambulatory Visit (INDEPENDENT_AMBULATORY_CARE_PROVIDER_SITE_OTHER)
Admission: RE | Admit: 2020-05-29 | Discharge: 2020-05-29 | Disposition: A | Discharge status: Home / Self Care | Payer: Medicare Other | Source: Ambulatory Visit | Attending: Family Medicine | Admitting: Family Medicine

## 2020-05-29 ENCOUNTER — Other Ambulatory Visit: Payer: Self-pay

## 2020-05-29 DIAGNOSIS — E2839 Other primary ovarian failure: Secondary | ICD-10-CM

## 2020-06-02 ENCOUNTER — Encounter: Payer: Self-pay | Admitting: Emergency Medicine

## 2020-06-02 ENCOUNTER — Emergency Department: Payer: Medicare Other

## 2020-06-02 ENCOUNTER — Other Ambulatory Visit: Payer: Self-pay

## 2020-06-02 ENCOUNTER — Emergency Department
Admission: EM | Admit: 2020-06-02 | Discharge: 2020-06-02 | Disposition: A | Discharge status: Home / Self Care | Payer: Medicare Other | Attending: Emergency Medicine | Admitting: Emergency Medicine

## 2020-06-02 DIAGNOSIS — M79672 Pain in left foot: Secondary | ICD-10-CM

## 2020-06-02 DIAGNOSIS — Z85828 Personal history of other malignant neoplasm of skin: Secondary | ICD-10-CM | POA: Insufficient documentation

## 2020-06-02 DIAGNOSIS — Y939 Activity, unspecified: Secondary | ICD-10-CM | POA: Insufficient documentation

## 2020-06-02 DIAGNOSIS — Y9234 Swimming pool (public) as the place of occurrence of the external cause: Secondary | ICD-10-CM | POA: Diagnosis not present

## 2020-06-02 DIAGNOSIS — M79605 Pain in left leg: Secondary | ICD-10-CM | POA: Diagnosis not present

## 2020-06-02 DIAGNOSIS — Z87891 Personal history of nicotine dependence: Secondary | ICD-10-CM | POA: Insufficient documentation

## 2020-06-02 DIAGNOSIS — X501XXA Overexertion from prolonged static or awkward postures, initial encounter: Secondary | ICD-10-CM | POA: Insufficient documentation

## 2020-06-02 DIAGNOSIS — I10 Essential (primary) hypertension: Secondary | ICD-10-CM | POA: Diagnosis not present

## 2020-06-02 DIAGNOSIS — Y999 Unspecified external cause status: Secondary | ICD-10-CM | POA: Insufficient documentation

## 2020-06-02 DIAGNOSIS — M7989 Other specified soft tissue disorders: Secondary | ICD-10-CM | POA: Diagnosis not present

## 2020-06-02 DIAGNOSIS — M25572 Pain in left ankle and joints of left foot: Secondary | ICD-10-CM | POA: Diagnosis present

## 2020-06-02 DIAGNOSIS — Z7982 Long term (current) use of aspirin: Secondary | ICD-10-CM | POA: Insufficient documentation

## 2020-06-02 NOTE — ED Provider Notes (Signed)
[facility] Emergency Department Provider Note  ____________________________________________   First MD Initiated Contact with Patient 06/02/20 1421     (approximate)  I have reviewed the triage vital signs and the nursing notes.   HISTORY  Chief Complaint Ankle Pain    HPI Tammy Manning is a 85 y.o. female with arthritis, hypertension, hyperlipidemia who comes in with left lower leg and ankle pain.  Patient noticed some swelling yesterday and was worried about a blood clot.  Patient states that the swelling seems to have come down now.  It started yesterday, nothing made it better, nothing made it worse, better on its own.  Reports little bit of pain in the lower mid calf on the lateral side.  Patient does report on Wednesday, 4 days ago she was getting into a pool and did twist her ankle and caught herself.  Never actually fell to the ground.  She still able to ambulate but these symptoms started a few days after this.          Past Medical History:  Diagnosis Date  . Anginal pain ([place])   . Arthritis    Knees  . B12 deficiency    Mild  . Bucket-handle tear of lateral meniscus of left knee as current injury 12/30/2012  . Cancer (HCC)    Skin, basal cell on nose and eyelid  . DJD (degenerative joint disease)    Low back  . Dyspepsia   . GERD (gastroesophageal reflux disease)   . Heart murmur   . Heart palpitations   . HOH (hard of hearing)   . Hyperlipidemia   . Hypertension   . Lactose intolerance   . Mixed incontinence   . PONV (postoperative nausea and vomiting)   . Vulvar irritation    from urine pad, uses Triamcinolone oint.    Patient Active Problem List   Diagnosis Date Noted  . Estrogen deficiency 04/30/2020  . Low back pain 03/15/2020  . Dry mouth 03/15/2020  . Osteoarthritis of right hip 01/02/2020  . Groin pain, right 01/01/2020  . Trochanteric bursitis of right hip 11/09/2018  . Transient global amnesia 05/12/2018  .  Right thyroid nodule 05/12/2018  . TIA (transient ischemic attack) 05/09/2018  . Aortic insufficiency 09/10/2015  . Stress reaction 09/08/2015  . Encounter for Medicare annual wellness exam 02/28/2013  . Bucket-handle tear of lateral meniscus of left knee as current injury 12/30/2012  . Episodic atrial fibrillation ([place]) 11/17/2012  . Special screening for malignant neoplasms, colon 01/21/2012  . Hearing loss of both ears 01/14/2012  . Adverse drug reaction 03/12/2011  . HEADACHE, CHRONIC 09/24/2010  . Leg pain 08/04/2010  . B12 deficiency 03/17/2010  . DYSPEPSIA 03/17/2010  . POSTMENOPAUSAL STATUS 08/20/2008  . Essential hypertension 07/19/2007  . Hyperlipidemia 06/24/2007  . CONSTIPATION 06/24/2007  . IBS 06/24/2007  . DERMATITIS, ATOPIC 06/24/2007  . SKIN CANCER, HX OF 06/24/2007    Past Surgical History:  Procedure Laterality Date  . ABDOMINAL AORTOGRAM N/A 03/15/2017   Procedure: Abdominal Aortogram;  Surgeon: Wellington Hampshire, MD;  Location: [place] [facility];  Service: Cardiovascular;  Laterality: N/A;  . ABDOMINAL HYSTERECTOMY  1982   Large fibroids and bleeding  . APPENDECTOMY    . BILATERAL SALPINGOOPHORECTOMY  1986  . BONE GRAFT HIP ILIAC CREST     To Left arm  . BREAST REDUCTION SURGERY    . BREAST SURGERY    . CATARACT EXTRACTION W/PHACO Left 07/07/2016   Procedure: CATARACT EXTRACTION  PHACO AND INTRAOCULAR LENS PLACEMENT (IOC);  Surgeon: Birder Robson, MD;  Location: [facility];  Service: Ophthalmology;  Laterality: Left;  [place] 01:10 AP% 18.3 CDE 12.91 Fluid pak lot # [id number]  . CATARACT EXTRACTION W/PHACO Right 07/28/2016   Procedure: CATARACT EXTRACTION PHACO AND INTRAOCULAR LENS PLACEMENT ([place]);  Surgeon: Birder Robson, MD;  Location: [facility];  Service: Ophthalmology;  Laterality: Right;  [place] 01:26 AP% 18.5 CDE 16.12 Fluid pack lot # [id number]  . COLONOSCOPY  07/2016   Dr. Thana Farr  . DILATION AND CURETTAGE OF UTERUS     miscarragesx2  .  ESOPHAGOGASTRODUODENOSCOPY     Polyps  . [place]  2006  . KNEE ARTHROSCOPY WITH LATERAL MENISECTOMY Left 12/30/2012   Procedure: KNEE ARTHROSCOPY WITH LATERAL MENISECTOMY;  Surgeon: Johnny Bridge, MD;  Location: [place];  Service: Orthopedics;  Laterality: Left;  LEFT KNEE SCOPE LATERAL MENISCECTOMY  . LEFT HEART CATH AND CORONARY ANGIOGRAPHY N/A 03/15/2017   Procedure: Left Heart Cath and Coronary Angiography;  Surgeon: Wellington Hampshire, MD;  Location: [place] [facility];  Service: Cardiovascular;  Laterality: N/A;  . MOUTH SURGERY    . skin cancer eyelid  2012  . TONSILLECTOMY      Prior to Admission medications   Medication Sig Start Date End Date Taking? Authorizing Provider  acetaminophen (TYLENOL) 500 MG tablet Take 500 mg by mouth every 6 (six) hours as needed.    [provider]  aspirin EC 81 MG tablet Take 1 tablet (81 mg total) by mouth daily. 05/10/18   Gladstone Lighter, MD  atorvastatin (LIPITOR) 10 MG tablet TAKE 1 TABLET BY MOUTH EVERY DAY AT 6 PM 05/13/20   [name], [name], MD  Calcium Carbonate-Vitamin D (CALCIUM 600+D PO) Take 1 tablet by mouth daily.     [provider]  chlorthalidone (HYGROTON) 25 MG tablet Take 25 mg by mouth daily. 1 pill by mouth every 3 days    [provider]  clobetasol ointment (TEMOVATE) 2.77 % Apply 1 application topically 2 (two) times daily. To affected area 02/21/20   [name], [name], MD  clorazepate (TRANXENE) 7.5 MG tablet Take 1 tablet (7.5 mg total) by mouth daily as needed. 05/21/20   [name], [name], MD  cyanocobalamin (,VITAMIN B-12,) 1000 MCG/ML injection Inject 1,000 mcg into the muscle every 30 (thirty) days.  12/16/16   [provider]  Ibuprofen 200 MG CAPS Take 400 mg by mouth 2 (two) times daily as needed.     [provider]  irbesartan (AVAPRO) 300 MG tablet Take 1 tablet (300 mg total) by mouth daily. 02/02/20   Dunn, Areta Haber, PA-C  methocarbamol (ROBAXIN) 500  MG tablet Take 1 tablet (500 mg total) by mouth 3 (three) times daily as needed for muscle spasms (back pain). Caution of sedation 03/15/20   [name], [name], MD  omeprazole (PRILOSEC) 20 MG capsule TAKE 1 CAPSULE(20 MG) BY MOUTH BID PRN 03/15/20   [name], [name], MD  triamcinolone (NASACORT) 55 MCG/ACT AERO nasal inhaler Place 2 sprays into the nose daily. 02/22/19   [name], [name], MD    Allergies Amlodipine besylate; Antihistamines, chlorpheniramine-type; Atenolol; Cefuroxime axetil; Chlorthalidone; Ciprofloxacin; Co q 10 [coenzyme q10]; Crestor [rosuvastatin calcium]; Dextromethorphan-guaifenesin; Epinephrine; Other; Ramipril; Sulfamethoxazole-trimethoprim; and Vitamin d analogs  Family History  Problem Relation Age of Onset  . Cancer Mother        Colon  . Heart disease Father        CAD  .  Diabetes Sister   . Hypothyroidism Sister   . Diabetes Brother   . Leukemia Brother   . Cancer Paternal Aunt        Breast  . Diabetes Sister     Social History Social History   Tobacco Use  . Smoking status: Former Smoker    Packs/day: 1.00    Years: 12.00    Pack years: 12.00    Types: Cigarettes    Quit date: 11/24/1963    Years since quitting: 56.5  . Smokeless tobacco: Never Used  Vaping Use  . Vaping Use: Never used  Substance Use Topics  . Alcohol use: No    Alcohol/week: 0.0 standard drinks    Comment: Rare  . Drug use: No      Review of Systems Constitutional: No fever/chills Eyes: No visual changes. ENT: No sore throat. Cardiovascular: Denies chest pain. Respiratory: Denies shortness of breath. Gastrointestinal: No abdominal pain.  No nausea, no vomiting.  No diarrhea.  No constipation. Genitourinary: Negative for dysuria. Musculoskeletal: Leg pain Skin: Negative for rash. Neurological: Negative for headaches, focal weakness or numbness. All other ROS negative ____________________________________________   PHYSICAL EXAM:  VITAL SIGNS: ED Triage Vitals  Enc  Vitals Group     BP 06/02/20 1207 129/63     Pulse Rate 06/02/20 1207 83     Resp 06/02/20 1207 16     Temp 06/02/20 1207 98.2 F (36.8 C)     Temp Source 06/02/20 1207 Oral     SpO2 06/02/20 1207 95 %     Weight 06/02/20 1205 187 lb (84.8 kg)     Height 06/02/20 1205 5' 4"  (1.626 m)     Head Circumference --      Peak Flow --      Pain Score 06/02/20 1205 3     Pain Loc --      Pain Edu? --      Excl. in [place]? --     Constitutional: Alert and oriented. Well appearing and in no acute distress. Eyes: Conjunctivae are normal. EOMI. Head: Atraumatic. Nose: No congestion/rhinnorhea. Mouth/Throat: Mucous membranes are moist.   Neck: No stridor. Trachea Midline. FROM Cardiovascular: Normal rate, regular rhythm. Grossly normal heart sounds.  Good peripheral circulation. Respiratory: Normal respiratory effort.  No retractions. Lungs CTAB. Gastrointestinal: Soft and nontender. No distention. No abdominal bruits.  Musculoskeletal: Mild tenderness to the lateral side of the lower left leg without any obvious bruising or swelling noted.  2+ distal pulse.[name] Kitchen  No joint effusions.  Little bit tenderness on the bottom on the left foot Neurologic:  Normal speech and language. No gross focal neurologic deficits are appreciated.  Skin:  Skin is warm, dry and intact. No rash noted. Psychiatric: Mood and affect are normal. Speech and behavior are normal. GU: Deferred    RADIOLOGY  Official radiology report(s): US Venous Img Lower Unilateral Left  Result Date: 06/02/2020 CLINICAL DATA:  Pain and swelling EXAM: LEFT LOWER EXTREMITY VENOUS DOPPLER ULTRASOUND TECHNIQUE: Gray-scale sonography with compression, as well as color and duplex ultrasound, were performed to evaluate the deep venous system(s) from the level of the common femoral vein through the popliteal and proximal calf veins. COMPARISON:  09/03/2016 contralateral FINDINGS: VENOUS Normal compressibility of the common femoral, superficial  femoral, and popliteal veins, as well as the visualized calf veins. Visualized portions of profunda femoral vein and great saphenous vein unremarkable. No filling defects to suggest DVT on grayscale or color Doppler imaging. Doppler waveforms show normal direction  of venous flow, normal respiratory phasicity and response to augmentation. Limited views of the contralateral common femoral vein are unremarkable. OTHER None. Limitations: none IMPRESSION: No femoropopliteal DVT nor evidence of DVT within the visualized calf veins. If clinical symptoms are inconsistent or if there are persistent or worsening symptoms, further imaging (possibly involving the iliac veins) may be warranted. Electronically Signed   By: Lucrezia Europe M.D.   On: 06/02/2020 13:10    ____________________________________________   PROCEDURES  Procedure(s) performed (including Critical Care):  Procedures   ____________________________________________   INITIAL IMPRESSION / ASSESSMENT AND PLAN / ED COURSE  Maecy Podgurski was evaluated in Emergency Department on 06/02/2020 for the symptoms described in the history of present illness. She was evaluated in the context of the global COVID-19 pandemic, which necessitated consideration that the patient might be at risk for infection with the SARS-CoV-2 virus that causes COVID-19. Institutional protocols and algorithms that pertain to the evaluation of patients at risk for COVID-19 are in a state of rapid change based on information released by regulatory bodies including the CDC and federal and state organizations. These policies and algorithms were followed during the patient's care in the ED.    Patient is a well-appearing 85 year old who comes in ambulatory but with some concerns for left leg swelling and a little bit of discomfort.  Patient reports that she supposed to go on a car ride tomorrow and she wants to make sure there is no evidence of DVT.  DVT was ordered from triage and  was negative.  Was numbness of arterial injury given she is got 2+ distal pulse.  No evidence of cellulitis.  She is little bit of tenderness on the side of her leg but has been ambulatory.  Offered x-rays to evaluate for fracture but patient declined given she is been ambulatory and has low suspicion for this.  Suspect that more than likely she might have a very mild sprain secondary to the tripping incident a few days prior.  Encouraged her to continue to watch the area and if she develops return of swelling if with persistent symptoms she may need repeat ultrasound in 1 to 2 weeks.  Patient expressed understanding felt comfortable with this plan.   ____________________________________________   FINAL CLINICAL IMPRESSION(S) / ED DIAGNOSES   Final diagnoses:  Left foot pain      MEDICATIONS GIVEN DURING THIS VISIT:  Medications - No data to display   ED Discharge Orders    None       Note:  This document was prepared using Dragon voice recognition software and may include unintentional dictation errors.   Vanessa Glendora, MD 06/03/20 1256

## 2020-06-02 NOTE — ED Triage Notes (Signed)
Pt to ED via POV c/p left lower leg and ankle pain. Pt states that she noticed some swelling yesterday and last night noticed she was having pain. Pt is concerned for a possible blood clot. Pt is in NAD at this time.

## 2020-06-02 NOTE — ED Notes (Signed)
Repeat VS obtained by this RN. Pt visualized in NAD. Pt asking about wait times at this time.

## 2020-06-02 NOTE — Discharge Instructions (Addendum)
Ultrasound was negative for blood clot. Return to ER if you develop worsening pain, swelling or any other concerns.    No femoropopliteal DVT nor evidence of DVT within the visualized  calf veins.   If clinical symptoms are inconsistent or if there are persistent or  worsening symptoms, further imaging (possibly involving the iliac  veins) may be warranted.

## 2020-06-07 ENCOUNTER — Ambulatory Visit (INDEPENDENT_AMBULATORY_CARE_PROVIDER_SITE_OTHER): Payer: Medicare Other | Admitting: Podiatry

## 2020-06-07 ENCOUNTER — Ambulatory Visit (INDEPENDENT_AMBULATORY_CARE_PROVIDER_SITE_OTHER): Payer: Medicare Other

## 2020-06-07 ENCOUNTER — Other Ambulatory Visit: Payer: Self-pay

## 2020-06-07 DIAGNOSIS — M25472 Effusion, left ankle: Secondary | ICD-10-CM

## 2020-06-07 DIAGNOSIS — S93402A Sprain of unspecified ligament of left ankle, initial encounter: Secondary | ICD-10-CM

## 2020-06-07 NOTE — Progress Notes (Signed)
   HPI: 85 y.o. female presenting today for evaluation of an ankle sprain that the patient sustained on 06/01/2020.  Patient states that she stepped wrong wearing a pair of slippers while she was playing with the dogs.  The next morning she noticed bruising and swelling with severe pain to the area.  She went to the emergency department where x-rays were taken and negative for fracture.  She states that over the past week she has noticed significant improvement of her pain and her symptoms.  The bruising has subsided as well.  She presents for further treatment and evaluation  Past Medical History:  Diagnosis Date  . Anginal pain ([place])   . Arthritis    Knees  . B12 deficiency    Mild  . Bucket-handle tear of lateral meniscus of left knee as current injury 12/30/2012  . Cancer (HCC)    Skin, basal cell on nose and eyelid  . DJD (degenerative joint disease)    Low back  . Dyspepsia   . GERD (gastroesophageal reflux disease)   . Heart murmur   . Heart palpitations   . HOH (hard of hearing)   . Hyperlipidemia   . Hypertension   . Lactose intolerance   . Mixed incontinence   . PONV (postoperative nausea and vomiting)   . Vulvar irritation    from urine pad, uses Triamcinolone oint.     Physical Exam: General: The patient is alert and oriented x3 in no acute distress.  Dermatology: Skin is warm, dry and supple bilateral lower extremities. Negative for open lesions or macerations.  Vascular: Palpable pedal pulses bilaterally.  There is some minimal edema noted throughout the ankle joint medially. Capillary refill within normal limits.  Neurological: Epicritic and protective threshold grossly intact bilaterally.   Musculoskeletal Exam: Range of motion within normal limits to all pedal and ankle joints bilateral. Muscle strength 5/5 in all groups bilateral.  There is some moderate tenderness to palpation along the posterior tibial tendon tract left ankle.  Pain on palpation also noted to  the medial gutter of the ankle joint  Radiographic Exam:  Normal osseous mineralization. Joint spaces preserved. No fracture/dislocation/boney destruction.    Assessment: 1.  Ankle sprain left, initial encounter, improving over the past week   Plan of Care:  1. Patient evaluated. X-Rays reviewed.  2.  Compression anklet dispensed.  Wear daily x3 weeks 3.  Recommend OTC Motrin 600 mg 2 times daily x2 weeks 4.  Continue wearing good supportive shoes.  Patient states that she is getting much better and is able to wear comfortable shoes without pain. 5.  Return to clinic as needed      Edrick Kins, DPM [facility]  Dr. Edrick Kins, DPM    [address],  [address]                Office [phone]  Fax [phone]

## 2020-06-20 ENCOUNTER — Encounter: Payer: Self-pay | Admitting: Family Medicine

## 2020-06-20 DIAGNOSIS — Z1231 Encounter for screening mammogram for malignant neoplasm of breast: Secondary | ICD-10-CM | POA: Diagnosis not present

## 2020-06-20 DIAGNOSIS — M25551 Pain in right hip: Secondary | ICD-10-CM | POA: Diagnosis not present

## 2020-07-01 ENCOUNTER — Encounter: Payer: Self-pay | Admitting: Family Medicine

## 2020-07-31 ENCOUNTER — Encounter: Payer: Self-pay | Admitting: Family Medicine

## 2020-07-31 DIAGNOSIS — N6311 Unspecified lump in the right breast, upper outer quadrant: Secondary | ICD-10-CM | POA: Diagnosis not present

## 2020-07-31 DIAGNOSIS — R921 Mammographic calcification found on diagnostic imaging of breast: Secondary | ICD-10-CM | POA: Diagnosis not present

## 2020-08-05 DIAGNOSIS — H903 Sensorineural hearing loss, bilateral: Secondary | ICD-10-CM | POA: Diagnosis not present

## 2020-09-03 ENCOUNTER — Telehealth: Payer: Self-pay | Admitting: Family Medicine

## 2020-09-03 NOTE — Telephone Encounter (Signed)
Please schedule a nurse visit for that

## 2020-09-03 NOTE — Telephone Encounter (Signed)
Patient called in and wanted to schedule b-12 shot. Has not received in months. Please advise.

## 2020-09-03 NOTE — Telephone Encounter (Signed)
Patient scheduled appointment on 09/12/20.

## 2020-09-10 ENCOUNTER — Telehealth: Payer: Self-pay | Admitting: Family Medicine

## 2020-09-10 NOTE — Telephone Encounter (Signed)
Pt called wanting to see if dr tower recommend getting pfyser booster vaccine  They had vaccine in jan 2021

## 2020-09-10 NOTE — Telephone Encounter (Signed)
I do recommend it

## 2020-09-10 NOTE — Telephone Encounter (Signed)
Pt.notified

## 2020-09-12 ENCOUNTER — Ambulatory Visit: Payer: Medicare Other

## 2020-09-12 ENCOUNTER — Ambulatory Visit (INDEPENDENT_AMBULATORY_CARE_PROVIDER_SITE_OTHER): Payer: Medicare Other

## 2020-09-12 ENCOUNTER — Other Ambulatory Visit: Payer: Self-pay

## 2020-09-12 DIAGNOSIS — E538 Deficiency of other specified B group vitamins: Secondary | ICD-10-CM | POA: Diagnosis not present

## 2020-09-12 MED ORDER — CYANOCOBALAMIN 1000 MCG/ML IJ SOLN
1000.0000 ug | Freq: Once | INTRAMUSCULAR | Status: AC
  Administered 2020-09-12: 1000 ug via INTRAMUSCULAR

## 2020-09-12 NOTE — Progress Notes (Signed)
Per orders of Dr. Einar Pheasant, injection of vit M72 given by Brenton Grills. Patient tolerated injection well.

## 2020-09-25 DIAGNOSIS — M25551 Pain in right hip: Secondary | ICD-10-CM | POA: Diagnosis not present

## 2020-09-30 ENCOUNTER — Other Ambulatory Visit: Payer: Self-pay | Admitting: Family Medicine

## 2020-10-01 MED ORDER — CLORAZEPATE DIPOTASSIUM 7.5 MG PO TABS
7.5000 mg | ORAL_TABLET | Freq: Every day | ORAL | 0 refills | Status: DC | PRN
Start: 2020-10-01 — End: 2021-02-18

## 2020-10-09 ENCOUNTER — Encounter: Payer: Self-pay | Admitting: Family Medicine

## 2020-10-09 ENCOUNTER — Telehealth (INDEPENDENT_AMBULATORY_CARE_PROVIDER_SITE_OTHER): Payer: Medicare Other | Admitting: Family Medicine

## 2020-10-09 ENCOUNTER — Other Ambulatory Visit: Payer: Self-pay

## 2020-10-09 DIAGNOSIS — J019 Acute sinusitis, unspecified: Secondary | ICD-10-CM | POA: Insufficient documentation

## 2020-10-09 DIAGNOSIS — J0110 Acute frontal sinusitis, unspecified: Secondary | ICD-10-CM | POA: Diagnosis not present

## 2020-10-09 MED ORDER — AMOXICILLIN-POT CLAVULANATE 875-125 MG PO TABS: 1.0000 | ORAL_TABLET | Freq: Two times a day (BID) | ORAL | 0 refills | Status: DC

## 2020-10-09 MED ORDER — BENZONATATE 200 MG PO CAPS: 200.0000 mg | ORAL_CAPSULE | Freq: Three times a day (TID) | ORAL | 1 refills | Status: DC | PRN

## 2020-10-09 MED ORDER — HYDROCOD POLST-CPM POLST ER 10-8 MG/5ML PO SUER
5.0000 mL | Freq: Two times a day (BID) | ORAL | 0 refills | Status: DC | PRN
Start: 2020-10-09 — End: 2020-10-16

## 2020-10-09 NOTE — Assessment & Plan Note (Signed)
Worse in L frontal area  After viral uri (covid neg) for 10 d  Px augmentin bid for 7d Fluids/rest/saline  mucinex prn for congestion  Tessalon for cough  tussionex for cough with caution of sedation (pt aware and has tolerated this before)  Update if not starting to improve in a week or if worsening   Also if any new symptoms

## 2020-10-09 NOTE — Patient Instructions (Signed)
Take augmentin as directed for a sinus infection  Tessalon for cough  tussionex for cough with caution of sedation  Drink fluids and rest mucinex is ok to loosen phlegm Breathe steam and try nasal saline spray for congestion   Update if not starting to improve in a week or if worsening   Also if any new symptoms

## 2020-10-09 NOTE — Progress Notes (Signed)
Virtual Visit via Telephone Note  I connected with Tammy Manning on 10/09/20 at  9:00 AM EST by telephone and verified that I am speaking with the correct person using two identifiers.  Location: Patient: home Provider: office   I discussed the limitations, risks, security and privacy concerns of performing an evaluation and management service by telephone and the availability of in person appointments. I also discussed with the patient that there may be a patient responsible charge related to this service. The patient expressed understanding and agreed to proceed.  Parties involved in encounter  Patient: Tammy Manning  Provider:  Loura Pardon MD    History of Present Illness: Pt presents with sinus symptoms   Bad cough for about 10 days  Face is really hurting/ sinus pain /worse above L eye  Cough is deep and phlegm is green  No wheeze or sob  Lost voice- it is improved   Ears -clogged Throat- does not hurt any more   No appetite  No n/v/d No loss of taste or smell     Son has a cold and 16 y old grandchild  Not out in public a lot  Gave it to her husband   She did take a covid test - both negative   She checks her temp and it went up one point during the week  No real fever  Some aching  No chills   Nasal congestion / switches sides   otc mucinex (plain ) she cannot take DM Taking tylenol  occ nsaid this week- advil     Patient Active Problem List   Diagnosis Date Noted  . Acute sinusitis 10/09/2020  . Estrogen deficiency 04/30/2020  . Low back pain 03/15/2020  . Dry mouth 03/15/2020  . Osteoarthritis of right hip 01/02/2020  . Groin pain, right 01/01/2020  . Trochanteric bursitis of right hip 11/09/2018  . Transient global amnesia 05/12/2018  . Right thyroid nodule 05/12/2018  . TIA (transient ischemic attack) 05/09/2018  . Aortic insufficiency 09/10/2015  . Stress reaction 09/08/2015  . Encounter for Medicare annual wellness exam 02/28/2013  .  Bucket-handle tear of lateral meniscus of left knee as current injury 12/30/2012  . Episodic atrial fibrillation ([place]) 11/17/2012  . Special screening for malignant neoplasms, colon 01/21/2012  . Hearing loss of both ears 01/14/2012  . Adverse drug reaction 03/12/2011  . HEADACHE, CHRONIC 09/24/2010  . Leg pain 08/04/2010  . B12 deficiency 03/17/2010  . DYSPEPSIA 03/17/2010  . POSTMENOPAUSAL STATUS 08/20/2008  . Essential hypertension 07/19/2007  . Hyperlipidemia 06/24/2007  . CONSTIPATION 06/24/2007  . IBS 06/24/2007  . DERMATITIS, ATOPIC 06/24/2007  . SKIN CANCER, HX OF 06/24/2007   Past Medical History:  Diagnosis Date  . Anginal pain ([place])   . Arthritis    Knees  . B12 deficiency    Mild  . Bucket-handle tear of lateral meniscus of left knee as current injury 12/30/2012  . Cancer (HCC)    Skin, basal cell on nose and eyelid  . DJD (degenerative joint disease)    Low back  . Dyspepsia   . GERD (gastroesophageal reflux disease)   . Heart murmur   . Heart palpitations   . HOH (hard of hearing)   . Hyperlipidemia   . Hypertension   . Lactose intolerance   . Mixed incontinence   . PONV (postoperative nausea and vomiting)   . Vulvar irritation    from urine pad, uses Triamcinolone oint.   Past Surgical History:  Procedure  Laterality Date  . ABDOMINAL AORTOGRAM N/A 03/15/2017   Procedure: Abdominal Aortogram;  Surgeon: Wellington Hampshire, MD;  Location: [facility];  Service: Cardiovascular;  Laterality: N/A;  . ABDOMINAL HYSTERECTOMY  1982   Large fibroids and bleeding  . APPENDECTOMY    . BILATERAL SALPINGOOPHORECTOMY  1986  . BONE GRAFT HIP ILIAC CREST     To Left arm  . BREAST REDUCTION SURGERY    . BREAST SURGERY    . CATARACT EXTRACTION W/PHACO Left 07/07/2016   Procedure: CATARACT EXTRACTION PHACO AND INTRAOCULAR LENS PLACEMENT (IOC);  Surgeon: Birder Robson, MD;  Location: [facility];  Service: Ophthalmology;  Laterality: Left;  [place] 01:10 AP% 18.3 CDE  12.91 Fluid pak lot # [id number]  . CATARACT EXTRACTION W/PHACO Right 07/28/2016   Procedure: CATARACT EXTRACTION PHACO AND INTRAOCULAR LENS PLACEMENT ([place]);  Surgeon: Birder Robson, MD;  Location: [facility];  Service: Ophthalmology;  Laterality: Right;  [place] 01:26 AP% 18.5 CDE 16.12 Fluid pack lot # [id number]  . COLONOSCOPY  07/2016   Dr. Thana Farr  . DILATION AND CURETTAGE OF UTERUS     miscarragesx2  . ESOPHAGOGASTRODUODENOSCOPY     Polyps  . [place]  2006  . KNEE ARTHROSCOPY WITH LATERAL MENISECTOMY Left 12/30/2012   Procedure: KNEE ARTHROSCOPY WITH LATERAL MENISECTOMY;  Surgeon: Johnny Bridge, MD;  Location: [facility];  Service: Orthopedics;  Laterality: Left;  LEFT KNEE SCOPE LATERAL MENISCECTOMY  . LEFT HEART CATH AND CORONARY ANGIOGRAPHY N/A 03/15/2017   Procedure: Left Heart Cath and Coronary Angiography;  Surgeon: Wellington Hampshire, MD;  Location: [facility];  Service: Cardiovascular;  Laterality: N/A;  . MOUTH SURGERY    . skin cancer eyelid  2012  . TONSILLECTOMY     Social History   Tobacco Use  . Smoking status: Former Smoker    Packs/day: 1.00    Years: 12.00    Pack years: 12.00    Types: Cigarettes    Quit date: 11/24/1963    Years since quitting: 56.9  . Smokeless tobacco: Never Used  Vaping Use  . Vaping Use: Never used  Substance Use Topics  . Alcohol use: No    Alcohol/week: 0.0 standard drinks    Comment: Rare  . Drug use: No   Family History  Problem Relation Age of Onset  . Cancer Mother        Colon  . Heart disease Father        CAD  . Diabetes Sister   . Hypothyroidism Sister   . Diabetes Brother   . Leukemia Brother   . Cancer Paternal Aunt        Breast  . Diabetes Sister    Allergies  Allergen Reactions  . Amlodipine Besylate Other (See Comments)    Reaction:  Increases BP  . Antihistamines, Chlorpheniramine-Type Other (See Comments)    Reaction:  Makes pt hyper   . Atenolol Hypertension  . Cefuroxime  Axetil Other (See Comments)    Upset stomach  . Chlorthalidone Other (See Comments)  . Ciprofloxacin Other (See Comments)    Upset stomach  . Co Q 10 [Coenzyme Q10] Other (See Comments)    Reaction:  GI upset   . Crestor [Rosuvastatin Calcium] Other (See Comments)    Reaction:  Myalgias   . Dextromethorphan-Guaifenesin Other (See Comments)    Reaction:  Made pt jittery   . Epinephrine Hives  . Other     Reaction:  Makes pt hyper   .  Ramipril Other (See Comments)    Upset stomach  . Sulfamethoxazole-Trimethoprim Other (See Comments)    Upset stomach  . Vitamin D Analogs Other (See Comments)    Reaction:  GI upset    Current Outpatient Medications on File Prior to Visit  Medication Sig Dispense Refill  . acetaminophen (TYLENOL) 500 MG tablet Take 500 mg by mouth every 6 (six) hours as needed.    [name] Kitchen aspirin EC 81 MG tablet Take 1 tablet (81 mg total) by mouth daily. 30 tablet 2  . atorvastatin (LIPITOR) 10 MG tablet TAKE 1 TABLET BY MOUTH EVERY DAY AT 6 PM 90 tablet 3  . Calcium Carbonate-Vitamin D (CALCIUM 600+D PO) Take 1 tablet by mouth daily.     . chlorthalidone (HYGROTON) 25 MG tablet Take 25 mg by mouth daily. 1 pill by mouth every 3 days    . clobetasol ointment (TEMOVATE) 5.72 % Apply 1 application topically 2 (two) times daily. To affected area 30 g 1  . clorazepate (TRANXENE) 7.5 MG tablet Take 1 tablet (7.5 mg total) by mouth daily as needed. 30 tablet 0  . cyanocobalamin (,VITAMIN B-12,) 1000 MCG/ML injection Inject 1,000 mcg into the muscle every 30 (thirty) days.     . Ibuprofen 200 MG CAPS Take 400 mg by mouth 2 (two) times daily as needed.     . irbesartan (AVAPRO) 300 MG tablet Take 1 tablet (300 mg total) by mouth daily. 90 tablet 3  . methocarbamol (ROBAXIN) 500 MG tablet Take 1 tablet (500 mg total) by mouth 3 (three) times daily as needed for muscle spasms (back pain). Caution of sedation 30 tablet 1  . omeprazole (PRILOSEC) 20 MG capsule TAKE 1 CAPSULE(20 MG) BY  MOUTH BID PRN 180 capsule 1  . triamcinolone (NASACORT) 55 MCG/ACT AERO nasal inhaler Place 2 sprays into the nose daily. 1 Inhaler 12   No current facility-administered medications on file prior to visit.   Review of Systems  Constitutional: Positive for malaise/fatigue. Negative for chills and fever.  HENT: Positive for congestion and sinus pain. Negative for ear pain and sore throat.   Eyes: Negative for blurred vision, discharge and redness.  Respiratory: Positive for cough and sputum production. Negative for hemoptysis, shortness of breath, wheezing and stridor.   Cardiovascular: Negative for chest pain, palpitations and leg swelling.  Gastrointestinal: Negative for abdominal pain, diarrhea, nausea and vomiting.  Musculoskeletal: Negative for myalgias.  Skin: Negative for rash.  Neurological: Positive for headaches. Negative for dizziness.    Observations/Objective: Pt sounds mildly hoarse  Cough sounds productive  No audible wheeze or sob  Nl cognition-good historian Nl mood (just fatigued)    Assessment and Plan: Problem List Items Addressed This Visit      Respiratory   Acute sinusitis    Worse in L frontal area  After viral uri (covid neg) for 10 d  Px augmentin bid for 7d Fluids/rest/saline  mucinex prn for congestion  Tessalon for cough  tussionex for cough with caution of sedation (pt aware and has tolerated this before)  Update if not starting to improve in a week or if worsening   Also if any new symptoms       Relevant Medications   amoxicillin-clavulanate (AUGMENTIN) 875-125 MG tablet   benzonatate (TESSALON) 200 MG capsule   chlorpheniramine-HYDROcodone (TUSSIONEX PENNKINETIC ER) 10-8 MG/5ML SUER       Follow Up Instructions: Take augmentin as directed for a sinus infection  Tessalon for cough  tussionex for cough  with caution of sedation  Drink fluids and rest mucinex is ok to loosen phlegm Breathe steam and try nasal saline spray for congestion    Update if not starting to improve in a week or if worsening   Also if any new symptoms    I discussed the assessment and treatment plan with the patient. The patient was provided an opportunity to ask questions and all were answered. The patient agreed with the plan and demonstrated an understanding of the instructions.   The patient was advised to call back or seek an in-person evaluation if the symptoms worsen or if the condition fails to improve as anticipated.  I provided 14 minutes of non-face-to-face time during this encounter.   Loura Pardon, MD

## 2020-10-15 ENCOUNTER — Ambulatory Visit: Payer: Medicare Other | Admitting: Family Medicine

## 2020-10-15 ENCOUNTER — Telehealth: Payer: Self-pay

## 2020-10-15 NOTE — Telephone Encounter (Signed)
[place] Night - Client Nonclinical Telephone Record AccessNurse Client [place] Night - Client Client Site [place] Physician Loura Pardon - MD Contact Type Call Who Is Calling Patient / Member / Family / Caregiver Caller Name [place] Phone Number [phone] Patient Name Tammy Manning Patient DOB 07-20-1935 Call Type Message Only Information Provided Reason for Call Request for General Office Information Initial Comment Caller states she canceled appt. at 2pm today and was wants to know if she can keep it. Additional Comment hours provided Disp. Time Disposition Final User 10/15/2020 7:13:34 AM General Information Provided Yes Idolina Primer Call Closed By: Idolina Primer Transaction Date/Time: 10/15/2020 7:10:49 AM (ET)

## 2020-10-15 NOTE — Telephone Encounter (Signed)
Per appt notes pt has already rescheduled for 10/16/20 at 10:30 AM. FYI to [place].

## 2020-10-16 ENCOUNTER — Other Ambulatory Visit: Payer: Self-pay

## 2020-10-16 ENCOUNTER — Other Ambulatory Visit: Payer: Self-pay | Admitting: Family Medicine

## 2020-10-16 ENCOUNTER — Encounter: Payer: Self-pay | Admitting: Family Medicine

## 2020-10-16 ENCOUNTER — Ambulatory Visit (INDEPENDENT_AMBULATORY_CARE_PROVIDER_SITE_OTHER)
Admission: RE | Admit: 2020-10-16 | Discharge: 2020-10-16 | Disposition: A | Discharge status: Home / Self Care | Payer: Medicare Other | Source: Ambulatory Visit | Attending: Family Medicine | Admitting: Family Medicine

## 2020-10-16 ENCOUNTER — Ambulatory Visit (INDEPENDENT_AMBULATORY_CARE_PROVIDER_SITE_OTHER): Payer: Medicare Other | Admitting: Family Medicine

## 2020-10-16 VITALS — BP 142/68 | HR 73 | Temp 97.5°F | Ht 64.0 in | Wt 188.4 lb

## 2020-10-16 DIAGNOSIS — M2550 Pain in unspecified joint: Secondary | ICD-10-CM | POA: Insufficient documentation

## 2020-10-16 DIAGNOSIS — E538 Deficiency of other specified B group vitamins: Secondary | ICD-10-CM | POA: Diagnosis not present

## 2020-10-16 DIAGNOSIS — M25512 Pain in left shoulder: Secondary | ICD-10-CM | POA: Insufficient documentation

## 2020-10-16 DIAGNOSIS — M85411 Solitary bone cyst, right shoulder: Secondary | ICD-10-CM | POA: Diagnosis not present

## 2020-10-16 DIAGNOSIS — Z23 Encounter for immunization: Secondary | ICD-10-CM

## 2020-10-16 DIAGNOSIS — S4991XA Unspecified injury of right shoulder and upper arm, initial encounter: Secondary | ICD-10-CM | POA: Diagnosis not present

## 2020-10-16 DIAGNOSIS — M25511 Pain in right shoulder: Secondary | ICD-10-CM | POA: Diagnosis not present

## 2020-10-16 DIAGNOSIS — M2540 Effusion, unspecified joint: Secondary | ICD-10-CM | POA: Insufficient documentation

## 2020-10-16 LAB — CBC WITH DIFFERENTIAL/PLATELET
Basophils Absolute: 0.1 10*3/uL (ref 0.0–0.1)
Basophils Relative: 0.8 % (ref 0.0–3.0)
Eosinophils Absolute: 0.2 10*3/uL (ref 0.0–0.7)
Eosinophils Relative: 2.2 % (ref 0.0–5.0)
HCT: 38.2 % (ref 36.0–46.0)
Hemoglobin: 12.6 g/dL (ref 12.0–15.0)
Lymphocytes Relative: 25 % (ref 12.0–46.0)
Lymphs Abs: 2.1 10*3/uL (ref 0.7–4.0)
MCHC: 32.8 g/dL (ref 30.0–36.0)
MCV: 91.8 fl (ref 78.0–100.0)
Monocytes Absolute: 0.7 10*3/uL (ref 0.1–1.0)
Monocytes Relative: 8.7 % (ref 3.0–12.0)
Neutro Abs: 5.3 10*3/uL (ref 1.4–7.7)
Neutrophils Relative %: 63.3 % (ref 43.0–77.0)
Platelets: 363 10*3/uL (ref 150.0–400.0)
RBC: 4.16 Mil/uL (ref 3.87–5.11)
RDW: 14.1 % (ref 11.5–15.5)
WBC: 8.4 10*3/uL (ref 4.0–10.5)

## 2020-10-16 LAB — SEDIMENTATION RATE: Sed Rate: 28 mm/hr (ref 0–30)

## 2020-10-16 MED ORDER — CYANOCOBALAMIN 1000 MCG/ML IJ SOLN
1000.0000 ug | Freq: Once | INTRAMUSCULAR | Status: AC
  Administered 2020-10-16: 1000 ug via INTRAMUSCULAR

## 2020-10-16 MED ORDER — MELOXICAM 15 MG PO TABS: 15.0000 mg | ORAL_TABLET | Freq: Every day | ORAL | 1 refills | Status: DC | PRN

## 2020-10-16 NOTE — Progress Notes (Signed)
Subjective:    Patient ID: Tammy Manning, female    DOB: Sep 24, 1935, 85 y.o.   MRN: [id number]  This visit occurred during the SARS-CoV-2 public health emergency.  Safety protocols were in place, including screening questions prior to the visit, additional usage of staff PPE, and extensive cleaning of exam room while observing appropriate contact time as indicated for disinfecting solutions.    HPI Pt presents with R shoulder pain and some other joint pains  Wt Readings from Last 3 Encounters:  10/16/20 188 lb 7 oz (85.5 kg)  06/02/20 187 lb (84.8 kg)  04/30/20 191 lb 9 oz (86.9 kg)   32.35 kg/m  Hurt her R shoulder  Lifting a bunch of trash to throw into a dumpster  Hurt immediately  Used heat and ice  Just not going away  Cannot abduct arm or lift above head Hurts to lie on her sides   Taking ibuprofen -not that helpful   Never had trouble with this shoulder before   Needs flu shot today  Has booster covid on Friday   Needs B 12 shot today  Also has intermittent swollen joints in hands / stiff in ams  No redness Various aches and pains   Patient Active Problem List   Diagnosis Date Noted  . Right shoulder pain 10/16/2020  . Joint pain 10/16/2020  . Joint swelling 10/16/2020  . Acute sinusitis 10/09/2020  . Estrogen deficiency 04/30/2020  . Low back pain 03/15/2020  . Dry mouth 03/15/2020  . Osteoarthritis of right hip 01/02/2020  . Groin pain, right 01/01/2020  . Trochanteric bursitis of right hip 11/09/2018  . Transient global amnesia 05/12/2018  . Right thyroid nodule 05/12/2018  . TIA (transient ischemic attack) 05/09/2018  . Aortic insufficiency 09/10/2015  . Stress reaction 09/08/2015  . Encounter for Medicare annual wellness exam 02/28/2013  . Bucket-handle tear of lateral meniscus of left knee as current injury 12/30/2012  . Episodic atrial fibrillation ([place]) 11/17/2012  . Special screening for malignant neoplasms, colon 01/21/2012  . Hearing  loss of both ears 01/14/2012  . Adverse drug reaction 03/12/2011  . HEADACHE, CHRONIC 09/24/2010  . Leg pain 08/04/2010  . B12 deficiency 03/17/2010  . DYSPEPSIA 03/17/2010  . POSTMENOPAUSAL STATUS 08/20/2008  . Essential hypertension 07/19/2007  . Hyperlipidemia 06/24/2007  . CONSTIPATION 06/24/2007  . IBS 06/24/2007  . DERMATITIS, ATOPIC 06/24/2007  . SKIN CANCER, HX OF 06/24/2007   Past Medical History:  Diagnosis Date  . Anginal pain ([place])   . Arthritis    Knees  . B12 deficiency    Mild  . Bucket-handle tear of lateral meniscus of left knee as current injury 12/30/2012  . Cancer (HCC)    Skin, basal cell on nose and eyelid  . DJD (degenerative joint disease)    Low back  . Dyspepsia   . GERD (gastroesophageal reflux disease)   . Heart murmur   . Heart palpitations   . HOH (hard of hearing)   . Hyperlipidemia   . Hypertension   . Lactose intolerance   . Mixed incontinence   . PONV (postoperative nausea and vomiting)   . Vulvar irritation    from urine pad, uses Triamcinolone oint.   Past Surgical History:  Procedure Laterality Date  . ABDOMINAL AORTOGRAM N/A 03/15/2017   Procedure: Abdominal Aortogram;  Surgeon: Wellington Hampshire, MD;  Location: [facility];  Service: Cardiovascular;  Laterality: N/A;  . ABDOMINAL HYSTERECTOMY  1982   Large fibroids and bleeding  .  APPENDECTOMY    . BILATERAL SALPINGOOPHORECTOMY  1986  . BONE GRAFT HIP ILIAC CREST     To Left arm  . BREAST REDUCTION SURGERY    . BREAST SURGERY    . CATARACT EXTRACTION W/PHACO Left 07/07/2016   Procedure: CATARACT EXTRACTION PHACO AND INTRAOCULAR LENS PLACEMENT (IOC);  Surgeon: Birder Robson, MD;  Location: [facility];  Service: Ophthalmology;  Laterality: Left;  [place] 01:10 AP% 18.3 CDE 12.91 Fluid pak lot # [id number]  . CATARACT EXTRACTION W/PHACO Right 07/28/2016   Procedure: CATARACT EXTRACTION PHACO AND INTRAOCULAR LENS PLACEMENT ([place]);  Surgeon: Birder Robson, MD;  Location: [facility];  Service: Ophthalmology;  Laterality: Right;  [place] 01:26 AP% 18.5 CDE 16.12 Fluid pack lot # [id number]  . COLONOSCOPY  07/2016   Dr. Thana Farr  . DILATION AND CURETTAGE OF UTERUS     miscarragesx2  . ESOPHAGOGASTRODUODENOSCOPY     Polyps  . [place]  2006  . KNEE ARTHROSCOPY WITH LATERAL MENISECTOMY Left 12/30/2012   Procedure: KNEE ARTHROSCOPY WITH LATERAL MENISECTOMY;  Surgeon: Johnny Bridge, MD;  Location: [facility];  Service: Orthopedics;  Laterality: Left;  LEFT KNEE SCOPE LATERAL MENISCECTOMY  . LEFT HEART CATH AND CORONARY ANGIOGRAPHY N/A 03/15/2017   Procedure: Left Heart Cath and Coronary Angiography;  Surgeon: Wellington Hampshire, MD;  Location: [facility];  Service: Cardiovascular;  Laterality: N/A;  . MOUTH SURGERY    . skin cancer eyelid  2012  . TONSILLECTOMY     Social History   Tobacco Use  . Smoking status: Former Smoker    Packs/day: 1.00    Years: 12.00    Pack years: 12.00    Types: Cigarettes    Quit date: 11/24/1963    Years since quitting: 56.9  . Smokeless tobacco: Never Used  Vaping Use  . Vaping Use: Never used  Substance Use Topics  . Alcohol use: No    Alcohol/week: 0.0 standard drinks    Comment: Rare  . Drug use: No   Family History  Problem Relation Age of Onset  . Cancer Mother        Colon  . Heart disease Father        CAD  . Diabetes Sister   . Hypothyroidism Sister   . Diabetes Brother   . Leukemia Brother   . Cancer Paternal Aunt        Breast  . Diabetes Sister    Allergies  Allergen Reactions  . Amlodipine Besylate Other (See Comments)    Reaction:  Increases BP  . Antihistamines, Chlorpheniramine-Type Other (See Comments)    Reaction:  Makes pt hyper   . Atenolol Hypertension  . Cefuroxime Axetil Other (See Comments)    Upset stomach  . Chlorthalidone Other (See Comments)  . Ciprofloxacin Other (See Comments)    Upset stomach  . Co Q 10 [Coenzyme Q10] Other (See Comments)    Reaction:   GI upset   . Crestor [Rosuvastatin Calcium] Other (See Comments)    Reaction:  Myalgias   . Dextromethorphan-Guaifenesin Other (See Comments)    Reaction:  Made pt jittery   . Epinephrine Hives  . Other     Reaction:  Makes pt hyper   . Ramipril Other (See Comments)    Upset stomach  . Sulfamethoxazole-Trimethoprim Other (See Comments)    Upset stomach  . Vitamin D Analogs Other (See Comments)    Reaction:  GI upset    Current Outpatient Medications on File  Prior to Visit  Medication Sig Dispense Refill  . acetaminophen (TYLENOL) 500 MG tablet Take 500 mg by mouth every 6 (six) hours as needed.    [name] Kitchen aspirin EC 81 MG tablet Take 1 tablet (81 mg total) by mouth daily. 30 tablet 2  . atorvastatin (LIPITOR) 10 MG tablet TAKE 1 TABLET BY MOUTH EVERY DAY AT 6 PM 90 tablet 3  . Calcium Carbonate-Vitamin D (CALCIUM 600+D PO) Take 1 tablet by mouth daily.     . chlorthalidone (HYGROTON) 25 MG tablet Take 25 mg by mouth daily. 1 pill by mouth every 3 days    . clobetasol ointment (TEMOVATE) 9.56 % Apply 1 application topically 2 (two) times daily. To affected area 30 g 1  . clorazepate (TRANXENE) 7.5 MG tablet Take 1 tablet (7.5 mg total) by mouth daily as needed. 30 tablet 0  . cyanocobalamin (,VITAMIN B-12,) 1000 MCG/ML injection Inject 1,000 mcg into the muscle every 30 (thirty) days.     . irbesartan (AVAPRO) 300 MG tablet Take 1 tablet (300 mg total) by mouth daily. 90 tablet 3  . methocarbamol (ROBAXIN) 500 MG tablet Take 1 tablet (500 mg total) by mouth 3 (three) times daily as needed for muscle spasms (back pain). Caution of sedation 30 tablet 1  . omeprazole (PRILOSEC) 20 MG capsule TAKE 1 CAPSULE(20 MG) BY MOUTH BID PRN 180 capsule 1  . triamcinolone (NASACORT) 55 MCG/ACT AERO nasal inhaler Place 2 sprays into the nose daily. 1 Inhaler 12   No current facility-administered medications on file prior to visit.    Review of Systems  Constitutional: Negative for activity change,  appetite change, fatigue, fever and unexpected weight change.  HENT: Negative for congestion, ear pain, rhinorrhea, sinus pressure and sore throat.   Eyes: Negative for pain, redness and visual disturbance.  Respiratory: Negative for cough, shortness of breath and wheezing.   Cardiovascular: Negative for chest pain and palpitations.  Gastrointestinal: Negative for abdominal pain, blood in stool, constipation and diarrhea.  Endocrine: Negative for polydipsia and polyuria.  Genitourinary: Negative for dysuria, frequency and urgency.  Musculoskeletal: Positive for arthralgias. Negative for back pain, gait problem, joint swelling and myalgias.  Skin: Negative for pallor and rash.  Allergic/Immunologic: Negative for environmental allergies.  Neurological: Negative for dizziness, syncope and headaches.  Hematological: Negative for adenopathy. Does not bruise/bleed easily.  Psychiatric/Behavioral: Negative for decreased concentration and dysphoric mood. The patient is not nervous/anxious.        Objective:   Physical Exam Constitutional:      General: She is not in acute distress.    Appearance: Normal appearance. She is obese. She is not ill-appearing.  HENT:     Mouth/Throat:     Mouth: Mucous membranes are moist.  Eyes:     General: No scleral icterus.    Conjunctiva/sclera: Conjunctivae normal.     Pupils: Pupils are equal, round, and reactive to light.  Cardiovascular:     Rate and Rhythm: Normal rate and regular rhythm.  Musculoskeletal:        General: Tenderness present. No swelling.     Right shoulder: Tenderness and bony tenderness present. No crepitus. Decreased range of motion. Normal strength. Normal pulse.     Cervical back: Normal range of motion and neck supple. No rigidity or tenderness.     Comments: Right shoulder  No deformity/swelling/warmth or erythema  No crepitus  No obvious effusion  Abduction 30 deg with pain  Hawking test -pos Neer test pos Internal  rotation painful External rotation painful Tenderness -mild/bicep tendon  Normal grip and hand dexterity   No acute swelling in joints of hands  Tender R 5th mcp joint  No redness No deformity  No rashes  Lymphadenopathy:     Cervical: No cervical adenopathy.  Neurological:     Mental Status: She is alert.     Sensory: No sensory deficit.     Motor: No weakness.  Psychiatric:        Mood and Affect: Mood normal.           Assessment & Plan:   Problem List Items Addressed This Visit      Other   B12 deficiency    B12 shot given today      Right shoulder pain - Primary    With some rotator cuff/impingement signs and symptoms  This occurred after strain (no direct trauma)  Xray today Change ibuprofen to meloxicam to take with food  Ice more frequently  Given rom exercises to do  May need orthopedic attn -pending rad review        Relevant Orders   DG Shoulder Right (Completed)   Joint pain   Relevant Orders   ANA   CBC with Differential/Platelet (Completed)   Sedimentation Rate (Completed)   Rheumatoid factor (Completed)   Joint swelling    Hands with some am stiffness and pain   Labs today for autoimmune joint dz screening  No deformity on exam      Relevant Orders   ANA   CBC with Differential/Platelet (Completed)   Sedimentation Rate (Completed)   Rheumatoid factor (Completed)    Other Visit Diagnoses    Need for influenza vaccination       Relevant Orders   Flu Vaccine QUAD High Dose(Fluad) (Completed)

## 2020-10-16 NOTE — Patient Instructions (Addendum)
Continue ice to shoulder  Avoid heavy lifting  Try the range of motion exercises  Stop ibuprofen  Take meloxicam 15 mg once daily as needed with food and keep up a good fluid intake  Xray of shoulder now   Labs today for auto immune joint disease (screening)   B12 shot and flu shot  today

## 2020-10-19 ENCOUNTER — Telehealth: Payer: Self-pay | Admitting: Family Medicine

## 2020-10-19 ENCOUNTER — Encounter: Payer: Self-pay | Admitting: Family Medicine

## 2020-10-19 DIAGNOSIS — M25511 Pain in right shoulder: Secondary | ICD-10-CM

## 2020-10-19 NOTE — Assessment & Plan Note (Signed)
With some rotator cuff/impingement signs and symptoms  This occurred after strain (no direct trauma)  Xray today Change ibuprofen to meloxicam to take with food  Ice more frequently  Given rom exercises to do  May need orthopedic attn -pending rad review

## 2020-10-19 NOTE — Telephone Encounter (Signed)
-----   Message from Tammi Sou, [place] sent at 10/16/2020  4:54 PM EST ----- Pt notified of the xray results and Dr. Marliss Coots comments. Pt does want to see Ortho, she has gone to [facility] in the past and would like to go back there if possible if not she said somewhere in [place] is fine, I advise pt PCP will put referral in and our [facility] will call to schedule appt

## 2020-10-19 NOTE — Assessment & Plan Note (Signed)
Hands with some am stiffness and pain   Labs today for autoimmune joint dz screening  No deformity on exam

## 2020-10-19 NOTE — Assessment & Plan Note (Signed)
B12 shot given today

## 2020-10-21 LAB — ANA: Anti Nuclear Antibody (ANA): NEGATIVE

## 2020-10-21 LAB — RHEUMATOID FACTOR: Rheumatoid fact SerPl-aCnc: 26 IU/mL — ABNORMAL HIGH (ref <14)

## 2020-10-23 ENCOUNTER — Telehealth: Payer: Self-pay | Admitting: Family Medicine

## 2020-10-23 DIAGNOSIS — M2540 Effusion, unspecified joint: Secondary | ICD-10-CM

## 2020-10-23 DIAGNOSIS — M25561 Pain in right knee: Secondary | ICD-10-CM | POA: Diagnosis not present

## 2020-10-23 DIAGNOSIS — M2550 Pain in unspecified joint: Secondary | ICD-10-CM

## 2020-10-23 DIAGNOSIS — M25562 Pain in left knee: Secondary | ICD-10-CM | POA: Diagnosis not present

## 2020-10-23 NOTE — Telephone Encounter (Signed)
-----   Message from Tammi Sou, [place] sent at 10/23/2020  4:46 PM EST ----- Pt notified of lab results and Dr. Marliss Coots comments. Pt said that she saw Ortho today and they said that she prob has a rotator cuff tear but it's small and there is nothing they can do, she will just have to live with it, so pt is done with Ortho and would like to go a head and proceed with Rheumatologist referral. Pt would like to see someone in [place] if possible.   ##pt did have another question. She has been getting cortisone shots in her hip/knee and wanted to know if that could have affected her labs

## 2020-10-23 NOTE — Telephone Encounter (Signed)
Good question, I don't think so however  Referral done

## 2020-10-24 NOTE — Telephone Encounter (Signed)
Pt.notified

## 2020-10-25 DIAGNOSIS — Z23 Encounter for immunization: Secondary | ICD-10-CM | POA: Diagnosis not present

## 2020-11-04 ENCOUNTER — Telehealth: Payer: Self-pay | Admitting: Family Medicine

## 2020-11-04 NOTE — Telephone Encounter (Signed)
Could someone help her with a rheumatology appt with a practice in [facility]?  I think the referral is already in  Thanks

## 2020-11-04 NOTE — Telephone Encounter (Signed)
Pt called in wanted to get a rhuemtologist in [facility] and called [facility] and the number is [phone], and when she called they stated she needed a referral

## 2020-11-05 NOTE — Telephone Encounter (Signed)
Referral sent to [facility], placed on their WQ for review. Patient is aware.

## 2020-11-24 NOTE — Progress Notes (Addendum)
Office Visit Note  Patient: Tammy Manning             Date of Birth: 1935/04/18           MRN: [id number]             PCP: Abner Greenspan, MD Referring: [name], [name], MD Visit Date: 11/25/2020   Subjective:  New Patient (Initial Visit)   History of Present Illness: Tammy Manning is a 86 y.o. female with a history of afib, TIA, and OA here for evaluation of joint pain and positive rheumatoid factor. She has joint pain in multiple sites including both hands but also has a lot of pain with the right shoulder that started more acutely with suspected rotator cuff tear. 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 She does notice swelling in hands intermittently. She has morning stiffness lasting less than 30 minutes daily also persistent pain throughout the day. She has had left radius fracture with surgical repair but no other major surgery of her arms.  Labs reviewed 09/2020 RF 26 ESR wnl ANA neg  Imaging reviewed 09/2020 Xray right shoulder Mild AC joint OA, glenohumeral joint near normal, cystic change in tuberosity  Activities of Daily Living:  Patient reports morning stiffness for 10-15 minutes.   Patient Reports nocturnal pain.  Difficulty dressing/grooming: Reports Difficulty climbing stairs: Denies Difficulty getting out of chair: Denies Difficulty using hands for taps, buttons, cutlery, and/or writing: Denies  Review of Systems  Constitutional: Positive for fatigue.  HENT: Positive for mouth dryness. Negative for mouth sores and nose dryness.   Eyes: Negative for pain, itching, visual disturbance and dryness.  Respiratory: Negative for cough, hemoptysis, shortness of breath and difficulty breathing.   Cardiovascular: Negative for chest pain, palpitations and swelling in legs/feet.  Gastrointestinal: Positive for constipation and diarrhea. Negative for abdominal pain and blood in stool.  Endocrine: Negative  for increased urination.  Genitourinary: Negative for painful urination.  Musculoskeletal: Positive for arthralgias, joint pain, joint swelling, myalgias, muscle weakness, morning stiffness and myalgias. Negative for muscle tenderness.  Skin: Negative for color change, rash and redness.  Allergic/Immunologic: Negative for susceptible to infections.  Neurological: Positive for numbness. Negative for dizziness, headaches, memory loss and weakness.  Hematological: Negative for swollen glands.  Psychiatric/Behavioral: Negative for confusion and sleep disturbance.    PMFS History:  Patient Active Problem List   Diagnosis Date Noted  . Rheumatoid factor positive 11/25/2020  . Bilateral hand pain 11/25/2020  . Right shoulder pain 10/16/2020  . Joint pain 10/16/2020  . Joint swelling 10/16/2020  . Acute sinusitis 10/09/2020  . Estrogen deficiency 04/30/2020  . Low back pain 03/15/2020  . Dry mouth 03/15/2020  . Osteoarthritis of right hip 01/02/2020  . Groin pain, right 01/01/2020  . Trochanteric bursitis of right hip 11/09/2018  . Transient global amnesia 05/12/2018  . Right thyroid nodule 05/12/2018  . TIA (transient ischemic attack) 05/09/2018  . Aortic insufficiency 09/10/2015  . Stress reaction 09/08/2015  . Encounter for Medicare annual wellness exam 02/28/2013  . Bucket-handle tear of lateral meniscus of left knee as current injury 12/30/2012  . Episodic atrial fibrillation ([place]) 11/17/2012  . Special screening for malignant neoplasms, colon 01/21/2012  . Hearing loss of both ears 01/14/2012  . Adverse drug reaction 03/12/2011  . HEADACHE, CHRONIC 09/24/2010  . Leg pain 08/04/2010  . B12 deficiency 03/17/2010  . DYSPEPSIA 03/17/2010  . POSTMENOPAUSAL STATUS 08/20/2008  .  Essential hypertension 07/19/2007  . Hyperlipidemia 06/24/2007  . CONSTIPATION 06/24/2007  . IBS 06/24/2007  . DERMATITIS, ATOPIC 06/24/2007  . SKIN CANCER, HX OF 06/24/2007    Past Medical History:   Diagnosis Date  . Anginal pain ([place])   . Arthritis    Knees  . B12 deficiency    Mild  . Bucket-handle tear of lateral meniscus of left knee as current injury 12/30/2012  . Cancer (HCC)    Skin, basal cell on nose and eyelid  . DJD (degenerative joint disease)    Low back  . Dyspepsia   . GERD (gastroesophageal reflux disease)   . Heart murmur   . Heart palpitations   . HOH (hard of hearing)   . Hyperlipidemia   . Hypertension   . Lactose intolerance   . Mixed incontinence   . PONV (postoperative nausea and vomiting)   . Vulvar irritation    from urine pad, uses Triamcinolone oint.    Family History  Problem Relation Age of Onset  . Cancer Mother        Colon  . Heart disease Father        CAD  . Diabetes Sister   . Hypothyroidism Sister   . Diabetes Brother   . Leukemia Brother   . Esophageal cancer Brother   . Cancer Paternal Aunt        Breast  . Diabetes Sister   . Alzheimer's disease Sister   . Diabetes Brother   . Breast cancer Daughter    Past Surgical History:  Procedure Laterality Date  . ABDOMINAL AORTOGRAM N/A 03/15/2017   Procedure: Abdominal Aortogram;  Surgeon: Wellington Hampshire, MD;  Location: [place] [facility];  Service: Cardiovascular;  Laterality: N/A;  . ABDOMINAL HYSTERECTOMY  1982   Large fibroids and bleeding  . APPENDECTOMY    . BILATERAL SALPINGOOPHORECTOMY  1986  . BONE GRAFT HIP ILIAC CREST     To Left arm  . BREAST REDUCTION SURGERY    . BREAST SURGERY    . CATARACT EXTRACTION W/PHACO Left 07/07/2016   Procedure: CATARACT EXTRACTION PHACO AND INTRAOCULAR LENS PLACEMENT (IOC);  Surgeon: Birder Robson, MD;  Location: [facility];  Service: Ophthalmology;  Laterality: Left;  [place] 01:10 AP% 18.3 CDE 12.91 Fluid pak lot # [id number]  . CATARACT EXTRACTION W/PHACO Right 07/28/2016   Procedure: CATARACT EXTRACTION PHACO AND INTRAOCULAR LENS PLACEMENT ([place]);  Surgeon: Birder Robson, MD;  Location: [facility];  Service: Ophthalmology;   Laterality: Right;  [place] 01:26 AP% 18.5 CDE 16.12 Fluid pack lot # [id number]  . COLONOSCOPY  07/2016   Dr. Thana Farr  . DILATION AND CURETTAGE OF UTERUS     miscarragesx2  . ESOPHAGOGASTRODUODENOSCOPY     Polyps  . [place]  2006  . KNEE ARTHROSCOPY WITH LATERAL MENISECTOMY Left 12/30/2012   Procedure: KNEE ARTHROSCOPY WITH LATERAL MENISECTOMY;  Surgeon: Johnny Bridge, MD;  Location: [facility];  Service: Orthopedics;  Laterality: Left;  LEFT KNEE SCOPE LATERAL MENISCECTOMY  . LEFT HEART CATH AND CORONARY ANGIOGRAPHY N/A 03/15/2017   Procedure: Left Heart Cath and Coronary Angiography;  Surgeon: Wellington Hampshire, MD;  Location: [place] [facility];  Service: Cardiovascular;  Laterality: N/A;  . MOUTH SURGERY    . skin cancer eyelid  2012  . TONSILLECTOMY     Social History   Social History Narrative   Metallurgist.     Immunization History  Administered Date(s) Administered  . Fluad Quad(high Dose 65+)  10/16/2020  . Influenza Split 07/24/2011, 08/23/2014  . Influenza Whole 08/23/2004, 07/24/2009, 08/06/2010, 08/05/2012  . Influenza, High Dose Seasonal PF 08/11/2016, 08/27/2017, 08/02/2018  . Influenza,inj,Quad PF,6+ Mos 08/16/2013, 08/07/2015  . PFIZER SARS-COV-2 Vaccination 12/15/2019, 01/05/2020, 10/25/2020  . Pneumococcal Conjugate-13 08/24/2014  . Pneumococcal Polysaccharide-23 08/20/2008  . Td 03/20/2003, 02/28/2013  . Zoster 09/04/2015     Objective: Vital Signs: BP (!) 162/75 (BP Location: Left Arm, Patient Position: Sitting, Cuff Size: Normal)   Pulse 69   Ht 5' 4"  (1.626 m)   Wt 191 lb (86.6 kg)   BMI 32.79 kg/m    Physical Exam HENT:     Right Ear: External ear normal.     Left Ear: External ear normal.     Mouth/Throat:     Mouth: Mucous membranes are moist.     Pharynx: Oropharynx is clear.  Eyes:     Conjunctiva/sclera: Conjunctivae normal.  Cardiovascular:     Rate and Rhythm: Normal rate and regular rhythm.  Pulmonary:      Effort: Pulmonary effort is normal.     Breath sounds: Normal breath sounds.  Skin:    General: Skin is warm and dry.     Findings: No rash.  Neurological:     General: No focal deficit present.     Mental Status: She is alert.  Psychiatric:        Mood and Affect: Mood normal.     Musculoskeletal Exam:  Neck full range of motion Right shoulder difficulty with active abduction overhead, positive painful arc, passive ROM intact, elbow, wrist, fingers full range of motion no swelling, heberdon's nodes present CMC joint squaring present Normal hip internal and external rotation Knees crepitus present full range of motion no swelling   Investigation: No additional findings.  Imaging: XR Hand 2 View Left  Result Date: 11/25/2020 X-ray left hand 2 views Surgical hardware present in radius.  Radiocarpal joint space appears intact.  First CMC joint osteoarthritis and partial subluxation of first metacarpal.  MCP joints appear normal.  DIP joint asymmetric narrowing and sclerosis with small osteophytes present.  Generalized osteopenia present.  No soft tissue swelling seen. Impression Osteoarthritis with most significant changes at first [facility] joint and multiple DIP joint changes also seen.  XR Hand 2 View Right  Result Date: 11/25/2020 X-ray right hand 2 views Radiocarpal joint space appears normal.  Carpal bones mostly normal probable cystic change in the proximal scaphoid and mild first CMC joint arthritis.  MCP joints appear intact.  First digit interphalangeal joint large osteophytes present.  Mild PIP with more DIP especially second and third digits joint space narrowing and sclerosis.  Generalized osteopenia.  No soft tissue swelling seen. Impression Degenerative joint changes and osteophytes worst in first [place] and 2nd-3rd DIPs.   Recent Labs: Lab Results  Component Value Date   WBC 8.4 10/16/2020   HGB 12.6 10/16/2020   PLT 363.0 10/16/2020   NA 138 04/25/2020   K 4.0 04/25/2020    CL 104 04/25/2020   CO2 25 04/25/2020   GLUCOSE 88 04/25/2020   BUN 18 04/25/2020   CREATININE 0.81 04/25/2020   BILITOT 0.4 04/25/2020   ALKPHOS 61 04/25/2020   AST 13 04/25/2020   ALT 10 04/25/2020   PROT 6.6 04/25/2020   ALBUMIN 3.9 04/25/2020   CALCIUM 9.5 04/25/2020   GFRAA 71 02/02/2020    Speciality Comments: No specialty comments available.  Procedures:  No procedures performed Allergies: Amlodipine besylate; Antihistamines, chlorpheniramine-type; Atenolol; Cefuroxime axetil;  Chlorthalidone; Ciprofloxacin; Co q 10 [coenzyme q10]; Crestor [rosuvastatin calcium]; Dextromethorphan-guaifenesin; Epinephrine; Other; Ramipril; Sulfamethoxazole-trimethoprim; and Vitamin d analogs   Assessment / Plan:     Visit Diagnoses: Rheumatoid factor positive Bilateral hand pain - Plan: XR Hand 2 View Right, XR Hand 2 View Left  No active synovitis seen on physical exam. Bony nodules present. Short duration morning stiffness also consistent with osteoarthritis which seems to be the case currently. Xray shows osteophytosis and joint space narrowing no erosions or juxtarticular osteopenia. Positive serology but no obvious disease activity we can repeat assessment and see if any trend in symptoms but no current indication for DMARD treatment.  Acute pain of right shoulder  Right shoulder pain and difficulty with overhead abduction. Discussed need for range of motion exercises otherwise avoid heavy loads at this time.  Orders: Orders Placed This Encounter  Procedures  . XR Hand 2 View Right  . XR Hand 2 View Left   No orders of the defined types were placed in this encounter.   Follow-Up Instructions: Return in about 4 weeks (around 12/23/2020) for Possible RA f/u.   Collier Salina, MD  Note - This record has been created using [employer].  Chart creation errors have been sought, but may not always  have been located. Such creation errors do not reflect on  the standard of  medical care.

## 2020-11-25 ENCOUNTER — Ambulatory Visit: Payer: Self-pay

## 2020-11-25 ENCOUNTER — Other Ambulatory Visit: Payer: Self-pay

## 2020-11-25 ENCOUNTER — Ambulatory Visit (INDEPENDENT_AMBULATORY_CARE_PROVIDER_SITE_OTHER): Payer: Medicare Other | Admitting: Internal Medicine

## 2020-11-25 ENCOUNTER — Encounter: Payer: Self-pay | Admitting: Internal Medicine

## 2020-11-25 VITALS — BP 162/75 | HR 69 | Ht 64.0 in | Wt 191.0 lb

## 2020-11-25 DIAGNOSIS — M79641 Pain in right hand: Secondary | ICD-10-CM | POA: Diagnosis not present

## 2020-11-25 DIAGNOSIS — M25511 Pain in right shoulder: Secondary | ICD-10-CM

## 2020-11-25 DIAGNOSIS — M059 Rheumatoid arthritis with rheumatoid factor, unspecified: Secondary | ICD-10-CM | POA: Insufficient documentation

## 2020-11-25 DIAGNOSIS — M79642 Pain in left hand: Secondary | ICD-10-CM | POA: Diagnosis not present

## 2020-11-25 DIAGNOSIS — R768 Other specified abnormal immunological findings in serum: Secondary | ICD-10-CM

## 2020-11-27 DIAGNOSIS — M25511 Pain in right shoulder: Secondary | ICD-10-CM | POA: Diagnosis not present

## 2020-11-27 NOTE — Progress Notes (Signed)
Hand xrays show osteoarthritis of the hands no evidence of rheumatoid arthritis seen.

## 2020-11-30 DIAGNOSIS — M25511 Pain in right shoulder: Secondary | ICD-10-CM | POA: Diagnosis not present

## 2020-12-04 ENCOUNTER — Ambulatory Visit (INDEPENDENT_AMBULATORY_CARE_PROVIDER_SITE_OTHER): Payer: Medicare Other

## 2020-12-04 ENCOUNTER — Other Ambulatory Visit: Payer: Self-pay

## 2020-12-04 DIAGNOSIS — M25511 Pain in right shoulder: Secondary | ICD-10-CM | POA: Diagnosis not present

## 2020-12-04 DIAGNOSIS — E538 Deficiency of other specified B group vitamins: Secondary | ICD-10-CM | POA: Diagnosis not present

## 2020-12-04 DIAGNOSIS — M542 Cervicalgia: Secondary | ICD-10-CM | POA: Diagnosis not present

## 2020-12-04 MED ORDER — CYANOCOBALAMIN 1000 MCG/ML IJ SOLN
1000.0000 ug | Freq: Once | INTRAMUSCULAR | Status: AC
  Administered 2020-12-04: 1000 ug via INTRAMUSCULAR

## 2020-12-04 NOTE — Progress Notes (Signed)
Per orders of Dr. Glori Bickers, injection of vit S11 given by Brenton Grills. Patient tolerated injection well.

## 2020-12-06 ENCOUNTER — Other Ambulatory Visit: Payer: Self-pay | Admitting: Family Medicine

## 2020-12-06 NOTE — Telephone Encounter (Signed)
Last OV was 10/16/20 for shoulder pain, last filled on 10/16/20 #30 tabs with 1 refills  FYI note on Rx says pt wants 90 day Rx

## 2020-12-17 DIAGNOSIS — M25511 Pain in right shoulder: Secondary | ICD-10-CM | POA: Diagnosis not present

## 2020-12-20 DIAGNOSIS — M25511 Pain in right shoulder: Secondary | ICD-10-CM | POA: Diagnosis not present

## 2020-12-22 NOTE — Progress Notes (Signed)
Office Visit Note  Patient: Tammy Manning             Date of Birth: [birth year]           MRN: [id number]             PCP: Abner Greenspan, MD Referring: [name], [name], MD Visit Date: 12/23/2020   Subjective:  Follow-up (Patient complains of worsening symptoms since last visit. Since last visit patient had MRI of the shoulder revealing no tear. Patient has been doing PT. )   History of Present Illness: Leonore Frankson is a 86 y.o. female with a history of afib and TIA here for follow up of joint pains and positive rheumatoid factor, initial visit no active joint inflammation was seen. Hand xrays were obtained with no erosive disease noted, osteoarthritis changes were present. Since her last visit she notices symptoms are worse with pain in the right shoulder and left knee she has started PT for the shoulder 3 sessions so far thinks this is starting to improve her ROM although still very stiff. Previous injections helped about 3-4 weeks. The left knee is more swollen and stiff she is following up for orthopedics with this upcoming appointment 12/25/20. No other peripheral joint swelling. Morning stiffness lasting up to 1 hour duration worst in the knee.    Labs reviewed 09/2020 RF 26 ESR wnl ANA neg  Imaging reviewed 09/2020 Xray right shoulder Mild AC joint OA, glenohumeral joint near normal, cystic change in tuberosity  Review of Systems  Constitutional: Positive for fatigue.  HENT: Positive for mouth dryness. Negative for mouth sores and nose dryness.   Eyes: Negative for pain, itching, visual disturbance and dryness.  Respiratory: Negative for cough, hemoptysis, shortness of breath and difficulty breathing.   Cardiovascular: Negative for chest pain, palpitations and swelling in legs/feet.  Gastrointestinal: Negative for abdominal pain, blood in stool, constipation and diarrhea.  Endocrine: Negative for increased urination.  Genitourinary: Negative for painful urination.   Musculoskeletal: Positive for arthralgias, joint pain, joint swelling, myalgias, muscle weakness, morning stiffness, muscle tenderness and myalgias.  Skin: Negative for color change, rash and redness.  Allergic/Immunologic: Negative for susceptible to infections.  Neurological: Positive for numbness. Negative for dizziness, headaches, memory loss and weakness.  Hematological: Negative for swollen glands.  Psychiatric/Behavioral: Positive for sleep disturbance. Negative for confusion.    PMFS History:  Patient Active Problem List   Diagnosis Date Noted  . Elevated rheumatoid factor 11/25/2020  . Bilateral hand pain 11/25/2020  . Pain in right shoulder 10/16/2020  . Joint pain 10/16/2020  . Joint swelling 10/16/2020  . Acute sinusitis 10/09/2020  . Estrogen deficiency 04/30/2020  . Low back pain 03/15/2020  . Dry mouth 03/15/2020  . Osteoarthritis of right hip 01/02/2020  . Groin pain, right 01/01/2020  . Trochanteric bursitis of right hip 11/09/2018  . Transient global amnesia 05/12/2018  . Right thyroid nodule 05/12/2018  . TIA (transient ischemic attack) 05/09/2018  . Aortic insufficiency 09/10/2015  . Stress reaction 09/08/2015  . Encounter for Medicare annual wellness exam 02/28/2013  . Bucket-handle tear of lateral meniscus of left knee as current injury 12/30/2012  . Episodic atrial fibrillation ([place]) 11/17/2012  . Special screening for malignant neoplasms, colon 01/21/2012  . Hearing loss of both ears 01/14/2012  . Adverse drug reaction 03/12/2011  . HEADACHE, CHRONIC 09/24/2010  . Leg pain 08/04/2010  . B12 deficiency 03/17/2010  . DYSPEPSIA 03/17/2010  . POSTMENOPAUSAL STATUS 08/20/2008  . Essential hypertension 07/19/2007  .  Hyperlipidemia 06/24/2007  . CONSTIPATION 06/24/2007  . IBS 06/24/2007  . DERMATITIS, ATOPIC 06/24/2007  . SKIN CANCER, HX OF 06/24/2007    Past Medical History:  Diagnosis Date  . Anginal pain ([place])   . Arthritis    Knees  . B12  deficiency    Mild  . Bucket-handle tear of lateral meniscus of left knee as current injury 12/30/2012  . Cancer (HCC)    Skin, basal cell on nose and eyelid  . DJD (degenerative joint disease)    Low back  . Dyspepsia   . GERD (gastroesophageal reflux disease)   . Heart murmur   . Heart palpitations   . HOH (hard of hearing)   . Hyperlipidemia   . Hypertension   . Lactose intolerance   . Mixed incontinence   . PONV (postoperative nausea and vomiting)   . Vulvar irritation    from urine pad, uses Triamcinolone oint.    Family History  Problem Relation Age of Onset  . Cancer Mother        Colon  . Heart disease Father        CAD  . Diabetes Sister   . Hypothyroidism Sister   . Diabetes Brother   . Leukemia Brother   . Esophageal cancer Brother   . Cancer Paternal Aunt        Breast  . Diabetes Sister   . Alzheimer's disease Sister   . Diabetes Brother   . Breast cancer Daughter    Past Surgical History:  Procedure Laterality Date  . ABDOMINAL AORTOGRAM N/A 03/15/2017   Procedure: Abdominal Aortogram;  Surgeon: Wellington Hampshire, MD;  Location: [place] [facility];  Service: Cardiovascular;  Laterality: N/A;  . ABDOMINAL HYSTERECTOMY  1982   Large fibroids and bleeding  . APPENDECTOMY    . BILATERAL SALPINGOOPHORECTOMY  1986  . BONE GRAFT HIP ILIAC CREST     To Left arm  . BREAST REDUCTION SURGERY    . BREAST SURGERY    . CATARACT EXTRACTION W/PHACO Left 07/07/2016   Procedure: CATARACT EXTRACTION PHACO AND INTRAOCULAR LENS PLACEMENT (IOC);  Surgeon: Birder Robson, MD;  Location: [facility];  Service: Ophthalmology;  Laterality: Left;  [place] 01:10 AP% 18.3 CDE 12.91 Fluid pak lot # [id number]  . CATARACT EXTRACTION W/PHACO Right 07/28/2016   Procedure: CATARACT EXTRACTION PHACO AND INTRAOCULAR LENS PLACEMENT ([place]);  Surgeon: Birder Robson, MD;  Location: [facility];  Service: Ophthalmology;  Laterality: Right;  [place] 01:26 AP% 18.5 CDE 16.12 Fluid pack lot # [id number]  .  COLONOSCOPY  07/2016   Dr. Thana Farr  . DILATION AND CURETTAGE OF UTERUS     miscarragesx2  . ESOPHAGOGASTRODUODENOSCOPY     Polyps  . [place]  2006  . KNEE ARTHROSCOPY WITH LATERAL MENISECTOMY Left 12/30/2012   Procedure: KNEE ARTHROSCOPY WITH LATERAL MENISECTOMY;  Surgeon: Johnny Bridge, MD;  Location: [place];  Service: Orthopedics;  Laterality: Left;  LEFT KNEE SCOPE LATERAL MENISCECTOMY  . LEFT HEART CATH AND CORONARY ANGIOGRAPHY N/A 03/15/2017   Procedure: Left Heart Cath and Coronary Angiography;  Surgeon: Wellington Hampshire, MD;  Location: [place] [facility];  Service: Cardiovascular;  Laterality: N/A;  . MOUTH SURGERY    . skin cancer eyelid  2012  . TONSILLECTOMY     Social History   Social History Narrative   Metallurgist.     Immunization History  Administered Date(s) Administered  . Fluad Quad(high Dose 65+) 10/16/2020  . Influenza Split  07/24/2011, 08/23/2014  . Influenza Whole 08/23/2004, 07/24/2009, 08/06/2010, 08/05/2012  . Influenza, High Dose Seasonal PF 08/11/2016, 08/27/2017, 08/02/2018  . Influenza,inj,Quad PF,6+ Mos 08/16/2013, 08/07/2015  . PFIZER(Purple Top)SARS-COV-2 Vaccination 12/15/2019, 01/05/2020, 10/25/2020  . Pneumococcal Conjugate-13 08/24/2014  . Pneumococcal Polysaccharide-23 08/20/2008  . Td 03/20/2003, 02/28/2013  . Zoster 09/04/2015     Objective: Vital Signs: BP (!) 166/80 (BP Location: Right Arm, Patient Position: Sitting, Cuff Size: Normal)   Pulse 90   Ht 5' 4"  (1.626 m)   Wt 192 lb (87.1 kg)   BMI 32.96 kg/m    Physical Exam HENT:     Right Ear: External ear normal.     Left Ear: External ear normal.  Eyes:     Conjunctiva/sclera: Conjunctivae normal.  Skin:    General: Skin is warm and dry.     Findings: No rash.  Neurological:     General: No focal deficit present.     Mental Status: She is alert.  Psychiatric:        Mood and Affect: Mood normal.      Musculoskeletal Exam:  Neck full  range of motion Right shoulder stiffness and sore with overhead abduction, slightly reduced passive internal rotation and passive abduction ROM, left shoulder normal Elbow, wrist, fingers full range of motion no swelling, heberdon's nodes present on right DIPs, CMC joint squaring present Knees crepitus present b/l, left knee swollen with anterior and posterior fullness and mild warmth to touch, reduced flexion ROM bends to about 60 degrees position with pain   CDAI Exam: CDAI Score: - Patient Global: -; Provider Global: - Swollen: 1 ; Tender: 2  Joint Exam 12/23/2020      Right  Left  Glenohumeral   Tender     Knee     Swollen Tender     Investigation: No additional findings.  Imaging: XR Hand 2 View Left  Result Date: 11/25/2020 X-ray left hand 2 views Surgical hardware present in radius.  Radiocarpal joint space appears intact.  First CMC joint osteoarthritis and partial subluxation of first metacarpal.  MCP joints appear normal.  DIP joint asymmetric narrowing and sclerosis with small osteophytes present.  Generalized osteopenia present.  No soft tissue swelling seen. Impression Osteoarthritis with most significant changes at first [facility] joint and multiple DIP joint changes also seen.  XR Hand 2 View Right  Result Date: 11/25/2020 X-ray right hand 2 views Radiocarpal joint space appears normal.  Carpal bones mostly normal probable cystic change in the proximal scaphoid and mild first CMC joint arthritis.  MCP joints appear intact.  First digit interphalangeal joint large osteophytes present.  Mild PIP with more DIP especially second and third digits joint space narrowing and sclerosis.  Generalized osteopenia.  No soft tissue swelling seen. Impression Degenerative joint changes and osteophytes worst in first [place] and 2nd-3rd DIPs.   Recent Labs: Lab Results  Component Value Date   WBC 8.4 10/16/2020   HGB 12.6 10/16/2020   PLT 363.0 10/16/2020   NA 138 04/25/2020   K 4.0  04/25/2020   CL 104 04/25/2020   CO2 25 04/25/2020   GLUCOSE 88 04/25/2020   BUN 18 04/25/2020   CREATININE 0.81 04/25/2020   BILITOT 0.4 04/25/2020   ALKPHOS 61 04/25/2020   AST 13 04/25/2020   ALT 10 04/25/2020   PROT 6.6 04/25/2020   ALBUMIN 3.9 04/25/2020   CALCIUM 9.5 04/25/2020   GFRAA 71 02/02/2020    Speciality Comments: No specialty comments available.  Procedures:  No procedures performed Allergies: Amlodipine besylate; Antihistamines, chlorpheniramine-type; Atenolol; Cefuroxime axetil; Chlorthalidone; Ciprofloxacin; Co q 10 [coenzyme q10]; Crestor [rosuvastatin calcium]; Dextromethorphan-guaifenesin; Epinephrine; Other; Ramipril; Sulfamethoxazole-trimethoprim; and Vitamin d analogs   Assessment / Plan:     Visit Diagnoses: Elevated rheumatoid factor  Chronic right shoulder pain - Plan: Sedimentation rate, Cyclic citrul peptide antibody, IgG  Chronic right shoulder pain without significant structural abnormality and positive rheumatoid factor but previously negative inflammatory markers symptoms seem to be improving with physical therapy.  Left knee effusion but has previous injury and posttraumatic osteoarthritis at the site.  Overall I think presentation less likely to represent RA will repeat more specific serology and inflammatory markers now further out since steroid injections.  If not significantly positive did not think RA is the current cause of symptoms at this time.  Bucket-handle tear of lateral meniscus of left knee as current injury, subsequent encounter  Moderate left knee effusion with warmth probable Baker's cyst and restricted flexion range of motion I think unlikely RA as cause. Currently using cane and she has upcoming follow-up in 2 days we will contact after lab results reviewed.  Orders: Orders Placed This Encounter  Procedures  . Sedimentation rate  . Cyclic citrul peptide antibody, IgG   No orders of the defined types were placed in this  encounter.   Follow-Up Instructions: No follow-ups on file.   Collier Salina, MD  Note - This record has been created using [employer].  Chart creation errors have been sought, but may not always  have been located. Such creation errors do not reflect on  the standard of medical care.

## 2020-12-23 ENCOUNTER — Ambulatory Visit (INDEPENDENT_AMBULATORY_CARE_PROVIDER_SITE_OTHER): Payer: Medicare Other | Admitting: Internal Medicine

## 2020-12-23 ENCOUNTER — Encounter: Payer: Self-pay | Admitting: Internal Medicine

## 2020-12-23 ENCOUNTER — Other Ambulatory Visit: Payer: Self-pay

## 2020-12-23 VITALS — BP 166/80 | HR 90 | Ht 64.0 in | Wt 192.0 lb

## 2020-12-23 DIAGNOSIS — M25511 Pain in right shoulder: Secondary | ICD-10-CM

## 2020-12-23 DIAGNOSIS — R768 Other specified abnormal immunological findings in serum: Secondary | ICD-10-CM | POA: Diagnosis not present

## 2020-12-23 DIAGNOSIS — S83252D Bucket-handle tear of lateral meniscus, current injury, left knee, subsequent encounter: Secondary | ICD-10-CM

## 2020-12-23 DIAGNOSIS — G8929 Other chronic pain: Secondary | ICD-10-CM | POA: Diagnosis not present

## 2020-12-23 NOTE — Patient Instructions (Signed)
Anticyclic-Citrullinated Peptide Antibody Test Why am I having this test? You may have the anticyclic-citrullinated peptide antibody test done to help:  Diagnose rheumatoid arthritis (RA). RA is a long-term (chronic) disease that causes inflammation in the joints.  Determine the severity of your RA, including how much worse it is getting (progression). This test may be done if you have unexplained joint inflammation and have previously tested negative for rheumatoid factor. It may also be done if you have been diagnosed with undifferentiated arthritis and your health care provider suspects rheumatoid arthritis. What is being tested? This test checks your blood for the presence of anticyclic-citrullinated peptide antibodies. Antibodies are cells that are part of the body's disease-fighting (immune) system. These antibodies appear early in the course of RA and are thought to be directly involved in the progression of the disease. What kind of sample is taken? A blood sample is required for this test. It is usually collected by inserting a needle into a blood vessel.   How are the results reported? Your test results will be reported as either positive or negative. A result is considered negative if there is less than 20 units of the antibody per mL of blood. What do the results mean?  A positive blood test may mean that you have RA.  A negative blood test means that it is less likely that you have RA. However, a negative test does not completely rule out rheumatoid arthritis. Talk with your health care provider about what your results mean. Questions to ask your health care provider Ask your health care provider, or the department that is doing the test:  When will my results be ready?  How will I get my results?  What are my treatment options?  What other tests do I need?  What are my next steps? Summary  The anticyclic-citrullinated peptide antibody blood test may be done to help  your health care provider diagnose rheumatoid arthritis (RA).  This test checks your blood for the presence of anticyclic-citrullinated peptide antibodies. These antibodies appear early in the course of RA.  A positive blood test may mean that you have RA.    Erythrocyte Sedimentation Rate Test Why am I having this test? The erythrocyte sedimentation rate (ESR) test is used to help find illnesses related to:  Sudden (acute) or long-term (chronic) infections.  Inflammation.  The body's disease-fighting system attacking healthy cells (autoimmune diseases).  Cancer.  Tissue death. If you have symptoms that may be related to any of these illnesses, your health care provider may do an ESR test before doing more specific tests. If you have an inflammatory immune disease, such as rheumatoid arthritis, you may have this test to help monitor your therapy. What is being tested? This test measures how long it takes for your red blood cells (erythrocytes) to settle in a solution over a certain amount of time (sedimentation rate). When you have an infection or inflammation, your red blood cells clump together and settle faster. The sedimentation rate provides information about how much inflammation is present in the body. What kind of sample is taken? A blood sample is required for this test. It is usually collected by inserting a needle into a blood vessel.   How do I prepare for this test? Follow any instructions from your health care provider about changing or stopping your regular medicines. Tell a health care provider about:  Any allergies you have.  All medicines you are taking, including vitamins, herbs, eye drops, creams,  and over-the-counter medicines.  Any blood disorders you have.  Any surgeries you have had.  Any medical conditions you have, such as thyroid or kidney disease.  Whether you are pregnant or may be pregnant. How are the results reported? Your results will be  reported as a value that measures sedimentation rate in millimeters per hour (mm/hr). Your health care provider will compare your results to normal ranges that were established after testing a large group of people (reference values). Reference values may vary among labs and hospitals. For this test, common reference values, which vary by age and gender, are:  Newborn: 0-2 mm/hr.  Child, up to puberty: 0-10 mm/hr.  Female: ? Under 50 years: 0-20 mm/hr. ? 50-85 years: 0-30 mm/hr. ? Over 85 years: 0-42 mm/hr.  Female: ? Under 50 years: 0-15 mm/hr. ? 50-85 years: 0-20 mm/hr. ? Over 85 years: 0-30 mm/hr. Certain conditions or medicines may cause ESR levels to be falsely lower or higher, such as:  Pregnancy.  Obesity.  Steroids, birth control pills, and blood thinners.  Thyroid or kidney disease. What do the results mean? Results that are within reference values are considered normal, meaning that the level of inflammation in your body is healthy. High ESR levels mean that there is inflammation in your body. You will have more tests to help make a diagnosis. Inflammation may result from many different conditions or injuries. Talk with your health care provider about what your results mean. Questions to ask your health care provider Ask your health care provider, or the department that is doing the test:  When will my results be ready?  How will I get my results?  What are my treatment options?  What other tests do I need?  What are my next steps? Summary  The erythrocyte sedimentation rate (ESR) test is used to help find illnesses associated with sudden (acute) or long-term (chronic) infections, inflammation, autoimmune diseases, cancer, or tissue death.  If you have symptoms that may be related to any of these illnesses, your health care provider may do an ESR test before doing more specific tests. If you have an inflammatory immune disease, such as rheumatoid arthritis, you may  have this test to help monitor your therapy.  This test measures how long it takes for your red blood cells (erythrocytes) to settle in a solution over a certain amount of time (sedimentation rate). This provides information about how much inflammation is present in the body. This information is not intended to replace advice given to you by your health care provider. Make sure you discuss any questions you have with your health care provider. Document Revised: 07/12/2020 Document Reviewed: 07/12/2020 Elsevier Patient Education  [place].

## 2020-12-24 LAB — CYCLIC CITRUL PEPTIDE ANTIBODY, IGG: Cyclic Citrullin Peptide Ab: >250 UNITS — ABNORMAL HIGH

## 2020-12-24 LAB — SEDIMENTATION RATE: Sed Rate: 29 mm/h (ref 0–30)

## 2020-12-24 NOTE — Progress Notes (Signed)
I spoke with the patient already but needs f/u scheduled:  Lab tests show normal ESR combined with her exam does not look like RA inflammation is causing current joint problems. Xrays showed only OA changes and no erosive disease to indicate RA damage. Based on this do not feel there would be enough benefit to starting DMARDs currently. She has highly positive CCP Abs so is at risk for developing more inflammation. I recommend f/u in 1 year or she can call us sooner if develops worsening joint pain or swelling in other sites.

## 2020-12-25 DIAGNOSIS — M25562 Pain in left knee: Secondary | ICD-10-CM | POA: Diagnosis not present

## 2020-12-27 DIAGNOSIS — M25511 Pain in right shoulder: Secondary | ICD-10-CM | POA: Diagnosis not present

## 2020-12-30 DIAGNOSIS — M25511 Pain in right shoulder: Secondary | ICD-10-CM | POA: Diagnosis not present

## 2021-01-03 DIAGNOSIS — M25511 Pain in right shoulder: Secondary | ICD-10-CM | POA: Diagnosis not present

## 2021-01-08 DIAGNOSIS — M25552 Pain in left hip: Secondary | ICD-10-CM | POA: Diagnosis not present

## 2021-01-09 DIAGNOSIS — M25552 Pain in left hip: Secondary | ICD-10-CM | POA: Diagnosis not present

## 2021-01-13 DIAGNOSIS — M25511 Pain in right shoulder: Secondary | ICD-10-CM | POA: Diagnosis not present

## 2021-01-14 ENCOUNTER — Encounter: Payer: Self-pay | Admitting: Family Medicine

## 2021-01-14 DIAGNOSIS — R921 Mammographic calcification found on diagnostic imaging of breast: Secondary | ICD-10-CM | POA: Diagnosis not present

## 2021-01-20 DIAGNOSIS — M1712 Unilateral primary osteoarthritis, left knee: Secondary | ICD-10-CM | POA: Diagnosis not present

## 2021-01-26 ENCOUNTER — Other Ambulatory Visit: Payer: Self-pay | Admitting: Physician Assistant

## 2021-01-27 ENCOUNTER — Other Ambulatory Visit: Payer: Self-pay

## 2021-01-27 ENCOUNTER — Other Ambulatory Visit: Payer: Self-pay | Admitting: Physician Assistant

## 2021-01-27 DIAGNOSIS — M1712 Unilateral primary osteoarthritis, left knee: Secondary | ICD-10-CM | POA: Diagnosis not present

## 2021-01-27 MED ORDER — IRBESARTAN 300 MG PO TABS: 300.0000 mg | ORAL_TABLET | Freq: Every day | ORAL | 0 refills | Status: DC

## 2021-01-27 NOTE — Telephone Encounter (Signed)
Patient called in to schedule yearly appt for 3/10 w. Sharolyn Douglas. Patient is out of medication and needs a refill

## 2021-01-29 ENCOUNTER — Ambulatory Visit: Payer: Medicare Other | Admitting: Endocrinology

## 2021-01-29 ENCOUNTER — Other Ambulatory Visit: Payer: Self-pay | Admitting: Physician Assistant

## 2021-01-29 NOTE — Telephone Encounter (Signed)
Please review for refill at time of appointment on 01/30/21. Thanks!

## 2021-01-30 ENCOUNTER — Ambulatory Visit (INDEPENDENT_AMBULATORY_CARE_PROVIDER_SITE_OTHER): Payer: Medicare Other | Admitting: Nurse Practitioner

## 2021-01-30 ENCOUNTER — Encounter: Payer: Self-pay | Admitting: Nurse Practitioner

## 2021-01-30 ENCOUNTER — Other Ambulatory Visit: Payer: Self-pay

## 2021-01-30 VITALS — BP 140/70 | HR 77 | Ht 64.5 in | Wt 190.0 lb

## 2021-01-30 DIAGNOSIS — I77810 Thoracic aortic ectasia: Secondary | ICD-10-CM | POA: Diagnosis not present

## 2021-01-30 DIAGNOSIS — I351 Nonrheumatic aortic (valve) insufficiency: Secondary | ICD-10-CM

## 2021-01-30 DIAGNOSIS — I10 Essential (primary) hypertension: Secondary | ICD-10-CM | POA: Diagnosis not present

## 2021-01-30 DIAGNOSIS — E782 Mixed hyperlipidemia: Secondary | ICD-10-CM | POA: Diagnosis not present

## 2021-01-30 MED ORDER — AMLODIPINE BESYLATE 2.5 MG PO TABS: 2.5000 mg | ORAL_TABLET | Freq: Every day | ORAL | 3 refills | Status: DC

## 2021-01-30 NOTE — Patient Instructions (Addendum)
Medication Instructions:  Your physician has recommended you make the following change in your medication:   1. RESTART Amlodipine 2.5 mg once daily  2. STOP Chlorthalidone   *If you need a refill on your cardiac medications before your next appointment, please call your pharmacy*   Lab Work: None  If you have labs (blood work) drawn today and your tests are completely normal, you will receive your results only by: [name] Kitchen MyChart Message (if you have MyChart) OR . A paper copy in the mail If you have any lab test that is abnormal or we need to change your treatment, we will call you to review the results.   Testing/Procedures: None    Follow-Up: At [facility], you and your health needs are our priority.  As part of our continuing mission to provide you with exceptional heart care, we have created designated Provider Care Teams.  These Care Teams include your primary Cardiologist (physician) and Advanced Practice Providers (APPs -  Physician Assistants and Nurse Practitioners) who all work together to provide you with the care you need, when you need it.   Your next appointment:   6 month(s)  The format for your next appointment:   In Person  Provider:   Kathlyn Sacramento, MD or Murray Hodgkins, NP   Other Instructions Please monitor blood pressures and keep a log of your readings. Make sure to check 2 hours after your medications.   AVOID these things for 30 minutes before checking your blood pressure:  No Drinking caffeine.  No Drinking alcohol.  No Eating.  No Smoking.  No Exercising. Five minutes before checking your blood pressure:  Pee.  Sit in a dining chair. Avoid sitting in a soft couch or armchair.  Be quiet. Do not talk.

## 2021-01-30 NOTE — Progress Notes (Signed)
Office Visit    Patient Name: Tammy Manning Date of Encounter: 01/30/2021  Primary Care Provider:  Abner Greenspan, MD Primary Cardiologist:  Kathlyn Sacramento, MD  Chief Complaint    Tammy Manning is a 86 y/o female with history of aortic valve insufficiency, HTN w/intoleratnce to multiple antihypertensives, hyperlipidemia, normal coronary arteries by cath 2018, G1DD, mild aortic root dilatation @ 39 mm, B12 deficiency, osteoarthritis who presents today for her annual follow-up.   Past Medical History    Past Medical History:  Diagnosis Date  . Anginal pain ([place])   . Aortic insufficiency    a. 02/2020 Echo: EF 60-65%, no rwma, Gr1 DD, nl RV fxn, mildly dil LA, triv AI, Asc Ao 75m.  . Arthritis    Knees  . B12 deficiency    Mild  . Bucket-handle tear of lateral meniscus of left knee as current injury 12/30/2012  . Cancer (HCC)    Skin, basal cell on nose and eyelid  . DJD (degenerative joint disease)    Low back  . Dyspepsia   . GERD (gastroesophageal reflux disease)   . Heart murmur   . Heart palpitations   . History of cardiac cath    a. 02/2017 Cath: Nl cors. Nl LV fxn. Abd Aogram w/o RAS.  .[name] KitchenHOH (hard of hearing)   . Hyperlipidemia   . Hypertension   . Lactose intolerance   . Mixed incontinence   . PONV (postoperative nausea and vomiting)   . Vulvar irritation    from urine pad, uses Triamcinolone oint.   Past Surgical History:  Procedure Laterality Date  . ABDOMINAL AORTOGRAM N/A 03/15/2017   Procedure: Abdominal Aortogram;  Surgeon: MWellington Hampshire MD;  [facility];  Service: Cardiovascular;  Laterality: N/A;  . ABDOMINAL HYSTERECTOMY  1982   Large fibroids and bleeding  . APPENDECTOMY    . BILATERAL SALPINGOOPHORECTOMY  1986  . BONE GRAFT HIP ILIAC CREST     To Left arm  . BREAST REDUCTION SURGERY    . BREAST SURGERY    . CARDIAC CATHETERIZATION    . CATARACT EXTRACTION W/PHACO Left 07/07/2016   Procedure: CATARACT EXTRACTION PHACO AND  INTRAOCULAR LENS PLACEMENT (IOC);  Surgeon: WBirder Robson MD;  Location: [facility];  Service: Ophthalmology;  Laterality: Left;  [place]:10 AP% 18.3 CDE 12.91 Fluid pak lot # [id number]  . CATARACT EXTRACTION W/PHACO Right 07/28/2016   Procedure: CATARACT EXTRACTION PHACO AND INTRAOCULAR LENS PLACEMENT ([place];  Surgeon: WBirder Robson MD;  Location: [facility];  Service: Ophthalmology;  Laterality: Right;  [place]01:26 AP% 18.5 CDE 16.12 Fluid pack lot # [id number]  . COLONOSCOPY  07/2016   Dr. MThana Farr . DILATION AND CURETTAGE OF UTERUS     miscarragesx2  . ESOPHAGOGASTRODUODENOSCOPY     Polyps  . [place] 2006  . KNEE ARTHROSCOPY WITH LATERAL MENISECTOMY Left 12/30/2012   Procedure: KNEE ARTHROSCOPY WITH LATERAL MENISECTOMY;  Surgeon: JJohnny Bridge MD;  [facility]  Service: Orthopedics;  Laterality: Left;  LEFT KNEE SCOPE LATERAL MENISCECTOMY  . LEFT HEART CATH AND CORONARY ANGIOGRAPHY N/A 03/15/2017   Procedure: Left Heart Cath and Coronary Angiography;  Surgeon: MWellington Hampshire MD;  [facility];  Service: Cardiovascular;  Laterality: N/A;  . MOUTH SURGERY    . skin cancer eyelid  2012  . TONSILLECTOMY      Allergies  Allergies  Allergen Reactions  . Antihistamines, Chlorpheniramine-Type Other (See Comments)  Reaction:  Makes pt hyper   . Atenolol Hypertension  . Cefuroxime Axetil Other (See Comments)    Upset stomach  . Chlorthalidone Other (See Comments)  . Ciprofloxacin Other (See Comments)    Upset stomach  . Co Q 10 [Coenzyme Q10] Other (See Comments)    Reaction:  GI upset   . Crestor [Rosuvastatin Calcium] Other (See Comments)    Reaction:  Myalgias   . Dextromethorphan-Guaifenesin Other (See Comments)    Reaction:  Made pt jittery   . Epinephrine Hives  . Other     Reaction:  Makes pt hyper   . Ramipril Other (See Comments)    Upset stomach  . Sulfamethoxazole-Trimethoprim Other (See Comments)    Upset  stomach  . Vitamin D Analogs Other (See Comments)    Reaction:  GI upset     History of Present Illness    Tammy Manning is an 86 y/o female with history of aortic valve insufficiency, HTN w/intoleratnce to multiple antihypertensives, hyperlipidemia, normal coronary arteries by cath 2018, G1DD, mild aortic root dilatation @ 39 mm, B12 deficiency, osteoarthritis who presents today for her annual follow-up. Her history is significant for cardiac cath in 2018 which showed normal coronaries and normal LVEF. Her most recent echo was April 2021 which showed normal LVEF, trivial aortic valve insufficiency without stenosis, and mildly dilated aortic root at 39 mm. She has had multiple drug intolerances. At her last visit, she reported significant leg cramping and her chlorthalidone was reduced to 1 tab every 3 days and she was continued on irbesartan 300 mg and low dose atorvastatin and advised to follow up in 1 year.  Today, she reports that she is doing well except for recent shoulder injury requiring 8 weeks of PT and bilateral leg discomfort on the days that she takes chlorthalidone. She is currently taking chlorthalidone 12.5 mg every 2 days. She has taken it today and her BP is 140/76 in the left arm and 140/70 in the right arm. She states the discomfort occurs in her quads as soon as she starts walking but only on the days she takes chlorthalidone. She walks every day for 20-30 minutes covering the area of a large apartment complex - reported to be less than 1 mile. She denies chest discomfort, SOB, or palpitations with exertion. She denies cramping or pain in her legs - describes it more as a heaviness. She occasionally feels a fluttering in her chest which she relieves with taking a deep breath or blowing on her thumb, and is generally short lived.  Her BP at home is similar to readings in office today.   Home Medications    Prior to Admission medications   Medication Sig Start Date End Date Taking?  Authorizing Provider  acetaminophen (TYLENOL) 500 MG tablet Take 500 mg by mouth every 6 (six) hours as needed.    [provider]  aspirin EC 81 MG tablet Take 1 tablet (81 mg total) by mouth daily. Patient not taking: Reported on 12/23/2020 05/10/18   Gladstone Lighter, MD  atorvastatin (LIPITOR) 10 MG tablet TAKE 1 TABLET BY MOUTH EVERY DAY AT 6 PM 05/13/20   [name], [name], MD  Calcium Carbonate-Vitamin D (CALCIUM 600+D PO) Take 1 tablet by mouth daily.     [provider]  chlorthalidone (HYGROTON) 25 MG tablet Take 12.5 mg by mouth daily. 1/2 pill by mouth every 2 days    [provider]  clobetasol ointment (TEMOVATE) 3.73 % Apply 1 application  topically 2 (two) times daily. To affected area Patient taking differently: Apply 1 application topically as needed. To affected area 02/21/20   [name], [name], MD  clorazepate (TRANXENE) 7.5 MG tablet Take 1 tablet (7.5 mg total) by mouth daily as needed. Patient not taking: No sig reported 10/01/20   [name], [name], MD  cyanocobalamin (,VITAMIN B-12,) 1000 MCG/ML injection Inject 1,000 mcg into the muscle every 30 (thirty) days.  12/16/16   [provider]  irbesartan (AVAPRO) 300 MG tablet TAKE 1 TABLET BY MOUTH DAILY(PLEASE KEEP UPCOMING APPOINTMENT) 01/29/21   Theora Gianotti, NP  meloxicam (MOBIC) 15 MG tablet Take 1 tablet (15 mg total) by mouth daily as needed for pain. With food 12/06/20   [name], [name], MD  methocarbamol (ROBAXIN) 500 MG tablet Take 1 tablet (500 mg total) by mouth 3 (three) times daily as needed for muscle spasms (back pain). Caution of sedation Patient not taking: No sig reported 03/15/20   [name], Roque Lias A, MD  omeprazole (PRILOSEC) 20 MG capsule TAKE 1 CAPSULE(20 MG) BY MOUTH BID PRN 03/15/20   [name], [name], MD  triamcinolone (NASACORT) 55 MCG/ACT AERO nasal inhaler Place 2 sprays into the nose daily. Patient not taking: Reported on 12/23/2020 02/22/19   Abner Greenspan, MD    Review  of Systems    Denies chest discomfort, dyspnea, edema, n/v/d, dizziness, lightheadedness, syncope, early satiety. Has occasional palpitations that she relieves with breathing.  All other systems reviewed and are otherwise negative except as noted above.  Physical Exam    VS:  BP 140/70 (BP Location: Right Arm, Patient Position: Sitting, Cuff Size: Normal)   Pulse 77   Ht 5' 4.5" (1.638 m)   Wt 190 lb (86.2 kg)   SpO2 97%   BMI 32.11 kg/m  GEN: Obese, well nourished, in no acute distress. HEENT: normal. Neck: Supple, no JVD, carotid bruits, or masses. Cardiac: RRR, no murmurs, rubs, or gallops. No clubbing, cyanosis, edema.  Radials/PT 2+ and equal bilaterally.  Respiratory:  Respirations regular and unlabored, clear to auscultation bilaterally. GI: Soft, nontender, nondistended, BS + x 4. MS: no deformity or atrophy. Skin: warm and dry, no rash. Neuro:  Strength and sensation are intact. Psych: Normal affect.  Accessory Clinical Findings    ECG personally reviewed by me today - RSR @ 65, leftward axis, LVH - no acute changes.  Lab Results  Component Value Date   WBC 8.4 10/16/2020   HGB 12.6 10/16/2020   HCT 38.2 10/16/2020   MCV 91.8 10/16/2020   PLT 363.0 10/16/2020   Lab Results  Component Value Date   CREATININE 0.81 04/25/2020   BUN 18 04/25/2020   NA 138 04/25/2020   K 4.0 04/25/2020   CL 104 04/25/2020   CO2 25 04/25/2020   Lab Results  Component Value Date   ALT 10 04/25/2020   AST 13 04/25/2020   ALKPHOS 61 04/25/2020   BILITOT 0.4 04/25/2020   Lab Results  Component Value Date   CHOL 201 (H) 04/25/2020   HDL 66.80 04/25/2020   LDLCALC 107 (H) 04/25/2020   LDLDIRECT 106 (H) 02/02/2020   TRIG 132.0 04/25/2020   CHOLHDL 3 04/25/2020    Lab Results  Component Value Date   HGBA1C 5.7 (H) 05/10/2018    Assessment & Plan    1.  Essential Hypertension: BP is elevated at 140/70. She reports similar readings at home. She is tolerating highest dose  irbesartan without difficulty. She takes chlorthalidone  12.5 every 2 days and reports that her legs feel heavy on those days.  She has noted this pattern over at least the past year. She does not experience heaviness in her legs on the days that she does not take chlorthalidone.  She has multiple antihypertensive agents listed as allergies but admits that when she started taking medication, she was very nervous because she had never taken medication before. With her continued symptoms of leg discomfort, we will d/c chlorthalidone. She is willing to try low dose amlodipine 2.5 mg again (prior report that this elevated bp, but she isn't so sure about that). She will monitor symptoms and BP at home and will call or send MyChart message in 2 weeks to report.   2. Aortic valve insufficiency: Last echo April 2021 shows trivial aortic insufficiency without stenosis. She is asymptomatic. No murmur appreciated on exam today. We will continue to follow with echo in about a year.   3. Hyperlipidemia: Last LDL 107 in June 2021. She did not tolerate higher dose statins. Tolerating atorvastatin 10 mg. In the setting of no CAD, we will continue to follow.  4. Dilated Ao root:  66m last year.  F/u echo next year.  5.  Disposition: D/c chlorthalidone. Start amlodipine 2.5 daily. Have asked her to send BP readings in 2 weeks. Follow-up in 6 months with Dr. AFletcher Anonor APP.    CMurray Hodgkins NP 01/30/2021, 5:07 PM

## 2021-02-03 DIAGNOSIS — M1712 Unilateral primary osteoarthritis, left knee: Secondary | ICD-10-CM | POA: Diagnosis not present

## 2021-02-06 ENCOUNTER — Other Ambulatory Visit: Payer: Self-pay

## 2021-02-06 ENCOUNTER — Encounter: Payer: Self-pay | Admitting: Family Medicine

## 2021-02-06 ENCOUNTER — Ambulatory Visit (INDEPENDENT_AMBULATORY_CARE_PROVIDER_SITE_OTHER): Payer: Medicare Other | Admitting: Family Medicine

## 2021-02-06 VITALS — BP 150/80 | HR 69 | Temp 97.3°F | Ht 64.5 in | Wt 192.0 lb

## 2021-02-06 DIAGNOSIS — E538 Deficiency of other specified B group vitamins: Secondary | ICD-10-CM

## 2021-02-06 DIAGNOSIS — M2540 Effusion, unspecified joint: Secondary | ICD-10-CM

## 2021-02-06 LAB — BASIC METABOLIC PANEL
BUN: 17 mg/dL (ref 6–23)
CO2: 27 mEq/L (ref 19–32)
Calcium: 9.8 mg/dL (ref 8.4–10.5)
Chloride: 104 mEq/L (ref 96–112)
Creatinine, Ser: 0.9 mg/dL (ref 0.40–1.20)
GFR: 58.97 mL/min — ABNORMAL LOW (ref >60.00)
Glucose, Bld: 87 mg/dL (ref 70–99)
Potassium: 4.5 mEq/L (ref 3.5–5.1)
Sodium: 139 mEq/L (ref 135–145)

## 2021-02-06 LAB — SEDIMENTATION RATE: Sed Rate: 24 mm/hr (ref 0–30)

## 2021-02-06 LAB — VITAMIN B12: Vitamin B-12: 336 pg/mL (ref 211–911)

## 2021-02-06 MED ORDER — CHLORTHALIDONE 25 MG PO TABS: ORAL_TABLET | ORAL | Status: DC

## 2021-02-06 MED ORDER — CYANOCOBALAMIN 1000 MCG/ML IJ SOLN
1000.0000 ug | Freq: Once | INTRAMUSCULAR | Status: AC
Start: 2021-02-06 — End: 2021-02-06
  Administered 2021-02-06: 1000 ug via INTRAMUSCULAR

## 2021-02-06 NOTE — Progress Notes (Signed)
This visit occurred during the SARS-CoV-2 public health emergency.  Safety protocols were in place, including screening questions prior to the visit, additional usage of staff PPE, and extensive cleaning of exam room while observing appropriate contact time as indicated for disinfecting solutions.  Joint pain and fatigue.  She had trouble closing her hands episodically.  Had to soak hands in hot water to improve ROM.  She had prev swelling and redness on the MCPs.  No redness or swelling now.  Sx are episodic.  She is improved currently.  Most recent "bad day" was the end of last week, with a few days in a row.    She had prev steroid injection about 1 month ago, unclear if that affected her prev labs.   Med list updated re: BP meds, taking chlorthalidone and avapro currently.    Due for B12 shot.  No fevers, chills.  She is already on meloxicam daily.  B12 shot done at office visit.  Meds, vitals, and allergies reviewed.   ROS: Per HPI unless specifically indicated in ROS section   nad ncat Neck supple, no LA rrr ctab abd soft, not ttp Ext well perfused.  She has symmetric hand exam.  No active synovitis.  No erythema or bruising.  Normal range of motion and neurovascularly intact.

## 2021-02-06 NOTE — Patient Instructions (Signed)
Stop the lipitor for 1 week.  If you feel better, let us know.  If not, then restart.   Go to the lab on the way out.   If you have mychart we'll likely use that to update you.     B12 shot after labs.    Take care.  Glad to see you.

## 2021-02-07 ENCOUNTER — Other Ambulatory Visit: Payer: Self-pay | Admitting: Family Medicine

## 2021-02-07 LAB — RHEUMATOID FACTOR: Rheumatoid fact SerPl-aCnc: 76 IU/mL — ABNORMAL HIGH (ref <14)

## 2021-02-07 NOTE — Telephone Encounter (Signed)
[facility] left v/m that omeprazole has 2 sets of instructions needs cb to verify how pt should be taking med. Sending note to [place].

## 2021-02-09 ENCOUNTER — Other Ambulatory Visit: Payer: Self-pay | Admitting: Family Medicine

## 2021-02-09 MED ORDER — PREDNISONE 10 MG PO TABS: ORAL_TABLET | ORAL | 0 refills | Status: DC

## 2021-02-09 NOTE — Assessment & Plan Note (Signed)
B12 shot done at office visit today.

## 2021-02-09 NOTE — Assessment & Plan Note (Signed)
History of.  We agreed to recheck her labs today.  She is off steroids other than a steroid injection about 1 month ago.  She wanted to avoid tramadol or other opiates.  Discussed options.  See notes on labs.

## 2021-02-11 DIAGNOSIS — M25552 Pain in left hip: Secondary | ICD-10-CM | POA: Diagnosis not present

## 2021-02-11 DIAGNOSIS — M25511 Pain in right shoulder: Secondary | ICD-10-CM | POA: Diagnosis not present

## 2021-02-12 DIAGNOSIS — M25511 Pain in right shoulder: Secondary | ICD-10-CM | POA: Diagnosis not present

## 2021-02-13 MED ORDER — OMEPRAZOLE 20 MG PO CPDR: 20.0000 mg | DELAYED_RELEASE_CAPSULE | Freq: Two times a day (BID) | ORAL | 0 refills | Status: DC | PRN

## 2021-02-13 NOTE — Addendum Note (Signed)
Addended by: Tammi Sou on: 02/13/2021 08:53 AM   Modules accepted: Orders

## 2021-02-13 NOTE — Telephone Encounter (Signed)
Ivin Booty from [facility] called again to clarify her directions on the omeprazole. It says take bid prn. Then it says take 1 as needed. Please advise pharmacy.

## 2021-02-13 NOTE — Telephone Encounter (Signed)
Rx sent with corrected instructions

## 2021-02-18 ENCOUNTER — Other Ambulatory Visit: Payer: Self-pay

## 2021-02-18 ENCOUNTER — Other Ambulatory Visit: Payer: Self-pay | Admitting: Family Medicine

## 2021-02-18 ENCOUNTER — Ambulatory Visit (INDEPENDENT_AMBULATORY_CARE_PROVIDER_SITE_OTHER): Payer: Medicare Other | Admitting: Endocrinology

## 2021-02-18 ENCOUNTER — Encounter: Payer: Self-pay | Admitting: Family Medicine

## 2021-02-18 VITALS — BP 140/60 | HR 83 | Ht 64.0 in | Wt 188.6 lb

## 2021-02-18 DIAGNOSIS — E041 Nontoxic single thyroid nodule: Secondary | ICD-10-CM

## 2021-02-18 MED ORDER — IRBESARTAN 300 MG PO TABS: ORAL_TABLET | ORAL | 3 refills | Status: DC

## 2021-02-18 MED ORDER — CLORAZEPATE DIPOTASSIUM 7.5 MG PO TABS: 7.5000 mg | ORAL_TABLET | Freq: Every day | ORAL | 1 refills | Status: DC | PRN

## 2021-02-18 NOTE — Patient Instructions (Addendum)
We can skip the ultrasound this year.   please come back for a follow-up appointment in 1 year.

## 2021-02-18 NOTE — Progress Notes (Signed)
Subjective:    Patient ID: Tammy Manning, female    DOB: 19-Jun-1935, 86 y.o.   MRN: [id number]  HPI Pt returns for f/u of MNG (dx'ed 2019; [place] then showed 3.1 cm right inferior nodule meets criteria for biopsy; US-guided bxs showed (CATEGORY I), and (later) BENIGN FOLLICULAR NODULE (CATEGORY II); TSH is mid-normal off any rx; f/u US in 2021 shows annual f/u needed).  She does not notice the nodule.  pt states she feels well in general, except for arthralgias.   Past Medical History:  Diagnosis Date  . Anginal pain ([place])   . Aortic insufficiency    a. 02/2020 Echo: EF 60-65%, no rwma, Gr1 DD, nl RV fxn, mildly dil LA, triv AI, Asc Ao 3m.  . Arthritis    Knees  . B12 deficiency    Mild  . Bucket-handle tear of lateral meniscus of left knee as current injury 12/30/2012  . Cancer (HCC)    Skin, basal cell on nose and eyelid  . DJD (degenerative joint disease)    Low back  . Dyspepsia   . GERD (gastroesophageal reflux disease)   . Heart murmur   . Heart palpitations   . History of cardiac cath    a. 02/2017 Cath: Nl cors. Nl LV fxn. Abd Aogram w/o RAS.  .[name] KitchenHOH (hard of hearing)   . Hyperlipidemia   . Hypertension   . Lactose intolerance   . Mixed incontinence   . PONV (postoperative nausea and vomiting)   . Vulvar irritation    from urine pad, uses Triamcinolone oint.    Past Surgical History:  Procedure Laterality Date  . ABDOMINAL AORTOGRAM N/A 03/15/2017   Procedure: Abdominal Aortogram;  Surgeon: MWellington Hampshire MD;  [facility];  Service: Cardiovascular;  Laterality: N/A;  . ABDOMINAL HYSTERECTOMY  1982   Large fibroids and bleeding  . APPENDECTOMY    . BILATERAL SALPINGOOPHORECTOMY  1986  . BONE GRAFT HIP ILIAC CREST     To Left arm  . BREAST REDUCTION SURGERY    . BREAST SURGERY    . CARDIAC CATHETERIZATION    . CATARACT EXTRACTION W/PHACO Left 07/07/2016   Procedure: CATARACT EXTRACTION PHACO AND INTRAOCULAR LENS PLACEMENT (IOC);  Surgeon:  WBirder Robson MD;  Location: [facility];  Service: Ophthalmology;  Laterality: Left;  [place]01:10 AP% 18.3 CDE 12.91 Fluid pak lot # [id number]  . CATARACT EXTRACTION W/PHACO Right 07/28/2016   Procedure: CATARACT EXTRACTION PHACO AND INTRAOCULAR LENS PLACEMENT ([facility];  Surgeon: WBirder Robson MD;  Location: [facility];  Service: Ophthalmology;  Laterality: Right;  [place]01:26 AP% 18.5 CDE 16.12 Fluid pack lot # [id number]  . COLONOSCOPY  07/2016   Dr. MThana Farr . DILATION AND CURETTAGE OF UTERUS     miscarragesx2  . ESOPHAGOGASTRODUODENOSCOPY     Polyps  . [place] 2006  . KNEE ARTHROSCOPY WITH LATERAL MENISECTOMY Left 12/30/2012   Procedure: KNEE ARTHROSCOPY WITH LATERAL MENISECTOMY;  Surgeon: JJohnny Bridge MD;  [facility]  Service: Orthopedics;  Laterality: Left;  LEFT KNEE SCOPE LATERAL MENISCECTOMY  . LEFT HEART CATH AND CORONARY ANGIOGRAPHY N/A 03/15/2017   Procedure: Left Heart Cath and Coronary Angiography;  Surgeon: MWellington Hampshire MD;  [facility];  Service: Cardiovascular;  Laterality: N/A;  . MOUTH SURGERY    . skin cancer eyelid  2012  . TONSILLECTOMY      Social History   Socioeconomic History  . Marital status:  Married    Spouse name: Not on file  . Number of children: 2  . Years of education: Not on file  . Highest education level: Not on file  Occupational History  . Occupation: Architect: RETIRED  Tobacco Use  . Smoking status: Former Smoker    Packs/day: 1.00    Years: 12.00    Pack years: 12.00    Types: Cigarettes    Quit date: 11/24/1963    Years since quitting: 57.2  . Smokeless tobacco: Never Used  Vaping Use  . Vaping Use: Never used  Substance and Sexual Activity  . Alcohol use: No    Alcohol/week: 0.0 standard drinks  . Drug use: No  . Sexual activity: Yes  Other Topics Concern  . Not on file  Social History Narrative   Metallurgist.     Social Determinants of Health    Financial Resource Strain: Low Risk   . Difficulty of Paying Living Expenses: Not hard at all  Food Insecurity: No Food Insecurity  . Worried About Charity fundraiser in the Last Year: Never true  . Ran Out of Food in the Last Year: Never true  Transportation Needs: No Transportation Needs  . Lack of Transportation (Medical): No  . Lack of Transportation (Non-Medical): No  Physical Activity: Sufficiently Active  . Days of Exercise per Week: 7 days  . Minutes of Exercise per Session: 120 min  Stress: No Stress Concern Present  . Feeling of Stress : Not at all  Social Connections: Not on file  Intimate Partner Violence: Not At Risk  . Fear of Current or Ex-Partner: No  . Emotionally Abused: No  . Physically Abused: No  . Sexually Abused: No    Current Outpatient Medications on File Prior to Visit  Medication Sig Dispense Refill  . acetaminophen (TYLENOL) 500 MG tablet Take 500 mg by mouth every 6 (six) hours as needed.    [name] Kitchen aspirin EC 81 MG tablet Take 81 mg by mouth every other day. Swallow whole.    [name] Kitchen atorvastatin (LIPITOR) 10 MG tablet TAKE 1 TABLET BY MOUTH EVERY DAY AT 6 PM 90 tablet 3  . Calcium Carbonate-Vitamin D (CALCIUM 600+D PO) Take 1 tablet by mouth daily.     . chlorthalidone (HYGROTON) 25 MG tablet 1/2 pill by mouth every 2 days    . clobetasol ointment (TEMOVATE) 9.47 % Apply 1 application topically 2 (two) times daily as needed.    . cyanocobalamin (,VITAMIN B-12,) 1000 MCG/ML injection Inject 1,000 mcg into the muscle every 30 (thirty) days.     . meloxicam (MOBIC) 15 MG tablet Take 1 tablet (15 mg total) by mouth daily as needed for pain. With food 90 tablet 0  . omeprazole (PRILOSEC) 20 MG capsule Take 1 capsule (20 mg total) by mouth 2 (two) times daily as needed. 180 capsule 0  . triamcinolone (NASACORT) 55 MCG/ACT AERO nasal inhaler Place 2 sprays into the nose daily. 1 Inhaler 12   No current facility-administered medications on file prior to visit.     Allergies  Allergen Reactions  . Antihistamines, Chlorpheniramine-Type Other (See Comments)    Reaction:  Makes pt hyper   . Atenolol Hypertension  . Cefuroxime Axetil Other (See Comments)    Upset stomach  . Chlorthalidone Other (See Comments)  . Ciprofloxacin Other (See Comments)    Upset stomach  . Co Q 10 [Coenzyme Q10] Other (See Comments)    Reaction:  GI  upset   . Crestor [Rosuvastatin Calcium] Other (See Comments)    Reaction:  Myalgias   . Dextromethorphan-Guaifenesin Other (See Comments)    Reaction:  Made pt jittery   . Epinephrine Hives  . Ramipril Other (See Comments)    Upset stomach  . Sulfamethoxazole-Trimethoprim Other (See Comments)    Upset stomach  . Vitamin D Analogs Other (See Comments)    Reaction:  GI upset     Family History  Problem Relation Age of Onset  . Cancer Mother        Colon  . Heart disease Father        CAD  . Diabetes Sister   . Hypothyroidism Sister   . Diabetes Brother   . Leukemia Brother   . Esophageal cancer Brother   . Cancer Paternal Aunt        Breast  . Diabetes Sister   . Alzheimer's disease Sister   . Diabetes Brother   . Breast cancer Daughter     BP 140/60 (BP Location: Right Arm, Patient Position: Sitting, Cuff Size: Large)   Pulse 83   Ht 5' 4"  (1.626 m)   Wt 188 lb 9.6 oz (85.5 kg)   SpO2 99%   BMI 32.37 kg/m    Review of Systems     Objective:   Physical Exam VITAL SIGNS:  See vs page GENERAL: no distress NECK: right thyroid nodule is again noted. No palpable LN's.     Lab Results  Component Value Date   TSH 1.33 04/25/2020   Lab Results  Component Value Date   CREATININE 0.90 02/06/2021   BUN 17 02/06/2021   NA 139 02/06/2021   K 4.5 02/06/2021   CL 104 02/06/2021   CO2 27 02/06/2021       Assessment & Plan:  Thyroid nodule: clinically stable.    Patient Instructions  We can skip the ultrasound this year.   please come back for a follow-up appointment in 1 year.

## 2021-02-18 NOTE — Telephone Encounter (Signed)
Name of Medication: Clorazepate Name of Pharmacy: [facility] or Written Date and Quantity:10/01/21 #30 / 0 refills  Last Office Visit and Type: Hand Pain (Dr. Damita Dunnings) on 02/06/21 Next Office Visit and Type: none scheduled

## 2021-03-11 ENCOUNTER — Telehealth: Payer: Self-pay

## 2021-03-11 ENCOUNTER — Telehealth (INDEPENDENT_AMBULATORY_CARE_PROVIDER_SITE_OTHER): Payer: Medicare Other | Admitting: Family Medicine

## 2021-03-11 DIAGNOSIS — R0981 Nasal congestion: Secondary | ICD-10-CM

## 2021-03-11 DIAGNOSIS — J309 Allergic rhinitis, unspecified: Secondary | ICD-10-CM | POA: Diagnosis not present

## 2021-03-11 DIAGNOSIS — R519 Headache, unspecified: Secondary | ICD-10-CM

## 2021-03-11 MED ORDER — BENZONATATE 100 MG PO CAPS: 100.0000 mg | ORAL_CAPSULE | Freq: Three times a day (TID) | ORAL | 0 refills | Status: DC | PRN

## 2021-03-11 MED ORDER — AMOXICILLIN-POT CLAVULANATE 875-125 MG PO TABS: 1.0000 | ORAL_TABLET | Freq: Two times a day (BID) | ORAL | 0 refills | Status: DC

## 2021-03-11 NOTE — Telephone Encounter (Signed)
[facility] [place] Night - Client Nonclinical Telephone Record [facility] Client [place] Night - Client Client Site [place] Physician Loura Pardon - MD Contact Type Call Who Is Calling Patient / Member / Family / Caregiver Caller Name [place] Phone Number [phone] Patient Name Tammy Manning Patient DOB 1935-03-19 Call Type Message Only Information Provided Reason for Call Request to Schedule Office Appointment Initial Comment Caller requesting return call to schedule an appointment. No triage Additional Comment Office hours provided. Disp. Time Disposition Final User 03/11/2021 7:18:55 AM General Information Provided Yes Jaclyn Prime Call Closed By: Jaclyn Prime Transaction Date/Time: 03/11/2021 7:16:50 AM (ET)

## 2021-03-11 NOTE — Patient Instructions (Signed)
  HOME CARE TIPS:  -[place] testing information: [email or web address] OR [phone] Most pharmacies also offer testing and home test kits. If COVID testing is positive, please make a follow-up video visit with your primary care office to discuss treatment options.  -I sent the medication(s) we discussed to your pharmacy: Meds ordered this encounter  Medications  . benzonatate (TESSALON PERLES) 100 MG capsule    Sig: Take 1 capsule (100 mg total) by mouth 3 (three) times daily as needed.    Dispense:  20 capsule    Refill:  0  . amoxicillin-clavulanate (AUGMENTIN) 875-125 MG tablet    Sig: Take 1 tablet by mouth 2 (two) times daily.    Dispense:  20 tablet    Refill:  0     -Nasal saline twice daily  -Continue your allergy spray  -can use tylenol f needed for fevers, aches and pains per instructions  -stay hydrated, drink plenty of fluids and eat small healthy meals - avoid dairy  -can take 1000 IU (63mg) Vit D3 and 100-500 mg of Vit C daily per instructions  -If the Covid test is positive, check out the CDC website for more information on home care, transmission and treatment for COVID19  -follow up with your doctor in 2-3 days unless improving and feeling better  -stay home while sick, except to seek medical care  It was nice to meet you today, and I really hope you are feeling better soon. I help [facility] out with telemedicine visits on Tuesdays and Thursdays and am available for visits on those days. If you have any concerns or questions following this visit please schedule a follow up visit with your Primary Care doctor or seek care at a local urgent care clinic to avoid delays in care.    Seek in person care or schedule a follow up video visit promptly if your symptoms worsen, new concerns arise or you are not improving with treatment. Call 911 and/or seek emergency care if your symptoms are severe or life  threatening.

## 2021-03-11 NOTE — Telephone Encounter (Signed)
Patient already scheduled appointment with Dr.Hannah Maudie Mercury today at 11:40.

## 2021-03-11 NOTE — Progress Notes (Signed)
Virtual Visit via Telephone Note  I connected with Tammy Manning on 03/11/21 at 11:20 AM EDT by telephone and verified that I am speaking with the correct person using two identifiers.   I discussed the limitations, risks, security and privacy concerns of performing an evaluation and management service by telephone and the availability of in person appointments. I also discussed with the patient that there may be a patient responsible charge related to this service. The patient expressed understanding and agreed to proceed.  Location patient: home, [place] Location provider: work or home office Participants present for the call: patient, provider Patient did not have a visit with me in the prior 7 days to address this/these issue(s).   History of Present Illness:  Acute telemedicine visit for sinus issues and cough: -Onset: chronic allergy issues in the spring, now much worse the last 6 days ago -Symptoms include: nasal congestion, pnd, cough, one side of nose is stopped, sinus pressure with some discomfort on one side of her face -no abx in the last few months -has been using nasocort throughout the spring -Denies: fevers, body aches, SOB, CP, NVD, known sick contacts, inability to eat/drink/get out of bed -Pertinent past medical history:htn, A. Fib, seasonal allergies, reports sinus infection about once per year and feels she has a sinus infection and needs abx -Pertinent medication allergies: sulfa, cipro, cefuroxime (upset stomach) and a number of other medications- however, she reports she can take penicillin class abx well -COVID-19 vaccine status: has had 3 doses of covid vaccine; had flu vaccine as well   Observations/Objective: Patient sounds cheerful and well on the phone. I do not appreciate any SOB. Speech and thought processing are grossly intact. Patient reported vitals:  Assessment and Plan:  Nasal sinus congestion  Facial discomfort  Allergic rhinitis, unspecified  seasonality, unspecified trigger  -we discussed possible serious and likely etiologies, options for evaluation and workup, limitations of telemedicine visit vs in person visit, treatment, treatment risks and precautions. Pt prefers to treat via telemedicine empirically rather than in person at this moment.  She has several things going on, first of all she has allergies and cannot tolerate antihistamines.  Advised continuation of her INS along with nasal saline twice daily.  She also may have an acute viral upper respiratory infection, COVID-19, sinusitis versus other.  Advised COVID testing and discussed options.  Advised if not improving with the nasal saline and allergy spray, she could take an antibiotic, however discussed risk.  She prefers this.  She also requested a cough medicine.  Sent Tessalon for cough.  Discussed potential complications and precautions. Scheduled follow up with PCP offered: Agrees to follow-up as needed. Advised to seek prompt in person care if worsening, new symptoms arise, or if is not improving with treatment. Advised of options for inperson care in case PCP office not available. Did let the patient know that I only do telemedicine shifts for [facility] on Tuesdays and Thursdays and advised a follow up visit with PCP or at an [facility] if has further questions or concerns.   Follow Up Instructions:  I did not refer this patient for an OV with me in the next 24 hours for this/these issue(s).  I discussed the assessment and treatment plan with the patient. The patient was provided an opportunity to ask questions and all were answered. The patient agreed with the plan and demonstrated an understanding of the instructions.   I spent 13 minutes on the date of this visit in the care  of this patient. See summary of tasks completed to properly care for this patient in the detailed notes above which also included counseling of above, review of PMH, medications, allergies, evaluation of the  patient and ordering and/or  instructing patient on testing and care options.     Lucretia Kern, DO

## 2021-03-20 ENCOUNTER — Emergency Department: Payer: Medicare Other

## 2021-03-20 ENCOUNTER — Encounter: Payer: Self-pay | Admitting: Family Medicine

## 2021-03-20 ENCOUNTER — Other Ambulatory Visit: Payer: Self-pay

## 2021-03-20 ENCOUNTER — Emergency Department
Admission: EM | Admit: 2021-03-20 | Discharge: 2021-03-20 | Disposition: A | Discharge status: Home / Self Care | Payer: Medicare Other | Attending: Emergency Medicine | Admitting: Emergency Medicine

## 2021-03-20 ENCOUNTER — Encounter: Payer: Self-pay | Admitting: Emergency Medicine

## 2021-03-20 ENCOUNTER — Telehealth: Payer: Self-pay

## 2021-03-20 DIAGNOSIS — Z20822 Contact with and (suspected) exposure to covid-19: Secondary | ICD-10-CM | POA: Insufficient documentation

## 2021-03-20 DIAGNOSIS — Z85828 Personal history of other malignant neoplasm of skin: Secondary | ICD-10-CM | POA: Diagnosis not present

## 2021-03-20 DIAGNOSIS — M79605 Pain in left leg: Secondary | ICD-10-CM | POA: Diagnosis not present

## 2021-03-20 DIAGNOSIS — M7989 Other specified soft tissue disorders: Secondary | ICD-10-CM | POA: Diagnosis not present

## 2021-03-20 DIAGNOSIS — Z87891 Personal history of nicotine dependence: Secondary | ICD-10-CM | POA: Diagnosis not present

## 2021-03-20 DIAGNOSIS — Z79899 Other long term (current) drug therapy: Secondary | ICD-10-CM | POA: Insufficient documentation

## 2021-03-20 DIAGNOSIS — R059 Cough, unspecified: Secondary | ICD-10-CM | POA: Insufficient documentation

## 2021-03-20 DIAGNOSIS — I10 Essential (primary) hypertension: Secondary | ICD-10-CM | POA: Insufficient documentation

## 2021-03-20 DIAGNOSIS — M79662 Pain in left lower leg: Secondary | ICD-10-CM | POA: Diagnosis not present

## 2021-03-20 DIAGNOSIS — Z7982 Long term (current) use of aspirin: Secondary | ICD-10-CM | POA: Insufficient documentation

## 2021-03-20 LAB — COMPREHENSIVE METABOLIC PANEL
ALT: 14 U/L (ref 0–44)
AST: 17 U/L (ref 15–41)
Albumin: 3.6 g/dL (ref 3.5–5.0)
Alkaline Phosphatase: 54 U/L (ref 38–126)
Anion gap: 7 (ref 5–15)
BUN: 14 mg/dL (ref 8–23)
CO2: 25 mmol/L (ref 22–32)
Calcium: 9.2 mg/dL (ref 8.9–10.3)
Chloride: 107 mmol/L (ref 98–111)
Creatinine, Ser: 0.86 mg/dL (ref 0.44–1.00)
GFR, Estimated: >60 mL/min (ref >60)
Glucose, Bld: 107 mg/dL — ABNORMAL HIGH (ref 70–99)
Potassium: 3.7 mmol/L (ref 3.5–5.1)
Sodium: 139 mmol/L (ref 135–145)
Total Bilirubin: 0.4 mg/dL (ref 0.3–1.2)
Total Protein: 6.5 g/dL (ref 6.5–8.1)

## 2021-03-20 LAB — CBC
HCT: 33.6 % — ABNORMAL LOW (ref 36.0–46.0)
Hemoglobin: 11 g/dL — ABNORMAL LOW (ref 12.0–15.0)
MCH: 31 pg (ref 26.0–34.0)
MCHC: 32.7 g/dL (ref 30.0–36.0)
MCV: 94.6 fL (ref 80.0–100.0)
Platelets: 445 10*3/uL — ABNORMAL HIGH (ref 150–400)
RBC: 3.55 MIL/uL — ABNORMAL LOW (ref 3.87–5.11)
RDW: 14 % (ref 11.5–15.5)
WBC: 8.9 10*3/uL (ref 4.0–10.5)
nRBC: 0 % (ref 0.0–0.2)

## 2021-03-20 LAB — BRAIN NATRIURETIC PEPTIDE: B Natriuretic Peptide: 72.4 pg/mL (ref 0.0–100.0)

## 2021-03-20 LAB — RESP PANEL BY RT-PCR (FLU A&B, COVID) ARPGX2
Influenza A by PCR: NEGATIVE
Influenza B by PCR: NEGATIVE
SARS Coronavirus 2 by RT PCR: NEGATIVE

## 2021-03-20 LAB — TROPONIN I (HIGH SENSITIVITY): Troponin I (High Sensitivity): 3 ng/L (ref <18)

## 2021-03-20 NOTE — Telephone Encounter (Signed)
Tammy Manning front office mgr brought me note that pt had scheduled an in office visit thru my chart on 03/24/21 with Dr Glori Bickers. I spoke with pt; pt finished abx today. covid test neg. Pt has prod cough with yellow phlegm. Pt is not SOB and no CP. Pt said the lower lt leg is bothering pt and there is a pain, pressure and tightness in lower lt leg;there is also swelling in lt lower leg. no redness, no warm to touch. The veins in lt lower leg are protruding and pt is not urinating normally; not voiding often, pt also having watery diarrhea. Pt will go to ED for eval of respiratory issues, not voiding well and lt lower leg pressure and swelling. Sending note to Dr Glori Bickers and [place] CMA.

## 2021-03-20 NOTE — Telephone Encounter (Signed)
Agree with that advisement.  I will watch for correspondence.  Please check in with her tomorrow

## 2021-03-20 NOTE — ED Triage Notes (Signed)
Pt via POV from home. Pt c/o bilateral leg swelling. Pt states that the L leg is painful and numb. Pt sent by doctor for possible blood clot. Pt is A&Ox4 and NAD

## 2021-03-20 NOTE — ED Provider Notes (Signed)
[facility] Emergency Department Provider Note  Time seen: 3:47 PM  I have reviewed the triage vital signs and the nursing notes.   HISTORY  Chief Complaint Leg Swelling   HPI Tammy Manning is a 86 y.o. female with a past medical history of gastric reflux, hypertension, hyperlipidemia, presents to the emergency department for left leg pain and a cough.  According to the patient for the past month or so she has been experiencing intermittent discomfort in her left leg and has noticed that the veins are sticking out the left leg more than the right leg.  Patient also states for the past 2 weeks she has had a cough.  She saw her doctor and is finishing a course of Augmentin but continues to have a cough.  Given the leg pain the doctor was concerned to have her come to the emergency department to rule out a blood clot.  Patient states her doctor also recommended she get a COVID swab while she is here.  Patient denies any chest pain at any point.  No fever.  Denies any leg swelling.   Past Medical History:  Diagnosis Date  . Anginal pain ([place])   . Aortic insufficiency    a. 02/2020 Echo: EF 60-65%, no rwma, Gr1 DD, nl RV fxn, mildly dil LA, triv AI, Asc Ao 1m.  . Arthritis    Knees  . B12 deficiency    Mild  . Bucket-handle tear of lateral meniscus of left knee as current injury 12/30/2012  . Cancer (HCC)    Skin, basal cell on nose and eyelid  . DJD (degenerative joint disease)    Low back  . Dyspepsia   . GERD (gastroesophageal reflux disease)   . Heart murmur   . Heart palpitations   . History of cardiac cath    a. 02/2017 Cath: Nl cors. Nl LV fxn. Abd Aogram w/o RAS.  .[name] KitchenHOH (hard of hearing)   . Hyperlipidemia   . Hypertension   . Lactose intolerance   . Mixed incontinence   . PONV (postoperative nausea and vomiting)   . Vulvar irritation    from urine pad, uses Triamcinolone oint.    Patient Active Problem List   Diagnosis Date Noted  . Elevated  rheumatoid factor 11/25/2020  . Bilateral hand pain 11/25/2020  . Pain in right shoulder 10/16/2020  . Joint pain 10/16/2020  . Joint swelling 10/16/2020  . Acute sinusitis 10/09/2020  . Estrogen deficiency 04/30/2020  . Low back pain 03/15/2020  . Dry mouth 03/15/2020  . Osteoarthritis of right hip 01/02/2020  . Groin pain, right 01/01/2020  . Trochanteric bursitis of right hip 11/09/2018  . Transient global amnesia 05/12/2018  . Right thyroid nodule 05/12/2018  . TIA (transient ischemic attack) 05/09/2018  . Aortic insufficiency 09/10/2015  . Stress reaction 09/08/2015  . Encounter for Medicare annual wellness exam 02/28/2013  . Bucket-handle tear of lateral meniscus of left knee as current injury 12/30/2012  . Episodic atrial fibrillation ([place] 11/17/2012  . Special screening for malignant neoplasms, colon 01/21/2012  . Hearing loss of both ears 01/14/2012  . Adverse drug reaction 03/12/2011  . HEADACHE, CHRONIC 09/24/2010  . Leg pain 08/04/2010  . B12 deficiency 03/17/2010  . DYSPEPSIA 03/17/2010  . POSTMENOPAUSAL STATUS 08/20/2008  . Essential hypertension 07/19/2007  . Hyperlipidemia 06/24/2007  . CONSTIPATION 06/24/2007  . IBS 06/24/2007  . DERMATITIS, ATOPIC 06/24/2007  . SKIN CANCER, HX OF 06/24/2007    Past Surgical History:  Procedure Laterality Date  . ABDOMINAL AORTOGRAM N/A 03/15/2017   Procedure: Abdominal Aortogram;  Surgeon: Wellington Hampshire, MD;  Location: [facility];  Service: Cardiovascular;  Laterality: N/A;  . ABDOMINAL HYSTERECTOMY  1982   Large fibroids and bleeding  . APPENDECTOMY    . BILATERAL SALPINGOOPHORECTOMY  1986  . BONE GRAFT HIP ILIAC CREST     To Left arm  . BREAST REDUCTION SURGERY    . BREAST SURGERY    . CARDIAC CATHETERIZATION    . CATARACT EXTRACTION W/PHACO Left 07/07/2016   Procedure: CATARACT EXTRACTION PHACO AND INTRAOCULAR LENS PLACEMENT (IOC);  Surgeon: Birder Robson, MD;  Location: [facility];  Service:  Ophthalmology;  Laterality: Left;  [place] 01:10 AP% 18.3 CDE 12.91 Fluid pak lot # [id number]  . CATARACT EXTRACTION W/PHACO Right 07/28/2016   Procedure: CATARACT EXTRACTION PHACO AND INTRAOCULAR LENS PLACEMENT ([place]);  Surgeon: Birder Robson, MD;  Location: [facility];  Service: Ophthalmology;  Laterality: Right;  [place] 01:26 AP% 18.5 CDE 16.12 Fluid pack lot # [id number]  . COLONOSCOPY  07/2016   Dr. Thana Farr  . DILATION AND CURETTAGE OF UTERUS     miscarragesx2  . ESOPHAGOGASTRODUODENOSCOPY     Polyps  . [place]  2006  . KNEE ARTHROSCOPY WITH LATERAL MENISECTOMY Left 12/30/2012   Procedure: KNEE ARTHROSCOPY WITH LATERAL MENISECTOMY;  Surgeon: Johnny Bridge, MD;  Location: [facility];  Service: Orthopedics;  Laterality: Left;  LEFT KNEE SCOPE LATERAL MENISCECTOMY  . LEFT HEART CATH AND CORONARY ANGIOGRAPHY N/A 03/15/2017   Procedure: Left Heart Cath and Coronary Angiography;  Surgeon: Wellington Hampshire, MD;  Location: [facility];  Service: Cardiovascular;  Laterality: N/A;  . MOUTH SURGERY    . skin cancer eyelid  2012  . TONSILLECTOMY      Prior to Admission medications   Medication Sig Start Date End Date Taking? Authorizing Provider  acetaminophen (TYLENOL) 500 MG tablet Take 500 mg by mouth every 6 (six) hours as needed.    [provider]  amoxicillin-clavulanate (AUGMENTIN) 875-125 MG tablet Take 1 tablet by mouth 2 (two) times daily. 03/11/21   Lucretia Kern, DO  aspirin EC 81 MG tablet Take 81 mg by mouth every other day. Swallow whole.    [provider]  atorvastatin (LIPITOR) 10 MG tablet TAKE 1 TABLET BY MOUTH EVERY DAY AT 6 PM 05/13/20   [name], [name], MD  benzonatate (TESSALON PERLES) 100 MG capsule Take 1 capsule (100 mg total) by mouth 3 (three) times daily as needed. 03/11/21   Lucretia Kern, DO  Calcium Carbonate-Vitamin D (CALCIUM 600+D PO) Take 1 tablet by mouth daily.     [provider]  chlorthalidone (HYGROTON) 25  MG tablet 1/2 pill by mouth every 2 days 02/06/21   Tonia Ghent, MD  clobetasol ointment (TEMOVATE) 8.33 % Apply 1 application topically 2 (two) times daily as needed.    [provider]  clorazepate (TRANXENE) 7.5 MG tablet Take 1 tablet (7.5 mg total) by mouth daily as needed. 02/18/21   [name], [name], MD  cyanocobalamin (,VITAMIN B-12,) 1000 MCG/ML injection Inject 1,000 mcg into the muscle every 30 (thirty) days.  12/16/16   [provider]  irbesartan (AVAPRO) 300 MG tablet TAKE 1 TABLET BY MOUTH DAILY 02/18/21   [name], [name], MD  meloxicam (MOBIC) 15 MG tablet Take 1 tablet (15 mg total) by mouth daily as needed for pain. With food 12/06/20  [name], [name], MD  omeprazole (PRILOSEC) 20 MG capsule Take 1 capsule (20 mg total) by mouth 2 (two) times daily as needed. 02/13/21   [name], [name], MD  triamcinolone (NASACORT) 55 MCG/ACT AERO nasal inhaler Place 2 sprays into the nose daily. 02/22/19   [name], [name], MD    Allergies  Allergen Reactions  . Antihistamines, Chlorpheniramine-Type Other (See Comments)    Reaction:  Makes pt hyper   . Atenolol Hypertension  . Cefuroxime Axetil Other (See Comments)    Upset stomach  . Chlorthalidone Other (See Comments)  . Ciprofloxacin Other (See Comments)    Upset stomach  . Co Q 10 [Coenzyme Q10] Other (See Comments)    Reaction:  GI upset   . Crestor [Rosuvastatin Calcium] Other (See Comments)    Reaction:  Myalgias   . Dextromethorphan-Guaifenesin Other (See Comments)    Reaction:  Made pt jittery   . Epinephrine Hives  . Ramipril Other (See Comments)    Upset stomach  . Sulfamethoxazole-Trimethoprim Other (See Comments)    Upset stomach  . Vitamin D Analogs Other (See Comments)    Reaction:  GI upset     Family History  Problem Relation Age of Onset  . Cancer Mother        Colon  . Heart disease Father        CAD  . Diabetes Sister   . Hypothyroidism Sister   . Diabetes Brother   . Leukemia Brother   .  Esophageal cancer Brother   . Cancer Paternal Aunt        Breast  . Diabetes Sister   . Alzheimer's disease Sister   . Diabetes Brother   . Breast cancer Daughter     Social History Social History   Tobacco Use  . Smoking status: Former Smoker    Packs/day: 1.00    Years: 12.00    Pack years: 12.00    Types: Cigarettes    Quit date: 11/24/1963    Years since quitting: 57.3  . Smokeless tobacco: Never Used  Vaping Use  . Vaping Use: Never used  Substance Use Topics  . Alcohol use: No    Alcohol/week: 0.0 standard drinks  . Drug use: No    Review of Systems Constitutional: Negative for fever. Cardiovascular: Negative for chest pain. Respiratory: Negative for shortness of breath.  Positive for cough. Gastrointestinal: Negative for abdominal pain, vomiting  Musculoskeletal: Left leg discomfort intermittent x1 month. Neurological: Negative for headache All other ROS negative  ____________________________________________   PHYSICAL EXAM:  VITAL SIGNS: ED Triage Vitals  Enc Vitals Group     BP 03/20/21 1524 (!) 168/90     Pulse Rate 03/20/21 1524 91     Resp 03/20/21 1524 18     Temp 03/20/21 1524 97.9 F (36.6 C)     Temp Source 03/20/21 1524 Oral     SpO2 03/20/21 1524 100 %     Weight 03/20/21 1525 191 lb (86.6 kg)     Height 03/20/21 1525 5' 4"  (1.626 m)     Head Circumference --      Peak Flow --      Pain Score 03/20/21 1524 3     Pain Loc --      Pain Edu? --      Excl. in [place]? --    Constitutional: Alert and oriented. Well appearing and in no distress. Eyes: Normal exam ENT      Head: Normocephalic and atraumatic.  Mouth/Throat: Mucous membranes are moist. Cardiovascular: Normal rate, regular rhythm.  Respiratory: Normal respiratory effort without tachypnea nor retractions. Breath sounds are clear without wheeze rales or rhonchi but does have an occasional cough. Gastrointestinal: Soft and nontender. No distention.   Musculoskeletal: Nontender  with normal range of motion in all extremities.  No tenderness to palpation.  No edema noted. Neurologic:  Normal speech and language. No gross focal neurologic deficits  Skin:  Skin is warm, dry and intact.  Psychiatric: Mood and affect are normal.   ____________________________________________    EKG  EKG viewed and interpreted by myself shows normal sinus rhythm 81 bpm with a narrow QRS, normal axis, normal intervals, no concerning ST changes.  ____________________________________________    RADIOLOGY  Chest x-ray negative. Ultrasound negative  ____________________________________________   INITIAL IMPRESSION / ASSESSMENT AND PLAN / ED COURSE  Pertinent labs & imaging results that were available during my care of the patient were reviewed by me and considered in my medical decision making (see chart for details).   Patient presents to the emergency department for intermittent left lower extremity discomfort, 2 weeks of cough.  Overall the patient appears well, no distress.  Given the left leg discomfort although no edema noted no tenderness to palpation we will obtain an ultrasound to rule out DVT.  We will check labs including cardiac enzyme and BNP.  We will obtain an EKG and chest x-ray.  We will also obtain a COVID swab.  Patient agreeable to plan of care.  Patient's work-up is overall very reassuring including normal lab work negative troponin, normal BNP.  Normal chest x-ray, reassuring EKG and a negative ultrasound.  Given the patient's reassuring work-up and overall well appearance I believe the patient is safe for discharge home with PCP follow-up.  COVID-negative.  Tammy Manning was evaluated in Emergency Department on 03/20/2021 for the symptoms described in the history of present illness. She was evaluated in the context of the global COVID-19 pandemic, which necessitated consideration that the patient might be at risk for infection with the SARS-CoV-2 virus that  causes COVID-19. Institutional protocols and algorithms that pertain to the evaluation of patients at risk for COVID-19 are in a state of rapid change based on information released by regulatory bodies including the CDC and federal and state organizations. These policies and algorithms were followed during the patient's care in the ED.  ____________________________________________   FINAL CLINICAL IMPRESSION(S) / ED DIAGNOSES  Left leg pain Cough   Harvest Dark, MD 03/20/21 1801

## 2021-03-20 NOTE — ED Notes (Signed)
Patient transported to Ultrasound 

## 2021-03-20 NOTE — ED Notes (Signed)
E-signature for discharge not available, paper copy signed and placed in chart

## 2021-03-21 MED ORDER — HYDROCOD POLST-CPM POLST ER 10-8 MG/5ML PO SUER: 5.0000 mL | Freq: Two times a day (BID) | ORAL | 0 refills | Status: DC | PRN

## 2021-03-21 NOTE — Telephone Encounter (Signed)
Pt has corresponded with Dr. Glori Bickers, via [place], since her visit to the ED.

## 2021-03-24 ENCOUNTER — Encounter: Payer: Self-pay | Admitting: Family Medicine

## 2021-03-24 ENCOUNTER — Ambulatory Visit (INDEPENDENT_AMBULATORY_CARE_PROVIDER_SITE_OTHER): Payer: Medicare Other | Admitting: Family Medicine

## 2021-03-24 ENCOUNTER — Other Ambulatory Visit: Payer: Self-pay

## 2021-03-24 VITALS — BP 158/82 | HR 79 | Temp 97.1°F | Ht 64.0 in | Wt 193.3 lb

## 2021-03-24 DIAGNOSIS — I10 Essential (primary) hypertension: Secondary | ICD-10-CM | POA: Diagnosis not present

## 2021-03-24 DIAGNOSIS — E538 Deficiency of other specified B group vitamins: Secondary | ICD-10-CM

## 2021-03-24 DIAGNOSIS — J0110 Acute frontal sinusitis, unspecified: Secondary | ICD-10-CM | POA: Diagnosis not present

## 2021-03-24 DIAGNOSIS — M79605 Pain in left leg: Secondary | ICD-10-CM | POA: Insufficient documentation

## 2021-03-24 DIAGNOSIS — R059 Cough, unspecified: Secondary | ICD-10-CM | POA: Diagnosis not present

## 2021-03-24 DIAGNOSIS — R600 Localized edema: Secondary | ICD-10-CM | POA: Diagnosis not present

## 2021-03-24 MED ORDER — CHLORTHALIDONE 25 MG PO TABS: 25.0000 mg | ORAL_TABLET | Freq: Every day | ORAL | 3 refills | Status: DC

## 2021-03-24 MED ORDER — CYANOCOBALAMIN 1000 MCG/ML IJ SOLN
1000.0000 ug | Freq: Once | INTRAMUSCULAR | Status: AC
  Administered 2021-03-24: 1000 ug via INTRAMUSCULAR

## 2021-03-24 NOTE — Assessment & Plan Note (Signed)
S/p sinusitis tx with augmentin  tussionex helped/now resolved

## 2021-03-24 NOTE — Assessment & Plan Note (Signed)
Getting monthly injections One ordered today

## 2021-03-24 NOTE — Patient Instructions (Addendum)
If you want to try support hose to help ankle swelling, let me know  I suspect you would need thigh or waist high   Keep taking the spironolactone a whole pill daily (25 mg)  Let's check labs in about 2 weeks   Watch sodium in diet   Follow up with rheumatology as planned   Be aware that the meloxicam can cause increase in swelling in ankles and raise blood pressure   Elevate your feet when you sit   Follow up in about 4 weeks for blood pressure and B12 shot   B12 shot today

## 2021-03-24 NOTE — Progress Notes (Signed)
Subjective:    Patient ID: Tammy Manning, female    DOB: Jan 01, 1935, 86 y.o.   MRN: [id number]  This visit occurred during the SARS-CoV-2 public health emergency.  Safety protocols were in place, including screening questions prior to the visit, additional usage of staff PPE, and extensive cleaning of exam room while observing appropriate contact time as indicated for disinfecting solutions.    HPI  Pt presents for f/u of ER visit for L leg pain and cough   Wt Readings from Last 3 Encounters:  03/24/21 193 lb 5 oz (87.7 kg)  03/20/21 191 lb (86.6 kg)  02/18/21 188 lb 9.6 oz (85.5 kg)   33.18 kg/m  Noted the had L leg pain on and off over the past month  Then veins looked more prominent more than R leg  Cough for 2 wk Was tx with augmentin prior for suspected sinusitis   Venous doppler was neg for DVT EKG  Re assuring  covid negative   DG Chest 2 View  Result Date: 03/20/2021 CLINICAL DATA:  Cough and runny nose EXAM: CHEST - 2 VIEW COMPARISON:  March 12, 2017 FINDINGS: The heart size and mediastinal contours are within normal limits. No focal consolidation. No pleural effusion. No pneumothorax. The visualized skeletal structures are unremarkable. IMPRESSION: No active cardiopulmonary disease. Electronically Signed   By: Dahlia Bailiff MD   On: 03/20/2021 16:25   US Venous Img Lower Unilateral Left  Result Date: 03/20/2021 CLINICAL DATA:  86 year old female with left lower extremity pain and swelling. EXAM: Left LOWER EXTREMITY VENOUS DOPPLER ULTRASOUND TECHNIQUE: Gray-scale sonography with compression, as well as color and duplex ultrasound, were performed to evaluate the deep venous system(s) from the level of the common femoral vein through the popliteal and proximal calf veins. COMPARISON:  None. FINDINGS: VENOUS Normal compressibility of the common femoral, superficial femoral, and popliteal veins, as well as the visualized calf veins. Visualized portions of profunda femoral  vein and great saphenous vein unremarkable. No filling defects to suggest DVT on grayscale or color Doppler imaging. Doppler waveforms show normal direction of venous flow, normal respiratory plasticity and response to augmentation. Limited views of the contralateral common femoral vein are unremarkable. OTHER None. Limitations: none IMPRESSION: Negative. Electronically Signed   By: Anner Crete M.D.   On: 03/20/2021 16:59     Nl BNP and neg troponin  Lab Results  Component Value Date   CREATININE 0.86 03/20/2021   BUN 14 03/20/2021   NA 139 03/20/2021   K 3.7 03/20/2021   CL 107 03/20/2021   CO2 25 03/20/2021   Lab Results  Component Value Date   ALT 14 03/20/2021   AST 17 03/20/2021   ALKPHOS 54 03/20/2021   BILITOT 0.4 03/20/2021    Lab Results  Component Value Date   WBC 8.9 03/20/2021   HGB 11.0 (L) 03/20/2021   HCT 33.6 (L) 03/20/2021   MCV 94.6 03/20/2021   PLT 445 (H) 03/20/2021   Neg flu and covid tests  BP Readings from Last 3 Encounters:  03/24/21 (!) 158/82  03/20/21 (!) 154/84  02/18/21 140/60   Pulse Readings from Last 3 Encounters:  03/24/21 79  03/20/21 88  02/18/21 83   Due for monthly B12 shot today Lab Results  Component Value Date   VITAMINB12 336 02/06/2021   Cough s/p sinus infection Finally gone with tussionex - done with it now   Left leg Was hurting , felt like squeezing and pressure  Veins stuck  out    Now both legs are swollen but only pain in the Left  Has trouble with knees /had gel injection  Has visit for another shot 3 months  (cortisone and then gel)  Not planning a knee replacement now  Knee stays swollen   Drinks lots of water Does not urinate as much as she used to  Takes chlorthalidone  Was on 12.5 mg -she inc to a whole pill   Has rheumatology visit planned when she is over 3 mo out from steroid shot due to elevated ESR  meloxicam helps too   Patient Active Problem List   Diagnosis Date Noted  . Cough  03/24/2021  . Pedal edema 03/24/2021  . Leg pain, left 03/24/2021  . Elevated rheumatoid factor 11/25/2020  . Bilateral hand pain 11/25/2020  . Pain in right shoulder 10/16/2020  . Joint pain 10/16/2020  . Joint swelling 10/16/2020  . Acute sinusitis 10/09/2020  . Estrogen deficiency 04/30/2020  . Low back pain 03/15/2020  . Dry mouth 03/15/2020  . Osteoarthritis of right hip 01/02/2020  . Groin pain, right 01/01/2020  . Trochanteric bursitis of right hip 11/09/2018  . Transient global amnesia 05/12/2018  . Right thyroid nodule 05/12/2018  . TIA (transient ischemic attack) 05/09/2018  . Aortic insufficiency 09/10/2015  . Stress reaction 09/08/2015  . Encounter for Medicare annual wellness exam 02/28/2013  . Bucket-handle tear of lateral meniscus of left knee as current injury 12/30/2012  . Episodic atrial fibrillation ([place]) 11/17/2012  . Special screening for malignant neoplasms, colon 01/21/2012  . Hearing loss of both ears 01/14/2012  . Adverse drug reaction 03/12/2011  . HEADACHE, CHRONIC 09/24/2010  . B12 deficiency 03/17/2010  . DYSPEPSIA 03/17/2010  . POSTMENOPAUSAL STATUS 08/20/2008  . Essential hypertension 07/19/2007  . Hyperlipidemia 06/24/2007  . CONSTIPATION 06/24/2007  . IBS 06/24/2007  . DERMATITIS, ATOPIC 06/24/2007  . SKIN CANCER, HX OF 06/24/2007   Past Medical History:  Diagnosis Date  . Anginal pain ([place])   . Aortic insufficiency    a. 02/2020 Echo: EF 60-65%, no rwma, Gr1 DD, nl RV fxn, mildly dil LA, triv AI, Asc Ao 30m.  . Arthritis    Knees  . B12 deficiency    Mild  . Bucket-handle tear of lateral meniscus of left knee as current injury 12/30/2012  . Cancer ([facility])    Skin, basal cell on nose and eyelid  . DJD (degenerative joint disease)    Low back  . Dyspepsia   . GERD (gastroesophageal reflux disease)   . Heart murmur   . Heart palpitations   . History of cardiac cath    a. 02/2017 Cath: Nl cors. Nl LV fxn. Abd Aogram w/o RAS.  .[name] KitchenHOH  (hard of hearing)   . Hyperlipidemia   . Hypertension   . Lactose intolerance   . Mixed incontinence   . PONV (postoperative nausea and vomiting)   . Vulvar irritation    from urine pad, uses Triamcinolone oint.   Past Surgical History:  Procedure Laterality Date  . ABDOMINAL AORTOGRAM N/A 03/15/2017   Procedure: Abdominal Aortogram;  Surgeon: MWellington Hampshire MD;  [facility];  Service: Cardiovascular;  Laterality: N/A;  . ABDOMINAL HYSTERECTOMY  1982   Large fibroids and bleeding  . APPENDECTOMY    . BILATERAL SALPINGOOPHORECTOMY  1986  . BONE GRAFT HIP ILIAC CREST     To Left arm  . BREAST REDUCTION SURGERY    . BREAST SURGERY    .  CARDIAC CATHETERIZATION    . CATARACT EXTRACTION W/PHACO Left 07/07/2016   Procedure: CATARACT EXTRACTION PHACO AND INTRAOCULAR LENS PLACEMENT (IOC);  Surgeon: Birder Robson, MD;  Location: [facility];  Service: Ophthalmology;  Laterality: Left;  [place] 01:10 AP% 18.3 CDE 12.91 Fluid pak lot # [id number]  . CATARACT EXTRACTION W/PHACO Right 07/28/2016   Procedure: CATARACT EXTRACTION PHACO AND INTRAOCULAR LENS PLACEMENT ([place]);  Surgeon: Birder Robson, MD;  Location: [facility];  Service: Ophthalmology;  Laterality: Right;  [place] 01:26 AP% 18.5 CDE 16.12 Fluid pack lot # [id number]  . COLONOSCOPY  07/2016   Dr. Thana Farr  . DILATION AND CURETTAGE OF UTERUS     miscarragesx2  . ESOPHAGOGASTRODUODENOSCOPY     Polyps  . [place]  2006  . KNEE ARTHROSCOPY WITH LATERAL MENISECTOMY Left 12/30/2012   Procedure: KNEE ARTHROSCOPY WITH LATERAL MENISECTOMY;  Surgeon: Johnny Bridge, MD;  Location: [facility];  Service: Orthopedics;  Laterality: Left;  LEFT KNEE SCOPE LATERAL MENISCECTOMY  . LEFT HEART CATH AND CORONARY ANGIOGRAPHY N/A 03/15/2017   Procedure: Left Heart Cath and Coronary Angiography;  Surgeon: Wellington Hampshire, MD;  Location: [facility];  Service: Cardiovascular;  Laterality: N/A;  . MOUTH SURGERY    .  skin cancer eyelid  2012  . TONSILLECTOMY     Social History   Tobacco Use  . Smoking status: Former Smoker    Packs/day: 1.00    Years: 12.00    Pack years: 12.00    Types: Cigarettes    Quit date: 11/24/1963    Years since quitting: 57.3  . Smokeless tobacco: Never Used  Vaping Use  . Vaping Use: Never used  Substance Use Topics  . Alcohol use: No    Alcohol/week: 0.0 standard drinks  . Drug use: No   Family History  Problem Relation Age of Onset  . Cancer Mother        Colon  . Heart disease Father        CAD  . Diabetes Sister   . Hypothyroidism Sister   . Diabetes Brother   . Leukemia Brother   . Esophageal cancer Brother   . Cancer Paternal Aunt        Breast  . Diabetes Sister   . Alzheimer's disease Sister   . Diabetes Brother   . Breast cancer Daughter    Allergies  Allergen Reactions  . Antihistamines, Chlorpheniramine-Type Other (See Comments)    Reaction:  Makes pt hyper   . Atenolol Hypertension  . Cefuroxime Axetil Other (See Comments)    Upset stomach  . Ciprofloxacin Other (See Comments)    Upset stomach  . Co Q 10 [Coenzyme Q10] Other (See Comments)    Reaction:  GI upset   . Crestor [Rosuvastatin Calcium] Other (See Comments)    Reaction:  Myalgias   . Dextromethorphan-Guaifenesin Other (See Comments)    Reaction:  Made pt jittery   . Epinephrine Hives  . Ramipril Other (See Comments)    Upset stomach  . Sulfamethoxazole-Trimethoprim Other (See Comments)    Upset stomach  . Vitamin D Analogs Other (See Comments)    Reaction:  GI upset    Current Outpatient Medications on File Prior to Visit  Medication Sig Dispense Refill  . acetaminophen (TYLENOL) 500 MG tablet Take 500 mg by mouth every 6 (six) hours as needed.    [name] Kitchen aspirin EC 81 MG tablet Take 81 mg by mouth every other day. Swallow whole.    [name] Kitchen  atorvastatin (LIPITOR) 10 MG tablet TAKE 1 TABLET BY MOUTH EVERY DAY AT 6 PM 90 tablet 3  . Calcium Carbonate-Vitamin D (CALCIUM 600+D PO)  Take 1 tablet by mouth daily.     . clobetasol ointment (TEMOVATE) 5.95 % Apply 1 application topically 2 (two) times daily as needed.    . clorazepate (TRANXENE) 7.5 MG tablet Take 1 tablet (7.5 mg total) by mouth daily as needed. 30 tablet 1  . cyanocobalamin (,VITAMIN B-12,) 1000 MCG/ML injection Inject 1,000 mcg into the muscle every 30 (thirty) days.     . irbesartan (AVAPRO) 300 MG tablet TAKE 1 TABLET BY MOUTH DAILY 90 tablet 3  . meloxicam (MOBIC) 15 MG tablet Take 1 tablet (15 mg total) by mouth daily as needed for pain. With food 90 tablet 0  . omeprazole (PRILOSEC) 20 MG capsule Take 1 capsule (20 mg total) by mouth 2 (two) times daily as needed. 180 capsule 0  . triamcinolone (NASACORT) 55 MCG/ACT AERO nasal inhaler Place 2 sprays into the nose daily. 1 Inhaler 12   No current facility-administered medications on file prior to visit.    Review of Systems  Constitutional: Negative for activity change, appetite change, fatigue, fever and unexpected weight change.  HENT: Negative for congestion, ear pain, rhinorrhea, sinus pressure and sore throat.   Eyes: Negative for pain, redness and visual disturbance.  Respiratory: Negative for cough, shortness of breath and wheezing.        Cough is better  Cardiovascular: Positive for leg swelling. Negative for chest pain and palpitations.  Gastrointestinal: Negative for abdominal pain, blood in stool, constipation and diarrhea.  Endocrine: Negative for polydipsia and polyuria.  Genitourinary: Negative for dysuria, frequency and urgency.  Musculoskeletal: Positive for arthralgias and joint swelling. Negative for back pain and myalgias.  Skin: Negative for pallor and rash.  Allergic/Immunologic: Negative for environmental allergies.  Neurological: Negative for dizziness, syncope and headaches.  Hematological: Negative for adenopathy. Does not bruise/bleed easily.  Psychiatric/Behavioral: Negative for decreased concentration and dysphoric  mood. The patient is not nervous/anxious.        Objective:   Physical Exam Constitutional:      General: She is not in acute distress.    Appearance: Normal appearance. She is well-developed. She is obese. She is not ill-appearing or diaphoretic.  HENT:     Head: Normocephalic and atraumatic.  Eyes:     Conjunctiva/sclera: Conjunctivae normal.     Pupils: Pupils are equal, round, and reactive to light.  Neck:     Thyroid: No thyromegaly.     Vascular: No carotid bruit or JVD.  Cardiovascular:     Rate and Rhythm: Normal rate and regular rhythm.     Heart sounds: Normal heart sounds. No gallop.   Pulmonary:     Effort: Pulmonary effort is normal. No respiratory distress.     Breath sounds: Normal breath sounds. No stridor. No wheezing, rhonchi or rales.     Comments: Good air exch Abdominal:     General: Bowel sounds are normal. There is no distension or abdominal bruit.     Palpations: Abdomen is soft. There is no mass.     Tenderness: There is no abdominal tenderness.  Musculoskeletal:        General: No tenderness.     Cervical back: Normal range of motion and neck supple.     Right lower leg: Edema present.     Left lower leg: Edema present.     Comments: Trace pedal  edema (a little worse on L)  No palp cords Some varicosities that are compressible and nt  Lymphadenopathy:     Cervical: No cervical adenopathy.  Skin:    General: Skin is warm and dry.     Coloration: Skin is not pale.     Findings: No erythema or rash.  Neurological:     Mental Status: She is alert.     Coordination: Coordination normal.     Deep Tendon Reflexes: Reflexes are normal and symmetric. Reflexes normal.  Psychiatric:        Mood and Affect: Mood normal.           Assessment & Plan:   Problem List Items Addressed This Visit      Cardiovascular and Mediastinum   Essential hypertension    BP: (!) 158/82    On meloxicam Plan to inc chlorthalidone to 25 mg daily for this and  pedal edema  Continue avapro 300 mg daily  DASH eating handout given      Relevant Medications   chlorthalidone (HYGROTON) 25 MG tablet     Respiratory   Acute sinusitis    Cough s/p tx is now gone after tx with tussionex  Nl exam        Other   B12 deficiency    Getting monthly injections One ordered today      Cough    S/p sinusitis tx with augmentin  tussionex helped/now resolved       Pedal edema - Primary    This is likely multifactorial Recent ER w/u -no DVT Disc meloxicam/nsaids causing fluid retention  Enc more water and less sodium Given copy of DASH eating plan  Disc leg elevation  Inc chlorthalidone to 25 mg daily (as tol) - lab 2 wk Offered comp hose (would need above knee)- she will think about it  Update if not starting to improve in a week or if worsening        Relevant Orders   Basic metabolic panel   Leg pain, left    Seen in ER Reviewed hospital records, lab results and studies in detail  Reassuring neg w/u for DVT  Suspect this is coming from OA of knee and pedal edema  For now will continue tx per orthopedic  Tylenol prn

## 2021-03-24 NOTE — Assessment & Plan Note (Signed)
BP: (!) 158/82    On meloxicam Plan to inc chlorthalidone to 25 mg daily for this and pedal edema  Continue avapro 300 mg daily  DASH eating handout given

## 2021-03-24 NOTE — Assessment & Plan Note (Signed)
Cough s/p tx is now gone after tx with tussionex  Nl exam

## 2021-03-24 NOTE — Assessment & Plan Note (Signed)
Seen in ER Reviewed hospital records, lab results and studies in detail  Reassuring neg w/u for DVT  Suspect this is coming from OA of knee and pedal edema  For now will continue tx per orthopedic  Tylenol prn

## 2021-03-24 NOTE — Assessment & Plan Note (Signed)
This is likely multifactorial Recent ER w/u -no DVT Disc meloxicam/nsaids causing fluid retention  Enc more water and less sodium Given copy of DASH eating plan  Disc leg elevation  Inc chlorthalidone to 25 mg daily (as tol) - lab 2 wk Offered comp hose (would need above knee)- she will think about it  Update if not starting to improve in a week or if worsening

## 2021-03-31 ENCOUNTER — Encounter: Payer: Self-pay | Admitting: Internal Medicine

## 2021-03-31 ENCOUNTER — Encounter: Payer: Self-pay | Admitting: Family Medicine

## 2021-03-31 NOTE — Telephone Encounter (Signed)
The rheumatoid arthritis antibody tests have been positive before, but did not see much joint inflammation at the clinic visit. We can see her and take another look any time if she is having increased or new joint problems.

## 2021-03-31 NOTE — Telephone Encounter (Signed)
See mychart and directions.    Directions on med [facility]:CWCB 1 tablet (25 mg total) by mouth daily. 1/2 pill by mouth every 2 days   Not sure what dose should be, please correct and resend

## 2021-04-01 ENCOUNTER — Other Ambulatory Visit: Payer: Self-pay | Admitting: Internal Medicine

## 2021-04-01 MED ORDER — CHLORTHALIDONE 25 MG PO TABS: 25.0000 mg | ORAL_TABLET | Freq: Every day | ORAL | 3 refills | Status: DC

## 2021-04-01 NOTE — Telephone Encounter (Signed)
To be as far out as possible would usually be around 3 months, we could follow up in June. Or does she have any treatments planned before then or concerns with that?

## 2021-04-02 DIAGNOSIS — M1712 Unilateral primary osteoarthritis, left knee: Secondary | ICD-10-CM | POA: Diagnosis not present

## 2021-04-02 DIAGNOSIS — M25511 Pain in right shoulder: Secondary | ICD-10-CM | POA: Diagnosis not present

## 2021-04-02 DIAGNOSIS — M542 Cervicalgia: Secondary | ICD-10-CM | POA: Diagnosis not present

## 2021-04-18 DIAGNOSIS — M1712 Unilateral primary osteoarthritis, left knee: Secondary | ICD-10-CM | POA: Diagnosis not present

## 2021-04-22 ENCOUNTER — Encounter: Payer: Self-pay | Admitting: Family Medicine

## 2021-04-22 ENCOUNTER — Other Ambulatory Visit: Payer: Self-pay

## 2021-04-22 ENCOUNTER — Ambulatory Visit (INDEPENDENT_AMBULATORY_CARE_PROVIDER_SITE_OTHER): Payer: Medicare Other | Admitting: Family Medicine

## 2021-04-22 VITALS — BP 135/77 | HR 66 | Temp 97.8°F | Resp 18 | Ht 64.0 in | Wt 187.6 lb

## 2021-04-22 DIAGNOSIS — E538 Deficiency of other specified B group vitamins: Secondary | ICD-10-CM

## 2021-04-22 DIAGNOSIS — L602 Onychogryphosis: Secondary | ICD-10-CM | POA: Diagnosis not present

## 2021-04-22 DIAGNOSIS — I10 Essential (primary) hypertension: Secondary | ICD-10-CM

## 2021-04-22 LAB — BASIC METABOLIC PANEL
BUN: 23 mg/dL (ref 6–23)
CO2: 27 mEq/L (ref 19–32)
Calcium: 9.7 mg/dL (ref 8.4–10.5)
Chloride: 102 mEq/L (ref 96–112)
Creatinine, Ser: 0.85 mg/dL (ref 0.40–1.20)
GFR: 63.06 mL/min (ref >60.00)
Glucose, Bld: 97 mg/dL (ref 70–99)
Potassium: 4.4 mEq/L (ref 3.5–5.1)
Sodium: 138 mEq/L (ref 135–145)

## 2021-04-22 MED ORDER — CYANOCOBALAMIN 1000 MCG/ML IJ SOLN
1000.0000 ug | Freq: Once | INTRAMUSCULAR | Status: AC
Start: 2021-04-22 — End: 2021-04-22
  Administered 2021-04-22: 1000 ug via INTRAMUSCULAR

## 2021-04-22 NOTE — Assessment & Plan Note (Signed)
bp in fair control at this time  BP Readings from Last 1 Encounters:  04/22/21 135/77   No changes needed Most recent labs reviewed  Disc lifstyle change with low sodium diet and exercise  Improved with chlorthalidone 25 mg  Plan to continue avapro 300 mg daily  bp at home better also  bmet today

## 2021-04-22 NOTE — Assessment & Plan Note (Signed)
Both 2nd toes  The 2nd toe of foot is longer than great toe Expect the mild nail thickening is from trauma No color change  Adv to let us know if she cannot trim them  Try to wear well fitting shoes

## 2021-04-22 NOTE — Patient Instructions (Signed)
Blood pressure is better  Continue current medicines  Keep eating a healthy diet   Labs today   B12 shot today

## 2021-04-22 NOTE — Progress Notes (Signed)
Subjective:    Patient ID: Tammy Manning, female    DOB: 1935-03-25, 86 y.o.   MRN: [id number]  This visit occurred during the SARS-CoV-2 public health emergency.  Safety protocols were in place, including screening questions prior to the visit, additional usage of staff PPE, and extensive cleaning of exam room while observing appropriate contact time as indicated for disinfecting solutions.    HPI Pt presents for f/u of HTN and also B12 shot  Wt Readings from Last 3 Encounters:  04/22/21 187 lb 9.6 oz (85.1 kg)  03/24/21 193 lb 5 oz (87.7 kg)  03/20/21 191 lb (86.6 kg)  has lost 6 lb 32.20 kg/m  HTN Last visit bp was high and she had more pedal edema  Swelling is better  Noted taking meloxicam  We inc her chlorthalidone to 25 mg daily  Continues avapro 300 mg daily  Given DASH eating handout  BP Readings from Last 3 Encounters:  04/22/21 135/77  03/24/21 (!) 158/82  03/20/21 (!) 154/84    At home bp is 120s-130 /50s-60  No problems   She had fluid taken off her knee and a cortisone shot (L knee) It feels a lot better  Will need a knee replacement- may have it done in august   Gets B12 shots monthly Lab Results  Component Value Date   VITAMINB12 336 02/06/2021   She took some over the counter potassium   Also mentioned at end of visit that 2nd toe nails are thick- ? Fungus No pain  Tried topical otc    Patient Active Problem List   Diagnosis Date Noted  . Hypertrophic toenail 04/22/2021  . Cough 03/24/2021  . Pedal edema 03/24/2021  . Leg pain, left 03/24/2021  . Elevated rheumatoid factor 11/25/2020  . Bilateral hand pain 11/25/2020  . Pain in right shoulder 10/16/2020  . Joint pain 10/16/2020  . Joint swelling 10/16/2020  . Acute sinusitis 10/09/2020  . Estrogen deficiency 04/30/2020  . Low back pain 03/15/2020  . Dry mouth 03/15/2020  . Osteoarthritis of right hip 01/02/2020  . Groin pain, right 01/01/2020  . Trochanteric bursitis of right  hip 11/09/2018  . Transient global amnesia 05/12/2018  . Right thyroid nodule 05/12/2018  . TIA (transient ischemic attack) 05/09/2018  . Aortic insufficiency 09/10/2015  . Stress reaction 09/08/2015  . Encounter for Medicare annual wellness exam 02/28/2013  . Bucket-handle tear of lateral meniscus of left knee as current injury 12/30/2012  . Episodic atrial fibrillation ([place]) 11/17/2012  . Special screening for malignant neoplasms, colon 01/21/2012  . Hearing loss of both ears 01/14/2012  . Adverse drug reaction 03/12/2011  . HEADACHE, CHRONIC 09/24/2010  . B12 deficiency 03/17/2010  . DYSPEPSIA 03/17/2010  . POSTMENOPAUSAL STATUS 08/20/2008  . Essential hypertension 07/19/2007  . Hyperlipidemia 06/24/2007  . CONSTIPATION 06/24/2007  . IBS 06/24/2007  . DERMATITIS, ATOPIC 06/24/2007  . SKIN CANCER, HX OF 06/24/2007   Past Medical History:  Diagnosis Date  . Anginal pain ([place])   . Aortic insufficiency    a. 02/2020 Echo: EF 60-65%, no rwma, Gr1 DD, nl RV fxn, mildly dil LA, triv AI, Asc Ao 5m.  . Arthritis    Knees  . B12 deficiency    Mild  . Bucket-handle tear of lateral meniscus of left knee as current injury 12/30/2012  . Cancer ([facility])    Skin, basal cell on nose and eyelid  . DJD (degenerative joint disease)    Low back  . Dyspepsia   .  GERD (gastroesophageal reflux disease)   . Heart murmur   . Heart palpitations   . History of cardiac cath    a. 02/2017 Cath: Nl cors. Nl LV fxn. Abd Aogram w/o RAS.  [name] Kitchen HOH (hard of hearing)   . Hyperlipidemia   . Hypertension   . Lactose intolerance   . Mixed incontinence   . PONV (postoperative nausea and vomiting)   . Vulvar irritation    from urine pad, uses Triamcinolone oint.   Past Surgical History:  Procedure Laterality Date  . ABDOMINAL AORTOGRAM N/A 03/15/2017   Procedure: Abdominal Aortogram;  Surgeon: Wellington Hampshire, MD;  Location: [facility];  Service: Cardiovascular;  Laterality: N/A;  . ABDOMINAL  HYSTERECTOMY  1982   Large fibroids and bleeding  . APPENDECTOMY    . BILATERAL SALPINGOOPHORECTOMY  1986  . BONE GRAFT HIP ILIAC CREST     To Left arm  . BREAST REDUCTION SURGERY    . BREAST SURGERY    . CARDIAC CATHETERIZATION    . CATARACT EXTRACTION W/PHACO Left 07/07/2016   Procedure: CATARACT EXTRACTION PHACO AND INTRAOCULAR LENS PLACEMENT (IOC);  Surgeon: Birder Robson, MD;  Location: [facility];  Service: Ophthalmology;  Laterality: Left;  [place] 01:10 AP% 18.3 CDE 12.91 Fluid pak lot # [id number]  . CATARACT EXTRACTION W/PHACO Right 07/28/2016   Procedure: CATARACT EXTRACTION PHACO AND INTRAOCULAR LENS PLACEMENT ([place]);  Surgeon: Birder Robson, MD;  Location: [facility];  Service: Ophthalmology;  Laterality: Right;  [place] 01:26 AP% 18.5 CDE 16.12 Fluid pack lot # [id number]  . COLONOSCOPY  07/2016   Dr. Thana Farr  . DILATION AND CURETTAGE OF UTERUS     miscarragesx2  . ESOPHAGOGASTRODUODENOSCOPY     Polyps  . [place]  2006  . KNEE ARTHROSCOPY WITH LATERAL MENISECTOMY Left 12/30/2012   Procedure: KNEE ARTHROSCOPY WITH LATERAL MENISECTOMY;  Surgeon: Johnny Bridge, MD;  Location: [facility];  Service: Orthopedics;  Laterality: Left;  LEFT KNEE SCOPE LATERAL MENISCECTOMY  . LEFT HEART CATH AND CORONARY ANGIOGRAPHY N/A 03/15/2017   Procedure: Left Heart Cath and Coronary Angiography;  Surgeon: Wellington Hampshire, MD;  Location: [facility];  Service: Cardiovascular;  Laterality: N/A;  . MOUTH SURGERY    . skin cancer eyelid  2012  . TONSILLECTOMY     Social History   Tobacco Use  . Smoking status: Former Smoker    Packs/day: 1.00    Years: 12.00    Pack years: 12.00    Types: Cigarettes    Quit date: 11/24/1963    Years since quitting: 57.4  . Smokeless tobacco: Never Used  Vaping Use  . Vaping Use: Never used  Substance Use Topics  . Alcohol use: No    Alcohol/week: 0.0 standard drinks  . Drug use: No   Family History  Problem Relation Age of  Onset  . Cancer Mother        Colon  . Heart disease Father        CAD  . Diabetes Sister   . Hypothyroidism Sister   . Diabetes Brother   . Leukemia Brother   . Esophageal cancer Brother   . Cancer Paternal Aunt        Breast  . Diabetes Sister   . Alzheimer's disease Sister   . Diabetes Brother   . Breast cancer Daughter    Allergies  Allergen Reactions  . Antihistamines, Chlorpheniramine-Type Other (See Comments)    Reaction:  Makes pt hyper   .  Atenolol Hypertension  . Cefuroxime Axetil Other (See Comments)    Upset stomach  . Ciprofloxacin Other (See Comments)    Upset stomach  . Co Q 10 [Coenzyme Q10] Other (See Comments)    Reaction:  GI upset   . Crestor [Rosuvastatin Calcium] Other (See Comments)    Reaction:  Myalgias   . Dextromethorphan-Guaifenesin Other (See Comments)    Reaction:  Made pt jittery   . Epinephrine Hives  . Ramipril Other (See Comments)    Upset stomach  . Sulfamethoxazole-Trimethoprim Other (See Comments)    Upset stomach  . Vitamin D Analogs Other (See Comments)    Reaction:  GI upset    Current Outpatient Medications on File Prior to Visit  Medication Sig Dispense Refill  . acetaminophen (TYLENOL) 500 MG tablet Take 500 mg by mouth every 6 (six) hours as needed.    [name] Kitchen aspirin EC 81 MG tablet Take 81 mg by mouth every other day. Swallow whole.    [name] Kitchen atorvastatin (LIPITOR) 10 MG tablet TAKE 1 TABLET BY MOUTH EVERY DAY AT 6 PM 90 tablet 3  . Calcium Carbonate-Vitamin D (CALCIUM 600+D PO) Take 1 tablet by mouth daily.     . chlorthalidone (HYGROTON) 25 MG tablet Take 1 tablet (25 mg total) by mouth daily. 30 tablet 3  . clobetasol ointment (TEMOVATE) 5.68 % Apply 1 application topically 2 (two) times daily as needed.    . clorazepate (TRANXENE) 7.5 MG tablet Take 1 tablet (7.5 mg total) by mouth daily as needed. 30 tablet 1  . cyanocobalamin (,VITAMIN B-12,) 1000 MCG/ML injection Inject 1,000 mcg into the muscle every 30 (thirty) days.     .  irbesartan (AVAPRO) 300 MG tablet TAKE 1 TABLET BY MOUTH DAILY 90 tablet 3  . meloxicam (MOBIC) 15 MG tablet Take 1 tablet (15 mg total) by mouth daily as needed for pain. With food 90 tablet 0  . omeprazole (PRILOSEC) 20 MG capsule Take 1 capsule (20 mg total) by mouth 2 (two) times daily as needed. 180 capsule 0  . triamcinolone (NASACORT) 55 MCG/ACT AERO nasal inhaler Place 2 sprays into the nose daily. 1 Inhaler 12   No current facility-administered medications on file prior to visit.    Review of Systems  Constitutional: Negative for activity change, appetite change, fatigue, fever and unexpected weight change.  HENT: Negative for congestion, ear pain, rhinorrhea, sinus pressure and sore throat.   Eyes: Negative for pain, redness and visual disturbance.  Respiratory: Negative for cough, shortness of breath and wheezing.   Cardiovascular: Negative for chest pain and palpitations.  Gastrointestinal: Negative for abdominal pain, blood in stool, constipation and diarrhea.  Endocrine: Negative for polydipsia and polyuria.  Genitourinary: Negative for dysuria, frequency and urgency.  Musculoskeletal: Positive for arthralgias. Negative for back pain and myalgias.       L knee pain  Skin: Negative for pallor and rash.       Toe nail problem  Allergic/Immunologic: Negative for environmental allergies.  Neurological: Negative for dizziness, syncope and headaches.  Hematological: Negative for adenopathy. Does not bruise/bleed easily.  Psychiatric/Behavioral: Negative for decreased concentration and dysphoric mood. The patient is not nervous/anxious.        Objective:   Physical Exam Constitutional:      General: She is not in acute distress.    Appearance: Normal appearance. She is well-developed. She is obese. She is not ill-appearing or diaphoretic.  HENT:     Head: Normocephalic and atraumatic.  Eyes:  Conjunctiva/sclera: Conjunctivae normal.     Pupils: Pupils are equal, round,  and reactive to light.  Neck:     Thyroid: No thyromegaly.     Vascular: No carotid bruit or JVD.  Cardiovascular:     Rate and Rhythm: Normal rate and regular rhythm.     Heart sounds: Normal heart sounds. No gallop.   Pulmonary:     Effort: Pulmonary effort is normal. No respiratory distress.     Breath sounds: Normal breath sounds. No wheezing or rales.  Abdominal:     General: There is no distension or abdominal bruit.     Tenderness: There is no abdominal tenderness.  Musculoskeletal:     Cervical back: Normal range of motion and neck supple.     Right lower leg: No edema.     Left lower leg: No edema.  Lymphadenopathy:     Cervical: No cervical adenopathy.  Skin:    General: Skin is warm and dry.     Coloration: Skin is not pale.     Findings: No erythema or rash.     Comments: Both 2nd toenails are thicker than other nails  No discoloration or tenderness  Neurological:     Mental Status: She is alert.     Coordination: Coordination normal.     Deep Tendon Reflexes: Reflexes are normal and symmetric. Reflexes normal.  Psychiatric:        Mood and Affect: Mood normal.           Assessment & Plan:   Problem List Items Addressed This Visit      Cardiovascular and Mediastinum   Essential hypertension - Primary    bp in fair control at this time  BP Readings from Last 1 Encounters:  04/22/21 135/77   No changes needed Most recent labs reviewed  Disc lifstyle change with low sodium diet and exercise  Improved with chlorthalidone 25 mg  Plan to continue avapro 300 mg daily  bp at home better also  bmet today      Relevant Orders   Basic metabolic panel     Musculoskeletal and Integument   Hypertrophic toenail    Both 2nd toes  The 2nd toe of foot is longer than great toe Expect the mild nail thickening is from trauma No color change  Adv to let us know if she cannot trim them  Try to wear well fitting shoes         Other   B12 deficiency    B12  shot today  Continue monthly

## 2021-04-22 NOTE — Assessment & Plan Note (Signed)
B12 shot today  Continue monthly

## 2021-04-23 ENCOUNTER — Telehealth: Payer: Self-pay | Admitting: Cardiovascular Disease

## 2021-04-23 NOTE — Telephone Encounter (Signed)
   [facility] Pre-operative Risk Assessment    Patient Name: Tammy Manning  DOB: [birth year]  MRN: [id number]   HEARTCARE STAFF: - Please ensure there is not already an duplicate clearance open for this procedure. - Under Visit Info/Reason for Call, type in Other and utilize the format Clearance MM/DD/YY or Clearance TBD. Do not use dashes or single digits. - If request is for dental extraction, please clarify the # of teeth to be extracted. - If the patient is currently at the dentist's office, call Pre-Op APP to address. If the patient is not currently in the dentist office, please route to the Pre-Op pool  Request for surgical clearance:  1. What type of surgery is being performed? Left total knee replacement   2. When is this surgery scheduled? TBD  3. What type of clearance is required (medical clearance vs. Pharmacy clearance to hold med vs. Both)? both  4. Are there any medications that need to be held prior to surgery and how long? Not listed, please advise if needed  5. Practice name and name of physician performing surgery? [facility] - Dr Marchia Bond   6. What is the office phone number? [phone]   7.   What is the office fax number? [phone]  8.   Anesthesia type (None, local, MAC, general) ? Not listed    Ace Gins 04/23/2021, 3:21 PM  _________________________________________________________________   (provider comments below)

## 2021-04-24 NOTE — Telephone Encounter (Signed)
   Name: Tammy Manning  DOB: 25-Nov-1934  MRN: [id number]   Primary Cardiologist: Kathlyn Sacramento, MD  Chart reviewed as part of pre-operative protocol coverage. Patient was contacted 04/24/2021 in reference to pre-operative risk assessment for pending surgery as outlined below.  Tammy Manning was last seen on 01/30/21 by Ignacia Bayley, NP.  Since that day, Tammy Manning has done well. Tells me her chlorthalidone makes her feel "weak" but it keeps her blood pressure well controlled. Exercise tolerance difficult to ascertain given leg weakness and arthritis but tells me she can walk 1 block.   Tells me she is not planning to have her orthopedic surgery until at least August. As that will be approaching 6 months since last clinic visit and per patient request, recommend  she will require a follow-up visit in order to better assess preoperative cardiovascular risk.  Will route to our preop callback team for assistance in scheduling. Miss Houser prefers to see Dr. Fletcher Anon only.   As she has no known coronary artery disease, she could hold Aspirin at the direction of surgeon as clinically indicated.   Pre-op covering staff: - Please schedule appointment and call patient to inform them. If patient already had an upcoming appointment within acceptable timeframe, please add "pre-op clearance" to the appointment notes so provider is aware. - Please contact requesting surgeon's office via preferred method (i.e, phone, fax) to inform them of need for appointment prior to surgery.  If applicable, this message will also be routed to pharmacy pool and/or primary cardiologist for input on holding anticoagulant/antiplatelet agent as requested below so that this information is available to the clearing provider at time of patient's appointment.   Loel Dubonnet, NP  04/24/2021, 11:08 AM  She has an exercise tolerance of >4 METS. Tells me she  "Really   Therefore, based on ACC/AHA guidelines, the patient  would be at acceptable risk for the planned procedure without further cardiovascular testing.   The patient was advised that if she develops new symptoms prior to surgery to contact our office to arrange for a follow-up visit, and she verbalized understanding.  I will route this recommendation to the requesting party via Epic fax function and remove from pre-op pool. Please call with questions.  Loel Dubonnet, NP 04/24/2021, 11:02 AM

## 2021-04-24 NOTE — Telephone Encounter (Signed)
Appt scheduled. Provider and requesting party aware. Recommendations will be provided at office visit. Will remove from preop pool.  Loel Dubonnet, NP

## 2021-04-24 NOTE — Telephone Encounter (Signed)
S/w the pt and stated that I did not have an appt with Dr. Fletcher Anon, though I did have an option for her. I offered her an appt with APP Cadence Kathlen Mody, [facility] same day Dr. Fletcher Anon is in the office, that way if she herself had any questions for MD or if the [facility] has any questions for MD, the MD is there in the office all day. Pt is agreeable to this appt scheduled for 05/08/21 @ 8:45. Pt thanked me for the call and the help. I will send clearance notes to MD for upcoming appt. Will send FYI to requesting office pt has appt 05/08/21.

## 2021-04-26 ENCOUNTER — Other Ambulatory Visit: Payer: Self-pay | Admitting: Family Medicine

## 2021-04-28 ENCOUNTER — Inpatient Hospital Stay
Admission: EM | Admit: 2021-04-28 | Discharge: 2021-04-30 | DRG: 310 | Disposition: A | Discharge status: Home / Self Care | Payer: Medicare Other | Attending: Family Medicine | Admitting: Family Medicine

## 2021-04-28 ENCOUNTER — Other Ambulatory Visit: Payer: Self-pay

## 2021-04-28 ENCOUNTER — Encounter: Payer: Self-pay | Admitting: Emergency Medicine

## 2021-04-28 DIAGNOSIS — Z20822 Contact with and (suspected) exposure to covid-19: Secondary | ICD-10-CM | POA: Diagnosis present

## 2021-04-28 DIAGNOSIS — Z888 Allergy status to other drugs, medicaments and biological substances status: Secondary | ICD-10-CM | POA: Diagnosis not present

## 2021-04-28 DIAGNOSIS — Z833 Family history of diabetes mellitus: Secondary | ICD-10-CM | POA: Diagnosis not present

## 2021-04-28 DIAGNOSIS — I4891 Unspecified atrial fibrillation: Secondary | ICD-10-CM | POA: Diagnosis present

## 2021-04-28 DIAGNOSIS — I10 Essential (primary) hypertension: Secondary | ICD-10-CM | POA: Diagnosis not present

## 2021-04-28 DIAGNOSIS — Z882 Allergy status to sulfonamides status: Secondary | ICD-10-CM | POA: Diagnosis not present

## 2021-04-28 DIAGNOSIS — G319 Degenerative disease of nervous system, unspecified: Secondary | ICD-10-CM | POA: Diagnosis not present

## 2021-04-28 DIAGNOSIS — R0989 Other specified symptoms and signs involving the circulatory and respiratory systems: Secondary | ICD-10-CM | POA: Diagnosis not present

## 2021-04-28 DIAGNOSIS — Z806 Family history of leukemia: Secondary | ICD-10-CM

## 2021-04-28 DIAGNOSIS — E538 Deficiency of other specified B group vitamins: Secondary | ICD-10-CM | POA: Diagnosis present

## 2021-04-28 DIAGNOSIS — I480 Paroxysmal atrial fibrillation: Principal | ICD-10-CM | POA: Diagnosis present

## 2021-04-28 DIAGNOSIS — I77810 Thoracic aortic ectasia: Secondary | ICD-10-CM | POA: Diagnosis present

## 2021-04-28 DIAGNOSIS — Z8249 Family history of ischemic heart disease and other diseases of the circulatory system: Secondary | ICD-10-CM

## 2021-04-28 DIAGNOSIS — Z820 Family history of epilepsy and other diseases of the nervous system: Secondary | ICD-10-CM

## 2021-04-28 DIAGNOSIS — Z7901 Long term (current) use of anticoagulants: Secondary | ICD-10-CM

## 2021-04-28 DIAGNOSIS — I351 Nonrheumatic aortic (valve) insufficiency: Secondary | ICD-10-CM | POA: Diagnosis present

## 2021-04-28 DIAGNOSIS — Z87891 Personal history of nicotine dependence: Secondary | ICD-10-CM

## 2021-04-28 DIAGNOSIS — D72829 Elevated white blood cell count, unspecified: Secondary | ICD-10-CM | POA: Diagnosis present

## 2021-04-28 DIAGNOSIS — R000 Tachycardia, unspecified: Secondary | ICD-10-CM | POA: Diagnosis not present

## 2021-04-28 DIAGNOSIS — E785 Hyperlipidemia, unspecified: Secondary | ICD-10-CM

## 2021-04-28 DIAGNOSIS — Z881 Allergy status to other antibiotic agents status: Secondary | ICD-10-CM | POA: Diagnosis not present

## 2021-04-28 DIAGNOSIS — Z9181 History of falling: Secondary | ICD-10-CM | POA: Diagnosis not present

## 2021-04-28 DIAGNOSIS — I160 Hypertensive urgency: Secondary | ICD-10-CM

## 2021-04-28 DIAGNOSIS — Z66 Do not resuscitate: Secondary | ICD-10-CM | POA: Diagnosis present

## 2021-04-28 DIAGNOSIS — K219 Gastro-esophageal reflux disease without esophagitis: Secondary | ICD-10-CM | POA: Diagnosis present

## 2021-04-28 DIAGNOSIS — R0789 Other chest pain: Secondary | ICD-10-CM | POA: Diagnosis not present

## 2021-04-28 DIAGNOSIS — E876 Hypokalemia: Secondary | ICD-10-CM | POA: Diagnosis present

## 2021-04-28 DIAGNOSIS — R079 Chest pain, unspecified: Secondary | ICD-10-CM | POA: Diagnosis not present

## 2021-04-28 DIAGNOSIS — R739 Hyperglycemia, unspecified: Secondary | ICD-10-CM | POA: Diagnosis present

## 2021-04-28 DIAGNOSIS — Z803 Family history of malignant neoplasm of breast: Secondary | ICD-10-CM | POA: Diagnosis not present

## 2021-04-28 DIAGNOSIS — S0990XA Unspecified injury of head, initial encounter: Secondary | ICD-10-CM | POA: Diagnosis not present

## 2021-04-28 DIAGNOSIS — Z800 Family history of malignant neoplasm of digestive organs: Secondary | ICD-10-CM | POA: Diagnosis not present

## 2021-04-28 LAB — COMPREHENSIVE METABOLIC PANEL
ALT: 12 U/L (ref 0–44)
AST: 17 U/L (ref 15–41)
Albumin: 3.6 g/dL (ref 3.5–5.0)
Alkaline Phosphatase: 56 U/L (ref 38–126)
Anion gap: 8 (ref 5–15)
BUN: 24 mg/dL — ABNORMAL HIGH (ref 8–23)
CO2: 23 mmol/L (ref 22–32)
Calcium: 8.8 mg/dL — ABNORMAL LOW (ref 8.9–10.3)
Chloride: 103 mmol/L (ref 98–111)
Creatinine, Ser: 1.02 mg/dL — ABNORMAL HIGH (ref 0.44–1.00)
GFR, Estimated: 54 mL/min — ABNORMAL LOW (ref >60)
Glucose, Bld: 144 mg/dL — ABNORMAL HIGH (ref 70–99)
Potassium: 3.5 mmol/L (ref 3.5–5.1)
Sodium: 134 mmol/L — ABNORMAL LOW (ref 135–145)
Total Bilirubin: 0.7 mg/dL (ref 0.3–1.2)
Total Protein: 6.5 g/dL (ref 6.5–8.1)

## 2021-04-28 LAB — CBC
HCT: 34.3 % — ABNORMAL LOW (ref 36.0–46.0)
Hemoglobin: 11.2 g/dL — ABNORMAL LOW (ref 12.0–15.0)
MCH: 30.5 pg (ref 26.0–34.0)
MCHC: 32.7 g/dL (ref 30.0–36.0)
MCV: 93.5 fL (ref 80.0–100.0)
Platelets: 368 10*3/uL (ref 150–400)
RBC: 3.67 MIL/uL — ABNORMAL LOW (ref 3.87–5.11)
RDW: 14.1 % (ref 11.5–15.5)
WBC: 10.8 10*3/uL — ABNORMAL HIGH (ref 4.0–10.5)
nRBC: 0 % (ref 0.0–0.2)

## 2021-04-28 LAB — RESP PANEL BY RT-PCR (FLU A&B, COVID) ARPGX2
Influenza A by PCR: NEGATIVE
Influenza B by PCR: NEGATIVE
SARS Coronavirus 2 by RT PCR: NEGATIVE

## 2021-04-28 LAB — TROPONIN I (HIGH SENSITIVITY)
Troponin I (High Sensitivity): 7 ng/L (ref <18)
Troponin I (High Sensitivity): 17 ng/L (ref <18)

## 2021-04-28 LAB — MAGNESIUM: Magnesium: 1.7 mg/dL (ref 1.7–2.4)

## 2021-04-28 MED ORDER — PANTOPRAZOLE SODIUM 40 MG PO TBEC
40.0000 mg | DELAYED_RELEASE_TABLET | Freq: Every day | ORAL | Status: DC
  Administered 2021-04-29 – 2021-04-30 (×2): 40 mg via ORAL
  Filled 2021-04-28 (×2): qty 1

## 2021-04-28 MED ORDER — DILTIAZEM HCL-DEXTROSE 125-5 MG/125ML-% IV SOLN (PREMIX)
5.0000 mg/h | INTRAVENOUS | Status: DC
  Administered 2021-04-28: 5 mg/h via INTRAVENOUS
  Filled 2021-04-28: qty 125

## 2021-04-28 MED ORDER — ALUM & MAG HYDROXIDE-SIMETH 200-200-20 MG/5ML PO SUSP: 30.0000 mL | ORAL | Status: DC | PRN

## 2021-04-28 MED ORDER — CYANOCOBALAMIN 1000 MCG/ML IJ SOLN
1000.0000 ug | INTRAMUSCULAR | Status: DC
  Filled 2021-04-28: qty 1

## 2021-04-28 MED ORDER — ZOLPIDEM TARTRATE 5 MG PO TABS: 5.0000 mg | ORAL_TABLET | Freq: Every evening | ORAL | Status: DC | PRN

## 2021-04-28 MED ORDER — IRBESARTAN 150 MG PO TABS
300.0000 mg | ORAL_TABLET | Freq: Every day | ORAL | Status: DC
  Administered 2021-04-29 – 2021-04-30 (×2): 300 mg via ORAL
  Filled 2021-04-28 (×3): qty 2

## 2021-04-28 MED ORDER — ATORVASTATIN CALCIUM 10 MG PO TABS
10.0000 mg | ORAL_TABLET | Freq: Every day | ORAL | Status: DC
  Administered 2021-04-29: 10 mg via ORAL
  Filled 2021-04-28: qty 1

## 2021-04-28 MED ORDER — CALCIUM CARBONATE-VITAMIN D 500-200 MG-UNIT PO TABS
1.0000 | ORAL_TABLET | Freq: Every day | ORAL | Status: DC
  Administered 2021-04-29 – 2021-04-30 (×2): 1 via ORAL
  Filled 2021-04-28 (×3): qty 1

## 2021-04-28 MED ORDER — DOCUSATE SODIUM 100 MG PO CAPS: 100.0000 mg | ORAL_CAPSULE | Freq: Two times a day (BID) | ORAL | Status: DC | PRN

## 2021-04-28 MED ORDER — APIXABAN 5 MG PO TABS
5.0000 mg | ORAL_TABLET | Freq: Two times a day (BID) | ORAL | Status: DC
  Administered 2021-04-29 (×2): 5 mg via ORAL
  Filled 2021-04-28 (×2): qty 1

## 2021-04-28 MED ORDER — TRIAMCINOLONE ACETONIDE 55 MCG/ACT NA AERO
2.0000 | INHALATION_SPRAY | Freq: Every day | NASAL | Status: DC
  Filled 2021-04-28: qty 21.6

## 2021-04-28 MED ORDER — ACETAMINOPHEN 325 MG PO TABS: 650.0000 mg | ORAL_TABLET | ORAL | Status: DC | PRN

## 2021-04-28 MED ORDER — ONDANSETRON HCL 4 MG/2ML IJ SOLN: 4.0000 mg | Freq: Four times a day (QID) | INTRAMUSCULAR | Status: DC | PRN

## 2021-04-28 MED ORDER — CLORAZEPATE DIPOTASSIUM 7.5 MG PO TABS
7.5000 mg | ORAL_TABLET | Freq: Every day | ORAL | Status: DC | PRN
  Filled 2021-04-28: qty 1

## 2021-04-28 MED ORDER — POTASSIUM CHLORIDE CRYS ER 20 MEQ PO TBCR
40.0000 meq | EXTENDED_RELEASE_TABLET | Freq: Once | ORAL | Status: AC
  Administered 2021-04-28: 40 meq via ORAL
  Filled 2021-04-28: qty 2

## 2021-04-28 MED ORDER — ALPRAZOLAM 0.25 MG PO TABS: 0.2500 mg | ORAL_TABLET | Freq: Two times a day (BID) | ORAL | Status: DC | PRN

## 2021-04-28 MED ORDER — ASPIRIN EC 81 MG PO TBEC: 81.0000 mg | DELAYED_RELEASE_TABLET | ORAL | Status: DC

## 2021-04-28 MED ORDER — DILTIAZEM HCL 25 MG/5ML IV SOLN
10.0000 mg | Freq: Once | INTRAVENOUS | Status: AC
  Administered 2021-04-28: 10 mg via INTRAVENOUS
  Filled 2021-04-28: qty 5

## 2021-04-28 MED ORDER — CHLORTHALIDONE 25 MG PO TABS
25.0000 mg | ORAL_TABLET | Freq: Every day | ORAL | Status: DC
  Filled 2021-04-28 (×2): qty 1

## 2021-04-28 MED ORDER — SODIUM CHLORIDE 0.9 % IV BOLUS
1000.0000 mL | Freq: Once | INTRAVENOUS | Status: AC
  Administered 2021-04-28: 1000 mL via INTRAVENOUS

## 2021-04-28 NOTE — ED Provider Notes (Signed)
[facility] Emergency Department Provider Note  Time seen: 6:29 PM  I have reviewed the triage vital signs and the nursing notes.   HISTORY  Chief Complaint Tachycardia   HPI Tammy Manning is a 86 y.o. female with a past medical history of angina, gastric reflux, hypertension, hyperlipidemia, presents emergency department for chest discomfort.  According to the patient since yesterday she has been feeling a discomfort in the center of her chest which she thought was "indigestion."   Patient called EMS tonight from home, EMS found the patient to be in atrial fibrillation with rapid ventricular response at 180 bpm.  Patient received 6 of adenosine by EMS without conversion remains in atrial fibrillation with rapid ventricular response.  Patient does state a history of SVT once previously.  Follows up with Dr. Fletcher Anon  Past Medical History:  Diagnosis Date  . Anginal pain ([place])   . Aortic insufficiency    a. 02/2020 Echo: EF 60-65%, no rwma, Gr1 DD, nl RV fxn, mildly dil LA, triv AI, Asc Ao 93m.  . Arthritis    Knees  . B12 deficiency    Mild  . Bucket-handle tear of lateral meniscus of left knee as current injury 12/30/2012  . Cancer (HCC)    Skin, basal cell on nose and eyelid  . DJD (degenerative joint disease)    Low back  . Dyspepsia   . GERD (gastroesophageal reflux disease)   . Heart murmur   . Heart palpitations   . History of cardiac cath    a. 02/2017 Cath: Nl cors. Nl LV fxn. Abd Aogram w/o RAS.  .[name] KitchenHOH (hard of hearing)   . Hyperlipidemia   . Hypertension   . Lactose intolerance   . Mixed incontinence   . PONV (postoperative nausea and vomiting)   . Vulvar irritation    from urine pad, uses Triamcinolone oint.    Patient Active Problem List   Diagnosis Date Noted  . Hypertrophic toenail 04/22/2021  . Cough 03/24/2021  . Pedal edema 03/24/2021  . Leg pain, left 03/24/2021  . Elevated rheumatoid factor 11/25/2020  . Bilateral hand pain  11/25/2020  . Pain in right shoulder 10/16/2020  . Joint pain 10/16/2020  . Joint swelling 10/16/2020  . Acute sinusitis 10/09/2020  . Estrogen deficiency 04/30/2020  . Low back pain 03/15/2020  . Dry mouth 03/15/2020  . Osteoarthritis of right hip 01/02/2020  . Groin pain, right 01/01/2020  . Trochanteric bursitis of right hip 11/09/2018  . Transient global amnesia 05/12/2018  . Right thyroid nodule 05/12/2018  . TIA (transient ischemic attack) 05/09/2018  . Aortic insufficiency 09/10/2015  . Stress reaction 09/08/2015  . Encounter for Medicare annual wellness exam 02/28/2013  . Bucket-handle tear of lateral meniscus of left knee as current injury 12/30/2012  . Episodic atrial fibrillation ([place] 11/17/2012  . Special screening for malignant neoplasms, colon 01/21/2012  . Hearing loss of both ears 01/14/2012  . Adverse drug reaction 03/12/2011  . HEADACHE, CHRONIC 09/24/2010  . B12 deficiency 03/17/2010  . DYSPEPSIA 03/17/2010  . POSTMENOPAUSAL STATUS 08/20/2008  . Essential hypertension 07/19/2007  . Hyperlipidemia 06/24/2007  . CONSTIPATION 06/24/2007  . IBS 06/24/2007  . DERMATITIS, ATOPIC 06/24/2007  . SKIN CANCER, HX OF 06/24/2007    Past Surgical History:  Procedure Laterality Date  . ABDOMINAL AORTOGRAM N/A 03/15/2017   Procedure: Abdominal Aortogram;  Surgeon: MWellington Hampshire MD;  [facility];  Service: Cardiovascular;  Laterality: N/A;  . ABDOMINAL HYSTERECTOMY  1982   Large fibroids and bleeding  . APPENDECTOMY    . BILATERAL SALPINGOOPHORECTOMY  1986  . BONE GRAFT HIP ILIAC CREST     To Left arm  . BREAST REDUCTION SURGERY    . BREAST SURGERY    . CARDIAC CATHETERIZATION    . CATARACT EXTRACTION W/PHACO Left 07/07/2016   Procedure: CATARACT EXTRACTION PHACO AND INTRAOCULAR LENS PLACEMENT (IOC);  Surgeon: Birder Robson, MD;  Location: [facility];  Service: Ophthalmology;  Laterality: Left;  [place] 01:10 AP% 18.3 CDE 12.91 Fluid pak lot #  [id number]  . CATARACT EXTRACTION W/PHACO Right 07/28/2016   Procedure: CATARACT EXTRACTION PHACO AND INTRAOCULAR LENS PLACEMENT ([place]);  Surgeon: Birder Robson, MD;  Location: [facility];  Service: Ophthalmology;  Laterality: Right;  [place] 01:26 AP% 18.5 CDE 16.12 Fluid pack lot # [id number]  . COLONOSCOPY  07/2016   Dr. Thana Farr  . DILATION AND CURETTAGE OF UTERUS     miscarragesx2  . ESOPHAGOGASTRODUODENOSCOPY     Polyps  . [place]  2006  . KNEE ARTHROSCOPY WITH LATERAL MENISECTOMY Left 12/30/2012   Procedure: KNEE ARTHROSCOPY WITH LATERAL MENISECTOMY;  Surgeon: Johnny Bridge, MD;  Location: [place];  Service: Orthopedics;  Laterality: Left;  LEFT KNEE SCOPE LATERAL MENISCECTOMY  . LEFT HEART CATH AND CORONARY ANGIOGRAPHY N/A 03/15/2017   Procedure: Left Heart Cath and Coronary Angiography;  Surgeon: Wellington Hampshire, MD;  Location: [place] [facility];  Service: Cardiovascular;  Laterality: N/A;  . MOUTH SURGERY    . skin cancer eyelid  2012  . TONSILLECTOMY      Prior to Admission medications   Medication Sig Start Date End Date Taking? Authorizing Provider  acetaminophen (TYLENOL) 500 MG tablet Take 500 mg by mouth every 6 (six) hours as needed.    [provider]  aspirin EC 81 MG tablet Take 81 mg by mouth every other day. Swallow whole.    [provider]  atorvastatin (LIPITOR) 10 MG tablet TAKE 1 TABLET BY MOUTH EVERY DAY AT 6 PM 05/13/20   [name], [name], MD  Calcium Carbonate-Vitamin D (CALCIUM 600+D PO) Take 1 tablet by mouth daily.     [provider]  chlorthalidone (HYGROTON) 25 MG tablet Take 1 tablet (25 mg total) by mouth daily. 04/01/21   [name], [name], MD  clobetasol ointment (TEMOVATE) 8.75 % Apply 1 application topically 2 (two) times daily as needed.    [provider]  clorazepate (TRANXENE) 7.5 MG tablet Take 1 tablet (7.5 mg total) by mouth daily as needed. 02/18/21   [name], [name], MD  cyanocobalamin  (,VITAMIN B-12,) 1000 MCG/ML injection Inject 1,000 mcg into the muscle every 30 (thirty) days.  12/16/16   [provider]  irbesartan (AVAPRO) 300 MG tablet TAKE 1 TABLET BY MOUTH DAILY 02/18/21   [name], [name], MD  meloxicam (MOBIC) 15 MG tablet Take 1 tablet (15 mg total) by mouth daily as needed for pain. With food 12/06/20   [name], [name], MD  omeprazole (PRILOSEC) 20 MG capsule Take 1 capsule (20 mg total) by mouth 2 (two) times daily as needed. 02/13/21   [name], [name], MD  triamcinolone (NASACORT) 55 MCG/ACT AERO nasal inhaler Place 2 sprays into the nose daily. 02/22/19   [name], [name], MD    Allergies  Allergen Reactions  . Antihistamines, Chlorpheniramine-Type Other (See Comments)    Reaction:  Makes pt hyper   . Atenolol Hypertension  . Cefuroxime Axetil Other (  See Comments)    Upset stomach  . Ciprofloxacin Other (See Comments)    Upset stomach  . Co Q 10 [Coenzyme Q10] Other (See Comments)    Reaction:  GI upset   . Crestor [Rosuvastatin Calcium] Other (See Comments)    Reaction:  Myalgias   . Dextromethorphan-Guaifenesin Other (See Comments)    Reaction:  Made pt jittery   . Epinephrine Hives  . Ramipril Other (See Comments)    Upset stomach  . Sulfamethoxazole-Trimethoprim Other (See Comments)    Upset stomach  . Vitamin D Analogs Other (See Comments)    Reaction:  GI upset     Family History  Problem Relation Age of Onset  . Cancer Mother        Colon  . Heart disease Father        CAD  . Diabetes Sister   . Hypothyroidism Sister   . Diabetes Brother   . Leukemia Brother   . Esophageal cancer Brother   . Cancer Paternal Aunt        Breast  . Diabetes Sister   . Alzheimer's disease Sister   . Diabetes Brother   . Breast cancer Daughter     Social History Social History   Tobacco Use  . Smoking status: Former Smoker    Packs/day: 1.00    Years: 12.00    Pack years: 12.00    Types: Cigarettes    Quit date: 11/24/1963    Years since  quitting: 57.4  . Smokeless tobacco: Never Used  Vaping Use  . Vaping Use: Never used  Substance Use Topics  . Alcohol use: No    Alcohol/week: 0.0 standard drinks  . Drug use: No    Review of Systems Constitutional: Negative for fever. Cardiovascular: Mild central chest discomfort intermittent over the past 24 hours Respiratory: Negative for shortness of breath. Gastrointestinal: Negative for abdominal pain.  Negative for nausea. Genitourinary: Negative for urinary compaints Musculoskeletal: Negative for musculoskeletal complaints Neurological: Negative for headache All other ROS negative  ____________________________________________   PHYSICAL EXAM:  VITAL SIGNS: ED Triage Vitals  Enc Vitals Group     BP 04/28/21 1826 (!) 178/113     Pulse Rate 04/28/21 1826 (!) 145     Resp 04/28/21 1826 16     Temp --      Temp src --      SpO2 04/28/21 1826 97 %     Weight 04/28/21 1827 285 lb (129.3 kg)     Height 04/28/21 1827 5' 4"  (1.626 m)     Head Circumference --      Peak Flow --      Pain Score 04/28/21 1827 4     Pain Loc --      Pain Edu? --      Excl. in [place]? --    Constitutional: Alert and oriented. Well appearing and in no distress. Eyes: Normal exam ENT      Head: Normocephalic and atraumatic.      Mouth/Throat: Mucous membranes are moist. Cardiovascular: Patient has a rapid and irregular rhythm around 150 bpm. Respiratory: Normal respiratory effort without tachypnea nor retractions. Breath sounds are clear Gastrointestinal: Soft and nontender. No distention.  Musculoskeletal: Nontender with normal range of motion in all extremities.  Neurologic:  Normal speech and language. No gross focal neurologic deficits  Skin:  Skin is warm, dry and intact.  Psychiatric: Mood and affect are normal.   ____________________________________________    EKG  EKG viewed and  interpreted by myself shows what appears to be atrial fibrillation with a rapid ventricular  response around 160 bpm with a narrow QRS, left axis deviation, largely normal intervals with nonspecific ST changes.  ____________________________________________    INITIAL IMPRESSION / ASSESSMENT AND PLAN / ED COURSE  Pertinent labs & imaging results that were available during my care of the patient were reviewed by me and considered in my medical decision making (see chart for details).   Patient presents emergency department for mild chest discomfort found to be in atrial fibrillation with rapid ventricular response EMS states rate around 180 currently around 150 appears to be irregular consistent with atrial fibrillation over SVT which the patient states a history of 1 time previously.  Patient received adenosine by EMS without response.  We will dose 10 mg of diltiazem, IV fluids and continue to closely monitor.  No history of atrial fibrillation previously.  Patient's lab work is largely [place].  Troponin is normal.  Patient's heart rate is once again increased now greater 150 bpm.  Given the patient's new onset A. fib with RVR we will start on a diltiazem infusion and admit to the hospitalist service.  Patient agreeable to plan of care.  Meekah Math was evaluated in Emergency Department on 04/28/2021 for the symptoms described in the history of present illness. She was evaluated in the context of the global COVID-19 pandemic, which necessitated consideration that the patient might be at risk for infection with the SARS-CoV-2 virus that causes COVID-19. Institutional protocols and algorithms that pertain to the evaluation of patients at risk for COVID-19 are in a state of rapid change based on information released by regulatory bodies including the CDC and federal and state organizations. These policies and algorithms were followed during the patient's care in the ED.  CRITICAL CARE Performed by: Harvest Dark   Total critical care time: 30 minutes  Critical care time was  exclusive of separately billable procedures and treating other patients.  Critical care was necessary to treat or prevent imminent or life-threatening deterioration.  Critical care was time spent personally by me on the following activities: development of treatment plan with patient and/or surrogate as well as nursing, discussions with consultants, evaluation of patient's response to treatment, examination of patient, obtaining history from patient or surrogate, ordering and performing treatments and interventions, ordering and review of laboratory studies, ordering and review of radiographic studies, pulse oximetry and re-evaluation of patient's condition.  ____________________________________________   FINAL CLINICAL IMPRESSION(S) / ED DIAGNOSES  Atrial fibrillation with rapid ventricular response   Harvest Dark, MD 04/28/21 2104

## 2021-04-28 NOTE — H&P (Signed)
[place]   PATIENT NAME: Tammy Manning    MR#:  [id number]  DATE OF BIRTH:  1935/08/28  DATE OF ADMISSION:  04/28/2021  PRIMARY CARE PHYSICIAN: [name], [name], MD   Patient is coming from: Home  REQUESTING/REFERRING PHYSICIAN: Harvest Dark, MD  CHIEF COMPLAINT:   Chief Complaint  Patient presents with  . Tachycardia    HISTORY OF PRESENT ILLNESS:  Tammy Manning is a 86 y.o. female with medical history significant for multiple medical problems that are mentioned below, including paroxysmal atrial fibrillation, remote history of SVT, who presented to the ER with acute onset of chest pain felt as tightness since last night with associated palpitations today and feeling of heart fluttering and flipping per her report.  She had radiation of her chest pain to her neck.  She denies any dyspnea or cough or wheezing or emesis.  No fever or chills.  No nausea or vomiting or abdominal pain.  She denied feeling nervous with her symptoms.  No dysuria, oliguria or hematuria or flank pain.  No bleeding diathesis.  ED Course: Upon presentation to the emergency room blood pressure was 178/113 with a heart rate of 145 with otherwise normal vital signs.  After IV Cardizem bolus heart rate was 109 and blood pressure was 129/70.  Heart rate later on was up to 130s.  Patient was started on IV Cardizem drip.  Labs revealed borderline potassium at 3.5 and a BUN of 24, sodium 134.  CBC showed WBC of 10.8 and hemoglobin of 11.2 and hematocrit 34.3 with platelets of 368.  High-sensitivity troponin I was 717.  Follows antigens and COVID-19 PCR came back negative. EKG as reviewed by me : Showed atrial fibrillation with rapid ventricular response of 160 with low voltage QRS and LVH by voltage criteria as well as Q waves inferiorly.  The patient was given IV Cardizem bolus 10 mg followed by IV Cardizem drip, 40 mill equivalent p.o. potassium chloride, 1 L bolus of IV normal saline.  She will be  admitted to a progressive unit bed for further evaluation and management PAST MEDICAL HISTORY:   Past Medical History:  Diagnosis Date  . Anginal pain ([place])   . Aortic insufficiency    a. 02/2020 Echo: EF 60-65%, no rwma, Gr1 DD, nl RV fxn, mildly dil LA, triv AI, Asc Ao 5m.  . Arthritis    Knees  . B12 deficiency    Mild  . Bucket-handle tear of lateral meniscus of left knee as current injury 12/30/2012  . Cancer (HCC)    Skin, basal cell on nose and eyelid  . DJD (degenerative joint disease)    Low back  . Dyspepsia   . GERD (gastroesophageal reflux disease)   . Heart murmur   . Heart palpitations   . History of cardiac cath    a. 02/2017 Cath: Nl cors. Nl LV fxn. Abd Aogram w/o RAS.  .[name] KitchenHOH (hard of hearing)   . Hyperlipidemia   . Hypertension   . Lactose intolerance   . Mixed incontinence   . PONV (postoperative nausea and vomiting)   . Vulvar irritation    from urine pad, uses Triamcinolone oint.    PAST SURGICAL HISTORY:   Past Surgical History:  Procedure Laterality Date  . ABDOMINAL AORTOGRAM N/A 03/15/2017   Procedure: Abdominal Aortogram;  Surgeon: MWellington Hampshire MD;  [facility];  Service: Cardiovascular;  Laterality: N/A;  . ABDOMINAL HYSTERECTOMY  1982  Large fibroids and bleeding  . APPENDECTOMY    . BILATERAL SALPINGOOPHORECTOMY  1986  . BONE GRAFT HIP ILIAC CREST     To Left arm  . BREAST REDUCTION SURGERY    . BREAST SURGERY    . CARDIAC CATHETERIZATION    . CATARACT EXTRACTION W/PHACO Left 07/07/2016   Procedure: CATARACT EXTRACTION PHACO AND INTRAOCULAR LENS PLACEMENT (IOC);  Surgeon: Birder Robson, MD;  Location: [facility];  Service: Ophthalmology;  Laterality: Left;  [place] 01:10 AP% 18.3 CDE 12.91 Fluid pak lot # [id number]  . CATARACT EXTRACTION W/PHACO Right 07/28/2016   Procedure: CATARACT EXTRACTION PHACO AND INTRAOCULAR LENS PLACEMENT ([place]);  Surgeon: Birder Robson, MD;  Location: [facility];  Service: Ophthalmology;   Laterality: Right;  [place] 01:26 AP% 18.5 CDE 16.12 Fluid pack lot # [id number]  . COLONOSCOPY  07/2016   Dr. Thana Farr  . DILATION AND CURETTAGE OF UTERUS     miscarragesx2  . ESOPHAGOGASTRODUODENOSCOPY     Polyps  . [place]  2006  . KNEE ARTHROSCOPY WITH LATERAL MENISECTOMY Left 12/30/2012   Procedure: KNEE ARTHROSCOPY WITH LATERAL MENISECTOMY;  Surgeon: Johnny Bridge, MD;  Location: [place];  Service: Orthopedics;  Laterality: Left;  LEFT KNEE SCOPE LATERAL MENISCECTOMY  . LEFT HEART CATH AND CORONARY ANGIOGRAPHY N/A 03/15/2017   Procedure: Left Heart Cath and Coronary Angiography;  Surgeon: Wellington Hampshire, MD;  Location: [facility];  Service: Cardiovascular;  Laterality: N/A;  . MOUTH SURGERY    . skin cancer eyelid  2012  . TONSILLECTOMY      SOCIAL HISTORY:   Social History   Tobacco Use  . Smoking status: Former Smoker    Packs/day: 1.00    Years: 12.00    Pack years: 12.00    Types: Cigarettes    Quit date: 11/24/1963    Years since quitting: 57.4  . Smokeless tobacco: Never Used  Substance Use Topics  . Alcohol use: No    Alcohol/week: 0.0 standard drinks    FAMILY HISTORY:   Family History  Problem Relation Age of Onset  . Cancer Mother        Colon  . Heart disease Father        CAD  . Diabetes Sister   . Hypothyroidism Sister   . Diabetes Brother   . Leukemia Brother   . Esophageal cancer Brother   . Cancer Paternal Aunt        Breast  . Diabetes Sister   . Alzheimer's disease Sister   . Diabetes Brother   . Breast cancer Daughter     DRUG ALLERGIES:   Allergies  Allergen Reactions  . Antihistamines, Chlorpheniramine-Type Other (See Comments)    Reaction:  Makes pt hyper   . Atenolol Hypertension  . Cefuroxime Axetil Other (See Comments)    Upset stomach  . Ciprofloxacin Other (See Comments)    Upset stomach  . Co Q 10 [Coenzyme Q10] Other (See Comments)    Reaction:  GI upset   . Crestor [Rosuvastatin  Calcium] Other (See Comments)    Reaction:  Myalgias   . Dextromethorphan-Guaifenesin Other (See Comments)    Reaction:  Made pt jittery   . Epinephrine Hives  . Ramipril Other (See Comments)    Upset stomach  . Sulfamethoxazole-Trimethoprim Other (See Comments)    Upset stomach  . Vitamin D Analogs Other (See Comments)    Reaction:  GI upset     REVIEW OF SYSTEMS:   ROS  As per history of present illness. All pertinent systems were reviewed above. Constitutional, HEENT, cardiovascular, respiratory, GI, GU, musculoskeletal, neuro, psychiatric, endocrine, integumentary and hematologic systems were reviewed and are otherwise negative/unremarkable except for positive findings mentioned above in the HPI.   MEDICATIONS AT HOME:   Prior to Admission medications   Medication Sig Start Date End Date Taking? Authorizing Provider  acetaminophen (TYLENOL) 500 MG tablet Take 500 mg by mouth every 6 (six) hours as needed.    [provider]  aspirin EC 81 MG tablet Take 81 mg by mouth every other day. Swallow whole.    [provider]  atorvastatin (LIPITOR) 10 MG tablet TAKE 1 TABLET BY MOUTH EVERY DAY AT 6 PM 05/13/20   [name], [name], MD  Calcium Carbonate-Vitamin D (CALCIUM 600+D PO) Take 1 tablet by mouth daily.     [provider]  chlorthalidone (HYGROTON) 25 MG tablet Take 1 tablet (25 mg total) by mouth daily. 04/01/21   [name], [name], MD  clobetasol ointment (TEMOVATE) 3.66 % Apply 1 application topically 2 (two) times daily as needed.    [provider]  clorazepate (TRANXENE) 7.5 MG tablet Take 1 tablet (7.5 mg total) by mouth daily as needed. 02/18/21   [name], [name], MD  cyanocobalamin (,VITAMIN B-12,) 1000 MCG/ML injection Inject 1,000 mcg into the muscle every 30 (thirty) days.  12/16/16   [provider]  irbesartan (AVAPRO) 300 MG tablet TAKE 1 TABLET BY MOUTH DAILY 02/18/21   [name], [name], MD  meloxicam (MOBIC) 15 MG tablet Take 1  tablet (15 mg total) by mouth daily as needed for pain. With food 12/06/20   [name], [name], MD  omeprazole (PRILOSEC) 20 MG capsule Take 1 capsule (20 mg total) by mouth 2 (two) times daily as needed. 02/13/21   [name], [name], MD  triamcinolone (NASACORT) 55 MCG/ACT AERO nasal inhaler Place 2 sprays into the nose daily. 02/22/19   [name], [name], MD      VITAL SIGNS:  Blood pressure 108/88, pulse (!) 117, temperature 98.6 F (37 C), temperature source Oral, resp. rate (!) 21, height 5' 4"  (1.626 m), weight 129.3 kg, SpO2 99 %.  PHYSICAL EXAMINATION:  Physical Exam  GENERAL:  86 y.o.-year-old Caucasian female patient lying in the bed with no acute distress.  EYES: Pupils equal, round, reactive to light and accommodation. No scleral icterus. Extraocular muscles intact.  HEENT: Head atraumatic, normocephalic. Oropharynx and nasopharynx clear.  NECK:  Supple, no jugular venous distention. No thyroid enlargement, no tenderness.  LUNGS: Normal breath sounds bilaterally, no wheezing, rales,rhonchi or crepitation. No use of accessory muscles of respiration.  CARDIOVASCULAR: Irregular irregular tachycardic rhythm, S1, S2 normal. No murmurs, rubs, or gallops.  ABDOMEN: Soft, nondistended, nontender. Bowel sounds present. No organomegaly or mass.  EXTREMITIES: No pedal edema, cyanosis, or clubbing.  NEUROLOGIC: Cranial nerves II through XII are intact. Muscle strength 5/5 in all extremities. Sensation intact. Gait not checked.  PSYCHIATRIC: The patient is alert and oriented x 3.  Normal affect and good eye contact. SKIN: No obvious rash, lesion, or ulcer.   LABORATORY PANEL:   CBC Recent Labs  Lab 04/28/21 1825  WBC 10.8*  HGB 11.2*  HCT 34.3*  PLT 368   ------------------------------------------------------------------------------------------------------------------  Chemistries  Recent Labs  Lab 04/28/21 1825  NA 134*  K 3.5  CL 103  CO2 23  GLUCOSE 144*  BUN 24*  CREATININE  1.02*  CALCIUM 8.8*  AST 17  ALT  12  ALKPHOS 56  BILITOT 0.7   ------------------------------------------------------------------------------------------------------------------  Cardiac Enzymes No results for input(s): TROPONINI in the last 168 hours. ------------------------------------------------------------------------------------------------------------------  RADIOLOGY:  No results found.    IMPRESSION AND PLAN:  Active Problems:   Atrial fibrillation with RVR (HCC)  1.  Paroxysmal atrial fibrillation with rapid ventricular response. - The patient will be admitted to a progressive unit bed. - We will continue IV Cardizem drip. - Cardiology consult to be obtained. - I notified Dr. Harrington Challenger with [facility] about the patient. - We will optimize her potassium and magnesium.  Her potassium was 3.5 and magnesium 1.7.  She was ordered 40 mill equivalent p.o. potassium chloride and 2 g of IV magnesium sulfate. - I started the patient on p.o. Eliquis as her CHA2DS2-VASc score is 4.  2.  Essential hypertension with hypertensive urgency. - The patient will be on IV Cardizem drip.  I am holding p.o. Norvasc for now given the use of none Hydro pyridine calcium channel blocker. - She can be later switched to p.o. Cardizem. - We will continue her chlorthalidone and Avapro.  3.  Dyslipidemia. - We will continue statin therapy.  4..  GERD. - We will continue PPI therapy.  5.  Vitamin B12 deficiency. - We will continue vitamin B12.   DVT prophylaxis: Lovenox. Code Status: The patient is DNR/DNI.   Family Communication:  The plan of care was discussed in details with the patient (requested no other family members to be notified). I answered all questions. The patient agreed to proceed with the above mentioned plan. Further management will depend upon hospital course. Disposition Plan: Back to previous home environment Consults called: Cardiology consult. All the records are reviewed and  case discussed with ED provider.  Status is: Inpatient  Remains inpatient appropriate because:Hemodynamically unstable, Ongoing diagnostic testing needed not appropriate for outpatient work up, Unsafe d/c plan, IV treatments appropriate due to intensity of illness or inability to take PO and Inpatient level of care appropriate due to severity of illness   Dispo: The patient is from: Home              Anticipated d/c is to: Home              Patient currently is not medically stable to d/c.   Difficult to place patient No      TOTAL TIME TAKING CARE OF THIS PATIENT: 55 minutes.    Christel Mormon M.D on 04/28/2021 at 11:36 PM  [facility]   From 7 PM-7 AM, contact night-coverage [email or web address]  CC: Primary care physician; [name], [name], MD

## 2021-04-28 NOTE — ED Triage Notes (Signed)
Pt via EMS from home. Pt c/o chest discomfort since yesterday. Per EMS, HR was a fib RVR at 180s. EMS gave 6 of adenosine. HR is now 160 on arrival. Pt is A&Ox4 and NAD.

## 2021-04-29 ENCOUNTER — Inpatient Hospital Stay: Payer: Medicare Other

## 2021-04-29 ENCOUNTER — Inpatient Hospital Stay (HOSPITAL_COMMUNITY)
Admit: 2021-04-29 | Discharge: 2021-04-29 | Disposition: A | Discharge status: Home / Self Care | Payer: Medicare Other | Attending: Physician Assistant | Admitting: Physician Assistant

## 2021-04-29 DIAGNOSIS — R079 Chest pain, unspecified: Secondary | ICD-10-CM

## 2021-04-29 LAB — BASIC METABOLIC PANEL
Anion gap: 11 (ref 5–15)
BUN: 17 mg/dL (ref 8–23)
CO2: 19 mmol/L — ABNORMAL LOW (ref 22–32)
Calcium: 9.2 mg/dL (ref 8.9–10.3)
Chloride: 110 mmol/L (ref 98–111)
Creatinine, Ser: 0.75 mg/dL (ref 0.44–1.00)
GFR, Estimated: >60 mL/min (ref >60)
Glucose, Bld: 102 mg/dL — ABNORMAL HIGH (ref 70–99)
Potassium: 4.3 mmol/L (ref 3.5–5.1)
Sodium: 140 mmol/L (ref 135–145)

## 2021-04-29 LAB — CBC
HCT: 35.7 % — ABNORMAL LOW (ref 36.0–46.0)
Hemoglobin: 11.9 g/dL — ABNORMAL LOW (ref 12.0–15.0)
MCH: 30.7 pg (ref 26.0–34.0)
MCHC: 33.3 g/dL (ref 30.0–36.0)
MCV: 92 fL (ref 80.0–100.0)
Platelets: 376 10*3/uL (ref 150–400)
RBC: 3.88 MIL/uL (ref 3.87–5.11)
RDW: 14.5 % (ref 11.5–15.5)
WBC: 10.1 10*3/uL (ref 4.0–10.5)
nRBC: 0 % (ref 0.0–0.2)

## 2021-04-29 LAB — ECHOCARDIOGRAM COMPLETE
AR max vel: 3 cm2
AV Area VTI: 3.37 cm2
AV Area mean vel: 3.05 cm2
AV Mean grad: 4 mmHg
AV Peak grad: 6.7 mmHg
Ao pk vel: 1.3 m/s
Area-P 1/2: 3.72 cm2
Height: 64 in
S' Lateral: 2.54 cm
Weight: 4585.57 oz

## 2021-04-29 MED ORDER — APIXABAN 5 MG PO TABS
5.0000 mg | ORAL_TABLET | Freq: Two times a day (BID) | ORAL | Status: DC
  Administered 2021-04-29 – 2021-04-30 (×2): 5 mg via ORAL
  Filled 2021-04-29 (×2): qty 1

## 2021-04-29 MED ORDER — DILTIAZEM HCL ER COATED BEADS 120 MG PO CP24
120.0000 mg | ORAL_CAPSULE | Freq: Every day | ORAL | Status: DC
  Administered 2021-04-30: 120 mg via ORAL
  Filled 2021-04-29: qty 1

## 2021-04-29 NOTE — Progress Notes (Signed)
*  PRELIMINARY RESULTS* Echocardiogram 2D Echocardiogram has been performed.  Tammy Manning 04/29/2021, 3:22 PM

## 2021-04-29 NOTE — Consult Note (Signed)
Cardiology Consultation:   Patient ID: Tammy Manning MRN: [id number]; DOB: [birth year]  Admit date: 04/28/2021 Date of Consult: 04/29/2021  PCP:  Tammy Greenspan, MD   [place]  Cardiologist:  Tammy Sacramento, MD  Advanced Practice Provider:  No care team member to display Electrophysiologist:  None     Patient Profile:   Tammy Manning is a 86 y.o. female with a hx of aortic valve insufficiency, hypertension with multiple intolerance to antihypertensives, hyperlipidemia, normal coronary arteries by cath 2018, G1 DD, mild aortic root dilation at 39 mm, B12 deficiency, osteoarthritis, and who is being seen today for the evaluation of atrial fibrillation with RVR at the request of Tammy Manning.  History of Present Illness:   Tammy Manning is an 86 year old female with PMH as above.    She has multiple drug intolerances.  In May 2022, she saw her primary care physician.  She had recently enjoyed some salty antipasta at a museum event, and subsequently noticed some bilateral pedal edema.  Her PCP increased her from chlorthalidone 1/2 tablet every other day to 1 full tablet daily.  On review of notes, she reported taking OTC potassium.  She noted resolution of her pedal edema within a few days.  She reports a fall in which she may have hit her head last Thursday 6/2.  She was staying with family and went to get up to use the restroom in the middle of the night.  She started to feel unsteady when she got out of bed.  She reached for something to hold onto, but she did not realize that she was not in her own bedroom with nothing but a wall near her (right side of the bed was right up against the wall).   She fell backward into the wall.  She is unsure if she hit her head.  Since that time, she denies any confusion. She denies a history of falls otherwise.  No signs or symptoms consistent with bleeding leading up to to admission including melena, hematochezia, or  hemoptysis.  On Sunday, 04/27/2021, she reports feeling poorly.  She had an episode of chest pain/pressure in the center of her chest that spread up to her neck.  This episode of chest discomfort started at rest.  She reports that the chest pain/pressure persisted until she went to sleep that night and was gone when she woke the next morning.  She has not had recurrence of chest pain/pressure. She continued to feel poorly 04/28/2021, though she did note no further chest pain.  She kept her self busy around the house to distract herself.  She then noted tachypalpitations that persisted.  She checked her heart rate at home and noted it to be erratic.  She remembers an episode like this back in her 85s.  She stated that this concerned her, therefore she called emergency services so that they could drive her to the hospital.  In the emergency room, she was noted to be in atrial fibrillation with rapid ventricular rate.  Labs historically below.  She was started on IV Cardizem and has subsequently converted to NSR with PACs.  Initial labs showed leukocytosis, anemia (chronic), mild AKI, hypokalemia, hyponatremia, and mild hyperglycemia.  WBC 10.8, hemoglobin 11.2, hematocrit 34.3, creatinine 1.02, BUN 24, sodium 134, glucose 144, calcium 8.8, potassium 3.5, magnesium 1.7.  High-sensitivity troponin 7  17.  COVID/flu negative.  Since admitted to [facility], she denies any further episodes of chest pressure that extends up to her  neck.  No further tachypalpitations noted.  She reports feeling back to her baseline status.  ------------------------------- Of note: Eliquis was initially started at presentation; however, given the new report of a recent fall during which time she may have hit her head, spoke with internal medicine, obtaining head CT -->>> plan for restart of Eliquis following head CT if no acute findings.  Past Medical History:  Diagnosis Date  . Anginal pain ([place])   . Aortic insufficiency    a. 02/2020  Echo: EF 60-65%, no rwma, Gr1 DD, nl RV fxn, mildly dil LA, triv AI, Asc Ao 5m.  . Arthritis    Knees  . B12 deficiency    Mild  . Bucket-handle tear of lateral meniscus of left knee as current injury 12/30/2012  . Cancer (HCC)    Skin, basal cell on nose and eyelid  . DJD (degenerative joint disease)    Low back  . Dyspepsia   . GERD (gastroesophageal reflux disease)   . Heart murmur   . Heart palpitations   . History of cardiac cath    a. 02/2017 Cath: Nl cors. Nl LV fxn. Abd Aogram w/o RAS.  .[name] KitchenHOH (hard of hearing)   . Hyperlipidemia   . Hypertension   . Lactose intolerance   . Mixed incontinence   . PONV (postoperative nausea and vomiting)   . Vulvar irritation    from urine pad, uses Triamcinolone oint.    Past Surgical History:  Procedure Laterality Date  . ABDOMINAL AORTOGRAM N/A 03/15/2017   Procedure: Abdominal Aortogram;  Surgeon: MWellington Hampshire MD;  [facility];  Service: Cardiovascular;  Laterality: N/A;  . ABDOMINAL HYSTERECTOMY  1982   Large fibroids and bleeding  . APPENDECTOMY    . BILATERAL SALPINGOOPHORECTOMY  1986  . BONE GRAFT HIP ILIAC CREST     To Left arm  . BREAST REDUCTION SURGERY    . BREAST SURGERY    . CARDIAC CATHETERIZATION    . CATARACT EXTRACTION W/PHACO Left 07/07/2016   Procedure: CATARACT EXTRACTION PHACO AND INTRAOCULAR LENS PLACEMENT (IOC);  Surgeon: WBirder Robson MD;  Location: [facility] ORS;  Service: Ophthalmology;  Laterality: Left;  [place]:10 AP% 18.3 CDE 12.91 Fluid pak lot # [id number]  . CATARACT EXTRACTION W/PHACO Right 07/28/2016   Procedure: CATARACT EXTRACTION PHACO AND INTRAOCULAR LENS PLACEMENT ([facility];  Surgeon: WBirder Robson MD;  Location: [facility] ORS;  Service: Ophthalmology;  Laterality: Right;  [place]01:26 AP% 18.5 CDE 16.12 Fluid pack lot # [id number]  . COLONOSCOPY  07/2016   Dr. MThana Farr . DILATION AND CURETTAGE OF UTERUS     miscarragesx2  . ESOPHAGOGASTRODUODENOSCOPY     Polyps  . [place] 2006  . KNEE ARTHROSCOPY WITH LATERAL MENISECTOMY Left 12/30/2012   Procedure: KNEE ARTHROSCOPY WITH LATERAL MENISECTOMY;  Surgeon: JJohnny Bridge MD;  [facility]  Service: Orthopedics;  Laterality: Left;  LEFT KNEE SCOPE LATERAL MENISCECTOMY  . LEFT HEART CATH AND CORONARY ANGIOGRAPHY N/A 03/15/2017   Procedure: Left Heart Cath and Coronary Angiography;  Surgeon: MWellington Hampshire MD;  [facility];  Service: Cardiovascular;  Laterality: N/A;  . MOUTH SURGERY    . skin cancer eyelid  2012  . TONSILLECTOMY       Home Medications:  Prior to Admission medications   Medication Sig Start Date End Date Taking? Authorizing Provider  acetaminophen (TYLENOL) 500 MG tablet Take 500 mg by mouth every 6 (six) hours  as needed.   Yes [provider]  amLODipine (NORVASC) 2.5 MG tablet Take 2.5 mg by mouth daily. 04/26/21  Yes [provider]  aspirin EC 81 MG tablet Take 81 mg by mouth every other day. Swallow whole.   Yes [provider]  Calcium Carbonate-Vitamin D (CALCIUM 600+D PO) Take 1 tablet by mouth daily.    Yes [provider]  chlorthalidone (HYGROTON) 25 MG tablet Take 1 tablet (25 mg total) by mouth daily. 04/01/21  Yes [name], [name], MD  clobetasol ointment (TEMOVATE) 0.35 % Apply 1 application topically 2 (two) times daily as needed.   Yes [provider]  clorazepate (TRANXENE) 7.5 MG tablet Take 1 tablet (7.5 mg total) by mouth daily as needed. 02/18/21  Yes [name], [name], MD  cyanocobalamin (,VITAMIN B-12,) 1000 MCG/ML injection Inject 1,000 mcg into the muscle every 30 (thirty) days.  12/16/16  Yes [provider]  irbesartan (AVAPRO) 300 MG tablet TAKE 1 TABLET BY MOUTH DAILY 02/18/21  Yes [name], [name], MD  meloxicam (MOBIC) 15 MG tablet Take 1 tablet (15 mg total) by mouth daily as needed for pain. With food 12/06/20  Yes [name], [name], MD  omeprazole (PRILOSEC) 20 MG capsule Take 1  capsule (20 mg total) by mouth 2 (two) times daily as needed. 02/13/21  Yes [name], [name], MD  triamcinolone (NASACORT) 55 MCG/ACT AERO nasal inhaler Place 2 sprays into the nose daily. 02/22/19  Yes [name], [name], MD  atorvastatin (LIPITOR) 10 MG tablet TAKE 1 TABLET BY MOUTH EVERY DAY AT 6 PM 04/29/21   [name], [name], MD    Inpatient Medications: Scheduled Meds: . apixaban  5 mg Oral BID  . [START ON 04/30/2021] aspirin EC  81 mg Oral QODAY  . atorvastatin  10 mg Oral q1800  . calcium-vitamin D  1 tablet Oral Daily  . chlorthalidone  25 mg Oral Daily  . [START ON 05/28/2021] cyanocobalamin  1,000 mcg Intramuscular Q30 days  . irbesartan  300 mg Oral Daily  . pantoprazole  40 mg Oral Daily  . triamcinolone  2 spray Nasal Daily   Continuous Infusions: . diltiazem (CARDIZEM) infusion Stopped (04/29/21 0953)   PRN Meds: acetaminophen, ALPRAZolam, alum & mag hydroxide-simeth, clorazepate, docusate sodium, ondansetron (ZOFRAN) IV, zolpidem  Allergies:    Allergies  Allergen Reactions  . Antihistamines, Chlorpheniramine-Type Other (See Comments)    Reaction:  Makes pt hyper   . Atenolol Hypertension  . Cefuroxime Axetil Other (See Comments)    Upset stomach  . Ciprofloxacin Other (See Comments)    Upset stomach  . Co Q 10 [Coenzyme Q10] Other (See Comments)    Reaction:  GI upset   . Crestor [Rosuvastatin Calcium] Other (See Comments)    Reaction:  Myalgias   . Dextromethorphan-Guaifenesin Other (See Comments)    Reaction:  Made pt jittery   . Epinephrine Hives  . Ramipril Other (See Comments)    Upset stomach  . Sulfamethoxazole-Trimethoprim Other (See Comments)    Upset stomach  . Vitamin D Analogs Other (See Comments)    Reaction:  GI upset     Social History:   Social History   Socioeconomic History  . Marital status: Married    Spouse name: Not on file  . Number of children: 2  . Years of education: Not on file  . Highest education level: Not on file  Occupational  History  . Occupation: Architect: RETIRED  Tobacco Use  . Smoking status: Former Smoker    Packs/day: 1.00    Years: 12.00    Pack years: 12.00    Types: Cigarettes    Quit date: 11/24/1963    Years since quitting: 57.4  . Smokeless tobacco: Never Used  Vaping Use  . Vaping Use: Never used  Substance and Sexual Activity  . Alcohol use: No    Alcohol/week: 0.0 standard drinks  . Drug use: No  . Sexual activity: Yes  Other Topics Concern  . Not on file  Social History Narrative   Metallurgist.     Social Determinants of Health   Financial Resource Strain: Not on file  Food Insecurity: Not on file  Transportation Needs: Not on file  Physical Activity: Not on file  Stress: Not on file  Social Connections: Not on file  Intimate Partner Violence: Not on file    Family History:    Family History  Problem Relation Age of Onset  . Cancer Mother        Colon  . Heart disease Father        CAD  . Diabetes Sister   . Hypothyroidism Sister   . Diabetes Brother   . Leukemia Brother   . Esophageal cancer Brother   . Cancer Paternal Aunt        Breast  . Diabetes Sister   . Alzheimer's disease Sister   . Diabetes Brother   . Breast cancer Daughter      ROS:  Please see the history of present illness.  Review of Systems  Constitutional: Positive for malaise/fatigue.  Respiratory: Negative for hemoptysis and shortness of breath.        Work of breathing noted at times, though SOB denied.  Cardiovascular: Positive for chest pain and palpitations. Negative for orthopnea, claudication and leg swelling.       No recent leg swelling, this resolved earlier in May  Gastrointestinal: Negative for blood in stool and melena.  Genitourinary: Negative for hematuria.  Musculoskeletal: Positive for falls.       Fall 04/24/2021 --mechanical fall.  She did not lose consciousness.  She may have hit her head.  Neurological: Positive for weakness. Negative for dizziness,  focal weakness and loss of consciousness.    All other ROS reviewed and negative.     Physical Exam/Data:   Vitals:   04/29/21 0819 04/29/21 1030 04/29/21 1100 04/29/21 1130  BP: (!) 144/66 (!) 119/52 136/65 (!) 121/57  Pulse: 100 86 83 86  Resp: 17 (!) 25 20 (!) 22  Temp:      TempSrc:      SpO2: 98% 96% 96% 96%  Weight:      Height:        Intake/Output Summary (Last 24 hours) at 04/29/2021 1357 Last data filed at 04/28/2021 1942 Gross per 24 hour  Intake 1000 ml  Output --  Net 1000 ml   Last 3 Weights 04/29/2021 04/28/2021 04/22/2021  Weight (lbs) 286 lb 9.6 oz 285 lb 187 lb 9.6 oz  Weight (kg) 130 kg 129.275 kg 85.095 kg     Body mass index is 49.19 kg/m.  General:  Well nourished, well developed, in no acute distress.  Joined by her husband. HEENT: normal Neck: no JVD Endocrine:  No thryomegaly Vascular: No carotid bruits; FA pulses 2+ bilaterally without bruits  Cardiac:  normal S1, S2; regular with occasional extrasystole; no murmur  Lungs:  clear to auscultation bilaterally, no wheezing, rhonchi or  rales  Abd: soft, nontender, no hepatomegaly  Ext: no edema Musculoskeletal:  No deformities, BUE and BLE strength normal and equal Skin: warm and dry  Neuro:  no focal abnormalities noted Psych:  Normal affect   EKG:  The EKG was personally reviewed and demonstrates: Atrial fibrillation with rapid ventricular rate  Telemetry:  Telemetry was personally reviewed and demonstrates:  Afib, NSR with PACs  Relevant CV Studies:   Pending updated echo  Echo 03/14/20  1. Left ventricular ejection fraction, by estimation, is 60 to 65%. The  left ventricle has normal function. The left ventricle has no regional  wall motion abnormalities. Left ventricular diastolic parameters are  consistent with Grade I diastolic  dysfunction (impaired relaxation).  2. Right ventricular systolic function is normal. The right ventricular  size is normal. There is mildly elevated  pulmonary artery systolic  pressure.  3. Left atrial size was mildly dilated.  4. The mitral valve is grossly normal. No evidence of mitral valve  regurgitation.  5. The aortic valve is tricuspid. Aortic valve regurgitation is trivial.  Mild aortic valve sclerosis is present, with no evidence of aortic valve  stenosis.  6. Aortic dilatation noted. There is mild dilatation of the ascending  aorta measuring 39 mm.  7. The inferior vena cava is normal in size with greater than 50%  respiratory variability, suggesting right atrial pressure of 3 mmHg.   [place]  2018  The left ventricular systolic function is normal.  LV end diastolic pressure is mildly elevated.  The left ventricular ejection fraction is 55-65% by visual estimate. 1. Normal coronary arteries. 2. Normal LV systolic function and mildly elevated left ventricular end-diastolic pressure.  3. Mildly calcified aortic valve with no evidence of significant stenosis. 4. Abdominal aortogram was performed to evaluate for uncontrolled hypertension. It showed no evidence of renal artery stenosis, no evidence of aortic aneurysm or significant iliac disease. Recommendations: Recommend blood pressure control. Uncontrolled hypertension is likely the culprit for her symptoms.   Laboratory Data:  High Sensitivity Troponin:   Recent Labs  Lab 04/28/21 1825 04/28/21 2030  TROPONINIHS 7 17     Chemistry Recent Labs  Lab 04/28/21 1825 04/29/21 0723  NA 134* 140  K 3.5 4.3  CL 103 110  CO2 23 19*  GLUCOSE 144* 102*  BUN 24* 17  CREATININE 1.02* 0.75  CALCIUM 8.8* 9.2  GFRNONAA 54* >60  ANIONGAP 8 11    Recent Labs  Lab 04/28/21 1825  PROT 6.5  ALBUMIN 3.6  AST 17  ALT 12  ALKPHOS 56  BILITOT 0.7   Hematology Recent Labs  Lab 04/28/21 1825 04/29/21 0723  WBC 10.8* 10.1  RBC 3.67* 3.88  HGB 11.2* 11.9*  HCT 34.3* 35.7*  MCV 93.5 92.0  MCH 30.5 30.7  MCHC 32.7 33.3  RDW 14.1 14.5  PLT 368 376   BNPNo  results for input(s): BNP, PROBNP in the last 168 hours.  DDimer No results for input(s): DDIMER in the last 168 hours.   Radiology/Studies:  No results found.   Assessment and Plan:   New onset atrial fibrillation with rapid ventricular rate -- Presented with Afib with RVR.  Currently NSR with PACs.  Suspect atrial fibrillation may have been triggered by recent low K after increase in diuresis, given low potassium at presentation.  Also considered is her recent episode of chest pain with recommendation for an echo today as below +/- further ischemic work-up if needed.  Discussed anticoagulation with patient agreement  to start Eliquis 5 mg twice daily.  Continue current rate control.  She reports intolerance to beta-blockers.  She will need follow-up in the office to ensure she remains in NSR /recheck of CBC/BMET.  TSH wnl. Maintain electrolytes at goal. Further recommendations if needed, pending repeat echo.  Chest pain with nl cors by 2018 cath -- Reports an episode of chest pain 6/5 that extended into her neck and lasting until she went to sleep that night.  The following day, she had racing heart rate and fatigue.  EKG without acute ST/T changes.  High-sensitivity troponin minimally elevated 7/17.  No further episodes of chest pain. Given her episode of chest pain, will obtain echo to reassess EF/wall motion.  If reduced EF or wall motion abnormalities on echo, further ischemic work-up recommended at that time.  Of note, previous 2018 catheterization with nl Cors.  Eliquis will be restarted in place of ASA.  She does not tolerate beta-blockers.  Continue diltiazem, statin.  Recent mechanical fall / ? Hit her head -- Reports a mechanical fall that occurred within the last week and cannot recall if she hit her head.  No residual symptoms or confusion.  Denies frequent falls.  Given the plan to start anticoagulation, recommend head CT to rule out any head bleed before initiation of anticoagulation.   Holding current Eliquis 5 mg twice daily.  If head CT without significant findings, restart Eliquis 5 mg twice daily.  Continue to monitor H&H.  Hypokalemia, hypomagnesemia -- K+ 3.5 at presentation, resolving with potassium repletion.  Mg 1.7 and has not been rechecked. She does note increased diuresis earlier in May, which could be contributing to her hypokalemia.  Recommend PTA low-dose potassium supplementation with close monitoring of BMET to ensure electrolytes remain at goal s/p increased dose diuresis, as electrolyte abnormalities increase the risk of arrhythmia.  HTN -- Most recent BP well controlled.  Continue current medications.  HLD --Continue statin.  Aortic root dilation --Will obtain echo as above to monitor. HR, BP, LDL control recommended.  Medication intolerances --Intolerant to BB.    For questions or updates, please contact [place] Please consult [email or web address] for contact info under    Signed, Arvil Chaco, PA-C  04/29/2021 1:57 PM

## 2021-04-29 NOTE — Progress Notes (Signed)
[facility] NAME: Tammy Manning    MR#:  [id number]  DATE OF BIRTH:  [birth year]  SUBJECTIVE:  patient came in after having chest pressure not feeling well. Found to be in rapid a fib heart rate in the 140s. Started on Cardizem drip currently in sinus rhythm. Patient overall feels a lot better. No chest pressure. Tolerating PO diet.  REVIEW OF SYSTEMS:   Review of Systems  Constitutional: Negative for chills, fever and weight loss.  HENT: Negative for ear discharge, ear pain and nosebleeds.   Eyes: Negative for blurred vision, pain and discharge.  Respiratory: Negative for sputum production, shortness of breath, wheezing and stridor.   Cardiovascular: Negative for chest pain, palpitations, orthopnea and PND.  Gastrointestinal: Negative for abdominal pain, diarrhea, nausea and vomiting.  Genitourinary: Negative for frequency and urgency.  Musculoskeletal: Negative for back pain and joint pain.  Neurological: Negative for sensory change, speech change, focal weakness and weakness.  Psychiatric/Behavioral: Negative for depression and hallucinations. The patient is not nervous/anxious.    Tolerating Diet:yes Tolerating PT:   DRUG ALLERGIES:   Allergies  Allergen Reactions  . Antihistamines, Chlorpheniramine-Type Other (See Comments)    Reaction:  Makes pt hyper   . Atenolol Hypertension  . Cefuroxime Axetil Other (See Comments)    Upset stomach  . Ciprofloxacin Other (See Comments)    Upset stomach  . Co Q 10 [Coenzyme Q10] Other (See Comments)    Reaction:  GI upset   . Crestor [Rosuvastatin Calcium] Other (See Comments)    Reaction:  Myalgias   . Dextromethorphan-Guaifenesin Other (See Comments)    Reaction:  Made pt jittery   . Epinephrine Hives  . Ramipril Other (See Comments)    Upset stomach  . Sulfamethoxazole-Trimethoprim Other (See Comments)    Upset stomach  . Vitamin D Analogs Other (See Comments)     Reaction:  GI upset     VITALS:  Blood pressure (!) 121/57, pulse 86, temperature 98.6 F (37 C), temperature source Oral, resp. rate (!) 22, height 5' 4"  (1.626 m), weight 130 kg, SpO2 96 %.  PHYSICAL EXAMINATION:   Physical Exam  GENERAL:  86 y.o.-year-old patient lying in the bed with no acute distress. obese LUNGS: Normal breath sounds bilaterally, no wheezing, rales, rhonchi. No use of accessory muscles of respiration.  CARDIOVASCULAR: S1, S2 normal. No murmurs, rubs, or gallops.  ABDOMEN: Soft, nontender, nondistended. Bowel sounds present. No organomegaly or mass.  EXTREMITIES: No cyanosis, clubbing or edema b/l.    NEUROLOGIC: Cranial nerves II through XII are intact. No focal Motor or sensory deficits b/l.   PSYCHIATRIC:  patient is alert and oriented x 3.  SKIN: No obvious rash, lesion, or ulcer.   LABORATORY PANEL:  CBC Recent Labs  Lab 04/29/21 0723  WBC 10.1  HGB 11.9*  HCT 35.7*  PLT 376    Chemistries  Recent Labs  Lab 04/28/21 1825 04/28/21 2030 04/29/21 0723  NA 134*  --  140  K 3.5  --  4.3  CL 103  --  110  CO2 23  --  19*  GLUCOSE 144*  --  102*  BUN 24*  --  17  CREATININE 1.02*  --  0.75  CALCIUM 8.8*  --  9.2  MG  --  1.7  --   AST 17  --   --   ALT 12  --   --   ALKPHOS 56  --   --  BILITOT 0.7  --   --    Cardiac Enzymes No results for input(s): TROPONINI in the last 168 hours. RADIOLOGY:  No results found. ASSESSMENT AND PLAN:  Tammy Manning is a 86 y.o. female with medical history significant for multiple medical problems that are mentioned below, including paroxysmal atrial fibrillation, remote history of SVT, who presented to the ER with acute onset of chest pain felt as tightness since last night with associated palpitations today and feeling of heart fluttering and flipping per her report.  She had radiation of her chest pain to her neck.   A fib with RVR-- now in sinus rhythm -- patient was on Cardizem drip. Turned off. In  sinus rhythm -- cardiology consultation with Dr. Fletcher [facility] -- defer anticoagulation with cardiology after reviewing CT head -- CT head to rule out bleed. Patient apparently had a fall and hit her head last week.  -- currently not on any rate blocking agent. Heart rate is in the 70s  Hypertension -- continue Avapro and chlorthalidone  Gerd -- PPI  dyslipidemia -- on statins  Procedures: Family communication : Consults : [facility] cardiology CODE STATUS:  DVT Prophylaxis :SCD Level of care: Progressive Cardiac Status is: Inpatient  Remains inpatient appropriate because:Inpatient level of care appropriate due to severity of illness   Dispo: The patient is from: Home              Anticipated d/c is to: Home              Patient currently is not medically stable to d/c.   Difficult to place patient No        TOTAL TIME TAKING CARE OF THIS PATIENT: 30 minutes.  >50% time spent on counselling and coordination of care  Note: This dictation was prepared with Dragon dictation along with smaller phrase technology. Any transcriptional errors that result from this process are unintentional.  [name] M.D    [facility]   CC: Primary care physician; [name], [name], MDPatient ID: Tammy Manning, female   DOB: 06-03-1935, 86 y.o.   MRN: [id number]

## 2021-04-29 NOTE — ED Notes (Signed)
Pt given drink at this time

## 2021-04-29 NOTE — ED Notes (Signed)
Report given to [facility]

## 2021-04-29 NOTE — ED Notes (Signed)
Pt eating at this time. Family at bedside

## 2021-04-29 NOTE — Plan of Care (Signed)
  Problem: Education: Goal: Knowledge of General Education information will improve Description: Including pain rating scale, medication(s)/side effects and non-pharmacologic comfort measures 04/29/2021 1559 by Cristela Blue, RN Outcome: Progressing 04/29/2021 1559 by Cristela Blue, RN Outcome: Progressing   Problem: Health Behavior/Discharge Planning: Goal: Ability to manage health-related needs will improve 04/29/2021 1559 by Cristela Blue, RN Outcome: Progressing 04/29/2021 1559 by Cristela Blue, RN Outcome: Progressing   Problem: Clinical Measurements: Goal: Ability to maintain clinical measurements within normal limits will improve 04/29/2021 1559 by Cristela Blue, RN Outcome: Progressing 04/29/2021 1559 by Cristela Blue, RN Outcome: Progressing Goal: Will remain free from infection 04/29/2021 1559 by Cristela Blue, RN Outcome: Progressing 04/29/2021 1559 by Cristela Blue, RN Outcome: Progressing Goal: Diagnostic test results will improve 04/29/2021 1559 by Cristela Blue, RN Outcome: Progressing 04/29/2021 1559 by Cristela Blue, RN Outcome: Progressing Goal: Respiratory complications will improve 04/29/2021 1559 by Cristela Blue, RN Outcome: Progressing 04/29/2021 1559 by Cristela Blue, RN Outcome: Progressing Goal: Cardiovascular complication will be avoided 04/29/2021 1559 by Cristela Blue, RN Outcome: Progressing 04/29/2021 1559 by Cristela Blue, RN Outcome: Progressing   Problem: Activity: Goal: Risk for activity intolerance will decrease 04/29/2021 1559 by Cristela Blue, RN Outcome: Progressing 04/29/2021 1559 by Cristela Blue, RN Outcome: Progressing   Problem: Nutrition: Goal: Adequate nutrition will be maintained 04/29/2021 1559 by Cristela Blue, RN Outcome: Progressing 04/29/2021 1559 by Cristela Blue, RN Outcome: Progressing   Problem: Coping: Goal: Level of anxiety will decrease 04/29/2021 1559 by Cristela Blue, RN Outcome: Progressing 04/29/2021 1559 by  Cristela Blue, RN Outcome: Progressing   Problem: Elimination: Goal: Will not experience complications related to bowel motility 04/29/2021 1559 by Cristela Blue, RN Outcome: Progressing 04/29/2021 1559 by Cristela Blue, RN Outcome: Progressing Goal: Will not experience complications related to urinary retention 04/29/2021 1559 by Cristela Blue, RN Outcome: Progressing 04/29/2021 1559 by Cristela Blue, RN Outcome: Progressing   Problem: Pain Managment: Goal: General experience of comfort will improve 04/29/2021 1559 by Cristela Blue, RN Outcome: Progressing 04/29/2021 1559 by Cristela Blue, RN Outcome: Progressing   Problem: Safety: Goal: Ability to remain free from injury will improve 04/29/2021 1559 by Cristela Blue, RN Outcome: Progressing 04/29/2021 1559 by Cristela Blue, RN Outcome: Progressing   Problem: Skin Integrity: Goal: Risk for impaired skin integrity will decrease 04/29/2021 1559 by Cristela Blue, RN Outcome: Progressing 04/29/2021 1559 by Cristela Blue, RN Outcome: Progressing

## 2021-04-29 NOTE — Discharge Instructions (Signed)
  WHY WAS ELIQUIS PRESCRIBED FOR YOU? Eliquis was prescribed for you to reduce the risk of a blood clot forming that can cause a stroke if you have a medical condition called atrial fibrillation (a type of irregular heartbeat). WHAT DO YOU NEED TO KNOW ABOUT ELIQUIS ? Take your Eliquis TWICE DAILY - one tablet in the morning and one tablet in the evening with or without food. If you have difficulty swallowing the tablet whole please discuss with your pharmacist how to take the medication safely. Take Eliquis exactly as prescribed by your doctor and DO NOT stop taking Eliquis without talking to the doctor who prescribed the medication. Stopping may increase your risk of developing a stroke. Refill your prescription before you run out. After discharge, you should have regular check-up appointments with your healthcare provider that is prescribing your Eliquis. In the future your dose may need to be changed if your kidney function or weight changes by a significant amount or as you get older. WHAT DO YOU DO IF YOU MISS A DOSE? If you miss a dose, take it as soon as you remember on the same day and resume taking twice daily. Do not take more than one dose of ELIQUIS at the same time to make up a missed dose. IMPORTANT SAFETY INFORMATION A possible side effect of Eliquis is bleeding. You should call your healthcare provider right away if you experience any of the following: ? Bleeding from an injury or your nose that does not stop. ? Unusual colored urine (red or dark brown) or unusual colored stools (red or black). ? Unusual bruising for unknown reasons. ? A serious fall or if you hit your head (even if there is no bleeding). Some medicines may interact with Eliquis and might increase your risk of bleeding or clotting while on Eliquis. To help avoid this, consult your healthcare provider or pharmacist prior to using any new prescription or non-prescription medications, including  herbals, vitamins, non-steroidal anti-inflammatory drugs (NSAIDs) and supplements. This website has more information on Eliquis (apixaban): [email or web address].

## 2021-04-30 ENCOUNTER — Telehealth: Payer: Self-pay

## 2021-04-30 DIAGNOSIS — I10 Essential (primary) hypertension: Secondary | ICD-10-CM

## 2021-04-30 MED ORDER — APIXABAN 5 MG PO TABS: 5.0000 mg | ORAL_TABLET | Freq: Two times a day (BID) | ORAL | 3 refills | Status: DC

## 2021-04-30 MED ORDER — DILTIAZEM HCL ER COATED BEADS 120 MG PO CP24: 120.0000 mg | ORAL_CAPSULE | Freq: Every day | ORAL | 3 refills | Status: DC

## 2021-04-30 NOTE — Discharge Summary (Signed)
Physician Discharge Summary  Tammy Manning NUU:[id number] DOB: 23-Jul-1935 DOA: 04/28/2021  PCP: Tammy Greenspan, MD  Admit date: 04/28/2021 Discharge date: 04/30/2021  Admitted From: Home  Disposition:  Home   Recommendations for Outpatient Follow-up:  1. Follow up with PCP Tammy Manning in 1-2 weeks 2. Follow up with Afib clinic as scheduled        Home Health: None  Equipment/Devices: None new  Discharge Condition: Good  CODE STATUS: FULL Diet recommendation: Cardiac  Brief/Interim Summary: Tammy Manning is an 86 y.o. F with recent diagnosis pAF, remote SVT and HTN who presented with chest tightness and palpitations.  In the ER, ECG showed atrial fibrillation with RVR, started on diltiazem infusion.     PRINCIPAL HOSPITAL DIAGNOSIS: New onset atrial fibrillation with RVR    Discharge Diagnoses:   New onset atrial fibrillation, paroxysmal Hypertension She was admitted and started on diltiazem drip.  Cardiology were consulted.  Her heart rhythm converted to normal sinus rhythm.  CT head ruled out intracranial bleeding, and she was started on Eliquis.  She was discharged to continue her ARB, but stop chlorthalidone and amlodipine and start diltiazem.  She was started on blood thinner.  Referred to Afib clinic.           Discharge Instructions  Discharge Instructions    Amb referral to AFIB Clinic   Complete by: As directed    Diet - low sodium heart healthy   Complete by: As directed    Discharge instructions   Complete by: As directed    From Dr. Loleta Manning: You were admitted for a fast and irregular heart beat called "A fib" or atrial fibrillation  You were started on a new medicine to slow the heart rate, called diltiazem This also lowers the blood pressure, so we recommend: STOP chlorthalidone STOP amlodipine START diltiazem 120 mg once daily  ALso, for stroke prevention (due to clots forming in the nooks and crannies of the atria) we recommend the  blood thinner apixaban/Eliquis 5 mg twice daily STOP baby aspirin, this is not necessary for you right now, given the Eliquis   Go see Tammy Manning at the Afib clinic They have sent a referral to the A Fib clinic.  If you haven't heard back from them in 1 week, call his office to inquire   Increase activity slowly   Complete by: As directed      Allergies as of 04/30/2021      Reactions   Antihistamines, Chlorpheniramine-type Other (See Comments)   Reaction:  Makes pt hyper    Atenolol Hypertension   Cefuroxime Axetil Other (See Comments)   Upset stomach   Ciprofloxacin Other (See Comments)   Upset stomach   Co Q 10 [coenzyme Q10] Other (See Comments)   Reaction:  GI upset    Crestor [rosuvastatin Calcium] Other (See Comments)   Reaction:  Myalgias   Dextromethorphan-guaifenesin Other (See Comments)   Reaction:  Made pt jittery    Epinephrine Hives   Ramipril Other (See Comments)   Upset stomach   Sulfamethoxazole-trimethoprim Other (See Comments)   Upset stomach   Vitamin D Analogs Other (See Comments)   Reaction:  GI upset       Medication List    STOP taking these medications   amLODipine 2.5 MG tablet Commonly known as: NORVASC   aspirin EC 81 MG tablet   chlorthalidone 25 MG tablet Commonly known as: HYGROTON     TAKE these medications   acetaminophen 500 MG  tablet Commonly known as: TYLENOL Take 500 mg by mouth every 6 (six) hours as needed.   apixaban 5 MG Tabs tablet Commonly known as: ELIQUIS Take 1 tablet (5 mg total) by mouth 2 (two) times daily.   atorvastatin 10 MG tablet Commonly known as: LIPITOR TAKE 1 TABLET BY MOUTH EVERY DAY AT 6 PM   CALCIUM 600+D PO Take 1 tablet by mouth daily.   clobetasol ointment 0.05 % Commonly known as: TEMOVATE Apply 1 application topically 2 (two) times daily as needed.   clorazepate 7.5 MG tablet Commonly known as: TRANXENE Take 1 tablet (7.5 mg total) by mouth daily as needed.   cyanocobalamin 1000 MCG/ML  injection Commonly known as: (VITAMIN B-12) Inject 1,000 mcg into the muscle every 30 (thirty) days.   diltiazem 120 MG 24 hr capsule Commonly known as: CARDIZEM CD Take 1 capsule (120 mg total) by mouth daily.   irbesartan 300 MG tablet Commonly known as: AVAPRO TAKE 1 TABLET BY MOUTH DAILY   meloxicam 15 MG tablet Commonly known as: MOBIC Take 1 tablet (15 mg total) by mouth daily as needed for pain. With food   omeprazole 20 MG capsule Commonly known as: PRILOSEC Take 1 capsule (20 mg total) by mouth 2 (two) times daily as needed.   triamcinolone 55 MCG/ACT Aero nasal inhaler Commonly known as: NASACORT Place 2 sprays into the nose daily.       Follow-up Information    Manning, Tammy Fanny, MD. Schedule an appointment as soon as possible for a visit in 1 week(s).   Specialties: Family Medicine, Radiology Contact information: [place] [address] [phone]        Tammy Hampshire, MD. Schedule an appointment as soon as possible for a visit in 1 week(s).   Specialty: Cardiology Contact information: [address] [place] [address] [phone]              Allergies  Allergen Reactions  . Antihistamines, Chlorpheniramine-Type Other (See Comments)    Reaction:  Makes pt hyper   . Atenolol Hypertension  . Cefuroxime Axetil Other (See Comments)    Upset stomach  . Ciprofloxacin Other (See Comments)    Upset stomach  . Co Q 10 [Coenzyme Q10] Other (See Comments)    Reaction:  GI upset   . Crestor [Rosuvastatin Calcium] Other (See Comments)    Reaction:  Myalgias   . Dextromethorphan-Guaifenesin Other (See Comments)    Reaction:  Made pt jittery   . Epinephrine Hives  . Ramipril Other (See Comments)    Upset stomach  . Sulfamethoxazole-Trimethoprim Other (See Comments)    Upset stomach  . Vitamin D Analogs Other (See Comments)    Reaction:  GI upset      Consultations:  Cardiology   Procedures/Studies: CT HEAD WO CONTRAST  Result Date: 04/29/2021 CLINICAL DATA:  Status post trauma. EXAM: CT HEAD WITHOUT CONTRAST TECHNIQUE: Contiguous axial images were obtained from the base of the skull through the vertex without intravenous contrast. COMPARISON:  May 09, 2018 FINDINGS: Brain: There is mild cerebral atrophy with widening of the extra-axial spaces and ventricular dilatation. There are areas of decreased attenuation within the white matter tracts of the supratentorial brain, consistent with microvascular disease changes. Vascular: No hyperdense vessel or unexpected calcification. Skull: Normal. Negative for fracture or focal lesion. Sinuses/Orbits: No acute finding. Other: None. IMPRESSION: 1. Generalized cerebral atrophy. 2. No acute intracranial abnormality. Electronically Signed   By: Hoover Browns  Houston M.D.   On: 04/29/2021 15:43   ECHOCARDIOGRAM COMPLETE  Result Date: 04/29/2021    ECHOCARDIOGRAM REPORT   Patient Name:   JERIE BASFORD Date of Exam: 04/29/2021 Medical Rec #:  [id number]        Height:       64.0 in Accession #:    [id number]       Weight:       286.6 lb Date of Birth:  1935-11-09         BSA:          2.280 m Patient Age:    54 years         BP:           121/57 mmHg Patient Gender: F                HR:           86 bpm. Exam Location:  [facility] Procedure: 2D Echo, Cardiac Doppler and Color Doppler Indications:     Chest pai R07.9  History:         Patient has prior history of Echocardiogram examinations, most                  recent 03/14/2020. Signs/Symptoms:Murmur; Risk                  Factors:Hypertension.  Sonographer:     Sherrie Sport RDCS (AE) Referring Phys:  [id number] Arvil Chaco Diagnosing Phys: Kathlyn Sacramento MD  Sonographer Comments: Suboptimal apical window. IMPRESSIONS  1. Left ventricular ejection fraction, by estimation, is 60 to 65%. The left ventricle has normal function. The left ventricle has no regional wall  motion abnormalities. There is mild left ventricular hypertrophy. Left ventricular diastolic parameters are consistent with Grade I diastolic dysfunction (impaired relaxation).  2. Right ventricular systolic function is normal. The right ventricular size is normal. There is normal pulmonary artery systolic pressure.  3. Left atrial size was mildly dilated.  4. The mitral valve is normal in structure. No evidence of mitral valve regurgitation. No evidence of mitral stenosis.  5. The aortic valve is normal in structure. Aortic valve regurgitation is not visualized. Mild to moderate aortic valve sclerosis/calcification is present, without any evidence of aortic stenosis.  6. The inferior vena cava is normal in size with greater than 50% respiratory variability, suggesting right atrial pressure of 3 mmHg. FINDINGS  Left Ventricle: Left ventricular ejection fraction, by estimation, is 60 to 65%. The left ventricle has normal function. The left ventricle has no regional wall motion abnormalities. The left ventricular internal cavity size was normal in size. There is  mild left ventricular hypertrophy. Left ventricular diastolic parameters are consistent with Grade I diastolic dysfunction (impaired relaxation). Right Ventricle: The right ventricular size is normal. No increase in right ventricular wall thickness. Right ventricular systolic function is normal. There is normal pulmonary artery systolic pressure. The tricuspid regurgitant velocity is 2.25 m/s, and  with an assumed right atrial pressure of 3 mmHg, the estimated right ventricular systolic pressure is 94.7 mmHg. Left Atrium: Left atrial size was mildly dilated. Right Atrium: Right atrial size was normal in size. Pericardium: There is no evidence of pericardial effusion. Mitral Valve: The mitral valve is normal in structure. No evidence of mitral valve regurgitation. No evidence of mitral valve stenosis. Tricuspid Valve: The tricuspid valve is normal in  structure. Tricuspid valve regurgitation is not demonstrated. No evidence of tricuspid stenosis. Aortic Valve: The aortic valve is normal  in structure. Aortic valve regurgitation is not visualized. Mild to moderate aortic valve sclerosis/calcification is present, without any evidence of aortic stenosis. Aortic valve mean gradient measures 4.0 mmHg. Aortic valve peak gradient measures 6.7 mmHg. Aortic valve area, by VTI measures 3.37 cm. Pulmonic Valve: The pulmonic valve was normal in structure. Pulmonic valve regurgitation is mild. No evidence of pulmonic stenosis. Aorta: The aortic root is normal in size and structure. Venous: The inferior vena cava is normal in size with greater than 50% respiratory variability, suggesting right atrial pressure of 3 mmHg. IAS/Shunts: No atrial level shunt detected by color flow Doppler.  LEFT VENTRICLE PLAX 2D LVIDd:         4.17 cm  Diastology LVIDs:         2.54 cm  LV e' medial:    6.31 cm/s LV PW:         1.32 cm  LV E/e' medial:  10.9 LV IVS:        0.87 cm  LV e' lateral:   11.30 cm/s LVOT diam:     2.10 cm  LV E/e' lateral: 6.1 LV SV:         72 LV SV Index:   32 LVOT Area:     3.46 cm  RIGHT VENTRICLE RV Basal diam:  2.48 cm RV S prime:     14.90 cm/s TAPSE (M-mode): 4.3 cm LEFT ATRIUM             Index       RIGHT ATRIUM           Index LA diam:        4.90 cm 2.15 cm/m  RA Area:     11.10 cm LA Vol (A2C):   86.0 ml 37.73 ml/m RA Volume:   20.70 ml  9.08 ml/m LA Vol (A4C):   39.9 ml 17.50 ml/m LA Biplane Vol: 62.2 ml 27.29 ml/m  AORTIC VALVE                   PULMONIC VALVE AV Area (Vmax):    3.00 cm    PV Vmax:        0.31 m/s AV Area (Vmean):   3.05 cm    PV Peak grad:   0.4 mmHg AV Area (VTI):     3.37 cm    RVOT Peak grad: 2 mmHg AV Vmax:           129.50 cm/s AV Vmean:          88.900 cm/s AV VTI:            0.214 m AV Peak Grad:      6.7 mmHg AV Mean Grad:      4.0 mmHg LVOT Vmax:         112.00 cm/s LVOT Vmean:        78.300 cm/s LVOT VTI:           0.208 m LVOT/AV VTI ratio: 0.97  AORTA Ao Root diam: 3.30 cm MITRAL VALVE               TRICUSPID VALVE MV Area (PHT): 3.72 cm    TR Peak grad:   20.2 mmHg MV Decel Time: 204 msec    TR Vmax:        225.00 cm/s MV E velocity: 68.60 cm/s MV A velocity: 98.50 cm/s  SHUNTS MV E/A ratio:  0.70        Systemic VTI:  0.21 m  Systemic Diam: 2.10 cm Kathlyn Sacramento MD Electronically signed by Kathlyn Sacramento MD Signature Date/Time: 04/29/2021/3:38:00 PM    Final        Subjective: No chest discomfort, palpitations, dyspnea.  No orthopnea, leg swelling.  No confusion.  Discharge Exam: Vitals:   04/30/21 0355 04/30/21 0715  BP: (!) 142/65 (!) 118/59  Pulse: 64 73  Resp: 18 16  Temp: 98.5 F (36.9 C) 97.9 F (36.6 C)  SpO2: 100% 99%   Vitals:   04/29/21 1923 04/29/21 2014 04/30/21 0355 04/30/21 0715  BP: (!) 149/61 140/78 (!) 142/65 (!) 118/59  Pulse: 74 73 64 73  Resp: 18 18 18 16   Temp: 98.1 F (36.7 C) 97.9 F (36.6 C) 98.5 F (36.9 C) 97.9 F (36.6 C)  TempSrc: Oral     SpO2: 98% 100% 100% 99%  Weight:      Height:        General: Pt is alert, awake, not in acute distress Cardiovascular: RRR, nl S1-S2, no murmurs appreciated.   No LE edema.   Respiratory: Normal respiratory rate and rhythm.  CTAB without rales or wheezes. Abdominal: Abdomen soft and non-tender.  No distension or HSM.   Neuro/Psych: Strength symmetric in upper and lower extremities.  Judgment and insight appear normal.   The results of significant diagnostics from this hospitalization (including imaging, microbiology, ancillary and laboratory) are listed below for reference.     Microbiology: Recent Results (from the past 240 hour(s))  Resp Panel by RT-PCR (Flu A&B, Covid) Nasopharyngeal Swab     Status: None   Collection Time: 04/28/21  9:54 PM   Specimen: Nasopharyngeal Swab; Nasopharyngeal(NP) swabs in vial transport medium  Result Value Ref Range Status   SARS Coronavirus 2 by RT  PCR NEGATIVE NEGATIVE Final    Comment: (NOTE) SARS-CoV-2 target nucleic acids are NOT DETECTED.  The SARS-CoV-2 RNA is generally detectable in upper respiratory specimens during the acute phase of infection. The lowest concentration of SARS-CoV-2 viral copies this assay can detect is 138 copies/mL. A negative result does not preclude SARS-Cov-2 infection and should not be used as the sole basis for treatment or other patient management decisions. A negative result may occur with  improper specimen collection/handling, submission of specimen other than nasopharyngeal swab, presence of viral mutation(s) within the areas targeted by this assay, and inadequate number of viral copies(<138 copies/mL). A negative result must be combined with clinical observations, patient history, and epidemiological information. The expected result is Negative.  Fact Sheet for Patients:  EntrepreneurPulse.com.au  Fact Sheet for Healthcare Providers:  IncredibleEmployment.be  This test is no t yet approved or cleared by the [place] FDA and  has been authorized for detection and/or diagnosis of SARS-CoV-2 by FDA under an Emergency Use Authorization (EUA). This EUA will remain  in effect (meaning this test can be used) for the duration of the COVID-19 declaration under Section 564(b)(1) of the Act, 21 U.S.C.section 360bbb-3(b)(1), unless the authorization is terminated  or revoked sooner.       Influenza A by PCR NEGATIVE NEGATIVE Final   Influenza B by PCR NEGATIVE NEGATIVE Final    Comment: (NOTE) The Xpert Xpress SARS-CoV-2/FLU/RSV plus assay is intended as an aid in the diagnosis of influenza from Nasopharyngeal swab specimens and should not be used as a sole basis for treatment. Nasal washings and aspirates are unacceptable for Xpert Xpress SARS-CoV-2/FLU/RSV testing.  Fact Sheet for Patients: EntrepreneurPulse.com.au  Fact Sheet for  Healthcare Providers: IncredibleEmployment.be  This test  is not yet approved or cleared by the [place] and has been authorized for detection and/or diagnosis of SARS-CoV-2 by FDA under an Emergency Use Authorization (EUA). This EUA will remain in effect (meaning this test can be used) for the duration of the COVID-19 declaration under Section 564(b)(1) of the Act, 21 U.S.C. section 360bbb-3(b)(1), unless the authorization is terminated or revoked.  Performed at [facility], [address] [place], [place] [address]      Labs: BNP (last 3 results) Recent Labs    03/20/21 1526  BNP 41.7   Basic Metabolic Panel: Recent Labs  Lab 04/28/21 1825 04/28/21 2030 04/29/21 0723  NA 134*  --  140  K 3.5  --  4.3  CL 103  --  110  CO2 23  --  19*  GLUCOSE 144*  --  102*  BUN 24*  --  17  CREATININE 1.02*  --  0.75  CALCIUM 8.8*  --  9.2  MG  --  1.7  --    Liver Function Tests: Recent Labs  Lab 04/28/21 1825  AST 17  ALT 12  ALKPHOS 56  BILITOT 0.7  PROT 6.5  ALBUMIN 3.6   No results for input(s): LIPASE, AMYLASE in the last 168 hours. No results for input(s): AMMONIA in the last 168 hours. CBC: Recent Labs  Lab 04/28/21 1825 04/29/21 0723  WBC 10.8* 10.1  HGB 11.2* 11.9*  HCT 34.3* 35.7*  MCV 93.5 92.0  PLT 368 376   Cardiac Enzymes: No results for input(s): CKTOTAL, CKMB, CKMBINDEX, TROPONINI in the last 168 hours. BNP: Invalid input(s): POCBNP CBG: No results for input(s): GLUCAP in the last 168 hours. D-Dimer No results for input(s): DDIMER in the last 72 hours. Hgb A1c No results for input(s): HGBA1C in the last 72 hours. Lipid Profile No results for input(s): CHOL, HDL, LDLCALC, TRIG, CHOLHDL, LDLDIRECT in the last 72 hours. Thyroid function studies No results for input(s): TSH, T4TOTAL, T3FREE, THYROIDAB in the last 72 hours.  Invalid input(s): FREET3 Anemia work up No results for input(s): VITAMINB12,  FOLATE, FERRITIN, TIBC, IRON, RETICCTPCT in the last 72 hours. Urinalysis    Component Value Date/Time   COLORURINE STRAW (A) 05/09/2018 1639   APPEARANCEUR CLEAR (A) 05/09/2018 1639   LABSPEC 1.003 (L) 05/09/2018 1639   PHURINE 6.0 05/09/2018 1639   GLUCOSEU NEGATIVE 05/09/2018 1639   HGBUR SMALL (A) 05/09/2018 1639   BILIRUBINUR NEGATIVE 05/09/2018 1639   BILIRUBINUR negative 02/16/2018 1600   KETONESUR NEGATIVE 05/09/2018 1639   PROTEINUR NEGATIVE 05/09/2018 1639   UROBILINOGEN 0.2 02/16/2018 1600   NITRITE NEGATIVE 05/09/2018 1639   LEUKOCYTESUR TRACE (A) 05/09/2018 1639   Sepsis Labs Invalid input(s): PROCALCITONIN,  WBC,  LACTICIDVEN Microbiology Recent Results (from the past 240 hour(s))  Resp Panel by RT-PCR (Flu A&B, Covid) Nasopharyngeal Swab     Status: None   Collection Time: 04/28/21  9:54 PM   Specimen: Nasopharyngeal Swab; Nasopharyngeal(NP) swabs in vial transport medium  Result Value Ref Range Status   SARS Coronavirus 2 by RT PCR NEGATIVE NEGATIVE Final    Comment: (NOTE) SARS-CoV-2 target nucleic acids are NOT DETECTED.  The SARS-CoV-2 RNA is generally detectable in upper respiratory specimens during the acute phase of infection. The lowest concentration of SARS-CoV-2 viral copies this assay can detect is 138 copies/mL. A negative result does not preclude SARS-Cov-2 infection and should not be used as the sole basis for treatment or other patient management decisions. A negative  result may occur with  improper specimen collection/handling, submission of specimen other than nasopharyngeal swab, presence of viral mutation(s) within the areas targeted by this assay, and inadequate number of viral copies(<138 copies/mL). A negative result must be combined with clinical observations, patient history, and epidemiological information. The expected result is Negative.  Fact Sheet for Patients:  EntrepreneurPulse.com.au  Fact Sheet for  Healthcare Providers:  IncredibleEmployment.be  This test is no t yet approved or cleared by the [place] FDA and  has been authorized for detection and/or diagnosis of SARS-CoV-2 by FDA under an Emergency Use Authorization (EUA). This EUA will remain  in effect (meaning this test can be used) for the duration of the COVID-19 declaration under Section 564(b)(1) of the Act, 21 U.S.C.section 360bbb-3(b)(1), unless the authorization is terminated  or revoked sooner.       Influenza A by PCR NEGATIVE NEGATIVE Final   Influenza B by PCR NEGATIVE NEGATIVE Final    Comment: (NOTE) The Xpert Xpress SARS-CoV-2/FLU/RSV plus assay is intended as an aid in the diagnosis of influenza from Nasopharyngeal swab specimens and should not be used as a sole basis for treatment. Nasal washings and aspirates are unacceptable for Xpert Xpress SARS-CoV-2/FLU/RSV testing.  Fact Sheet for Patients: EntrepreneurPulse.com.au  Fact Sheet for Healthcare Providers: IncredibleEmployment.be  This test is not yet approved or cleared by the [place] FDA and has been authorized for detection and/or diagnosis of SARS-CoV-2 by FDA under an Emergency Use Authorization (EUA). This EUA will remain in effect (meaning this test can be used) for the duration of the COVID-19 declaration under Section 564(b)(1) of the Act, 21 U.S.C. section 360bbb-3(b)(1), unless the authorization is terminated or revoked.  Performed at [facility], [address] [place], [place] [address]      Time coordinating discharge: 35 minutes The [place] controlled substances registry was reviewed for this patient     30 Day Unplanned Readmission Risk Score   Flowsheet Row ED to Hosp-Admission (Current) from 04/28/2021 in [place] [facility]  30 Day Unplanned Readmission Risk Score (%) 9.17 Filed at 04/30/2021 0801     This score is the patient's risk of  an unplanned readmission within 30 days of being discharged (0 -100%). The score is based on dignosis, age, lab data, medications, orders, and past utilization.   Low:  0-14.9   Medium: 15-21.9   High: 22-29.9   Extreme: 30 and above           SIGNED:   Edwin Dada, MD  [facility] 04/30/2021, 10:34 AM

## 2021-04-30 NOTE — Telephone Encounter (Signed)
Transition Care Management Unsuccessful Follow-up Telephone Call  Date of discharge and from where:  04/30/2021, [facility]  Attempts:  1st Attempt  Reason for unsuccessful TCM follow-up call:  Left voice message

## 2021-04-30 NOTE — Progress Notes (Addendum)
Progress Note  Patient Name: Tammy Manning Date of Encounter: 04/30/2021  [facility] Cardiologist: Kathlyn Sacramento, MD   Subjective   Feels well, denies chest pain or shortness of breath.  Denies palpitations.  Inpatient Medications    Scheduled Meds: . apixaban  5 mg Oral BID  . atorvastatin  10 mg Oral q1800  . calcium-vitamin D  1 tablet Oral Daily  . [START ON 05/28/2021] cyanocobalamin  1,000 mcg Intramuscular Q30 days  . diltiazem  120 mg Oral Daily  . irbesartan  300 mg Oral Daily  . pantoprazole  40 mg Oral Daily  . triamcinolone  2 spray Nasal Daily   Continuous Infusions: . diltiazem (CARDIZEM) infusion Stopped (04/29/21 0953)   PRN Meds: acetaminophen, ALPRAZolam, alum & mag hydroxide-simeth, clorazepate, docusate sodium, ondansetron (ZOFRAN) IV, zolpidem   Vital Signs    Vitals:   04/29/21 1923 04/29/21 2014 04/30/21 0355 04/30/21 0715  BP: (!) 149/61 140/78 (!) 142/65 (!) 118/59  Pulse: 74 73 64 73  Resp: 18 18 18 16   Temp: 98.1 F (36.7 C) 97.9 F (36.6 C) 98.5 F (36.9 C) 97.9 F (36.6 C)  TempSrc: Oral     SpO2: 98% 100% 100% 99%  Weight:      Height:        Intake/Output Summary (Last 24 hours) at 04/30/2021 1021 Last data filed at 04/30/2021 0745 Gross per 24 hour  Intake 461.73 ml  Output --  Net 461.73 ml   Last 3 Weights 04/29/2021 04/28/2021 04/22/2021  Weight (lbs) 286 lb 9.6 oz 285 lb 187 lb 9.6 oz  Weight (kg) 130 kg 129.275 kg 85.095 kg      Telemetry    Sinus rhythm, heart rate 69- Personally Reviewed  ECG    No new tracing- Personally Reviewed  Physical Exam   GEN: No acute distress.   Neck: No JVD Cardiac: RRR  Respiratory: Clear to auscultation bilaterally. GI: Soft, nontender, non-distended  MS: No edema; No deformity. Neuro:  Nonfocal  Psych: Normal affect   Labs    High Sensitivity Troponin:   Recent Labs  Lab 04/28/21 1825 04/28/21 2030  TROPONINIHS 7 17      Chemistry Recent Labs  Lab  04/28/21 1825 04/29/21 0723  NA 134* 140  K 3.5 4.3  CL 103 110  CO2 23 19*  GLUCOSE 144* 102*  BUN 24* 17  CREATININE 1.02* 0.75  CALCIUM 8.8* 9.2  PROT 6.5  --   ALBUMIN 3.6  --   AST 17  --   ALT 12  --   ALKPHOS 56  --   BILITOT 0.7  --   GFRNONAA 54* >60  ANIONGAP 8 11     Hematology Recent Labs  Lab 04/28/21 1825 04/29/21 0723  WBC 10.8* 10.1  RBC 3.67* 3.88  HGB 11.2* 11.9*  HCT 34.3* 35.7*  MCV 93.5 92.0  MCH 30.5 30.7  MCHC 32.7 33.3  RDW 14.1 14.5  PLT 368 376    BNPNo results for input(s): BNP, PROBNP in the last 168 hours.   DDimer No results for input(s): DDIMER in the last 168 hours.   Radiology    CT HEAD WO CONTRAST  Result Date: 04/29/2021 CLINICAL DATA:  Status post trauma. EXAM: CT HEAD WITHOUT CONTRAST TECHNIQUE: Contiguous axial images were obtained from the base of the skull through the vertex without intravenous contrast. COMPARISON:  May 09, 2018 FINDINGS: Brain: There is mild cerebral atrophy with widening of the extra-axial spaces and  ventricular dilatation. There are areas of decreased attenuation within the white matter tracts of the supratentorial brain, consistent with microvascular disease changes. Vascular: No hyperdense vessel or unexpected calcification. Skull: Normal. Negative for fracture or focal lesion. Sinuses/Orbits: No acute finding. Other: None. IMPRESSION: 1. Generalized cerebral atrophy. 2. No acute intracranial abnormality. Electronically Signed   By: Virgina Norfolk M.D.   On: 04/29/2021 15:43   ECHOCARDIOGRAM COMPLETE  Result Date: 04/29/2021    ECHOCARDIOGRAM REPORT   Patient Name:   Tammy Manning Date of Exam: 04/29/2021 Medical Rec #:  [id number]        Height:       64.0 in Accession #:    [id number]       Weight:       286.6 lb Date of Birth:  12/31/1934         BSA:          2.280 m Patient Age:    86 years         BP:           121/57 mmHg Patient Gender: F                HR:           86 bpm. Exam Location:  [facility]  Procedure: 2D Echo, Cardiac Doppler and Color Doppler Indications:     Chest pai R07.9  History:         Patient has prior history of Echocardiogram examinations, most                  recent 03/14/2020. Signs/Symptoms:Murmur; Risk                  Factors:Hypertension.  Sonographer:     Sherrie Sport RDCS (AE) Referring Phys:  [id number] Arvil Chaco Diagnosing Phys: Kathlyn Sacramento MD  Sonographer Comments: Suboptimal apical window. IMPRESSIONS  1. Left ventricular ejection fraction, by estimation, is 60 to 65%. The left ventricle has normal function. The left ventricle has no regional wall motion abnormalities. There is mild left ventricular hypertrophy. Left ventricular diastolic parameters are consistent with Grade I diastolic dysfunction (impaired relaxation).  2. Right ventricular systolic function is normal. The right ventricular size is normal. There is normal pulmonary artery systolic pressure.  3. Left atrial size was mildly dilated.  4. The mitral valve is normal in structure. No evidence of mitral valve regurgitation. No evidence of mitral stenosis.  5. The aortic valve is normal in structure. Aortic valve regurgitation is not visualized. Mild to moderate aortic valve sclerosis/calcification is present, without any evidence of aortic stenosis.  6. The inferior vena cava is normal in size with greater than 50% respiratory variability, suggesting right atrial pressure of 3 mmHg. FINDINGS  Left Ventricle: Left ventricular ejection fraction, by estimation, is 60 to 65%. The left ventricle has normal function. The left ventricle has no regional wall motion abnormalities. The left ventricular internal cavity size was normal in size. There is  mild left ventricular hypertrophy. Left ventricular diastolic parameters are consistent with Grade I diastolic dysfunction (impaired relaxation). Right Ventricle: The right ventricular size is normal. No increase in right ventricular wall thickness. Right ventricular  systolic function is normal. There is normal pulmonary artery systolic pressure. The tricuspid regurgitant velocity is 2.25 m/s, and  with an assumed right atrial pressure of 3 mmHg, the estimated right ventricular systolic pressure is 82.5 mmHg. Left Atrium: Left atrial size was mildly dilated. Right Atrium: Right atrial size  was normal in size. Pericardium: There is no evidence of pericardial effusion. Mitral Valve: The mitral valve is normal in structure. No evidence of mitral valve regurgitation. No evidence of mitral valve stenosis. Tricuspid Valve: The tricuspid valve is normal in structure. Tricuspid valve regurgitation is not demonstrated. No evidence of tricuspid stenosis. Aortic Valve: The aortic valve is normal in structure. Aortic valve regurgitation is not visualized. Mild to moderate aortic valve sclerosis/calcification is present, without any evidence of aortic stenosis. Aortic valve mean gradient measures 4.0 mmHg. Aortic valve peak gradient measures 6.7 mmHg. Aortic valve area, by VTI measures 3.37 cm. Pulmonic Valve: The pulmonic valve was normal in structure. Pulmonic valve regurgitation is mild. No evidence of pulmonic stenosis. Aorta: The aortic root is normal in size and structure. Venous: The inferior vena cava is normal in size with greater than 50% respiratory variability, suggesting right atrial pressure of 3 mmHg. IAS/Shunts: No atrial level shunt detected by color flow Doppler.  LEFT VENTRICLE PLAX 2D LVIDd:         4.17 cm  Diastology LVIDs:         2.54 cm  LV e' medial:    6.31 cm/s LV PW:         1.32 cm  LV E/e' medial:  10.9 LV IVS:        0.87 cm  LV e' lateral:   11.30 cm/s LVOT diam:     2.10 cm  LV E/e' lateral: 6.1 LV SV:         72 LV SV Index:   32 LVOT Area:     3.46 cm  RIGHT VENTRICLE RV Basal diam:  2.48 cm RV S prime:     14.90 cm/s TAPSE (M-mode): 4.3 cm LEFT ATRIUM             Index       RIGHT ATRIUM           Index LA diam:        4.90 cm 2.15 cm/m  RA Area:      11.10 cm LA Vol (A2C):   86.0 ml 37.73 ml/m RA Volume:   20.70 ml  9.08 ml/m LA Vol (A4C):   39.9 ml 17.50 ml/m LA Biplane Vol: 62.2 ml 27.29 ml/m  AORTIC VALVE                   PULMONIC VALVE AV Area (Vmax):    3.00 cm    PV Vmax:        0.31 m/s AV Area (Vmean):   3.05 cm    PV Peak grad:   0.4 mmHg AV Area (VTI):     3.37 cm    RVOT Peak grad: 2 mmHg AV Vmax:           129.50 cm/s AV Vmean:          88.900 cm/s AV VTI:            0.214 m AV Peak Grad:      6.7 mmHg AV Mean Grad:      4.0 mmHg LVOT Vmax:         112.00 cm/s LVOT Vmean:        78.300 cm/s LVOT VTI:          0.208 m LVOT/AV VTI ratio: 0.97  AORTA Ao Root diam: 3.30 cm MITRAL VALVE               TRICUSPID VALVE MV Area (PHT):  3.72 cm    TR Peak grad:   20.2 mmHg MV Decel Time: 204 msec    TR Vmax:        225.00 cm/s MV E velocity: 68.60 cm/s MV A velocity: 98.50 cm/s  SHUNTS MV E/A ratio:  0.70        Systemic VTI:  0.21 m                            Systemic Diam: 2.10 cm Kathlyn Sacramento MD Electronically signed by Kathlyn Sacramento MD Signature Date/Time: 04/29/2021/3:38:00 PM    Final     Cardiac Studies   TTEcho 04/29/2021 1. Left ventricular ejection fraction, by estimation, is 60 to 65%. The  left ventricle has normal function. The left ventricle has no regional  wall motion abnormalities. There is mild left ventricular hypertrophy.  Left ventricular diastolic parameters  are consistent with Grade I diastolic dysfunction (impaired relaxation).  2. Right ventricular systolic function is normal. The right ventricular  size is normal. There is normal pulmonary artery systolic pressure.  3. Left atrial size was mildly dilated.  4. The mitral valve is normal in structure. No evidence of mitral valve  regurgitation. No evidence of mitral stenosis.  5. The aortic valve is normal in structure. Aortic valve regurgitation is  not visualized. Mild to moderate aortic valve sclerosis/calcification is  present, without any evidence of  aortic stenosis.  6. The inferior vena cava is normal in size with greater than 50%  respiratory variability, suggesting right atrial pressure of 3 mmHg.   Patient Profile     86 y.o. female with history of hypertension, hyperlipidemia, presenting with palpitations being seen due to A. fib RVR.  Assessment & Plan    1.  Paroxysmal atrial fibrillation with rapid ventricular response -Currently in sinus rhythm after IV Cardizem -Continue p.o. Cardizem 154m qd, Eliquis 5 mg twice daily -Echocardiogram with preserved ejection fraction -Asymptomatic currently -Can be discharged from a cardiac perspective on current medicines.  2.  Hypertension -Irbesartan, Cardizem  Total encounter time 35 minutes  Greater than 50% was spent in counseling and coordination of care with the patient    Signed, BKate Sable MD  04/30/2021, 10:21 AM

## 2021-04-30 NOTE — Progress Notes (Signed)
Wells Guiles, Student Nurse provided discharge instructions and teaching to the patient under this RN's superversion. The patient verbalized and demonstrated an understanding of provided instructions. All outstanding questions resolved. R an L arm PIV removed. Both cannulas intact. Pt tolerated well. All belongings packed and in tow. Transport en route to transfer patient to private vehicle via wheelchair.

## 2021-05-01 NOTE — Telephone Encounter (Signed)
Transition Care Management Follow-up Telephone Call Date of discharge and from where: 04/30/2021, [facility] How have you been since you were released from the hospital? Patient is doing fine. She has no complaints at this time. Any questions or concerns? No  Items Reviewed: Did the pt receive and understand the discharge instructions provided? Yes  Medications obtained and verified? Yes  Other? No  Any new allergies since your discharge? No  Dietary orders reviewed? Yes Do you have support at home? Yes   Home Care and Equipment/Supplies: Were home health services ordered? not applicable If so, what is the name of the agency? N/A  Has the agency set up a time to come to the patient's home? not applicable Were any new equipment or medical supplies ordered?  No What is the name of the medical supply agency? N/A Were you able to get the supplies/equipment? not applicable Do you have any questions related to the use of the equipment or supplies? No  Functional Questionnaire: (I = Independent and D = Dependent) ADLs: I  Bathing/Dressing- I  Meal Prep- I  Eating- I  Maintaining continence- I  Transferring/Ambulation- I  Managing Meds- I  Follow up appointments reviewed:  PCP Hospital f/u appt confirmed? Yes  Scheduled to see Dr. Glori Bickers on 05/07/2021 @ 12 pm. [facility] f/u appt confirmed? Yes  Scheduled to see cardiology on 06/05/2021 @ 11 am. Are transportation arrangements needed? No  If their condition worsens, is the pt aware to call PCP or go to the Emergency Dept.? Yes Was the patient provided with contact information for the PCP's office or ED? Yes Was to pt encouraged to call back with questions or concerns? Yes

## 2021-05-07 ENCOUNTER — Encounter: Payer: Self-pay | Admitting: Family Medicine

## 2021-05-07 ENCOUNTER — Ambulatory Visit (INDEPENDENT_AMBULATORY_CARE_PROVIDER_SITE_OTHER): Payer: Medicare Other | Admitting: Family Medicine

## 2021-05-07 ENCOUNTER — Other Ambulatory Visit: Payer: Self-pay

## 2021-05-07 VITALS — BP 124/66 | HR 77 | Temp 96.9°F | Ht 64.0 in | Wt 185.5 lb

## 2021-05-07 DIAGNOSIS — E7800 Pure hypercholesterolemia, unspecified: Secondary | ICD-10-CM

## 2021-05-07 DIAGNOSIS — I10 Essential (primary) hypertension: Secondary | ICD-10-CM | POA: Diagnosis not present

## 2021-05-07 DIAGNOSIS — I480 Paroxysmal atrial fibrillation: Secondary | ICD-10-CM | POA: Diagnosis not present

## 2021-05-07 MED ORDER — DILTIAZEM HCL ER COATED BEADS 120 MG PO CP24: 120.0000 mg | ORAL_CAPSULE | Freq: Every day | ORAL | 0 refills | Status: DC

## 2021-05-07 MED ORDER — APIXABAN 5 MG PO TABS: 5.0000 mg | ORAL_TABLET | Freq: Two times a day (BID) | ORAL | 0 refills | Status: DC

## 2021-05-07 NOTE — Patient Instructions (Signed)
Be careful not to fall or cut yourself  Continue current medicines   BP and pulse look good  Watch for palpitations or any other changes   So glad you are doing well   Put off the knee replacement for now

## 2021-05-07 NOTE — Assessment & Plan Note (Signed)
bp in fair control at this time  BP Readings from Last 1 Encounters:  05/07/21 124/66   No changes needed Most recent labs reviewed  Disc lifstyle change with low sodium diet and exercise  Not taking diltiazem cd 120 mg daily and irbesartan 300 mg daily

## 2021-05-07 NOTE — Progress Notes (Signed)
Subjective:    Patient ID: Tammy Manning, female    DOB: [birth year], 86 y.o.   MRN: [id number]  This visit occurred during the SARS-CoV-2 public health emergency.  Safety protocols were in place, including screening questions prior to the visit, additional usage of staff PPE, and extensive cleaning of exam room while observing appropriate contact time as indicated for disinfecting solutions.   HPI Pt presents for f/u of hosp for a fib  Wt Readings from Last 3 Encounters:  05/07/21 185 lb 8 oz (84.1 kg)  04/29/21 286 lb 9.6 oz (130 kg)  04/22/21 187 lb 9.6 oz (85.1 kg)   31.84 kg/m  Pt was hosp from 6/6 to 04/30/21  She presented on day of admission with palpitations  Ekg showed a fib with RVR and she was started on a diltiazem infusion   A/P as planned from disc summary New onset atrial fibrillation, paroxysmal Hypertension She was admitted and started on diltiazem drip.  Cardiology were consulted.  Her heart rhythm converted to normal sinus rhythm.  CT head ruled out intracranial bleeding, and she was started on Eliquis.   She was discharged to continue her ARB, but stop chlorthalidone and amlodipine and start diltiazem.  She was started on blood thinner.   Referred to Afib clinic.  Taking eliquis 5 mg bid    Echocardiogram was done 6/7:  EF 60-65% No regional wall motion abn Grade 1 DD noted Mild L atrial dilation Nl valve appearances    Has f/u with cardiology in mid July  Today she feels ok   Occ little flutter or palpitation at night   Mother and sibs had a fib      BP Readings from Last 3 Encounters:  05/07/21 124/66  04/30/21 136/64  04/22/21 135/77   Pulse Readings from Last 3 Encounters:  05/07/21 77  04/30/21 75  04/22/21 66   Taking Cardizem cd 120 mg daily  Avapro 300 mg daily   Initially labile bp and now good     Hyperlipidemia Lab Results  Component Value Date   CHOL 201 (H) 04/25/2020   HDL 66.80 04/25/2020   LDLCALC 107 (H)  04/25/2020   LDLDIRECT 106 (H) 02/02/2020   TRIG 132.0 04/25/2020   CHOLHDL 3 04/25/2020   Atorvastatin 10 mg daily  Lab Results  Component Value Date   CREATININE 0.75 04/29/2021   BUN 17 04/29/2021   NA 140 04/29/2021   K 4.3 04/29/2021   CL 110 04/29/2021   CO2 19 (L) 04/29/2021   Lab Results  Component Value Date   WBC 10.1 04/29/2021   HGB 11.9 (L) 04/29/2021   HCT 35.7 (L) 04/29/2021   MCV 92.0 04/29/2021   PLT 376 04/29/2021   Was planning a total knee replacement That is on the back burner   Patient Active Problem List   Diagnosis Date Noted   Atrial fibrillation with RVR ([place]) 04/28/2021   Hypertrophic toenail 04/22/2021   Cough 03/24/2021   Pedal edema 03/24/2021   Leg pain, left 03/24/2021   Elevated rheumatoid factor 11/25/2020   Bilateral hand pain 11/25/2020   Pain in right shoulder 10/16/2020   Joint pain 10/16/2020   Joint swelling 10/16/2020   Estrogen deficiency 04/30/2020   Low back pain 03/15/2020   Dry mouth 03/15/2020   Osteoarthritis of right hip 01/02/2020   Groin pain, right 01/01/2020   Trochanteric bursitis of right hip 11/09/2018   Transient global amnesia 05/12/2018   Right thyroid nodule 05/12/2018  History of TIA (transient ischemic attack) 05/09/2018   Aortic insufficiency 09/10/2015   Stress reaction 09/08/2015   Encounter for Medicare annual wellness exam 02/28/2013   Bucket-handle tear of lateral meniscus of left knee as current injury 12/30/2012   Episodic atrial fibrillation ([place]) 11/17/2012   Special screening for malignant neoplasms, colon 01/21/2012   Hearing loss of both ears 01/14/2012   Adverse drug reaction 03/12/2011   HEADACHE, CHRONIC 09/24/2010   B12 deficiency 03/17/2010   DYSPEPSIA 03/17/2010   POSTMENOPAUSAL STATUS 08/20/2008   Essential hypertension 07/19/2007   Hyperlipidemia 06/24/2007   CONSTIPATION 06/24/2007   IBS 06/24/2007   DERMATITIS, ATOPIC 06/24/2007   SKIN CANCER, HX OF 06/24/2007    Past Medical History:  Diagnosis Date   Anginal pain ([place])    Aortic insufficiency    a. 02/2020 Echo: EF 60-65%, no rwma, Gr1 DD, nl RV fxn, mildly dil LA, triv AI, Asc Ao 94m.   Arthritis    Knees   B12 deficiency    Mild   Bucket-handle tear of lateral meniscus of left knee as current injury 12/30/2012   Cancer ([place]    Skin, basal cell on nose and eyelid   DJD (degenerative joint disease)    Low back   Dyspepsia    GERD (gastroesophageal reflux disease)    Heart murmur    Heart palpitations    History of cardiac cath    a. 02/2017 Cath: Nl cors. Nl LV fxn. Abd Aogram w/o RAS.   HOH (hard of hearing)    Hyperlipidemia    Hypertension    Lactose intolerance    Mixed incontinence    PONV (postoperative nausea and vomiting)    Vulvar irritation    from urine pad, uses Triamcinolone oint.   Past Surgical History:  Procedure Laterality Date   ABDOMINAL AORTOGRAM N/A 03/15/2017   Procedure: Abdominal Aortogram;  Surgeon: MWellington Hampshire MD;  [facility];  Service: Cardiovascular;  Laterality: N/A;   ABDOMINAL HYSTERECTOMY  1982   Large fibroids and bleeding   APPENDECTOMY     BILATERAL SALPINGOOPHORECTOMY  1986   BONE GRAFT HIP ILIAC CREST     To Left arm   BREAST REDUCTION SURGERY     BREAST SURGERY     CARDIAC CATHETERIZATION     CATARACT EXTRACTION W/PHACO Left 07/07/2016   Procedure: CATARACT EXTRACTION PHACO AND INTRAOCULAR LENS PLACEMENT (IOC);  Surgeon: WBirder Robson MD;  Location: [facility];  Service: Ophthalmology;  Laterality: Left;  [place]:10 AP% 18.3 CDE 12.91 Fluid pak lot # [id number]   CATARACT EXTRACTION W/PHACO Right 07/28/2016   Procedure: CATARACT EXTRACTION PHACO AND INTRAOCULAR LENS PLACEMENT (IOC);  Surgeon: WBirder Robson MD;  Location: [facility];  Service: Ophthalmology;  Laterality: Right;  [place]01:26 AP% 18.5 CDE 16.12 Fluid pack lot # [id number]   COLONOSCOPY  07/2016   Dr. MThana Farr  DILATION AND CURETTAGE OF UTERUS      miscarragesx2   ESOPHAGOGASTRODUODENOSCOPY     Polyps   HEMORRHOID SURGERY  2006   KNEE ARTHROSCOPY WITH LATERAL MENISECTOMY Left 12/30/2012   Procedure: KNEE ARTHROSCOPY WITH LATERAL MENISECTOMY;  Surgeon: JJohnny Bridge MD;  [facility]  Service: Orthopedics;  Laterality: Left;  LEFT KNEE SCOPE LATERAL MENISCECTOMY   LEFT HEART CATH AND CORONARY ANGIOGRAPHY N/A 03/15/2017   Procedure: Left Heart Cath and Coronary Angiography;  Surgeon: MWellington Hampshire MD;  [facility];  Service: Cardiovascular;  Laterality: N/A;  MOUTH SURGERY     skin cancer eyelid  2012   TONSILLECTOMY     Social History   Tobacco Use   Smoking status: Former    Packs/day: 1.00    Years: 12.00    Pack years: 12.00    Types: Cigarettes    Quit date: 11/24/1963    Years since quitting: 57.4   Smokeless tobacco: Never  Vaping Use   Vaping Use: Never used  Substance Use Topics   Alcohol use: No    Alcohol/week: 0.0 standard drinks   Drug use: No   Family History  Problem Relation Age of Onset   Cancer Mother        Colon   Heart disease Father        CAD   Diabetes Sister    Hypothyroidism Sister    Diabetes Brother    Leukemia Brother    Esophageal cancer Brother    Cancer Paternal Aunt        Breast   Diabetes Sister    Alzheimer's disease Sister    Diabetes Brother    Breast cancer Daughter    Allergies  Allergen Reactions   Antihistamines, Chlorpheniramine-Type Other (See Comments)    Reaction:  Makes pt hyper    Atenolol Hypertension   Cefuroxime Axetil Other (See Comments)    Upset stomach   Ciprofloxacin Other (See Comments)    Upset stomach   Co Q 10 [Coenzyme Q10] Other (See Comments)    Reaction:  GI upset    Crestor [Rosuvastatin Calcium] Other (See Comments)    Reaction:  Myalgias    Dextromethorphan-Guaifenesin Other (See Comments)    Reaction:  Made pt jittery    Epinephrine Hives   Ramipril Other (See Comments)    Upset  stomach   Sulfamethoxazole-Trimethoprim Other (See Comments)    Upset stomach   Vitamin D Analogs Other (See Comments)    Reaction:  GI upset    Current Outpatient Medications on File Prior to Visit  Medication Sig Dispense Refill   acetaminophen (TYLENOL) 500 MG tablet Take 500 mg by mouth every 6 (six) hours as needed.     atorvastatin (LIPITOR) 10 MG tablet TAKE 1 TABLET BY MOUTH EVERY DAY AT 6 PM 90 tablet 0   Calcium Carbonate-Vitamin D (CALCIUM 600+D PO) Take 1 tablet by mouth daily.      clobetasol ointment (TEMOVATE) 0.62 % Apply 1 application topically 2 (two) times daily as needed.     clorazepate (TRANXENE) 7.5 MG tablet Take 1 tablet (7.5 mg total) by mouth daily as needed. 30 tablet 1   cyanocobalamin (,VITAMIN B-12,) 1000 MCG/ML injection Inject 1,000 mcg into the muscle every 30 (thirty) days.      irbesartan (AVAPRO) 300 MG tablet TAKE 1 TABLET BY MOUTH DAILY 90 tablet 3   omeprazole (PRILOSEC) 20 MG capsule Take 1 capsule (20 mg total) by mouth 2 (two) times daily as needed. 180 capsule 0   triamcinolone (NASACORT) 55 MCG/ACT AERO nasal inhaler Place 2 sprays into the nose daily. 1 Inhaler 12   No current facility-administered medications on file prior to visit.    Review of Systems  Constitutional:  Negative for activity change, appetite change, fatigue, fever and unexpected weight change.  HENT:  Negative for congestion, ear pain, rhinorrhea, sinus pressure and sore throat.   Eyes:  Negative for pain, redness and visual disturbance.  Respiratory:  Negative for cough, shortness of breath and wheezing.   Cardiovascular:  Negative for  chest pain, palpitations and leg swelling.  Gastrointestinal:  Negative for abdominal pain, blood in stool, constipation and diarrhea.  Endocrine: Negative for polydipsia and polyuria.  Genitourinary:  Negative for dysuria, frequency and urgency.  Musculoskeletal:  Positive for arthralgias. Negative for back pain and myalgias.       Knee  pain   Skin:  Negative for pallor and rash.  Allergic/Immunologic: Negative for environmental allergies.  Neurological:  Negative for dizziness, syncope and headaches.  Hematological:  Negative for adenopathy. Does not bruise/bleed easily.  Psychiatric/Behavioral:  Negative for decreased concentration and dysphoric mood. The patient is not nervous/anxious.       Objective:   Physical Exam Constitutional:      General: She is not in acute distress.    Appearance: Normal appearance. She is well-developed. She is obese. She is not ill-appearing or diaphoretic.  HENT:     Head: Normocephalic and atraumatic.  Eyes:     Conjunctiva/sclera: Conjunctivae normal.     Pupils: Pupils are equal, round, and reactive to light.  Neck:     Thyroid: No thyromegaly.     Vascular: No carotid bruit or JVD.  Cardiovascular:     Rate and Rhythm: Normal rate and regular rhythm.     Pulses: Normal pulses.     Heart sounds: Normal heart sounds.    No gallop.  Pulmonary:     Effort: Pulmonary effort is normal. No respiratory distress.     Breath sounds: Normal breath sounds. No wheezing or rales.  Abdominal:     General: Bowel sounds are normal. There is no distension or abdominal bruit.     Palpations: Abdomen is soft. There is no mass.     Tenderness: There is no abdominal tenderness.  Musculoskeletal:     Cervical back: Normal range of motion and neck supple.     Right lower leg: No edema.     Left lower leg: No edema.  Lymphadenopathy:     Cervical: No cervical adenopathy.  Skin:    General: Skin is warm and dry.     Coloration: Skin is not pale.     Findings: No rash.  Neurological:     Mental Status: She is alert.     Coordination: Coordination normal.     Deep Tendon Reflexes: Reflexes are normal and symmetric. Reflexes normal.  Psychiatric:        Mood and Affect: Mood normal.        Cognition and Memory: Cognition and memory normal.     Comments: pleasant          Assessment &  Plan:   Problem List Items Addressed This Visit       Cardiovascular and Mediastinum   Essential hypertension    bp in fair control at this time  BP Readings from Last 1 Encounters:  05/07/21 124/66  No changes needed Most recent labs reviewed  Disc lifstyle change with low sodium diet and exercise  Not taking diltiazem cd 120 mg daily and irbesartan 300 mg daily         Relevant Medications   apixaban (ELIQUIS) 5 MG TABS tablet   diltiazem (CARDIZEM CD) 120 MG 24 hr capsule   Episodic atrial fibrillation ([place]) - Primary    Doing well s/p hospitalization Reviewed hospital records, lab results and studies in detail  She converted to nsr during admission and it has not returned  Reassuring echocardiogram (grade 1 DD) Tolerating diltiazem 120 mg and eliquis 5 mg bid  so far  Reassuring exam today  Cardiology f/u planned mid July  She will put off her knee surgery  She is considering an apple watch to help monitor Disc s/s of a fib to watch for        Relevant Medications   apixaban (ELIQUIS) 5 MG TABS tablet   diltiazem (CARDIZEM CD) 120 MG 24 hr capsule     Other   Hyperlipidemia    Disc goals for lipids and reasons to control them Rev last labs with pt Rev low sat fat diet in detail  LDL of 107 with atorvastatin 20 mg        Relevant Medications   apixaban (ELIQUIS) 5 MG TABS tablet   diltiazem (CARDIZEM CD) 120 MG 24 hr capsule

## 2021-05-07 NOTE — Assessment & Plan Note (Signed)
Doing well s/p hospitalization Reviewed hospital records, lab results and studies in detail  She converted to nsr during admission and it has not returned  Reassuring echocardiogram (grade 1 DD) Tolerating diltiazem 120 mg and eliquis 5 mg bid so far  Reassuring exam today  Cardiology f/u planned mid July  She will put off her knee surgery  She is considering an apple watch to help monitor Disc s/s of a fib to watch for

## 2021-05-07 NOTE — Assessment & Plan Note (Signed)
Disc goals for lipids and reasons to control them Rev last labs with pt Rev low sat fat diet in detail  LDL of 107 with atorvastatin 20 mg

## 2021-05-08 ENCOUNTER — Ambulatory Visit: Payer: Medicare Other | Admitting: Medical

## 2021-05-11 NOTE — Progress Notes (Deleted)
Cardiology Office Note    Date:  05/11/2021   ID:  Tammy Manning, DOB October 24, 1935, MRN [id number]  PCP:  Abner Greenspan, MD  Cardiologist:  Kathlyn Sacramento, MD  Electrophysiologist:  None   Chief Complaint: Hospital follow up  History of Present Illness:   Tammy Manning is a 86 y.o. female with history of normal coronary arteries by LHC in 2018, aortic valve insufficiency, HTN with multiple intolerances to antihypertensives, HLD, diastolic dysfunction, mildly dilated aortic root measuring *** mm by echo in ***, B12 deficiency, and OA who presents for hospital follow up as below.   Echo in 02/2020, showed a normal LVSF with an EF of ***, trivial aortic valve insufficiency without stenosis, and a mildly dilated aortic root measuring 39 mm. She has multiple medication intolerances, and at her last outpatient visit in 01/2021, she noted leg heaviness on days she took chlorthalidone, leading this to be stopped, with a trial of amlodipine undertaken. Unfortunately, she did not tolerate amlodipine, and preferred to avoid further medication additions.   She was seen in the ED in 02/2021 with leg pain and cough. Workup was normal including lower extremity ultrasound was negative for DVT, high sensitivity troponin negative, BNP, Covid/influenza, EKG, and CXR.  She was recently admitted to the hospital from 6/7-6/8 with new onset Afib with RVR, converting to sinus rhythm with IV Cardizem gtt. High sensitivity troponin negative x 2. Echo showed an EF of 60-65%, mild LVH, no RWMA, Gr1DD, normal RV systolic function and ventricular cavity size, normal PASP, mildly dilated left atrium, no significant valvular abnormalities, and a normal sized aortic root. Given a CHADS2VASc of at least 4 (HTN, age x 2, sex category), she was placed on Eliquis to reduce CVA risk. It was recommended she take Cardizem for rate control and continue irbesartan for HTN.   ***   Labs independently reviewed: 04/2021 - HGB 11.9,  PLT 376,potassium 4.3, BUN 17, SCr 0.75,magnesium 1.7, albumin 3.6, AST/ALT normal 04/2020 - TC 201, TG 132, HDL 66, LDL 107, TSH normal  Past Medical History:  Diagnosis Date   Anginal pain ([place])    Aortic insufficiency    a. 02/2020 Echo: EF 60-65%, no rwma, Gr1 DD, nl RV fxn, mildly dil LA, triv AI, Asc Ao 80m.   Arthritis    Knees   B12 deficiency    Mild   Bucket-handle tear of lateral meniscus of left knee as current injury 12/30/2012   [place]cer ([place]    Skin, basal cell on nose and eyelid   DJD (degenerative joint disease)    Low back   Dyspepsia    GERD (gastroesophageal reflux disease)    Heart murmur    Heart palpitations    History of cardiac cath    a. 02/2017 Cath: Nl cors. Nl LV fxn. Abd Aogram w/o RAS.   HOH (hard of hearing)    Hyperlipidemia    Hypertension    Lactose intolerance    Mixed incontinence    PONV (postoperative nausea and vomiting)    Vulvar irritation    from urine pad, uses Triamcinolone oint.    Past Surgical History:  Procedure Laterality Date   ABDOMINAL AORTOGRAM N/A 03/15/2017   Procedure: Abdominal Aortogram;  Surgeon: MWellington Hampshire MD;  [facility];  Service: Cardiovascular;  Laterality: N/A;   ABDOMINAL HYSTERECTOMY  1982   Large fibroids and bleeding   APPENDECTOMY     BILATERAL SALPINGOOPHORECTOMY  1986   BONE GRAFT HIP  ILIAC CREST     To Left arm   BREAST REDUCTION SURGERY     BREAST SURGERY     CARDIAC CATHETERIZATION     CATARACT EXTRACTION W/PHACO Left 07/07/2016   Procedure: CATARACT EXTRACTION PHACO AND INTRAOCULAR LENS PLACEMENT ([place]);  Surgeon: Birder Robson, MD;  Location: [facility];  Service: Ophthalmology;  Laterality: Left;  [place] 01:10 AP% 18.3 CDE 12.91 Fluid pak lot # [id number]   CATARACT EXTRACTION W/PHACO Right 07/28/2016   Procedure: CATARACT EXTRACTION PHACO AND INTRAOCULAR LENS PLACEMENT (IOC);  Surgeon: Birder Robson, MD;  Location: [facility];  Service: Ophthalmology;  Laterality: Right;   [place] 01:26 AP% 18.5 CDE 16.12 Fluid pack lot # [id number]   COLONOSCOPY  07/2016   Dr. Thana Farr   DILATION AND CURETTAGE OF UTERUS     miscarragesx2   ESOPHAGOGASTRODUODENOSCOPY     Polyps   HEMORRHOID SURGERY  2006   KNEE ARTHROSCOPY WITH LATERAL MENISECTOMY Left 12/30/2012   Procedure: KNEE ARTHROSCOPY WITH LATERAL MENISECTOMY;  Surgeon: Johnny Bridge, MD;  Location: [facility];  Service: Orthopedics;  Laterality: Left;  LEFT KNEE SCOPE LATERAL MENISCECTOMY   LEFT HEART CATH AND CORONARY ANGIOGRAPHY N/A 03/15/2017   Procedure: Left Heart Cath and Coronary Angiography;  Surgeon: Wellington Hampshire, MD;  Location: [facility];  Service: Cardiovascular;  Laterality: N/A;   MOUTH SURGERY     skin cancer eyelid  2012   TONSILLECTOMY      Current Medications: No outpatient medications have been marked as taking for the 05/13/21 encounter (Appointment) with Rise Mu, PA-C.    Allergies:   Antihistamines, chlorpheniramine-type; Atenolol; Cefuroxime axetil; Ciprofloxacin; Co q 10 [coenzyme q10]; Crestor [rosuvastatin calcium]; Dextromethorphan-guaifenesin; Epinephrine; Ramipril; Sulfamethoxazole-trimethoprim; and Vitamin d analogs   Social History   Socioeconomic History   Marital status: Married    Spouse name: Not on file   Number of children: 2   Years of education: Not on file   Highest education level: Not on file  Occupational History   Occupation: Architect: RETIRED  Tobacco Use   Smoking status: Former    Packs/day: 1.00    Years: 12.00    Pack years: 12.00    Types: Cigarettes    Quit date: 11/24/1963    Years since quitting: 57.5   Smokeless tobacco: Never  Vaping Use   Vaping Use: Never used  Substance and Sexual Activity   Alcohol use: No    Alcohol/week: 0.0 standard drinks   Drug use: No   Sexual activity: Yes  Other Topics Concern   Not on file  Social History Narrative   Metallurgist.     Social Determinants of  Health   Financial Resource Strain: Not on file  Food Insecurity: Not on file  Transportation Needs: Not on file  Physical Activity: Not on file  Stress: Not on file  Social Connections: Not on file     Family History:  The patient's family history includes Alzheimer's disease in her sister; Breast cancer in her daughter; Cancer in her mother and paternal aunt; Diabetes in her brother, brother, sister, and sister; Esophageal cancer in her brother; Heart disease in her father; Hypothyroidism in her sister; Leukemia in her brother.  ROS:   ROS   EKGs/Labs/Other Studies Reviewed:    Studies reviewed were summarized above. The additional studies were reviewed today:  EKG:  EKG is ordered today.  The EKG ordered today demonstrates ***  Recent Labs: 03/20/2021:  B Natriuretic Peptide 72.4 04/28/2021: ALT 12; Magnesium 1.7 04/29/2021: BUN 17; Creatinine, Ser 0.75; Hemoglobin 11.9; Platelets 376; Potassium 4.3; Sodium 140  Recent Lipid Panel    Component Value Date/Time   CHOL 201 (H) 04/25/2020 0901   CHOL 196 02/02/2020 1348   TRIG 132.0 04/25/2020 0901   HDL 66.80 04/25/2020 0901   HDL 80 02/02/2020 1348   CHOLHDL 3 04/25/2020 0901   VLDL 26.4 04/25/2020 0901   LDLCALC 107 (H) 04/25/2020 0901   LDLCALC 93 02/02/2020 1348   LDLDIRECT 106 (H) 02/02/2020 1348   LDLDIRECT 105.9 02/08/2013 1050    PHYSICAL EXAM:    VS:  There were no vitals taken for this visit.  BMI: There is no height or weight on file to calculate BMI.  Physical Exam  Wt Readings from Last 3 Encounters:  05/07/21 185 lb 8 oz (84.1 kg)  04/29/21 286 lb 9.6 oz (130 kg)  04/22/21 187 lb 9.6 oz (85.1 kg)     ASSESSMENT & PLAN:   PAF: ***.  CHADS2VASc at least 4 (HTN, age x 2, sex category).   HTN: Blood pressure ***  HLD: LDL 107 in 04/2020.  ***  Dilated aortic root: Aortic root normal in size and structure on echo earlier this month.   Aortic valve insufficiency: No evidence of aortic valve  insufficiency by echo earlier this month.   Disposition: F/u with Dr. Fletcher Anon or an APP in ***.   Medication Adjustments/Labs and Tests Ordered: Current medicines are reviewed at length with the patient today.  Concerns regarding medicines are outlined above. Medication changes, Labs and Tests ordered today are summarized above and listed in the Patient Instructions accessible in Encounters.   Signed, [name], PA-C 05/11/2021 9:04 AM     [address] [place], [place] [address] [phone]

## 2021-05-13 ENCOUNTER — Ambulatory Visit: Payer: Medicare Other | Admitting: Physician Assistant

## 2021-05-27 ENCOUNTER — Other Ambulatory Visit: Payer: Self-pay | Admitting: Family Medicine

## 2021-05-27 MED ORDER — OMEPRAZOLE 20 MG PO CPDR: 20.0000 mg | DELAYED_RELEASE_CAPSULE | Freq: Two times a day (BID) | ORAL | 1 refills | Status: DC | PRN

## 2021-05-28 ENCOUNTER — Encounter: Payer: Self-pay | Admitting: Podiatry

## 2021-05-28 ENCOUNTER — Ambulatory Visit (INDEPENDENT_AMBULATORY_CARE_PROVIDER_SITE_OTHER): Payer: Medicare Other | Admitting: Podiatry

## 2021-05-28 ENCOUNTER — Other Ambulatory Visit: Payer: Self-pay | Admitting: Podiatry

## 2021-05-28 ENCOUNTER — Ambulatory Visit (INDEPENDENT_AMBULATORY_CARE_PROVIDER_SITE_OTHER): Payer: Medicare Other

## 2021-05-28 ENCOUNTER — Other Ambulatory Visit: Payer: Self-pay

## 2021-05-28 DIAGNOSIS — M76821 Posterior tibial tendinitis, right leg: Secondary | ICD-10-CM

## 2021-05-28 DIAGNOSIS — M25571 Pain in right ankle and joints of right foot: Secondary | ICD-10-CM

## 2021-05-28 NOTE — Patient Instructions (Signed)

## 2021-05-29 ENCOUNTER — Ambulatory Visit: Payer: Medicare Other | Admitting: Nurse Practitioner

## 2021-05-31 NOTE — Progress Notes (Signed)
  Subjective:  Patient ID: Tammy Manning, female    DOB: 1935/04/23,  MRN: [id number]  Chief Complaint  Patient presents with   Ankle Pain    Patient presents today for right medial side ankle pain and swelling x 1 week    86 y.o. female presents with the above complaint. History confirmed with patient.  Does not remember any sort of injury and has been swelling up.  It hurts when she for stands up in the morning and begins moving.  Objective:  Physical Exam: warm, good capillary refill, no trophic changes or ulcerative lesions, normal DP and PT pulses, and normal sensory exam.  Right Foot: soft tissue swelling noted over the posterior tibial tendon course and insertion distally with pain on palpation and with resisted eversion    Radiographs: Multiple views x-ray of the right foot: no fracture, dislocation, swelling or degenerative changes noted Assessment:   1. Posterior tibial tendinitis, right      Plan:  Patient was evaluated and treated and all questions answered.  Discussed the etiology and treatment options for posterior tibial tendinitis including stretching, formal physical therapy with an eccentric exercises therapy plan, supportive shoegears such as a running shoe or sneaker, orthotic support, topical and oral medications.  We also discussed that I do not routinely perform injections in this area because of the risk of an increased damage or rupture of the tendon.  We also discussed the role of surgical treatment of this for patients who do not improve after exhausting non-surgical treatment options.  -XR reviewed with patient -Educated on stretching and icing of the affected limb. -RICE protocol reviewed, recommend OTC Tylenol she cannot take NSAIDs due to her Eliquis use -Tri-Lock ankle brace dispensed   Return in about 1 month (around 06/28/2021) for re-check PT tendinitis.

## 2021-06-03 ENCOUNTER — Ambulatory Visit (INDEPENDENT_AMBULATORY_CARE_PROVIDER_SITE_OTHER): Payer: Medicare Other | Admitting: *Deleted

## 2021-06-03 ENCOUNTER — Other Ambulatory Visit: Payer: Self-pay

## 2021-06-03 DIAGNOSIS — E538 Deficiency of other specified B group vitamins: Secondary | ICD-10-CM | POA: Diagnosis not present

## 2021-06-03 MED ORDER — CYANOCOBALAMIN 1000 MCG/ML IJ SOLN
1000.0000 ug | Freq: Once | INTRAMUSCULAR | Status: AC
  Administered 2021-06-03: 1000 ug via INTRAMUSCULAR

## 2021-06-03 NOTE — Progress Notes (Signed)
Per orders of Allie Bossier, NP, injection of b12 given by Earleen Reaper M in the left deltoid. PCP out of the office today. Patient tolerated injection well.

## 2021-06-05 ENCOUNTER — Encounter: Payer: Self-pay | Admitting: Cardiovascular Disease

## 2021-06-05 ENCOUNTER — Ambulatory Visit (INDEPENDENT_AMBULATORY_CARE_PROVIDER_SITE_OTHER): Payer: Medicare Other | Admitting: Cardiovascular Disease

## 2021-06-05 ENCOUNTER — Other Ambulatory Visit: Payer: Self-pay

## 2021-06-05 VITALS — BP 140/70 | HR 77 | Ht 64.5 in | Wt 194.5 lb

## 2021-06-05 DIAGNOSIS — I10 Essential (primary) hypertension: Secondary | ICD-10-CM

## 2021-06-05 DIAGNOSIS — I480 Paroxysmal atrial fibrillation: Secondary | ICD-10-CM | POA: Diagnosis not present

## 2021-06-05 DIAGNOSIS — E785 Hyperlipidemia, unspecified: Secondary | ICD-10-CM | POA: Diagnosis not present

## 2021-06-05 NOTE — Patient Instructions (Signed)
Medication Instructions:  Your physician recommends that you continue on your current medications as directed. Please refer to the Current Medication list given to you today.  *If you need a refill on your cardiac medications before your next appointment, please call your pharmacy*   Lab Work: None ordered If you have labs (blood work) drawn today and your tests are completely normal, you will receive your results only by: [place] (if you have MyChart) OR A paper copy in the mail If you have any lab test that is abnormal or we need to change your treatment, we will call you to review the results.   Testing/Procedures: None ordered   Follow-Up: At [facility], you and your health needs are our priority.  As part of our continuing mission to provide you with exceptional heart care, we have created designated Provider Care Teams.  These Care Teams include your primary Cardiologist (physician) and Advanced Practice Providers (APPs -  Physician Assistants and Nurse Practitioners) who all work together to provide you with the care you need, when you need it.  We recommend signing up for the patient portal called "MyChart".  Sign up information is provided on this After Visit Summary.  MyChart is used to connect with patients for Virtual Visits (Telemedicine).  Patients are able to view lab/test results, encounter notes, upcoming appointments, etc.  Non-urgent messages can be sent to your provider as well.   To learn more about what you can do with MyChart, go to [email or web address].    Your next appointment:   Your physician wants you to follow-up in: 4 months You will receive a reminder letter in the mail two months in advance. If you don't receive a letter, please call our office to schedule the follow-up appointment.   The format for your next appointment:   In Person  Provider:   You may see Kathlyn Sacramento, MD or one of the following Advanced Practice Providers on your  designated Care Team:   Murray Hodgkins, NP [name], PA-C Marrianne Mood, PA-C Cadence Kathlen Mody, [place]   Other Instructions N/A

## 2021-06-05 NOTE — Progress Notes (Signed)
Cardiology Office Note   Date:  06/05/2021   ID:  Tammy Manning, DOB 1935/04/13, MRN [id number]  PCP:  Abner Greenspan, MD  Cardiologist:  Kathlyn Sacramento, MD   Chief Complaint  Patient presents with   Other    AFIB. Meds reviewed verbally with pt.       History of Present Illness: Tammy Manning is a 86 y.o. female who presents for a follow-up visit regarding atrial fibrillation and aortic insufficiency.   She has intolerance to multiple antihypertensive medications.  Cardiac catheterization in April 2018 showed normal coronary arteries and normal LV systolic function.  Abdominal aortogram showed no evidence of renal artery stenosis.    Previous echocardiogram in June 2019 showed an EF of 55 - 60%, mild LVH and mild aortic insufficiency.    She was hospitalized in June for atrial fibrillation.  She took an extra dose of chlorthalidone for leg edema and presented with increased fatigue and palpitations.  She was noted to be in atrial fibrillation with RVR.  She was started on diltiazem drip and converted to sinus rhythm.  She was noted to be mildly hypokalemic. The patient was switched from chlorthalidone to diltiazem.  CHA2DS2-VASc score was 4 and thus she was started on anticoagulation with Eliquis 5 mg twice daily.  Echocardiogram was done which showed an EF of 60 to 65%, mildly dilated left atrium with mildly calcified aortic valve.  She has been doing well with no recurrent chest pain, shortness of breath or palpitations.  No side effects with anticoagulation.  She does report some mild vaginal bleeding for which I asked her to follow-up with her primary care physician.  Past Medical History:  Diagnosis Date   Anginal pain ([place])    Aortic insufficiency    a. 02/2020 Echo: EF 60-65%, no rwma, Gr1 DD, nl RV fxn, mildly dil LA, triv AI, Asc Ao 26m.   Arthritis    Knees   B12 deficiency    Mild   Bucket-handle tear of lateral meniscus of left knee as current injury  12/30/2012   Cancer ([place]    Skin, basal cell on nose and eyelid   DJD (degenerative joint disease)    Low back   Dyspepsia    GERD (gastroesophageal reflux disease)    Heart murmur    Heart palpitations    History of cardiac cath    a. 02/2017 Cath: Nl cors. Nl LV fxn. Abd Aogram w/o RAS.   HOH (hard of hearing)    Hyperlipidemia    Hypertension    Lactose intolerance    Mixed incontinence    PONV (postoperative nausea and vomiting)    Vulvar irritation    from urine pad, uses Triamcinolone oint.    Past Surgical History:  Procedure Laterality Date   ABDOMINAL AORTOGRAM N/A 03/15/2017   Procedure: Abdominal Aortogram;  Surgeon: MWellington Hampshire MD;  [facility];  Service: Cardiovascular;  Laterality: N/A;   ABDOMINAL HYSTERECTOMY  1982   Large fibroids and bleeding   APPENDECTOMY     BILATERAL SALPINGOOPHORECTOMY  1986   BONE GRAFT HIP ILIAC CREST     To Left arm   BREAST REDUCTION SURGERY     BREAST SURGERY     CARDIAC CATHETERIZATION     CATARACT EXTRACTION W/PHACO Left 07/07/2016   Procedure: CATARACT EXTRACTION PHACO AND INTRAOCULAR LENS PLACEMENT (IOC);  Surgeon: WBirder Robson MD;  Location: [facility];  Service: Ophthalmology;  Laterality: Left;  [place]:10  AP% 18.3 CDE 12.91 Fluid pak lot # [id number]   CATARACT EXTRACTION W/PHACO Right 07/28/2016   Procedure: CATARACT EXTRACTION PHACO AND INTRAOCULAR LENS PLACEMENT (IOC);  Surgeon: Birder Robson, MD;  Location: [facility];  Service: Ophthalmology;  Laterality: Right;  [place] 01:26 AP% 18.5 CDE 16.12 Fluid pack lot # [id number]   COLONOSCOPY  07/2016   Dr. Thana Farr   DILATION AND CURETTAGE OF UTERUS     miscarragesx2   ESOPHAGOGASTRODUODENOSCOPY     Polyps   HEMORRHOID SURGERY  2006   KNEE ARTHROSCOPY WITH LATERAL MENISECTOMY Left 12/30/2012   Procedure: KNEE ARTHROSCOPY WITH LATERAL MENISECTOMY;  Surgeon: Johnny Bridge, MD;  Location: [facility];  Service: Orthopedics;  Laterality: Left;   LEFT KNEE SCOPE LATERAL MENISCECTOMY   LEFT HEART CATH AND CORONARY ANGIOGRAPHY N/A 03/15/2017   Procedure: Left Heart Cath and Coronary Angiography;  Surgeon: Wellington Hampshire, MD;  Location: [facility];  Service: Cardiovascular;  Laterality: N/A;   MOUTH SURGERY     skin cancer eyelid  2012   TONSILLECTOMY       Current Outpatient Medications  Medication Sig Dispense Refill   acetaminophen (TYLENOL) 500 MG tablet Take 500 mg by mouth every 6 (six) hours as needed.     apixaban (ELIQUIS) 5 MG TABS tablet Take 1 tablet (5 mg total) by mouth 2 (two) times daily. 180 tablet 0   atorvastatin (LIPITOR) 10 MG tablet TAKE 1 TABLET BY MOUTH EVERY DAY AT 6 PM 90 tablet 0   Calcium Carbonate-Vitamin D (CALCIUM 600+D PO) Take 1 tablet by mouth daily.      clobetasol ointment (TEMOVATE) 6.28 % Apply 1 application topically 2 (two) times daily as needed.     clorazepate (TRANXENE) 7.5 MG tablet Take 1 tablet (7.5 mg total) by mouth daily as needed. 30 tablet 1   cyanocobalamin (,VITAMIN B-12,) 1000 MCG/ML injection Inject 1,000 mcg into the muscle every 30 (thirty) days.      diltiazem (CARDIZEM CD) 120 MG 24 hr capsule Take 1 capsule (120 mg total) by mouth daily. 90 capsule 0   irbesartan (AVAPRO) 300 MG tablet TAKE 1 TABLET BY MOUTH DAILY 90 tablet 3   omeprazole (PRILOSEC) 20 MG capsule Take 1 capsule (20 mg total) by mouth 2 (two) times daily as needed. 180 capsule 1   triamcinolone (NASACORT) 55 MCG/ACT AERO nasal inhaler Place 2 sprays into the nose daily. 1 Inhaler 12   No current facility-administered medications for this visit.    Allergies:   Antihistamines, chlorpheniramine-type; Atenolol; Cefuroxime axetil; Ciprofloxacin; Co q 10 [coenzyme q10]; Crestor [rosuvastatin calcium]; Dextromethorphan-guaifenesin; Epinephrine; Ramipril; Sulfamethoxazole-trimethoprim; and Vitamin d analogs    Social History:  The patient  reports that she quit smoking about 57 years ago. Her smoking  use included cigarettes. She has a 12.00 pack-year smoking history. She has never used smokeless tobacco. She reports that she does not drink alcohol and does not use drugs.   Family History:  The patient's family history includes Alzheimer's disease in her sister; Breast cancer in her daughter; Cancer in her mother and paternal aunt; Diabetes in her brother, brother, sister, and sister; Esophageal cancer in her brother; Heart disease in her father; Hypothyroidism in her sister; Leukemia in her brother.    ROS:  Please see the history of present illness.   Otherwise, review of systems are positive for none.   All other systems are reviewed and negative.    PHYSICAL EXAM: VS:  BP 140/70 (  BP Location: Left Arm, Patient Position: Sitting, Cuff Size: Normal)   Pulse 77   Ht 5' 4.5" (1.638 m)   Wt 194 lb 8 oz (88.2 kg)   SpO2 98%   BMI 32.87 kg/m  , BMI Body mass index is 32.87 kg/m. GEN: Well nourished, well developed, in no acute distress  HEENT: normal  Neck: no JVD, carotid bruits, or masses Cardiac: RRR; no  rubs, or gallops,no edema, 1 out of 6 systolic murmur in the aortic area. Respiratory:  clear to auscultation bilaterally, normal work of breathing GI: soft, nontender, nondistended, + BS MS: no deformity or atrophy  Skin: warm and dry, no rash Neuro:  Strength and sensation are intact Psych: euthymic mood, full affect   EKG:  EKG is ordered today. The ekg ordered today demonstrates normal sinus rhythm with no significant ST or T wave changes.  Minimal LVH.   Recent Labs: 03/20/2021: B Natriuretic Peptide 72.4 04/28/2021: ALT 12; Magnesium 1.7 04/29/2021: BUN 17; Creatinine, Ser 0.75; Hemoglobin 11.9; Platelets 376; Potassium 4.3; Sodium 140    Lipid Panel    Component Value Date/Time   CHOL 201 (H) 04/25/2020 0901   CHOL 196 02/02/2020 1348   TRIG 132.0 04/25/2020 0901   HDL 66.80 04/25/2020 0901   HDL 80 02/02/2020 1348   CHOLHDL 3 04/25/2020 0901   VLDL 26.4  04/25/2020 0901   LDLCALC 107 (H) 04/25/2020 0901   LDLCALC 93 02/02/2020 1348   LDLDIRECT 106 (H) 02/02/2020 1348   LDLDIRECT 105.9 02/08/2013 1050      Wt Readings from Last 3 Encounters:  06/05/21 194 lb 8 oz (88.2 kg)  05/07/21 185 lb 8 oz (84.1 kg)  04/29/21 286 lb 9.6 oz (130 kg)       No flowsheet data found.    ASSESSMENT AND PLAN:   1.  Paroxysmal atrial fibrillation: No recurrent atrial fibrillation since hospital discharge.  Continue diltiazem at the current dose.  She is tolerating anticoagulation with Eliquis 5 mg twice daily.  2. Essential hypertension:   Blood pressure is reasonably controlled.  Chlorthalidone was recently discontinued during hospitalization due to hypokalemia.  3. Hyperlipidemia: Currently she is on atorvastatin 10 mg daily.    Disposition:   FU with me in 4 months  Signed, Kathlyn Sacramento, MD 06/05/21 [place], [place]

## 2021-06-06 ENCOUNTER — Telehealth: Payer: Self-pay

## 2021-06-06 NOTE — Telephone Encounter (Signed)
Patient called stating she is on Eliquis and noticed she has been spotting vaginally x 1 week, she sees very pink color on the toilet paper after wiping but it happens when she has a bowel movement but the blood is not coming from anal area but vaginal. It is not in toilet water. Patient saw Dr Fletcher Anon yesterday and was advised to follow up with Dr Glori Bickers. Patient is not experiencing any abdominal pain, no pelvic pain or burning sensation.  Sending for triage. No appointments.

## 2021-06-06 NOTE — Telephone Encounter (Signed)
I spoke with Tammy Manning;Tammy Manning has had pink liquid like vaginal spotting for one wk; once saw spot on panty but usually sees after has BM but not from rectal area. Tammy Manning sees small amt of pink discoloration on toilet tissue; does not see everyday;No pink or red discoloration in commode water.no abd pain, no back pain, no pelvic pain, no N&V,no burning upon urination and no UTI symptoms; Tammy Manning does not have any unusual weakness; Tammy Manning said weakness for awhile but attributes to getting older. No fever, no injury, no lifting or moving things, no intercourse in 4 - 5 years. Tammy Manning had total hysterectomy including ovaries 45 years ago. Tammy Manning has had some swelling in ankles last 2 days but last night with feet up over night swelling went down. Tammy Manning has been trying to watch salt intake but did eat a hot dog. Tammy Manning said she does not feel bad.no difficulty breathing,CP or SOB. No covid symptoms and no known exposure to covid. I spoke with Dr Diona Browner and she said could give first available appt with any provider and Tammy Manning can continue Eliquis as prescribed and precautions for UC & ED; if any UTI symptoms, heavier bleeding abd pain, stop Eliquis and go to ED. Tammy Manning voiced understanding,agreed with plan and scheduled in office appt with Dr Darnell Level on 06/09/21 at 8:30. Sending note to Dr Glori Bickers PCP who is out of office, Dr Diona Browner and Dr Danise Mina.

## 2021-06-08 ENCOUNTER — Emergency Department
Admission: EM | Admit: 2021-06-08 | Discharge: 2021-06-08 | Disposition: A | Discharge status: Home / Self Care | Payer: Medicare Other | Attending: Emergency Medicine | Admitting: Emergency Medicine

## 2021-06-08 ENCOUNTER — Other Ambulatory Visit: Payer: Self-pay

## 2021-06-08 ENCOUNTER — Encounter: Payer: Self-pay | Admitting: Emergency Medicine

## 2021-06-08 ENCOUNTER — Emergency Department: Payer: Medicare Other

## 2021-06-08 DIAGNOSIS — Z79899 Other long term (current) drug therapy: Secondary | ICD-10-CM | POA: Insufficient documentation

## 2021-06-08 DIAGNOSIS — Z7901 Long term (current) use of anticoagulants: Secondary | ICD-10-CM | POA: Diagnosis not present

## 2021-06-08 DIAGNOSIS — I10 Essential (primary) hypertension: Secondary | ICD-10-CM | POA: Insufficient documentation

## 2021-06-08 DIAGNOSIS — R002 Palpitations: Secondary | ICD-10-CM | POA: Diagnosis not present

## 2021-06-08 DIAGNOSIS — R0902 Hypoxemia: Secondary | ICD-10-CM | POA: Diagnosis not present

## 2021-06-08 DIAGNOSIS — Z85828 Personal history of other malignant neoplasm of skin: Secondary | ICD-10-CM | POA: Insufficient documentation

## 2021-06-08 DIAGNOSIS — I4891 Unspecified atrial fibrillation: Secondary | ICD-10-CM | POA: Insufficient documentation

## 2021-06-08 DIAGNOSIS — Z87891 Personal history of nicotine dependence: Secondary | ICD-10-CM | POA: Insufficient documentation

## 2021-06-08 DIAGNOSIS — R000 Tachycardia, unspecified: Secondary | ICD-10-CM | POA: Diagnosis not present

## 2021-06-08 LAB — CBC WITH DIFFERENTIAL/PLATELET
Abs Immature Granulocytes: 0.03 10*3/uL (ref 0.00–0.07)
Basophils Absolute: 0.1 10*3/uL (ref 0.0–0.1)
Basophils Relative: 1 %
Eosinophils Absolute: 0.2 10*3/uL (ref 0.0–0.5)
Eosinophils Relative: 3 %
HCT: 37 % (ref 36.0–46.0)
Hemoglobin: 12.2 g/dL (ref 12.0–15.0)
Immature Granulocytes: 0 %
Lymphocytes Relative: 33 %
Lymphs Abs: 2.9 10*3/uL (ref 0.7–4.0)
MCH: 31.2 pg (ref 26.0–34.0)
MCHC: 33 g/dL (ref 30.0–36.0)
MCV: 94.6 fL (ref 80.0–100.0)
Monocytes Absolute: 1.2 10*3/uL — ABNORMAL HIGH (ref 0.1–1.0)
Monocytes Relative: 13 %
Neutro Abs: 4.5 10*3/uL (ref 1.7–7.7)
Neutrophils Relative %: 50 %
Platelets: 354 10*3/uL (ref 150–400)
RBC: 3.91 MIL/uL (ref 3.87–5.11)
RDW: 14.5 % (ref 11.5–15.5)
WBC: 8.9 10*3/uL (ref 4.0–10.5)
nRBC: 0 % (ref 0.0–0.2)

## 2021-06-08 LAB — URINALYSIS, COMPLETE (UACMP) WITH MICROSCOPIC
Bacteria, UA: NONE SEEN
Bilirubin Urine: NEGATIVE
Glucose, UA: NEGATIVE mg/dL
Ketones, ur: NEGATIVE mg/dL
Nitrite: NEGATIVE
Protein, ur: NEGATIVE mg/dL
Specific Gravity, Urine: 1.003 — ABNORMAL LOW (ref 1.005–1.030)
Squamous Epithelial / HPF: NONE SEEN (ref 0–5)
pH: 6 (ref 5.0–8.0)

## 2021-06-08 LAB — COMPREHENSIVE METABOLIC PANEL
ALT: 16 U/L (ref 0–44)
AST: 22 U/L (ref 15–41)
Albumin: 3.9 g/dL (ref 3.5–5.0)
Alkaline Phosphatase: 58 U/L (ref 38–126)
Anion gap: 9 (ref 5–15)
BUN: 14 mg/dL (ref 8–23)
CO2: 23 mmol/L (ref 22–32)
Calcium: 9.3 mg/dL (ref 8.9–10.3)
Chloride: 104 mmol/L (ref 98–111)
Creatinine, Ser: 0.81 mg/dL (ref 0.44–1.00)
GFR, Estimated: >60 mL/min (ref >60)
Glucose, Bld: 92 mg/dL (ref 70–99)
Potassium: 4 mmol/L (ref 3.5–5.1)
Sodium: 136 mmol/L (ref 135–145)
Total Bilirubin: 0.9 mg/dL (ref 0.3–1.2)
Total Protein: 6.9 g/dL (ref 6.5–8.1)

## 2021-06-08 LAB — TROPONIN I (HIGH SENSITIVITY)
Troponin I (High Sensitivity): 5 ng/L (ref <18)
Troponin I (High Sensitivity): 6 ng/L (ref <18)

## 2021-06-08 MED ORDER — DILTIAZEM HCL 25 MG/5ML IV SOLN
10.0000 mg | Freq: Once | INTRAVENOUS | Status: AC
  Administered 2021-06-08: 10 mg via INTRAVENOUS
  Filled 2021-06-08: qty 5

## 2021-06-08 MED ORDER — DILTIAZEM HCL 25 MG/5ML IV SOLN
10.0000 mg | Freq: Once | INTRAVENOUS | Status: AC
  Administered 2021-06-08: 10 mg via INTRAVENOUS

## 2021-06-08 MED ORDER — DILTIAZEM HCL ER COATED BEADS 120 MG PO CP24
120.0000 mg | ORAL_CAPSULE | Freq: Once | ORAL | Status: AC
  Administered 2021-06-08: 120 mg via ORAL
  Filled 2021-06-08: qty 1

## 2021-06-08 MED ORDER — DILTIAZEM HCL ER COATED BEADS 240 MG PO CP24: 240.0000 mg | ORAL_CAPSULE | Freq: Every day | ORAL | 1 refills | Status: DC

## 2021-06-08 NOTE — ED Provider Notes (Signed)
[facility] Emergency Department Provider Note  ____________________________________________  Time seen: Approximately 1:29 PM  I have reviewed the triage vital signs and the nursing notes.   HISTORY  Chief Complaint Palpitations    HPI Tammy Manning is a 86 y.o. female who presents to the emergency department complaining of palpitations and "feeling off in my chest."  Patient states that last night she had some symptoms that she thought was GERD with a little bit of a burning sensation of the middle of her chest into her throat.  Patient states that the symptoms settle down, she did not have any other symptoms last night and then woke up this morning with some palpitations and a "off feeling" in her chest.  She denies any true substernal pain.  No radiation of the symptoms to her arms or neck.  Patient denies any recent illnesses to include fevers or chills, nasal congestion, sore throat, cough.  Patient has had no abdominal pain, nausea, vomiting, diarrhea or constipation.  No dysuria, hematuria.  She states that she did urinate more than normal yesterday.  Patient does have a history of paroxysmal A. fib.  She was admitted in June for A. fib and found to be hypokalemic.  Patient had seen her cardiologist 3 days ago and was not in A. fib at that time.  On review of medical records patient has an ejection fraction of 60 to 65% with some mild aortic insufficiency.  History of angina, degenerative disc disease, hyperlipidemia, hypertension, paroxysmal A. fib.       Past Medical History:  Diagnosis Date   Anginal pain ([place])    Aortic insufficiency    a. 02/2020 Echo: EF 60-65%, no rwma, Gr1 DD, nl RV fxn, mildly dil LA, triv AI, Asc Ao 80m.   Arthritis    Knees   B12 deficiency    Mild   Bucket-handle tear of lateral meniscus of left knee as current injury 12/30/2012   Cancer ([place]    Skin, basal cell on nose and eyelid   DJD (degenerative joint disease)    Low  back   Dyspepsia    GERD (gastroesophageal reflux disease)    Heart murmur    Heart palpitations    History of cardiac cath    a. 02/2017 Cath: Nl cors. Nl LV fxn. Abd Aogram w/o RAS.   HOH (hard of hearing)    Hyperlipidemia    Hypertension    Lactose intolerance    Mixed incontinence    PONV (postoperative nausea and vomiting)    Vulvar irritation    from urine pad, uses Triamcinolone oint.    Patient Active Problem List   Diagnosis Date Noted   Atrial fibrillation with RVR ([place] 04/28/2021   Hypertrophic toenail 04/22/2021   Cough 03/24/2021   Pedal edema 03/24/2021   Leg pain, left 03/24/2021   Elevated rheumatoid factor 11/25/2020   Bilateral hand pain 11/25/2020   Pain in right shoulder 10/16/2020   Joint pain 10/16/2020   Joint swelling 10/16/2020   Estrogen deficiency 04/30/2020   Low back pain 03/15/2020   Dry mouth 03/15/2020   Osteoarthritis of right hip 01/02/2020   Groin pain, right 01/01/2020   Trochanteric bursitis of right hip 11/09/2018   Transient global amnesia 05/12/2018   Right thyroid nodule 05/12/2018   History of TIA (transient ischemic attack) 05/09/2018   Aortic insufficiency 09/10/2015   Stress reaction 09/08/2015   Encounter for Medicare annual wellness exam 02/28/2013   Bucket-handle tear of  lateral meniscus of left knee as current injury 12/30/2012   Episodic atrial fibrillation ([place]) 11/17/2012   Special screening for malignant neoplasms, colon 01/21/2012   Hearing loss of both ears 01/14/2012   Adverse drug reaction 03/12/2011   HEADACHE, CHRONIC 09/24/2010   B12 deficiency 03/17/2010   DYSPEPSIA 03/17/2010   POSTMENOPAUSAL STATUS 08/20/2008   Essential hypertension 07/19/2007   Hyperlipidemia 06/24/2007   CONSTIPATION 06/24/2007   IBS 06/24/2007   DERMATITIS, ATOPIC 06/24/2007   SKIN CANCER, HX OF 06/24/2007    Past Surgical History:  Procedure Laterality Date   ABDOMINAL AORTOGRAM N/A 03/15/2017   Procedure: Abdominal  Aortogram;  Surgeon: Wellington Hampshire, MD;  Location: [place] [facility];  Service: Cardiovascular;  Laterality: N/A;   ABDOMINAL HYSTERECTOMY  1982   Large fibroids and bleeding   APPENDECTOMY     BILATERAL SALPINGOOPHORECTOMY  1986   BONE GRAFT HIP ILIAC CREST     To Left arm   BREAST REDUCTION SURGERY     BREAST SURGERY     CARDIAC CATHETERIZATION     CATARACT EXTRACTION W/PHACO Left 07/07/2016   Procedure: CATARACT EXTRACTION PHACO AND INTRAOCULAR LENS PLACEMENT (IOC);  Surgeon: Birder Robson, MD;  Location: [facility];  Service: Ophthalmology;  Laterality: Left;  [place] 01:10 AP% 18.3 CDE 12.91 Fluid pak lot # [id number]   CATARACT EXTRACTION W/PHACO Right 07/28/2016   Procedure: CATARACT EXTRACTION PHACO AND INTRAOCULAR LENS PLACEMENT (IOC);  Surgeon: Birder Robson, MD;  Location: [facility];  Service: Ophthalmology;  Laterality: Right;  [place] 01:26 AP% 18.5 CDE 16.12 Fluid pack lot # [id number]   COLONOSCOPY  07/2016   Dr. Thana Farr   DILATION AND CURETTAGE OF UTERUS     miscarragesx2   ESOPHAGOGASTRODUODENOSCOPY     Polyps   HEMORRHOID SURGERY  2006   KNEE ARTHROSCOPY WITH LATERAL MENISECTOMY Left 12/30/2012   Procedure: KNEE ARTHROSCOPY WITH LATERAL MENISECTOMY;  Surgeon: Johnny Bridge, MD;  Location: [place];  Service: Orthopedics;  Laterality: Left;  LEFT KNEE SCOPE LATERAL MENISCECTOMY   LEFT HEART CATH AND CORONARY ANGIOGRAPHY N/A 03/15/2017   Procedure: Left Heart Cath and Coronary Angiography;  Surgeon: Wellington Hampshire, MD;  Location: [place] [facility];  Service: Cardiovascular;  Laterality: N/A;   MOUTH SURGERY     skin cancer eyelid  2012   TONSILLECTOMY      Prior to Admission medications   Medication Sig Start Date End Date Taking? Authorizing Provider  acetaminophen (TYLENOL) 500 MG tablet Take 500 mg by mouth every 6 (six) hours as needed.    [provider]  apixaban (ELIQUIS) 5 MG TABS tablet Take 1 tablet (5 mg total) by mouth 2 (two)  times daily. 05/07/21   [name], [name], MD  atorvastatin (LIPITOR) 10 MG tablet TAKE 1 TABLET BY MOUTH EVERY DAY AT 6 PM 04/29/21   [name], [name], MD  Calcium Carbonate-Vitamin D (CALCIUM 600+D PO) Take 1 tablet by mouth daily.     [provider]  clobetasol ointment (TEMOVATE) 4.19 % Apply 1 application topically 2 (two) times daily as needed.    [provider]  clorazepate (TRANXENE) 7.5 MG tablet Take 1 tablet (7.5 mg total) by mouth daily as needed. 02/18/21   [name], [name], MD  cyanocobalamin (,VITAMIN B-12,) 1000 MCG/ML injection Inject 1,000 mcg into the muscle every 30 (thirty) days.  12/16/16   [provider]  diltiazem (CARDIZEM CD) 240 MG 24 hr capsule Take 1 capsule (240 mg total)  by mouth daily. 06/08/21 07/08/21  Lavonia Drafts, MD  irbesartan (AVAPRO) 300 MG tablet TAKE 1 TABLET BY MOUTH DAILY 02/18/21   [name], [name], MD  omeprazole (PRILOSEC) 20 MG capsule Take 1 capsule (20 mg total) by mouth 2 (two) times daily as needed. 05/27/21   [name], [name], MD  triamcinolone (NASACORT) 55 MCG/ACT AERO nasal inhaler Place 2 sprays into the nose daily. 02/22/19   [name], [name], MD    Allergies Antihistamines, chlorpheniramine-type; Atenolol; Cefuroxime axetil; Ciprofloxacin; Co q 10 [coenzyme q10]; Crestor [rosuvastatin calcium]; Dextromethorphan-guaifenesin; Epinephrine; Ramipril; Sulfamethoxazole-trimethoprim; and Vitamin d analogs  Family History  Problem Relation Age of Onset   Cancer Mother        Colon   Heart disease Father        CAD   Diabetes Sister    Hypothyroidism Sister    Diabetes Brother    Leukemia Brother    Esophageal cancer Brother    Cancer Paternal Aunt        Breast   Diabetes Sister    Alzheimer's disease Sister    Diabetes Brother    Breast cancer Daughter     Social History Social History   Tobacco Use   Smoking status: Former    Packs/day: 1.00    Years: 12.00    Pack years: 12.00    Types: Cigarettes    Quit date:  11/24/1963    Years since quitting: 57.5   Smokeless tobacco: Never  Vaping Use   Vaping Use: Never used  Substance Use Topics   Alcohol use: No    Alcohol/week: 0.0 standard drinks   Drug use: No     Review of Systems  Constitutional: No fever/chills Eyes: No visual changes. No discharge ENT: No upper respiratory complaints. Cardiovascular: palpitations but no chest pain. Respiratory: no cough. No SOB. Gastrointestinal: No abdominal pain.  No nausea, no vomiting.  No diarrhea.  No constipation. Genitourinary: Negative for dysuria. No hematuria Musculoskeletal: Negative for musculoskeletal pain. Skin: Negative for rash, abrasions, lacerations, ecchymosis. Neurological: Negative for headaches, focal weakness or numbness.  10 System ROS otherwise negative.  ____________________________________________   PHYSICAL EXAM:  VITAL SIGNS: ED Triage Vitals  Enc Vitals Group     BP 06/08/21 1312 (!) 161/86     Pulse Rate 06/08/21 1312 (!) 153     Resp 06/08/21 1312 (!) 23     Temp 06/08/21 1312 98.7 F (37.1 C)     Temp Source 06/08/21 1312 Oral     SpO2 06/08/21 1312 96 %     Weight 06/08/21 1309 196 lb 3.4 oz (89 kg)     Height 06/08/21 1309 5' 5"  (1.651 m)     Head Circumference --      Peak Flow --      Pain Score 06/08/21 1309 0     Pain Loc --      Pain Edu? --      Excl. in [place]? --      Constitutional: Alert and oriented. Well appearing and in no acute distress. Eyes: Conjunctivae are normal. PERRL. EOMI. Head: Atraumatic. ENT:      Ears:       Nose: No congestion/rhinnorhea.      Mouth/Throat: Mucous membranes are moist.  Neck: No stridor.  Hematological/Lymphatic/Immunilogical: No cervical lymphadenopathy. Cardiovascular: Tachycardic with irregularly irregular rhythm.  Normal S1 and S2.  Good peripheral circulation. Respiratory: Normal respiratory effort without tachypnea or retractions. Lungs CTAB. Good air entry to the bases  with no decreased or absent  breath sounds. Gastrointestinal: Bowel sounds 4 quadrants. Soft and nontender to palpation. No guarding or rigidity. No palpable masses. No distention. No CVA tenderness. Musculoskeletal: Full range of motion to all extremities. No gross deformities appreciated. Neurologic:  Normal speech and language. No gross focal neurologic deficits are appreciated.  Skin:  Skin is warm, dry and intact. No rash noted. Psychiatric: Mood and affect are normal. Speech and behavior are normal. Patient exhibits appropriate insight and judgement.   ____________________________________________   LABS (all labs ordered are listed, but only abnormal results are displayed)  Labs Reviewed  CBC WITH DIFFERENTIAL/PLATELET - Abnormal; Notable for the following components:      Result Value   Monocytes Absolute 1.2 (*)    All other components within normal limits  URINALYSIS, COMPLETE (UACMP) WITH MICROSCOPIC - Abnormal; Notable for the following components:   Color, Urine STRAW (*)    APPearance CLEAR (*)    Specific Gravity, Urine 1.003 (*)    Hgb urine dipstick SMALL (*)    Leukocytes,Ua TRACE (*)    All other components within normal limits  COMPREHENSIVE METABOLIC PANEL  TROPONIN I (HIGH SENSITIVITY)  TROPONIN I (HIGH SENSITIVITY)   ____________________________________________  EKG  ED ECG REPORT I, Charline Bills Bretton Tandy,  personally viewed and interpreted this ECG.   Date: 06/08/2021  EKG Time: 1310 hrs.  Rate: 147 bpm  Rhythm: atrial fibrillation, rate 147, compared to previous EKG from 04/29/2021, patient now in A. fib with RVR  Axis: Normal axis  Intervals:none  ST&T Change: No ST elevation or depression noted  EKG consistent with A. fib with RVR at a rate of 147.  No ST elevation or depression.  Last EKG I was able to identify was from 04/29/2021.  Patient was in normal sinus rhythm at the time.  ____________________________________________  RADIOLOGY I personally viewed and evaluated  these images as part of my medical decision making, as well as reviewing the written report by the radiologist.  ED Provider Interpretation:   DG Chest Portable 1 View  Result Date: 06/08/2021 CLINICAL DATA:  Pt states she woke up this am and her heart felt like it was bubbling. Pt states strange feeling in mid sternal area. Pt denies any pain. palpitations EXAM: PORTABLE CHEST - 1 VIEW COMPARISON:  03/20/2021 FINDINGS: Lungs are clear. Heart size and mediastinal contours are within normal limits. Aortic Atherosclerosis (ICD10-170.0). No effusion.  No pneumothorax. Visualized bones unremarkable. IMPRESSION: No acute cardiopulmonary disease. Electronically Signed   By: Lucrezia Europe M.D.   On: 06/08/2021 13:40    ____________________________________________    PROCEDURES  Procedure(s) performed:    Procedures    Medications  diltiazem (CARDIZEM) injection 10 mg (10 mg Intravenous Given 06/08/21 1334)  diltiazem (CARDIZEM) injection 10 mg (10 mg Intravenous Given 06/08/21 1437)  diltiazem (CARDIZEM) injection 10 mg (10 mg Intravenous Given 06/08/21 1536)  diltiazem (CARDIZEM CD) 24 hr capsule 120 mg (120 mg Oral Given 06/08/21 1651)     ____________________________________________   INITIAL IMPRESSION / ASSESSMENT AND PLAN / ED COURSE  Pertinent labs & imaging results that were available during my care of the patient were reviewed by me and considered in my medical decision making (see chart for details).  Review of the [place] CSRS was performed in accordance of the [place] prior to dispensing any controlled drugs.  Clinical Course as of 06/08/21 1732  Sun Jun 08, 2021  1615 Patient presented to the ED with palpitations, with concern that  she may have returned into A. fib.  Patient had some burning in her chest yesterday that had gone away without treatment.  Developed some palpitations/bubbling sensation in her chest today.  She arrived in A. fib with RVR.  Initial Cardizem dosing dropped  her heart rate from 1 40-160 to roughly 110-125.  Second dose of Cardizem had dropped her heart rate between 85 and 110 bpm.  Patient remains in A. fib.  I discussed the case with on-call cardiologist, Dr. Marlou Porch who advised that if we could keep the patient's heart rate controlled with medication below 100 she could be discharged with instructions to take double the dose of her at home Cardizem.  If patient remains with rate above 100 he would recommend observation for medication titration.  Patient will have her third dose of Cardizem, will be monitored to see where her heart rate remains. [JC]  1728 Patient's heart rate remained below 100 bpm.  At this time patient will be discharged with oral dose of Cardizem at 120 mg and then instructions to double her Cardizem dosing at home.  Patient will have follow-up with cardiology. [JC]    Clinical Course User Index [JC] Nerida Boivin, Charline Bills, PA-C          Patient's diagnosis is consistent with A. fib.  Patient arrived to the emergency department with a "bubbling" sensation in her chest and feeling "off."  Patient has a history of paroxysmal A. fib and it was identified that patient was in A. fib with RVR on arrival.  Patient had 2 doses of Cardizem which had improved the patient's heart rate but she remains in A. fib.  Patient remains with a rate between 85 and 120 with A. fib.  Discussed the patient with cardiology.  Cardiology recommended providing the patient with another 10 mg dose of Cardizem and if patient's heart rate remains below 100 bpm she would be stable for discharge with increasing her Cardizem dose at home to 240 mg daily.  Patient remains with a heart rate less than 100 bpm, will have a dose of oral Cardizem prior to discharge.  She will have close follow-up with cardiology.  Patient is given ED precautions to return to the ED for any worsening or new symptoms.     ____________________________________________  FINAL CLINICAL  IMPRESSION(S) / ED DIAGNOSES  Final diagnoses:  Atrial fibrillation with RVR ([place])      NEW MEDICATIONS STARTED DURING THIS VISIT:  ED Discharge Orders          Ordered    diltiazem (CARDIZEM CD) 240 MG 24 hr capsule  Daily        06/08/21 1642                This chart was dictated using voice recognition software/Dragon. Despite best efforts to proofread, errors can occur which can change the meaning. Any change was purely unintentional.    Darletta Moll, PA-C 06/08/21 1732    Lavonia Drafts, MD 06/08/21 1733

## 2021-06-08 NOTE — ED Notes (Signed)
Follow up cardiology info provided all questions answered

## 2021-06-08 NOTE — ED Triage Notes (Signed)
Pt via EMS from home. Pt c/o palpitations for about an hour PTA. Pt is on Cardizem and Eliquis and has been taking that. Pt is A&Ox4 and NAD.

## 2021-06-08 NOTE — ED Provider Notes (Signed)
Patient has converted to normal sinus rhythm, heart rate 85   Lavonia Drafts, MD 06/08/21 1642

## 2021-06-08 NOTE — ED Notes (Signed)
Pt states she woke up this am and her heart felt like it was bubbling. Pt states strange feeling in mid sternal area. Pt denies any pain. Pt states some pain yesterday

## 2021-06-09 ENCOUNTER — Telehealth: Payer: Self-pay | Admitting: Cardiovascular Disease

## 2021-06-09 ENCOUNTER — Ambulatory Visit: Payer: Medicare Other | Admitting: Family Medicine

## 2021-06-09 NOTE — Telephone Encounter (Signed)
   Primary Cardiologist: Kathlyn Sacramento, MD  Chart reviewed as part of pre-operative protocol coverage. Given past medical history and time since last visit, based on ACC/AHA guidelines, Tammy Manning would be at acceptable risk for the planned procedure without further cardiovascular testing.   Patient with diagnosis of afib on Eliquis for anticoagulation.     Procedure: hip injections Date of procedure: 06/20/21   CHA2DS2-VASc Score = 6  This indicates a 9.7% annual risk of stroke. The patient's score is based upon: CHF History: No HTN History: Yes Diabetes History: No Stroke History: Yes Vascular Disease History: No Age Score: 2 Gender Score: 1      CrCl 56.9 ml/min Platelet count 354   Patient with a hx of possible TIA in 04/2018   Generally no need to hold anticoagulation prior to a hip injection. However if MD desires a hold, patient may hold 1 day prior.  I will route this recommendation to the requesting party via Epic fax function and remove from pre-op pool.  Please call with questions.  Jossie Ng. Zavier Canela NP-C    06/09/2021, 4:29 PM [facility] [place] [address] Office [phone] Fax [phone]

## 2021-06-09 NOTE — Telephone Encounter (Signed)
Patient with diagnosis of afib on Eliquis for anticoagulation.    Procedure: hip injections Date of procedure: 06/20/21  CHA2DS2-VASc Score = 6  This indicates a 9.7% annual risk of stroke. The patient's score is based upon: CHF History: No HTN History: Yes Diabetes History: No Stroke History: Yes Vascular Disease History: No Age Score: 2 Gender Score: 1     CrCl 56.9 ml/min Platelet count 354  Patient with a hx of possible TIA in 04/2018  Generally no need to hold anticoagulation prior to a hip injection. However if MD desires a hold, patient may hold 1 day prior.

## 2021-06-09 NOTE — Telephone Encounter (Signed)
   [facility] Pre-operative Risk Assessment    Patient Name: Tammy Manning  DOB: 19-Jul-1935 MRN: [id number]  HEARTCARE STAFF:  - IMPORTANT!!!!!! Under Visit Info/Reason for Call, type in Other and utilize the format Clearance MM/DD/YY or Clearance TBD. Do not use dashes or single digits. - Please review there is not already an duplicate clearance open for this procedure. - If request is for dental extraction, please clarify the # of teeth to be extracted. - If the patient is currently at the dentist's office, call Pre-Op Callback Staff (MA/nurse) to input urgent request.  - If the patient is not currently in the dentist office, please route to the Pre-Op pool.  Request for surgical clearance:  What type of surgery is being performed? R hip injection  When is this surgery scheduled? 7/29   What type of clearance is required (medical clearance vs. Pharmacy clearance to hold med vs. Both)? both  Are there any medications that need to be held prior to surgery and how long?   Practice name and name of physician performing surgery? [facility]   What is the office phone number? [phone]   7.   What is the office fax number? [phone]  8.   Anesthesia type (None, local, MAC, general) ? Not noted   Marykay Lex 06/09/2021, 2:30 PM  _________________________________________________________________   (provider comments below)

## 2021-06-09 NOTE — Telephone Encounter (Signed)
Please advise on holding apixaban prior to right hip injection.  Thank you.

## 2021-06-09 NOTE — Telephone Encounter (Addendum)
Pt called back was just getting home and getting settled from leaving the hospital. Appt cancelled for this am with Dr. Darnell Level. Pt did want to r/s with PCP to discuss the vaginal bleeding she was having. Pt wanted to wait for PCP to discuss this. Appt scheduled 06/12/21 (1st available) with Dr. Glori Bickers.   FYI to PCP

## 2021-06-09 NOTE — Telephone Encounter (Signed)
Patient calling in after being in the ED over the weekend again. Patient was seen for irregular HR and had some medication changes. Patient reports her Cardizem was increased and she was given several doses while in ED.   Please advise

## 2021-06-09 NOTE — Telephone Encounter (Addendum)
Per chart review tab pt seen at [facility] ED for afib on 06/08/21. Unable to reach pt by phone; left v/m requesting pt to call [place]. I called [place] and pt is not an inpatient at this time. Sending note to support pool, Dr Darnell Level and Lattie Haw CMA, Dr Glori Bickers as PCP but is out of office and myself.

## 2021-06-09 NOTE — Telephone Encounter (Signed)
[place] Night - Client Nonclinical Telephone Record [facility] brewer [facility] Night - Client Client Site [place] Physician Ria Bush - MD Contact Type Call Who Is Calling Patient / Member / Family / Caregiver Caller Name [place] Phone Number [phone] Patient Name Tammy Manning Patient DOB 09-15-1935 Call Type Message Only Information Provided Reason for Call Request to [place] Appointment Initial Comment Caller is in the hospital and needs to cancel her appointment tomorrow. Patient request to speak to RN No Disp. Time Disposition Final User 06/08/2021 4:44:48 PM General Information Provided Yes Ronnald Ramp, [place] Call Closed By: Eliane Decree Transaction Date/Time: 06/08/2021 4:42:44 PM (ET)

## 2021-06-09 NOTE — Telephone Encounter (Signed)
Will see her then

## 2021-06-09 NOTE — Telephone Encounter (Signed)
Spoke with the patient. Advised the patient that I will fwd the update to Dr. Fletcher Anon. Patient sts that she is a bit tired today but she is in NSR. Adv the patient that she should get some rest and to contact our office if needed.

## 2021-06-12 ENCOUNTER — Encounter: Payer: Self-pay | Admitting: Family Medicine

## 2021-06-12 ENCOUNTER — Other Ambulatory Visit: Payer: Self-pay

## 2021-06-12 ENCOUNTER — Ambulatory Visit (INDEPENDENT_AMBULATORY_CARE_PROVIDER_SITE_OTHER): Payer: Medicare Other | Admitting: Family Medicine

## 2021-06-12 ENCOUNTER — Other Ambulatory Visit (HOSPITAL_COMMUNITY)
Admission: RE | Admit: 2021-06-12 | Discharge: 2021-06-12 | Disposition: A | Discharge status: Home / Self Care | Payer: Medicare Other | Source: Ambulatory Visit | Attending: Family Medicine | Admitting: Family Medicine

## 2021-06-12 VITALS — BP 150/72 | HR 79 | Temp 97.9°F | Ht 65.0 in | Wt 191.0 lb

## 2021-06-12 DIAGNOSIS — Z01411 Encounter for gynecological examination (general) (routine) with abnormal findings: Secondary | ICD-10-CM | POA: Insufficient documentation

## 2021-06-12 DIAGNOSIS — Z1151 Encounter for screening for human papillomavirus (HPV): Secondary | ICD-10-CM | POA: Diagnosis not present

## 2021-06-12 DIAGNOSIS — R8290 Unspecified abnormal findings in urine: Secondary | ICD-10-CM | POA: Diagnosis not present

## 2021-06-12 DIAGNOSIS — N939 Abnormal uterine and vaginal bleeding, unspecified: Secondary | ICD-10-CM | POA: Insufficient documentation

## 2021-06-12 DIAGNOSIS — R319 Hematuria, unspecified: Secondary | ICD-10-CM

## 2021-06-12 LAB — POC URINALSYSI DIPSTICK (AUTOMATED)
Bilirubin, UA: NEGATIVE
Glucose, UA: NEGATIVE
Ketones, UA: NEGATIVE
Nitrite, UA: NEGATIVE
Protein, UA: NEGATIVE
Spec Grav, UA: 1.01 (ref 1.010–1.025)
Urobilinogen, UA: 0.2 E.U./dL
pH, UA: 6 (ref 5.0–8.0)

## 2021-06-12 NOTE — Assessment & Plan Note (Signed)
Blood noted when wiping Trace on ua /also leukocytes  Pending culture   Suspect blood is from vagina (per exam)

## 2021-06-12 NOTE — Patient Instructions (Signed)
Continue current medications  You will likely note some more blood with wiping  If bleeding becomes much more let us know  Will update when test results return

## 2021-06-12 NOTE — Telephone Encounter (Addendum)
Patient made aware pf Dr. Tyrell Antonio response. She will monitor her BP and HR daily for 1 week and then call our office with the readings. Adv the patient to contact the office sooner if needed.  Patient verbalized understanding and voiced appreciation for the call.

## 2021-06-12 NOTE — Telephone Encounter (Signed)
Please ask her to monitor her heart rate and blood pressure daily over the next week and call us with the results.

## 2021-06-12 NOTE — Assessment & Plan Note (Signed)
Small area of abrasion/bleeding noted on exam  Did pap of that area-pend report On eliquis  inst to update if symptoms worsen

## 2021-06-12 NOTE — Progress Notes (Signed)
Subjective:    Patient ID: Tammy Manning, female    DOB: 1935-02-21, 86 y.o.   MRN: [id number]  This visit occurred during the SARS-CoV-2 public health emergency.  Safety protocols were in place, including screening questions prior to the visit, additional usage of staff PPE, and extensive cleaning of exam room while observing appropriate contact time as indicated for disinfecting solutions.   HPI Pt presents with c/o vaginal bleeding/spotting   Wt Readings from Last 3 Encounters:  06/12/21 191 lb (86.6 kg)  06/08/21 196 lb 3.4 oz (89 kg)  06/05/21 194 lb 8 oz (88.2 kg)   31.78 kg/m    Pt called on 7/15 noting pink liquid vaginal spotting for a week Saw on underwear and toilet tissue (pink)   No uti sympomts  Not rectal but she tends to see the pink after she has a bm   No pelvic pain  No vaginal changes at all  No discharge or odor   No burning to urinate More frequent urination but also drinking more water  Tying to get her ankle swelling down  No flank pain  Never had a kidney stone   Not sexually active   H/o total hysterectomy at age 11 ago (fibroids initially) , then ovarian cysts  Takes eliquis for a fib   Incidentally was in ER for a fib on 7/17   BP Readings from Last 3 Encounters:  06/12/21 (!) 150/72  06/08/21 109/85  06/05/21 140/70   Pulse Readings from Last 3 Encounters:  06/12/21 79  06/08/21 84  06/05/21 77    Results for orders placed or performed in visit on 06/12/21  POCT Urinalysis Dipstick (Automated)  Result Value Ref Range   Color, UA yellow    Clarity, UA clear    Glucose, UA Negative Negative   Bilirubin, UA Negative    Ketones, UA Negative    Spec Grav, UA 1.010 1.010 - 1.025   Blood, UA trace intact    pH, UA 6.0 5.0 - 8.0   Protein, UA Negative Negative   Urobilinogen, UA 0.2 0.2 or 1.0 E.U./dL   Nitrite, UA Neg    Leukocytes, UA Large (3+) (A) Negative     Patient Active Problem List   Diagnosis Date Noted    Blood urine 06/12/2021   Vaginal bleeding 06/12/2021   Atrial fibrillation with RVR (HCC) 04/28/2021   Hypertrophic toenail 04/22/2021   Cough 03/24/2021   Pedal edema 03/24/2021   Leg pain, left 03/24/2021   Elevated rheumatoid factor 11/25/2020   Bilateral hand pain 11/25/2020   Pain in right shoulder 10/16/2020   Joint pain 10/16/2020   Joint swelling 10/16/2020   Estrogen deficiency 04/30/2020   Low back pain 03/15/2020   Dry mouth 03/15/2020   Osteoarthritis of right hip 01/02/2020   Groin pain, right 01/01/2020   Trochanteric bursitis of right hip 11/09/2018   Transient global amnesia 05/12/2018   Right thyroid nodule 05/12/2018   History of TIA (transient ischemic attack) 05/09/2018   Aortic insufficiency 09/10/2015   Stress reaction 09/08/2015   Encounter for Medicare annual wellness exam 02/28/2013   Bucket-handle tear of lateral meniscus of left knee as current injury 12/30/2012   Episodic atrial fibrillation ([place]) 11/17/2012   Special screening for malignant neoplasms, colon 01/21/2012   Hearing loss of both ears 01/14/2012   Adverse drug reaction 03/12/2011   HEADACHE, CHRONIC 09/24/2010   B12 deficiency 03/17/2010   DYSPEPSIA 03/17/2010   POSTMENOPAUSAL STATUS 08/20/2008   Essential  hypertension 07/19/2007   Hyperlipidemia 06/24/2007   CONSTIPATION 06/24/2007   IBS 06/24/2007   DERMATITIS, ATOPIC 06/24/2007   SKIN CANCER, HX OF 06/24/2007   Past Medical History:  Diagnosis Date   Anginal pain ([place])    Aortic insufficiency    a. 02/2020 Echo: EF 60-65%, no rwma, Gr1 DD, nl RV fxn, mildly dil LA, triv AI, Asc Ao 4m.   Arthritis    Knees   B12 deficiency    Mild   Bucket-handle tear of lateral meniscus of left knee as current injury 12/30/2012   Cancer ([place]    Skin, basal cell on nose and eyelid   DJD (degenerative joint disease)    Low back   Dyspepsia    GERD (gastroesophageal reflux disease)    Heart murmur    Heart palpitations    History of  cardiac cath    a. 02/2017 Cath: Nl cors. Nl LV fxn. Abd Aogram w/o RAS.   HOH (hard of hearing)    Hyperlipidemia    Hypertension    Lactose intolerance    Mixed incontinence    PONV (postoperative nausea and vomiting)    Vulvar irritation    from urine pad, uses Triamcinolone oint.   Past Surgical History:  Procedure Laterality Date   ABDOMINAL AORTOGRAM N/A 03/15/2017   Procedure: Abdominal Aortogram;  Surgeon: MWellington Hampshire MD;  [facility];  Service: Cardiovascular;  Laterality: N/A;   ABDOMINAL HYSTERECTOMY  1982   Large fibroids and bleeding   APPENDECTOMY     BILATERAL SALPINGOOPHORECTOMY  1986   BONE GRAFT HIP ILIAC CREST     To Left arm   BREAST REDUCTION SURGERY     BREAST SURGERY     CARDIAC CATHETERIZATION     CATARACT EXTRACTION W/PHACO Left 07/07/2016   Procedure: CATARACT EXTRACTION PHACO AND INTRAOCULAR LENS PLACEMENT (IOC);  Surgeon: WBirder Robson MD;  Location: [facility];  Service: Ophthalmology;  Laterality: Left;  [place]:10 AP% 18.3 CDE 12.91 Fluid pak lot # [id number]   CATARACT EXTRACTION W/PHACO Right 07/28/2016   Procedure: CATARACT EXTRACTION PHACO AND INTRAOCULAR LENS PLACEMENT (IOC);  Surgeon: WBirder Robson MD;  Location: [facility];  Service: Ophthalmology;  Laterality: Right;  [place]:26 AP% 18.5 CDE 16.12 Fluid pack lot # [id number]   COLONOSCOPY  07/2016   Dr. MThana Farr  DILATION AND CURETTAGE OF UTERUS     miscarragesx2   ESOPHAGOGASTRODUODENOSCOPY     Polyps   HEMORRHOID SURGERY  2006   KNEE ARTHROSCOPY WITH LATERAL MENISECTOMY Left 12/30/2012   Procedure: KNEE ARTHROSCOPY WITH LATERAL MENISECTOMY;  Surgeon: JJohnny Bridge MD;  [facility]  Service: Orthopedics;  Laterality: Left;  LEFT KNEE SCOPE LATERAL MENISCECTOMY   LEFT HEART CATH AND CORONARY ANGIOGRAPHY N/A 03/15/2017   Procedure: Left Heart Cath and Coronary Angiography;  Surgeon: MWellington Hampshire MD;  [facility];  Service:  Cardiovascular;  Laterality: N/A;   MOUTH SURGERY     skin cancer eyelid  2012   TONSILLECTOMY     Social History   Tobacco Use   Smoking status: Former    Packs/day: 1.00    Years: 12.00    Pack years: 12.00    Types: Cigarettes    Quit date: 11/24/1963    Years since quitting: 57.5   Smokeless tobacco: Never  Vaping Use   Vaping Use: Never used  Substance Use Topics   Alcohol use: No    Alcohol/week:  0.0 standard drinks   Drug use: No   Family History  Problem Relation Age of Onset   Cancer Mother        Colon   Heart disease Father        CAD   Diabetes Sister    Hypothyroidism Sister    Diabetes Brother    Leukemia Brother    Esophageal cancer Brother    Cancer Paternal Aunt        Breast   Diabetes Sister    Alzheimer's disease Sister    Diabetes Brother    Breast cancer Daughter    Allergies  Allergen Reactions   Antihistamines, Chlorpheniramine-Type Other (See Comments)    Reaction:  Makes pt hyper    Atenolol Hypertension   Cefuroxime Axetil Other (See Comments)    Upset stomach   Ciprofloxacin Other (See Comments)    Upset stomach   Co Q 10 [Coenzyme Q10] Other (See Comments)    Reaction:  GI upset    Crestor [Rosuvastatin Calcium] Other (See Comments)    Reaction:  Myalgias    Dextromethorphan-Guaifenesin Other (See Comments)    Reaction:  Made pt jittery    Epinephrine Hives   Ramipril Other (See Comments)    Upset stomach   Sulfamethoxazole-Trimethoprim Other (See Comments)    Upset stomach   Vitamin D Analogs Other (See Comments)    Reaction:  GI upset    Current Outpatient Medications on File Prior to Visit  Medication Sig Dispense Refill   acetaminophen (TYLENOL) 500 MG tablet Take 500 mg by mouth every 6 (six) hours as needed.     apixaban (ELIQUIS) 5 MG TABS tablet Take 1 tablet (5 mg total) by mouth 2 (two) times daily. 180 tablet 0   atorvastatin (LIPITOR) 10 MG tablet TAKE 1 TABLET BY MOUTH EVERY DAY AT 6 PM 90 tablet 0   Calcium  Carbonate-Vitamin D (CALCIUM 600+D PO) Take 1 tablet by mouth daily.      clobetasol ointment (TEMOVATE) 0.62 % Apply 1 application topically 2 (two) times daily as needed.     clorazepate (TRANXENE) 7.5 MG tablet Take 1 tablet (7.5 mg total) by mouth daily as needed. 30 tablet 1   cyanocobalamin (,VITAMIN B-12,) 1000 MCG/ML injection Inject 1,000 mcg into the muscle every 30 (thirty) days.      diltiazem (CARDIZEM CD) 240 MG 24 hr capsule Take 1 capsule (240 mg total) by mouth daily. 30 capsule 1   irbesartan (AVAPRO) 300 MG tablet TAKE 1 TABLET BY MOUTH DAILY 90 tablet 3   omeprazole (PRILOSEC) 20 MG capsule Take 1 capsule (20 mg total) by mouth 2 (two) times daily as needed. 180 capsule 1   triamcinolone (NASACORT) 55 MCG/ACT AERO nasal inhaler Place 2 sprays into the nose daily. 1 Inhaler 12   No current facility-administered medications on file prior to visit.    Review of Systems  Constitutional:  Negative for activity change, appetite change, fatigue, fever and unexpected weight change.  HENT:  Negative for congestion, ear pain, rhinorrhea, sinus pressure and sore throat.   Eyes:  Negative for pain, redness and visual disturbance.  Respiratory:  Negative for cough, shortness of breath and wheezing.   Cardiovascular:  Negative for chest pain and palpitations.  Gastrointestinal:  Negative for abdominal pain, blood in stool, constipation and diarrhea.  Endocrine: Negative for polydipsia and polyuria.  Genitourinary:  Positive for vaginal bleeding. Negative for dysuria, frequency and urgency.  Musculoskeletal:  Negative for arthralgias, back pain  and myalgias.  Skin:  Negative for pallor and rash.  Allergic/Immunologic: Negative for environmental allergies.  Neurological:  Negative for dizziness, syncope and headaches.  Hematological:  Negative for adenopathy. Does not bruise/bleed easily.  Psychiatric/Behavioral:  Negative for decreased concentration and dysphoric mood. The patient is  not nervous/anxious.       Objective:   Physical Exam Constitutional:      General: She is not in acute distress.    Appearance: Normal appearance. She is obese. She is not ill-appearing.  Eyes:     General: No scleral icterus.    Conjunctiva/sclera: Conjunctivae normal.     Pupils: Pupils are equal, round, and reactive to light.  Cardiovascular:     Rate and Rhythm: Normal rate and regular rhythm.     Heart sounds: Normal heart sounds.  Pulmonary:     Effort: Pulmonary effort is normal. No respiratory distress.     Breath sounds: Normal breath sounds. No wheezing.  Abdominal:     General: Abdomen is flat. Bowel sounds are normal. There is no distension.     Tenderness: There is no abdominal tenderness.     Comments: No suprapubic tenderness or fullness    Genitourinary:    Labia:        Right: No rash, tenderness, lesion or injury.        Left: No rash, tenderness, lesion or injury.      Urethra: No prolapse, urethral pain, urethral swelling or urethral lesion.     Vagina: Bleeding and lesions present. No vaginal discharge or tenderness.     Comments: Posterior vaginal area appears to be abraded and bleeding  Pap obtained  No tenderness Skin:    General: Skin is warm and dry.     Coloration: Skin is not pale.     Findings: No erythema.  Neurological:     Mental Status: She is alert.  Psychiatric:        Mood and Affect: Mood normal.          Assessment & Plan:

## 2021-06-13 DIAGNOSIS — R8290 Unspecified abnormal findings in urine: Secondary | ICD-10-CM | POA: Diagnosis not present

## 2021-06-13 DIAGNOSIS — R319 Hematuria, unspecified: Secondary | ICD-10-CM | POA: Diagnosis not present

## 2021-06-13 NOTE — Addendum Note (Signed)
Addended by: Tammi Sou on: 06/13/2021 12:40 PM   Modules accepted: Orders

## 2021-06-15 LAB — URINE CULTURE
MICRO NUMBER:: 12152415
SPECIMEN QUALITY:: ADEQUATE

## 2021-06-16 ENCOUNTER — Ambulatory Visit: Payer: Medicare Other | Admitting: Family Medicine

## 2021-06-16 ENCOUNTER — Telehealth: Payer: Self-pay | Admitting: *Deleted

## 2021-06-16 MED ORDER — CEPHALEXIN 250 MG PO CAPS: 250.0000 mg | ORAL_CAPSULE | Freq: Two times a day (BID) | ORAL | 0 refills | Status: DC

## 2021-06-16 NOTE — Telephone Encounter (Signed)
-----   Message from Abner Greenspan, MD sent at 06/16/2021 12:46 PM EDT ----- Urine grew out pseudomonas  We need to treat  Can she take keflex?  If so please send in keflex 250 mg bid for 7 d #14 no refills Keep up a good fluid intake

## 2021-06-16 NOTE — Telephone Encounter (Signed)
Pt notified of Urine cx results and Dr. Marliss Coots comments. Pt can take keflex so Rx sent to pharmacy and pt advise to keep [place] posted if sxs don't improve after finishing abx

## 2021-06-17 LAB — CYTOLOGY - PAP
Comment: NEGATIVE
Diagnosis: UNDETERMINED — AB
High risk HPV: NEGATIVE

## 2021-06-17 NOTE — Telephone Encounter (Signed)
Orthopaedics office calling back stating they are not requesting any medication to be held, the patient only wanted [place] to know what medications were going to be used for hip injection.

## 2021-06-19 ENCOUNTER — Telehealth: Payer: Self-pay | Admitting: Family Medicine

## 2021-06-19 DIAGNOSIS — N939 Abnormal uterine and vaginal bleeding, unspecified: Secondary | ICD-10-CM

## 2021-06-19 NOTE — Telephone Encounter (Signed)
-----   Message from Randall An, RN sent at 06/19/2021  4:07 PM EDT ----- Advised pt of results and PCP msg. Pt does not have preference of GYN but would like to be seen in [place] and not [place].  Pt denies any further bleeding. Advised if she had any bleeding again or she developed and new symptoms to contact the office. Pt verbalized understanding.

## 2021-06-20 DIAGNOSIS — M25551 Pain in right hip: Secondary | ICD-10-CM | POA: Diagnosis not present

## 2021-06-26 ENCOUNTER — Encounter: Payer: Self-pay | Admitting: Family Medicine

## 2021-06-26 MED ORDER — CLORAZEPATE DIPOTASSIUM 7.5 MG PO TABS: 7.5000 mg | ORAL_TABLET | Freq: Every day | ORAL | 1 refills | Status: DC | PRN

## 2021-06-26 NOTE — Telephone Encounter (Signed)
Name of Medication: Clorazepate Name of Pharmacy: [facility] or Written Date and Quantity:02/18/21 #30 / 1 refills  Last Office Visit and Type: Vaginal Bleeding 06/12/21 Next Office Visit and Type: f/u 07/04/21

## 2021-06-30 ENCOUNTER — Ambulatory Visit: Payer: Medicare Other | Admitting: Podiatry

## 2021-07-03 ENCOUNTER — Telehealth: Payer: Self-pay | Admitting: Cardiovascular Disease

## 2021-07-03 NOTE — Telephone Encounter (Signed)
Placed on Dr. Tyrell Antonio desk for him to review

## 2021-07-03 NOTE — Telephone Encounter (Signed)
Patient dropped off BP readings put in box

## 2021-07-04 ENCOUNTER — Ambulatory Visit (INDEPENDENT_AMBULATORY_CARE_PROVIDER_SITE_OTHER): Payer: Medicare Other | Admitting: Family Medicine

## 2021-07-04 ENCOUNTER — Other Ambulatory Visit: Payer: Self-pay

## 2021-07-04 ENCOUNTER — Encounter: Payer: Self-pay | Admitting: Family Medicine

## 2021-07-04 VITALS — BP 124/68 | HR 90 | Temp 98.2°F | Ht 65.0 in | Wt 189.1 lb

## 2021-07-04 DIAGNOSIS — I10 Essential (primary) hypertension: Secondary | ICD-10-CM | POA: Diagnosis not present

## 2021-07-04 DIAGNOSIS — E538 Deficiency of other specified B group vitamins: Secondary | ICD-10-CM

## 2021-07-04 DIAGNOSIS — R319 Hematuria, unspecified: Secondary | ICD-10-CM | POA: Diagnosis not present

## 2021-07-04 DIAGNOSIS — N939 Abnormal uterine and vaginal bleeding, unspecified: Secondary | ICD-10-CM

## 2021-07-04 MED ORDER — CYANOCOBALAMIN 1000 MCG/ML IJ SOLN
1000.0000 ug | Freq: Once | INTRAMUSCULAR | Status: AC
  Administered 2021-07-04: 1000 ug via INTRAMUSCULAR

## 2021-07-04 NOTE — Patient Instructions (Signed)
Keep drinking fluids  Follow up with gyn as planned   Blood pressure looks good   I will message your cardiologist about the chest heaviness  Then get back to you   Take care of yourself

## 2021-07-04 NOTE — Assessment & Plan Note (Signed)
This has resolved Pap of vagina showed ascus (neg HPV) Has gyn visit planned later this month

## 2021-07-04 NOTE — Telephone Encounter (Signed)
Blood pressure and heart rate readings look very reasonable.  We should continue same medications.

## 2021-07-04 NOTE — Assessment & Plan Note (Signed)
Urine culture was pos for pseudomonas and treated with keflex  Symptoms resolved

## 2021-07-04 NOTE — Assessment & Plan Note (Signed)
BP is well controlled in setting of episodic a fib BP: 124/68  Taking diltiazem 240 mg daily  avapro 300 mg daily   Pulse of 90 Pt notes that this dose of diltiazem makes her feel full in the chest and throat and fatigued She cannot take beta blockers  Will reach out to cardiology to see what recommendation is

## 2021-07-04 NOTE — Progress Notes (Signed)
Subjective:    Patient ID: Tammy Manning, female    DOB: 1934/12/18, 86 y.o.   MRN: [id number]  This visit occurred during the SARS-CoV-2 public health emergency.  Safety protocols were in place, including screening questions prior to the visit, additional usage of staff PPE, and extensive cleaning of exam room while observing appropriate contact time as indicated for disinfecting solutions.   HPI Pt presents for f/u uti/ vag bleeding / chronic medical problems incl HTN and B12 def   Wt Readings from Last 3 Encounters:  07/04/21 189 lb 2 oz (85.8 kg)  06/12/21 191 lb (86.6 kg)  06/08/21 196 lb 3.4 oz (89 kg)   31.47 kg/m  Last visit was seen for vaginal bleeding  Pap of vagina saw some atypical cells  Has appt with gyn on 8/26  No bleeding or irritation or pain   Has uti with pseudomonas  Tx with keflex  Much better overall  Drinking water   Had cardizem inc in the hospital  Causes heaviness in the chest /hard to breathe    BP Readings from Last 3 Encounters:  07/04/21 124/68  06/12/21 (!) 150/72  06/08/21 109/85   Pulse Readings from Last 3 Encounters:  07/04/21 90  06/12/21 79  06/08/21 84   Diltiazem 240 mg daily  Avapro 300   Cannot take a beta blocker   Pulse ox is 97%  Sees cardiology 9/13  Otherwise doing ok  Emotionally - fair  Occ takes tranzene 1/2 pill    Lab Results  Component Value Date   CREATININE 0.81 06/08/2021   BUN 14 06/08/2021   NA 136 06/08/2021   K 4.0 06/08/2021   CL 104 06/08/2021   CO2 23 06/08/2021   Patient Active Problem List   Diagnosis Date Noted   Blood urine 06/12/2021   Vaginal bleeding 06/12/2021   Atrial fibrillation with RVR ([place]) 04/28/2021   Hypertrophic toenail 04/22/2021   Cough 03/24/2021   Pedal edema 03/24/2021   Leg pain, left 03/24/2021   Elevated rheumatoid factor 11/25/2020   Bilateral hand pain 11/25/2020   Pain in right shoulder 10/16/2020   Joint pain 10/16/2020   Joint swelling  10/16/2020   Estrogen deficiency 04/30/2020   Low back pain 03/15/2020   Dry mouth 03/15/2020   Osteoarthritis of right hip 01/02/2020   Groin pain, right 01/01/2020   Trochanteric bursitis of right hip 11/09/2018   Transient global amnesia 05/12/2018   Right thyroid nodule 05/12/2018   History of TIA (transient ischemic attack) 05/09/2018   Aortic insufficiency 09/10/2015   Stress reaction 09/08/2015   Encounter for Medicare annual wellness exam 02/28/2013   Bucket-handle tear of lateral meniscus of left knee as current injury 12/30/2012   Episodic atrial fibrillation ([place]) 11/17/2012   Special screening for malignant neoplasms, colon 01/21/2012   Hearing loss of both ears 01/14/2012   HEADACHE, CHRONIC 09/24/2010   B12 deficiency 03/17/2010   DYSPEPSIA 03/17/2010   POSTMENOPAUSAL STATUS 08/20/2008   Essential hypertension 07/19/2007   Hyperlipidemia 06/24/2007   CONSTIPATION 06/24/2007   IBS 06/24/2007   DERMATITIS, ATOPIC 06/24/2007   SKIN CANCER, HX OF 06/24/2007   Past Medical History:  Diagnosis Date   Anginal pain ([place])    Aortic insufficiency    a. 02/2020 Echo: EF 60-65%, no rwma, Gr1 DD, nl RV fxn, mildly dil LA, triv AI, Asc Ao 72m.   Arthritis    Knees   B12 deficiency    Mild   Bucket-handle tear of  lateral meniscus of left knee as current injury 12/30/2012   Cancer ([place])    Skin, basal cell on nose and eyelid   DJD (degenerative joint disease)    Low back   Dyspepsia    GERD (gastroesophageal reflux disease)    Heart murmur    Heart palpitations    History of cardiac cath    a. 02/2017 Cath: Nl cors. Nl LV fxn. Abd Aogram w/o RAS.   HOH (hard of hearing)    Hyperlipidemia    Hypertension    Lactose intolerance    Mixed incontinence    PONV (postoperative nausea and vomiting)    Vulvar irritation    from urine pad, uses Triamcinolone oint.   Past Surgical History:  Procedure Laterality Date   ABDOMINAL AORTOGRAM N/A 03/15/2017   Procedure:  Abdominal Aortogram;  Surgeon: Wellington Hampshire, MD;  Location: [facility];  Service: Cardiovascular;  Laterality: N/A;   ABDOMINAL HYSTERECTOMY  1982   Large fibroids and bleeding   APPENDECTOMY     BILATERAL SALPINGOOPHORECTOMY  1986   BONE GRAFT HIP ILIAC CREST     To Left arm   BREAST REDUCTION SURGERY     BREAST SURGERY     CARDIAC CATHETERIZATION     CATARACT EXTRACTION W/PHACO Left 07/07/2016   Procedure: CATARACT EXTRACTION PHACO AND INTRAOCULAR LENS PLACEMENT (IOC);  Surgeon: Birder Robson, MD;  Location: [facility];  Service: Ophthalmology;  Laterality: Left;  [place] 01:10 AP% 18.3 CDE 12.91 Fluid pak lot # [id number]   CATARACT EXTRACTION W/PHACO Right 07/28/2016   Procedure: CATARACT EXTRACTION PHACO AND INTRAOCULAR LENS PLACEMENT (IOC);  Surgeon: Birder Robson, MD;  Location: [facility];  Service: Ophthalmology;  Laterality: Right;  [place] 01:26 AP% 18.5 CDE 16.12 Fluid pack lot # [id number]   COLONOSCOPY  07/2016   Dr. Thana Farr   DILATION AND CURETTAGE OF UTERUS     miscarragesx2   ESOPHAGOGASTRODUODENOSCOPY     Polyps   HEMORRHOID SURGERY  2006   KNEE ARTHROSCOPY WITH LATERAL MENISECTOMY Left 12/30/2012   Procedure: KNEE ARTHROSCOPY WITH LATERAL MENISECTOMY;  Surgeon: Johnny Bridge, MD;  Location: [facility];  Service: Orthopedics;  Laterality: Left;  LEFT KNEE SCOPE LATERAL MENISCECTOMY   LEFT HEART CATH AND CORONARY ANGIOGRAPHY N/A 03/15/2017   Procedure: Left Heart Cath and Coronary Angiography;  Surgeon: Wellington Hampshire, MD;  Location: [facility];  Service: Cardiovascular;  Laterality: N/A;   MOUTH SURGERY     skin cancer eyelid  2012   TONSILLECTOMY     Social History   Tobacco Use   Smoking status: Former    Packs/day: 1.00    Years: 12.00    Pack years: 12.00    Types: Cigarettes    Quit date: 11/24/1963    Years since quitting: 57.6   Smokeless tobacco: Never  Vaping Use   Vaping Use: Never used  Substance Use Topics   Alcohol  use: No    Alcohol/week: 0.0 standard drinks   Drug use: No   Family History  Problem Relation Age of Onset   Cancer Mother        Colon   Heart disease Father        CAD   Diabetes Sister    Hypothyroidism Sister    Diabetes Brother    Leukemia Brother    Esophageal cancer Brother    Cancer Paternal Aunt        Breast   Diabetes Sister  Alzheimer's disease Sister    Diabetes Brother    Breast cancer Daughter    Allergies  Allergen Reactions   Antihistamines, Chlorpheniramine-Type Other (See Comments)    Reaction:  Makes pt hyper    Atenolol Hypertension   Cefuroxime Axetil Other (See Comments)    Upset stomach   Ciprofloxacin Other (See Comments)    Upset stomach   Co Q 10 [Coenzyme Q10] Other (See Comments)    Reaction:  GI upset    Crestor [Rosuvastatin Calcium] Other (See Comments)    Reaction:  Myalgias    Dextromethorphan-Guaifenesin Other (See Comments)    Reaction:  Made pt jittery    Epinephrine Hives   Ramipril Other (See Comments)    Upset stomach   Sulfamethoxazole-Trimethoprim Other (See Comments)    Upset stomach   Vitamin D Analogs Other (See Comments)    Reaction:  GI upset    Current Outpatient Medications on File Prior to Visit  Medication Sig Dispense Refill   acetaminophen (TYLENOL) 500 MG tablet Take 500 mg by mouth every 6 (six) hours as needed.     apixaban (ELIQUIS) 5 MG TABS tablet Take 1 tablet (5 mg total) by mouth 2 (two) times daily. 180 tablet 0   atorvastatin (LIPITOR) 10 MG tablet TAKE 1 TABLET BY MOUTH EVERY DAY AT 6 PM 90 tablet 0   Calcium Carbonate-Vitamin D (CALCIUM 600+D PO) Take 1 tablet by mouth daily.      clobetasol ointment (TEMOVATE) 9.14 % Apply 1 application topically 2 (two) times daily as needed.     clorazepate (TRANXENE) 7.5 MG tablet Take 1 tablet (7.5 mg total) by mouth daily as needed. 30 tablet 1   cyanocobalamin (,VITAMIN B-12,) 1000 MCG/ML injection Inject 1,000 mcg into the muscle every 30 (thirty) days.       diltiazem (CARDIZEM CD) 240 MG 24 hr capsule Take 1 capsule (240 mg total) by mouth daily. 30 capsule 1   irbesartan (AVAPRO) 300 MG tablet TAKE 1 TABLET BY MOUTH DAILY 90 tablet 3   omeprazole (PRILOSEC) 20 MG capsule Take 1 capsule (20 mg total) by mouth 2 (two) times daily as needed. 180 capsule 1   triamcinolone (NASACORT) 55 MCG/ACT AERO nasal inhaler Place 2 sprays into the nose daily. 1 Inhaler 12   No current facility-administered medications on file prior to visit.     Review of Systems  Constitutional:  Positive for fatigue. Negative for activity change, appetite change, fever and unexpected weight change.  HENT:  Negative for congestion, ear pain, rhinorrhea, sinus pressure and sore throat.        Throat felt thick with inc diltiazem dose  Eyes:  Negative for pain, redness and visual disturbance.  Respiratory:  Negative for cough, shortness of breath and wheezing.        Chest feels thick/heavy  Not sob    Cardiovascular:  Negative for chest pain, palpitations and leg swelling.  Gastrointestinal:  Negative for abdominal pain, blood in stool, constipation and diarrhea.  Endocrine: Negative for polydipsia and polyuria.  Genitourinary:  Negative for dysuria, frequency and urgency.  Musculoskeletal:  Negative for arthralgias, back pain and myalgias.  Skin:  Negative for pallor and rash.  Allergic/Immunologic: Negative for environmental allergies.  Neurological:  Negative for dizziness, syncope and headaches.  Hematological:  Negative for adenopathy. Does not bruise/bleed easily.  Psychiatric/Behavioral:  Negative for decreased concentration and dysphoric mood. The patient is not nervous/anxious.       Objective:   Physical Exam  Constitutional:      General: She is not in acute distress.    Appearance: Normal appearance. She is well-developed. She is obese. She is not ill-appearing or diaphoretic.  HENT:     Head: Normocephalic and atraumatic.     Mouth/Throat:      Mouth: Mucous membranes are moist.     Comments: No mouth/throat swelling Eyes:     General: No scleral icterus.    Conjunctiva/sclera: Conjunctivae normal.     Pupils: Pupils are equal, round, and reactive to light.  Neck:     Thyroid: No thyromegaly.     Vascular: No carotid bruit or JVD.  Cardiovascular:     Rate and Rhythm: Normal rate and regular rhythm.     Heart sounds: Normal heart sounds.    No gallop.  Pulmonary:     Effort: Pulmonary effort is normal. No respiratory distress.     Breath sounds: Normal breath sounds. No stridor. No wheezing, rhonchi or rales.     Comments: No wheeze even on forced exp Chest:     Chest wall: No tenderness.  Abdominal:     General: Bowel sounds are normal. There is no distension or abdominal bruit.     Palpations: Abdomen is soft. There is no mass.     Tenderness: There is no abdominal tenderness.  Musculoskeletal:     Cervical back: Normal range of motion and neck supple.     Right lower leg: No edema.     Left lower leg: No edema.  Lymphadenopathy:     Cervical: No cervical adenopathy.  Skin:    General: Skin is warm and dry.     Coloration: Skin is not pale.     Findings: No rash.  Neurological:     Mental Status: She is alert.     Coordination: Coordination normal.     Deep Tendon Reflexes: Reflexes are normal and symmetric. Reflexes normal.  Psychiatric:        Mood and Affect: Mood normal.        Cognition and Memory: Cognition and memory normal.     Comments: Pleasant  Positive attitude           Assessment & Plan:   Problem List Items Addressed This Visit       Cardiovascular and Mediastinum   Essential hypertension - Primary    BP is well controlled in setting of episodic a fib BP: 124/68  Taking diltiazem 240 mg daily  avapro 300 mg daily   Pulse of 90 Pt notes that this dose of diltiazem makes her feel full in the chest and throat and fatigued She cannot take beta blockers  Will reach out to  cardiology to see what recommendation is          Other   B12 deficiency    B12 shot today  Continue monthly       Blood urine    Urine culture was pos for pseudomonas and treated with keflex  Symptoms resolved       Vaginal bleeding    This has resolved Pap of vagina showed ascus (neg HPV) Has gyn visit planned later this month

## 2021-07-04 NOTE — Assessment & Plan Note (Signed)
B12 shot today  Continue monthly

## 2021-07-06 ENCOUNTER — Other Ambulatory Visit: Payer: Self-pay | Admitting: Family Medicine

## 2021-07-06 NOTE — Telephone Encounter (Signed)
Please let pt know that her cardiologist wants her to go down to 180 mg for the diltiazem CD I pended it to send to pharmacy  Let us know if that does not help the chest symptoms or if bp goes up   Thanks

## 2021-07-06 NOTE — Telephone Encounter (Signed)
-----   Message from Wellington Hampshire, MD sent at 07/04/2021  1:53 PM EDT ----- I recommend decreasing diltiazem extended release to 180 mg once daily.   ----- Message ----- From: Abner Greenspan, MD Sent: 07/04/2021   9:28 AM EDT To: Wellington Hampshire, MD  Ms Tammy Manning c/o heavy feeling chest since her cardizem was increased and she cannot take beta blockers  BP is well controlled  Any suggestion?   Thanks!

## 2021-07-07 MED ORDER — DILTIAZEM HCL ER COATED BEADS 180 MG PO CP24: 180.0000 mg | ORAL_CAPSULE | Freq: Every day | ORAL | 2 refills | Status: DC

## 2021-07-07 NOTE — Telephone Encounter (Signed)
Tammy Hampshire, MD  [name], [name], MD; Lamar Laundry, RN I recommend decreasing diltiazem extended release to 180 mg once daily.

## 2021-07-07 NOTE — Telephone Encounter (Signed)
Patient made aware of Dr. Tyrell Antonio recommendation to decrease diltiazem to 172m daily. Patient is agreeable. Patient sts that she will monitor her BP daily x 2 weeks and drop the readings off at our office for Dr. AFletcher Anonto review. Adv the patient to contact our office sooner if needed. Patient verbalized understanding and voiced appreciation for the call.

## 2021-07-09 NOTE — Telephone Encounter (Signed)
Left VM requesting pt to call the office back 

## 2021-07-10 ENCOUNTER — Other Ambulatory Visit: Payer: Self-pay

## 2021-07-10 ENCOUNTER — Encounter: Payer: Self-pay | Admitting: Family Medicine

## 2021-07-10 ENCOUNTER — Other Ambulatory Visit (INDEPENDENT_AMBULATORY_CARE_PROVIDER_SITE_OTHER): Payer: Medicare Other

## 2021-07-10 ENCOUNTER — Other Ambulatory Visit: Payer: Self-pay | Admitting: Family Medicine

## 2021-07-10 DIAGNOSIS — R300 Dysuria: Secondary | ICD-10-CM

## 2021-07-10 DIAGNOSIS — R8290 Unspecified abnormal findings in urine: Secondary | ICD-10-CM

## 2021-07-10 LAB — POC URINALSYSI DIPSTICK (AUTOMATED)
Bilirubin, UA: NEGATIVE
Blood, UA: 200
Glucose, UA: NEGATIVE
Ketones, UA: NEGATIVE
Nitrite, UA: POSITIVE
Protein, UA: POSITIVE — AB
Spec Grav, UA: 1.02 (ref 1.010–1.025)
Urobilinogen, UA: 0.2 E.U./dL
pH, UA: 6 (ref 5.0–8.0)

## 2021-07-10 MED ORDER — CEPHALEXIN 500 MG PO CAPS: 500.0000 mg | ORAL_CAPSULE | Freq: Two times a day (BID) | ORAL | 0 refills | Status: DC

## 2021-07-10 NOTE — Telephone Encounter (Signed)
Pt called returning your call 

## 2021-07-10 NOTE — Telephone Encounter (Signed)
Pt will stop by and drop off a urine sample

## 2021-07-10 NOTE — Telephone Encounter (Signed)
Meloxicam not on med list anymore but last filled on 12/06/20 #90/ 0 refills

## 2021-07-10 NOTE — Telephone Encounter (Signed)
This is not good to mix with eliquis , not recommended due to risk of bleeding / GI ulcers

## 2021-07-10 NOTE — Telephone Encounter (Signed)
Pt said cardiologist sent new dose of med in for her already and she can already tell her chest issues are improving.  FYI to PCP

## 2021-07-10 NOTE — Addendum Note (Signed)
Addended by: Loura Pardon A on: 07/10/2021 04:01 PM   Modules accepted: Orders

## 2021-07-11 NOTE — Telephone Encounter (Signed)
Patient notified as instructed by telephone. Patient stated that she has told the pharmacy that she does not take this anymore and she does not know why they keep requesting it. Medication denied and note sent to pharmacy.

## 2021-07-13 LAB — URINE CULTURE
MICRO NUMBER:: 12260882
SPECIMEN QUALITY:: ADEQUATE

## 2021-07-18 ENCOUNTER — Ambulatory Visit (INDEPENDENT_AMBULATORY_CARE_PROVIDER_SITE_OTHER): Payer: Medicare Other | Admitting: Obstetrics and Gynecology

## 2021-07-18 ENCOUNTER — Encounter: Payer: Self-pay | Admitting: Obstetrics and Gynecology

## 2021-07-18 ENCOUNTER — Other Ambulatory Visit: Payer: Self-pay

## 2021-07-18 VITALS — BP 136/74 | HR 79 | Ht 65.0 in | Wt 189.9 lb

## 2021-07-18 DIAGNOSIS — N950 Postmenopausal bleeding: Secondary | ICD-10-CM

## 2021-07-18 DIAGNOSIS — N952 Postmenopausal atrophic vaginitis: Secondary | ICD-10-CM

## 2021-07-18 DIAGNOSIS — R87610 Atypical squamous cells of undetermined significance on cytologic smear of cervix (ASC-US): Secondary | ICD-10-CM | POA: Diagnosis not present

## 2021-07-18 MED ORDER — ESTRADIOL 0.1 MG/GM VA CREA: 0.2500 | TOPICAL_CREAM | Freq: Every day | VAGINAL | 3 refills | Status: DC

## 2021-07-18 NOTE — Progress Notes (Signed)
Referral pt- pt denies any vaginal bleeding or pain. Pt stated that during her physical exam with her primary doctor she noticed bleeding in the vaginal area. Pt state that she has never seen the bleeding only her provider.

## 2021-07-18 NOTE — Progress Notes (Signed)
HPI:      [name] is a 87 y.o. [phone] who LMP was No LMP recorded. Patient has had a hysterectomy.  Subjective:   She presents today for postmenopausal bleeding.  This bleeding started a very short time after beginning anticoagulation therapy for A. fib.  She was seen by her PCP noticed a small amount of bleeding.  Since that time the patient denies any vaginal bleeding. Of significant note patient has had a hysterectomy. A recent Pap smear showed ASCUS with negative HPV (obviously vaginal cuff) She does state that she is occasionally uncomfortable with vaginal dryness and lack of lubrication. (She is not sexually active) She was recently treated for bladder infection but denies urinary bleeding even during this infection.  Patient denies rectal bleeding. She is specifically requesting not to be examined today unless necessary.    Hx: The following portions of the patient's history were reviewed and updated as appropriate:             She  has a past medical history of Anginal pain ([place]), Aortic insufficiency, Arthritis, B12 deficiency, Bucket-handle tear of lateral meniscus of left knee as current injury (12/30/2012), Cancer ([place]), DJD (degenerative joint disease), Dyspepsia, GERD (gastroesophageal reflux disease), Heart murmur, Heart palpitations, History of cardiac cath, HOH (hard of hearing), Hyperlipidemia, Hypertension, Lactose intolerance, Mixed incontinence, PONV (postoperative nausea and vomiting), and Vulvar irritation. She does not have any pertinent problems on file. She  has a past surgical history that includes Abdominal hysterectomy (1982); Bilateral salpingoophorectomy (1986); Bone graft hip iliac crest; Appendectomy; Breast reduction surgery; Hemorrhoid surgery (2006); Esophagogastroduodenoscopy; skin cancer eyelid (2012); Tonsillectomy; Dilation and curettage of uterus; Knee arthroscopy with lateral menisectomy (Left, 12/30/2012); Mouth surgery; Cataract extraction w/PHACO  (Left, 07/07/2016); Breast surgery; Cataract extraction w/PHACO (Right, 07/28/2016); LEFT HEART CATH AND CORONARY ANGIOGRAPHY (N/A, 03/15/2017); ABDOMINAL AORTOGRAM (N/A, 03/15/2017); Colonoscopy (07/2016); and Cardiac catheterization. Her family history includes Alzheimer's disease in her sister; Breast cancer in her daughter; Cancer in her mother and paternal aunt; Diabetes in her brother, brother, sister, and sister; Esophageal cancer in her brother; Heart disease in her father; Hypothyroidism in her sister; Leukemia in her brother. She  reports that she quit smoking about 57 years ago. Her smoking use included cigarettes. She has a 12.00 pack-year smoking history. She has never used smokeless tobacco. She reports that she does not drink alcohol and does not use drugs. She has a current medication list which includes the following prescription(s): acetaminophen, apixaban, atorvastatin, calcium carbonate-vitamin d, clobetasol ointment, clorazepate, cyanocobalamin, diltiazem, irbesartan, omeprazole, triamcinolone, and estradiol. She is allergic to antihistamines, chlorpheniramine-type; atenolol; cefuroxime axetil; ciprofloxacin; co q 10 [coenzyme q10]; crestor [rosuvastatin calcium]; dextromethorphan-guaifenesin; epinephrine; ramipril; sulfamethoxazole-trimethoprim; and vitamin d analogs.       Review of Systems:  Review of Systems  Constitutional: Denied constitutional symptoms, night sweats, recent illness, fatigue, fever, insomnia and weight loss.  Eyes: Denied eye symptoms, eye pain, photophobia, vision change and visual disturbance.  Ears/Nose/Throat/Neck: Denied ear, nose, throat or neck symptoms, hearing loss, nasal discharge, sinus congestion and sore throat.  Cardiovascular: Denied cardiovascular symptoms, arrhythmia, chest pain/pressure, edema, exercise intolerance, orthopnea and palpitations.  Respiratory: Denied pulmonary symptoms, asthma, pleuritic pain, productive sputum, cough, dyspnea and  wheezing.  Gastrointestinal: Denied, gastro-esophageal reflux, melena, nausea and vomiting.  Genitourinary: See HPI for additional information.  Musculoskeletal: Denied musculoskeletal symptoms, stiffness, swelling, muscle weakness and myalgia.  Dermatologic: Denied dermatology symptoms, rash and scar.  Neurologic: Denied neurology symptoms, dizziness, headache, neck pain and syncope.  Psychiatric: Denied psychiatric symptoms, anxiety and depression.  Endocrine: Denied endocrine symptoms including hot flashes and night sweats.   Meds:   Current Outpatient Medications on File Prior to Visit  Medication Sig Dispense Refill   acetaminophen (TYLENOL) 500 MG tablet Take 500 mg by mouth every 6 (six) hours as needed.     apixaban (ELIQUIS) 5 MG TABS tablet Take 1 tablet (5 mg total) by mouth 2 (two) times daily. 180 tablet 0   atorvastatin (LIPITOR) 10 MG tablet TAKE 1 TABLET BY MOUTH EVERY DAY AT 6 PM 90 tablet 0   Calcium Carbonate-Vitamin D (CALCIUM 600+D PO) Take 1 tablet by mouth daily.      clobetasol ointment (TEMOVATE) 4.62 % Apply 1 application topically 2 (two) times daily as needed.     clorazepate (TRANXENE) 7.5 MG tablet Take 1 tablet (7.5 mg total) by mouth daily as needed. 30 tablet 1   cyanocobalamin (,VITAMIN B-12,) 1000 MCG/ML injection Inject 1,000 mcg into the muscle every 30 (thirty) days.      diltiazem (CARDIZEM CD) 180 MG 24 hr capsule Take 1 capsule (180 mg total) by mouth daily. 30 capsule 2   irbesartan (AVAPRO) 300 MG tablet TAKE 1 TABLET BY MOUTH DAILY 90 tablet 3   omeprazole (PRILOSEC) 20 MG capsule Take 1 capsule (20 mg total) by mouth 2 (two) times daily as needed. 180 capsule 1   triamcinolone (NASACORT) 55 MCG/ACT AERO nasal inhaler Place 2 sprays into the nose daily. 1 Inhaler 12   No current facility-administered medications on file prior to visit.      Objective:     Vitals:   07/18/21 0907  BP: 136/74  Pulse: 79   Filed Weights   07/18/21 0907   Weight: 189 lb 14.4 oz (86.1 kg)                        Assessment:    [id number] Patient Active Problem List   Diagnosis Date Noted   Blood urine 06/12/2021   Vaginal bleeding 06/12/2021   Atrial fibrillation with RVR ([facility]) 04/28/2021   Hypertrophic toenail 04/22/2021   Cough 03/24/2021   Pedal edema 03/24/2021   Leg pain, left 03/24/2021   Elevated rheumatoid factor 11/25/2020   Bilateral hand pain 11/25/2020   Pain in right shoulder 10/16/2020   Joint pain 10/16/2020   Joint swelling 10/16/2020   Estrogen deficiency 04/30/2020   Low back pain 03/15/2020   Dry mouth 03/15/2020   Osteoarthritis of right hip 01/02/2020   Groin pain, right 01/01/2020   Trochanteric bursitis of right hip 11/09/2018   Transient global amnesia 05/12/2018   Right thyroid nodule 05/12/2018   History of TIA (transient ischemic attack) 05/09/2018   Aortic insufficiency 09/10/2015   Stress reaction 09/08/2015   Encounter for Medicare annual wellness exam 02/28/2013   Bucket-handle tear of lateral meniscus of left knee as current injury 12/30/2012   Episodic atrial fibrillation ([place]) 11/17/2012   Special screening for malignant neoplasms, colon 01/21/2012   Hearing loss of both ears 01/14/2012   HEADACHE, CHRONIC 09/24/2010   B12 deficiency 03/17/2010   DYSPEPSIA 03/17/2010   POSTMENOPAUSAL STATUS 08/20/2008   Essential hypertension 07/19/2007   Hyperlipidemia 06/24/2007   CONSTIPATION 06/24/2007   IBS 06/24/2007   DERMATITIS, ATOPIC 06/24/2007   SKIN CANCER, HX OF 06/24/2007     1. Postmenopausal bleeding   2. Atrophic vaginitis   3. Atypical squamous cells of undetermined significance on cytologic smear of cervix (  ASC-US)     Without uterus or cervix and small amount of bleeding noted to be coincident with anticoagulation therapy makes it extremely likely that this is atrophic vaginitis.  As a patient has had no further bleeding in a while this seems the most likely diagnosis. As she  has somewhat symptomatic with atrophic vaginitis she is desiring treatment.  Vaginal cuff ASCUS with negative HPV requires no further investigation.   Plan:            1.  I have reassured her regarding postmenopausal bleeding and its likely association with anticoagulation and atrophic vaginitis.  She was chosen to try Estrace cream 2-3 times per week.  I expect no further vaginal bleeding.  2.  No further investigation is required for vaginal cuff ASCUS with negative HPV.  Orders No orders of the defined types were placed in this encounter.    Meds ordered this encounter  Medications   estradiol (ESTRACE) 0.1 MG/GM vaginal cream    Sig: Place 3.50 Applicatorfuls vaginally at bedtime.    Dispense:  90 g    Refill:  3      F/U  Return in about 3 months (around 10/18/2021). I spent 31 minutes involved in the care of this patient preparing to see the patient by obtaining and reviewing her medical history (including labs, imaging tests and prior procedures), documenting clinical information in the electronic health record (EHR), counseling and coordinating care plans, writing and sending prescriptions, ordering tests or procedures and in direct communicating with the patient and medical staff discussing pertinent items from her history and physical exam.  Finis Bud, M.D. 07/18/2021 9:40 AM

## 2021-07-21 DIAGNOSIS — M170 Bilateral primary osteoarthritis of knee: Secondary | ICD-10-CM | POA: Diagnosis not present

## 2021-07-22 ENCOUNTER — Encounter: Payer: Self-pay | Admitting: Family Medicine

## 2021-07-22 DIAGNOSIS — M25551 Pain in right hip: Secondary | ICD-10-CM | POA: Diagnosis not present

## 2021-07-22 DIAGNOSIS — M25511 Pain in right shoulder: Secondary | ICD-10-CM | POA: Diagnosis not present

## 2021-07-22 MED ORDER — NITROFURANTOIN MONOHYD MACRO 100 MG PO CAPS: 100.0000 mg | ORAL_CAPSULE | Freq: Two times a day (BID) | ORAL | 0 refills | Status: DC

## 2021-08-04 DIAGNOSIS — M25511 Pain in right shoulder: Secondary | ICD-10-CM | POA: Diagnosis not present

## 2021-08-05 ENCOUNTER — Encounter: Payer: Self-pay | Admitting: Nurse Practitioner

## 2021-08-05 ENCOUNTER — Other Ambulatory Visit: Payer: Self-pay

## 2021-08-05 ENCOUNTER — Ambulatory Visit (INDEPENDENT_AMBULATORY_CARE_PROVIDER_SITE_OTHER): Payer: Medicare Other | Admitting: Nurse Practitioner

## 2021-08-05 VITALS — BP 134/60 | HR 93 | Ht 64.0 in | Wt 188.0 lb

## 2021-08-05 DIAGNOSIS — I77810 Thoracic aortic ectasia: Secondary | ICD-10-CM | POA: Diagnosis not present

## 2021-08-05 DIAGNOSIS — E785 Hyperlipidemia, unspecified: Secondary | ICD-10-CM

## 2021-08-05 DIAGNOSIS — I480 Paroxysmal atrial fibrillation: Secondary | ICD-10-CM

## 2021-08-05 DIAGNOSIS — I10 Essential (primary) hypertension: Secondary | ICD-10-CM

## 2021-08-05 NOTE — Progress Notes (Signed)
Office Visit    Patient Name: Tammy Manning Date of Encounter: 08/05/2021  Primary Care Provider:  Abner Greenspan, MD Primary Cardiologist:  Kathlyn Sacramento, MD  Chief Complaint    86 year old female with a history of aortic valve insufficiency, hypertension with intolerance to multiple medications, hyperlipidemia, normal coronary arteries by catheterization 0272, grade 1 diastolic dysfunction, mild aortic root dilatation at 39 mm, B12 deficiency, and osteoarthritis who follows up today for PAF.  Past Medical History    Past Medical History:  Diagnosis Date   Anginal pain ([place])    Aortic insufficiency    a. 02/2020 Echo: EF 60-65%, no rwma, Gr1 DD, nl RV fxn, mildly dil LA, triv AI, Asc Ao 66m.   Arthritis    Knees   B12 deficiency    Mild   Bucket-handle tear of lateral meniscus of left knee as current injury 12/30/2012   Cancer ([place]    Skin, basal cell on nose and eyelid   DJD (degenerative joint disease)    Low back   Dyspepsia    GERD (gastroesophageal reflux disease)    Heart murmur    Heart palpitations    History of cardiac cath    a. 02/2017 Cath: Nl cors. Nl LV fxn. Abd Aogram w/o RAS.   HOH (hard of hearing)    Hyperlipidemia    Hypertension    Lactose intolerance    Mixed incontinence    PONV (postoperative nausea and vomiting)    Vulvar irritation    from urine pad, uses Triamcinolone oint.   Past Surgical History:  Procedure Laterality Date   ABDOMINAL AORTOGRAM N/A 03/15/2017   Procedure: Abdominal Aortogram;  Surgeon: MWellington Hampshire MD;  [facility];  Service: Cardiovascular;  Laterality: N/A;   ABDOMINAL HYSTERECTOMY  1982   Large fibroids and bleeding   APPENDECTOMY     BILATERAL SALPINGOOPHORECTOMY  1986   BONE GRAFT HIP ILIAC CREST     To Left arm   BREAST REDUCTION SURGERY     BREAST SURGERY     CARDIAC CATHETERIZATION     CATARACT EXTRACTION W/PHACO Left 07/07/2016   Procedure: CATARACT EXTRACTION PHACO AND  INTRAOCULAR LENS PLACEMENT (IOC);  Surgeon: WBirder Robson MD;  Location: [facility];  Service: Ophthalmology;  Laterality: Left;  [place]:10 AP% 18.3 CDE 12.91 Fluid pak lot # [id number]   CATARACT EXTRACTION W/PHACO Right 07/28/2016   Procedure: CATARACT EXTRACTION PHACO AND INTRAOCULAR LENS PLACEMENT (IOC);  Surgeon: WBirder Robson MD;  Location: [facility];  Service: Ophthalmology;  Laterality: Right;  [place]01:26 AP% 18.5 CDE 16.12 Fluid pack lot # [id number]   COLONOSCOPY  07/2016   Dr. MThana Farr  DILATION AND CURETTAGE OF UTERUS     miscarragesx2   ESOPHAGOGASTRODUODENOSCOPY     Polyps   HEMORRHOID SURGERY  2006   KNEE ARTHROSCOPY WITH LATERAL MENISECTOMY Left 12/30/2012   Procedure: KNEE ARTHROSCOPY WITH LATERAL MENISECTOMY;  Surgeon: JJohnny Bridge MD;  [facility]  Service: Orthopedics;  Laterality: Left;  LEFT KNEE SCOPE LATERAL MENISCECTOMY   LEFT HEART CATH AND CORONARY ANGIOGRAPHY N/A 03/15/2017   Procedure: Left Heart Cath and Coronary Angiography;  Surgeon: MWellington Hampshire MD;  [facility];  Service: Cardiovascular;  Laterality: N/A;   MOUTH SURGERY     skin cancer eyelid  2012   TONSILLECTOMY      Allergies  Allergies  Allergen Reactions   Antihistamines, Chlorpheniramine-Type Other (See Comments)  Reaction:  Makes pt hyper    Atenolol Hypertension   Cefuroxime Axetil Other (See Comments)    Upset stomach   Ciprofloxacin Other (See Comments)    Upset stomach   Co Q 10 [Coenzyme Q10] Other (See Comments)    Reaction:  GI upset    Crestor [Rosuvastatin Calcium] Other (See Comments)    Reaction:  Myalgias    Dextromethorphan-Guaifenesin Other (See Comments)    Reaction:  Made pt jittery    Epinephrine Hives   Ramipril Other (See Comments)    Upset stomach   Sulfamethoxazole-Trimethoprim Other (See Comments)    Upset stomach   Vitamin D Analogs Other (See Comments)    Reaction:  GI upset     History of Present  Illness    86 year old female with the above past medical history including aortic insufficiency, hypertension with intolerance to multiple antihypertensives, hyperlipidemia, normal coronary arteries by catheterization 5003, grade 1 diastolic dysfunction, mild aortic root dilatation at 39 mm, B12 deficiency, and osteoarthritis.  Echocardiogram April 2021 showed normal LV function with trivial AI, and mildly dilated aortic root at 39 mm.  In June of this year, she was seen in the emergency department with complaints of leg edema, fatigue, and palpitations.  She was found to be in atrial fibrillation with rapid ventricular spots.  She converted to sinus rhythm on diltiazem infusion and was noted to be mildly hypokalemic.  She was placed on diltiazem and Eliquis therapy.  Home dose of chlorthalidone was discontinued in the setting of hypokalemia.  An echocardiogram was performed and showed an EF of 60 to 65% with mildly dilated left atrium and mildly calcified aortic valve.  She was doing well at office follow-up on July 14.  Unfortunately, she was seen in the emergency department again on July 17 with recurrent atrial fibrillation and was given IV diltiazem with eventual conversion to sinus rhythm.  Her home dose of diltiazem was increased to 240 mg daily however, this was subsequently reduced to 180 mg daily in mid August secondary to concerns about side effects and drops in blood pressure.  Since the reduction in her diltiazem dose to 180 mg daily, she has done well.  She has not had any tachycardia, chest pain, or dyspnea.  She sometimes notes a "bubbling sensation" in the center of her chest after meals which she thinks might represent palpitations though these are generally short-lived and do not feel like prior episodes of atrial fibrillation.  She has not checked her heart rate during those episodes.  She denies PND, orthopnea, dizziness, syncope, edema, or early satiety.  She feels that she is tolerating  Eliquis and diltiazem 180 mg well.  Home Medications    Current Outpatient Medications  Medication Sig Dispense Refill   acetaminophen (TYLENOL) 500 MG tablet Take 500 mg by mouth every 6 (six) hours as needed.     apixaban (ELIQUIS) 5 MG TABS tablet Take 1 tablet (5 mg total) by mouth 2 (two) times daily. 180 tablet 0   atorvastatin (LIPITOR) 10 MG tablet TAKE 1 TABLET BY MOUTH EVERY DAY AT 6 PM 90 tablet 0   Calcium Carbonate-Vitamin D (CALCIUM 600+D PO) Take 1 tablet by mouth daily.      clobetasol ointment (TEMOVATE) 7.04 % Apply 1 application topically 2 (two) times daily as needed.     clorazepate (TRANXENE) 7.5 MG tablet Take 1 tablet (7.5 mg total) by mouth daily as needed. 30 tablet 1   cyanocobalamin (,VITAMIN B-12,) 1000 MCG/ML  injection Inject 1,000 mcg into the muscle every 30 (thirty) days.      diltiazem (CARDIZEM CD) 180 MG 24 hr capsule Take 1 capsule (180 mg total) by mouth daily. 30 capsule 2   irbesartan (AVAPRO) 300 MG tablet TAKE 1 TABLET BY MOUTH DAILY 90 tablet 3   MAGNESIUM LACTATE PO Take by mouth 2 (two) times daily.     omeprazole (PRILOSEC) 20 MG capsule Take 1 capsule (20 mg total) by mouth 2 (two) times daily as needed. 180 capsule 1   triamcinolone (NASACORT) 55 MCG/ACT AERO nasal inhaler Place 2 sprays into the nose daily. 1 Inhaler 12   No current facility-administered medications for this visit.     Review of Systems    No recurrent A. fib that she is aware of though does have a bubbling sensation in her chest sometimes after meals.  She denies chest pain, dyspnea, PND, orthopnea, dizziness, syncope, edema, or early satiety.  All other systems reviewed and are otherwise negative except as noted above.  Physical Exam    VS:  BP 134/60 (BP Location: Left Arm, Patient Position: Sitting, Cuff Size: Large)   Pulse 93   Ht 5' 4"  (1.626 m)   Wt 188 lb (85.3 kg)   SpO2 96%   BMI 32.27 kg/m  , BMI Body mass index is 32.27 kg/m.     GEN: Well nourished,  well developed, in no acute distress. HEENT: normal. Neck: Supple, no JVD, carotid bruits, or masses. Cardiac: RRR, no murmurs, rubs, or gallops. No clubbing, cyanosis, edema.  Radials/DP/PT 2+ and equal bilaterally.  Respiratory:  Respirations regular and unlabored, clear to auscultation bilaterally. GI: Soft, nontender, nondistended, BS + x 4. MS: no deformity or atrophy. Skin: warm and dry, no rash. Neuro:  Strength and sensation are intact. Psych: Normal affect.  Accessory Clinical Findings    ECG personally reviewed by me today -regular sinus rhythm, 93- no acute changes.  Lab Results  Component Value Date   WBC 8.9 06/08/2021   HGB 12.2 06/08/2021   HCT 37.0 06/08/2021   MCV 94.6 06/08/2021   PLT 354 06/08/2021   Lab Results  Component Value Date   CREATININE 0.81 06/08/2021   BUN 14 06/08/2021   NA 136 06/08/2021   K 4.0 06/08/2021   CL 104 06/08/2021   CO2 23 06/08/2021   Lab Results  Component Value Date   ALT 16 06/08/2021   AST 22 06/08/2021   ALKPHOS 58 06/08/2021   BILITOT 0.9 06/08/2021   Lab Results  Component Value Date   CHOL 201 (H) 04/25/2020   HDL 66.80 04/25/2020   LDLCALC 107 (H) 04/25/2020   LDLDIRECT 106 (H) 02/02/2020   TRIG 132.0 04/25/2020   CHOLHDL 3 04/25/2020    Lab Results  Component Value Date   HGBA1C 5.7 (H) 05/10/2018    Assessment & Plan    1.  Paroxysmal atrial fibrillation: Diagnosed in June with recurrent A. fib in July prompting ED visit.  She has been maintained on diltiazem 180 mg daily and anticoagulated Eliquis 5 mg twice daily.  Lab work in July showed stable H&H and renal function.  She has not had any recurrence of tachycardia since her July visit and is tolerating medications well at this point.  She does note occasional bubbling sensations in her chest following meals and I recommended that she check her heart rate during these episodes, and if elevated, we could consider monitoring to better assess.  2.  Essential hypertension: Relatively stable today at 134/60.  She says that she is typically in the 120s at home.  Continue diltiazem and irbesartan.  3.  Hyperlipidemia: LDL of 107 in June 2021.  She remains on low-dose atorvastatin and has not tolerated higher doses previously.  4.  Dilated aortic root: 39 mm by echo in April 2021 with plan to follow-up echo next year.  5.  Disposition: Follow-up in clinic in 4 months or sooner if necessary.   Murray Hodgkins, NP 08/05/2021, 5:59 PM

## 2021-08-05 NOTE — Patient Instructions (Signed)
Medication Instructions:  - Your physician recommends that you continue on your current medications as directed. Please refer to the Current Medication list given to you today.  *If you need a refill on your cardiac medications before your next appointment, please call your pharmacy*   Lab Work: - none ordered  If you have labs (blood work) drawn today and your tests are completely normal, you will receive your results only by: [place] (if you have MyChart) OR A paper copy in the mail If you have any lab test that is abnormal or we need to change your treatment, we will call you to review the results.   Testing/Procedures: - none ordered   Follow-Up: At [facility], you and your health needs are our priority.  As part of our continuing mission to provide you with exceptional heart care, we have created designated Provider Care Teams.  These Care Teams include your primary Cardiologist (physician) and Advanced Practice Providers (APPs -  Physician Assistants and Nurse Practitioners) who all work together to provide you with the care you need, when you need it.  We recommend signing up for the patient portal called "MyChart".  Sign up information is provided on this After Visit Summary.  MyChart is used to connect with patients for Virtual Visits (Telemedicine).  Patients are able to view lab/test results, encounter notes, upcoming appointments, etc.  Non-urgent messages can be sent to your provider as well.   To learn more about what you can do with MyChart, go to [email or web address].    Your next appointment:   4 month(s)  The format for your next appointment:   In Person  Provider:   You may see Kathlyn Sacramento, MD or one of the following Advanced Practice Providers on your designated Care Team:   Murray Hodgkins, NP    Other Instructions N/a

## 2021-08-06 ENCOUNTER — Other Ambulatory Visit: Payer: Self-pay

## 2021-08-06 ENCOUNTER — Ambulatory Visit (INDEPENDENT_AMBULATORY_CARE_PROVIDER_SITE_OTHER): Payer: Medicare Other

## 2021-08-06 DIAGNOSIS — E538 Deficiency of other specified B group vitamins: Secondary | ICD-10-CM | POA: Diagnosis not present

## 2021-08-06 MED ORDER — CYANOCOBALAMIN 1000 MCG/ML IJ SOLN
1000.0000 ug | Freq: Once | INTRAMUSCULAR | Status: AC
  Administered 2021-08-06: 1000 ug via INTRAMUSCULAR

## 2021-08-06 NOTE — Progress Notes (Signed)
Per orders of Allie Bossier, in absence of Dr. Glori Bickers, injection of B12 given by Loreen Freud. Patient tolerated injection well.

## 2021-08-12 ENCOUNTER — Encounter: Payer: Self-pay | Admitting: Family Medicine

## 2021-08-12 MED ORDER — DILTIAZEM HCL ER COATED BEADS 180 MG PO CP24: 180.0000 mg | ORAL_CAPSULE | Freq: Every day | ORAL | 1 refills | Status: DC

## 2021-08-12 MED ORDER — NYSTATIN 100000 UNIT/ML MT SUSP: OROMUCOSAL | 0 refills | Status: DC

## 2021-08-21 ENCOUNTER — Other Ambulatory Visit: Payer: Self-pay | Admitting: *Deleted

## 2021-08-21 MED ORDER — APIXABAN 5 MG PO TABS: 5.0000 mg | ORAL_TABLET | Freq: Two times a day (BID) | ORAL | 1 refills | Status: DC

## 2021-08-21 NOTE — Telephone Encounter (Signed)
Prescription refill request for Eliquis received. Indication: PAF Last office visit: 08/05/21  C Burge PA-C Scr: 0.81 on 06/08/21 Age: 86 Weight: 85.3kg  Based on above findings Eliquis 60m twice daily is the appropriate dose.  Refill approved.

## 2021-09-02 DIAGNOSIS — M25551 Pain in right hip: Secondary | ICD-10-CM | POA: Diagnosis not present

## 2021-09-02 DIAGNOSIS — M25511 Pain in right shoulder: Secondary | ICD-10-CM | POA: Diagnosis not present

## 2021-09-02 DIAGNOSIS — M542 Cervicalgia: Secondary | ICD-10-CM | POA: Diagnosis not present

## 2021-09-05 ENCOUNTER — Encounter: Payer: Self-pay | Admitting: Family Medicine

## 2021-09-05 DIAGNOSIS — Z23 Encounter for immunization: Secondary | ICD-10-CM | POA: Diagnosis not present

## 2021-09-09 ENCOUNTER — Ambulatory Visit (INDEPENDENT_AMBULATORY_CARE_PROVIDER_SITE_OTHER): Payer: Medicare Other

## 2021-09-09 ENCOUNTER — Other Ambulatory Visit: Payer: Self-pay

## 2021-09-09 DIAGNOSIS — E538 Deficiency of other specified B group vitamins: Secondary | ICD-10-CM

## 2021-09-09 MED ORDER — CYANOCOBALAMIN 1000 MCG/ML IJ SOLN
1000.0000 ug | Freq: Once | INTRAMUSCULAR | Status: AC
  Administered 2021-09-09: 1000 ug via INTRAMUSCULAR

## 2021-09-09 NOTE — Progress Notes (Signed)
Per orders of Dr. Glori Bickers, injection of B12 was given by Ophelia Shoulder. Patient tolerated injection well.

## 2021-09-15 DIAGNOSIS — M1712 Unilateral primary osteoarthritis, left knee: Secondary | ICD-10-CM | POA: Diagnosis not present

## 2021-09-17 ENCOUNTER — Telehealth: Payer: Self-pay | Admitting: Cardiovascular Disease

## 2021-09-17 NOTE — Telephone Encounter (Signed)
Covering preop today. Will route to pharm for input on Eliquis then pt will need call. Last OV 07/2021.

## 2021-09-17 NOTE — Telephone Encounter (Signed)
Patient with diagnosis of afib on Eliquis for anticoagulation.    Procedure: left total knee replacement Date of procedure: 11/25/21  CHA2DS2-VASc Score = 6  This indicates a 9.7% annual risk of stroke. The patient's score is based upon: CHF History: 0 HTN History: 1 Diabetes History: 0 Stroke History: 2 Vascular Disease History: 0 Age Score: 2 Gender Score: 1  In June 2019, presented to hospital secondary for transient confusion, treated as TIA. MRI negative for acute infarct, pt started on aspirin. Afib not dx until June 2022, pt started on Eliquis at that time.  CrCl 36m/min using adjusted body weight Platelet count 354K  Per office protocol, patient can hold Eliquis for 3 days prior to procedure.

## 2021-09-17 NOTE — Telephone Encounter (Signed)
   [facility] Pre-operative Risk Assessment    Patient Name: Tammy Manning  DOB: May 11, 1935 MRN: [id number]  HEARTCARE STAFF:  - IMPORTANT!!!!!! Under Visit Info/Reason for Call, type in Other and utilize the format Clearance MM/DD/YY or Clearance TBD. Do not use dashes or single digits. - Please review there is not already an duplicate clearance open for this procedure. - If request is for dental extraction, please clarify the # of teeth to be extracted. - If the patient is currently at the dentist's office, call Pre-Op Callback Staff (MA/nurse) to input urgent request.  - If the patient is not currently in the dentist office, please route to the Pre-Op pool.  Request for surgical clearance:  What type of surgery is being performed? Left Total knee replacement  When is this surgery scheduled? 11/25/21  What type of clearance is required (medical clearance vs. Pharmacy clearance to hold med vs. Both)? both  Are there any medications that need to be held prior to surgery and how long? Not noted  Practice name and name of physician performing surgery? [facility]  What is the office phone number? [phone]   7.   What is the office fax number? [phone]  8.   Anesthesia type (None, local, MAC, general) ? Spinal with adductor block   Pilar A Ham 09/17/2021, 10:57 AM  _________________________________________________________________   (provider comments below)

## 2021-09-18 NOTE — Telephone Encounter (Signed)
   Name: Renesmay Nesbitt  DOB: 18-Aug-1935  MRN: [id number]   Primary Cardiologist: Kathlyn Sacramento, MD  Chart reviewed as part of pre-operative protocol coverage. Patient was contacted 09/18/2021 in reference to pre-operative risk assessment for pending surgery as outlined below.  Ever Halberg was last seen on 08/05/21 by Ignacia Bayley, NP.  Since that day, Eron Goble has done well from a cardiac standpoint. Her knee pain has slowed her down a little bit, limiting her ability to go up stair, though she continues to walk daily and do household chores/ADLs without issues. She can complete 4 METs without anginal complaints.  Therefore, based on ACC/AHA guidelines, the patient would be at acceptable risk for the planned procedure without further cardiovascular testing.   The patient was advised that if she develops new symptoms prior to surgery to contact our office to arrange for a follow-up visit, and she verbalized understanding.  Per pharmacy recommendations, patient can hold eliquis 3 days prior to her upcoming procedure with plans to restart as soon as she is cleared to do so by her surgeon.   I will route this recommendation to the requesting party via Epic fax function and remove from pre-op pool. Please call with questions.  Abigail Butts, PA-C 09/18/2021, 9:31 AM

## 2021-09-23 ENCOUNTER — Ambulatory Visit: Payer: Medicare Other | Admitting: Family Medicine

## 2021-09-25 ENCOUNTER — Encounter: Payer: Self-pay | Admitting: Family Medicine

## 2021-09-25 ENCOUNTER — Telehealth: Payer: Self-pay | Admitting: *Deleted

## 2021-09-25 NOTE — Telephone Encounter (Signed)
Pt has apt on 12.8.5  with dr tower

## 2021-09-25 NOTE — Telephone Encounter (Signed)
Pt needs surgical clearance appt with PCP (has to be in person)  We received surgical clearance forms from [facility] regarding pt's knee replacement and she has to have an appt before PCP can fill out forms

## 2021-09-26 ENCOUNTER — Other Ambulatory Visit: Payer: Self-pay

## 2021-09-26 ENCOUNTER — Other Ambulatory Visit: Payer: Self-pay | Admitting: *Deleted

## 2021-09-26 ENCOUNTER — Other Ambulatory Visit (INDEPENDENT_AMBULATORY_CARE_PROVIDER_SITE_OTHER): Payer: Medicare Other

## 2021-09-26 DIAGNOSIS — R300 Dysuria: Secondary | ICD-10-CM

## 2021-09-26 LAB — POC URINALSYSI DIPSTICK (AUTOMATED)
Bilirubin, UA: NEGATIVE
Glucose, UA: NEGATIVE
Ketones, UA: NEGATIVE
Nitrite, UA: NEGATIVE
Protein, UA: NEGATIVE
Spec Grav, UA: <=1.005 — AB (ref 1.010–1.025)
Urobilinogen, UA: 0.2 E.U./dL
pH, UA: 5.5 (ref 5.0–8.0)

## 2021-09-26 MED ORDER — CEPHALEXIN 500 MG PO CAPS: 500.0000 mg | ORAL_CAPSULE | Freq: Two times a day (BID) | ORAL | 0 refills | Status: DC

## 2021-09-26 NOTE — Telephone Encounter (Signed)
Called pt and advised her of UA results and Dr. Marliss Coots comments. Rx sent to pharmacy

## 2021-09-26 NOTE — Telephone Encounter (Signed)
-----   Message from Abner Greenspan, MD sent at 09/26/2021  1:59 PM EDT ----- Wbc in urine with some blood cells Most likely uti Please send keflex 500 mg bid 7 d #14 no ref  Drink water Cx pending  Alert [place] if worse in meantime or any changes  ER if severe

## 2021-09-28 LAB — URINE CULTURE
MICRO NUMBER:: 12595888
SPECIMEN QUALITY:: ADEQUATE

## 2021-10-01 ENCOUNTER — Other Ambulatory Visit: Payer: Self-pay

## 2021-10-01 MED ORDER — DILTIAZEM HCL ER COATED BEADS 180 MG PO CP24: 180.0000 mg | ORAL_CAPSULE | Freq: Every day | ORAL | 3 refills | Status: DC

## 2021-10-02 ENCOUNTER — Other Ambulatory Visit: Payer: Self-pay

## 2021-10-02 ENCOUNTER — Ambulatory Visit (INDEPENDENT_AMBULATORY_CARE_PROVIDER_SITE_OTHER): Payer: Medicare Other | Admitting: Family Medicine

## 2021-10-02 ENCOUNTER — Encounter: Payer: Self-pay | Admitting: Family Medicine

## 2021-10-02 DIAGNOSIS — M2550 Pain in unspecified joint: Secondary | ICD-10-CM | POA: Diagnosis not present

## 2021-10-02 NOTE — Patient Instructions (Addendum)
Follow up with rheumatology as planned in 5 days  Be honest about pain level and your disability   If you feel depressed, there are some medicines that may help sleep and pain as well as depression    Question- auto immune joint disease vs osteoarthritis (wear and tear) or both  Discuss treatment options  Take good care of yourself  Keep moving the best you can  Try to stay hydrated an eat healthy

## 2021-10-02 NOTE — Assessment & Plan Note (Addendum)
Currently shoulder girdle/shoulders are the worst, having trouble raising arms past 90 degrees due to pain  No myofascial trigger points however  Pt has f/u with rheumatology Dr Benjamine Mola on the 15th and plans to discuss Has mri order pending from orthopedics for cervicalgia Notes and studies from both reviewed today She has had pos rheumatoid factor but no definitive dx of auto immune process (also no inc in esr to indicate PMR)  Also sees orthopedics for different joint conditions and OA Currently taking tylenol and occ tramadol (she dislikes) Complicated scenerio and also planning L knee surgery this winter Adv to f/u with rheum for further eval and continue conservative management  Brought up option of tricyclic if she develops more of a chronic pain scenario, but she would rather avoid

## 2021-10-02 NOTE — Progress Notes (Signed)
Subjective:    Patient ID: Tammy Manning, female    DOB: 1935-01-09, 86 y.o.   MRN: [id number]  This visit occurred during the SARS-CoV-2 public health emergency.  Safety protocols were in place, including screening questions prior to the visit, additional usage of staff PPE, and extensive cleaning of exam room while observing appropriate contact time as indicated for disinfecting solutions.   HPI Pt presents for shoulder pain   Wt Readings from Last 3 Encounters:  10/02/21 189 lb 6 oz (85.9 kg)  08/05/21 188 lb (85.3 kg)  07/18/21 189 lb 14.4 oz (86.1 kg)   31.51 kg/m  Shoulder problems have increased   She has had shots for it  Dr Glynis Smiles - ortho Cannot raise her arms  Looking at the back of her neck   Has MRI on the 18th  Pending   Both ankles hurt Both knees hurt   Pos rheumatoid factor  Sees rheumatology  Dr Benjamine Mola is rheumatology  Last visit did not lean towards RA as cause of pain because in January was improved with PT Now has worsened again however  She has knee surgery planned upcoming  She feels like her symptoms are too bad to go through that   Takes tylenol around the clock Has tramdol for emergencies    Lab Results  Component Value Date   ESRSEDRATE 24 02/06/2021   L knee swells  Her R thumb swells  No fever or rash     Feels bad all over  Is exhausted /hard to take another step Every nerve in body is on edge   Patient Active Problem List   Diagnosis Date Noted   Blood urine 06/12/2021   Vaginal bleeding 06/12/2021   Atrial fibrillation with RVR ([place]) 04/28/2021   Hypertrophic toenail 04/22/2021   Cough 03/24/2021   Pedal edema 03/24/2021   Leg pain, left 03/24/2021   Elevated rheumatoid factor 11/25/2020   Bilateral hand pain 11/25/2020   Pain in right shoulder 10/16/2020   Joint pain 10/16/2020   Joint swelling 10/16/2020   Estrogen deficiency 04/30/2020   Low back pain 03/15/2020   Dry mouth 03/15/2020   Osteoarthritis of  right hip 01/02/2020   Groin pain, right 01/01/2020   Trochanteric bursitis of right hip 11/09/2018   Transient global amnesia 05/12/2018   Right thyroid nodule 05/12/2018   History of TIA (transient ischemic attack) 05/09/2018   Aortic insufficiency 09/10/2015   Stress reaction 09/08/2015   Encounter for Medicare annual wellness exam 02/28/2013   Bucket-handle tear of lateral meniscus of left knee as current injury 12/30/2012   Episodic atrial fibrillation ([place]) 11/17/2012   Special screening for malignant neoplasms, colon 01/21/2012   Hearing loss of both ears 01/14/2012   HEADACHE, CHRONIC 09/24/2010   B12 deficiency 03/17/2010   DYSPEPSIA 03/17/2010   POSTMENOPAUSAL STATUS 08/20/2008   Essential hypertension 07/19/2007   Hyperlipidemia 06/24/2007   CONSTIPATION 06/24/2007   IBS 06/24/2007   DERMATITIS, ATOPIC 06/24/2007   SKIN CANCER, HX OF 06/24/2007   Past Medical History:  Diagnosis Date   Anginal pain ([place])    Aortic insufficiency    a. 02/2020 Echo: EF 60-65%, no rwma, Gr1 DD, nl RV fxn, mildly dil LA, triv AI, Asc Ao 80m.   Arthritis    Knees   B12 deficiency    Mild   Bucket-handle tear of lateral meniscus of left knee as current injury 12/30/2012   Cancer (HCC)    Skin, basal cell on nose and eyelid  DJD (degenerative joint disease)    Low back   Dyspepsia    GERD (gastroesophageal reflux disease)    Heart murmur    Heart palpitations    History of cardiac cath    a. 02/2017 Cath: Nl cors. Nl LV fxn. Abd Aogram w/o RAS.   HOH (hard of hearing)    Hyperlipidemia    Hypertension    Lactose intolerance    Mixed incontinence    PONV (postoperative nausea and vomiting)    Vulvar irritation    from urine pad, uses Triamcinolone oint.   Past Surgical History:  Procedure Laterality Date   ABDOMINAL AORTOGRAM N/A 03/15/2017   Procedure: Abdominal Aortogram;  Surgeon: Wellington Hampshire, MD;  Location: [facility];  Service: Cardiovascular;  Laterality:  N/A;   ABDOMINAL HYSTERECTOMY  1982   Large fibroids and bleeding   APPENDECTOMY     BILATERAL SALPINGOOPHORECTOMY  1986   BONE GRAFT HIP ILIAC CREST     To Left arm   BREAST REDUCTION SURGERY     BREAST SURGERY     CARDIAC CATHETERIZATION     CATARACT EXTRACTION W/PHACO Left 07/07/2016   Procedure: CATARACT EXTRACTION PHACO AND INTRAOCULAR LENS PLACEMENT (IOC);  Surgeon: Birder Robson, MD;  Location: [facility];  Service: Ophthalmology;  Laterality: Left;  [place] 01:10 AP% 18.3 CDE 12.91 Fluid pak lot # [id number]   CATARACT EXTRACTION W/PHACO Right 07/28/2016   Procedure: CATARACT EXTRACTION PHACO AND INTRAOCULAR LENS PLACEMENT (IOC);  Surgeon: Birder Robson, MD;  Location: [facility];  Service: Ophthalmology;  Laterality: Right;  [place] 01:26 AP% 18.5 CDE 16.12 Fluid pack lot # [id number]   COLONOSCOPY  07/2016   Dr. Thana Farr   DILATION AND CURETTAGE OF UTERUS     miscarragesx2   ESOPHAGOGASTRODUODENOSCOPY     Polyps   HEMORRHOID SURGERY  2006   KNEE ARTHROSCOPY WITH LATERAL MENISECTOMY Left 12/30/2012   Procedure: KNEE ARTHROSCOPY WITH LATERAL MENISECTOMY;  Surgeon: Johnny Bridge, MD;  Location: [facility];  Service: Orthopedics;  Laterality: Left;  LEFT KNEE SCOPE LATERAL MENISCECTOMY   LEFT HEART CATH AND CORONARY ANGIOGRAPHY N/A 03/15/2017   Procedure: Left Heart Cath and Coronary Angiography;  Surgeon: Wellington Hampshire, MD;  Location: [facility];  Service: Cardiovascular;  Laterality: N/A;   MOUTH SURGERY     skin cancer eyelid  2012   TONSILLECTOMY     Social History   Tobacco Use   Smoking status: Former    Packs/day: 1.00    Years: 12.00    Pack years: 12.00    Types: Cigarettes    Quit date: 11/24/1963    Years since quitting: 57.8   Smokeless tobacco: Never  Vaping Use   Vaping Use: Never used  Substance Use Topics   Alcohol use: No    Alcohol/week: 0.0 standard drinks   Drug use: No   Family History  Problem Relation Age of Onset   Cancer  Mother        Colon   Heart disease Father        CAD   Diabetes Sister    Hypothyroidism Sister    Diabetes Brother    Leukemia Brother    Esophageal cancer Brother    Cancer Paternal Aunt        Breast   Diabetes Sister    Alzheimer's disease Sister    Diabetes Brother    Breast cancer Daughter    Allergies  Allergen Reactions  Antihistamines, Chlorpheniramine-Type Other (See Comments)    Reaction:  Makes pt hyper    Atenolol Hypertension   Cefuroxime Axetil Other (See Comments)    Upset stomach   Ciprofloxacin Other (See Comments)    Upset stomach   Co Q 10 [Coenzyme Q10] Other (See Comments)    Reaction:  GI upset    Crestor [Rosuvastatin Calcium] Other (See Comments)    Reaction:  Myalgias    Dextromethorphan-Guaifenesin Other (See Comments)    Reaction:  Made pt jittery    Epinephrine Hives   Ramipril Other (See Comments)    Upset stomach   Sulfamethoxazole-Trimethoprim Other (See Comments)    Upset stomach   Vitamin D Analogs Other (See Comments)    Reaction:  GI upset    Current Outpatient Medications on File Prior to Visit  Medication Sig Dispense Refill   acetaminophen (TYLENOL) 500 MG tablet Take 500 mg by mouth every 6 (six) hours as needed.     apixaban (ELIQUIS) 5 MG TABS tablet Take 1 tablet (5 mg total) by mouth 2 (two) times daily. 180 tablet 1   atorvastatin (LIPITOR) 10 MG tablet TAKE 1 TABLET BY MOUTH EVERY DAY AT 6 PM 90 tablet 0   Calcium Carbonate-Vitamin D (CALCIUM 600+D PO) Take 1 tablet by mouth daily.      cephALEXin (KEFLEX) 500 MG capsule Take 1 capsule (500 mg total) by mouth 2 (two) times daily. 14 capsule 0   clobetasol ointment (TEMOVATE) 2.13 % Apply 1 application topically 2 (two) times daily as needed.     clorazepate (TRANXENE) 7.5 MG tablet Take 1 tablet (7.5 mg total) by mouth daily as needed. 30 tablet 1   cyanocobalamin (,VITAMIN B-12,) 1000 MCG/ML injection Inject 1,000 mcg into the muscle every 30 (thirty) days.       diltiazem (CARDIZEM CD) 180 MG 24 hr capsule Take 1 capsule (180 mg total) by mouth daily. 90 capsule 3   irbesartan (AVAPRO) 300 MG tablet TAKE 1 TABLET BY MOUTH DAILY 90 tablet 3   MAGNESIUM LACTATE PO Take by mouth 2 (two) times daily.     nystatin (MYCOSTATIN) 100000 UNIT/ML suspension Swish and swallow 5 mL three times daily 60 mL 0   omeprazole (PRILOSEC) 20 MG capsule Take 1 capsule (20 mg total) by mouth 2 (two) times daily as needed. 180 capsule 1   triamcinolone (NASACORT) 55 MCG/ACT AERO nasal inhaler Place 2 sprays into the nose daily. 1 Inhaler 12   traMADol (ULTRAM) 50 MG tablet Take 50 mg by mouth 3 (three) times daily as needed.     No current facility-administered medications on file prior to visit.     Review of Systems  Constitutional:  Positive for fatigue. Negative for activity change, appetite change, fever and unexpected weight change.  HENT:  Negative for congestion, ear pain, rhinorrhea, sinus pressure and sore throat.   Eyes:  Negative for pain, redness and visual disturbance.  Respiratory:  Negative for cough, shortness of breath and wheezing.   Cardiovascular:  Negative for chest pain and palpitations.  Gastrointestinal:  Negative for abdominal pain, blood in stool, constipation and diarrhea.  Endocrine: Negative for polydipsia and polyuria.  Genitourinary:  Negative for dysuria, frequency and urgency.  Musculoskeletal:  Positive for arthralgias and neck pain. Negative for back pain, joint swelling and myalgias.  Skin:  Negative for pallor and rash.  Allergic/Immunologic: Negative for environmental allergies.  Neurological:  Negative for dizziness, syncope and headaches.  Hematological:  Negative for adenopathy. Does  not bruise/bleed easily.  Psychiatric/Behavioral:  Negative for decreased concentration and dysphoric mood. The patient is not nervous/anxious.       Objective:   Physical Exam Constitutional:      General: She is not in acute distress.     Appearance: Normal appearance. She is obese. She is not ill-appearing.  Eyes:     General: No scleral icterus.    Conjunctiva/sclera: Conjunctivae normal.     Pupils: Pupils are equal, round, and reactive to light.  Cardiovascular:     Rate and Rhythm: Normal rate and regular rhythm.  Pulmonary:     Effort: Pulmonary effort is normal. No respiratory distress.     Breath sounds: Normal breath sounds.  Musculoskeletal:     Comments: No acute joint swelling or warmth/erythema  Some tenderness of CMC joints of bilateral thumbs  Shoulder rom of limited due to pain  Limited to abduction of 90 deg or less No crepitus  L knee is swollen/ this is baseline recently with an effusion    Lymphadenopathy:     Cervical: No cervical adenopathy.  Skin:    Coloration: Skin is not jaundiced or pale.     Findings: No bruising, erythema or rash.  Neurological:     Mental Status: She is alert.     Cranial Nerves: No cranial nerve deficit.  Psychiatric:        Mood and Affect: Mood is anxious and depressed.        Cognition and Memory: Cognition and memory normal.     Comments: Seems mildly distressed and frustrated (but also in pain)  Pleasant  Not quite tearful  Voices significant pain and disability          Assessment & Plan:   Problem List Items Addressed This Visit       Other   Joint pain    Currently shoulder girdle/shoulders are the worst, having trouble raising arms past 90 degrees due to pain  No myofascial trigger points however  Pt has f/u with rheumatology Dr Benjamine Mola on the 15th and plans to discuss Has mri order pending from orthopedics for cervicalgia Notes and studies from both reviewed today She has had pos rheumatoid factor but no definitive dx of auto immune process (also no inc in esr to indicate PMR)  Also sees orthopedics for different joint conditions and OA Currently taking tylenol and occ tramadol (she dislikes) Complicated scenerio and also planning L knee  surgery this winter Adv to f/u with rheum for further eval and continue conservative management  Brought up option of tricyclic if she develops more of a chronic pain scenario, but she would rather avoid

## 2021-10-03 ENCOUNTER — Telehealth: Payer: Self-pay | Admitting: Family Medicine

## 2021-10-03 NOTE — Chronic Care Management (AMB) (Signed)
  Chronic Care Management   Note  10/03/2021 Name: [name] MRN: [id number] DOB: 12-28-1934  Tammy Manning is a 86 y.o. year old female who is a primary care patient of [name], [name], MD. I reached out to [employer] by phone today in response to a referral sent by [name] PCP, [name], [name], MD.   [name] was given information about Chronic Care Management services today including:  CCM service includes personalized support from designated clinical staff supervised by her physician, including individualized plan of care and coordination with other care providers 24/7 contact phone numbers for assistance for urgent and routine care needs. Service will only be billed when office clinical staff spend 20 minutes or more in a month to coordinate care. Only one practitioner may furnish and bill the service in a calendar month. The patient may stop CCM services at any time (effective at the end of the month) by phone call to the office staff.   Patient agreed to services and verbal consent obtained.   Follow up plan:   [place] Secretary/administrator

## 2021-10-06 NOTE — Progress Notes (Signed)
Office Visit Note  Patient: Tammy Manning             Date of Birth: 1935-05-12           MRN: [id number]             PCP: Abner Greenspan, MD Referring: [name], [name], MD Visit Date: 10/07/2021   Subjective:  Follow-up (Total body pain, right hand 1st digit pain and swelling)   History of Present Illness: Tammy Manning is a 86 y.o. female here for follow up with chronic joint pains no definite inflammatory changes at initial visits but highly positive CCP Abs and osteoarthritis of multiple joints. She has experienced some increased joint pain and mobility decrease. Pain is greatly increased especially in shoulders, base of thumbs, knees, and ankles. There is some swelling at the thumbs more than elsewhere. She is scheduled for left knee arthroplasty early next year.  Previous HPI 12/23/20 Tammy Manning is a 86 y.o. female with a history of afib and TIA here for follow up of joint pains and positive rheumatoid factor, initial visit no active joint inflammation was seen. Hand xrays were obtained with no erosive disease noted, osteoarthritis changes were present. Since her last visit she notices symptoms are worse with pain in the right shoulder and left knee she has started PT for the shoulder 3 sessions so far thinks this is starting to improve her ROM although still very stiff. Previous injections helped about 3-4 weeks. The left knee is more swollen and stiff she is following up for orthopedics with this upcoming appointment 12/25/20. No other peripheral joint swelling. Morning stiffness lasting up to 1 hour duration worst in the knee.   11/25/20 Tammy Manning is a 86 y.o. female with a history of afib, TIA, and OA here for evaluation of joint pain and positive rheumatoid factor. She has joint pain in multiple sites including both hands but also has a lot of pain with the right shoulder that started more acutely with suspected rotator cuff tear. The shoulder and hand pain are  problematic and impede painting which is a regular activity for her. She does notice swelling in hands intermittently. She has morning stiffness lasting less than 30 minutes daily also persistent pain throughout the day. She has had left radius fracture with surgical repair but no other major surgery of her arms.     Labs reviewed 09/2020 RF 26 ESR wnl ANA neg   Imaging reviewed 09/2020 Xray right shoulder Mild AC joint OA, glenohumeral joint near normal, cystic change in tuberosity   Review of Systems  Constitutional:  Positive for fatigue.  HENT:  Positive for mouth dryness.   Eyes:  Positive for dryness.  Respiratory:  Negative for shortness of breath.   Cardiovascular:  Negative for swelling in legs/feet.  Gastrointestinal:  Negative for constipation.  Endocrine: Positive for cold intolerance, excessive thirst and increased urination.  Genitourinary:  Positive for decreased urine output and painful urination.  Musculoskeletal:  Positive for joint pain, gait problem, joint pain, joint swelling, muscle weakness, morning stiffness and muscle tenderness.  Skin:  Negative for rash.  Allergic/Immunologic: Positive for susceptible to infections.  Neurological:  Positive for tremors, numbness and weakness.  Hematological:  Negative for bruising/bleeding tendency.  Psychiatric/Behavioral:  Positive for sleep disturbance.    PMFS History:  Patient Active Problem List   Diagnosis Date Noted   High risk medication use 10/07/2021   Blood urine 06/12/2021   Vaginal bleeding 06/12/2021   Atrial  fibrillation with RVR ([place]) 04/28/2021   Hypertrophic toenail 04/22/2021   Cough 03/24/2021   Pedal edema 03/24/2021   Leg pain, left 03/24/2021   Elevated rheumatoid factor 11/25/2020   Bilateral hand pain 11/25/2020   Bilateral shoulder pain 10/16/2020   Joint pain 10/16/2020   Joint swelling 10/16/2020   Estrogen deficiency 04/30/2020   Low back pain 03/15/2020   Dry mouth 03/15/2020    Osteoarthritis of right hip 01/02/2020   Groin pain, right 01/01/2020   Trochanteric bursitis of right hip 11/09/2018   Transient global amnesia 05/12/2018   Right thyroid nodule 05/12/2018   History of TIA (transient ischemic attack) 05/09/2018   Aortic insufficiency 09/10/2015   Stress reaction 09/08/2015   Encounter for Medicare annual wellness exam 02/28/2013   Bucket-handle tear of lateral meniscus of left knee as current injury 12/30/2012   Episodic atrial fibrillation ([place]) 11/17/2012   Special screening for malignant neoplasms, colon 01/21/2012   Hearing loss of both ears 01/14/2012   HEADACHE, CHRONIC 09/24/2010   B12 deficiency 03/17/2010   DYSPEPSIA 03/17/2010   POSTMENOPAUSAL STATUS 08/20/2008   Essential hypertension 07/19/2007   Hyperlipidemia 06/24/2007   CONSTIPATION 06/24/2007   IBS 06/24/2007   DERMATITIS, ATOPIC 06/24/2007   SKIN CANCER, HX OF 06/24/2007    Past Medical History:  Diagnosis Date   Anginal pain ([place])    Aortic insufficiency    a. 02/2020 Echo: EF 60-65%, no rwma, Gr1 DD, nl RV fxn, mildly dil LA, triv AI, Asc Ao 65m.   Arthritis    Knees   B12 deficiency    Mild   Bucket-handle tear of lateral meniscus of left knee as current injury 12/30/2012   Cancer ([place]    Skin, basal cell on nose and eyelid   DJD (degenerative joint disease)    Low back   Dyspepsia    GERD (gastroesophageal reflux disease)    Heart murmur    Heart palpitations    History of cardiac cath    a. 02/2017 Cath: Nl cors. Nl LV fxn. Abd Aogram w/o RAS.   HOH (hard of hearing)    Hyperlipidemia    Hypertension    Lactose intolerance    Mixed incontinence    PONV (postoperative nausea and vomiting)    Vulvar irritation    from urine pad, uses Triamcinolone oint.    Family History  Problem Relation Age of Onset   Cancer Mother        Colon   Heart disease Father        CAD   Diabetes Sister    Hypothyroidism Sister    Diabetes Brother    Leukemia Brother     Esophageal cancer Brother    Cancer Paternal Aunt        Breast   Diabetes Sister    Alzheimer's disease Sister    Diabetes Brother    Breast cancer Daughter    Past Surgical History:  Procedure Laterality Date   ABDOMINAL AORTOGRAM N/A 03/15/2017   Procedure: Abdominal Aortogram;  Surgeon: MWellington Hampshire MD;  [facility];  Service: Cardiovascular;  Laterality: N/A;   ABDOMINAL HYSTERECTOMY  1982   Large fibroids and bleeding   APPENDECTOMY     BILATERAL SALPINGOOPHORECTOMY  1986   BONE GRAFT HIP ILIAC CREST     To Left arm   BREAST REDUCTION SURGERY     BREAST SURGERY     CARDIAC CATHETERIZATION     CATARACT EXTRACTION W/PHACO Left 07/07/2016  Procedure: CATARACT EXTRACTION PHACO AND INTRAOCULAR LENS PLACEMENT (IOC);  Surgeon: Birder Robson, MD;  Location: [facility];  Service: Ophthalmology;  Laterality: Left;  [place] 01:10 AP% 18.3 CDE 12.91 Fluid pak lot # [id number]   CATARACT EXTRACTION W/PHACO Right 07/28/2016   Procedure: CATARACT EXTRACTION PHACO AND INTRAOCULAR LENS PLACEMENT (IOC);  Surgeon: Birder Robson, MD;  Location: [facility];  Service: Ophthalmology;  Laterality: Right;  [place] 01:26 AP% 18.5 CDE 16.12 Fluid pack lot # [id number]   COLONOSCOPY  07/2016   Dr. Thana Farr   DILATION AND CURETTAGE OF UTERUS     miscarragesx2   ESOPHAGOGASTRODUODENOSCOPY     Polyps   HEMORRHOID SURGERY  2006   KNEE ARTHROSCOPY WITH LATERAL MENISECTOMY Left 12/30/2012   Procedure: KNEE ARTHROSCOPY WITH LATERAL MENISECTOMY;  Surgeon: Johnny Bridge, MD;  Location: [place];  Service: Orthopedics;  Laterality: Left;  LEFT KNEE SCOPE LATERAL MENISCECTOMY   LEFT HEART CATH AND CORONARY ANGIOGRAPHY N/A 03/15/2017   Procedure: Left Heart Cath and Coronary Angiography;  Surgeon: Wellington Hampshire, MD;  Location: [facility];  Service: Cardiovascular;  Laterality: N/A;   MOUTH SURGERY     skin cancer eyelid  2012   TONSILLECTOMY     Social History    Social History Narrative   Metallurgist.     Immunization History  Administered Date(s) Administered   Fluad Quad(high Dose 65+) 10/16/2020   Influenza Split 07/24/2011, 08/23/2014   Influenza Whole 08/23/2004, 07/24/2009, 08/06/2010, 08/05/2012   Influenza, High Dose Seasonal PF 08/11/2016, 08/27/2017, 08/02/2018, 09/05/2021   Influenza,inj,Quad PF,6+ Mos 08/16/2013, 08/07/2015   Influenza-Unspecified 09/05/2021   PFIZER(Purple Top)SARS-COV-2 Vaccination 12/15/2019, 01/05/2020, 10/25/2020   Pneumococcal Conjugate-13 08/24/2014   Pneumococcal Polysaccharide-23 08/20/2008   Td 03/20/2003, 02/28/2013   Zoster, Live 09/04/2015     Objective: Vital Signs: BP (!) 151/63 (BP Location: Left Arm, Patient Position: Sitting, Cuff Size: Normal)   Pulse 87   Resp 16   Ht 5' 4.5" (1.638 m)   Wt 190 lb (86.2 kg)   BMI 32.11 kg/m    Physical Exam Constitutional:      Appearance: She is obese.  Eyes:     Conjunctiva/sclera: Conjunctivae normal.  Musculoskeletal:     Right lower leg: No edema.     Left lower leg: No edema.  Skin:    General: Skin is warm and dry.     Findings: No rash.  Neurological:     General: No focal deficit present.     Mental Status: She is alert.  Psychiatric:     Comments: Tearful from pain with movement for shoulder exam     Musculoskeletal Exam:  Neck full ROM no tenderness Shoulders abduction severely limited with pain bilaterally, normal internal and external rotation, tolerates passive movement through most of ROM Elbows full ROM no tenderness or swelling Wrists full ROM no tenderness or swelling Fingers full ROM mild swelling at right 1st [facility] tenderness at [facility] bilaterally, no focal tenderness or synovitis in other digits Knees decreased extension b/l, small effusion present worse on left Ankles full ROM no tenderness or swelling   Investigation: No additional findings.  Imaging: No results found.  Recent Labs: Lab Results  Component  Value Date   WBC 8.9 06/08/2021   HGB 12.2 06/08/2021   PLT 354 06/08/2021   NA 136 06/08/2021   K 4.0 06/08/2021   CL 104 06/08/2021   CO2 23 06/08/2021   GLUCOSE 92 06/08/2021   BUN 14 06/08/2021  CREATININE 0.81 06/08/2021   BILITOT 0.9 06/08/2021   ALKPHOS 58 06/08/2021   AST 22 06/08/2021   ALT 16 06/08/2021   PROT 6.9 06/08/2021   ALBUMIN 3.9 06/08/2021   CALCIUM 9.3 06/08/2021   GFRAA 71 02/02/2020    Speciality Comments: No specialty comments available.  Procedures:  No procedures performed Allergies: Antihistamines, chlorpheniramine-type; Atenolol; Cefuroxime axetil; Ciprofloxacin; Co q 10 [coenzyme q10]; Crestor [rosuvastatin calcium]; Dextromethorphan-guaifenesin; Epinephrine; Ramipril; Sulfamethoxazole-trimethoprim; and Vitamin d analogs   Assessment / Plan:     Visit Diagnoses: Chronic pain of both shoulders - Plan: Sedimentation rate, C-reactive protein, predniSONE (DELTASONE) 10 MG tablet Elevated rheumatoid factor - Plan: predniSONE (DELTASONE) 10 MG tablet  Symptoms are much worse today compared to our last visit including small increases in swelling at the thumbs and knees. The bilateral almost symmetric shoulder pain suspicious for inflammation. Plan for initial prednisone taper for the next 2 weeks from 30 mg down to 10 mg daily. Will recheck ESR and CRP today during increased symptoms. Plan to try starting on oral methotrexate since there are some possible inflammatory changes on exam now compared to before. Provided information to review and getting baseline labs plan to review for starting at follow up.  Bilateral hand pain  Primarily at the base of thumbs bilaterally mostly on right. This is also site of more advanced osteoarthritis compared to other fingers, could also be contributed from increased use walking with cane to offload knees.  Leg pain, left  Knee effusion slightly worse on left, probably due to worse underlying chronic degenerative  disease.  Arthralgia, unspecified joint  High risk medication use - Plan: Hepatitis B core antibody, IgM, Hepatitis B surface antigen, Hepatitis C antibody, CBC with Differential/Platelet, COMPLETE METABOLIC PANEL WITH GFR  Symptoms now more suspicious for rheumatoid arthritis although likely OA largely contributing still. Anticipating trial of methotrexate treatment will check baseline labs for monitoring including hepatitis screening, CBC, and CMP  Orders: Orders Placed This Encounter  Procedures   Hepatitis B core antibody, IgM   Hepatitis B surface antigen   Hepatitis C antibody   CBC with Differential/Platelet   COMPLETE METABOLIC PANEL WITH GFR   Sedimentation rate   C-reactive protein    Meds ordered this encounter  Medications   predniSONE (DELTASONE) 10 MG tablet    Sig: Take 3 tablets (30 mg total) by mouth daily with breakfast for 5 days, THEN 2 tablets (20 mg total) daily with breakfast for 5 days, THEN 1 tablet (10 mg total) daily with breakfast for 10 days.    Dispense:  35 tablet    Refill:  0      Follow-Up Instructions: Return in about 2 weeks (around 10/21/2021) for ?RA/OA GC taper f/u 2 wks.   Collier Salina, MD  Note - This record has been created using [employer].  Chart creation errors have been sought, but may not always  have been located. Such creation errors do not reflect on  the standard of medical care.

## 2021-10-07 ENCOUNTER — Ambulatory Visit (INDEPENDENT_AMBULATORY_CARE_PROVIDER_SITE_OTHER): Payer: Medicare Other | Admitting: Internal Medicine

## 2021-10-07 ENCOUNTER — Other Ambulatory Visit: Payer: Self-pay

## 2021-10-07 ENCOUNTER — Encounter: Payer: Self-pay | Admitting: Internal Medicine

## 2021-10-07 VITALS — BP 151/63 | HR 87 | Resp 16 | Ht 64.5 in | Wt 190.0 lb

## 2021-10-07 DIAGNOSIS — M79642 Pain in left hand: Secondary | ICD-10-CM | POA: Diagnosis not present

## 2021-10-07 DIAGNOSIS — M79641 Pain in right hand: Secondary | ICD-10-CM | POA: Diagnosis not present

## 2021-10-07 DIAGNOSIS — M25511 Pain in right shoulder: Secondary | ICD-10-CM

## 2021-10-07 DIAGNOSIS — R768 Other specified abnormal immunological findings in serum: Secondary | ICD-10-CM | POA: Diagnosis not present

## 2021-10-07 DIAGNOSIS — M25512 Pain in left shoulder: Secondary | ICD-10-CM | POA: Diagnosis not present

## 2021-10-07 DIAGNOSIS — Z79899 Other long term (current) drug therapy: Secondary | ICD-10-CM

## 2021-10-07 DIAGNOSIS — M2550 Pain in unspecified joint: Secondary | ICD-10-CM

## 2021-10-07 DIAGNOSIS — M79605 Pain in left leg: Secondary | ICD-10-CM | POA: Diagnosis not present

## 2021-10-07 DIAGNOSIS — G8929 Other chronic pain: Secondary | ICD-10-CM | POA: Diagnosis not present

## 2021-10-07 MED ORDER — PREDNISONE 10 MG PO TABS: ORAL_TABLET | ORAL | 0 refills | Status: DC

## 2021-10-07 NOTE — Patient Instructions (Addendum)
I recommend trying to take prednisone at a moderately high dose for a few days and taper down to improve your current symptoms. Can take 30 mg prednisone and decrease by 10 mg per day each 5 days.  I would consider starting treatment with methotrexate for rheumatoid arthritis based on current symptoms. We are checking baseline labs for medication safety monitoring and will discuss this at our follow up in about 2 weeks.  Methotrexate Tablets What is this medication? METHOTREXATE (METH oh TREX ate) treats inflammatory conditions such as arthritis and psoriasis. It works by decreasing inflammation, which can reduce pain and prevent long-term injury to the joints and skin. It may also be used to treat some types of cancer. It works by slowing down the growth of cancer cells. This medicine may be used for other purposes; ask your health care provider or pharmacist if you have questions. COMMON BRAND NAME(S): Rheumatrex, Trexall What should I tell my care team before I take this medication? They need to know if you have any of these conditions: Fluid in the stomach area or lungs If you often drink alcohol Infection or immune system problems Kidney disease or on hemodialysis Liver disease Low blood counts, like low white cell, platelet, or red cell counts Lung disease Radiation therapy Stomach ulcers Ulcerative colitis An unusual or allergic reaction to methotrexate, other medications, foods, dyes, or preservatives Pregnant or trying to get pregnant Breast-feeding How should I use this medication? Take this medication by mouth with a glass of water. Follow the directions on the prescription label. Take your medication at regular intervals. Do not take it more often than directed. Do not stop taking except on your care team's advice. Make sure you know why you are taking this medication and how often you should take it. If this medication is used for a condition that is not cancer, like arthritis  or psoriasis, it should be taken weekly, NOT daily. Taking this medication more often than directed can cause serious side effects, even death. Talk to your care team about safe handling and disposal of this medication. You may need to take special precautions. Talk to your care team about the use of this medication in children. While this medication may be prescribed for selected conditions, precautions do apply. Overdosage: If you think you have taken too much of this medicine contact a poison control center or emergency room at once. NOTE: This medicine is only for you. Do not share this medicine with others. What if I miss a dose? If you miss a dose, talk with your care team. Do not take double or extra doses. What may interact with this medication? Do not take this medication with any of the following: Acitretin This medication may also interact with the following: Aspirin and aspirin-like medications including salicylates Azathioprine Certain antibiotics like penicillins, tetracycline, and chloramphenicol Certain medications that treat or prevent blood clots like warfarin, apixaban, dabigatran, and rivaroxaban Certain medications for stomach problems like esomeprazole, omeprazole, pantoprazole Cyclosporine Dapsone Diuretics Gold Hydroxychloroquine Live virus vaccines Medications for infection like acyclovir, adefovir, amphotericin B, bacitracin, cidofovir, foscarnet, ganciclovir, gentamicin, pentamidine, vancomycin Mercaptopurine NSAIDs, medications for pain and inflammation, like ibuprofen or naproxen Other cytotoxic agents Pamidronate Pemetrexed Penicillamine Phenylbutazone Phenytoin Probenecid Pyrimethamine Retinoids such as isotretinoin and tretinoin Steroid medications like prednisone or cortisone Sulfonamides like sulfasalazine and trimethoprim/sulfamethoxazole Theophylline Zoledronic acid This list may not describe all possible interactions. Give your health care  provider a list of all the medicines, herbs, non-prescription drugs, or  dietary supplements you use. Also tell them if you smoke, drink alcohol, or use illegal drugs. Some items may interact with your medicine. What should I watch for while using this medication? Avoid alcoholic drinks. This medication can make you more sensitive to the sun. Keep out of the sun. If you cannot avoid being in the sun, wear protective clothing and use sunscreen. Do not use sun lamps or tanning beds/booths. You may need blood work done while you are taking this medication. Call your care team for advice if you get a fever, chills or sore throat, or other symptoms of a cold or flu. Do not treat yourself. This medication decreases your body's ability to fight infections. Try to avoid being around people who are sick. This medication may increase your risk to bruise or bleed. Call your care team if you notice any unusual bleeding. Be careful brushing or flossing your teeth or using a toothpick because you may get an infection or bleed more easily. If you have any dental work done, tell your dentist you are receiving this medication. Check with your care team if you get an attack of severe diarrhea, nausea and vomiting, or if you sweat a lot. The loss of too much body fluid can make it dangerous for you to take this medication. Talk to your care team about your risk of cancer. You may be more at risk for certain types of cancers if you take this medication. Do not become pregnant while taking this medication or for 6 months after stopping it. Women should inform their care team if they wish to become pregnant or think they might be pregnant. Men should not father a child while taking this medication and for 3 months after stopping it. There is potential for serious harm to an unborn child. Talk to your care team for more information. Do not breast-feed an infant while taking this medication or for 1 week after stopping it. This  medication may make it more difficult to get pregnant or father a child. Talk to your care team if you are concerned about your fertility. What side effects may I notice from receiving this medication? Side effects that you should report to your care team as soon as possible: Allergic reactions--skin rash, itching, hives, swelling of the face, lips, tongue, or throat Blood clot--pain, swelling, or warmth in the leg, shortness of breath, chest pain Dry cough, shortness of breath or trouble breathing Infection--fever, chills, cough, sore throat, wounds that don't heal, pain or trouble when passing urine, general feeling of discomfort or being unwell Kidney injury--decrease in the amount of urine, swelling of the ankles, hands, or feet Liver injury--right upper belly pain, loss of appetite, nausea, light-colored stool, dark yellow or brown urine, yellowing of the skin or eyes, unusual weakness or fatigue Low red blood cell count--unusual weakness or fatigue, dizziness, headache, trouble breathing Redness, blistering, peeling, or loosening of the skin, including inside the mouth Seizures Unusual bruising or bleeding Side effects that usually do not require medical attention (report to your care team if they continue or are bothersome): Diarrhea Dizziness Hair loss Nausea Pain, redness, or swelling with sores inside the mouth or throat Vomiting This list may not describe all possible side effects. Call your doctor for medical advice about side effects. You may report side effects to FDA at 1-800-FDA-1088. Where should I keep my medication? Keep out of the reach of children and pets. Store at room temperature between 20 and 25 degrees C (68  and 77 degrees F). Protect from light. Get rid of any unused medication after the expiration date.

## 2021-10-08 ENCOUNTER — Other Ambulatory Visit: Payer: Self-pay | Admitting: Family Medicine

## 2021-10-08 LAB — CBC WITH DIFFERENTIAL/PLATELET
Absolute Monocytes: 958 cells/uL — ABNORMAL HIGH (ref 200–950)
Basophils Absolute: 41 cells/uL (ref 0–200)
Basophils Relative: 0.4 %
Eosinophils Absolute: 72 cells/uL (ref 15–500)
Eosinophils Relative: 0.7 %
HCT: 37 % (ref 35.0–45.0)
Hemoglobin: 12.1 g/dL (ref 11.7–15.5)
Lymphs Abs: 1967 cells/uL (ref 850–3900)
MCH: 30.6 pg (ref 27.0–33.0)
MCHC: 32.7 g/dL (ref 32.0–36.0)
MCV: 93.4 fL (ref 80.0–100.0)
MPV: 9 fL (ref 7.5–12.5)
Monocytes Relative: 9.3 %
Neutro Abs: 7262 cells/uL (ref 1500–7800)
Neutrophils Relative %: 70.5 %
Platelets: 467 10*3/uL — ABNORMAL HIGH (ref 140–400)
RBC: 3.96 10*6/uL (ref 3.80–5.10)
RDW: 14.3 % (ref 11.0–15.0)
Total Lymphocyte: 19.1 %
WBC: 10.3 10*3/uL (ref 3.8–10.8)

## 2021-10-08 LAB — HEPATITIS B CORE ANTIBODY, IGM: Hep B C IgM: NONREACTIVE

## 2021-10-08 LAB — COMPLETE METABOLIC PANEL WITH GFR
AG Ratio: 1.4 (calc) (ref 1.0–2.5)
ALT: 7 U/L (ref 6–29)
AST: 12 U/L (ref 10–35)
Albumin: 3.9 g/dL (ref 3.6–5.1)
Alkaline phosphatase (APISO): 54 U/L (ref 37–153)
BUN: 20 mg/dL (ref 7–25)
CO2: 27 mmol/L (ref 20–32)
Calcium: 9.7 mg/dL (ref 8.6–10.4)
Chloride: 100 mmol/L (ref 98–110)
Creat: 0.75 mg/dL (ref 0.60–0.95)
Globulin: 2.7 g/dL (calc) (ref 1.9–3.7)
Glucose, Bld: 86 mg/dL (ref 65–99)
Potassium: 4.4 mmol/L (ref 3.5–5.3)
Sodium: 137 mmol/L (ref 135–146)
Total Bilirubin: 0.5 mg/dL (ref 0.2–1.2)
Total Protein: 6.6 g/dL (ref 6.1–8.1)
eGFR: 78 mL/min/{1.73_m2} (ref >=60)

## 2021-10-08 LAB — C-REACTIVE PROTEIN: CRP: 43.5 mg/L — ABNORMAL HIGH (ref <8.0)

## 2021-10-08 LAB — SEDIMENTATION RATE: Sed Rate: 58 mm/h — ABNORMAL HIGH (ref 0–30)

## 2021-10-08 LAB — HEPATITIS C ANTIBODY
Hepatitis C Ab: NONREACTIVE
SIGNAL TO CUT-OFF: 0.03 (ref <1.00)

## 2021-10-08 LAB — HEPATITIS B SURFACE ANTIGEN: Hepatitis B Surface Ag: NONREACTIVE

## 2021-10-10 ENCOUNTER — Encounter: Payer: Self-pay | Admitting: Family Medicine

## 2021-10-10 DIAGNOSIS — M542 Cervicalgia: Secondary | ICD-10-CM | POA: Diagnosis not present

## 2021-10-10 MED ORDER — AMITRIPTYLINE HCL 10 MG PO TABS: ORAL_TABLET | ORAL | 1 refills | Status: DC

## 2021-10-14 ENCOUNTER — Other Ambulatory Visit: Payer: Self-pay

## 2021-10-14 ENCOUNTER — Ambulatory Visit (INDEPENDENT_AMBULATORY_CARE_PROVIDER_SITE_OTHER): Payer: Medicare Other

## 2021-10-14 DIAGNOSIS — E538 Deficiency of other specified B group vitamins: Secondary | ICD-10-CM

## 2021-10-14 MED ORDER — CYANOCOBALAMIN 1000 MCG/ML IJ SOLN
1000.0000 ug | Freq: Once | INTRAMUSCULAR | Status: AC
  Administered 2021-10-14: 1000 ug via INTRAMUSCULAR

## 2021-10-14 NOTE — Progress Notes (Signed)
Per orders of Dr. Gordan Payment injection of B12 given by Loreen Freud. Patient tolerated injection well.

## 2021-10-19 ENCOUNTER — Encounter: Payer: Self-pay | Admitting: Family Medicine

## 2021-10-20 MED ORDER — CLORAZEPATE DIPOTASSIUM 7.5 MG PO TABS: 7.5000 mg | ORAL_TABLET | Freq: Every day | ORAL | 1 refills | Status: DC | PRN

## 2021-10-20 NOTE — Telephone Encounter (Signed)
Name of Medication: Clorazepate Name of Pharmacy: [facility] or Written Date and Quantity:06/26/21 #30 / 1 refills  Last Office Visit and Type: Shoulder Pain 10/02/21 Next Office Visit and Type: Surgical Clearance 10/30/21

## 2021-10-21 ENCOUNTER — Encounter: Payer: Medicare Other | Admitting: Obstetrics and Gynecology

## 2021-10-21 NOTE — Progress Notes (Signed)
Office Visit Note  Patient: Tammy Manning             Date of Birth: [birth year]           MRN: [id number]             PCP: Abner Greenspan, MD Referring: [name], [name], MD Visit Date: 10/22/2021   Subjective:  Follow-up (Improving)   History of Present Illness: Tammy Manning is a 86 y.o. female here for follow up for seropositive rheumatoid arthritis after last visit had severely worse joint pain and swelling started prednisone taper down to 10 mg daily. Labs showed highly positive inflammatory markers baseline labs for medication monitoring okay. She has had very large improvement on prednisone beginning about 1 day from start. During the interval she had MRI of the cervical spine showing some multilevel degenerative appearing changes for which she has orthopedics follow up later today.   Previous HPI 10/07/21 Tammy Manning is a 86 y.o. female here for follow up with chronic joint pains no definite inflammatory changes at initial visits but highly positive CCP Abs and osteoarthritis of multiple joints. She has experienced some increased joint pain and mobility decrease. Pain is greatly increased especially in shoulders, base of thumbs, knees, and ankles. There is some swelling at the thumbs more than elsewhere. She is scheduled for left knee arthroplasty early next year.   Previous HPI 11/25/20 Tammy Manning is a 86 y.o. female with a history of afib, TIA, and OA here for evaluation of joint pain and positive rheumatoid factor. She has joint pain in multiple sites including both hands but also has a lot of pain with the right shoulder that started more acutely with suspected rotator cuff tear. 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 She does notice swelling in hands intermittently. She has morning stiffness lasting less than 30 minutes daily also persistent pain throughout the day. She has had left radius fracture with  surgical repair but no other major surgery of her arms.     Labs reviewed 09/2020 RF 26 ESR wnl ANA neg   Imaging reviewed 09/2020 Xray right shoulder Mild AC joint OA, glenohumeral joint near normal, cystic change in tuberosity     Review of Systems  Constitutional:  Negative for fatigue.  HENT:  Positive for mouth dryness.   Eyes:  Positive for dryness.  Respiratory:  Negative for shortness of breath.   Cardiovascular:  Negative for swelling in legs/feet.  Gastrointestinal:  Positive for constipation and diarrhea.  Endocrine: Positive for cold intolerance, excessive thirst and increased urination.  Genitourinary:  Negative for difficulty urinating.  Musculoskeletal:  Positive for morning stiffness.  Skin:  Negative for rash.  Allergic/Immunologic: Positive for susceptible to infections.  Neurological:  Negative for weakness.  Hematological:  Positive for bruising/bleeding tendency.  Psychiatric/Behavioral:  Negative for sleep disturbance.    PMFS History:  Patient Active Problem List   Diagnosis Date Noted   High risk medication use 10/07/2021   Blood urine 06/12/2021   Vaginal bleeding 06/12/2021   Atrial fibrillation with RVR ([place]) 04/28/2021   Hypertrophic toenail 04/22/2021   Cough 03/24/2021   Pedal edema 03/24/2021   Leg pain, left 03/24/2021   Seropositive rheumatoid arthritis ([place]) 11/25/2020   Bilateral hand pain 11/25/2020   Bilateral shoulder pain 10/16/2020   Joint pain 10/16/2020   Joint swelling 10/16/2020   Estrogen deficiency 04/30/2020   Low back pain 03/15/2020  Dry mouth 03/15/2020   Osteoarthritis of right hip 01/02/2020   Groin pain, right 01/01/2020   Trochanteric bursitis of right hip 11/09/2018   Transient global amnesia 05/12/2018   Right thyroid nodule 05/12/2018   History of TIA (transient ischemic attack) 05/09/2018   Aortic insufficiency 09/10/2015   Stress reaction 09/08/2015   Encounter for Medicare annual wellness exam  02/28/2013   Bucket-handle tear of lateral meniscus of left knee as current injury 12/30/2012   Episodic atrial fibrillation ([place]) 11/17/2012   Special screening for malignant neoplasms, colon 01/21/2012   Hearing loss of both ears 01/14/2012   HEADACHE, CHRONIC 09/24/2010   B12 deficiency 03/17/2010   DYSPEPSIA 03/17/2010   POSTMENOPAUSAL STATUS 08/20/2008   Essential hypertension 07/19/2007   Hyperlipidemia 06/24/2007   CONSTIPATION 06/24/2007   IBS 06/24/2007   DERMATITIS, ATOPIC 06/24/2007   SKIN CANCER, HX OF 06/24/2007    Past Medical History:  Diagnosis Date   A-fib ([place])    Anginal pain ([place])    Aortic insufficiency    a. 02/2020 Echo: EF 60-65%, no rwma, Gr1 DD, nl RV fxn, mildly dil LA, triv AI, Asc Ao 22m.   Arthritis    Knees   B12 deficiency    Mild   Bucket-handle tear of lateral meniscus of left knee as current injury 12/30/2012   Cancer ([place]    Skin, basal cell on nose and eyelid   DJD (degenerative joint disease)    Low back   Dyspepsia    GERD (gastroesophageal reflux disease)    Heart murmur    Heart palpitations    History of cardiac cath    a. 02/2017 Cath: Nl cors. Nl LV fxn. Abd Aogram w/o RAS.   HOH (hard of hearing)    Hyperlipidemia    Hypertension    IBS (irritable bowel syndrome)    Lactose intolerance    Mixed incontinence    PONV (postoperative nausea and vomiting)    Vulvar irritation    from urine pad, uses Triamcinolone oint.    Family History  Problem Relation Age of Onset   Cancer Mother        Colon   Heart disease Father        CAD   Diabetes Sister    Hypothyroidism Sister    Diabetes Brother    Leukemia Brother    Esophageal cancer Brother    Cancer Paternal Aunt        Breast   Diabetes Sister    Alzheimer's disease Sister    Diabetes Brother    Breast cancer Daughter    Past Surgical History:  Procedure Laterality Date   ABDOMINAL AORTOGRAM N/A 03/15/2017   Procedure: Abdominal Aortogram;  Surgeon: MWellington Hampshire MD;  [facility];  Service: Cardiovascular;  Laterality: N/A;   ABDOMINAL HYSTERECTOMY  1982   Large fibroids and bleeding   APPENDECTOMY     BILATERAL SALPINGOOPHORECTOMY  1986   BONE GRAFT HIP ILIAC CREST     To Left arm   BREAST REDUCTION SURGERY     BREAST SURGERY     CARDIAC CATHETERIZATION     CATARACT EXTRACTION W/PHACO Left 07/07/2016   Procedure: CATARACT EXTRACTION PHACO AND INTRAOCULAR LENS PLACEMENT (IOC);  Surgeon: WBirder Robson MD;  Location: [facility];  Service: Ophthalmology;  Laterality: Left;  [place]:10 AP% 18.3 CDE 12.91 Fluid pak lot # [id number]   CATARACT EXTRACTION W/PHACO Right 07/28/2016   Procedure: CATARACT EXTRACTION PHACO AND INTRAOCULAR LENS  PLACEMENT (IOC);  Surgeon: Birder Robson, MD;  Location: [facility];  Service: Ophthalmology;  Laterality: Right;  [place] 01:26 AP% 18.5 CDE 16.12 Fluid pack lot # [id number]   COLONOSCOPY  07/2016   Dr. Thana Farr   DILATION AND CURETTAGE OF UTERUS     miscarragesx2   ESOPHAGOGASTRODUODENOSCOPY     Polyps   HEMORRHOID SURGERY  2006   KNEE ARTHROSCOPY WITH LATERAL MENISECTOMY Left 12/30/2012   Procedure: KNEE ARTHROSCOPY WITH LATERAL MENISECTOMY;  Surgeon: Johnny Bridge, MD;  Location: [facility];  Service: Orthopedics;  Laterality: Left;  LEFT KNEE SCOPE LATERAL MENISCECTOMY   LEFT HEART CATH AND CORONARY ANGIOGRAPHY N/A 03/15/2017   Procedure: Left Heart Cath and Coronary Angiography;  Surgeon: Wellington Hampshire, MD;  Location: [facility];  Service: Cardiovascular;  Laterality: N/A;   MOUTH SURGERY     skin cancer eyelid  2012   TONSILLECTOMY     Social History   Social History Narrative   Metallurgist.     Immunization History  Administered Date(s) Administered   Fluad Quad(high Dose 65+) 10/16/2020   Influenza Split 07/24/2011, 08/23/2014   Influenza Whole 08/23/2004, 07/24/2009, 08/06/2010, 08/05/2012   Influenza, High Dose Seasonal PF 08/11/2016, 08/27/2017,  08/02/2018, 09/05/2021   Influenza,inj,Quad PF,6+ Mos 08/16/2013, 08/07/2015   Influenza-Unspecified 09/05/2021   PFIZER(Purple Top)SARS-COV-2 Vaccination 12/15/2019, 01/05/2020, 10/25/2020   Pneumococcal Conjugate-13 08/24/2014   Pneumococcal Polysaccharide-23 08/20/2008   Td 03/20/2003, 02/28/2013   Zoster, Live 09/04/2015     Objective: Vital Signs: BP (!) 153/69 (BP Location: Left Arm, Patient Position: Sitting, Cuff Size: Normal)   Pulse 91   Resp 16   Ht 5' 4"  (1.626 m)   Wt 190 lb (86.2 kg)   BMI 32.61 kg/m    Physical Exam Cardiovascular:     Rate and Rhythm: Normal rate and regular rhythm.  Pulmonary:     Effort: Pulmonary effort is normal.     Breath sounds: Normal breath sounds.  Musculoskeletal:     Right lower leg: No edema.     Left lower leg: No edema.  Skin:    General: Skin is warm and dry.     Findings: No rash.  Psychiatric:        Mood and Affect: Mood normal.     Musculoskeletal Exam:  Shoulders full ROM no tenderness or swelling Elbows full ROM no tenderness or swelling Wrists full ROM no tenderness or swelling Fingers full ROM no tenderness or swelling Knees full ROM no tenderness or swelling   CDAI Exam: CDAI Score: 2  Patient Global: 10 mm; Provider Global: 10 mm Swollen: 0 ; Tender: 0  Joint Exam 10/22/2021   All documented joints were normal     Investigation: No additional findings.  Imaging: No results found.  Recent Labs: Lab Results  Component Value Date   WBC 10.3 10/07/2021   HGB 12.1 10/07/2021   PLT 467 (H) 10/07/2021   NA 137 10/07/2021   K 4.4 10/07/2021   CL 100 10/07/2021   CO2 27 10/07/2021   GLUCOSE 86 10/07/2021   BUN 20 10/07/2021   CREATININE 0.75 10/07/2021   BILITOT 0.5 10/07/2021   ALKPHOS 58 06/08/2021   AST 12 10/07/2021   ALT 7 10/07/2021   PROT 6.6 10/07/2021   ALBUMIN 3.9 06/08/2021   CALCIUM 9.7 10/07/2021   GFRAA 71 02/02/2020    Speciality Comments: No specialty comments  available.  Procedures:  No procedures performed Allergies: Antihistamines, chlorpheniramine-type; Atenolol; Cefuroxime  axetil; Ciprofloxacin; Co q 10 [coenzyme q10]; Crestor [rosuvastatin calcium]; Dextromethorphan-guaifenesin; Epinephrine; Ramipril; Sulfamethoxazole-trimethoprim; and Vitamin d analogs   Assessment / Plan:     Visit Diagnoses: Seropositive rheumatoid arthritis ([place]) - Plan: predniSONE (DELTASONE) 5 MG tablet, methotrexate (RHEUMATREX) 2.5 MG tablet, folic acid (FOLVITE) 1 MG tablet  Symptoms and labs now appear most consistent with seropositive RA, currently doing great but on significant prednisone of 10 mg daily. Starting methotrexate 15 mg PO weekly and folic acid 1 mg daily she can decrease prednisone by half to 5 mg during iniital period for symptom control.  Chronic pain of both shoulders  Not clear whether this was more related to degenerative disease and rotator cuff arthropathy versus inflammation but currently much improved, will follow up progress on steroid sparing DMARD.  High risk medication use  Starting methotrexate discussed risks of medication and need lab follow up within 1 month for repeat CBC and CMP monitoring.  Orders: No orders of the defined types were placed in this encounter.  Meds ordered this encounter  Medications   predniSONE (DELTASONE) 5 MG tablet    Sig: Take 1 tablet (5 mg total) by mouth daily with breakfast.    Dispense:  30 tablet    Refill:  0   methotrexate (RHEUMATREX) 2.5 MG tablet    Sig: Take 6 tablets (15 mg total) by mouth once a week. Caution:Chemotherapy. Protect from light.    Dispense:  30 tablet    Refill:  0   folic acid (FOLVITE) 1 MG tablet    Sig: Take 1 tablet (1 mg total) by mouth daily.    Dispense:  90 tablet    Refill:  0      Follow-Up Instructions: Return in about 4 weeks (around 11/19/2021) for RA MTX start/GC taper f/u 42mo   CCollier Salina MD  Note - This record has been created using  [employer]  Chart creation errors have been sought, but may not always  have been located. Such creation errors do not reflect on  the standard of medical care.

## 2021-10-22 ENCOUNTER — Encounter: Payer: Self-pay | Admitting: Internal Medicine

## 2021-10-22 ENCOUNTER — Other Ambulatory Visit: Payer: Self-pay

## 2021-10-22 ENCOUNTER — Ambulatory Visit (INDEPENDENT_AMBULATORY_CARE_PROVIDER_SITE_OTHER): Payer: Medicare Other | Admitting: Internal Medicine

## 2021-10-22 VITALS — BP 153/69 | HR 91 | Resp 16 | Ht 64.0 in | Wt 190.0 lb

## 2021-10-22 DIAGNOSIS — M059 Rheumatoid arthritis with rheumatoid factor, unspecified: Secondary | ICD-10-CM

## 2021-10-22 DIAGNOSIS — G8929 Other chronic pain: Secondary | ICD-10-CM

## 2021-10-22 DIAGNOSIS — M25511 Pain in right shoulder: Secondary | ICD-10-CM | POA: Diagnosis not present

## 2021-10-22 DIAGNOSIS — M542 Cervicalgia: Secondary | ICD-10-CM | POA: Diagnosis not present

## 2021-10-22 DIAGNOSIS — Z79899 Other long term (current) drug therapy: Secondary | ICD-10-CM

## 2021-10-22 DIAGNOSIS — M25512 Pain in left shoulder: Secondary | ICD-10-CM

## 2021-10-22 MED ORDER — PREDNISONE 5 MG PO TABS: 5.0000 mg | ORAL_TABLET | Freq: Every day | ORAL | 0 refills | Status: AC

## 2021-10-22 MED ORDER — METHOTREXATE 2.5 MG PO TABS
15.0000 mg | ORAL_TABLET | ORAL | 0 refills | Status: DC
Start: 2021-10-22 — End: 2021-11-26

## 2021-10-22 MED ORDER — FOLIC ACID 1 MG PO TABS: 1.0000 mg | ORAL_TABLET | Freq: Every day | ORAL | 0 refills | Status: DC

## 2021-10-22 NOTE — Patient Instructions (Signed)
Methotrexate Tablets What is this medication? METHOTREXATE (METH oh TREX ate) treats inflammatory conditions such as arthritis and psoriasis. It works by decreasing inflammation, which can reduce pain and prevent long-term injury to the joints and skin. It may also be used to treat some types of cancer. It works by slowing down the growth of cancer cells. This medicine may be used for other purposes; ask your health care provider or pharmacist if you have questions. COMMON BRAND NAME(S): Rheumatrex, Trexall What should I tell my care team before I take this medication? They need to know if you have any of these conditions: Fluid in the stomach area or lungs If you often drink alcohol Infection or immune system problems Kidney disease or on hemodialysis Liver disease Low blood counts, like low white cell, platelet, or red cell counts Lung disease Radiation therapy Stomach ulcers Ulcerative colitis An unusual or allergic reaction to methotrexate, other medications, foods, dyes, or preservatives Pregnant or trying to get pregnant Breast-feeding How should I use this medication? Take this medication by mouth with a glass of water. Follow the directions on the prescription label. Take your medication at regular intervals. Do not take it more often than directed. Do not stop taking except on your care team's advice. Make sure you know why you are taking this medication and how often you should take it. If this medication is used for a condition that is not cancer, like arthritis or psoriasis, it should be taken weekly, NOT daily. Taking this medication more often than directed can cause serious side effects, even death. Talk to your care team about safe handling and disposal of this medication. You may need to take special precautions. Talk to your care team about the use of this medication in children. While this medication may be prescribed for selected conditions, precautions do  apply. Overdosage: If you think you have taken too much of this medicine contact a poison control center or emergency room at once. NOTE: This medicine is only for you. Do not share this medicine with others. What if I miss a dose? If you miss a dose, talk with your care team. Do not take double or extra doses. What may interact with this medication? Do not take this medication with any of the following: Acitretin This medication may also interact with the following: Aspirin and aspirin-like medications including salicylates Azathioprine Certain antibiotics like penicillins, tetracycline, and chloramphenicol Certain medications that treat or prevent blood clots like warfarin, apixaban, dabigatran, and rivaroxaban Certain medications for stomach problems like esomeprazole, omeprazole, pantoprazole Cyclosporine Dapsone Diuretics Gold Hydroxychloroquine Live virus vaccines Medications for infection like acyclovir, adefovir, amphotericin B, bacitracin, cidofovir, foscarnet, ganciclovir, gentamicin, pentamidine, vancomycin Mercaptopurine NSAIDs, medications for pain and inflammation, like ibuprofen or naproxen Other cytotoxic agents Pamidronate Pemetrexed Penicillamine Phenylbutazone Phenytoin Probenecid Pyrimethamine Retinoids such as isotretinoin and tretinoin Steroid medications like prednisone or cortisone Sulfonamides like sulfasalazine and trimethoprim/sulfamethoxazole Theophylline Zoledronic acid This list may not describe all possible interactions. Give your health care provider a list of all the medicines, herbs, non-prescription drugs, or dietary supplements you use. Also tell them if you smoke, drink alcohol, or use illegal drugs. Some items may interact with your medicine. What should I watch for while using this medication? Avoid alcoholic drinks. This medication can make you more sensitive to the sun. Keep out of the sun. If you cannot avoid being in the sun, wear  protective clothing and use sunscreen. Do not use sun lamps or tanning beds/booths. You may need  blood work done while you are taking this medication. Call your care team for advice if you get a fever, chills or sore throat, or other symptoms of a cold or flu. Do not treat yourself. This medication decreases your body's ability to fight infections. Try to avoid being around people who are sick. This medication may increase your risk to bruise or bleed. Call your care team if you notice any unusual bleeding. Be careful brushing or flossing your teeth or using a toothpick because you may get an infection or bleed more easily. If you have any dental work done, tell your dentist you are receiving this medication. Check with your care team if you get an attack of severe diarrhea, nausea and vomiting, or if you sweat a lot. The loss of too much body fluid can make it dangerous for you to take this medication. Talk to your care team about your risk of cancer. You may be more at risk for certain types of cancers if you take this medication. Do not become pregnant while taking this medication or for 6 months after stopping it. Women should inform their care team if they wish to become pregnant or think they might be pregnant. Men should not father a child while taking this medication and for 3 months after stopping it. There is potential for serious harm to an unborn child. Talk to your care team for more information. Do not breast-feed an infant while taking this medication or for 1 week after stopping it. This medication may make it more difficult to get pregnant or father a child. Talk to your care team if you are concerned about your fertility. What side effects may I notice from receiving this medication? Side effects that you should report to your care team as soon as possible: Allergic reactions--skin rash, itching, hives, swelling of the face, lips, tongue, or throat Blood clot--pain, swelling, or warmth  in the leg, shortness of breath, chest pain Dry cough, shortness of breath or trouble breathing Infection--fever, chills, cough, sore throat, wounds that don't heal, pain or trouble when passing urine, general feeling of discomfort or being unwell Kidney injury--decrease in the amount of urine, swelling of the ankles, hands, or feet Liver injury--right upper belly pain, loss of appetite, nausea, light-colored stool, dark yellow or brown urine, yellowing of the skin or eyes, unusual weakness or fatigue Low red blood cell count--unusual weakness or fatigue, dizziness, headache, trouble breathing Redness, blistering, peeling, or loosening of the skin, including inside the mouth Seizures Unusual bruising or bleeding Side effects that usually do not require medical attention (report to your care team if they continue or are bothersome): Diarrhea Dizziness Hair loss Nausea Pain, redness, or swelling with sores inside the mouth or throat Vomiting This list may not describe all possible side effects. Call your doctor for medical advice about side effects. You may report side effects to FDA at 1-800-FDA-1088. Where should I keep my medication? Keep out of the reach of children and pets. Store at room temperature between 20 and 25 degrees C (68 and 77 degrees F). Protect from light. Get rid of any unused medication after the expiration date. Talk to your care team about how to dispose of unused medication. Special directions may apply. NOTE: This sheet is a summary. It may not cover all possible information. If you have questions about this medicine, talk to your doctor, pharmacist, or health care provider.  2022 Elsevier/Gold Standard (2021-01-13 00:00:00)

## 2021-10-23 NOTE — Progress Notes (Signed)
Chronic Care Management Pharmacy Note  10/29/2021 Name:  Tammy Manning MRN:  [id number] DOB:  08-04-1935  Summary: -Pt reports amitriptyline has helped significantly with pain -Pt was recently diagnosed with RA; she has not yet started methotrexate due to being on antibiotics for UTI - she will start MTX once abx complete. Discussed potential drug interactions with MTX. -Pt is drinking cranberry juice daily to help with UTIs. Discussed relative lack of benefit with cranberry juice/supplements for UTI prevention. Of note she is not using Estrace cream anymore as vaginal bleeding has resolved, discussed vaginal estrogen can be helpful for recurrent UTIs  Recommendations/Changes made from today's visit: -Stop/avoid omeprazole and pepto bismol due to interaction with methotrexate. Use famotidine 20 mg PRN for reflux. -Advised to resume Estrace cream 2-3 nights per week for recurrent UTI. Can stop cranberry juice and just focus on adequate fluid intake.  Plan: -[facility] will call patient in 2 months to monitor improvement in UTI, RA -Pharmacist follow up televisit scheduled for 6 months   Subjective: Tammy Manning is an 86 y.o. year old female who is a primary patient of [name], [name], MD.  The CCM team was consulted for assistance with disease management and care coordination needs.    Engaged with patient by telephone for initial visit in response to provider referral for pharmacy case management and/or care coordination services.   Consent to Services:  The patient was given the following information about Chronic Care Management services today, agreed to services, and gave verbal consent: 1. CCM service includes personalized support from designated clinical staff supervised by the primary care provider, including individualized plan of care and coordination with other care providers 2. 24/7 contact phone numbers for assistance for urgent and routine care needs. 3. Service  will only be billed when office clinical staff spend 20 minutes or more in a month to coordinate care. 4. Only one practitioner may furnish and bill the service in a calendar month. 5.The patient may stop CCM services at any time (effective at the end of the month) by phone call to the office staff. 6. The patient will be responsible for cost sharing (co-pay) of up to 20% of the service fee (after annual deductible is met). Patient agreed to services and consent obtained.  Patient Care Team: [name], [name], MD as PCP - General Wellington Hampshire, MD as PCP - Cardiology (Cardiology) Susa Day, MD (Orthopedic Surgery) Richmond Campbell, MD (Gastroenterology) Wellington Hampshire, MD as Consulting Physician (Cardiology) Birder Robson, MD as Referring Physician (Ophthalmology) Charlton Haws, [facility] as Pharmacist (Pharmacist)  Patient lives at home with her husband. She is a retired Art therapist, her husband is a retired Engineer, structural. She enjoys oil painting.  Recent office visits: 10/02/21 Dr Glori Bickers OV: acute visit for shoulder pain; advised to discuss with rheum; consider amitriptyline 09/25/21 pt message: c/o UTI. UA positive. Rx'd Keflex 07/22/21 pt message: c/o UTI. Rx Macrobid. 07/10/21 pt message: c/o UTI. UA positive. Rx'd Keflex.  07/04/21 Dr Glori Bickers OV: f/u vaginal bleeding, UTI. Given B12 shot. Bleeding resolved. No med changes.  06/12/21 Dr Glori Bickers OV: acute visit for vaginal bleeding; PAP cytology abnormal. referred to Gyn.  05/07/21 Dr Glori Bickers OV: hospital f/u; new Afib dx. D/C meloxicam.   Recent consult visits: 10/22/21 Dr Benjamine Mola (rheumatology): Dx rheumatoid arthritis; Rx'd methorexate 15 mg weekly, folic acid 1 mg daily; reduce prednisone to 5 mg daily (temporary until MTX started).  10/07/21 Dr Benjamine Mola (rheumatology): f/u chronic joint pain.  Rx prednisone taper x 20 days. Recheck sed rate, CRP. Plan for oral methotrexate  08/05/21 NP Ignacia Bayley (cardiology): f/u Afib (Dx  04/2021). No med changes. F/u 4 months.  07/18/21 Dr Amalia Hailey (OB/GYN): f/u vaginal bleeding -likely anticoagulation and atrophic vaginitis. Rx'd Estrace cream 2-3 times weekly.  Hospital visits: 06/08/2021 [facility] Emergency Department - Patient presented for Atrial Fibrillation with RVR. CHANGE: diltiazem (CARDIZEM CD) 240 MG 24 hr capsule VS. 120 mg oral daily. Patient discharged same day.    Medication Reconciliation was completed by comparing discharge summary, patient's EMR and Pharmacy list, and upon discussion with patient.   Admitted to the hospital on 04/29/2021 due to Afib w/ RVR. Discharge date was 04/30/2021. Discharged from [place]?Medications Started at [facility] Discharge:?? -started apixaban (ELIQUIS) 5 MG TABS tablet -started diltiazem (CARDIZEM CD) 120 MG 24 hr capsule   Medication Changes at Hospital Discharge: No changes noted   Medications Discontinued at Hospital Discharge: -Stopped Amlodipine 2.5 mg tablet -Stopped Aspirin EC 81 mg tablet -Stopped Chlorthalidone 25 mg tablet   Medications that remain the same after Hospital Discharge:??  -All other medications will remain the same.     Objective:  Lab Results  Component Value Date   CREATININE 0.75 10/07/2021   BUN 20 10/07/2021   GFR 63.06 04/22/2021   GFRNONAA >60 06/08/2021   GFRAA 71 02/02/2020   NA 137 10/07/2021   K 4.4 10/07/2021   CALCIUM 9.7 10/07/2021   CO2 27 10/07/2021   GLUCOSE 86 10/07/2021    Lab Results  Component Value Date/Time   HGBA1C 5.7 (H) 05/10/2018 05:11 AM   GFR 63.06 04/22/2021 12:36 PM   GFR 58.97 (L) 02/06/2021 01:22 PM    Last diabetic Eye exam: No results found for: HMDIABEYEEXA  Last diabetic Foot exam: No results found for: HMDIABFOOTEX   Lab Results  Component Value Date   CHOL 201 (H) 04/25/2020   HDL 66.80 04/25/2020   LDLCALC 107 (H) 04/25/2020   LDLDIRECT 106 (H) 02/02/2020   TRIG 132.0 04/25/2020   CHOLHDL 3  04/25/2020    Hepatic Function Latest Ref Rng & Units 10/07/2021 06/08/2021 04/28/2021  Total Protein 6.1 - 8.1 g/dL 6.6 6.9 6.5  Albumin 3.5 - 5.0 g/dL - 3.9 3.6  AST 10 - 35 U/L 12 22 17   ALT 6 - 29 U/L 7 16 12   Alk Phosphatase 38 - 126 U/L - 58 56  Total Bilirubin 0.2 - 1.2 mg/dL 0.5 0.9 0.7  Bilirubin, Direct 0.0 - 0.3 mg/dL - - -    Lab Results  Component Value Date/Time   TSH 1.33 04/25/2020 09:01 AM   TSH 1.77 04/27/2019 10:45 AM    CBC Latest Ref Rng & Units 10/07/2021 06/08/2021 04/29/2021  WBC 3.8 - 10.8 Thousand/uL 10.3 8.9 10.1  Hemoglobin 11.7 - 15.5 g/dL 12.1 12.2 11.9(L)  Hematocrit 35.0 - 45.0 % 37.0 37.0 35.7(L)  Platelets 140 - 400 Thousand/uL 467(H) 354 376    Lab Results  Component Value Date/Time   VD25OH 33 08/22/2009 10:02 PM    Clinical ASCVD: No  The ASCVD Risk score (Arnett DK, et al., 2019) failed to calculate for the following reasons:   The 2019 ASCVD risk score is only valid for ages 40 to 75    Depression screen PHQ 2/9 06/12/2021 04/26/2020 04/25/2019  Decreased Interest 0 0 0  Down, Depressed, Hopeless 0 0 0  PHQ - 2 Score 0 0 0  Altered  sleeping 0 0 0  Tired, decreased energy 3 0 0  Change in appetite 0 0 0  Feeling bad or failure about yourself  0 0 0  Trouble concentrating 1 0 0  Moving slowly or fidgety/restless 0 0 0  Suicidal thoughts 0 0 0  PHQ-9 Score 4 0 0  Difficult doing work/chores Not difficult at all Not difficult at all Not difficult at all  Some recent data might be hidden     CHA2DS2-VASc Score = 6  The patient's score is based upon: CHF History: 0 HTN History: 1 Diabetes History: 0 Stroke History: 2 Vascular Disease History: 0 Age Score: 2 Gender Score: 1      Social History   Tobacco Use  Smoking Status Former   Packs/day: 1.00   Years: 12.00   Pack years: 12.00   Types: Cigarettes   Quit date: 11/24/1963   Years since quitting: 57.9  Smokeless Tobacco Never   BP Readings from Last 3 Encounters:   10/22/21 (!) 153/69  10/07/21 (!) 151/63  10/02/21 130/70   Pulse Readings from Last 3 Encounters:  10/22/21 91  10/07/21 87  10/02/21 68   Wt Readings from Last 3 Encounters:  10/22/21 190 lb (86.2 kg)  10/07/21 190 lb (86.2 kg)  10/02/21 189 lb 6 oz (85.9 kg)   BMI Readings from Last 3 Encounters:  10/22/21 32.61 kg/m  10/07/21 32.11 kg/m  10/02/21 31.51 kg/m    Assessment/Interventions: Review of patient past medical history, allergies, medications, health status, including review of consultants reports, laboratory and other test data, was performed as part of comprehensive evaluation and provision of chronic care management services.   SDOH:  (Social Determinants of Health) assessments and interventions performed: Yes  SDOH Screenings   Alcohol Screen: Not on file  Depression (PHQ2-9): Low Risk    PHQ-2 Score: 4  Financial Resource Strain: Not on file  Food Insecurity: Not on file  Housing: Not on file  Physical Activity: Not on file  Social Connections: Not on file  Stress: Not on file  Tobacco Use: Medium Risk   Smoking Tobacco Use: Former   Smokeless Tobacco Use: Never   Passive Exposure: Not on file  Transportation Needs: Not on file    [place]  Allergies  Allergen Reactions   Antihistamines, Chlorpheniramine-Type Other (See Comments)    Reaction:  Makes pt hyper    Atenolol Hypertension   Cefuroxime Axetil Other (See Comments)    Upset stomach   Ciprofloxacin Other (See Comments)    Upset stomach   Co Q 10 [Coenzyme Q10] Other (See Comments)    Reaction:  GI upset    Crestor [Rosuvastatin Calcium] Other (See Comments)    Reaction:  Myalgias    Dextromethorphan-Guaifenesin Other (See Comments)    Reaction:  Made pt jittery    Epinephrine Hives   Ramipril Other (See Comments)    Upset stomach   Sulfamethoxazole-Trimethoprim Other (See Comments)    Upset stomach   Vitamin D Analogs Other (See Comments)    Reaction:  GI upset      Medications Reviewed Today     Reviewed by Charlton Haws, [facility] (Pharmacist) on 10/28/21 at 1228  Med List Status: <None>   Medication Order Taking? Sig Documenting Provider Last Dose Status Informant  acetaminophen (TYLENOL) 500 MG tablet [id number] Yes Take 500 mg by mouth every 6 (six) hours as needed. [provider] Taking Active Self  amitriptyline (ELAVIL) 10 MG tablet [id number] Yes  Take one pill each bedtime [name], [name], MD Taking Active   apixaban (ELIQUIS) 5 MG TABS tablet [id number] Yes Take 1 tablet (5 mg total) by mouth 2 (two) times daily. Wellington Hampshire, MD Taking Active   atorvastatin (LIPITOR) 10 MG tablet [id number] Yes TAKE 1 TABLET BY MOUTH EVERY DAY AT 6 PM [name], [name], MD Taking Active   Calcium Carbonate-Vitamin D (CALCIUM 600+D PO) [id number] Yes Take 1 tablet by mouth daily.  [provider] Taking Active Self  clobetasol ointment (TEMOVATE) 0.05 % [id number] Yes Apply 1 application topically 2 (two) times daily as needed. [provider] Taking Active Self  clorazepate (TRANXENE) 7.5 MG tablet [id number] Yes Take 1 tablet (7.5 mg total) by mouth daily as needed. [name], [name], MD Taking Active   cyanocobalamin (,VITAMIN B-12,) 1000 MCG/ML injection [id number] Yes Inject 1,000 mcg into the muscle every 30 (thirty) days.  [provider] Taking Active Self  diltiazem (CARDIZEM CD) 180 MG 24 hr capsule [id number] Yes Take 1 capsule (180 mg total) by mouth daily. Theora Gianotti, NP Taking Active   folic acid (FOLVITE) 1 MG tablet [id number] Yes Take 1 tablet (1 mg total) by mouth daily. Collier Salina, MD Taking Active   guaiFENesin (MUCINEX) 600 MG 12 hr tablet [id number] Yes Take 600 mg by mouth 2 (two) times daily as needed. [provider] Taking Active   irbesartan (AVAPRO) 300 MG tablet [id number] Yes TAKE 1 TABLET BY MOUTH DAILY [name], [name], MD Taking Active Self  loperamide (IMODIUM) 2 MG capsule  [id number] Yes Take 2 mg by mouth as needed for diarrhea or loose stools. [provider] Taking Active   MAGNESIUM LACTATE PO [id number] Yes Take by mouth 2 (two) times daily. [provider] Taking Active   methotrexate (RHEUMATREX) 2.5 MG tablet [id number]  Take 6 tablets (15 mg total) by mouth once a week. Caution:Chemotherapy. Protect from light. Collier Salina, MD  Active   nitrofurantoin, macrocrystal-monohydrate, (MACROBID) 100 MG capsule [id number] Yes Take 1 capsule (100 mg total) by mouth 2 (two) times daily. Harlin Heys, MD Taking Active   predniSONE (DELTASONE) 5 MG tablet [id number] Yes Take 1 tablet (5 mg total) by mouth daily with breakfast. Collier Salina, MD Taking Active   traMADol (ULTRAM) 50 MG tablet [id number] Yes Take 50 mg by mouth 3 (three) times daily as needed. [provider] Taking Active   triamcinolone (NASACORT) 55 MCG/ACT AERO nasal inhaler [id number] Yes Place 2 sprays into the nose daily. [name], [name], MD Taking Active Self            Patient Active Problem List   Diagnosis Date Noted   High risk medication use 10/07/2021   Blood urine 06/12/2021   Vaginal bleeding 06/12/2021   Atrial fibrillation with RVR ([place]) 04/28/2021   Hypertrophic toenail 04/22/2021   Cough 03/24/2021   Pedal edema 03/24/2021   Leg pain, left 03/24/2021   Seropositive rheumatoid arthritis ([place]) 11/25/2020   Bilateral hand pain 11/25/2020   Bilateral shoulder pain 10/16/2020   Joint pain 10/16/2020   Joint swelling 10/16/2020   Estrogen deficiency 04/30/2020   Low back pain 03/15/2020   Dry mouth 03/15/2020   Osteoarthritis of right hip 01/02/2020   Groin pain, right 01/01/2020   Trochanteric bursitis of right hip 11/09/2018   Transient global amnesia 05/12/2018   Right thyroid nodule 05/12/2018   History of TIA (transient ischemic attack) 05/09/2018   Aortic insufficiency 09/10/2015  Stress reaction 09/08/2015   Encounter for  Medicare annual wellness exam 02/28/2013   Bucket-handle tear of lateral meniscus of left knee as current injury 12/30/2012   Episodic atrial fibrillation ([place]) 11/17/2012   Special screening for malignant neoplasms, colon 01/21/2012   Hearing loss of both ears 01/14/2012   HEADACHE, CHRONIC 09/24/2010   B12 deficiency 03/17/2010   DYSPEPSIA 03/17/2010   POSTMENOPAUSAL STATUS 08/20/2008   Essential hypertension 07/19/2007   Hyperlipidemia 06/24/2007   CONSTIPATION 06/24/2007   IBS 06/24/2007   DERMATITIS, ATOPIC 06/24/2007   SKIN CANCER, HX OF 06/24/2007    Immunization History  Administered Date(s) Administered   Fluad Quad(high Dose 65+) 10/16/2020   Influenza Split 07/24/2011, 08/23/2014   Influenza Whole 08/23/2004, 07/24/2009, 08/06/2010, 08/05/2012   Influenza, High Dose Seasonal PF 08/11/2016, 08/27/2017, 08/02/2018, 09/05/2021   Influenza,inj,Quad PF,6+ Mos 08/16/2013, 08/07/2015   Influenza-Unspecified 09/05/2021   PFIZER(Purple Top)SARS-COV-2 Vaccination 12/15/2019, 01/05/2020, 10/25/2020   Pneumococcal Conjugate-13 08/24/2014   Pneumococcal Polysaccharide-23 08/20/2008   Td 03/20/2003, 02/28/2013   Zoster, Live 09/04/2015    Conditions to be addressed/monitored:  Hypertension, Hyperlipidemia, Atrial Fibrillation, Coronary Artery Disease, GERD, Anxiety, and Osteopenia, Rheumatoid arthritis, Recurrent UTIs  Care Plan : [place]  Updates made by Charlton Haws, [place] since 10/29/2021 12:00 AM     Problem: Hypertension, Hyperlipidemia, Atrial Fibrillation, Coronary Artery Disease, GERD, Anxiety, and Osteopenia, Rheumatoid arthritis, Recurrent UTIs   Priority: High     Long-Range Goal: Disease mgmt   Start Date: 10/28/2021  Expected End Date: 10/29/2022  This Visit's Progress: On track  Priority: High  Note:   Current Barriers:  Unable to independently monitor therapeutic efficacy Unable to maintain control of recurrent UTI  Pharmacist  Clinical Goal(s):  Patient will achieve adherence to monitoring guidelines and medication adherence to achieve therapeutic efficacy adhere to plan to optimize therapeutic regimen for recurrent UTI as evidenced by report of adherence to recommended medication management changes through collaboration with PharmD and provider.   Interventions: 1:1 collaboration with [name], [name], MD regarding development and update of comprehensive plan of care as evidenced by provider attestation and co-signature Inter-disciplinary care team collaboration (see longitudinal plan of care) Comprehensive medication review performed; medication list updated in electronic medical record  Hypertension (BP goal <140/90) -Controlled - pt reports BP has been higher since being on prednisone; she is planning to stop prednisone once she can start methotrexate -Current home readings: 130/60 before meds in AM; 145/70 after prednisone -Current treatment: Irbesartan 300 mg daily Diltiazem 180 mg daily -Medications previously tried: n/a  -Denies hypotensive/hypertensive symptoms -Educated on BP goals and benefits of medications for prevention of heart attack, stroke and kidney damage; Importance of home blood pressure monitoring; -Counseled to monitor BP at home 1-2x weekly -Recommended to continue current medication  Hyperlipidemia: (LDL goal < 70) -Not ideally controlled - LDL is above goal although results are > 61 year old; current control is reasonable given patient age and lack of ASCVD events; she reports persistent cramps in hands and feet for which she takes a magnesium supplement -Hx aortic atherosclerosis (CXR 05/2021). Cardiac cath 02/2017 normal arteries. -Current treatment: Atorvastatin 10 mg daily -Educated on Cholesterol goals; Benefits of statin for ASCVD risk reduction; Importance of limiting foods high in cholesterol; -Recommended to continue current medication  Atrial Fibrillation (Goal: prevent stroke  and major bleeding) -Controlled - pt reports improvement in dizzy spells since being on diltiazem; she denies bleeding issues -CHADSVASC: 6; Dx 04/2021  -Current treatment: Rate  control: Diltiazem CD 180 mg daily Anticoagulation: Eliquis 5 mg BID -Counseled on increased risk of stroke due to Afib and benefits of anticoagulation for stroke prevention; importance of adherence to anticoagulant exactly as prescribed; bleeding risk associated with Eliquis and importance of self-monitoring for signs/symptoms of bleeding; -Recommended to continue current medication  Rheumatoid arthritis (Goal: manage symptoms) -Not ideally controlled - pt reports recent addition of amitriptyline has helped significantly; she has been on prednisone taper throughout November which will end when she starts Methotrexate, which is currently on hold due to antibiotics for UTI - pt has discussed this with rheumatology and will start MTX when she finished abx course in 7 days; she will start folic acid 1 mg daily -Dx 09/2021 -Current treatment  Amitriptyline 10 mg daily HS  Methotrexate 15 mg weekly Folic acid 1 mg daily Prednisone 5 mg daily Tramadol 50 mg TID prn Tylenol 500 mg PRN -Counseled on importance of folic acid supplementation while taking methotrexate; discussed frequent lab monitoring with MTX and multiple drug interactions; advised to contact rheumatologist with any questions/concerns regarding MTX -Advised pt to stop omeprazole and Pepto Bismol due to interactions with MTX -Recommended to continue current medication  Depression/Anxiety (Goal: manage symptoms) -Controlled - pt reports amitriptyline is mainly for pain; she does not feel depressed; she does not use clorazepate often, typically only when driving with her husband -Current treatment: Amitriptyline 10 mg HS Clorazepate 7.5 mg PRN  -PHQ9: 4 -GAD7: not on file -Connected with PCP for mental health support -Recommended to continue current  medication  GERD (Goal: manage symptoms) -Not ideally controlled - pt read that omeprazole cannot be taken with methotrexate and she has stopped it; she reports occasional reflux symptoms instigated by certain foods; she has not tried other antacids -Current treatment  Omeprazole 20 mg BID -Counseled on interaction between MTX and omeprazle - advised to avoid PPIs. H2RAs do not have the same interactions -Removed omeprazole from med list -Recommend famotidine 20 mg PRN reflux  Osteopenia (Goal prevent fractures) -Controlled - DEXA scan up to date; pt endorses compliance with ca-vit D -Last DEXA Scan: 05/31/20   T-Score femoral neck: -1.1  T-Score lumbar spine: +1.2  10-year probability of major osteoporotic fracture: 11.3%  10-year probability of hip fracture: 2.5% -Patient is not a candidate for pharmacologic treatment -Current treatment  Calcium carbonate-Vitamin D  -Recommend [phone] units of vitamin D daily. Recommend 1200 mg of calcium daily from dietary and supplemental sources. Recommend weight-bearing and muscle strengthening exercises for building and maintaining bone density.  Recurrent UTI (Goal: reduce frequency of UTI) -Not ideally controlled - pt has had at least 4 UTIs in previous 6 months; she was prescribed Estrace cream by gynecologist, this was prescribed for vaginal bleeding and stopped using it when bleeding stopped -Pt is drinking sugar-free cranberry juice daily to help with UTIs -Current treatment  Estrace cream - apply vaginally 2-3 times weekly -Discussed that estrace cream can also be used for recurrent UTIs; advised to continue using 2-3 times weekly and see if UTIs decline -Discussed lack of benefit with cranberry juice/supplements on preventing UTIs; advised to drink plenty of fluids (water)  Health Maintenance -Vaccine gaps: Shingrix, Covid booster -Pt declined additional covid vaccines -Current therapy:  Vitamin B12 1000 mcg injection q 30  days Magnesium lactate BID Nasacort nasal spray PRN - spring and fall Clobetasaol 0.05% ointment Nystatin suspension Linzess - infrequent use, 2-3 times a year Mucinex PRN Pepto-Bismol chew PRN Loperamide 2 mg PRN -Discussed drug interaction  between pepto bismol (salicylates) and methotrexate, advised to avoid pepto bismol while on MTX -Recommended to continue current medication  Patient Goals/Self-Care Activities Patient will:  - take medications as prescribed as evidenced by patient report and record review focus on medication adherence by pill box check blood pressure 1-2x weekly, document, and provide at future appointments -Use estrace cream 2-3 times weekly to help with UTIs -Can stop cranberry juice; drink plain water instead -Stop omeprazole and Pepto Bismol while taking Methotrexate. Use famotidine (Pepcid) as needed for reflux      Medication Assistance: None required.  Patient affirms current coverage meets needs.  Compliance/Adherence/Medication fill history: Care Gaps: None  Star-Rating Drugs: Atorvastatin 10 mg - LF 10/08/21 x 90 ds; PDC 100% Irbesartan 300 mg - LF 09/09/21 x 90 ds; [place] 93%  Patient's preferred pharmacy is:  [place], [place] - [address] [place] [address] Phone: [phone] Fax: [phone]  [facility] [place], [address] [address] Phone: [phone] Fax: [phone]  Uses pill box? Yes Pt endorses 100% compliance  We discussed: Current pharmacy is preferred with insurance plan and patient is satisfied with pharmacy services Patient decided to: Continue current medication management strategy  Care Plan and Follow Up Patient Decision:  Patient agrees to Care Plan and Follow-up.  Plan: Telephone follow up appointment with care management team member scheduled for:  6 months  Charlene Brooke,  PharmD, BCACP Clinical Pharmacist [facility] [phone]

## 2021-10-24 ENCOUNTER — Telehealth: Payer: Self-pay | Admitting: Family Medicine

## 2021-10-24 ENCOUNTER — Telehealth: Payer: Self-pay

## 2021-10-24 NOTE — Telephone Encounter (Signed)
Called patient reviewed all information and repeated back to me. Will call if any questions.  ? ?

## 2021-10-24 NOTE — Progress Notes (Signed)
Chronic Care Management Pharmacy Assistant   Name: Tammy Manning  MRN: [id number] DOB: [birth year]  Reason for Encounter: CCM (Initial Questions)   Recent office visits:  10/14/2021 - Sharrell Ku, CMA - Patient presented for B12 deficiency. Injection: cyanocobalamin ((VITAMIN B-12)) injection 1,000 mcg. 10/10/2021 - Loura Pardon, MD - Patient Message - Patient states in message she would like to try the medication for depression and pain. Start: amitriptyline (ELAVIL) 10 MG tablet - Take 1 at bedtime.  10/02/2021 - Loura Pardon, MD - Patient presented for shoulder pain. No medication changes. Follow up with Rheumatology for further evaluation and continue conservative management.  11/03/82022 - Loura Pardon, MD - Patient Message - Message states urine with some blood cells; most likely UTI. Start: cephALEXin (KEFLEX) 500 MG capsule. 09/09/2021 Dalia Heading, CMA - Patient presented for B12 deficiency. Injection: cyanocobalamin ((VITAMIN B-12)) injection 1,000 mcg. 08/12/2021 - Loura Pardon, MD - Patient Message - Patient states in message she has white tongue and bad feeling in her throat; UTI is resolved. Start: nystatin (MYCOSTATIN) 100000 UNIT/ML suspension 08/06/2021 - Christina Odee, CMA - Patient presented for B12 deficiency. Injection: cyanocobalamin ((VITAMIN B-12)) injection 1,000 mcg. 07/22/2021 - Loura Pardon, MD - Message - Patient states in message UTI is not resolved. Start: nitrofurantoin, macrocrystal-monohydrate, (MACROBID) 100 MG capsule until 096/13/2022. 07/10/2021 - Loura Pardon, MD - Message - Patient states in message a UTI has returned. Patient dropped off urine sample. Urine was positive for infection. Start: cephALEXin (KEFLEX) 500 MG capsule until 07/18/2021. 07/04/2021 - Loura Pardon, MD - Patient presented for follow up for hypertension. Stop due to completed course: cephALEXin (KEFLEX) 250 MG capsule. Injection: cyanocobalamin ((VITAMIN B-12)) injection 1,000  mcg. 06/19/2021 - Loura Pardon, MD - Telephone - Referral to Gynecology placed.  06/16/2021 - Loura Pardon, MD - Telephone - Patient was called for results of visit previous day. Result of culture: Urine grew out pseudomonas. Start: cephALEXin (KEFLEX) 250 MG capsule. 06/12/2021 - Loura Pardon, MD - Patient presented for vaginal bleeding. No medication changes.  06/03/2021 - Alma Friendly, NP - Internal Medicine - Patient presented for Vit B deficiency. Procedures: PR VITAMIN B12 INJECTION - cyanocobalamin ((VITAMIN B-12)) injection 1,000 mcg. 05/07/2021 - Loura Pardon, MD - Patient presented for hospital follow up for episodic atrial fibrillation. Stop due to patient preference: meloxicam (MOBIC) 15 MG tablet.  Recent consult visits:  10/07/2021 - Vernelle Emerald, MD - Rheumatology - Patient presented for follow up for Seropositive Rheumatoid Arthritis. Start: folic acid (FOLVITE) 1 MG tablet and methotrexate (RHEUMATREX) 2.5 MG tablet. Change: predniSONE (DELTASONE) 5 MG tablet vs. 30 mg total daily.  10/07/2021 - Vernelle Emerald, MD - Rheumatology - Patient presented for follow up with chronic joint pains no definite inflammatory changes at initial visits but highly positive CCP Abs and osteoarthritis of multiple joints.  Start: predniSONE (DELTASONE) 10 MG tablet. Labs: C-reactive protein, CBC, Sed Rate, CMP with GFR, Hep B core antibody, Hep B surface antigen and Hep C antibody.  08/05/2021 - Gwendolyn Grant, NP - Cardiology - Patient presented for follow up of Paroxysmal Atrial Fibrillation. Labs: EKG Stop due to completed course: estradiol (ESTRACE) 0.1 MG/GM vaginal cream and nitrofurantoin, macrocrystal-monohydrate, (MACROBID) 100 MG capsule. 07/18/2021 - Alben Deeds, MD - Patient presented for  post.menopausal bleeding. Found to be association with anticoagulation and atrophic vaginitis. Start: estradiol (ESTRACE) 0.1 MG/GM vaginal cream. 07/03/2021 - Kathlyn Sacramento, MD - Cardiology - Telephone -  Patient reported blood pressure readings. Change: diltiazem (CARDIZEM CD) 180  MG 24 hr capsule vs. 240 mg oral daily.  06/05/2021 - Kathlyn Sacramento, MD - Cardiology - Patient presented for follow-up visit regarding atrial fibrillation and aortic insufficiency.  No medication changes.  05/28/2021 - Lanae Crumbly, DPM - Podiatry - Patient presented for right medial side ankle pain and swelling x 1 week. Labs: X-ray of right foot:  no fracture, dislocation, swelling or degenerative changes noted. No medication changes.   Hospital visits:  06/08/2021 [facility] - Patient presented for Atrial Fibrillation with RVR. CHANGE: diltiazem (CARDIZEM CD) 240 MG 24 hr capsule VS. 120 mg oral daily. Patient discharged same day.   Medication Reconciliation was completed by comparing discharge summary, patient's EMR and Pharmacy list, and upon discussion with patient.  Admitted to the hospital on 04/29/2021 due to  chest discomfort. Discharge date was 04/30/2021. Discharged from [facility].    Final ED Diagnoses: Atrial fibrillation with rapid ventricular response  New?Medications Started at [facility] Discharge:?? -started apixaban (ELIQUIS) 5 MG TABS tablet -started diltiazem (CARDIZEM CD) 120 MG 24 hr capsule  Medication Changes at Hospital Discharge: No changes noted  Medications Discontinued at Hospital Discharge: -Stopped Amlodipine 2.5 mg tablet -Stopped Aspirin EC 81 mg tablet -Stopped Chlorthalidone 25 mg tablet  Medications that remain the same after Hospital Discharge:??  -All other medications will remain the same.    Medications: Outpatient Encounter Medications as of 10/24/2021  Medication Sig   acetaminophen (TYLENOL) 500 MG tablet Take 500 mg by mouth every 6 (six) hours as needed.   amitriptyline (ELAVIL) 10 MG tablet Take one pill each bedtime   apixaban (ELIQUIS) 5 MG TABS tablet Take 1 tablet (5 mg total) by mouth 2 (two) times daily.    atorvastatin (LIPITOR) 10 MG tablet TAKE 1 TABLET BY MOUTH EVERY DAY AT 6 PM   Calcium Carbonate-Vitamin D (CALCIUM 600+D PO) Take 1 tablet by mouth daily.    cephALEXin (KEFLEX) 500 MG capsule Take 1 capsule (500 mg total) by mouth 2 (two) times daily. (Patient not taking: Reported on 10/22/2021)   clobetasol ointment (TEMOVATE) 7.90 % Apply 1 application topically 2 (two) times daily as needed.   clorazepate (TRANXENE) 7.5 MG tablet Take 1 tablet (7.5 mg total) by mouth daily as needed.   cyanocobalamin (,VITAMIN B-12,) 1000 MCG/ML injection Inject 1,000 mcg into the muscle every 30 (thirty) days.    diltiazem (CARDIZEM CD) 180 MG 24 hr capsule Take 1 capsule (180 mg total) by mouth daily.   folic acid (FOLVITE) 1 MG tablet Take 1 tablet (1 mg total) by mouth daily.   irbesartan (AVAPRO) 300 MG tablet TAKE 1 TABLET BY MOUTH DAILY   MAGNESIUM LACTATE PO Take by mouth 2 (two) times daily.   methotrexate (RHEUMATREX) 2.5 MG tablet Take 6 tablets (15 mg total) by mouth once a week. Caution:Chemotherapy. Protect from light.   nystatin (MYCOSTATIN) 100000 UNIT/ML suspension Swish and swallow 5 mL three times daily (Patient not taking: Reported on 10/07/2021)   omeprazole (PRILOSEC) 20 MG capsule Take 1 capsule (20 mg total) by mouth 2 (two) times daily as needed.   predniSONE (DELTASONE) 5 MG tablet Take 1 tablet (5 mg total) by mouth daily with breakfast.   traMADol (ULTRAM) 50 MG tablet Take 50 mg by mouth 3 (three) times daily as needed. (Patient not taking: Reported on 10/22/2021)   triamcinolone (NASACORT) 55 MCG/ACT AERO nasal inhaler Place 2 sprays into the nose daily.   No facility-administered encounter medications on file as of  10/24/2021.   Lab Results  Component Value Date/Time   HGBA1C 5.7 (H) 05/10/2018 05:11 AM    BP Readings from Last 3 Encounters:  10/22/21 (!) 153/69  10/07/21 (!) 151/63  10/02/21 130/70   Patient contacted to review initial questions prior to visit with  Charlene Brooke.  Have you seen any other providers since your last visit with PCP? Yes  Any changes in your medications or health? Yes - Start: amitriptyline (ELAVIL) 10 MG tablet - Take 1 at bedtime.  Start: folic acid (FOLVITE) 1 MG tablet. Start: methotrexate (RHEUMATREX) 2.5 MG tablet. Change: predniSONE (DELTASONE) 5 MG tablet vs. 30 mg total daily.   Any side effects from any medications? No  Do you have an symptoms or problems not managed by your medications? Yes - Patient believes she has another UTI. Patient sent patient message to the office; patient is waiting for reply.   Any concerns about your health right now? Yes - Patient is concerned with her RA diagnoses.   Has your provider asked that you check blood pressure, blood sugar, or follow special diet at home? No, but patient does take her blood at home on her own. Patient will take her blood pressure daily until her call with Charlene Brooke and report her log.   Do you get any type of exercise on a regular basis? No  Can you think of a goal you would like to reach for your health? Yes - Patient states she is tired of getting UTIs.  She is ready to be healthy again.   Do you have any problems getting your medications? No  Is there anything that you would like to discuss during the appointment? No  Spoke with patient and reminded them to have all medications, supplements and any blood glucose and blood pressure readings available for review with pharmacist, at their telephone visit on 10/28/2021 at 11:00.    Star Rating Drugs:  Medication:  Last Fill: Day Supply Atorvastatin 10 mg 10/08/2021 90 Irbesartan 300 mg 09/09/2021 90  Care Gaps: Annual wellness visit in last year? No 04/26/2020 Most Recent BP reading: 153/69 on 10/22/2021  Charlene Brooke, CPP notified  Marijean Niemann, [place] Clinical Pharmacy Assistant [phone]  Time Spent: 29 Minutes

## 2021-10-24 NOTE — Telephone Encounter (Signed)
Pt notified of UC recommendations per PCP

## 2021-10-24 NOTE — Telephone Encounter (Signed)
No appts available not sure what I can do besides advise go to UC but will route to PCP for comments/recommendations 1st

## 2021-10-24 NOTE — Telephone Encounter (Signed)
Pt called in stated she has a possible UTI wanted to know if something can be done . Explain there in no available appointment . Requesting a call back [phone]

## 2021-10-24 NOTE — Telephone Encounter (Signed)
Best recommendation is urgent care if no one is available.  I am out of the office and will need to be off the computer until after 5

## 2021-10-27 ENCOUNTER — Ambulatory Visit (INDEPENDENT_AMBULATORY_CARE_PROVIDER_SITE_OTHER): Payer: Medicare Other | Admitting: Obstetrics and Gynecology

## 2021-10-27 ENCOUNTER — Other Ambulatory Visit: Payer: Self-pay

## 2021-10-27 ENCOUNTER — Encounter: Payer: Self-pay | Admitting: Internal Medicine

## 2021-10-27 DIAGNOSIS — R300 Dysuria: Secondary | ICD-10-CM | POA: Diagnosis not present

## 2021-10-27 DIAGNOSIS — N3001 Acute cystitis with hematuria: Secondary | ICD-10-CM

## 2021-10-27 LAB — POCT URINALYSIS DIPSTICK
Bilirubin, UA: NEGATIVE
Blood, UA: POSITIVE
Glucose, UA: NEGATIVE
Ketones, UA: NEGATIVE
Nitrite, UA: POSITIVE
Protein, UA: NEGATIVE
Spec Grav, UA: 1.01 (ref 1.010–1.025)
Urobilinogen, UA: NEGATIVE E.U./dL — AB
pH, UA: 7 (ref 5.0–8.0)

## 2021-10-27 MED ORDER — NITROFURANTOIN MONOHYD MACRO 100 MG PO CAPS: 100.0000 mg | ORAL_CAPSULE | Freq: Two times a day (BID) | ORAL | 0 refills | Status: DC

## 2021-10-28 ENCOUNTER — Ambulatory Visit: Payer: Medicare Other | Admitting: Pharmacist

## 2021-10-28 DIAGNOSIS — Z87440 Personal history of urinary (tract) infections: Secondary | ICD-10-CM

## 2021-10-28 DIAGNOSIS — M059 Rheumatoid arthritis with rheumatoid factor, unspecified: Secondary | ICD-10-CM

## 2021-10-28 DIAGNOSIS — M85859 Other specified disorders of bone density and structure, unspecified thigh: Secondary | ICD-10-CM

## 2021-10-28 DIAGNOSIS — K219 Gastro-esophageal reflux disease without esophagitis: Secondary | ICD-10-CM

## 2021-10-28 DIAGNOSIS — I480 Paroxysmal atrial fibrillation: Secondary | ICD-10-CM

## 2021-10-28 DIAGNOSIS — I700 Atherosclerosis of aorta: Secondary | ICD-10-CM

## 2021-10-28 DIAGNOSIS — I10 Essential (primary) hypertension: Secondary | ICD-10-CM

## 2021-10-28 DIAGNOSIS — E7800 Pure hypercholesterolemia, unspecified: Secondary | ICD-10-CM

## 2021-10-28 NOTE — Telephone Encounter (Signed)
She should delay starting methotrexate until after completing any antibiotics. She can continue the 5 mg prednisone daily.

## 2021-10-29 LAB — URINE CULTURE

## 2021-10-29 NOTE — Patient Instructions (Signed)
Visit Information  Phone number for Pharmacist: [phone]  Thank you for meeting with me to discuss your medications! I look forward to working with you to achieve your health care goals. Below is a summary of what we talked about during the visit:   Goals Addressed   None     Care Plan : [place]  Updates made by Charlton Haws, RPH since 10/29/2021 12:00 AM     Problem: Hypertension, Hyperlipidemia, Atrial Fibrillation, Coronary Artery Disease, GERD, Anxiety, and Osteopenia, Rheumatoid arthritis, Recurrent UTIs   Priority: High     Long-Range Goal: Disease mgmt   Start Date: 10/28/2021  Expected End Date: 10/29/2022  This Visit's Progress: On track  Priority: High  Note:   Current Barriers:  Unable to independently monitor therapeutic efficacy Unable to maintain control of recurrent UTI  Pharmacist Clinical Goal(s):  Patient will achieve adherence to monitoring guidelines and medication adherence to achieve therapeutic efficacy adhere to plan to optimize therapeutic regimen for recurrent UTI as evidenced by report of adherence to recommended medication management changes through collaboration with PharmD and provider.   Interventions: 1:1 collaboration with [name], [name], MD regarding development and update of comprehensive plan of care as evidenced by provider attestation and co-signature Inter-disciplinary care team collaboration (see longitudinal plan of care) Comprehensive medication review performed; medication list updated in electronic medical record  Hypertension (BP goal <140/90) -Controlled - pt reports BP has been higher since being on prednisone; she is planning to stop prednisone once she can start methotrexate -Current home readings: 130/60 before meds in AM; 145/70 after prednisone -Current treatment: Irbesartan 300 mg daily Diltiazem 180 mg daily -Medications previously tried: n/a  -Denies hypotensive/hypertensive symptoms -Educated  on BP goals and benefits of medications for prevention of heart attack, stroke and kidney damage; Importance of home blood pressure monitoring; -Counseled to monitor BP at home 1-2x weekly -Recommended to continue current medication  Hyperlipidemia: (LDL goal < 70) -Not ideally controlled - LDL is above goal although results are > 56 year old; current control is reasonable given patient age and lack of ASCVD events; she reports persistent cramps in hands and feet for which she takes a magnesium supplement -Hx aortic atherosclerosis (CXR 05/2021). Cardiac cath 02/2017 normal arteries. -Current treatment: Atorvastatin 10 mg daily -Educated on Cholesterol goals; Benefits of statin for ASCVD risk reduction; Importance of limiting foods high in cholesterol; -Recommended to continue current medication  Atrial Fibrillation (Goal: prevent stroke and major bleeding) -Controlled - pt reports improvement in dizzy spells since being on diltiazem; she denies bleeding issues -CHADSVASC: 6; Dx 04/2021  -Current treatment: Rate control: Diltiazem CD 180 mg daily Anticoagulation: Eliquis 5 mg BID -Counseled on increased risk of stroke due to Afib and benefits of anticoagulation for stroke prevention; importance of adherence to anticoagulant exactly as prescribed; bleeding risk associated with Eliquis and importance of self-monitoring for signs/symptoms of bleeding; -Recommended to continue current medication  Rheumatoid arthritis (Goal: manage symptoms) -Not ideally controlled - pt reports recent addition of amitriptyline has helped significantly; she has been on prednisone taper throughout November which will end when she starts Methotrexate, which is currently on hold due to antibiotics for UTI - pt has discussed this with rheumatology and will start MTX when she finished abx course in 7 days; she will start folic acid 1 mg daily -Dx 09/2021 -Current treatment  Amitriptyline 10 mg daily HS  Methotrexate 15 mg  weekly Folic acid 1 mg daily Prednisone 5 mg daily  Tramadol 50 mg TID prn Tylenol 500 mg PRN -Counseled on importance of folic acid supplementation while taking methotrexate; discussed frequent lab monitoring with MTX and multiple drug interactions; advised to contact rheumatologist with any questions/concerns regarding MTX -Advised pt to stop omeprazole and Pepto Bismol due to interactions with MTX -Recommended to continue current medication  Depression/Anxiety (Goal: manage symptoms) -Controlled - pt reports amitriptyline is mainly for pain; she does not feel depressed; she does not use clorazepate often, typically only when driving with her husband -Current treatment: Amitriptyline 10 mg HS Clorazepate 7.5 mg PRN  -PHQ9: 4 -GAD7: not on file -Connected with PCP for mental health support -Recommended to continue current medication  GERD (Goal: manage symptoms) -Not ideally controlled - pt read that omeprazole cannot be taken with methotrexate and she has stopped it; she reports occasional reflux symptoms instigated by certain foods; she has not tried other antacids -Current treatment  Omeprazole 20 mg BID -Counseled on interaction between MTX and omeprazle - advised to avoid PPIs. H2RAs do not have the same interactions -Removed omeprazole from med list -Recommend famotidine 20 mg PRN reflux  Osteopenia (Goal prevent fractures) -Controlled - DEXA scan up to date; pt endorses compliance with ca-vit D -Last DEXA Scan: 05/31/20   T-Score femoral neck: -1.1  T-Score lumbar spine: +1.2  10-year probability of major osteoporotic fracture: 11.3%  10-year probability of hip fracture: 2.5% -Patient is not a candidate for pharmacologic treatment -Current treatment  Calcium carbonate-Vitamin D  -Recommend [phone] units of vitamin D daily. Recommend 1200 mg of calcium daily from dietary and supplemental sources. Recommend weight-bearing and muscle strengthening exercises for building and  maintaining bone density.  Recurrent UTI (Goal: reduce frequency of UTI) -Not ideally controlled - pt has had at least 4 UTIs in previous 6 months; she was prescribed Estrace cream by gynecologist, this was prescribed for vaginal bleeding and stopped using it when bleeding stopped -Pt is drinking sugar-free cranberry juice daily to help with UTIs -Current treatment  Estrace cream - apply vaginally 2-3 times weekly -Discussed that estrace cream can also be used for recurrent UTIs; advised to continue using 2-3 times weekly and see if UTIs decline -Discussed lack of benefit with cranberry juice/supplements on preventing UTIs; advised to drink plenty of fluids (water)  Health Maintenance -Vaccine gaps: Shingrix, Covid booster -Pt declined additional covid vaccines -Current therapy:  Vitamin B12 1000 mcg injection q 30 days Magnesium lactate BID Nasacort nasal spray PRN - spring and fall Clobetasaol 0.05% ointment Nystatin suspension Linzess - infrequent use, 2-3 times a year Mucinex PRN Pepto-Bismol chew PRN Loperamide 2 mg PRN -Discussed drug interaction between pepto bismol (salicylates) and methotrexate, advised to avoid pepto bismol while on MTX -Recommended to continue current medication  Patient Goals/Self-Care Activities Patient will:  - take medications as prescribed as evidenced by patient report and record review focus on medication adherence by pill box check blood pressure 1-2x weekly, document, and provide at future appointments -Use estrace cream 2-3 times weekly to help with UTIs -Can stop cranberry juice; drink plain water instead -Stop omeprazole and Pepto Bismol while taking Methotrexate. Use famotidine (Pepcid) as needed for reflux      Ms. Pagnotta was given information about Chronic Care Management services today including:  CCM service includes personalized support from designated clinical staff supervised by her physician, including individualized plan of care  and coordination with other care providers 24/7 contact phone numbers for assistance for urgent and routine care needs. Standard insurance, coinsurance, copays  and deductibles apply for chronic care management only during months in which we provide at least 20 minutes of these services. Most insurances cover these services at 100%, however patients may be responsible for any copay, coinsurance and/or deductible if applicable. This service may help you avoid the need for more expensive face-to-face services. Only one practitioner may furnish and bill the service in a calendar month. The patient may stop CCM services at any time (effective at the end of the month) by phone call to the office staff.  Patient agreed to services and verbal consent obtained.   Patient verbalizes understanding of instructions provided today and agrees to view in [place].  Telephone follow up appointment with pharmacy team member scheduled for: 6 months  Charlene Brooke, PharmD, [facility] Clinical Pharmacist [facility] [phone]

## 2021-10-30 ENCOUNTER — Ambulatory Visit: Payer: Medicare Other | Admitting: Family Medicine

## 2021-11-01 ENCOUNTER — Other Ambulatory Visit: Payer: Self-pay | Admitting: Family Medicine

## 2021-11-03 NOTE — Progress Notes (Signed)
Results are c/w UTI - needs Rx: MacroBid 1 PO BID for 7 days

## 2021-11-07 ENCOUNTER — Encounter: Payer: Self-pay | Admitting: Nurse Practitioner

## 2021-11-07 ENCOUNTER — Other Ambulatory Visit: Payer: Self-pay

## 2021-11-07 ENCOUNTER — Telehealth: Payer: Self-pay | Admitting: Cardiovascular Disease

## 2021-11-07 ENCOUNTER — Ambulatory Visit (INDEPENDENT_AMBULATORY_CARE_PROVIDER_SITE_OTHER): Payer: Medicare Other | Admitting: Nurse Practitioner

## 2021-11-07 ENCOUNTER — Other Ambulatory Visit: Payer: Self-pay | Admitting: Family Medicine

## 2021-11-07 VITALS — BP 142/68 | HR 94 | Ht 64.0 in | Wt 189.0 lb

## 2021-11-07 DIAGNOSIS — I480 Paroxysmal atrial fibrillation: Secondary | ICD-10-CM

## 2021-11-07 DIAGNOSIS — M7989 Other specified soft tissue disorders: Secondary | ICD-10-CM | POA: Diagnosis not present

## 2021-11-07 DIAGNOSIS — I77810 Thoracic aortic ectasia: Secondary | ICD-10-CM | POA: Diagnosis not present

## 2021-11-07 DIAGNOSIS — E782 Mixed hyperlipidemia: Secondary | ICD-10-CM

## 2021-11-07 NOTE — Progress Notes (Signed)
Office Visit    Patient Name: Tammy Manning Date of Encounter: 11/07/2021  Primary Care Provider:  Abner Greenspan, MD Primary Cardiologist:  Kathlyn Sacramento, MD  Chief Complaint    86 year old female with a history of aortic valve insufficiency, hypertension with intolerance to multiple medications, hyperlipidemia, normal coronary arteries by catheterization in 4174, grade 1 diastolic dysfunction, mild aortic root dilatation at 39 mm, B12 deficiency, and osteoarthritis, who presents for follow-up of lower extremity swelling.  Past Medical History    Past Medical History:  Diagnosis Date   A-fib ([place])    Anginal pain ([place])    Aortic insufficiency    a. 02/2020 Echo: EF 60-65%, no rwma, Gr1 DD, nl RV fxn, mildly dil LA, triv AI, Asc Ao 62m; b. 04/2021 Echo: EF 60-65%, no rwma, GrI DD, nl RV fxn, mildly dil LA, AI not visualized. Mild-mod AoV sclerosis w/o stenosis.   Arthritis    Knees   B12 deficiency    Mild   Bucket-handle tear of lateral meniscus of left knee as current injury 12/30/2012   Cancer ([place]    Skin, basal cell on nose and eyelid   DJD (degenerative joint disease)    Low back   Dyspepsia    GERD (gastroesophageal reflux disease)    Heart murmur    Heart palpitations    History of cardiac cath    a. 02/2017 Cath: Nl cors. Nl LV fxn. Abd Aogram w/o RAS.   HOH (hard of hearing)    Hyperlipidemia    Hypertension    IBS (irritable bowel syndrome)    Lactose intolerance    Mixed incontinence    PONV (postoperative nausea and vomiting)    Vulvar irritation    from urine pad, uses Triamcinolone oint.   Past Surgical History:  Procedure Laterality Date   ABDOMINAL AORTOGRAM N/A 03/15/2017   Procedure: Abdominal Aortogram;  Surgeon: MWellington Hampshire MD;  Location[facility];  Service: Cardiovascular;  Laterality: N/A;   ABDOMINAL HYSTERECTOMY  1982   Large fibroids and bleeding   APPENDECTOMY     BILATERAL SALPINGOOPHORECTOMY  1986   BONE GRAFT  HIP ILIAC CREST     To Left arm   BREAST REDUCTION SURGERY     BREAST SURGERY     CARDIAC CATHETERIZATION     CATARACT EXTRACTION W/PHACO Left 07/07/2016   Procedure: CATARACT EXTRACTION PHACO AND INTRAOCULAR LENS PLACEMENT (IOC);  Surgeon: WBirder Robson MD;  Location: [facility];  Service: Ophthalmology;  Laterality: Left;  [place]:10 AP% 18.3 CDE 12.91 Fluid pak lot # [id number]   CATARACT EXTRACTION W/PHACO Right 07/28/2016   Procedure: CATARACT EXTRACTION PHACO AND INTRAOCULAR LENS PLACEMENT (IOC);  Surgeon: WBirder Robson MD;  Location: [facility];  Service: Ophthalmology;  Laterality: Right;  [place]01:26 AP% 18.5 CDE 16.12 Fluid pack lot # [id number]   COLONOSCOPY  07/2016   Dr. MThana Farr  DILATION AND CURETTAGE OF UTERUS     miscarragesx2   ESOPHAGOGASTRODUODENOSCOPY     Polyps   HEMORRHOID SURGERY  2006   KNEE ARTHROSCOPY WITH LATERAL MENISECTOMY Left 12/30/2012   Procedure: KNEE ARTHROSCOPY WITH LATERAL MENISECTOMY;  Surgeon: JJohnny Bridge MD;  [facility] [facility]  Service: Orthopedics;  Laterality: Left;  LEFT KNEE SCOPE LATERAL MENISCECTOMY   LEFT HEART CATH AND CORONARY ANGIOGRAPHY N/A 03/15/2017   Procedure: Left Heart Cath and Coronary Angiography;  Surgeon: MWellington Hampshire MD;  [facility];  Service: Cardiovascular;  Laterality: N/A;   MOUTH SURGERY     skin cancer eyelid  2012   TONSILLECTOMY      Allergies  Allergies  Allergen Reactions   Antihistamines, Chlorpheniramine-Type Other (See Comments)    Reaction:  Makes pt hyper    Atenolol Hypertension   Cefuroxime Axetil Other (See Comments)    Upset stomach   Ciprofloxacin Other (See Comments)    Upset stomach   Co Q 10 [Coenzyme Q10] Other (See Comments)    Reaction:  GI upset    Crestor [Rosuvastatin Calcium] Other (See Comments)    Reaction:  Myalgias    Dextromethorphan-Guaifenesin Other (See Comments)    Reaction:  Made pt jittery    Epinephrine Hives   Ramipril Other  (See Comments)    Upset stomach   Sulfamethoxazole-Trimethoprim Other (See Comments)    Upset stomach   Vitamin D Analogs Other (See Comments)    Reaction:  GI upset     History of Present Illness    86 year old female with the above past medical history including paroxysmal atrial fibrillation, aortic insufficiency, hypertension with intolerance to multiple antihypertensives, hyperlipidemia, normal coronary arteries by catheterization in 3888, grade 1 diastolic dysfunction, mild aortic root dilatation at 39 mm, B12 deficiency, and osteoarthritis.  Echocardiogram in April 2021 showed normal LV function with trivial AI, mildly dilated aortic root at 39 mm.  In June 2022, she was evaluated in the emergency department with leg edema, fatigue, and palpitations, and was found to be in atrial fibrillation with RVR.  She converted to sinus rhythm on diltiazem infusion and was noted to be mildly hypokalemic.  She was subsequently placed on oral diltiazem and Eliquis.  Home dose of chlorthalidone was discontinued.  Echocardiogram showed EF 60 to 65% with mildly dilated left atrium and mildly calcified aortic valve.  She had recurrent atrial fibrillation in mid July and again was treated with IV diltiazem with conversion to sinus rhythm.  Home dose of diltiazem was increased to 240 mg daily however, this was subsequently reduced back to 180 mg daily in mid August secondary to concerns about side effects and drops in blood pressure.  Ms. Lobb felt well at her follow-up visit on September 13.  She has chronic left knee pain and swelling and with this, has chronic, mild left lower extremity swelling.  Over the past few days, she has had some discomfort in her anterior left lower leg associated with mild swelling and bulging of veins in the anterior leg.  She does have varicosities in this leg.  She has no edema on exam today.  She is planning on going away for the holidays to [place] and just wants to be  sure that there are no issues before leaving town.  She denies chest pain, dyspnea, palpitations, PND, orthopnea, dizziness, syncope, or early satiety  Home Medications    Current Outpatient Medications  Medication Sig Dispense Refill   acetaminophen (TYLENOL) 500 MG tablet Take 500 mg by mouth every 6 (six) hours as needed.     amitriptyline (ELAVIL) 10 MG tablet TAKE ONE TABLET BY MOUTH AT BEDTIME 30 tablet 1   apixaban (ELIQUIS) 5 MG TABS tablet Take 1 tablet (5 mg total) by mouth 2 (two) times daily. 180 tablet 1   atorvastatin (LIPITOR) 10 MG tablet TAKE 1 TABLET BY MOUTH EVERY DAY AT 6 PM 90 tablet 1   Calcium Carbonate-Vitamin D (CALCIUM 600+D PO) Take 1 tablet by mouth daily.      clobetasol  ointment (TEMOVATE) 6.56 % Apply 1 application topically 2 (two) times daily as needed.     clorazepate (TRANXENE) 7.5 MG tablet Take 1 tablet (7.5 mg total) by mouth daily as needed. 30 tablet 1   cyanocobalamin (,VITAMIN B-12,) 1000 MCG/ML injection Inject 1,000 mcg into the muscle every 30 (thirty) days.      folic acid (FOLVITE) 1 MG tablet Take 1 tablet (1 mg total) by mouth daily. 90 tablet 0   guaiFENesin (MUCINEX) 600 MG 12 hr tablet Take 600 mg by mouth 2 (two) times daily as needed.     irbesartan (AVAPRO) 300 MG tablet TAKE 1 TABLET BY MOUTH DAILY 90 tablet 3   loperamide (IMODIUM) 2 MG capsule Take 2 mg by mouth as needed for diarrhea or loose stools.     MAGNESIUM LACTATE PO Take by mouth 2 (two) times daily.     methotrexate (RHEUMATREX) 2.5 MG tablet Take 6 tablets (15 mg total) by mouth once a week. Caution:Chemotherapy. Protect from light. 30 tablet 0   predniSONE (DELTASONE) 5 MG tablet Take 1 tablet (5 mg total) by mouth daily with breakfast. 30 tablet 0   triamcinolone (NASACORT) 55 MCG/ACT AERO nasal inhaler Place 2 sprays into the nose daily. 1 Inhaler 12   diltiazem (CARDIZEM CD) 180 MG 24 hr capsule Take 1 capsule (180 mg total) by mouth daily. 90 capsule 3   No current  facility-administered medications for this visit.     Review of Systems    Mild left lower extremity swelling recently with varicose veins.  She denies chest pain, dyspnea, palpitations, PND, orthopnea, dizziness, syncope, or early satiety.  All other systems reviewed and are otherwise negative except as noted above.    Physical Exam    VS:  BP 140/60 (BP Location: Left Arm, Patient Position: Sitting, Cuff Size: Normal)    Pulse 94    Ht 5' 4"  (1.626 m)    Wt 189 lb (85.7 kg)    SpO2 98%    BMI 32.44 kg/m  , BMI Body mass index is 32.44 kg/m.     Vitals:   11/07/21 1415 11/07/21 1729  BP: 140/60 (!) 142/68  Pulse: 94   SpO2: 98%     GEN: Well nourished, well developed, in no acute distress. HEENT: normal. Neck: Supple, no JVD, carotid bruits, or masses. Cardiac: RRR, no murmurs, rubs, or gallops. No clubbing, cyanosis, edema.  Varicosities palpated over the left lower leg.  Radials/DP/PT 2+ and equal bilaterally.  Respiratory:  Respirations regular and unlabored, clear to auscultation bilaterally. GI: Soft, nontender, nondistended, BS + x 4. MS: no deformity or atrophy. Skin: warm and dry, no rash. Neuro:  Strength and sensation are intact. Psych: Normal affect.  Accessory Clinical Findings    ECG personally reviewed by me today - Regular sinus rhythm, 94, leftward axis, LVH - no acute changes.  Lab Results  Component Value Date   WBC 10.3 10/07/2021   HGB 12.1 10/07/2021   HCT 37.0 10/07/2021   MCV 93.4 10/07/2021   PLT 467 (H) 10/07/2021   Lab Results  Component Value Date   CREATININE 0.75 10/07/2021   BUN 20 10/07/2021   NA 137 10/07/2021   K 4.4 10/07/2021   CL 100 10/07/2021   CO2 27 10/07/2021   Lab Results  Component Value Date   ALT 7 10/07/2021   AST 12 10/07/2021   ALKPHOS 58 06/08/2021   BILITOT 0.5 10/07/2021   Lab Results  Component Value Date  CHOL 201 (H) 04/25/2020   HDL 66.80 04/25/2020   LDLCALC 107 (H) 04/25/2020   LDLDIRECT 106 (H)  02/02/2020   TRIG 132.0 04/25/2020   CHOLHDL 3 04/25/2020    Lab Results  Component Value Date   HGBA1C 5.7 (H) 05/10/2018    Assessment & Plan    1.  Left lower extremity swelling: Patient with chronic left knee discomfort and swelling as well as mild left ankle swelling, who recently has been having some discomfort over the anterior left ankle associated with mild swelling and bulging of varicose veins.  On exam today, she has no edema or tenderness.  No palpable cords.  Negative Homans (also on chronic Eliquis).  We discussed her symptoms and reassurance offered.  I recommended that she keep her legs elevated, reduce sodium, and use compression to help minimize left lower extremity swelling.  2.  Essential hypertension: Blood pressure elevated today at 470 and 929 systolic on repeat.  She thinks it trends better than this at home.  I encouraged her to watch her sodium intake and also continue to follow her pressures at home.  Could consider going back up on diltiazem to 240 mg daily if necessary.  She will contact us in a few weeks with blood pressure trends.  3.  Paroxysmal atrial fibrillation: Quiescent on diltiazem therapy.  She is anticoagulated with Eliquis.  4.  Hyperlipidemia: Remains on low-dose atorvastatin therapy.  She did not tolerate higher doses previously.  5.  Dilated aortic root: 39 mm by echo in April 2021 with plan to follow-up echo in 2023.  6.  Disposition: She will keep planned follow-up with Dr. Fletcher Anon in January.   Murray Hodgkins, NP 11/07/2021, 2:28 PM

## 2021-11-07 NOTE — Telephone Encounter (Signed)
Patient is coming in today to see provider regarding her concerns.

## 2021-11-07 NOTE — Patient Instructions (Signed)
Medication Instructions:  No changes at this time.  *If you need a refill on your cardiac medications before your next appointment, please call your pharmacy*   Lab Work: None  If you have labs (blood work) drawn today and your tests are completely normal, you will receive your results only by: [place] (if you have MyChart) OR A paper copy in the mail If you have any lab test that is abnormal or we need to change your treatment, we will call you to review the results.   Testing/Procedures: None   Follow-Up: At [facility], you and your health needs are our priority.  As part of our continuing mission to provide you with exceptional heart care, we have created designated Provider Care Teams.  These Care Teams include your primary Cardiologist (physician) and Advanced Practice Providers (APPs -  Physician Assistants and Nurse Practitioners) who all work together to provide you with the care you need, when you need it.   Your next appointment:   Keep scheduled appointment 12/09/21 at 1:40 pm  The format for your next appointment:   In Person  Provider:   Kathlyn Sacramento, MD

## 2021-11-07 NOTE — Telephone Encounter (Signed)
Patient calling to have eval of LLE  before travelling for the holidays. .    C/o pain above ankle on the front of leg up to the shin and notices intermittent vein popping out . Patient states increased pain when ambulating .  She does notice intermittent redness. Denies numbness or tingling.  Denies bruising, streaking or fever at site last 3 days increasing .  Patient applying warm wet compresses to area .  Scheduled opening with berge 220 pm  today

## 2021-11-10 MED ORDER — NITROFURANTOIN MONOHYD MACRO 100 MG PO CAPS: 100.0000 mg | ORAL_CAPSULE | Freq: Two times a day (BID) | ORAL | 0 refills | Status: DC

## 2021-11-11 ENCOUNTER — Other Ambulatory Visit: Payer: Self-pay

## 2021-11-11 ENCOUNTER — Ambulatory Visit (INDEPENDENT_AMBULATORY_CARE_PROVIDER_SITE_OTHER): Payer: Medicare Other | Admitting: Obstetrics and Gynecology

## 2021-11-11 VITALS — Temp 98.4°F

## 2021-11-11 DIAGNOSIS — N390 Urinary tract infection, site not specified: Secondary | ICD-10-CM

## 2021-11-11 LAB — POCT URINALYSIS DIPSTICK
Bilirubin, UA: NEGATIVE
Glucose, UA: NEGATIVE
Ketones, UA: NEGATIVE
Nitrite, UA: NEGATIVE
Protein, UA: NEGATIVE
Spec Grav, UA: 1.02 (ref 1.010–1.025)
Urobilinogen, UA: 0.2 E.U./dL
pH, UA: 5 (ref 5.0–8.0)

## 2021-11-11 NOTE — Progress Notes (Signed)
Patient presents for urine dipstick. Patient states complaints of vaginal cramping, cloudy urine and increased frequency.

## 2021-11-12 ENCOUNTER — Other Ambulatory Visit (HOSPITAL_COMMUNITY): Payer: Medicare Other

## 2021-11-12 ENCOUNTER — Encounter: Payer: Self-pay | Admitting: Internal Medicine

## 2021-11-19 LAB — URINE CULTURE

## 2021-11-20 ENCOUNTER — Ambulatory Visit: Payer: Medicare Other

## 2021-11-25 ENCOUNTER — Ambulatory Visit: Admit: 2021-11-25 | Payer: Medicare Other | Admitting: Orthopedic Surgery

## 2021-11-25 ENCOUNTER — Encounter: Payer: Self-pay | Admitting: Family Medicine

## 2021-11-25 DIAGNOSIS — N390 Urinary tract infection, site not specified: Secondary | ICD-10-CM | POA: Insufficient documentation

## 2021-11-25 SURGERY — ARTHROPLASTY, KNEE, TOTAL
Anesthesia: Choice | Site: Knee | Laterality: Left

## 2021-11-27 ENCOUNTER — Encounter: Payer: Self-pay | Admitting: Internal Medicine

## 2021-11-30 NOTE — Progress Notes (Signed)
Office Visit Note  Patient: Tammy Manning             Date of Birth: 09-May-1935           MRN: [id number]             PCP: Abner Greenspan, MD Referring: [name], [name], MD Visit Date: 12/01/2021   Subjective:  Follow-up (UTI)   History of Present Illness: Tammy Manning is a 87 y.o. female here for follow up for seropositive RA started prednisone and methotrexate after last visit but discontinued due to development of another UTI. Her joint pain is not as bad as before starting treatments last year but does have some ongoing pain and stiffness. She notices some GI upset with the methotrexate and folic acid so is interested in alternatives. She met with her urologist earlier today to discuss plan she may or may not be taking more prolonged antibiotics due to the recurrent recent UTIs.   Previous HPI 10/22/21 Tammy Manning is a 87 y.o. female here for follow up for seropositive rheumatoid arthritis after last visit had severely worse joint pain and swelling started prednisone taper down to 10 mg daily. Labs showed highly positive inflammatory markers baseline labs for medication monitoring okay. She has had very large improvement on prednisone beginning about 1 day from start. During the interval she had MRI of the cervical spine showing some multilevel degenerative appearing changes for which she has orthopedics follow up later today.  Previous HPI 11/25/20 Tammy Manning is a 87 y.o. female with a history of afib, TIA, and OA here for evaluation of joint pain and positive rheumatoid factor. She has joint pain in multiple sites including both hands but also has a lot of pain with the right shoulder that started more acutely with suspected rotator cuff tear. 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 She does notice swelling in hands intermittently. She has morning stiffness lasting less than 30 minutes daily also persistent pain  throughout the day. She has had left radius fracture with surgical repair but no other major surgery of her arms.   Labs reviewed RF 26   Review of Systems  Constitutional:  Negative for fatigue.  HENT:  Positive for mouth dryness.   Eyes:  Positive for dryness.  Respiratory:  Negative for shortness of breath.   Cardiovascular:  Positive for swelling in legs/feet.  Gastrointestinal:  Negative for constipation.  Endocrine: Positive for cold intolerance, heat intolerance, excessive thirst and increased urination.  Genitourinary:  Positive for difficulty urinating and painful urination.  Musculoskeletal:  Positive for joint pain, joint pain, joint swelling, muscle weakness, morning stiffness and muscle tenderness.  Skin:  Negative for rash.  Allergic/Immunologic: Positive for susceptible to infections.  Neurological:  Negative for numbness.  Hematological:  Positive for bruising/bleeding tendency.  Psychiatric/Behavioral:  Positive for sleep disturbance.    PMFS History:  Patient Active Problem List   Diagnosis Date Noted   Recurrent UTI 11/25/2021   High risk medication use 10/07/2021   Blood urine 06/12/2021   Vaginal bleeding 06/12/2021   Atrial fibrillation with RVR ([place]) 04/28/2021   Hypertrophic toenail 04/22/2021   Cough 03/24/2021   Pedal edema 03/24/2021   Leg pain, left 03/24/2021   Seropositive rheumatoid arthritis ([place]) 11/25/2020   Bilateral hand pain 11/25/2020   Bilateral shoulder pain 10/16/2020   Joint pain 10/16/2020   Joint swelling 10/16/2020   Estrogen deficiency 04/30/2020  Low back pain 03/15/2020   Dry mouth 03/15/2020   Osteoarthritis of right hip 01/02/2020   Groin pain, right 01/01/2020   Trochanteric bursitis of right hip 11/09/2018   Transient global amnesia 05/12/2018   Right thyroid nodule 05/12/2018   History of TIA (transient ischemic attack) 05/09/2018   Aortic insufficiency 09/10/2015   Stress reaction 09/08/2015   Encounter for  Medicare annual wellness exam 02/28/2013   Bucket-handle tear of lateral meniscus of left knee as current injury 12/30/2012   Episodic atrial fibrillation ([place]) 11/17/2012   Special screening for malignant neoplasms, colon 01/21/2012   Hearing loss of both ears 01/14/2012   HEADACHE, CHRONIC 09/24/2010   B12 deficiency 03/17/2010   GERD (gastroesophageal reflux disease) 03/17/2010   POSTMENOPAUSAL STATUS 08/20/2008   Essential hypertension 07/19/2007   Hyperlipidemia 06/24/2007   CONSTIPATION 06/24/2007   IBS 06/24/2007   DERMATITIS, ATOPIC 06/24/2007   SKIN CANCER, HX OF 06/24/2007    Past Medical History:  Diagnosis Date   A-fib ([place])    Anginal pain ([place])    Aortic insufficiency    a. 02/2020 Echo: EF 60-65%, no rwma, Gr1 DD, nl RV fxn, mildly dil LA, triv AI, Asc Ao 75m; b. 04/2021 Echo: EF 60-65%, no rwma, GrI DD, nl RV fxn, mildly dil LA, AI not visualized. Mild-mod AoV sclerosis w/o stenosis.   Arthritis    Knees   B12 deficiency    Mild   Bucket-handle tear of lateral meniscus of left knee as current injury 12/30/2012   Cancer ([place]    Skin, basal cell on nose and eyelid   DJD (degenerative joint disease)    Low back   Dyspepsia    GERD (gastroesophageal reflux disease)    Heart murmur    Heart palpitations    History of cardiac cath    a. 02/2017 Cath: Nl cors. Nl LV fxn. Abd Aogram w/o RAS.   HOH (hard of hearing)    Hyperlipidemia    Hypertension    IBS (irritable bowel syndrome)    Lactose intolerance    Mixed incontinence    PONV (postoperative nausea and vomiting)    Vulvar irritation    from urine pad, uses Triamcinolone oint.    Family History  Problem Relation Age of Onset   Cancer Mother        Colon   Heart disease Father        CAD   Diabetes Sister    Hypothyroidism Sister    Diabetes Brother    Leukemia Brother    Esophageal cancer Brother    Cancer Paternal Aunt        Breast   Diabetes Sister    Alzheimer's disease Sister    Diabetes  Brother    Breast cancer Daughter    Past Surgical History:  Procedure Laterality Date   ABDOMINAL AORTOGRAM N/A 03/15/2017   Procedure: Abdominal Aortogram;  Surgeon: MWellington Hampshire MD;  [facility];  Service: Cardiovascular;  Laterality: N/A;   ABDOMINAL HYSTERECTOMY  1982   Large fibroids and bleeding   APPENDECTOMY     BILATERAL SALPINGOOPHORECTOMY  1986   BONE GRAFT HIP ILIAC CREST     To Left arm   BREAST REDUCTION SURGERY     BREAST SURGERY     CARDIAC CATHETERIZATION     CATARACT EXTRACTION W/PHACO Left 07/07/2016   Procedure: CATARACT EXTRACTION PHACO AND INTRAOCULAR LENS PLACEMENT (IOC);  Surgeon: WBirder Robson MD;  Location: [facility];  Service:  Ophthalmology;  Laterality: Left;  [place] 01:10 AP% 18.3 CDE 12.91 Fluid pak lot # [id number]   CATARACT EXTRACTION W/PHACO Right 07/28/2016   Procedure: CATARACT EXTRACTION PHACO AND INTRAOCULAR LENS PLACEMENT (IOC);  Surgeon: Birder Robson, MD;  Location: [facility];  Service: Ophthalmology;  Laterality: Right;  [place] 01:26 AP% 18.5 CDE 16.12 Fluid pack lot # [id number]   COLONOSCOPY  07/2016   Dr. Thana Farr   DILATION AND CURETTAGE OF UTERUS     miscarragesx2   ESOPHAGOGASTRODUODENOSCOPY     Polyps   HEMORRHOID SURGERY  2006   KNEE ARTHROSCOPY WITH LATERAL MENISECTOMY Left 12/30/2012   Procedure: KNEE ARTHROSCOPY WITH LATERAL MENISECTOMY;  Surgeon: Johnny Bridge, MD;  Location: [facility];  Service: Orthopedics;  Laterality: Left;  LEFT KNEE SCOPE LATERAL MENISCECTOMY   LEFT HEART CATH AND CORONARY ANGIOGRAPHY N/A 03/15/2017   Procedure: Left Heart Cath and Coronary Angiography;  Surgeon: Wellington Hampshire, MD;  Location: [facility];  Service: Cardiovascular;  Laterality: N/A;   MOUTH SURGERY     skin cancer eyelid  2012   TONSILLECTOMY     Social History   Social History Narrative   Metallurgist.     Immunization History  Administered Date(s) Administered   Fluad Quad(high Dose 65+)  10/16/2020   Influenza Split 07/24/2011, 08/23/2014   Influenza Whole 08/23/2004, 07/24/2009, 08/06/2010, 08/05/2012   Influenza, High Dose Seasonal PF 08/11/2016, 08/27/2017, 08/02/2018, 09/05/2021   Influenza,inj,Quad PF,6+ Mos 08/16/2013, 08/07/2015   Influenza-Unspecified 09/05/2021   PFIZER(Purple Top)SARS-COV-2 Vaccination 12/15/2019, 01/05/2020, 10/25/2020   Pneumococcal Conjugate-13 08/24/2014   Pneumococcal Polysaccharide-23 08/20/2008   Td 03/20/2003, 02/28/2013   Zoster, Live 09/04/2015     Objective: Vital Signs: BP 136/61 (BP Location: Right Arm, Patient Position: Sitting, Cuff Size: Normal)    Pulse 93    Resp 16    Ht 5' 4.5" (1.638 m)    Wt 189 lb (85.7 kg)    BMI 31.94 kg/m    Physical Exam Cardiovascular:     Rate and Rhythm: Normal rate and regular rhythm.  Pulmonary:     Effort: Pulmonary effort is normal.     Breath sounds: Normal breath sounds.  Skin:    General: Skin is warm and dry.     Findings: No rash.  Neurological:     Mental Status: She is alert.     Musculoskeletal Exam:  Neck full ROM no tenderness Shoulders ROM full with some pain on abduction active and passive Elbows full ROM no tenderness or swelling Wrists full ROM no tenderness or swelling Fingers full ROM, tenderness at [facility] bilaterally, no swelling and no focal tenderness or synovitis in other digits Knees decreased extension b/l no palpable effusion  CDAI Exam: CDAI Score: 7  Patient Global: 20 mm; Provider Global: 30 mm Swollen: 0 ; Tender: 4  Joint Exam 12/01/2021      Right  Left  Glenohumeral   Tender   Tender  CMC   Tender   Tender     Investigation: No additional findings.  Imaging: No results found.  Recent Labs: Lab Results  Component Value Date   WBC 10.3 10/07/2021   HGB 12.1 10/07/2021   PLT 467 (H) 10/07/2021   NA 137 10/07/2021   K 4.4 10/07/2021   CL 100 10/07/2021   CO2 27 10/07/2021   GLUCOSE 86 10/07/2021   BUN 20 10/07/2021   CREATININE 0.75  10/07/2021   BILITOT 0.5 10/07/2021   ALKPHOS 58 06/08/2021  AST 12 10/07/2021   ALT 7 10/07/2021   PROT 6.6 10/07/2021   ALBUMIN 3.9 06/08/2021   CALCIUM 9.7 10/07/2021   GFRAA 71 02/02/2020    Speciality Comments: No specialty comments available.  Procedures:  No procedures performed Allergies: Antihistamines, chlorpheniramine-type; Atenolol; Cefuroxime axetil; Ciprofloxacin; Co q 10 [coenzyme q10]; Crestor [rosuvastatin calcium]; Dextromethorphan-guaifenesin; Epinephrine; Ramipril; Sulfamethoxazole-trimethoprim; and Vitamin d analogs   Assessment / Plan:     Visit Diagnoses: Seropositive rheumatoid arthritis ([place]) - Plan: methotrexate 50 MG/2ML injection, TUBERCULIN SYR 1CC/26GX3/8" 26G X 3/8" 1 ML MISC  Still partially improved compared to before treatment, not as well as last visit where symptoms were completely controlled with prednisone. She did not tolerate the oral medication very well with some GI intolerance. Will switch to methotrexate 15 mg [place] weekly for this.Her recurrent UTI issues seem to predate any RA treatment significantly so I don't think it is related to her medication, and I don't think going on prophylactic antibiotics would be a contraindication to resuming treatment.  High risk medication use  Not checking labs today as she is off treatment, will plan for f/u in 4 weeks after restart with switch to [facility] methotrexate for monitoring.  Orders: No orders of the defined types were placed in this encounter.  Meds ordered this encounter  Medications   methotrexate 50 MG/2ML injection    Sig: Inject 0.6 mLs (15 mg total) into the skin once a week.    Dispense:  4 mL    Refill:  0   TUBERCULIN SYR 1CC/26GX3/8" 26G X 3/8" 1 ML MISC    Sig: For injection of subcutaneous methotrexate once weekly    Dispense:  25 each    Refill:  0    Pharmacy may substitute for equivalent volume syringes and equipment eg needles as needed     Follow-Up Instructions: Return in  about 4 weeks (around 12/29/2021) for RA switching MTX to [place] f/u 4wks.   Collier Salina, MD  Note - This record has been created using [employer].  Chart creation errors have been sought, but may not always  have been located. Such creation errors do not reflect on  the standard of medical care.

## 2021-12-01 ENCOUNTER — Encounter: Payer: Self-pay | Admitting: Internal Medicine

## 2021-12-01 ENCOUNTER — Encounter: Payer: Self-pay | Admitting: Urology

## 2021-12-01 ENCOUNTER — Ambulatory Visit (INDEPENDENT_AMBULATORY_CARE_PROVIDER_SITE_OTHER): Payer: Medicare Other | Admitting: Internal Medicine

## 2021-12-01 ENCOUNTER — Ambulatory Visit (INDEPENDENT_AMBULATORY_CARE_PROVIDER_SITE_OTHER): Payer: Medicare Other | Admitting: Urology

## 2021-12-01 ENCOUNTER — Other Ambulatory Visit: Payer: Self-pay

## 2021-12-01 VITALS — BP 145/72 | HR 99 | Ht 64.0 in | Wt 185.0 lb

## 2021-12-01 VITALS — BP 136/61 | HR 93 | Resp 16 | Ht 64.5 in | Wt 189.0 lb

## 2021-12-01 DIAGNOSIS — Z79899 Other long term (current) drug therapy: Secondary | ICD-10-CM | POA: Diagnosis not present

## 2021-12-01 DIAGNOSIS — M059 Rheumatoid arthritis with rheumatoid factor, unspecified: Secondary | ICD-10-CM

## 2021-12-01 DIAGNOSIS — N390 Urinary tract infection, site not specified: Secondary | ICD-10-CM

## 2021-12-01 DIAGNOSIS — M2540 Effusion, unspecified joint: Secondary | ICD-10-CM | POA: Diagnosis not present

## 2021-12-01 LAB — BLADDER SCAN AMB NON-IMAGING: Scan Result: 0

## 2021-12-01 MED ORDER — "TUBERCULIN SYRINGE 26G X 3/8"" 1 ML MISC": 0 refills | Status: DC

## 2021-12-01 MED ORDER — METHOTREXATE SODIUM CHEMO INJECTION 50 MG/2ML: 15.0000 mg | INTRAMUSCULAR | 0 refills | Status: DC

## 2021-12-01 NOTE — Progress Notes (Signed)
12/01/2021 8:17 AM   Tammy Manning Jul 08, 1935 [id number]  Referring provider: Abner Greenspan, MD [address] [place],  [place] [address]  Chief Complaint  Patient presents with   Recurrent UTI    HPI: Tammy Manning is an 87 y.o. female referred for evaluation of recurrent UTIs.  Seen by Dr. Glori Manning 06/12/2021 complaining of vaginal spotting Had no bothersome lower urinary tract symptoms though a urine dipstick showed 3+ leukocytes and urine culture grew Pseudomonas and she was treated with cephalexin Refer to Dr. Amalia Manning for vaginal bleeding which was felt to be secondary to atrophic vaginitis and anticoagulant therapy.  Estrace cream was recommended which she subsequently discontinued after her vaginal bleeding resolved She had positive urine cultures for E. coli August 2022, November 2022, early December 2022 and late December 2022 all treated with nitrofurantoin She complains of significant pain at the end of urination with suprapubic pressure which has resolved with antibiotic therapy however her symptoms have returned within 5-6 days of completing antibiotic therapy No febrile UTI Denies gross hematuria   PMH: Past Medical History:  Diagnosis Date   A-fib ([place])    Anginal pain ([place])    Aortic insufficiency    a. 02/2020 Echo: EF 60-65%, no rwma, Gr1 DD, nl RV fxn, mildly dil LA, triv AI, Asc Ao 69m; b. 04/2021 Echo: EF 60-65%, no rwma, GrI DD, nl RV fxn, mildly dil LA, AI not visualized. Mild-mod AoV sclerosis w/o stenosis.   Arthritis    Knees   B12 deficiency    Mild   Bucket-handle tear of lateral meniscus of left knee as current injury 12/30/2012   Cancer ([place]    Skin, basal cell on nose and eyelid   DJD (degenerative joint disease)    Low back   Dyspepsia    GERD (gastroesophageal reflux disease)    Heart murmur    Heart palpitations    History of cardiac cath    a. 02/2017 Cath: Nl cors. Nl LV fxn. Abd Aogram w/o RAS.   HOH (hard of hearing)     Hyperlipidemia    Hypertension    IBS (irritable bowel syndrome)    Lactose intolerance    Mixed incontinence    PONV (postoperative nausea and vomiting)    Vulvar irritation    from urine pad, uses Triamcinolone oint.    Surgical History: Past Surgical History:  Procedure Laterality Date   ABDOMINAL AORTOGRAM N/A 03/15/2017   Procedure: Abdominal Aortogram;  Surgeon: MWellington Hampshire MD;  [facility];  Service: Cardiovascular;  Laterality: N/A;   ABDOMINAL HYSTERECTOMY  1982   Large fibroids and bleeding   APPENDECTOMY     BILATERAL SALPINGOOPHORECTOMY  1986   BONE GRAFT HIP ILIAC CREST     To Left arm   BREAST REDUCTION SURGERY     BREAST SURGERY     CARDIAC CATHETERIZATION     CATARACT EXTRACTION W/PHACO Left 07/07/2016   Procedure: CATARACT EXTRACTION PHACO AND INTRAOCULAR LENS PLACEMENT (IOC);  Surgeon: WBirder Robson MD;  Location: [facility];  Service: Ophthalmology;  Laterality: Left;  [place]:10 AP% 18.3 CDE 12.91 Fluid pak lot # [id number]   CATARACT EXTRACTION W/PHACO Right 07/28/2016   Procedure: CATARACT EXTRACTION PHACO AND INTRAOCULAR LENS PLACEMENT (IOC);  Surgeon: WBirder Robson MD;  Location: [facility];  Service: Ophthalmology;  Laterality: Right;  [place]01:26 AP% 18.5 CDE 16.12 Fluid pack lot # [id number]   COLONOSCOPY  07/2016   Dr. MThana Farr  DILATION AND CURETTAGE OF UTERUS     miscarragesx2   ESOPHAGOGASTRODUODENOSCOPY     Polyps   HEMORRHOID SURGERY  2006   KNEE ARTHROSCOPY WITH LATERAL MENISECTOMY Left 12/30/2012   Procedure: KNEE ARTHROSCOPY WITH LATERAL MENISECTOMY;  Surgeon: Johnny Bridge, MD;  Location: [place] [facility];  Service: Orthopedics;  Laterality: Left;  LEFT KNEE SCOPE LATERAL MENISCECTOMY   LEFT HEART CATH AND CORONARY ANGIOGRAPHY N/A 03/15/2017   Procedure: Left Heart Cath and Coronary Angiography;  Surgeon: Wellington Hampshire, MD;  Location: [facility];  Service: Cardiovascular;  Laterality: N/A;   MOUTH  SURGERY     skin cancer eyelid  2012   TONSILLECTOMY      Home Medications:  Allergies as of 12/01/2021       Reactions   Antihistamines, Chlorpheniramine-type Other (See Comments)   Reaction:  Makes pt hyper    Atenolol Hypertension   Cefuroxime Axetil Other (See Comments)   Upset stomach   Ciprofloxacin Other (See Comments)   Upset stomach   Co Q 10 [coenzyme Q10] Other (See Comments)   Reaction:  GI upset    Crestor [rosuvastatin Calcium] Other (See Comments)   Reaction:  Myalgias   Dextromethorphan-guaifenesin Other (See Comments)   Reaction:  Made pt jittery    Epinephrine Hives   Ramipril Other (See Comments)   Upset stomach   Sulfamethoxazole-trimethoprim Other (See Comments)   Upset stomach   Vitamin D Analogs Other (See Comments)   Reaction:  GI upset         Medication List        Accurate as of December 01, 2021  8:17 AM. If you have any questions, ask your nurse or doctor.          acetaminophen 500 MG tablet Commonly known as: TYLENOL Take 500 mg by mouth every 6 (six) hours as needed.   amitriptyline 10 MG tablet Commonly known as: ELAVIL TAKE ONE TABLET BY MOUTH AT BEDTIME   atorvastatin 10 MG tablet Commonly known as: LIPITOR TAKE 1 TABLET BY MOUTH EVERY DAY AT 6 PM   CALCIUM 600+D PO Take 1 tablet by mouth daily.   clobetasol ointment 0.05 % Commonly known as: TEMOVATE Apply 1 application topically 2 (two) times daily as needed.   clorazepate 7.5 MG tablet Commonly known as: TRANXENE Take 1 tablet (7.5 mg total) by mouth daily as needed.   cyanocobalamin 1000 MCG/ML injection Commonly known as: (VITAMIN B-12) Inject 1,000 mcg into the muscle every 30 (thirty) days.   diltiazem 180 MG 24 hr capsule Commonly known as: CARDIZEM CD Take 1 capsule (180 mg total) by mouth daily.   Eliquis 5 MG Tabs tablet Generic drug: apixaban TAKE 1 TABLET(5 MG) BY MOUTH TWICE DAILY   folic acid 1 MG tablet Commonly known as: FOLVITE Take 1  tablet (1 mg total) by mouth daily.   guaiFENesin 600 MG 12 hr tablet Commonly known as: MUCINEX Take 600 mg by mouth 2 (two) times daily as needed.   irbesartan 300 MG tablet Commonly known as: AVAPRO TAKE 1 TABLET BY MOUTH DAILY   loperamide 2 MG capsule Commonly known as: IMODIUM Take 2 mg by mouth as needed for diarrhea or loose stools.   MAGNESIUM LACTATE PO Take by mouth 2 (two) times daily.   nitrofurantoin (macrocrystal-monohydrate) 100 MG capsule Commonly known as: MACROBID Take 1 capsule (100 mg total) by mouth 2 (two) times daily.   triamcinolone 55 MCG/ACT Aero nasal inhaler Commonly  known as: NASACORT Place 2 sprays into the nose daily.        Allergies:  Allergies  Allergen Reactions   Antihistamines, Chlorpheniramine-Type Other (See Comments)    Reaction:  Makes pt hyper    Atenolol Hypertension   Cefuroxime Axetil Other (See Comments)    Upset stomach   Ciprofloxacin Other (See Comments)    Upset stomach   Co Q 10 [Coenzyme Q10] Other (See Comments)    Reaction:  GI upset    Crestor [Rosuvastatin Calcium] Other (See Comments)    Reaction:  Myalgias    Dextromethorphan-Guaifenesin Other (See Comments)    Reaction:  Made pt jittery    Epinephrine Hives   Ramipril Other (See Comments)    Upset stomach   Sulfamethoxazole-Trimethoprim Other (See Comments)    Upset stomach   Vitamin D Analogs Other (See Comments)    Reaction:  GI upset     Family History: Family History  Problem Relation Age of Onset   Cancer Mother        Colon   Heart disease Father        CAD   Diabetes Sister    Hypothyroidism Sister    Diabetes Brother    Leukemia Brother    Esophageal cancer Brother    Cancer Paternal Aunt        Breast   Diabetes Sister    Alzheimer's disease Sister    Diabetes Brother    Breast cancer Daughter     Social History:  reports that she quit smoking about 58 years ago. Her smoking use included cigarettes. She has a 12.00 pack-year  smoking history. She has never used smokeless tobacco. She reports that she does not drink alcohol and does not use drugs.   Physical Exam: There were no vitals taken for this visit.  Constitutional:  Alert and oriented, No acute distress. HEENT: [place] AT, moist mucus membranes.  Trachea midline, no masses. Cardiovascular: No clubbing, cyanosis, or edema. Respiratory: Normal respiratory effort, no increased work of breathing. Psychiatric: Normal mood and affect.  Laboratory Data:  Urinalysis Appearance-orange, cloudy Dipstick-1+ blood/3+ leukocytes/nitrite + Microscopy-3-10 RBC/>30 WBC/many bacteria   Assessment & Plan:    1. Recurrent UTI We discussed the evaluation and treatment of patients with recurrent UTIs at length.  We specifically discussed the differences between asymptomatic bacteriuria and true urinary tract infection.  We discussed the AUA definition of recurrent UTI of at least 2 culture proven symptomatic acute cystitis episodes in a 16-monthperiod, or 3 within a 1 year period.  We discussed the importance of culture directed antibiotic treatment, and antibiotic stewardship.   Possible etiologies of recurrent infection in a postmenopausal woman were discussed.  Finally, we discussed the role of perineal hygiene, timed voiding, adequate hydration, topical vaginal estrogen, cranberry prophylaxis, and low-dose antibiotic prophylaxis. I recommended she restart low-dose vaginal estrogen applying a small dab of Estrace to the vaginal introitus twice weekly She just started taking a cranberry supplement and would recommend adding the-mannose Would also recommend a short 394-monthourse of low-dose antibiotic prophylaxis due to the rapid recurrence of her infections however before prescribing will await her urinalysis results today.  If she does have recurrent infection we will need to treat first before starting low-dose prophylaxis   ScAbbie SonsMD  BuNew [place] Creek[address]SuBucyrusuKane[place] [phone]

## 2021-12-01 NOTE — Patient Instructions (Signed)
We can switch methotrexate to injectable form this decreases gastrointestinal side effects risk. Your dose will be 0.6 mL once weekly. We will need to follow up in a month about rechecking labs to make sure no problems.

## 2021-12-01 NOTE — Patient Instructions (Signed)
Take over the counter cranberry and D-Mannose tablets daily.

## 2021-12-03 LAB — URINALYSIS, COMPLETE
Bilirubin, UA: NEGATIVE
Ketones, UA: NEGATIVE
Nitrite, UA: POSITIVE — AB
Specific Gravity, UA: <1.005 — ABNORMAL LOW (ref 1.005–1.030)
Urobilinogen, Ur: 0.2 mg/dL (ref 0.2–1.0)
pH, UA: 5.5 (ref 5.0–7.5)

## 2021-12-03 LAB — MICROSCOPIC EXAMINATION: WBC, UA: >30 /hpf — AB (ref 0–5)

## 2021-12-05 ENCOUNTER — Other Ambulatory Visit: Payer: Self-pay | Admitting: *Deleted

## 2021-12-05 ENCOUNTER — Telehealth: Payer: Self-pay | Admitting: *Deleted

## 2021-12-05 LAB — CULTURE, URINE COMPREHENSIVE

## 2021-12-05 MED ORDER — TRIMETHOPRIM 100 MG PO TABS: 100.0000 mg | ORAL_TABLET | Freq: Every day | ORAL | 2 refills | Status: DC

## 2021-12-05 MED ORDER — DOXYCYCLINE MONOHYDRATE 100 MG PO CAPS
100.0000 mg | ORAL_CAPSULE | Freq: Two times a day (BID) | ORAL | 0 refills | Status: AC
Start: 2021-12-05 — End: 2021-12-12

## 2021-12-05 NOTE — Telephone Encounter (Signed)
Received call from [facility] regarding Trimethoprim and Methotrexate interaction causes a major interaction and can cause bone marrow suppression. Will not fill RX. Please advise.

## 2021-12-05 NOTE — Telephone Encounter (Signed)
-----   Message from Abbie Sons, MD sent at 12/05/2021  7:04 AM EST ----- Urine culture was positive.  Please send Rx doxycycline monohydrate 100 mg twice daily x7 days and Rx trimethoprim 100 mg daily #30/refill x2.  She is to start the trimethoprim after she completes the course of doxycycline

## 2021-12-05 NOTE — Telephone Encounter (Signed)
Notified patient as instructed, patient pleased °

## 2021-12-07 ENCOUNTER — Encounter: Payer: Self-pay | Admitting: Internal Medicine

## 2021-12-09 ENCOUNTER — Other Ambulatory Visit: Payer: Self-pay

## 2021-12-09 ENCOUNTER — Ambulatory Visit (INDEPENDENT_AMBULATORY_CARE_PROVIDER_SITE_OTHER): Payer: Medicare Other | Admitting: Cardiovascular Disease

## 2021-12-09 ENCOUNTER — Encounter: Payer: Self-pay | Admitting: Cardiovascular Disease

## 2021-12-09 VITALS — BP 128/64 | HR 86 | Ht 64.5 in | Wt 189.2 lb

## 2021-12-09 DIAGNOSIS — E785 Hyperlipidemia, unspecified: Secondary | ICD-10-CM

## 2021-12-09 DIAGNOSIS — I480 Paroxysmal atrial fibrillation: Secondary | ICD-10-CM | POA: Diagnosis not present

## 2021-12-09 DIAGNOSIS — I10 Essential (primary) hypertension: Secondary | ICD-10-CM | POA: Diagnosis not present

## 2021-12-09 MED ORDER — APIXABAN 5 MG PO TABS: ORAL_TABLET | ORAL | 1 refills | Status: DC

## 2021-12-09 NOTE — Progress Notes (Signed)
Cardiology Office Note   Date:  12/09/2021   ID:  [name], DOB 06-Jan-1935, MRN [id number]  PCP:  Abner Greenspan, MD  Cardiologist:  Kathlyn Sacramento, MD   Chief Complaint  Patient presents with   Other    4 wk f/u no complaints today. Meds reviewed verbally with pt.       History of Present Illness: Tammy Manning is a 87 y.o. female who presents for a follow-up visit regarding atrial fibrillation and aortic insufficiency.   She has intolerance to multiple antihypertensive medications.  Cardiac catheterization in April 2018 showed normal coronary arteries and normal LV systolic function.  Abdominal aortogram showed no evidence of renal artery stenosis.    Previous echocardiogram in June 2019 showed an EF of 55 - 60%, mild LVH and mild aortic insufficiency.    She was hospitalized in June of 2022 for atrial fibrillation.  She took an extra dose of chlorthalidone for leg edema and presented with increased fatigue and palpitations.  She was noted to be in atrial fibrillation with RVR.  She was started on diltiazem drip and converted to sinus rhythm.  She was noted to be mildly hypokalemic. The patient was switched from chlorthalidone to diltiazem.  CHA2DS2-VASc score was 4 and thus she was started on anticoagulation with Eliquis 5 mg twice daily.  Echocardiogram was done which showed an EF of 60 to 65%, mildly dilated left atrium with mildly calcified aortic valve.  She was seen in our office in December for increased lower extremity edema which was noted to be mild overall and did not require investigation.  She had a recent UTI that improved with antibiotics.  She is doing well at the present time with no chest pain or shortness of breath.  She reports mild palpitations overall mostly at night.  These usually do not last long.  No side effects with anticoagulation.   Past Medical History:  Diagnosis Date   A-fib ([place])    Anginal pain ([place])    Aortic insufficiency     a. 02/2020 Echo: EF 60-65%, no rwma, Gr1 DD, nl RV fxn, mildly dil LA, triv AI, Asc Ao 21m; b. 04/2021 Echo: EF 60-65%, no rwma, GrI DD, nl RV fxn, mildly dil LA, AI not visualized. Mild-mod AoV sclerosis w/o stenosis.   Arthritis    Knees   B12 deficiency    Mild   Bucket-handle tear of lateral meniscus of left knee as current injury 12/30/2012   Cancer ([place]    Skin, basal cell on nose and eyelid   DJD (degenerative joint disease)    Low back   Dyspepsia    GERD (gastroesophageal reflux disease)    Heart murmur    Heart palpitations    History of cardiac cath    a. 02/2017 Cath: Nl cors. Nl LV fxn. Abd Aogram w/o RAS.   HOH (hard of hearing)    Hyperlipidemia    Hypertension    IBS (irritable bowel syndrome)    Lactose intolerance    Mixed incontinence    PONV (postoperative nausea and vomiting)    Vulvar irritation    from urine pad, uses Triamcinolone oint.    Past Surgical History:  Procedure Laterality Date   ABDOMINAL AORTOGRAM N/A 03/15/2017   Procedure: Abdominal Aortogram;  Surgeon: MWellington Hampshire MD;  [facility];  Service: Cardiovascular;  Laterality: N/A;   ABDOMINAL HYSTERECTOMY  1982   Large fibroids and bleeding   APPENDECTOMY  BILATERAL SALPINGOOPHORECTOMY  1986   BONE GRAFT HIP ILIAC CREST     To Left arm   BREAST REDUCTION SURGERY     BREAST SURGERY     CARDIAC CATHETERIZATION     CATARACT EXTRACTION W/PHACO Left 07/07/2016   Procedure: CATARACT EXTRACTION PHACO AND INTRAOCULAR LENS PLACEMENT ([place]);  Surgeon: Birder Robson, MD;  Location: [facility];  Service: Ophthalmology;  Laterality: Left;  [place] 01:10 AP% 18.3 CDE 12.91 Fluid pak lot # [id number]   CATARACT EXTRACTION W/PHACO Right 07/28/2016   Procedure: CATARACT EXTRACTION PHACO AND INTRAOCULAR LENS PLACEMENT ([facility]);  Surgeon: Birder Robson, MD;  Location: [facility];  Service: Ophthalmology;  Laterality: Right;  [place] 01:26 AP% 18.5 CDE 16.12 Fluid pack lot # [id number]    COLONOSCOPY  07/2016   Dr. Thana Farr   DILATION AND CURETTAGE OF UTERUS     miscarragesx2   ESOPHAGOGASTRODUODENOSCOPY     Polyps   HEMORRHOID SURGERY  2006   KNEE ARTHROSCOPY WITH LATERAL MENISECTOMY Left 12/30/2012   Procedure: KNEE ARTHROSCOPY WITH LATERAL MENISECTOMY;  Surgeon: Johnny Bridge, MD;  Location: [facility];  Service: Orthopedics;  Laterality: Left;  LEFT KNEE SCOPE LATERAL MENISCECTOMY   LEFT HEART CATH AND CORONARY ANGIOGRAPHY N/A 03/15/2017   Procedure: Left Heart Cath and Coronary Angiography;  Surgeon: Wellington Hampshire, MD;  Location: [facility];  Service: Cardiovascular;  Laterality: N/A;   MOUTH SURGERY     skin cancer eyelid  2012   TONSILLECTOMY       Current Outpatient Medications  Medication Sig Dispense Refill   acetaminophen (TYLENOL) 500 MG tablet Take 500 mg by mouth every 6 (six) hours as needed.     amitriptyline (ELAVIL) 10 MG tablet TAKE ONE TABLET BY MOUTH AT BEDTIME 30 tablet 1   atorvastatin (LIPITOR) 10 MG tablet TAKE 1 TABLET BY MOUTH EVERY DAY AT 6 PM 90 tablet 1   Calcium Carbonate-Vitamin D (CALCIUM 600+D PO) Take 1 tablet by mouth daily.      clobetasol ointment (TEMOVATE) 5.10 % Apply 1 application topically 2 (two) times daily as needed.     clorazepate (TRANXENE) 7.5 MG tablet Take 1 tablet (7.5 mg total) by mouth daily as needed. 30 tablet 1   cyanocobalamin (,VITAMIN B-12,) 1000 MCG/ML injection Inject 1,000 mcg into the muscle every 30 (thirty) days.      doxycycline (MONODOX) 100 MG capsule Take 1 capsule (100 mg total) by mouth 2 (two) times daily for 7 days. 14 capsule 0   ELIQUIS 5 MG TABS tablet TAKE 1 TABLET(5 MG) BY MOUTH TWICE DAILY 180 tablet 1   guaiFENesin (MUCINEX) 600 MG 12 hr tablet Take 600 mg by mouth 2 (two) times daily as needed.     irbesartan (AVAPRO) 300 MG tablet TAKE 1 TABLET BY MOUTH DAILY 90 tablet 3   loperamide (IMODIUM) 2 MG capsule Take 2 mg by mouth as needed for diarrhea or loose stools.      MAGNESIUM LACTATE PO Take by mouth 2 (two) times daily.     methotrexate 50 MG/2ML injection Inject 0.6 mLs (15 mg total) into the skin once a week. 4 mL 0   triamcinolone (NASACORT) 55 MCG/ACT AERO nasal inhaler Place 2 sprays into the nose daily. 1 Inhaler 12   trimethoprim (TRIMPEX) 100 MG tablet Take 1 tablet (100 mg total) by mouth daily. 30 tablet 2   TUBERCULIN SYR 1CC/26GX3/8" 26G X 3/8" 1 ML MISC For injection of subcutaneous methotrexate once weekly  25 each 0   diltiazem (CARDIZEM CD) 180 MG 24 hr capsule Take 1 capsule (180 mg total) by mouth daily. 90 capsule 3   No current facility-administered medications for this visit.    Allergies:   Antihistamines, chlorpheniramine-type; Atenolol; Cefuroxime axetil; Ciprofloxacin; Co q 10 [coenzyme q10]; Crestor [rosuvastatin calcium]; Dextromethorphan-guaifenesin; Epinephrine; Ramipril; Sulfamethoxazole-trimethoprim; and Vitamin d analogs    Social History:  The patient  reports that she quit smoking about 58 years ago. Her smoking use included cigarettes. She has a 12.00 pack-year smoking history. She has never used smokeless tobacco. She reports that she does not drink alcohol and does not use drugs.   Family History:  The patient's family history includes Alzheimer's disease in her sister; Breast cancer in her daughter; Cancer in her mother and paternal aunt; Diabetes in her brother, brother, sister, and sister; Esophageal cancer in her brother; Heart disease in her father; Hypothyroidism in her sister; Leukemia in her brother.    ROS:  Please see the history of present illness.   Otherwise, review of systems are positive for none.   All other systems are reviewed and negative.    PHYSICAL EXAM: VS:  BP 128/64 (BP Location: Left Arm, Patient Position: Sitting, Cuff Size: Normal)    Pulse 86    Ht 5' 4.5" (1.638 m)    Wt 189 lb 4 oz (85.8 kg)    SpO2 95%    BMI 31.98 kg/m  , BMI Body mass index is 31.98 kg/m. GEN: Well nourished,  well developed, in no acute distress  HEENT: normal  Neck: no JVD, carotid bruits, or masses Cardiac: RRR; no  rubs, or gallops,no edema, 1/6 systolic murmur in the aortic area. Respiratory:  clear to auscultation bilaterally, normal work of breathing GI: soft, nontender, nondistended, + BS MS: no deformity or atrophy  Skin: warm and dry, no rash Neuro:  Strength and sensation are intact Psych: euthymic mood, full affect   EKG:  EKG is ordered today. The ekg ordered today demonstrates normal sinus rhythm with no significant ST or T wave changes.  Minimal LVH.   Recent Labs: 03/20/2021: B Natriuretic Peptide 72.4 04/28/2021: Magnesium 1.7 10/07/2021: ALT 7; BUN 20; Creat 0.75; Hemoglobin 12.1; Platelets 467; Potassium 4.4; Sodium 137    Lipid Panel    Component Value Date/Time   CHOL 201 (H) 04/25/2020 0901   CHOL 196 02/02/2020 1348   TRIG 132.0 04/25/2020 0901   HDL 66.80 04/25/2020 0901   HDL 80 02/02/2020 1348   CHOLHDL 3 04/25/2020 0901   VLDL 26.4 04/25/2020 0901   LDLCALC 107 (H) 04/25/2020 0901   LDLCALC 93 02/02/2020 1348   LDLDIRECT 106 (H) 02/02/2020 1348   LDLDIRECT 105.9 02/08/2013 1050      Wt Readings from Last 3 Encounters:  12/09/21 189 lb 4 oz (85.8 kg)  12/01/21 189 lb (85.7 kg)  12/01/21 185 lb (83.9 kg)       No flowsheet data found.    ASSESSMENT AND PLAN:   1.  Paroxysmal atrial fibrillation: She has mild palpitations at the present time with no prolonged tachycardia.  Continue diltiazem at the current dose.  Continue anticoagulation with Eliquis 5 mg twice daily.  I reviewed her recent labs done in November which showed normal renal function and normal CBC.    2. Essential hypertension:   Blood pressure is well controlled on diltiazem and irbesartan.  3. Hyperlipidemia: Currently she is on atorvastatin 10 mg daily.  Most recent lipid profile showed  an LDL of 93.    Disposition:   FU with me in 6 months  Signed, Kathlyn Sacramento,  MD 12/09/21 [place], [place]

## 2021-12-09 NOTE — Addendum Note (Signed)
Addended by: Abbie Sons on: 12/09/2021 07:37 PM   Modules accepted: Orders

## 2021-12-09 NOTE — Telephone Encounter (Signed)
Prescription refill request for Eliquis received. Indication: PAF Last office visit: 12/09/21  Rod Can MD Scr: 0.75 on 10/07/21 Age: 87 Weight: 85.8kg  Based on above findings Eliquis 21m twice daily is the appropriate dose.  Refill approved.

## 2021-12-09 NOTE — Patient Instructions (Signed)
Medication Instructions:  Your physician recommends that you continue on your current medications as directed. Please refer to the Current Medication list given to you today.  *If you need a refill on your cardiac medications before your next appointment, please call your pharmacy*   Lab Work: None ordered  If you have labs (blood work) drawn today and your tests are completely normal, you will receive your results only by: [place] (if you have MyChart) OR A paper copy in the mail If you have any lab test that is abnormal or we need to change your treatment, we will call you to review the results.   Testing/Procedures: None ordered   Follow-Up: At [facility], you and your health needs are our priority.  As part of our continuing mission to provide you with exceptional heart care, we have created designated Provider Care Teams.  These Care Teams include your primary Cardiologist (physician) and Advanced Practice Providers (APPs -  Physician Assistants and Nurse Practitioners) who all work together to provide you with the care you need, when you need it.  We recommend signing up for the patient portal called "MyChart".  Sign up information is provided on this After Visit Summary.  MyChart is used to connect with patients for Virtual Visits (Telemedicine).  Patients are able to view lab/test results, encounter notes, upcoming appointments, etc.  Non-urgent messages can be sent to your provider as well.   To learn more about what you can do with MyChart, go to [email or web address].    Your next appointment:   Your physician wants you to follow-up in: 6 months You will receive a reminder letter in the mail two months in advance. If you don't receive a letter, please call our office to schedule the follow-up appointment.   The format for your next appointment:   In Person  Provider:   You may see Kathlyn Sacramento, MD or one of the following Advanced Practice Providers on your  designated Care Team:   Murray Hodgkins, NP [name], PA-C Cadence Kathlen Mody, PA-C{    Other Instructions N/A

## 2021-12-09 NOTE — Telephone Encounter (Signed)
Please send Rx cephalexin 500 mg daily for low-dose suppression.  Start after she completes her current dose of doxycycline.  #30/2 refills

## 2021-12-10 ENCOUNTER — Other Ambulatory Visit: Payer: Self-pay | Admitting: *Deleted

## 2021-12-10 MED ORDER — CEPHALEXIN 500 MG PO CAPS: 500.0000 mg | ORAL_CAPSULE | Freq: Every day | ORAL | 2 refills | Status: DC

## 2021-12-10 NOTE — Telephone Encounter (Signed)
Notified patient as instructed, patient pleased °

## 2021-12-11 ENCOUNTER — Other Ambulatory Visit: Payer: Self-pay

## 2021-12-11 ENCOUNTER — Ambulatory Visit (INDEPENDENT_AMBULATORY_CARE_PROVIDER_SITE_OTHER): Payer: Medicare Other

## 2021-12-11 DIAGNOSIS — E538 Deficiency of other specified B group vitamins: Secondary | ICD-10-CM | POA: Diagnosis not present

## 2021-12-11 MED ORDER — CYANOCOBALAMIN 1000 MCG/ML IJ SOLN
1000.0000 ug | Freq: Once | INTRAMUSCULAR | Status: AC
  Administered 2021-12-11: 1000 ug via INTRAMUSCULAR

## 2021-12-11 NOTE — Progress Notes (Signed)
Per orders of Dr. Silvio Pate in leu of Dr. Marliss Coots absence, an injection of B12 was given by Ophelia Shoulder, CMA in the right deltoid. Patient tolerated injection well.

## 2021-12-15 ENCOUNTER — Telehealth: Payer: Self-pay | Admitting: Urology

## 2021-12-15 NOTE — Telephone Encounter (Signed)
Pt is having bad indigestion off of this medication and she said it's supposed to be low dosage, but it's 543ms. She would like a call back in regards to this.

## 2021-12-16 ENCOUNTER — Other Ambulatory Visit: Payer: Self-pay | Admitting: *Deleted

## 2021-12-16 MED ORDER — NITROFURANTOIN MONOHYD MACRO 100 MG PO CAPS: 100.0000 mg | ORAL_CAPSULE | Freq: Every day | ORAL | 0 refills | Status: DC

## 2021-12-16 NOTE — Telephone Encounter (Signed)
We can send a prescription for Macrobid 100 mg daily for 90 days to her pharmacy of choice. Per shannon .  Notified patient as instructed, patient pleased.

## 2021-12-22 ENCOUNTER — Ambulatory Visit: Payer: Medicare Other | Admitting: Internal Medicine

## 2021-12-22 ENCOUNTER — Telehealth: Payer: Self-pay

## 2021-12-22 NOTE — Telephone Encounter (Signed)
I spoke with MS. Wenkle, recommend that should be fine, patients with RA on methotrexate often have injections for joint pain without increased risk. She has planned follow up with [place] next week for her RA treatment.

## 2021-12-22 NOTE — Progress Notes (Signed)
Subjective:   Tammy Manning is a 87 y.o. female who presents for Medicare Annual (Subsequent) preventive examination.  I connected with Tammy Manning today by telephone and verified that I am speaking with the correct person using two identifiers. Location patient: home Location provider: work Persons participating in the virtual visit: patient, Marine scientist.    I discussed the limitations, risks, security and privacy concerns of performing an evaluation and management service by telephone and the availability of in person appointments. I also discussed with the patient that there may be a patient responsible charge related to this service. The patient expressed understanding and verbally consented to this telephonic visit.    Interactive audio and video telecommunications were attempted between this provider and patient, however failed, due to patient having technical difficulties OR patient did not have access to video capability.  We continued and completed visit with audio only.  Some vital signs may be absent or patient reported.   Time Spent with patient on telephone encounter: 25 minutes  Review of Systems     Cardiac Risk Factors include: advanced age (>22mn, >>49women);hypertension;dyslipidemia     Objective:    Today's Vitals   12/24/21 1314  Weight: 184 lb (83.5 kg)  Height: 5' 4"  (1.626 m)  PainSc: 3    Body mass index is 31.58 kg/m.  Advanced Directives 12/24/2021 06/08/2021 04/28/2021 03/20/2021 06/02/2020 04/26/2020 04/25/2019  Does Patient Have a Medical Advance Directive? Yes No No Yes No Yes Yes  Type of Advance Directive Living will - - Living will - [place] Living will  Does patient want to make changes to medical advance directive? Yes (MAU/Ambulatory/Procedural Areas - Information given) - - - - - -  Copy of Healthcare Power of Attorney in Chart? - - - - - No - copy requested -  Would patient like information on creating a medical  advance directive? - - - - No - Patient declined - No - Patient declined    Current Medications (verified) Outpatient Encounter Medications as of 12/24/2021  Medication Sig   acetaminophen (TYLENOL) 500 MG tablet Take 500 mg by mouth every 6 (six) hours as needed.   amitriptyline (ELAVIL) 10 MG tablet TAKE ONE TABLET BY MOUTH AT BEDTIME   apixaban (ELIQUIS) 5 MG TABS tablet TAKE 1 TABLET(5 MG) BY MOUTH TWICE DAILY   atorvastatin (LIPITOR) 10 MG tablet TAKE 1 TABLET BY MOUTH EVERY DAY AT 6 PM   Calcium Carbonate-Vitamin D (CALCIUM 600+D PO) Take 1 tablet by mouth daily.    clobetasol ointment (TEMOVATE) 00.62% Apply 1 application topically 2 (two) times daily as needed.   clorazepate (TRANXENE) 7.5 MG tablet Take 1 tablet (7.5 mg total) by mouth daily as needed.   cyanocobalamin (,VITAMIN B-12,) 1000 MCG/ML injection Inject 1,000 mcg into the muscle every 30 (thirty) days.    guaiFENesin (MUCINEX) 600 MG 12 hr tablet Take 600 mg by mouth 2 (two) times daily as needed.   irbesartan (AVAPRO) 300 MG tablet TAKE 1 TABLET BY MOUTH DAILY   loperamide (IMODIUM) 2 MG capsule Take 2 mg by mouth as needed for diarrhea or loose stools.   MAGNESIUM LACTATE PO Take by mouth 2 (two) times daily.   methotrexate 50 MG/2ML injection Inject 0.6 mLs (15 mg total) into the skin once a week.   nitrofurantoin, macrocrystal-monohydrate, (MACROBID) 100 MG capsule Take 1 capsule (100 mg total) by mouth daily.   triamcinolone (NASACORT) 55 MCG/ACT AERO nasal inhaler Place 2 sprays  into the nose daily.   TUBERCULIN SYR 1CC/26GX3/8" 26G X 3/8" 1 ML MISC For injection of subcutaneous methotrexate once weekly   cephALEXin (KEFLEX) 500 MG capsule Take 1 capsule (500 mg total) by mouth daily. (Patient not taking: Reported on 12/24/2021)   diltiazem (CARDIZEM CD) 180 MG 24 hr capsule Take 1 capsule (180 mg total) by mouth daily.   No facility-administered encounter medications on file as of 12/24/2021.    Allergies  (verified) Antihistamines, chlorpheniramine-type; Atenolol; Cefuroxime axetil; Ciprofloxacin; Co q 10 [coenzyme q10]; Crestor [rosuvastatin calcium]; Dextromethorphan-guaifenesin; Epinephrine; Ramipril; Sulfamethoxazole-trimethoprim; and Vitamin d analogs   History: Past Medical History:  Diagnosis Date   A-fib ([place])    Anginal pain ([place])    Aortic insufficiency    a. 02/2020 Echo: EF 60-65%, no rwma, Gr1 DD, nl RV fxn, mildly dil LA, triv AI, Asc Ao 46m; b. 04/2021 Echo: EF 60-65%, no rwma, GrI DD, nl RV fxn, mildly dil LA, AI not visualized. Mild-mod AoV sclerosis w/o stenosis.   Arthritis    Knees   B12 deficiency    Mild   Bucket-handle tear of lateral meniscus of left knee as current injury 12/30/2012   [place]    Skin, basal cell on nose and eyelid   DJD (degenerative joint disease)    Low back   Dyspepsia    GERD (gastroesophageal reflux disease)    Heart murmur    Heart palpitations    History of cardiac cath    a. 02/2017 Cath: Nl cors. Nl LV fxn. Abd Aogram w/o RAS.   HOH (hard of hearing)    Hyperlipidemia    Hypertension    IBS (irritable bowel syndrome)    Lactose intolerance    Mixed incontinence    PONV (postoperative nausea and vomiting)    Vulvar irritation    from urine pad, uses Triamcinolone oint.   Past Surgical History:  Procedure Laterality Date   ABDOMINAL AORTOGRAM N/A 03/15/2017   Procedure: Abdominal Aortogram;  Surgeon: MWellington Hampshire MD;  [facility];  Service: Cardiovascular;  Laterality: N/A;   ABDOMINAL HYSTERECTOMY  1982   Large fibroids and bleeding   APPENDECTOMY     BILATERAL SALPINGOOPHORECTOMY  1986   BONE GRAFT HIP ILIAC CREST     To Left arm   BREAST REDUCTION SURGERY     BREAST SURGERY     CARDIAC CATHETERIZATION     CATARACT EXTRACTION W/PHACO Left 07/07/2016   Procedure: CATARACT EXTRACTION PHACO AND INTRAOCULAR LENS PLACEMENT (IOC);  Surgeon: WBirder Robson MD;  Location: [facility];  Service:  Ophthalmology;  Laterality: Left;  [place]01:10 AP% 18.3 CDE 12.91 Fluid pak lot # [id number]   CATARACT EXTRACTION W/PHACO Right 07/28/2016   Procedure: CATARACT EXTRACTION PHACO AND INTRAOCULAR LENS PLACEMENT (IOC);  Surgeon: WBirder Robson MD;  Location: [facility];  Service: Ophthalmology;  Laterality: Right;  [place]01:26 AP% 18.5 CDE 16.12 Fluid pack lot # [id number]   COLONOSCOPY  07/2016   Dr. MThana Farr  DILATION AND CURETTAGE OF UTERUS     miscarragesx2   ESOPHAGOGASTRODUODENOSCOPY     Polyps   HEMORRHOID SURGERY  2006   KNEE ARTHROSCOPY WITH LATERAL MENISECTOMY Left 12/30/2012   Procedure: KNEE ARTHROSCOPY WITH LATERAL MENISECTOMY;  Surgeon: JJohnny Bridge MD;  [place] [facility]  Service: Orthopedics;  Laterality: Left;  LEFT KNEE SCOPE LATERAL MENISCECTOMY   LEFT HEART CATH AND CORONARY ANGIOGRAPHY N/A 03/15/2017   Procedure: Left Heart Cath and Coronary  Angiography;  Surgeon: Wellington Hampshire, MD;  Location: [facility];  Service: Cardiovascular;  Laterality: N/A;   MOUTH SURGERY     skin cancer eyelid  2012   TONSILLECTOMY     Family History  Problem Relation Age of Onset   Cancer Mother        Colon   Heart disease Father        CAD   Diabetes Sister    Hypothyroidism Sister    Diabetes Brother    Leukemia Brother    Esophageal cancer Brother    Cancer Paternal Aunt        Breast   Diabetes Sister    Alzheimer's disease Sister    Diabetes Brother    Breast cancer Daughter    Social History   Socioeconomic History   Marital status: Married    Spouse name: Not on file   Number of children: 2   Years of education: Not on file   Highest education level: Not on file  Occupational History   Occupation: Architect: RETIRED  Tobacco Use   Smoking status: Former    Packs/day: 1.00    Years: 12.00    Pack years: 12.00    Types: Cigarettes    Quit date: 11/24/1963    Years since quitting: 58.1   Smokeless tobacco: Never   Vaping Use   Vaping Use: Never used  Substance and Sexual Activity   Alcohol use: No    Alcohol/week: 0.0 standard drinks   Drug use: No   Sexual activity: Yes  Other Topics Concern   Not on file  Social History Narrative   Metallurgist.     Social Determinants of Health   Financial Resource Strain: Low Risk    Difficulty of Paying Living Expenses: Not hard at all  Food Insecurity: No Food Insecurity   Worried About Charity fundraiser in the Last Year: Never true   [place] in the Last Year: Never true  Transportation Needs: No Transportation Needs   Lack of Transportation (Medical): No   Lack of Transportation (Non-Medical): No  Physical Activity: Insufficiently Active   Days of Exercise per Week: 7 days   Minutes of Exercise per Session: 10 min  Stress: No Stress Concern Present   Feeling of Stress : Only a little  Social Connections: Moderately Isolated   Frequency of Communication with Friends and Family: More than three times a week   Frequency of Social Gatherings with Friends and Family: More than three times a week   Attends Religious Services: Never   Marine scientist or Organizations: No   Attends Music therapist: Never   Marital Status: Married    Tobacco Counseling Counseling given: Not Answered   Clinical Intake:  Pre-visit preparation completed: Yes  Pain : 0-10 Pain Score: 3  Pain Type: Chronic pain Pain Descriptors / Indicators: Other (Comment) (arthritis)     BMI - recorded: 31.58 Nutritional Status: BMI > 30  Obese Nutritional Risks: None Diabetes: No  How often do you need to have someone help you when you read instructions, pamphlets, or other written materials from your doctor or pharmacy?: 1 - Never  Diabetic? No  Interpreter Needed?: No  Information entered by :: Cleotis Lema LPN   Activities of Daily Living In your present state of health, do you have any difficulty performing the following  activities: 12/24/2021 04/29/2021  Hearing? Tempie Donning  Comment wears hearing aids -  Vision? N N  Difficulty concentrating or making decisions? Y N  Comment trouble remembering a word -  Walking or climbing stairs? Y N  Comment patient states having a bad a knee -  Dressing or bathing? N N  Doing errands, shopping? N N  Preparing Food and eating ? N -  Using the Toilet? N -  In the past six months, have you accidently leaked urine? Y -  Comment due to UTI -  Do you have problems with loss of bowel control? N -  Managing your Medications? N -  Managing your Finances? N -  Housekeeping or managing your Housekeeping? N -  Some recent data might be hidden    Patient Care Team: [name], [name], MD as PCP - General Wellington Hampshire, MD as PCP - Cardiology (Cardiology) Susa Day, MD (Orthopedic Surgery) Richmond Campbell, MD (Gastroenterology) Wellington Hampshire, MD as Consulting Physician (Cardiology) Birder Robson, MD as Referring Physician (Ophthalmology) Charlton Haws, [facility] as Pharmacist (Pharmacist)  Indicate any recent Medical Services you may have received from other than [facility] providers in the past year (date may be approximate).     Assessment:   This is a routine wellness examination for [place].  Hearing/Vision screen Hearing Screening - Comments:: Wears hearing aids Vision Screening - Comments:: Last exam 2 years ago, wears glasses for reading, plans to make an appointment. [facility]   Dietary issues and exercise activities discussed: Current Exercise Habits: Home exercise routine, Type of exercise: walking, Time (Minutes): 10, Frequency (Times/Week): 7, Weekly Exercise (Minutes/Week): 70, Intensity: Mild   Goals Addressed             This Visit's Progress    Patient Stated       Would like to relieve pain with Methotrexate       Depression Screen PHQ 2/9 Scores 12/24/2021 06/12/2021 04/26/2020 04/25/2019 04/21/2018 04/16/2017 10/21/2016  PHQ - 2 Score 0 0  0 0 0 0 0  PHQ- 9 Score - 4 0 0 0 - -  Exception Documentation - - - - - - -    Fall Risk Fall Risk  12/24/2021 06/12/2021 04/26/2020 04/25/2019 04/21/2018  Falls in the past year? 0 0 0 0 No  Number falls in past yr: 0 0 0 - -  Injury with Fall? 0 0 0 - -  Risk for fall due to : - - Medication side effect - -  Follow up Falls prevention discussed - Falls evaluation completed;Falls prevention discussed - -    FALL RISK PREVENTION PERTAINING TO THE HOME:  Any stairs in or around the home? No  If so, are there any without handrails? No  Home free of loose throw rugs in walkways, pet beds, electrical cords, etc? Yes  Adequate lighting in your home to reduce risk of falls? Yes   ASSISTIVE DEVICES UTILIZED TO PREVENT FALLS:  Life alert? No  Use of a cane, walker or w/c? No  Grab bars in the bathroom? Yes  Shower chair or bench in shower? Yes  Elevated toilet seat or a handicapped toilet? Yes   TIMED UP AND GO:  Was the test performed? No .    Cognitive Function: Normal cognitive status assessed by this Nurse Health Advisor. No abnormalities found.   MMSE - Mini Mental State Exam 04/26/2020 04/25/2019 04/21/2018 04/16/2017 04/14/2016  Orientation to time 5 5 5 5 5   Orientation to Place 5 5 5 5  5  Registration 3 3 3 3 3   Attention/ Calculation 5 0 0 0 0  Recall 3 2 3 3 3   Recall-comments - unable to recall 1 of 3 words - - -  Language- name 2 objects - 0 0 0 0  Language- repeat 1 1 1 1 1   Language- follow 3 step command - 0 3 3 3   Language- read & follow direction - 0 0 0 0  Write a sentence - 0 0 0 0  Copy design - 0 0 0 0  Total score - 16 20 20 20      6CIT Screen 12/24/2021  What Year? 0 points  What month? 0 points  What time? 0 points  Count back from 20 0 points  Months in reverse 0 points  Repeat phrase 0 points  Total Score 0    Immunizations Immunization History  Administered Date(s) Administered   Fluad Quad(high Dose 65+) 10/16/2020   Influenza Split 07/24/2011,  08/23/2014   Influenza Whole 08/23/2004, 07/24/2009, 08/06/2010, 08/05/2012   Influenza, High Dose Seasonal PF 08/11/2016, 08/27/2017, 08/02/2018, 09/05/2021   Influenza,inj,Quad PF,6+ Mos 08/16/2013, 08/07/2015   Influenza-Unspecified 09/05/2021   PFIZER(Purple Top)SARS-COV-2 Vaccination 12/15/2019, 01/05/2020, 10/25/2020   Pneumococcal Conjugate-13 08/24/2014   Pneumococcal Polysaccharide-23 08/20/2008   Td 03/20/2003, 02/28/2013   Zoster, Live 09/04/2015    TDAP status: Up to date  Flu Vaccine status: Up to date  Pneumococcal vaccine status: Up to date  Covid-19 vaccine status: Declined, Education has been provided regarding the importance of this vaccine but patient still declined. Advised may receive this vaccine at local pharmacy or Health Dept.or vaccine clinic. Aware to provide a copy of the vaccination record if obtained from local pharmacy or Health Dept. Verbalized acceptance and understanding.  Qualifies for Shingles Vaccine? Yes   Zostavax completed Yes   Shingrix Completed?: No.    Education has been provided regarding the importance of this vaccine. Patient has been advised to call insurance company to determine out of pocket expense if they have not yet received this vaccine. Advised may also receive vaccine at local pharmacy or Health Dept. Verbalized acceptance and understanding.  Screening Tests Health Maintenance  Topic Date Due   Zoster Vaccines- Shingrix (1 of 2) Never done   COVID-19 Vaccine (4 - Booster for Pfizer series) 12/20/2020   MAMMOGRAM  01/14/2022   TETANUS/TDAP  03/01/2023   Pneumonia Vaccine 85+ Years old  Completed   INFLUENZA VACCINE  Completed   DEXA SCAN  Completed   HPV VACCINES  Aged Out    Health Maintenance  Health Maintenance Due  Topic Date Due   Zoster Vaccines- Shingrix (1 of 2) Never done   COVID-19 Vaccine (4 - Booster for Pfizer series) 12/20/2020    Colorectal cancer screening: No longer required.   Mammogram status:  Completed 01/14/21. Repeat every year  Bone Density status: Completed 05/29/20. Results reflect: Bone density results: OSTEOPOROSIS. Repeat every 2 years.  Lung Cancer Screening: (Low Dose CT Chest recommended if Age 82-80 years, 30 pack-year currently smoking OR have quit w/in 15years.) does not qualify.     Additional Screening:  Hepatitis C Screening: does not qualify;   Vision Screening: Recommended annual ophthalmology exams for early detection of glaucoma and other disorders of the eye. Is the patient up to date with their annual eye exam?  No  Who is the provider or what is the name of the office in which the patient attends annual eye exams? [facility]  Screening: Recommended annual dental exams for proper oral hygiene  Community Resource Referral / Chronic Care Management: CRR required this visit?  No   CCM required this visit?  No      Plan:     I have personally reviewed and noted the following in the patients chart:   Medical and social history Use of alcohol, tobacco or illicit drugs  Current medications and supplements including opioid prescriptions.  Functional ability and status Nutritional status Physical activity Advanced directives List of other physicians Hospitalizations, surgeries, and ER visits in previous 12 months Vitals Screenings to include cognitive, depression, and falls Referrals and appointments  In addition, I have reviewed and discussed with patient certain preventive protocols, quality metrics, and best practice recommendations. A written personalized care plan for preventive services as well as general preventive health recommendations were provided to patient.   Due to this being a telephonic visit, the after visit summary with patients personalized plan was offered to patient via mail or my-chart. Patient would like to access on my-chart.    Loma Messing, LPN  0/01/130   Nurse Health Advisor  Nurse Notes:  none

## 2021-12-22 NOTE — Telephone Encounter (Signed)
Patient called stating her left hip is painful and is planning to call her orthopedic doctor for cortisone injection.  Patient wanted to make sure it was okay for her to have the cortisone injection while taking Methotrexate.  Patient requested a return call.

## 2021-12-24 ENCOUNTER — Ambulatory Visit (INDEPENDENT_AMBULATORY_CARE_PROVIDER_SITE_OTHER): Payer: Medicare Other

## 2021-12-24 VITALS — Ht 64.0 in | Wt 184.0 lb

## 2021-12-24 DIAGNOSIS — Z0000 Encounter for general adult medical examination without abnormal findings: Secondary | ICD-10-CM | POA: Diagnosis not present

## 2021-12-24 NOTE — Patient Instructions (Signed)
Tammy Manning , Thank you for taking time to complete your Medicare Wellness Visit. I appreciate your ongoing commitment to your health goals. Please review the following plan we discussed and let me know if I can assist you in the future.   Screening recommendations/referrals: Colonoscopy: no longer required  Mammogram: up to date, completed 01/14/21, due 01/14/22 Bone Density: up to date, completed 05/29/20, due 05/29/22 Recommended yearly ophthalmology/optometry visit for glaucoma screening and checkup Recommended yearly dental visit for hygiene and checkup  Vaccinations: Influenza vaccine: up to date Pneumococcal vaccine: up to date Tdap vaccine: up to date, completed 02/28/13, due 03/01/23 Shingles vaccine: Discuss with local pharmacy   Covid-19:newest booster available at your local pharmacy  Advanced directives: Please bring a copy of Living Will and/or Healthcare Power of Attorney for your chart.   Conditions/risks identified: see problem   Next appointment: Follow up in one year for your annual wellness visit 12/25/22 @ 1:15pm, this will be a telephone visit   Preventive Care 65 Years and Older, Female Preventive care refers to lifestyle choices and visits with your health care provider that can promote health and wellness. What does preventive care include? A yearly physical exam. This is also called an annual well check. Dental exams once or twice a year. Routine eye exams. Ask your health care provider how often you should have your eyes checked. Personal lifestyle choices, including: Daily care of your teeth and gums. Regular physical activity. Eating a healthy diet. Avoiding tobacco and drug use. Limiting alcohol use. Practicing safe sex. Taking low-dose aspirin every day. Taking vitamin and mineral supplements as recommended by your health care provider. What happens during an annual well check? The services and screenings done by your health care provider during your annual  well check will depend on your age, overall health, lifestyle risk factors, and family history of disease. Counseling  Your health care provider may ask you questions about your: Alcohol use. Tobacco use. Drug use. Emotional well-being. Home and relationship well-being. Sexual activity. Eating habits. History of falls. Memory and ability to understand (cognition). Work and work Statistician. Reproductive health. Screening  You may have the following tests or measurements: Height, weight, and BMI. Blood pressure. Lipid and cholesterol levels. These may be checked every 5 years, or more frequently if you are over 3 years old. Skin check. Lung cancer screening. You may have this screening every year starting at age 19 if you have a 30-pack-year history of smoking and currently smoke or have quit within the past 15 years. Fecal occult blood test (FOBT) of the stool. You may have this test every year starting at age 71. Flexible sigmoidoscopy or colonoscopy. You may have a sigmoidoscopy every 5 years or a colonoscopy every 10 years starting at age 47. Hepatitis C blood test. Hepatitis B blood test. Sexually transmitted disease (STD) testing. Diabetes screening. This is done by checking your blood sugar (glucose) after you have not eaten for a while (fasting). You may have this done every 1-3 years. Bone density scan. This is done to screen for osteoporosis. You may have this done starting at age 57. Mammogram. This may be done every 1-2 years. Talk to your health care provider about how often you should have regular mammograms. Talk with your health care provider about your test results, treatment options, and if necessary, the need for more tests. Vaccines  Your health care provider may recommend certain vaccines, such as: Influenza vaccine. This is recommended every year. Tetanus, diphtheria, and  acellular pertussis (Tdap, Td) vaccine. You may need a Td booster every 10 years. Zoster  vaccine. You may need this after age 65. Pneumococcal 13-valent conjugate (PCV13) vaccine. One dose is recommended after age 38. Pneumococcal polysaccharide (PPSV23) vaccine. One dose is recommended after age 84. Talk to your health care provider about which screenings and vaccines you need and how often you need them. This information is not intended to replace advice given to you by your health care provider. Make sure you discuss any questions you have with your health care provider. Document Released: 12/06/2015 Document Revised: 07/29/2016 Document Reviewed: 09/10/2015 Elsevier Interactive Patient Education  2017 [place] Prevention in the Home Falls can cause injuries. They can happen to people of all ages. There are many things you can do to make your home safe and to help prevent falls. What can I do on the outside of my home? Regularly fix the edges of walkways and driveways and fix any cracks. Remove anything that might make you trip as you walk through a door, such as a raised step or threshold. Trim any bushes or trees on the path to your home. Use bright outdoor lighting. Clear any walking paths of anything that might make someone trip, such as rocks or tools. Regularly check to see if handrails are loose or broken. Make sure that both sides of any steps have handrails. Any raised decks and porches should have guardrails on the edges. Have any leaves, snow, or ice cleared regularly. Use sand or salt on walking paths during winter. Clean up any spills in your garage right away. This includes oil or grease spills. What can I do in the bathroom? Use night lights. Install grab bars by the toilet and in the tub and shower. Do not use towel bars as grab bars. Use non-skid mats or decals in the tub or shower. If you need to sit down in the shower, use a plastic, non-slip stool. Keep the floor dry. Clean up any water that spills on the floor as soon as it happens. Remove  soap buildup in the tub or shower regularly. Attach bath mats securely with double-sided non-slip rug tape. Do not have throw rugs and other things on the floor that can make you trip. What can I do in the bedroom? Use night lights. Make sure that you have a light by your bed that is easy to reach. Do not use any sheets or blankets that are too big for your bed. They should not hang down onto the floor. Have a firm chair that has side arms. You can use this for support while you get dressed. Do not have throw rugs and other things on the floor that can make you trip. What can I do in the kitchen? Clean up any spills right away. Avoid walking on wet floors. Keep items that you use a lot in easy-to-reach places. If you need to reach something above you, use a strong step stool that has a grab bar. Keep electrical cords out of the way. Do not use floor polish or wax that makes floors slippery. If you must use wax, use non-skid floor wax. Do not have throw rugs and other things on the floor that can make you trip. What can I do with my stairs? Do not leave any items on the stairs. Make sure that there are handrails on both sides of the stairs and use them. Fix handrails that are broken or loose. Make sure that  handrails are as long as the stairways. Check any carpeting to make sure that it is firmly attached to the stairs. Fix any carpet that is loose or worn. Avoid having throw rugs at the top or bottom of the stairs. If you do have throw rugs, attach them to the floor with carpet tape. Make sure that you have a light switch at the top of the stairs and the bottom of the stairs. If you do not have them, ask someone to add them for you. What else can I do to help prevent falls? Wear shoes that: Do not have high heels. Have rubber bottoms. Are comfortable and fit you well. Are closed at the toe. Do not wear sandals. If you use a stepladder: Make sure that it is fully opened. Do not climb a  closed stepladder. Make sure that both sides of the stepladder are locked into place. Ask someone to hold it for you, if possible. Clearly mark and make sure that you can see: Any grab bars or handrails. First and last steps. Where the edge of each step is. Use tools that help you move around (mobility aids) if they are needed. These include: Canes. Walkers. Scooters. Crutches. Turn on the lights when you go into a dark area. Replace any light bulbs as soon as they burn out. Set up your furniture so you have a clear path. Avoid moving your furniture around. If any of your floors are uneven, fix them. If there are any pets around you, be aware of where they are. Review your medicines with your doctor. Some medicines can make you feel dizzy. This can increase your chance of falling. Ask your doctor what other things that you can do to help prevent falls. This information is not intended to replace advice given to you by your health care provider. Make sure you discuss any questions you have with your health care provider. Document Released: 09/05/2009 Document Revised: 04/16/2016 Document Reviewed: 12/14/2014 Elsevier Interactive Patient Education  2017 [employer].

## 2021-12-25 DIAGNOSIS — M25552 Pain in left hip: Secondary | ICD-10-CM | POA: Diagnosis not present

## 2021-12-29 ENCOUNTER — Ambulatory Visit (INDEPENDENT_AMBULATORY_CARE_PROVIDER_SITE_OTHER): Payer: Medicare Other | Admitting: Internal Medicine

## 2021-12-29 ENCOUNTER — Encounter: Payer: Self-pay | Admitting: Internal Medicine

## 2021-12-29 ENCOUNTER — Other Ambulatory Visit: Payer: Self-pay

## 2021-12-29 VITALS — BP 165/63 | HR 80 | Resp 17 | Ht 60.0 in | Wt 191.0 lb

## 2021-12-29 DIAGNOSIS — Z79899 Other long term (current) drug therapy: Secondary | ICD-10-CM

## 2021-12-29 DIAGNOSIS — M059 Rheumatoid arthritis with rheumatoid factor, unspecified: Secondary | ICD-10-CM | POA: Diagnosis not present

## 2021-12-29 MED ORDER — METHOTREXATE SODIUM CHEMO INJECTION 50 MG/2ML: 15.0000 mg | INTRAMUSCULAR | 1 refills | Status: DC

## 2021-12-29 MED ORDER — FOLIC ACID 1 MG PO TABS: 1.0000 mg | ORAL_TABLET | Freq: Every day | ORAL | 0 refills | Status: DC

## 2021-12-29 NOTE — Progress Notes (Signed)
Office Visit Note  Patient: Tammy Manning             Date of Birth: [birth year]           MRN: [id number]             PCP: Abner Greenspan, MD Referring: [name], [name], MD Visit Date: 12/29/2021   Subjective:  Results (Lab results)   History of Present Illness: Tammy Manning is a 87 y.o. female here for follow up for RA after restarting methotrexate at 15 mg [place] weekly, which had been delayed due to repeated interruptions from UTI infections and antibiotic treatment. She has noticed improvement in joint symptoms after about 2 weeks taking the methotrexate. She continues to have some nausea when she takes this despite subcutaneous route.  Previous HPI 12/01/21 Tammy Manning is a 87 y.o. female here for follow up for seropositive RA started prednisone and methotrexate after last visit but discontinued due to development of another UTI. Her joint pain is not as bad as before starting treatments last year but does have some ongoing pain and stiffness. She notices some GI upset with the methotrexate and folic acid so is interested in alternatives. She met with her urologist earlier today to discuss plan she may or may not be taking more prolonged antibiotics due to the recurrent recent UTIs.    Previous HPI 11/25/20 Tammy Manning is a 87 y.o. female with a history of afib, TIA, and OA here for evaluation of joint pain and positive rheumatoid factor. She has joint pain in multiple sites including both hands but also has a lot of pain with the right shoulder that started more acutely with suspected rotator cuff tear. 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 She does notice swelling in hands intermittently. She has morning stiffness lasting less than 30 minutes daily also persistent pain throughout the day. She has had left radius fracture with surgical repair but no other major surgery of her arms.   Review of Systems   Constitutional:  Positive for fatigue.  HENT:  Positive for mouth dryness.   Eyes:  Positive for dryness.  Respiratory:  Negative for shortness of breath.   Cardiovascular:  Negative for swelling in legs/feet.  Gastrointestinal:  Positive for constipation.  Endocrine: Positive for cold intolerance, heat intolerance, excessive thirst and increased urination.  Genitourinary:  Positive for difficulty urinating and painful urination.  Musculoskeletal:  Positive for muscle weakness, morning stiffness and muscle tenderness.  Skin:  Negative for rash.  Allergic/Immunologic: Positive for susceptible to infections.  Neurological:  Positive for weakness.  Hematological:  Positive for bruising/bleeding tendency.  Psychiatric/Behavioral:  Negative for sleep disturbance.    PMFS History:  Patient Active Problem List   Diagnosis Date Noted   Recurrent UTI 11/25/2021   High risk medication use 10/07/2021   Blood urine 06/12/2021   Vaginal bleeding 06/12/2021   Atrial fibrillation with RVR ([place]) 04/28/2021   Hypertrophic toenail 04/22/2021   Cough 03/24/2021   Pedal edema 03/24/2021   Leg pain, left 03/24/2021   Seropositive rheumatoid arthritis ([place]) 11/25/2020   Bilateral hand pain 11/25/2020   Bilateral shoulder pain 10/16/2020   Joint pain 10/16/2020   Joint swelling 10/16/2020   Estrogen deficiency 04/30/2020   Low back pain 03/15/2020   Dry mouth 03/15/2020   Osteoarthritis of right hip 01/02/2020   Groin pain, right 01/01/2020   Trochanteric bursitis of right hip 11/09/2018  Transient global amnesia 05/12/2018   Right thyroid nodule 05/12/2018   History of TIA (transient ischemic attack) 05/09/2018   Aortic insufficiency 09/10/2015   Stress reaction 09/08/2015   Encounter for Medicare annual wellness exam 02/28/2013   Bucket-handle tear of lateral meniscus of left knee as current injury 12/30/2012   Episodic atrial fibrillation ([place]) 11/17/2012   Special screening for  malignant neoplasms, colon 01/21/2012   Hearing loss of both ears 01/14/2012   HEADACHE, CHRONIC 09/24/2010   B12 deficiency 03/17/2010   GERD (gastroesophageal reflux disease) 03/17/2010   POSTMENOPAUSAL STATUS 08/20/2008   Essential hypertension 07/19/2007   Hyperlipidemia 06/24/2007   CONSTIPATION 06/24/2007   IBS 06/24/2007   DERMATITIS, ATOPIC 06/24/2007   SKIN CANCER, HX OF 06/24/2007    Past Medical History:  Diagnosis Date   A-fib ([place])    Anginal pain ([place])    Aortic insufficiency    a. 02/2020 Echo: EF 60-65%, no rwma, Gr1 DD, nl RV fxn, mildly dil LA, triv AI, Asc Ao 66m; b. 04/2021 Echo: EF 60-65%, no rwma, GrI DD, nl RV fxn, mildly dil LA, AI not visualized. Mild-mod AoV sclerosis w/o stenosis.   Arthritis    Knees   B12 deficiency    Mild   Bucket-handle tear of lateral meniscus of left knee as current injury 12/30/2012   Cancer ([place]    Skin, basal cell on nose and eyelid   DJD (degenerative joint disease)    Low back   Dyspepsia    GERD (gastroesophageal reflux disease)    Heart murmur    Heart palpitations    History of cardiac cath    a. 02/2017 Cath: Nl cors. Nl LV fxn. Abd Aogram w/o RAS.   HOH (hard of hearing)    Hyperlipidemia    Hypertension    IBS (irritable bowel syndrome)    Lactose intolerance    Mixed incontinence    PONV (postoperative nausea and vomiting)    Vulvar irritation    from urine pad, uses Triamcinolone oint.    Family History  Problem Relation Age of Onset   Cancer Mother        Colon   Heart disease Father        CAD   Diabetes Sister    Hypothyroidism Sister    Diabetes Brother    Leukemia Brother    Esophageal cancer Brother    Cancer Paternal Aunt        Breast   Diabetes Sister    Alzheimer's disease Sister    Diabetes Brother    Breast cancer Daughter    Past Surgical History:  Procedure Laterality Date   ABDOMINAL AORTOGRAM N/A 03/15/2017   Procedure: Abdominal Aortogram;  Surgeon: MWellington Hampshire MD;   [facility];  Service: Cardiovascular;  Laterality: N/A;   ABDOMINAL HYSTERECTOMY  1982   Large fibroids and bleeding   APPENDECTOMY     BILATERAL SALPINGOOPHORECTOMY  1986   BONE GRAFT HIP ILIAC CREST     To Left arm   BREAST REDUCTION SURGERY     BREAST SURGERY     CARDIAC CATHETERIZATION     CATARACT EXTRACTION W/PHACO Left 07/07/2016   Procedure: CATARACT EXTRACTION PHACO AND INTRAOCULAR LENS PLACEMENT (IOC);  Surgeon: WBirder Robson MD;  Location: [facility];  Service: Ophthalmology;  Laterality: Left;  [place]:10 AP% 18.3 CDE 12.91 Fluid pak lot # [id number]   CATARACT EXTRACTION W/PHACO Right 07/28/2016   Procedure: CATARACT EXTRACTION PHACO AND INTRAOCULAR LENS  PLACEMENT (IOC);  Surgeon: Birder Robson, MD;  Location: [facility];  Service: Ophthalmology;  Laterality: Right;  [place] 01:26 AP% 18.5 CDE 16.12 Fluid pack lot # [id number]   COLONOSCOPY  07/2016   Dr. Thana Farr   DILATION AND CURETTAGE OF UTERUS     miscarragesx2   ESOPHAGOGASTRODUODENOSCOPY     Polyps   HEMORRHOID SURGERY  2006   KNEE ARTHROSCOPY WITH LATERAL MENISECTOMY Left 12/30/2012   Procedure: KNEE ARTHROSCOPY WITH LATERAL MENISECTOMY;  Surgeon: Johnny Bridge, MD;  Location: [facility];  Service: Orthopedics;  Laterality: Left;  LEFT KNEE SCOPE LATERAL MENISCECTOMY   LEFT HEART CATH AND CORONARY ANGIOGRAPHY N/A 03/15/2017   Procedure: Left Heart Cath and Coronary Angiography;  Surgeon: Wellington Hampshire, MD;  Location: [facility];  Service: Cardiovascular;  Laterality: N/A;   MOUTH SURGERY     skin cancer eyelid  2012   TONSILLECTOMY     Social History   Social History Narrative   Metallurgist.     Immunization History  Administered Date(s) Administered   Fluad Quad(high Dose 65+) 10/16/2020   Influenza Split 07/24/2011, 08/23/2014   Influenza Whole 08/23/2004, 07/24/2009, 08/06/2010, 08/05/2012   Influenza, High Dose Seasonal PF 08/11/2016, 08/27/2017, 08/02/2018,  09/05/2021   Influenza,inj,Quad PF,6+ Mos 08/16/2013, 08/07/2015   Influenza-Unspecified 09/05/2021   PFIZER(Purple Top)SARS-COV-2 Vaccination 12/15/2019, 01/05/2020, 10/25/2020   Pneumococcal Conjugate-13 08/24/2014   Pneumococcal Polysaccharide-23 08/20/2008   Td 03/20/2003, 02/28/2013   Zoster, Live 09/04/2015     Objective: Vital Signs: BP (!) 165/63 (BP Location: Right Arm, Patient Position: Sitting, Cuff Size: Normal)    Pulse 80    Resp 17    Ht 5' (1.524 m)    Wt 191 lb (86.6 kg)    BMI 37.30 kg/m    Physical Exam Constitutional:      Appearance: She is obese.  Cardiovascular:     Rate and Rhythm: Normal rate and regular rhythm.  Pulmonary:     Effort: Pulmonary effort is normal.     Breath sounds: Normal breath sounds.  Musculoskeletal:     Right lower leg: No edema.     Left lower leg: No edema.  Skin:    General: Skin is warm and dry.     Findings: No rash.  Neurological:     Mental Status: She is alert.  Psychiatric:        Mood and Affect: Mood normal.     Musculoskeletal Exam:  Neck full ROM no tenderness Shoulders full ROM no tenderness or swelling Elbows full ROM no tenderness or swelling Wrists full ROM no tenderness or swelling Fingers full ROM no tenderness or swelling Knees full ROM, mild left knee tenderness and swelling at medial edge   CDAI Exam: CDAI Score: 4  Patient Global: 10 mm; Provider Global: 10 mm Swollen: 1 ; Tender: 1  Joint Exam 12/29/2021      Right  Left  Knee     Swollen Tender     Investigation: No additional findings.  Imaging: No results found.  Recent Labs: Lab Results  Component Value Date   WBC 10.3 10/07/2021   HGB 12.1 10/07/2021   PLT 467 (H) 10/07/2021   NA 137 10/07/2021   K 4.4 10/07/2021   CL 100 10/07/2021   CO2 27 10/07/2021   GLUCOSE 86 10/07/2021   BUN 20 10/07/2021   CREATININE 0.75 10/07/2021   BILITOT 0.5 10/07/2021   ALKPHOS 58 06/08/2021   AST 12 10/07/2021  ALT 7 10/07/2021    PROT 6.6 10/07/2021   ALBUMIN 3.9 06/08/2021   CALCIUM 9.7 10/07/2021   GFRAA 71 02/02/2020    Speciality Comments: No specialty comments available.  Procedures:  No procedures performed Allergies: Antihistamines, chlorpheniramine-type; Atenolol; Cefuroxime axetil; Ciprofloxacin; Co q 10 [coenzyme q10]; Crestor [rosuvastatin calcium]; Dextromethorphan-guaifenesin; Epinephrine; Ramipril; Sulfamethoxazole-trimethoprim; and Vitamin d analogs   Assessment / Plan:     Visit Diagnoses: Seropositive rheumatoid arthritis ([place]) - Plan: Sedimentation rate, methotrexate 50 ZO/1WR injection, folic acid (FOLVITE) 1 MG tablet  Reports improved range of motion decrease morning stiffness and pain especially in hand joints.  Still very early on the methotrexate so hopefully will see additional improvement.  She is curious about duration of treatment I recommend we see a sustained symptom benefit minimum 6 months she has very low positive RF titers so may not need long-term treatment.  For now continuing methotrexate 15 mg subcu weekly and folic acid 1 mg daily.  Checking sedimentation rate for activity monitoring.  High risk medication use - Plan: CBC with Differential/Platelet, COMPLETE METABOLIC PANEL WITH GFR  Now 3 doses into methotrexate treatment we will recheck CBC and CMP for medication monitoring.  Orders: Orders Placed This Encounter  Procedures   Sedimentation rate   CBC with Differential/Platelet   COMPLETE METABOLIC PANEL WITH GFR   Meds ordered this encounter  Medications   methotrexate 50 MG/2ML injection    Sig: Inject 0.6 mLs (15 mg total) into the skin once a week.    Dispense:  4 mL    Refill:  1   folic acid (FOLVITE) 1 MG tablet    Sig: Take 1 tablet (1 mg total) by mouth daily.    Dispense:  90 tablet    Refill:  0     Follow-Up Instructions: Return in about 10 weeks (around 03/09/2022) for RA on MTX f/u.   Collier Salina, MD  Note - This record has been created  using [employer].  Chart creation errors have been sought, but may not always  have been located. Such creation errors do not reflect on  the standard of medical care.

## 2021-12-30 LAB — COMPLETE METABOLIC PANEL WITH GFR
AG Ratio: 1.5 (calc) (ref 1.0–2.5)
ALT: 16 U/L (ref 6–29)
AST: 13 U/L (ref 10–35)
Albumin: 3.8 g/dL (ref 3.6–5.1)
Alkaline phosphatase (APISO): 44 U/L (ref 37–153)
BUN/Creatinine Ratio: 41 (calc) — ABNORMAL HIGH (ref 6–22)
BUN: 34 mg/dL — ABNORMAL HIGH (ref 7–25)
CO2: 21 mmol/L (ref 20–32)
Calcium: 9 mg/dL (ref 8.6–10.4)
Chloride: 102 mmol/L (ref 98–110)
Creat: 0.83 mg/dL (ref 0.60–0.95)
Globulin: 2.5 g/dL (calc) (ref 1.9–3.7)
Glucose, Bld: 125 mg/dL — ABNORMAL HIGH (ref 65–99)
Potassium: 4.6 mmol/L (ref 3.5–5.3)
Sodium: 134 mmol/L — ABNORMAL LOW (ref 135–146)
Total Bilirubin: 0.3 mg/dL (ref 0.2–1.2)
Total Protein: 6.3 g/dL (ref 6.1–8.1)
eGFR: 69 mL/min/{1.73_m2} (ref >=60)

## 2021-12-30 LAB — CBC WITH DIFFERENTIAL/PLATELET
Absolute Monocytes: 836 cells/uL (ref 200–950)
Basophils Absolute: 11 cells/uL (ref 0–200)
Basophils Relative: 0.1 %
Eosinophils Absolute: 0 cells/uL — ABNORMAL LOW (ref 15–500)
Eosinophils Relative: 0 %
HCT: 35.7 % (ref 35.0–45.0)
Hemoglobin: 11.7 g/dL (ref 11.7–15.5)
Lymphs Abs: 1408 cells/uL (ref 850–3900)
MCH: 30.7 pg (ref 27.0–33.0)
MCHC: 32.8 g/dL (ref 32.0–36.0)
MCV: 93.7 fL (ref 80.0–100.0)
MPV: 9.2 fL (ref 7.5–12.5)
Monocytes Relative: 7.6 %
Neutro Abs: 8745 cells/uL — ABNORMAL HIGH (ref 1500–7800)
Neutrophils Relative %: 79.5 %
Platelets: 386 10*3/uL (ref 140–400)
RBC: 3.81 10*6/uL (ref 3.80–5.10)
RDW: 14.5 % (ref 11.0–15.0)
Total Lymphocyte: 12.8 %
WBC: 11 10*3/uL — ABNORMAL HIGH (ref 3.8–10.8)

## 2021-12-30 LAB — SEDIMENTATION RATE: Sed Rate: 33 mm/h — ABNORMAL HIGH (ref 0–30)

## 2022-01-02 NOTE — Progress Notes (Signed)
Lab results look okay for methotrexate. Her sedimentation rate is partially improved so far.

## 2022-01-05 ENCOUNTER — Other Ambulatory Visit: Payer: Self-pay | Admitting: Internal Medicine

## 2022-01-05 DIAGNOSIS — M059 Rheumatoid arthritis with rheumatoid factor, unspecified: Secondary | ICD-10-CM

## 2022-01-07 ENCOUNTER — Telehealth: Payer: Self-pay

## 2022-01-07 NOTE — Telephone Encounter (Signed)
Patient left a voicemail stating she is due to take her Methotrexate injection today and doesn't have enough in the vial and doesn't have a syringe.  Patient states "so far it hasn't been called in anywhere" and requested a return call.

## 2022-01-07 NOTE — Telephone Encounter (Signed)
Returned call to patient and she states she has been able to pick up her prescription from the pharmacy.

## 2022-01-09 ENCOUNTER — Encounter: Payer: Self-pay | Admitting: Family Medicine

## 2022-01-09 ENCOUNTER — Other Ambulatory Visit: Payer: Self-pay

## 2022-01-09 ENCOUNTER — Telehealth: Payer: Self-pay

## 2022-01-09 ENCOUNTER — Ambulatory Visit (INDEPENDENT_AMBULATORY_CARE_PROVIDER_SITE_OTHER): Payer: Medicare Other | Admitting: Family Medicine

## 2022-01-09 VITALS — BP 130/70 | HR 88 | Temp 98.0°F | Wt 184.1 lb

## 2022-01-09 DIAGNOSIS — B029 Zoster without complications: Secondary | ICD-10-CM

## 2022-01-09 MED ORDER — VALACYCLOVIR HCL 1 G PO TABS: 1000.0000 mg | ORAL_TABLET | Freq: Three times a day (TID) | ORAL | 0 refills | Status: AC

## 2022-01-09 NOTE — Progress Notes (Addendum)
Chronic Care Management Pharmacy Assistant   Name: Tammy Manning  MRN: [id number] DOB: 06/23/1935  Reason for Encounter: CCM (General Adherence)   Recent office visits:  12/24/2021 - Orrin Brigham, LPN - Patient presented for Annual Wellness Visit.   Recent consult visits:  12/29/2021 - Vernelle Emerald, MD - Rheumatology - Patient presented for seropositive rheumatoid arthritis. Labs: CBC, CMP, Sedimentation rate. Start: folic acid (FOLVITE) 1 MG tablet. 12/15/2021 - John Giovanni, MD - Telephone - Start: Macrobid 100 mg daily for 90 days due to indigestion.  12/11/2021 Dalia Heading, CMA - Patient presented for B12 injection. Administered: cyanocobalamin ((VITAMIN B-12)) injection 1,000 mcg. 12/09/2021 - Kathlyn Sacramento, MD - Cardiology - Patient presented for paroxysmal atrial fibrillation. Orders: EKG. No medication changes.  12/05/2021 - Urology orders - Start: Doxycycline Monohydrate 100 mg twice daily for 7 days. Start: Trimethoprim 100 mg daily. Start Trimethoprim after completes Doxycycline.  12/01/2021 - Vernelle Emerald, MD - Rheumatology - Patient presented for seropositive rheumatoid arthritis. Start: methotrexate 50 MG/2ML injection - 15 mg subcutaneous weekly.  12/01/2021 - John Giovanni, MD - Urology - Patient presented for recurrent UTI. Labs: Urine culture, Microscopic exam, Urinalysis and Bladder Scan. Stop due to completed course: folic acid (FOLVITE) 1 MG tablet. Stop as patient not taking: nitrofurantoin, macrocrystal-monohydrate, (MACROBID) 100 MG capsule. Start: OTC Cranberry and D-Mannose tablets daily.  11/12/2021 - Vernelle Emerald, MD - Rheumatology - Patient Message: Stop Folic Acid due to irritant to digestive tract. Discontinue Prednisone after completed course.  11/11/2021 - Alben Deeds, MD - OBGYN - Patient presented for recurrent UTI. Labs: POCT Urinalysis and urine culture. No medication noted.  11/07/2021 - Gwendolyn Grant, NP - Cardiology - Patient  presented for left leg swelling. Ordered: EKG. Stop due to completed course: nitrofurantoin, macrocrystal-monohydrate, (MACROBID) 100 MG capsule. Stop due to completed course: traMADol (ULTRAM) 50 MG tablet.  Hospital visits:  None in previous 6 months  Medications: Outpatient Encounter Medications as of 01/09/2022  Medication Sig   acetaminophen (TYLENOL) 500 MG tablet Take 500 mg by mouth every 6 (six) hours as needed.   amitriptyline (ELAVIL) 10 MG tablet TAKE ONE TABLET BY MOUTH AT BEDTIME   apixaban (ELIQUIS) 5 MG TABS tablet TAKE 1 TABLET(5 MG) BY MOUTH TWICE DAILY   atorvastatin (LIPITOR) 10 MG tablet TAKE 1 TABLET BY MOUTH EVERY DAY AT 6 PM   Calcium Carbonate-Vitamin D (CALCIUM 600+D PO) Take 1 tablet by mouth daily.    cephALEXin (KEFLEX) 500 MG capsule Take 1 capsule (500 mg total) by mouth daily. (Patient not taking: Reported on 12/24/2021)   clobetasol ointment (TEMOVATE) 7.67 % Apply 1 application topically 2 (two) times daily as needed.   clorazepate (TRANXENE) 7.5 MG tablet Take 1 tablet (7.5 mg total) by mouth daily as needed.   cyanocobalamin (,VITAMIN B-12,) 1000 MCG/ML injection Inject 1,000 mcg into the muscle every 30 (thirty) days.    diltiazem (CARDIZEM CD) 180 MG 24 hr capsule Take 1 capsule (180 mg total) by mouth daily.   folic acid (FOLVITE) 1 MG tablet Take 1 tablet (1 mg total) by mouth daily.   guaiFENesin (MUCINEX) 600 MG 12 hr tablet Take 600 mg by mouth 2 (two) times daily as needed.   irbesartan (AVAPRO) 300 MG tablet TAKE 1 TABLET BY MOUTH DAILY   loperamide (IMODIUM) 2 MG capsule Take 2 mg by mouth as needed for diarrhea or loose stools.   MAGNESIUM LACTATE PO Take by mouth 2 (two) times daily.   Methotrexate  Sodium (METHOTREXATE, PF,) 50 MG/2ML injection INJECT 0.6 ML (15MG) SUBCUTANEOUSLY ONCEA WEEK   nitrofurantoin, macrocrystal-monohydrate, (MACROBID) 100 MG capsule Take 1 capsule (100 mg total) by mouth daily.   raNITIdine HCl (ZANTAC PO) Take by mouth.    triamcinolone (NASACORT) 55 MCG/ACT AERO nasal inhaler Place 2 sprays into the nose daily.   TUBERCULIN SYR 1CC/26GX3/8" 26G X 3/8" 1 ML MISC For injection of subcutaneous methotrexate once weekly   No facility-administered encounter medications on file as of 01/09/2022.    Contacted [facility] on 01/12/2022 for general disease state and medication adherence call.   Patient is more than 5 days past due for refill on the following medications per chart history: Atorvastatin 10 mg - I called [facility] and had medication refilled for patient.   [facility] Medications: Medication Name/mg Last Fill Days Supply Atorvastatin 10 mg  10/08/2021 90  Irbesartan 300 mg  12/11/2021 90 Fill dates verified with [facility]  What concerns do you have about your medications? Patient called Cardiology today because she is taking Valacyclovir for Shingles. The medication (she feels) is making her heart rate go up and having shortness of breath. Her heart rate has been between 98-110. Patient stated she is also having a hard time sleeping due to the medications. Patient is afraid of going into Afib or having a stroke.   The patient reports the following side effects with their medications. Please see answer to "What concerns do you have about your medications".   How often do you forget or accidentally miss a dose? Rarely  Do you use a pillbox? No  Are you having any problems getting your medications from your pharmacy? No  Has the cost of your medications been a concern? No  Since last visit with CPP, no interventions have been made.   The patient has not had an ED visit since last contact.   The patient reports the following problems with their health. Please see answer to "What concerns do you have about your medications".   Patient reports the following concerns or questions for [facility]. Please see answer to "What concerns do you have about your medications".   Spoke with patient in  regards to her UTI. Patient states she is doing well and has 1 month remaining on her low dose antibiotic. Patient did report white spots on her tongue and her mouth tasting terrible. Patient stated the symptoms have been there for 7-8 days. Patient has only been brushing the white spots away in the morning. No other treatment.   Also spoke with patient about her arthritis. Patient is very happy with the outcome of her Methotrexate 50 mg/2 ML injections. Patient stated her hand and joints feel good. She is much better.   Care Gaps: Annual wellness visit in last year? Yes 12/24/2021 Most Recent BP reading: 165/83 on 12/29/2021  Upcoming appointments: PCP appointment on 01/23/2022 for Physical CCM appointment on 04/27/2022 with Charlene Brooke @ 1:00 pm  Charlene Brooke, CPP notified  Marijean Niemann, [place] Assistant [phone]  01/20/22 PharmD Update: pt has discussed her Valtrex concerns with PCP and cardiologist. No further action needed.  I have reviewed the care management and care coordination activities outlined in this encounter and I am certifying that I agree with the content of this note. No further action required.  [place], [facility] 01/20/22 5:24 PM

## 2022-01-09 NOTE — Progress Notes (Signed)
° °  Subjective:     Tammy Manning is a 87 y.o. female presenting for Rash (L side of neck x 2 days) and Pain (In back of neck and on scalp)     Rash    #rash - on the left side of the neck  - and upper chest - has had a shingles shot in the past - painful area of the neck - stabbing pain started with the rash  Review of Systems  Skin:  Positive for rash.    Social History   Tobacco Use  Smoking Status Former   Packs/day: 1.00   Years: 12.00   Pack years: 12.00   Types: Cigarettes   Quit date: 11/24/1963   Years since quitting: 58.1  Smokeless Tobacco Never        Objective:    BP Readings from Last 3 Encounters:  01/09/22 130/70  12/29/21 (!) 165/63  12/09/21 128/64   Wt Readings from Last 3 Encounters:  01/09/22 184 lb 2 oz (83.5 kg)  12/29/21 191 lb (86.6 kg)  12/24/21 184 lb (83.5 kg)    BP 130/70    Pulse 88    Temp 98 F (36.7 C) (Oral)    Wt 184 lb 2 oz (83.5 kg)    SpO2 96%    BMI 35.96 kg/m    Physical Exam Constitutional:      General: She is not in acute distress.    Appearance: She is well-developed. She is not diaphoretic.  HENT:     Right Ear: External ear normal.     Left Ear: External ear normal.  Eyes:     Conjunctiva/sclera: Conjunctivae normal.  Cardiovascular:     Rate and Rhythm: Normal rate.  Pulmonary:     Effort: Pulmonary effort is normal.  Musculoskeletal:     Cervical back: Neck supple.  Skin:    General: Skin is warm and dry.     Capillary Refill: Capillary refill takes less than 2 seconds.     Comments: Erythematous rash with a few scattered vesicles on the C2-3 left distribution  Neurological:     Mental Status: She is alert. Mental status is at baseline.  Psychiatric:        Mood and Affect: Mood normal.        Behavior: Behavior normal.          Assessment & Plan:   Problem List Items Addressed This Visit   None Visit Diagnoses     Herpes zoster without complication    -  Primary   Relevant  Medications   valACYclovir (VALTREX) 1000 MG tablet      Patient with rash consistent with shingles.  She is on methotrexate but also within 2048 hrs. of rash onset.  We will treat with valacyclovir.  She is having some stabbing pain discussed if this persists to follow-up with PCP has scheduled wellness visit in approximately 10 days.  Return if symptoms worsen or fail to improve.  Tammy Noe, MD  This visit occurred during the SARS-CoV-2 public health emergency.  Safety protocols were in place, including screening questions prior to the visit, additional usage of staff PPE, and extensive cleaning of exam room while observing appropriate contact time as indicated for disinfecting solutions.

## 2022-01-09 NOTE — Patient Instructions (Addendum)
Shingles - Can still get shingles shot once better - take valacyclovir three times a day for 7 days - update if worsening pain - burning, stabbing - may need pain not improving

## 2022-01-12 ENCOUNTER — Telehealth: Payer: Self-pay | Admitting: Family Medicine

## 2022-01-12 ENCOUNTER — Telehealth: Payer: Self-pay | Admitting: Cardiovascular Disease

## 2022-01-12 DIAGNOSIS — B029 Zoster without complications: Secondary | ICD-10-CM

## 2022-01-12 MED ORDER — GABAPENTIN 300 MG PO CAPS: ORAL_CAPSULE | ORAL | 0 refills | Status: DC

## 2022-01-12 NOTE — Telephone Encounter (Signed)
°  Pt c/o medication issue:  1. Name of Medication: valacyclovir   2. How are you currently taking this medication (dosage and times per day)? 1000 MG 1 tablet 3 times daily   3. Are you having a reaction (difficulty breathing--STAT)? High HR 98-110  4. What is your medication issue? Patient calling and states she started this medication for shingles but it has caused her to be unable to sleep and has had high pulse rate although it is steady and not going into afib .  Would like to know if she should continue this medication.  Please call to discuss.

## 2022-01-12 NOTE — Telephone Encounter (Signed)
Spoke with patient and she reports having shingles. Her PCP office started her on Valtrex and she has been having side effects from that medication. She is fearful that she may go into afib given her heart rates. She really wants to stop the medication due to these effects but advised she should call their office before stopping it abruptly. At this time she states that it fluctuates. Provided emotional support for her situation and encouraged her to call PCP office before stopping medication. She was agreeable to give them a call and discussed recommendations to call us back if her heart rates persistently remain elevated. She verbalized understanding of our conversation, agreement with plan, and had no further questions at this time.

## 2022-01-12 NOTE — Telephone Encounter (Signed)
Pt called wanting to discuss the Valtrex she is worried about going into to A-fib  Pt saw Dr. Einar Pheasant on 2/17

## 2022-01-12 NOTE — Addendum Note (Signed)
Addended by: Lesleigh Noe on: 01/12/2022 01:58 PM   Modules accepted: Orders

## 2022-01-12 NOTE — Telephone Encounter (Signed)
Notes the pain is very bad. Cannot sleep  Has noticed that her bp has increased as well as her heart rate 90-113.   Did start the medication.   Rash is improving   Discussed option of prednisone vs gabapentin. Pt would like to avoid prednsione  Start gabapentin  Day 1: 300 mg at night Day 2: 300 mg at night Day 3: 300 mg twice daily Day 4: 300 mg three times a day

## 2022-01-14 ENCOUNTER — Telehealth: Payer: Self-pay | Admitting: Family Medicine

## 2022-01-14 DIAGNOSIS — E7800 Pure hypercholesterolemia, unspecified: Secondary | ICD-10-CM

## 2022-01-14 DIAGNOSIS — I10 Essential (primary) hypertension: Secondary | ICD-10-CM

## 2022-01-14 DIAGNOSIS — E538 Deficiency of other specified B group vitamins: Secondary | ICD-10-CM

## 2022-01-14 NOTE — Telephone Encounter (Signed)
-----   Message from Ellamae Sia sent at 12/29/2021 12:45 PM EST ----- Regarding: Lab orders for Friday, 2.24.23 Patient is scheduled for CPX labs, please order future labs, Thanks , Karna Christmas

## 2022-01-15 ENCOUNTER — Ambulatory Visit: Payer: Medicare Other

## 2022-01-16 ENCOUNTER — Other Ambulatory Visit: Payer: Self-pay

## 2022-01-16 ENCOUNTER — Other Ambulatory Visit (INDEPENDENT_AMBULATORY_CARE_PROVIDER_SITE_OTHER): Payer: Medicare Other

## 2022-01-16 DIAGNOSIS — I10 Essential (primary) hypertension: Secondary | ICD-10-CM

## 2022-01-16 DIAGNOSIS — R600 Localized edema: Secondary | ICD-10-CM

## 2022-01-16 DIAGNOSIS — E7800 Pure hypercholesterolemia, unspecified: Secondary | ICD-10-CM

## 2022-01-16 DIAGNOSIS — E538 Deficiency of other specified B group vitamins: Secondary | ICD-10-CM

## 2022-01-16 LAB — VITAMIN B12: Vitamin B-12: 487 pg/mL (ref 211–911)

## 2022-01-16 LAB — CBC WITH DIFFERENTIAL/PLATELET
Basophils Absolute: 0.1 10*3/uL (ref 0.0–0.1)
Basophils Relative: 0.9 % (ref 0.0–3.0)
Eosinophils Absolute: 0.2 10*3/uL (ref 0.0–0.7)
Eosinophils Relative: 2.6 % (ref 0.0–5.0)
HCT: 34.5 % — ABNORMAL LOW (ref 36.0–46.0)
Hemoglobin: 11.3 g/dL — ABNORMAL LOW (ref 12.0–15.0)
Lymphocytes Relative: 42.6 % (ref 12.0–46.0)
Lymphs Abs: 2.6 10*3/uL (ref 0.7–4.0)
MCHC: 32.9 g/dL (ref 30.0–36.0)
MCV: 94.5 fl (ref 78.0–100.0)
Monocytes Absolute: 0.4 10*3/uL (ref 0.1–1.0)
Monocytes Relative: 6.7 % (ref 3.0–12.0)
Neutro Abs: 2.9 10*3/uL (ref 1.4–7.7)
Neutrophils Relative %: 47.2 % (ref 43.0–77.0)
Platelets: 335 10*3/uL (ref 150.0–400.0)
RBC: 3.65 Mil/uL — ABNORMAL LOW (ref 3.87–5.11)
RDW: 17.1 % — ABNORMAL HIGH (ref 11.5–15.5)
WBC: 6.1 10*3/uL (ref 4.0–10.5)

## 2022-01-16 LAB — LIPID PANEL
Cholesterol: 199 mg/dL (ref 0–200)
HDL: 59.4 mg/dL (ref >39.00)
LDL Cholesterol: 108 mg/dL — ABNORMAL HIGH (ref 0–99)
NonHDL: 139.37
Total CHOL/HDL Ratio: 3
Triglycerides: 157 mg/dL — ABNORMAL HIGH (ref 0.0–149.0)
VLDL: 31.4 mg/dL (ref 0.0–40.0)

## 2022-01-16 LAB — COMPREHENSIVE METABOLIC PANEL
ALT: 17 U/L (ref 0–35)
AST: 13 U/L (ref 0–37)
Albumin: 3.8 g/dL (ref 3.5–5.2)
Alkaline Phosphatase: 53 U/L (ref 39–117)
BUN: 16 mg/dL (ref 6–23)
CO2: 31 mEq/L (ref 19–32)
Calcium: 9.7 mg/dL (ref 8.4–10.5)
Chloride: 102 mEq/L (ref 96–112)
Creatinine, Ser: 0.79 mg/dL (ref 0.40–1.20)
GFR: 68.5 mL/min (ref >60.00)
Glucose, Bld: 92 mg/dL (ref 70–99)
Potassium: 4.7 mEq/L (ref 3.5–5.1)
Sodium: 136 mEq/L (ref 135–145)
Total Bilirubin: 0.5 mg/dL (ref 0.2–1.2)
Total Protein: 6 g/dL (ref 6.0–8.3)

## 2022-01-16 LAB — TSH: TSH: 1.16 u[IU]/mL (ref 0.35–5.50)

## 2022-01-21 ENCOUNTER — Other Ambulatory Visit: Payer: Self-pay | Admitting: Family Medicine

## 2022-01-23 ENCOUNTER — Ambulatory Visit (INDEPENDENT_AMBULATORY_CARE_PROVIDER_SITE_OTHER): Payer: Medicare Other | Admitting: Family Medicine

## 2022-01-23 ENCOUNTER — Other Ambulatory Visit: Payer: Self-pay

## 2022-01-23 VITALS — BP 126/78 | HR 75 | Temp 97.9°F | Resp 16 | Ht 64.5 in | Wt 185.4 lb

## 2022-01-23 DIAGNOSIS — Z1231 Encounter for screening mammogram for malignant neoplasm of breast: Secondary | ICD-10-CM

## 2022-01-23 DIAGNOSIS — E7800 Pure hypercholesterolemia, unspecified: Secondary | ICD-10-CM

## 2022-01-23 DIAGNOSIS — M059 Rheumatoid arthritis with rheumatoid factor, unspecified: Secondary | ICD-10-CM

## 2022-01-23 DIAGNOSIS — E2839 Other primary ovarian failure: Secondary | ICD-10-CM

## 2022-01-23 DIAGNOSIS — I10 Essential (primary) hypertension: Secondary | ICD-10-CM | POA: Diagnosis not present

## 2022-01-23 DIAGNOSIS — E538 Deficiency of other specified B group vitamins: Secondary | ICD-10-CM

## 2022-01-23 MED ORDER — AMITRIPTYLINE HCL 10 MG PO TABS: 10.0000 mg | ORAL_TABLET | Freq: Every day | ORAL | 3 refills | Status: DC

## 2022-01-23 MED ORDER — ATORVASTATIN CALCIUM 10 MG PO TABS: 10.0000 mg | ORAL_TABLET | Freq: Every day | ORAL | 3 refills | Status: DC

## 2022-01-23 MED ORDER — TRIAMCINOLONE ACETONIDE 55 MCG/ACT NA AERO: 2.0000 | INHALATION_SPRAY | Freq: Every day | NASAL | 12 refills | Status: AC

## 2022-01-23 MED ORDER — CYANOCOBALAMIN 1000 MCG/ML IJ SOLN
1000.0000 ug | Freq: Once | INTRAMUSCULAR | Status: AC
  Administered 2022-01-23: 1000 ug via INTRAMUSCULAR

## 2022-01-23 MED ORDER — IRBESARTAN 300 MG PO TABS: ORAL_TABLET | ORAL | 3 refills | Status: DC

## 2022-01-23 NOTE — Progress Notes (Signed)
Subjective:    Patient ID: Tammy Manning, female    DOB: [birth year], 87 y.o.   MRN: [id number]  This visit occurred during the SARS-CoV-2 public health emergency.  Safety protocols were in place, including screening questions prior to the visit, additional usage of staff PPE, and extensive cleaning of exam room while observing appropriate contact time as indicated for disinfecting solutions.   HPI Pt presents for annual f/u of chronic health problems   Wt Readings from Last 3 Encounters:  01/23/22 185 lb 6.4 oz (84.1 kg)  01/09/22 184 lb 2 oz (83.5 kg)  12/29/21 191 lb (86.6 kg)   31.33 kg/m  Doing better  Taking care of herself  Putting herself first  Letting herself rest and goes to bed when she wants to  Going for walks   Taking a break from painting and art    AMW reviewed from 12/24/21  Zoster status =had shingles recently -getting over it  On mtx for RA     Covid immunized  Td 2014 Flu shot 08/2021 Pna vaccine utd  Mammogram 12/2020 Self breast exam : no lumps   Dexa 2021  mild osteopenia  No falls or fx  Walking  Takes vitamin D   No longer on ppi  (zantac instead)  Has to watch her diet   Could not take folic acid -made her stomach hurt     Colonoscopy 2019   Since shingles- has to concentrate harder with math    HTN in setting of a fib  bp is stable today  No cp or palpitations or headaches or edema  No side effects to medicines  BP Readings from Last 3 Encounters:  01/23/22 126/78  01/09/22 130/70  12/29/21 (!) 165/63     Pulse Readings from Last 3 Encounters:  01/23/22 75  01/09/22 88  12/29/21 80    Diltiazem 240 mg daily  Avapro 300 mg daily  No longer feels dizzy   Seeing rheumatology for RA  Vit B12 def  Lab Results  Component Value Date   VITAMINB12 487 01/16/2022   Gets a shot monthly   Lab Results  Component Value Date   CREATININE 0.79 01/16/2022   BUN 16 01/16/2022   NA 136 01/16/2022   K 4.7 01/16/2022    CL 102 01/16/2022   CO2 31 01/16/2022   Lab Results  Component Value Date   ALT 17 01/16/2022   AST 13 01/16/2022   ALKPHOS 53 01/16/2022   BILITOT 0.5 01/16/2022      Hyperlipidemia  Lab Results  Component Value Date   CHOL 199 01/16/2022   CHOL 201 (H) 04/25/2020   CHOL 196 02/02/2020   Lab Results  Component Value Date   HDL 59.40 01/16/2022   HDL 66.80 04/25/2020   HDL 80 02/02/2020   Lab Results  Component Value Date   LDLCALC 108 (H) 01/16/2022   LDLCALC 107 (H) 04/25/2020   LDLCALC 93 02/02/2020   Lab Results  Component Value Date   TRIG 157.0 (H) 01/16/2022   TRIG 132.0 04/25/2020   TRIG 136 02/02/2020   Lab Results  Component Value Date   CHOLHDL 3 01/16/2022   CHOLHDL 3 04/25/2020   CHOLHDL 2.5 02/02/2020   Lab Results  Component Value Date   LDLDIRECT 106 (H) 02/02/2020   LDLDIRECT 105.9 02/08/2013   LDLDIRECT 125.0 09/12/2012    Atorvastatin 10 mg  Diet is "fine"  Has not changed anything but she does eat out a bit  more   Cbc is fairly stable Lab Results  Component Value Date   WBC 6.1 01/16/2022   HGB 11.3 (L) 01/16/2022   HCT 34.5 (L) 01/16/2022   MCV 94.5 01/16/2022   PLT 335.0 01/16/2022    Patient Active Problem List   Diagnosis Date Noted   Encounter for screening mammogram for breast cancer 01/23/2022   Recurrent UTI 11/25/2021   High risk medication use 10/07/2021   Atrial fibrillation with RVR ([place]) 04/28/2021   Hypertrophic toenail 04/22/2021   Pedal edema 03/24/2021   Seropositive rheumatoid arthritis ([place]) 11/25/2020   Bilateral hand pain 11/25/2020   Bilateral shoulder pain 10/16/2020   Joint pain 10/16/2020   Estrogen deficiency 04/30/2020   Dry mouth 03/15/2020   Osteoarthritis of right hip 01/02/2020   Groin pain, right 01/01/2020   Right thyroid nodule 05/12/2018   History of TIA (transient ischemic attack) 05/09/2018   Aortic insufficiency 09/10/2015   Stress reaction 09/08/2015   Encounter for  Medicare annual wellness exam 02/28/2013   Bucket-handle tear of lateral meniscus of left knee as current injury 12/30/2012   Episodic atrial fibrillation ([place]) 11/17/2012   Special screening for malignant neoplasms, colon 01/21/2012   Hearing loss of both ears 01/14/2012   HEADACHE, CHRONIC 09/24/2010   B12 deficiency 03/17/2010   GERD (gastroesophageal reflux disease) 03/17/2010   POSTMENOPAUSAL STATUS 08/20/2008   Essential hypertension 07/19/2007   Hyperlipidemia 06/24/2007   IBS 06/24/2007   DERMATITIS, ATOPIC 06/24/2007   SKIN CANCER, HX OF 06/24/2007   Past Medical History:  Diagnosis Date   A-fib ([place])    Anginal pain ([place])    Aortic insufficiency    a. 02/2020 Echo: EF 60-65%, no rwma, Gr1 DD, nl RV fxn, mildly dil LA, triv AI, Asc Ao 67m; b. 04/2021 Echo: EF 60-65%, no rwma, GrI DD, nl RV fxn, mildly dil LA, AI not visualized. Mild-mod AoV sclerosis w/o stenosis.   Arthritis    Knees   B12 deficiency    Mild   Bucket-handle tear of lateral meniscus of left knee as current injury 12/30/2012   [place]cer [place]    Skin, basal cell on nose and eyelid   DJD (degenerative joint disease)    Low back   Dyspepsia    GERD (gastroesophageal reflux disease)    Heart murmur    Heart palpitations    History of cardiac cath    a. 02/2017 Cath: Nl cors. Nl LV fxn. Abd Aogram w/o RAS.   HOH (hard of hearing)    Hyperlipidemia    Hypertension    IBS (irritable bowel syndrome)    Lactose intolerance    Mixed incontinence    PONV (postoperative nausea and vomiting)    Vulvar irritation    from urine pad, uses Triamcinolone oint.   Past Surgical History:  Procedure Laterality Date   ABDOMINAL AORTOGRAM N/A 03/15/2017   Procedure: Abdominal Aortogram;  Surgeon: MWellington Hampshire MD;  Location: [facility];  Service: Cardiovascular;  Laterality: N/A;   ABDOMINAL HYSTERECTOMY  1982   Large fibroids and bleeding   APPENDECTOMY     BILATERAL SALPINGOOPHORECTOMY  1986   BONE  GRAFT HIP ILIAC CREST     To Left arm   BREAST REDUCTION SURGERY     BREAST SURGERY     CARDIAC CATHETERIZATION     CATARACT EXTRACTION W/PHACO Left 07/07/2016   Procedure: CATARACT EXTRACTION PHACO AND INTRAOCULAR LENS PLACEMENT (IOC);  Surgeon: WBirder Robson MD;  Location: [facility];  Service: Ophthalmology;  Laterality: Left;  [place] 01:10 AP% 18.3 CDE 12.91 Fluid pak lot # [id number]   CATARACT EXTRACTION W/PHACO Right 07/28/2016   Procedure: CATARACT EXTRACTION PHACO AND INTRAOCULAR LENS PLACEMENT (IOC);  Surgeon: Birder Robson, MD;  Location: [facility];  Service: Ophthalmology;  Laterality: Right;  [place] 01:26 AP% 18.5 CDE 16.12 Fluid pack lot # [id number]   COLONOSCOPY  07/2016   Dr. Thana Farr   DILATION AND CURETTAGE OF UTERUS     miscarragesx2   ESOPHAGOGASTRODUODENOSCOPY     Polyps   HEMORRHOID SURGERY  2006   KNEE ARTHROSCOPY WITH LATERAL MENISECTOMY Left 12/30/2012   Procedure: KNEE ARTHROSCOPY WITH LATERAL MENISECTOMY;  Surgeon: Johnny Bridge, MD;  Location: [facility];  Service: Orthopedics;  Laterality: Left;  LEFT KNEE SCOPE LATERAL MENISCECTOMY   LEFT HEART CATH AND CORONARY ANGIOGRAPHY N/A 03/15/2017   Procedure: Left Heart Cath and Coronary Angiography;  Surgeon: Wellington Hampshire, MD;  Location: [facility];  Service: Cardiovascular;  Laterality: N/A;   MOUTH SURGERY     skin cancer eyelid  2012   TONSILLECTOMY     Social History   Tobacco Use   Smoking status: Former    Packs/day: 1.00    Years: 12.00    Pack years: 12.00    Types: Cigarettes    Quit date: 11/24/1963    Years since quitting: 58.2   Smokeless tobacco: Never  Vaping Use   Vaping Use: Never used  Substance Use Topics   Alcohol use: No    Alcohol/week: 0.0 standard drinks   Drug use: No   Family History  Problem Relation Age of Onset   Cancer Mother        Colon   Heart disease Father        CAD   Diabetes Sister    Hypothyroidism Sister    Diabetes Brother    Leukemia  Brother    Esophageal cancer Brother    Cancer Paternal Aunt        Breast   Diabetes Sister    Alzheimer's disease Sister    Diabetes Brother    Breast cancer Daughter    Allergies  Allergen Reactions   Antihistamines, Chlorpheniramine-Type Other (See Comments)    Reaction:  Makes pt hyper    Atenolol Hypertension   Cefuroxime Axetil Other (See Comments)    Upset stomach   Ciprofloxacin Other (See Comments)    Upset stomach   Co Q 10 [Coenzyme Q10] Other (See Comments)    Reaction:  GI upset    Crestor [Rosuvastatin Calcium] Other (See Comments)    Reaction:  Myalgias    Dextromethorphan-Guaifenesin Other (See Comments)    Reaction:  Made pt jittery    Epinephrine Hives   Ramipril Other (See Comments)    Upset stomach   Sulfamethoxazole-Trimethoprim Other (See Comments)    Upset stomach   Vitamin D Analogs Other (See Comments)    Reaction:  GI upset    Current Outpatient Medications on File Prior to Visit  Medication Sig Dispense Refill   acetaminophen (TYLENOL) 500 MG tablet Take 500 mg by mouth every 6 (six) hours as needed.     apixaban (ELIQUIS) 5 MG TABS tablet TAKE 1 TABLET(5 MG) BY MOUTH TWICE DAILY 180 tablet 1   Calcium Carbonate-Vitamin D (CALCIUM 600+D PO) Take 1 tablet by mouth daily.      clobetasol ointment (TEMOVATE) 3.81 % Apply 1 application topically 2 (two) times daily as needed.  clorazepate (TRANXENE) 7.5 MG tablet Take 1 tablet (7.5 mg total) by mouth daily as needed. 30 tablet 1   cyanocobalamin (,VITAMIN B-12,) 1000 MCG/ML injection Inject 1,000 mcg into the muscle every 30 (thirty) days.      diltiazem (CARDIZEM CD) 180 MG 24 hr capsule Take 1 capsule (180 mg total) by mouth daily. 90 capsule 3   guaiFENesin (MUCINEX) 600 MG 12 hr tablet Take 600 mg by mouth 2 (two) times daily as needed.     loperamide (IMODIUM) 2 MG capsule Take 2 mg by mouth as needed for diarrhea or loose stools.     MAGNESIUM LACTATE PO Take by mouth 2 (two) times daily.      Methotrexate Sodium (METHOTREXATE, PF,) 50 MG/2ML injection INJECT 0.6 ML (15MG) SUBCUTANEOUSLY ONCEA WEEK 4 mL 1   raNITIdine HCl (ZANTAC PO) Take by mouth.     TUBERCULIN SYR 1CC/26GX3/8" 26G X 3/8" 1 ML MISC For injection of subcutaneous methotrexate once weekly 25 each 0   No current facility-administered medications on file prior to visit.     Review of Systems  Constitutional:  Negative for activity change, appetite change, fatigue, fever and unexpected weight change.  HENT:  Negative for congestion, ear pain, rhinorrhea, sinus pressure and sore throat.   Eyes:  Negative for pain, redness and visual disturbance.  Respiratory:  Negative for cough, shortness of breath and wheezing.   Cardiovascular:  Negative for chest pain and palpitations.  Gastrointestinal:  Negative for abdominal pain, blood in stool, constipation and diarrhea.  Endocrine: Negative for polydipsia and polyuria.  Genitourinary:  Negative for dysuria, frequency and urgency.  Musculoskeletal:  Positive for arthralgias. Negative for back pain and myalgias.  Skin:  Negative for pallor and rash.  Allergic/Immunologic: Negative for environmental allergies.  Neurological:  Negative for dizziness, syncope and headaches.  Hematological:  Negative for adenopathy. Does not bruise/bleed easily.  Psychiatric/Behavioral:  Negative for decreased concentration and dysphoric mood. The patient is not nervous/anxious.       Objective:   Physical Exam Constitutional:      General: She is not in acute distress.    Appearance: Normal appearance. She is well-developed. She is obese. She is not ill-appearing or diaphoretic.  HENT:     Head: Normocephalic and atraumatic.     Right Ear: Tympanic membrane, ear canal and external ear normal.     Left Ear: Tympanic membrane, ear canal and external ear normal.     Nose: Nose normal. No congestion.     Mouth/Throat:     Mouth: Mucous membranes are moist.     Pharynx: Oropharynx is  clear. No posterior oropharyngeal erythema.  Eyes:     General: No scleral icterus.    Extraocular Movements: Extraocular movements intact.     Conjunctiva/sclera: Conjunctivae normal.     Pupils: Pupils are equal, round, and reactive to light.  Neck:     Thyroid: No thyromegaly.     Vascular: No carotid bruit or JVD.  Cardiovascular:     Rate and Rhythm: Normal rate and regular rhythm.     Pulses: Normal pulses.     Heart sounds: Normal heart sounds.    No gallop.  Pulmonary:     Effort: Pulmonary effort is normal. No respiratory distress.     Breath sounds: Normal breath sounds. No wheezing.     Comments: Good air exch Chest:     Chest wall: No tenderness.  Abdominal:     General: Bowel sounds are normal.  There is no distension or abdominal bruit.     Palpations: Abdomen is soft. There is no mass.     Tenderness: There is no abdominal tenderness.     Hernia: No hernia is present.  Genitourinary:    Comments: Breast exam: No mass, nodules, thickening, tenderness, bulging, retraction, inflamation, nipple discharge or skin changes noted.  No axillary or clavicular LA.     Musculoskeletal:        General: No tenderness. Normal range of motion.     Cervical back: Normal range of motion and neck supple. No rigidity. No muscular tenderness.     Right lower leg: No edema.     Left lower leg: No edema.     Comments: No kyphosis   Lymphadenopathy:     Cervical: No cervical adenopathy.  Skin:    General: Skin is warm and dry.     Coloration: Skin is not pale.     Findings: No erythema or rash.     Comments: Solar lentigines diffusely Some sks   Neurological:     Mental Status: She is alert. Mental status is at baseline.     Cranial Nerves: No cranial nerve deficit.     Motor: No abnormal muscle tone.     Coordination: Coordination normal.     Gait: Gait normal.     Deep Tendon Reflexes: Reflexes are normal and symmetric. Reflexes normal.  Psychiatric:        Mood and Affect:  Mood normal.        Cognition and Memory: Cognition and memory normal.          Assessment & Plan:   Problem List Items Addressed This Visit       Cardiovascular and Mediastinum   Essential hypertension - Primary    In setting of a fib   bp in fair control at this time  BP Readings from Last 1 Encounters:  01/23/22 126/78   No changes needed Most recent labs reviewed  Disc lifstyle change with low sodium diet and exercise  Plan to continue Diltiazem 240 mg daily  avapro 300 mg daily  Rate is controlled       Relevant Medications   atorvastatin (LIPITOR) 10 MG tablet   irbesartan (AVAPRO) 300 MG tablet     Musculoskeletal and Integument   Seropositive rheumatoid arthritis ([place])    Doing better under care of rheumatology Taking methotrexate        Other   B12 deficiency    Monthly shot today  Lab Results  Component Value Date   [id number] 096 01/16/2022         Encounter for screening mammogram for breast cancer    ordered annual screening mammogram Nl breast exam today  Encouraged monthly self exams        Relevant Orders   MM 3D SCREEN BREAST BILATERAL   Estrogen deficiency   Hyperlipidemia    Disc goals for lipids and reasons to control them Rev last labs with pt Rev low sat fat diet in detail Plan to continue atorvastatin 10 mg daily  LDL of 108 Eating out more       Relevant Medications   atorvastatin (LIPITOR) 10 MG tablet   irbesartan (AVAPRO) 300 MG tablet

## 2022-01-23 NOTE — Patient Instructions (Addendum)
For cholesterol ?Avoid red meat/ fried foods/ egg yolks/ fatty breakfast meats/ butter, cheese and high fat dairy/ and shellfish   ? ?Stay active/ brain and body  ?Keep reading  ? ? ? ?Please call and schedule your mammogram ? ? ?Please call the location of your choice from the menu below to schedule your Mammogram and/or Bone Density appointment.   ? ?[place]  ? ?[facility]                ?      Phone:  [phone] ?[address]                               ?[place], [place] [address]                                                             ?Services: Traditional and 3D Mammogram, Bone Density  ? ?[facility]           ?      Phone: [phone] ?[address]                                                       ?[place], [place] [address]    ?Service: Bone Density ONLY  ? *this site does NOT perform mammograms ? ?[facility]                       ? Phone:  [phone] ?[address]                                  ?[place], [place] [address]                                            ?Services:  3D Mammogram and Bone Density  ? ? ? ? ?[place] [facility]   ?Phone:  [phone]   ?[place], [place] [address]                                            ?Services: 3D Mammogram and Bone Density ? ?[facility] [facility])  ?Phone:  [phone]   ?[address] Room  [place]                        ?[place], [place] [address]                                              ?Services:  3D Mammogram and Bone Density ? ?

## 2022-01-25 ENCOUNTER — Encounter: Payer: Self-pay | Admitting: Family Medicine

## 2022-01-25 NOTE — Assessment & Plan Note (Signed)
In setting of a fib  ? ?bp in fair control at this time  ?BP Readings from Last 1 Encounters:  ?01/23/22 126/78  ? ?No changes needed ?Most recent labs reviewed  ?Disc lifstyle change with low sodium diet and exercise  ?Plan to continue ?Diltiazem 240 mg daily  ?avapro 300 mg daily  ?Rate is controlled  ?

## 2022-01-25 NOTE — Assessment & Plan Note (Signed)
Doing better under care of rheumatology ?Taking methotrexate ?

## 2022-01-25 NOTE — Assessment & Plan Note (Addendum)
ordered annual screening mammogram ?Nl breast exam today  ?Encouraged monthly self exams  ? ? ?

## 2022-01-25 NOTE — Assessment & Plan Note (Signed)
Monthly shot today  ?Lab Results  ?Component Value Date  ? [id number] 487 01/16/2022  ? ? ?

## 2022-01-25 NOTE — Assessment & Plan Note (Signed)
Disc goals for lipids and reasons to control them ?Rev last labs with pt ?Rev low sat fat diet in detail ?Plan to continue atorvastatin 10 mg daily  ?LDL of 108 ?Eating out more  ?

## 2022-01-26 DIAGNOSIS — M542 Cervicalgia: Secondary | ICD-10-CM | POA: Diagnosis not present

## 2022-02-11 DIAGNOSIS — M1711 Unilateral primary osteoarthritis, right knee: Secondary | ICD-10-CM | POA: Diagnosis not present

## 2022-02-16 ENCOUNTER — Other Ambulatory Visit: Payer: Self-pay | Admitting: Family Medicine

## 2022-02-19 ENCOUNTER — Encounter: Payer: Self-pay | Admitting: Family Medicine

## 2022-02-19 MED ORDER — CLORAZEPATE DIPOTASSIUM 7.5 MG PO TABS: 7.5000 mg | ORAL_TABLET | Freq: Every day | ORAL | 3 refills | Status: DC | PRN

## 2022-02-19 NOTE — Telephone Encounter (Signed)
Name of Medication: Clorazepate ?Name of Pharmacy: [facility] or Written Date and Quantity:10/20/21 #30 tabs / 1 refills  ?Last Office Visit and Type: CPE on 01/23/22 ?Next Office Visit and Type: none scheduled  ?

## 2022-02-24 ENCOUNTER — Ambulatory Visit (INDEPENDENT_AMBULATORY_CARE_PROVIDER_SITE_OTHER): Payer: Medicare Other | Admitting: Endocrinology

## 2022-02-24 ENCOUNTER — Encounter: Payer: Self-pay | Admitting: Endocrinology

## 2022-02-24 VITALS — BP 134/60 | HR 84 | Ht 64.5 in | Wt 185.6 lb

## 2022-02-24 DIAGNOSIS — E041 Nontoxic single thyroid nodule: Secondary | ICD-10-CM

## 2022-02-24 DIAGNOSIS — E042 Nontoxic multinodular goiter: Secondary | ICD-10-CM

## 2022-02-24 NOTE — Patient Instructions (Signed)
Let's recheck the ultrasound.  you will receive a phone call, about a day and time for an appointment.   ?please come back for a follow-up appointment in 1 year.   ?

## 2022-02-24 NOTE — Progress Notes (Signed)
? ?Subjective:  ? ? Patient ID: Tammy Manning, female    DOB: 25-May-1935, 87 y.o.   MRN: [id number] ? ?HPI ?Pt returns for f/u of MNG (dx'ed 2019; [place] then showed 3.1 cm right inferior nodule meets criteria for biopsy; US-guided bxs showed (CATEGORY I), and (later) BENIGN FOLLICULAR NODULE (CATEGORY II); TSH is mid-normal off any rx; f/u US in 2021 shows annual f/u needed).  She does not notice the goiter.  pt states she feels well in general.    ?Past Medical History:  ?Diagnosis Date  ? A-fib ([place])   ? Anginal pain ([place])   ? Aortic insufficiency   ? a. 02/2020 Echo: EF 60-65%, no rwma, Gr1 DD, nl RV fxn, mildly dil LA, triv AI, Asc Ao 43m; b. 04/2021 Echo: EF 60-65%, no rwma, GrI DD, nl RV fxn, mildly dil LA, AI not visualized. Mild-mod AoV sclerosis w/o stenosis.  ? Arthritis   ? Knees  ? B12 deficiency   ? Mild  ? Bucket-handle tear of lateral meniscus of left knee as current injury 12/30/2012  ? Cancer ([facility]   ? Skin, basal cell on nose and eyelid  ? DJD (degenerative joint disease)   ? Low back  ? Dyspepsia   ? GERD (gastroesophageal reflux disease)   ? Heart murmur   ? Heart palpitations   ? History of cardiac cath   ? a. 02/2017 Cath: Nl cors. Nl LV fxn. Abd Aogram w/o RAS.  ? HOH (hard of hearing)   ? Hyperlipidemia   ? Hypertension   ? IBS (irritable bowel syndrome)   ? Lactose intolerance   ? Mixed incontinence   ? PONV (postoperative nausea and vomiting)   ? Vulvar irritation   ? from urine pad, uses Triamcinolone oint.  ? ? ?Past Surgical History:  ?Procedure Laterality Date  ? ABDOMINAL AORTOGRAM N/A 03/15/2017  ? Procedure: Abdominal Aortogram;  Surgeon: MWellington Hampshire MD;  [facility];  Service: Cardiovascular;  Laterality: N/A;  ? ABDOMINAL HYSTERECTOMY  1982  ? Large fibroids and bleeding  ? APPENDECTOMY    ? BILATERAL SALPINGOOPHORECTOMY  1986  ? BONE GRAFT HIP ILIAC CREST    ? To Left arm  ? BREAST REDUCTION SURGERY    ? BREAST SURGERY    ? CARDIAC CATHETERIZATION    ? CATARACT  EXTRACTION W/PHACO Left 07/07/2016  ? Procedure: CATARACT EXTRACTION PHACO AND INTRAOCULAR LENS PLACEMENT (IOC);  Surgeon: WBirder Robson MD;  Location: [facility];  Service: Ophthalmology;  Laterality: Left;  [place]01:10 ?AP% 18.3 ?CDE 12.91 ?Fluid pak lot # [id number]  ? CATARACT EXTRACTION W/PHACO Right 07/28/2016  ? Procedure: CATARACT EXTRACTION PHACO AND INTRAOCULAR LENS PLACEMENT (IOC);  Surgeon: WBirder Robson MD;  Location: [facility];  Service: Ophthalmology;  Laterality: Right;  [place]01:26 ?AP% 18.5 ?CDE 16.12 ?Fluid pack lot # [id number]  ? COLONOSCOPY  07/2016  ? Dr. MThana Farr ? DILATION AND CURETTAGE OF UTERUS    ? miscarragesx2  ? ESOPHAGOGASTRODUODENOSCOPY    ? Polyps  ? HEMORRHOID SURGERY  2006  ? KNEE ARTHROSCOPY WITH LATERAL MENISECTOMY Left 12/30/2012  ? Procedure: KNEE ARTHROSCOPY WITH LATERAL MENISECTOMY;  Surgeon: JJohnny Bridge MD;  [place] [facility]  Service: Orthopedics;  Laterality: Left;  LEFT KNEE SCOPE LATERAL MENISCECTOMY  ? LEFT HEART CATH AND CORONARY ANGIOGRAPHY N/A 03/15/2017  ? Procedure: Left Heart Cath and Coronary Angiography;  Surgeon: MWellington Hampshire MD;  [facility];  Service: Cardiovascular;  Laterality: N/A;  ? MOUTH SURGERY    ? skin cancer eyelid  2012  ? TONSILLECTOMY    ? ? ?Social History  ? ?Socioeconomic History  ? Marital status: Married  ?  Spouse name: Not on file  ? Number of children: 2  ? Years of education: Not on file  ? Highest education level: Not on file  ?Occupational History  ? Occupation: Proofreader  ?  Employer: RETIRED  ?Tobacco Use  ? Smoking status: Former  ?  Packs/day: 1.00  ?  Years: 12.00  ?  Pack years: 12.00  ?  Types: Cigarettes  ?  Quit date: 11/24/1963  ?  Years since quitting: 58.3  ? Smokeless tobacco: Never  ?Vaping Use  ? Vaping Use: Never used  ?Substance and Sexual Activity  ? Alcohol use: No  ?  Alcohol/week: 0.0 standard drinks  ? Drug use: No  ? Sexual activity: Yes  ?Other Topics Concern  ? Not on  file  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    ? ?Social Determinants of Health  ? ?Financial Resource Strain: Low Risk   ? Difficulty of Paying Living Expenses: Not hard at all  ?Food Insecurity: No Food Insecurity  ? Worried About Charity fundraiser in the Last Year: Never true  ? Ran Out of Food in the Last Year: Never true  ?Transportation Needs: No Transportation Needs  ? Lack of Transportation (Medical): No  ? Lack of Transportation (Non-Medical): No  ?Physical Activity: Insufficiently Active  ? Days of Exercise per Week: 7 days  ? Minutes of Exercise per Session: 10 min  ?Stress: No Stress Concern Present  ? Feeling of Stress : Only a little  ?Social Connections: Moderately Isolated  ? Frequency of Communication with Friends and Family: More than three times a week  ? Frequency of Social Gatherings with Friends and Family: More than three times a week  ? Attends Religious Services: Never  ? Active Member of Clubs or Organizations: No  ? Attends Archivist Meetings: Never  ? Marital Status: Married  ?Intimate Partner Violence: Not At Risk  ? Fear of Current or Ex-Partner: No  ? Emotionally Abused: No  ? Physically Abused: No  ? Sexually Abused: No  ? ? ?Current Outpatient Medications on File Prior to Visit  ?Medication Sig Dispense Refill  ? acetaminophen (TYLENOL) 500 MG tablet Take 500 mg by mouth every 6 (six) hours as needed.    ? amitriptyline (ELAVIL) 10 MG tablet Take 1 tablet (10 mg total) by mouth at bedtime. 90 tablet 3  ? apixaban (ELIQUIS) 5 MG TABS tablet TAKE 1 TABLET(5 MG) BY MOUTH TWICE DAILY 180 tablet 1  ? atorvastatin (LIPITOR) 10 MG tablet Take 1 tablet (10 mg total) by mouth daily. 90 tablet 3  ? Calcium Carbonate-Vitamin D (CALCIUM 600+D PO) Take 1 tablet by mouth daily.     ? clobetasol ointment (TEMOVATE) 3.09 % Apply 1 application topically 2 (two) times daily as needed.    ? clorazepate (TRANXENE) 7.5 MG tablet Take 1 tablet (7.5 mg total) by mouth daily as needed. 30  tablet 3  ? cyanocobalamin (,VITAMIN B-12,) 1000 MCG/ML injection Inject 1,000 mcg into the muscle every 30 (thirty) days.     ? guaiFENesin (MUCINEX) 600 MG 12 hr tablet Take 600 mg by mouth 2 (two) times daily as needed.    ? irbesartan (AVAPRO) 300 MG tablet TAKE 1 TABLET BY MOUTH DAILY 90 tablet 3  ?  loperamide (IMODIUM) 2 MG capsule Take 2 mg by mouth as needed for diarrhea or loose stools.    ? MAGNESIUM LACTATE PO Take by mouth 2 (two) times daily.    ? Methotrexate Sodium (METHOTREXATE, PF,) 50 MG/2ML injection INJECT 0.6 ML (15MG) SUBCUTANEOUSLY ONCEA WEEK 4 mL 1  ? raNITIdine HCl (ZANTAC PO) Take by mouth.    ? triamcinolone (NASACORT) 55 MCG/ACT AERO nasal inhaler Place 2 sprays into the nose daily. 1 each 12  ? TUBERCULIN SYR 1CC/26GX3/8" 26G X 3/8" 1 ML MISC For injection of subcutaneous methotrexate once weekly 25 each 0  ? diltiazem (CARDIZEM CD) 180 MG 24 hr capsule Take 1 capsule (180 mg total) by mouth daily. 90 capsule 3  ? ?No current facility-administered medications on file prior to visit.  ? ? ?Allergies  ?Allergen Reactions  ? Antihistamines, Chlorpheniramine-Type Other (See Comments)  ?  Reaction:  Makes pt hyper   ? Atenolol Hypertension  ? Cefuroxime Axetil Other (See Comments)  ?  Upset stomach  ? Ciprofloxacin Other (See Comments)  ?  Upset stomach  ? Co Q 10 [Coenzyme Q10] Other (See Comments)  ?  Reaction:  GI upset   ? Crestor [Rosuvastatin Calcium] Other (See Comments)  ?  Reaction:  Myalgias ?  ? Dextromethorphan-Guaifenesin Other (See Comments)  ?  Reaction:  Made pt jittery   ? Epinephrine Hives  ? Ramipril Other (See Comments)  ?  Upset stomach  ? Sulfamethoxazole-Trimethoprim Other (See Comments)  ?  Upset stomach  ? Vitamin D Analogs Other (See Comments)  ?  Reaction:  GI upset   ? ? ?Family History  ?Problem Relation Age of Onset  ? Cancer Mother   ?     Colon  ? Heart disease Father   ?     CAD  ? Diabetes Sister   ? Hypothyroidism Sister   ? Diabetes Brother   ? Leukemia  Brother   ? Esophageal cancer Brother   ? Cancer Paternal Aunt   ?     Breast  ? Diabetes Sister   ? Alzheimer's disease Sister   ? Diabetes Brother   ? Breast cancer Daughter   ? ? ?BP 134/60 (BP Locati

## 2022-02-26 ENCOUNTER — Other Ambulatory Visit: Payer: Self-pay | Admitting: Family Medicine

## 2022-02-27 DIAGNOSIS — E042 Nontoxic multinodular goiter: Secondary | ICD-10-CM | POA: Insufficient documentation

## 2022-03-04 ENCOUNTER — Ambulatory Visit (INDEPENDENT_AMBULATORY_CARE_PROVIDER_SITE_OTHER): Payer: Medicare Other | Admitting: Urology

## 2022-03-04 ENCOUNTER — Encounter: Payer: Self-pay | Admitting: Urology

## 2022-03-04 VITALS — BP 111/69 | HR 81 | Ht 64.0 in | Wt 182.0 lb

## 2022-03-04 DIAGNOSIS — N390 Urinary tract infection, site not specified: Secondary | ICD-10-CM

## 2022-03-04 LAB — URINALYSIS, COMPLETE
Bilirubin, UA: NEGATIVE
Glucose, UA: NEGATIVE
Ketones, UA: NEGATIVE
Nitrite, UA: POSITIVE — AB
Protein,UA: NEGATIVE
RBC, UA: NEGATIVE
Specific Gravity, UA: 1.015 (ref 1.005–1.030)
Urobilinogen, Ur: 0.2 mg/dL (ref 0.2–1.0)
pH, UA: 6.5 (ref 5.0–7.5)

## 2022-03-04 LAB — MICROSCOPIC EXAMINATION

## 2022-03-04 NOTE — Progress Notes (Signed)
? ?03/04/2022 ?8:52 AM  ? ?Tammy Manning ?09/11/1935 ?[id number] ? ?Referring provider: Abner Greenspan, MD ?[address] ?[place],  [place] [address] ? ?Chief Complaint  ?Patient presents with  ? Recurrent UTI  ? ? ?HPI: ?87 y.o. female presents for 71-monthfollow-up visit ? ?Initially seen 12/01/2021 ?Started on low-dose antibiotic suppression with Keflex and was switched to nitrofurantoin by our PA due to GI side effects with Keflex ?No UTIs since last visit ?Is taking d-mannose and low-dose vaginal estrogen ?Asymptomatic today ? ? ?PMH: ?Past Medical History:  ?Diagnosis Date  ? [place] ([place]   ? [place]inal pain ([facility]   ? Aortic insufficiency   ? a. 02/2020 Echo: EF 60-65%, no rwma, Gr1 DD, nl RV fxn, mildly dil LA, triv AI, Asc Ao 319m b. 04/2021 Echo: EF 60-65%, no rwma, GrI DD, nl RV fxn, mildly dil LA, AI not visualized. Mild-mod AoV sclerosis w/o stenosis.  ? Arthritis   ? Knees  ? B12 deficiency   ? Mild  ? Bucket-handle tear of lateral meniscus of left knee as current injury 12/30/2012  ? [facility]  ? Skin, basal cell on nose and eyelid  ? DJD (degenerative joint disease)   ? Low back  ? Dyspepsia   ? GERD (gastroesophageal reflux disease)   ? Heart murmur   ? Heart palpitations   ? History of cardiac cath   ? a. 02/2017 Cath: Nl cors. Nl LV fxn. Abd Aogram w/o RAS.  ? HOH (hard of hearing)   ? Hyperlipidemia   ? Hypertension   ? IBS (irritable bowel syndrome)   ? Lactose intolerance   ? Mixed incontinence   ? PONV (postoperative nausea and vomiting)   ? Vulvar irritation   ? from urine pad, uses Triamcinolone oint.  ? ? ?Surgical History: ?Past Surgical History:  ?Procedure Laterality Date  ? ABDOMINAL AORTOGRAM N/A 03/15/2017  ? Procedure: Abdominal Aortogram;  Surgeon: MuWellington HampshireMD;  [facility];  Service: Cardiovascular;  Laterality: N/A;  ? ABDOMINAL HYSTERECTOMY  1982  ? Large fibroids and bleeding  ? APPENDECTOMY    ? BILATERAL SALPINGOOPHORECTOMY  1986  ? BONE GRAFT HIP  ILIAC CREST    ? To Left arm  ? BREAST REDUCTION SURGERY    ? BREAST SURGERY    ? CARDIAC CATHETERIZATION    ? CATARACT EXTRACTION W/PHACO Left 07/07/2016  ? Procedure: CATARACT EXTRACTION PHACO AND INTRAOCULAR LENS PLACEMENT (IOC);  Surgeon: WiBirder RobsonMD;  Location: [facility];  Service: Ophthalmology;  Laterality: Left;  [place]1:10 ?AP% 18.3 ?CDE 12.91 ?Fluid pak lot # [id number]  ? CATARACT EXTRACTION W/PHACO Right 07/28/2016  ? Procedure: CATARACT EXTRACTION PHACO AND INTRAOCULAR LENS PLACEMENT (IOC);  Surgeon: WiBirder RobsonMD;  Location: [facility];  Service: Ophthalmology;  Laterality: Right;  [place]1:26 ?AP% 18.5 ?CDE 16.12 ?Fluid pack lot # [id number]  ? COLONOSCOPY  07/2016  ? Dr. MaThana Farr? DILATION AND CURETTAGE OF UTERUS    ? miscarragesx2  ? ESOPHAGOGASTRODUODENOSCOPY    ? Polyps  ? HEMORRHOID SURGERY  2006  ? KNEE ARTHROSCOPY WITH LATERAL MENISECTOMY Left 12/30/2012  ? Procedure: KNEE ARTHROSCOPY WITH LATERAL MENISECTOMY;  Surgeon: JoJohnny BridgeMD;  [place] [facility] Service: [facility]ics;  Laterality: Left;  LEFT KNEE SCOPE LATERAL MENISCECTOMY  ? LEFT HEART CATH AND CORONARY ANGIOGRAPHY N/A 03/15/2017  ? Procedure: Left Heart Cath and Coronary Angiography;  Surgeon: MuWellington HampshireMD;  [facility];  Service: Cardiovascular;  Laterality: N/A;  ? MOUTH SURGERY    ? skin cancer eyelid  2012  ? TONSILLECTOMY    ? ? ?Home Medications:  ?Allergies as of 03/04/2022   ? ?   Reactions  ? Antihistamines, Chlorpheniramine-type Other (See Comments)  ? Reaction:  Makes pt hyper   ? Atenolol Hypertension  ? Cefuroxime Axetil Other (See Comments)  ? Upset stomach  ? Ciprofloxacin Other (See Comments)  ? Upset stomach  ? Co Q 10 [coenzyme Q10] Other (See Comments)  ? Reaction:  GI upset   ? Crestor [rosuvastatin Calcium] Other (See Comments)  ? Reaction:  Myalgias  ? Dextromethorphan-guaifenesin Other (See Comments)  ? Reaction:  Made pt jittery   ? Epinephrine Hives  ? Ramipril  Other (See Comments)  ? Upset stomach  ? Sulfamethoxazole-trimethoprim Other (See Comments)  ? Upset stomach  ? Vitamin D Analogs Other (See Comments)  ? Reaction:  GI upset   ? ?  ? ?  ?Medication List  ?  ? ?  ? Accurate as of March 04, 2022  8:52 AM. If you have any questions, ask your nurse or doctor.  ?  ?  ? ?  ? ?acetaminophen 500 MG tablet ?Commonly known as: TYLENOL ?Take 500 mg by mouth every 6 (six) hours as needed. ?  ?amitriptyline 10 MG tablet ?Commonly known as: ELAVIL ?TAKE ONE TABLET BY MOUTH AT BEDTIME ?  ?apixaban 5 MG Tabs tablet ?Commonly known as: Eliquis ?TAKE 1 TABLET(5 MG) BY MOUTH TWICE DAILY ?  ?atorvastatin 10 MG tablet ?Commonly known as: LIPITOR ?Take 1 tablet (10 mg total) by mouth daily. ?  ?CALCIUM 600+D PO ?Take 1 tablet by mouth daily. ?  ?clobetasol ointment 0.05 % ?Commonly known as: TEMOVATE ?Apply 1 application topically 2 (two) times daily as needed. ?  ?clorazepate 7.5 MG tablet ?Commonly known as: TRANXENE ?Take 1 tablet (7.5 mg total) by mouth daily as needed. ?  ?cyanocobalamin 1000 MCG/ML injection ?Commonly known as: (VITAMIN B-12) ?Inject 1,000 mcg into the muscle every 30 (thirty) days. ?  ?diltiazem 180 MG 24 hr capsule ?Commonly known as: CARDIZEM CD ?Take 1 capsule (180 mg total) by mouth daily. ?  ?guaiFENesin 600 MG 12 hr tablet ?Commonly known as: [place] ?Take 600 mg by mouth 2 (two) times daily as needed. ?  ?irbesartan 300 MG tablet ?Commonly known as: AVAPRO ?TAKE 1 TABLET BY MOUTH DAILY ?  ?loperamide 2 MG capsule ?Commonly known as: IMODIUM ?Take 2 mg by mouth as needed for diarrhea or loose stools. ?  ?MAGNESIUM LACTATE PO ?Take by mouth 2 (two) times daily. ?  ?methotrexate (PF) 50 MG/2ML injection ?INJECT 0.6 ML (15MG) SUBCUTANEOUSLY ONCEA WEEK ?  ?triamcinolone 55 MCG/ACT Aero nasal inhaler ?Commonly known as: NASACORT ?Place 2 sprays into the nose daily. ?  ?TUBERCULIN SYR 1CC/26GX3/8" 26G X 3/8" 1 ML Misc ?For injection of subcutaneous methotrexate  once weekly ?  ?ZANTAC PO ?Take by mouth. ?  ? ?  ? ? ?Allergies:  ?Allergies  ?Allergen Reactions  ? Antihistamines, Chlorpheniramine-Type Other (See Comments)  ?  Reaction:  Makes pt hyper   ? Atenolol Hypertension  ? Cefuroxime Axetil Other (See Comments)  ?  Upset stomach  ? Ciprofloxacin Other (See Comments)  ?  Upset stomach  ? Co Q 10 [Coenzyme Q10] Other (See Comments)  ?  Reaction:  GI upset   ? Crestor [Rosuvastatin Calcium] Other (See Comments)  ?  Reaction:  Myalgias ?  ? Dextromethorphan-Guaifenesin Other (See Comments)  ?  Reaction:  Made pt jittery   ? Epinephrine Hives  ? Ramipril Other (See Comments)  ?  Upset stomach  ? Sulfamethoxazole-Trimethoprim Other (See Comments)  ?  Upset stomach  ? Vitamin D Analogs Other (See Comments)  ?  Reaction:  GI upset   ? ? ?Family History: ?Family History  ?Problem Relation Age of Onset  ? Cancer Mother   ?     Colon  ? Heart disease Father   ?     CAD  ? Diabetes Sister   ? Hypothyroidism Sister   ? Diabetes Brother   ? Leukemia Brother   ? Esophageal cancer Brother   ? Cancer Paternal Aunt   ?     Breast  ? Diabetes Sister   ? Alzheimer's disease Sister   ? Diabetes Brother   ? Breast cancer Daughter   ? ? ?Social History:  reports that she quit smoking about 58 years ago. Her smoking use included cigarettes. She has a 12.00 pack-year smoking history. She has never used smokeless tobacco. She reports that she does not drink alcohol and does not use drugs. ? ? ?Physical Exam: ?BP 111/69   Pulse 81   Ht 5' 4"  (1.626 m)   Wt 182 lb (82.6 kg)   BMI 31.24 kg/m?   ?Constitutional:  Alert and oriented, No acute distress. ?HEENT: [place] AT, moist mucus membranes.  Trachea midline, no masses. ?Cardiovascular: No clubbing, cyanosis, or edema. ?Respiratory: Normal respiratory effort, no increased work of breathing. ?Psychiatric: Normal mood and affect. ? ?Laboratory Data: ? ?Urinalysis ?Dipstick: 1+ leukocytes/nitrite + ?Microscopy: 6-10 WBC/+ bacteria ? ? ?Assessment &  Plan:   ? ?1. Recurrent UTI ?UA today with mild pyuria and bacteriuria however she is asymptomatic ?Urine culture was ordered in the event she develops symptoms ?Continue D-mannose and vaginal estrogen ?She

## 2022-03-06 ENCOUNTER — Other Ambulatory Visit: Payer: Self-pay | Admitting: Internal Medicine

## 2022-03-06 DIAGNOSIS — M059 Rheumatoid arthritis with rheumatoid factor, unspecified: Secondary | ICD-10-CM

## 2022-03-07 LAB — CULTURE, URINE COMPREHENSIVE

## 2022-03-08 NOTE — Progress Notes (Signed)
? ?Office Visit Note ? ?Patient: Tammy Manning             ?Date of Birth: 12-Jul-1935           ?MRN: [id number]             ?PCP: Abner Greenspan, MD ?Referring: [name], [name], MD ?Visit Date: 03/09/2022 ? ? ?Subjective:  ?Follow-up (Doing great) ? ? ?History of Present Illness: Tammy Manning is a 87 y.o. female here for follow up for seropositive RA on MTX 15 mg [place] weekly. She is doing very well since last visit joint pains almost all better except bilateral hips bothering her intermittently. Left knee also creaky but not much inflammation problem. She has persistent stomach irritation, not much relief with taking pepcid as needed. [facility] not able to [facility] methot[facility]ate Rx from supply issue but she still had medication on hand. She saw urology recently with persistent abnormal UA and cultures but mostly asymptomatic. ? ?Previous HPI ?12/29/21 ?Tammy Manning is a 87 y.o. female here for follow up for RA after restarting methotrexate at 15 mg [place] weekly, which had been delayed due to repeated interruptions from UTI infections and antibiotic treatment. She has noticed improvement in joint symptoms after about 2 weeks taking the methotrexate. She continues to have some nausea when she takes this despite subcutaneous route. ?  ?Previous HPI ?11/25/20 ?Tammy Manning is a 87 y.o. female with a history of afib, TIA, and OA here for evaluation of joint pain and positive rheumatoid factor. She has joint pain in multiple sites including both hands but also has a lot of pain with the right shoulder that started more acutely with suspected rotator cuff tear. 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 She does notice swelling in hands intermittently. She has morning stiffness lasting less than 30 minutes daily also persistent pain throughout the day. She has had left radius fracture with surgical repair but no other major surgery of her  arms. ?  ? ? ?Review of Systems  ?Constitutional:  Negative for fatigue.  ?HENT:  Positive for mouth dryness.   ?Eyes:  Positive for dryness.  ?Respiratory:  Negative for shortness of breath.   ?Cardiovascular:  Negative for swelling in legs/feet.  ?Gastrointestinal:  Positive for constipation and diarrhea.  ?Endocrine: Positive for cold intolerance and increased urination.  ?Genitourinary:  Positive for difficulty urinating and painful urination.  ?Musculoskeletal:  Positive for joint pain, joint pain, joint swelling and muscle tenderness.  ?Skin:  Negative for rash.  ?Allergic/Immunologic: Positive for susceptible to infections.  ?Neurological:  Negative for numbness.  ?Hematological:  Positive for bruising/bleeding tendency.  ?Psychiatric/Behavioral:  Negative for sleep disturbance.   ? ?PMFS History:  ?Patient Active Problem List  ? Diagnosis Date Noted  ? Multinodular goiter 02/27/2022  ? Encounter for screening mammogram for breast cancer 01/23/2022  ? Recurrent UTI 11/25/2021  ? High risk medication use 10/07/2021  ? Atrial fibrillation with RVR ([place]) 04/28/2021  ? Hypertrophic toenail 04/22/2021  ? Pedal edema 03/24/2021  ? Seropositive rheumatoid arthritis ([place]) 11/25/2020  ? Bilateral hand pain 11/25/2020  ? Bilateral shoulder pain 10/16/2020  ? Joint pain 10/16/2020  ? Estrogen deficiency 04/30/2020  ? Dry mouth 03/15/2020  ? Osteoarthritis of right hip 01/02/2020  ? Groin pain, right 01/01/2020  ? Right thyroid nodule 05/12/2018  ? History of TIA (transient ischemic attack) 05/09/2018  ? Aortic insufficiency 09/10/2015  ?  Stress reaction 09/08/2015  ? Encounter for Medicare annual wellness exam 02/28/2013  ? Bucket-handle tear of lateral meniscus of left knee as current injury 12/30/2012  ? Episodic atrial fibrillation ([place]) 11/17/2012  ? Special screening for malignant neoplasms, colon 01/21/2012  ? Hearing loss of both ears 01/14/2012  ? HEADACHE, CHRONIC 09/24/2010  ? B12 deficiency 03/17/2010  ? GERD  (gastroesophageal reflux disease) 03/17/2010  ? POSTMENOPAUSAL STATUS 08/20/2008  ? Essential hypertension 07/19/2007  ? Hyperlipidemia 06/24/2007  ? IBS 06/24/2007  ? DERMATITIS, ATOPIC 06/24/2007  ? SKIN CANCER, HX OF 06/24/2007  ?  ?Past Medical History:  ?Diagnosis Date  ? A-fib ([address])   ? Anginal pain ([place])   ? Aortic insufficiency   ? a. 02/2020 Echo: EF 60-65%, no rwma, Gr1 DD, nl RV fxn, mildly dil LA, triv AI, Asc Ao 53m; b. 04/2021 Echo: EF 60-65%, no rwma, GrI DD, nl RV fxn, mildly dil LA, AI not visualized. Mild-mod AoV sclerosis w/o stenosis.  ? Arthritis   ? Knees  ? B12 deficiency   ? Mild  ? Bucket-handle tear of lateral meniscus of left knee as current injury 12/30/2012  ? Cancer ([facility]   ? Skin, basal cell on nose and eyelid  ? DJD (degenerative joint disease)   ? Low back  ? Dyspepsia   ? GERD (gastroesophageal reflux disease)   ? Heart murmur   ? Heart palpitations   ? History of cardiac cath   ? a. 02/2017 Cath: Nl cors. Nl LV fxn. Abd Aogram w/o RAS.  ? HOH (hard of hearing)   ? Hyperlipidemia   ? Hypertension   ? IBS (irritable bowel syndrome)   ? Lactose intolerance   ? Mixed incontinence   ? PONV (postoperative nausea and vomiting)   ? Vulvar irritation   ? from urine pad, uses Triamcinolone oint.  ?  ?Family History  ?Problem Relation Age of Onset  ? Cancer Mother   ?     Colon  ? Heart disease Father   ?     CAD  ? Diabetes Sister   ? Hypothyroidism Sister   ? Diabetes Brother   ? Leukemia Brother   ? Esophageal cancer Brother   ? Cancer Paternal Aunt   ?     Breast  ? Diabetes Sister   ? Alzheimer's disease Sister   ? Diabetes Brother   ? Breast cancer Daughter   ? ?Past Surgical History:  ?Procedure Laterality Date  ? ABDOMINAL AORTOGRAM N/A 03/15/2017  ? Procedure: Abdominal Aortogram;  Surgeon: MWellington Hampshire MD;  [facility];  Service: Cardiovascular;  Laterality: N/A;  ? ABDOMINAL HYSTERECTOMY  1982  ? Large fibroids and bleeding  ? APPENDECTOMY    ? BILATERAL  SALPINGOOPHORECTOMY  1986  ? BONE GRAFT HIP ILIAC CREST    ? To Left arm  ? BREAST REDUCTION SURGERY    ? BREAST SURGERY    ? CARDIAC CATHETERIZATION    ? CATARACT EXTRACTION W/PHACO Left 07/07/2016  ? Procedure: CATARACT EXTRACTION PHACO AND INTRAOCULAR LENS PLACEMENT (IOC);  Surgeon: WBirder Robson MD;  Location: [facility];  Service: Ophthalmology;  Laterality: Left;  [place]01:10 ?AP% 18.3 ?CDE 12.91 ?Fluid pak lot # [id number]  ? CATARACT EXTRACTION W/PHACO Right 07/28/2016  ? Procedure: CATARACT EXTRACTION PHACO AND INTRAOCULAR LENS PLACEMENT (IOC);  Surgeon: WBirder Robson MD;  Location: [facility];  Service: Ophthalmology;  Laterality: Right;  [place]01:26 ?AP% 18.5 ?CDE 16.12 ?Fluid pack lot # [id number]  ?  COLONOSCOPY  07/2016  ? Dr. Thana Farr  ? DILATION AND CURETTAGE OF UTERUS    ? miscarragesx2  ? ESOPHAGOGASTRODUODENOSCOPY    ? Polyps  ? HEMORRHOID SURGERY  2006  ? KNEE ARTHROSCOPY WITH LATERAL MENISECTOMY Left 12/30/2012  ? Procedure: KNEE ARTHROSCOPY WITH LATERAL MENISECTOMY;  Surgeon: Johnny Bridge, MD;  Location: [facility];  Service: Orthopedics;  Laterality: Left;  LEFT KNEE SCOPE LATERAL MENISCECTOMY  ? LEFT HEART CATH AND CORONARY ANGIOGRAPHY N/A 03/15/2017  ? Procedure: Left Heart Cath and Coronary Angiography;  Surgeon: Wellington Hampshire, MD;  Location: [place] [facility];  Service: Cardiovascular;  Laterality: N/A;  ? MOUTH SURGERY    ? skin cancer eyelid  2012  ? TONSILLECTOMY    ? ?Social History  ? 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    ? ?Immunization History  ?Administered Date(s) Administered  ? Fluad Quad(high Dose 65+) 10/16/2020  ? Influenza Split 07/24/2011, 08/23/2014  ? Influenza Whole 08/23/2004, 07/24/2009, 08/06/2010, 08/05/2012  ? Influenza, High Dose Seasonal PF 08/11/2016, 08/27/2017, 08/02/2018, 09/05/2021  ? Influenza,inj,Quad PF,6+ Mos 08/16/2013, 08/07/2015  ? Influenza-Unspecified 09/05/2021  ? PFIZER(Purple Top)SARS-COV-2 Vaccination 12/15/2019, 01/05/2020,  10/25/2020  ? Pneumococcal Conjugate-13 08/24/2014  ? Pneumococcal Polysaccharide-23 08/20/2008  ? Td 03/20/2003, 02/28/2013  ? Zoster, Live 09/04/2015  ?  ? ?Objective: ?Vital Signs: BP 127/67 (BP Location: Left

## 2022-03-09 ENCOUNTER — Ambulatory Visit (INDEPENDENT_AMBULATORY_CARE_PROVIDER_SITE_OTHER): Payer: Medicare Other | Admitting: Internal Medicine

## 2022-03-09 ENCOUNTER — Telehealth: Payer: Self-pay | Admitting: *Deleted

## 2022-03-09 ENCOUNTER — Encounter: Payer: Self-pay | Admitting: Internal Medicine

## 2022-03-09 VITALS — BP 127/67 | HR 76 | Resp 16 | Ht 64.5 in | Wt 184.0 lb

## 2022-03-09 DIAGNOSIS — M059 Rheumatoid arthritis with rheumatoid factor, unspecified: Secondary | ICD-10-CM

## 2022-03-09 DIAGNOSIS — Z79899 Other long term (current) drug therapy: Secondary | ICD-10-CM | POA: Diagnosis not present

## 2022-03-09 DIAGNOSIS — M79641 Pain in right hand: Secondary | ICD-10-CM | POA: Diagnosis not present

## 2022-03-09 DIAGNOSIS — M79642 Pain in left hand: Secondary | ICD-10-CM | POA: Diagnosis not present

## 2022-03-09 MED ORDER — METHOTREXATE SODIUM CHEMO INJECTION (PF) 50 MG/2ML: INTRAMUSCULAR | 4 refills | Status: DC

## 2022-03-09 MED ORDER — "TUBERCULIN SYRINGE 26G X 3/8"" 1 ML MISC": 0 refills | Status: DC

## 2022-03-09 NOTE — Telephone Encounter (Signed)
Notified patient as instructed, patient  states sje os not have any symptoms at this time.  ?

## 2022-03-09 NOTE — Telephone Encounter (Signed)
-----   Message from Abbie Sons, MD sent at 03/08/2022  9:22 AM EDT ----- ?She was asymptomatic at last week's office visit.  Urine culture + for bacteria however would not recommend treatment without UTI symptoms ?

## 2022-03-09 NOTE — Telephone Encounter (Signed)
Next Visit: 03/09/2022 ? ?Last Visit: 12/29/2021 ? ?Last Fill: 01/07/2022 ? ?DX: Seropositive rheumatoid arthritis  ? ?Current Dose per office note 12/29/2021: methotrexate 15 mg subcu weekly  ? ?Labs: 01/16/2022 RBC 3.65, Hgb 11.3, Hct 34.5, RDW 17.1 ? ?Okay to refill MTX?  ?

## 2022-03-10 LAB — COMPLETE METABOLIC PANEL WITH GFR
AG Ratio: 1.9 (calc) (ref 1.0–2.5)
ALT: 9 U/L (ref 6–29)
AST: 12 U/L (ref 10–35)
Albumin: 4.2 g/dL (ref 3.6–5.1)
Alkaline phosphatase (APISO): 45 U/L (ref 37–153)
BUN: 22 mg/dL (ref 7–25)
CO2: 27 mmol/L (ref 20–32)
Calcium: 9.7 mg/dL (ref 8.6–10.4)
Chloride: 104 mmol/L (ref 98–110)
Creat: 0.9 mg/dL (ref 0.60–0.95)
Globulin: 2.2 g/dL (calc) (ref 1.9–3.7)
Glucose, Bld: 89 mg/dL (ref 65–99)
Potassium: 4.6 mmol/L (ref 3.5–5.3)
Sodium: 139 mmol/L (ref 135–146)
Total Bilirubin: 0.3 mg/dL (ref 0.2–1.2)
Total Protein: 6.4 g/dL (ref 6.1–8.1)
eGFR: 63 mL/min/{1.73_m2} (ref >=60)

## 2022-03-10 LAB — CBC WITH DIFFERENTIAL/PLATELET
Absolute Monocytes: 774 cells/uL (ref 200–950)
Basophils Absolute: 39 cells/uL (ref 0–200)
Basophils Relative: 0.4 %
Eosinophils Absolute: 108 cells/uL (ref 15–500)
Eosinophils Relative: 1.1 %
HCT: 36.3 % (ref 35.0–45.0)
Hemoglobin: 12.1 g/dL (ref 11.7–15.5)
Lymphs Abs: 3401 cells/uL (ref 850–3900)
MCH: 33.2 pg — ABNORMAL HIGH (ref 27.0–33.0)
MCHC: 33.3 g/dL (ref 32.0–36.0)
MCV: 99.5 fL (ref 80.0–100.0)
MPV: 9.5 fL (ref 7.5–12.5)
Monocytes Relative: 7.9 %
Neutro Abs: 5478 cells/uL (ref 1500–7800)
Neutrophils Relative %: 55.9 %
Platelets: 299 10*3/uL (ref 140–400)
RBC: 3.65 10*6/uL — ABNORMAL LOW (ref 3.80–5.10)
RDW: 16 % — ABNORMAL HIGH (ref 11.0–15.0)
Total Lymphocyte: 34.7 %
WBC: 9.8 10*3/uL (ref 3.8–10.8)

## 2022-03-10 LAB — SEDIMENTATION RATE: Sed Rate: 14 mm/h (ref 0–30)

## 2022-03-13 ENCOUNTER — Other Ambulatory Visit: Payer: Self-pay | Admitting: Family Medicine

## 2022-03-13 ENCOUNTER — Other Ambulatory Visit: Payer: Self-pay | Admitting: Nurse Practitioner

## 2022-03-13 DIAGNOSIS — I10 Essential (primary) hypertension: Secondary | ICD-10-CM

## 2022-03-25 ENCOUNTER — Other Ambulatory Visit: Payer: Self-pay | Admitting: Internal Medicine

## 2022-03-25 DIAGNOSIS — M059 Rheumatoid arthritis with rheumatoid factor, unspecified: Secondary | ICD-10-CM

## 2022-03-26 ENCOUNTER — Ambulatory Visit (INDEPENDENT_AMBULATORY_CARE_PROVIDER_SITE_OTHER): Payer: Medicare Other

## 2022-03-26 ENCOUNTER — Other Ambulatory Visit: Payer: Self-pay | Admitting: *Deleted

## 2022-03-26 DIAGNOSIS — M059 Rheumatoid arthritis with rheumatoid factor, unspecified: Secondary | ICD-10-CM

## 2022-03-26 DIAGNOSIS — E538 Deficiency of other specified B group vitamins: Secondary | ICD-10-CM | POA: Diagnosis not present

## 2022-03-26 MED ORDER — METHOTREXATE SODIUM CHEMO INJECTION (PF) 50 MG/2ML: INTRAMUSCULAR | 4 refills | Status: DC

## 2022-03-26 MED ORDER — CYANOCOBALAMIN 1000 MCG/ML IJ SOLN
1000.0000 ug | Freq: Once | INTRAMUSCULAR | Status: AC
  Administered 2022-03-26: 1000 ug via INTRAMUSCULAR

## 2022-03-26 NOTE — Progress Notes (Signed)
Lab results look good for continuing current treatment, no problems with methotrexate and her sedimentation rate came down to normal.

## 2022-03-26 NOTE — Progress Notes (Signed)
Per orders of [facility], in Dr. Marliss Coots absence, a monthly injection of B12 given by Loreen Freud. ?Patient tolerated injection well.  ?

## 2022-03-30 DIAGNOSIS — M25551 Pain in right hip: Secondary | ICD-10-CM | POA: Diagnosis not present

## 2022-04-08 ENCOUNTER — Telehealth: Payer: Self-pay | Admitting: Family Medicine

## 2022-04-08 NOTE — Telephone Encounter (Signed)
[facility] Day - Client ?TELEPHONE ADVICE RECORD ?[facility]urse? ?Patient ?Name: ?Tammy Manning ?EL ?Gender: Female ?DOB: 12-06-1934 ?Age: 87 Y 38 D ?Return ?Phone ?Number: ?[phone] ?(Primary) ?Address: ?City/ ?State/ ?Zip: ?[place] ? [address] ?Client [facility] - Client ?Client Site [place] - Day ?Provider [name], Roque Lias - MD ?Contact Type Call ?Who Is Calling Patient / Member / Family / Caregiver ?Call Type Triage / Clinical ?Relationship To Patient Self ?Return Phone Number [phone] (Primary) ?Chief Complaint Vomiting ?Reason for Call Symptomatic / Request for Health Information ?Initial Comment Call sent from office. Caller has been up all night ?with diarrhea that is yellow and has vomited ?yellow this morning. It started Sunday on their ?way home from vacation so she took lots of ?Immodium. ?Translation No ?Nurse Assessment ?Nurse: Rolena Infante, RN, Patrice Date/Time Eilene Ghazi Time): 04/08/2022 9:12:09 AM ?Confirm and document reason for call. If ?symptomatic, describe symptoms. ?---Caller states diarrhea since Sunday returning from ?vacation. Diarrhea X5 Watery diarrhea small amounts. ?No fever. C/o lower back pain this am. Vomited x 1 ?this am. Taking Imodium for her diarrhea. ?Does the patient have any new or worsening ?symptoms? ---Yes ?Will a triage be completed? ---Yes ?Related visit to physician within the last 2 weeks? ---No ?Does the PT have any chronic conditions? (i.e. ?diabetes, asthma, this includes High risk factors for ?pregnancy, etc.) ?---Yes ?List chronic conditions. ---HTN, RA, ?Is this a behavioral health or substance abuse call? ---No ?Guidelines ?Guideline Title Affirmed Question Affirmed Notes Nurse Date/Time (Eastern ?Time) ?Diarrhea [1] SEVERE diarrhea ?(e.g., 7 or more ?times / day more than ?normal) AND [2] ?present > 24 hours (1 ?day) ?Rolena Infante, [place], [place] 04/08/2022 9:16:07 ?AM ?PLEASE NOTE: All timestamps  contained within this report are represented as [place] Standard Time. ?CONFIDENTIALTY NOTICE: This fax transmission is intended only for the addressee. It contains information that is legally privileged, confidential or ?otherwise protected from use or disclosure. If you are not the intended recipient, you are strictly prohibited from reviewing, disclosing, copying using ?or disseminating any of this information or taking any action in reliance on or regarding this information. If you have received this fax in error, please ?notify us immediately by telephone so that we can arrange for its return to [place]. Phone: [phone], Toll-Free: [phone], Fax: [phone] ?Page: 2 of 2 ?Call Id: [id number] ?Disp. Time (Eastern ?Time) Disposition Final User ?04/08/2022 9:20:54 AM See PCP within 24 Hours Yes Rolena Infante, Therapist, sports, Patrice ?Caller Disagree/Comply Comply ?Caller Understands Yes ?PreDisposition Call Doctor ?Care Advice Given Per Guideline ?SEE PCP WITHIN 24 HOURS: FLUID THERAPY DURING SEVERE DIARRHEA: FOOD AND NUTRITION DURING ?SEVERE DIARRHEA: * Drinking enough liquids is more important than eating when one has severe diarrhea. * Begin with boiled ?starches / cereals (e.g., potatoes, rice, noodles, wheat, oats) with a small amount of salt. * You can also eat bananas, yogurt, crackers, ?soup. DIARRHEA MEDICINE - LOPERAMIDE (IMODIUM AD): CONTAGIOUSNESS: * Wash your hands after using the ?bathroom. * Wash your hands before fixing or eating food. CALL BACK IF: * Signs of dehydration occur (e.g., no urine over 12 ?hours, very dry mouth, lightheaded, etc.) * Constant or severe abdomen pain * Bloody stools * You become worse ?Referrals ?REFERRED TO PCP OFFIC ?

## 2022-04-08 NOTE — Telephone Encounter (Signed)
Aware ?Agree with ER precautions  ?

## 2022-04-08 NOTE — Telephone Encounter (Signed)
Pt already has appt scheduled with Dr Glori Bickers on 04/09/22 and Larene Beach CMA that spoke with pt said that pt wanted to wait until 04/09/22 for appt and ED precautions given.sending note to Dr Glori Bickers and Rexene Agent CMA and will teams [place] CMA. ?

## 2022-04-08 NOTE — Telephone Encounter (Signed)
Pt called and stated she has been up all night with diarrhea, this morning it was yellow and watery like she also threw up it was also yellow and watery like. I transferred her to access nurse.  ? ?Callback Number: [phone] ?

## 2022-04-09 ENCOUNTER — Ambulatory Visit (INDEPENDENT_AMBULATORY_CARE_PROVIDER_SITE_OTHER): Payer: Medicare Other | Admitting: Family Medicine

## 2022-04-09 ENCOUNTER — Encounter: Payer: Self-pay | Admitting: Family Medicine

## 2022-04-09 DIAGNOSIS — R197 Diarrhea, unspecified: Secondary | ICD-10-CM | POA: Diagnosis not present

## 2022-04-09 LAB — HEPATIC FUNCTION PANEL
ALT: 11 U/L (ref 0–35)
AST: 11 U/L (ref 0–37)
Albumin: 3.8 g/dL (ref 3.5–5.2)
Alkaline Phosphatase: 39 U/L (ref 39–117)
Bilirubin, Direct: 0.1 mg/dL (ref 0.0–0.3)
Total Bilirubin: 0.6 mg/dL (ref 0.2–1.2)
Total Protein: 6.2 g/dL (ref 6.0–8.3)

## 2022-04-09 LAB — BASIC METABOLIC PANEL
BUN: 14 mg/dL (ref 6–23)
CO2: 28 mEq/L (ref 19–32)
Calcium: 9.7 mg/dL (ref 8.4–10.5)
Chloride: 102 mEq/L (ref 96–112)
Creatinine, Ser: 0.74 mg/dL (ref 0.40–1.20)
GFR: 73.97 mL/min (ref >60.00)
Glucose, Bld: 92 mg/dL (ref 70–99)
Potassium: 4.7 mEq/L (ref 3.5–5.1)
Sodium: 136 mEq/L (ref 135–145)

## 2022-04-09 LAB — CBC WITH DIFFERENTIAL/PLATELET
Basophils Absolute: 0 10*3/uL (ref 0.0–0.1)
Basophils Relative: 0.4 % (ref 0.0–3.0)
Eosinophils Absolute: 0.1 10*3/uL (ref 0.0–0.7)
Eosinophils Relative: 1.2 % (ref 0.0–5.0)
HCT: 34 % — ABNORMAL LOW (ref 36.0–46.0)
Hemoglobin: 11.3 g/dL — ABNORMAL LOW (ref 12.0–15.0)
Lymphocytes Relative: 16.3 % (ref 12.0–46.0)
Lymphs Abs: 1.8 10*3/uL (ref 0.7–4.0)
MCHC: 33.4 g/dL (ref 30.0–36.0)
MCV: 100.9 fl — ABNORMAL HIGH (ref 78.0–100.0)
Monocytes Absolute: 1.2 10*3/uL — ABNORMAL HIGH (ref 0.1–1.0)
Monocytes Relative: 11.1 % (ref 3.0–12.0)
Neutro Abs: 7.6 10*3/uL (ref 1.4–7.7)
Neutrophils Relative %: 71 % (ref 43.0–77.0)
Platelets: 326 10*3/uL (ref 150.0–400.0)
RBC: 3.37 Mil/uL — ABNORMAL LOW (ref 3.87–5.11)
RDW: 16.1 % — ABNORMAL HIGH (ref 11.5–15.5)
WBC: 10.7 10*3/uL — ABNORMAL HIGH (ref 4.0–10.5)

## 2022-04-09 NOTE — Patient Instructions (Addendum)
Don't take more than 2 doses of immodium daily  If you don't need it, don't take it   Drink fluids  Don't eat until you are ready  Stay bland   BRAT diet  (bananas, rice,apple sauce and toast)   Get rest  Watch for fever or abdominal pain or worse vomiting or diarrhea  Signs of dehydration- dizzy, dry mouth or rapid heart rate   We will check labs  Bring the stool specimen over when you can

## 2022-04-09 NOTE — Assessment & Plan Note (Signed)
With n/v (now resolved) but no focal abd pain or fever and reassuring exam Able to hydrate Suspect viral gastroenteritis  Recommended continued fluids BRAT diet adv as tol Limits on immodium discussed S/s of dehydration discussed  slt inc risk as pt takes biologic med for RA Will check GI panel incl c diff Also cbc/chem Update if not starting to improve in a week or if worsening

## 2022-04-09 NOTE — Telephone Encounter (Signed)
[facility] Night - Client Nonclinical Telephone Record  [facility] Client [facility] Night - Client Client Site [place] Provider Loura Pardon - MD Contact Type Call Who Is Calling Patient / Member / Family / Caregiver Caller Name [place] Phone Number [phone] Patient Name Tammy Manning Patient DOB 1935-05-26 Call Type Message Only Information Provided Reason for Call Request to [facility] Appointment Initial Comment Caller stated that they have an appt that they need to cancel. Patient request to speak to RN No Additional Comment Office hours provided. Disp. Time Disposition Final User 04/08/2022 5:15:25 PM General Information Provided Yes McGehee, April Call Closed By: April McGehee Transaction Date/Time: 04/08/2022 5:12:50 PM (ET

## 2022-04-09 NOTE — Progress Notes (Signed)
Subjective:    Patient ID: Tammy Manning, female    DOB: 12/12/1934, 87 y.o.   MRN: [id number]  HPI Pt presents for c/o diarrhea and vomiting   Wt Readings from Last 3 Encounters:  04/09/22 181 lb 6 oz (82.3 kg)  03/09/22 184 lb (83.5 kg)  03/04/22 182 lb (82.6 kg)   30.65 kg/m  Symptoms started Sunday am in the am  Was on vacation last week/ in [facility] The night before she ate at an [employer] - got a lot of heartburn even though she did not eat much (took a zegrid)   Started with diarrhea  Liquid stools/frequent  (had to come home)  Took immodium/ took 7 that day to get home  At first there was a lot of cramping  Some discomfort in LLQ   Then nausea on day 3  Also some low back pain  Stools slowed down but still watery   Felt a little better yesterday  This am- watery diarrhea again   No abd pain today  A little back ache  Does not feel bad   Last vomiting 2 d ago  (vomit was yellow and stool was yellow)   No blood in stool or emesis No fever   Drinking pedialyte and water now -able to get some in   Eating soup with rice   H/o diverticulitis in the past Last colonoscopy 2019   Takes mtx for arthritis  BP Readings from Last 3 Encounters:  04/09/22 140/68  03/09/22 127/67  03/04/22 111/69   Pulse Readings from Last 3 Encounters:  04/09/22 72  03/09/22 76  03/04/22 81   Patient Active Problem List   Diagnosis Date Noted   Diarrhea 04/09/2022   Multinodular goiter 02/27/2022   Encounter for screening mammogram for breast cancer 01/23/2022   Recurrent UTI 11/25/2021   High risk medication use 10/07/2021   Atrial fibrillation with RVR ([place]) 04/28/2021   Hypertrophic toenail 04/22/2021   Pedal edema 03/24/2021   Seropositive rheumatoid arthritis ([place]) 11/25/2020   Bilateral hand pain 11/25/2020   Bilateral shoulder pain 10/16/2020   Joint pain 10/16/2020   Estrogen deficiency 04/30/2020   Dry mouth 03/15/2020   Osteoarthritis of right hip  01/02/2020   Groin pain, right 01/01/2020   Right thyroid nodule 05/12/2018   History of TIA (transient ischemic attack) 05/09/2018   Aortic insufficiency 09/10/2015   Stress reaction 09/08/2015   Encounter for Medicare annual wellness exam 02/28/2013   Bucket-handle tear of lateral meniscus of left knee as current injury 12/30/2012   Episodic atrial fibrillation ([place]) 11/17/2012   Special screening for malignant neoplasms, colon 01/21/2012   Hearing loss of both ears 01/14/2012   HEADACHE, CHRONIC 09/24/2010   B12 deficiency 03/17/2010   GERD (gastroesophageal reflux disease) 03/17/2010   POSTMENOPAUSAL STATUS 08/20/2008   Essential hypertension 07/19/2007   Hyperlipidemia 06/24/2007   IBS 06/24/2007   DERMATITIS, ATOPIC 06/24/2007   SKIN CANCER, HX OF 06/24/2007   Past Medical History:  Diagnosis Date   A-fib ([place])    Anginal pain ([place])    Aortic insufficiency    a. 02/2020 Echo: EF 60-65%, no rwma, Gr1 DD, nl RV fxn, mildly dil LA, triv AI, Asc Ao 57m; b. 04/2021 Echo: EF 60-65%, no rwma, GrI DD, nl RV fxn, mildly dil LA, AI not visualized. Mild-mod AoV sclerosis w/o stenosis.   Arthritis    Knees   B12 deficiency    Mild   Bucket-handle tear of lateral meniscus of  left knee as current injury 12/30/2012   Cancer ([place])    Skin, basal cell on nose and eyelid   DJD (degenerative joint disease)    Low back   Dyspepsia    GERD (gastroesophageal reflux disease)    Heart murmur    Heart palpitations    History of cardiac cath    a. 02/2017 Cath: Nl cors. Nl LV fxn. Abd Aogram w/o RAS.   HOH (hard of hearing)    Hyperlipidemia    Hypertension    IBS (irritable bowel syndrome)    Lactose intolerance    Mixed incontinence    PONV (postoperative nausea and vomiting)    Vulvar irritation    from urine pad, uses Triamcinolone oint.   Past Surgical History:  Procedure Laterality Date   ABDOMINAL AORTOGRAM N/A 03/15/2017   Procedure: Abdominal Aortogram;  Surgeon: Wellington Hampshire, MD;  Location: [facility];  Service: Cardiovascular;  Laterality: N/A;   ABDOMINAL HYSTERECTOMY  1982   Large fibroids and bleeding   APPENDECTOMY     BILATERAL SALPINGOOPHORECTOMY  1986   BONE GRAFT HIP ILIAC CREST     To Left arm   BREAST REDUCTION SURGERY     BREAST SURGERY     CARDIAC CATHETERIZATION     CATARACT EXTRACTION W/PHACO Left 07/07/2016   Procedure: CATARACT EXTRACTION PHACO AND INTRAOCULAR LENS PLACEMENT (IOC);  Surgeon: Birder Robson, MD;  Location: [facility];  Service: Ophthalmology;  Laterality: Left;  [place] 01:10 AP% 18.3 CDE 12.91 Fluid pak lot # [id number]   CATARACT EXTRACTION W/PHACO Right 07/28/2016   Procedure: CATARACT EXTRACTION PHACO AND INTRAOCULAR LENS PLACEMENT (IOC);  Surgeon: Birder Robson, MD;  Location: [facility];  Service: Ophthalmology;  Laterality: Right;  [place] 01:26 AP% 18.5 CDE 16.12 Fluid pack lot # [id number]   COLONOSCOPY  07/2016   Dr. Thana Farr   DILATION AND CURETTAGE OF UTERUS     miscarragesx2   ESOPHAGOGASTRODUODENOSCOPY     Polyps   HEMORRHOID SURGERY  2006   KNEE ARTHROSCOPY WITH LATERAL MENISECTOMY Left 12/30/2012   Procedure: KNEE ARTHROSCOPY WITH LATERAL MENISECTOMY;  Surgeon: Johnny Bridge, MD;  Location: [place] [facility];  Service: Orthopedics;  Laterality: Left;  LEFT KNEE SCOPE LATERAL MENISCECTOMY   LEFT HEART CATH AND CORONARY ANGIOGRAPHY N/A 03/15/2017   Procedure: Left Heart Cath and Coronary Angiography;  Surgeon: Wellington Hampshire, MD;  Location: [facility];  Service: Cardiovascular;  Laterality: N/A;   MOUTH SURGERY     skin cancer eyelid  2012   TONSILLECTOMY     Social History   Tobacco Use   Smoking status: Former    Packs/day: 1.00    Years: 12.00    Pack years: 12.00    Types: Cigarettes    Quit date: 11/24/1963    Years since quitting: 58.4   Smokeless tobacco: Never  Vaping Use   Vaping Use: Never used  Substance Use Topics   Alcohol use: No    Alcohol/week: 0.0 standard  drinks   Drug use: No   Family History  Problem Relation Age of Onset   Cancer Mother        Colon   Heart disease Father        CAD   Diabetes Sister    Hypothyroidism Sister    Diabetes Brother    Leukemia Brother    Esophageal cancer Brother    Cancer Paternal Aunt        Breast  Diabetes Sister    Alzheimer's disease Sister    Diabetes Brother    Breast cancer Daughter    Allergies  Allergen Reactions   Antihistamines, Chlorpheniramine-Type Other (See Comments)    Reaction:  Makes pt hyper    Atenolol Hypertension   Cefuroxime Axetil Other (See Comments)    Upset stomach   Ciprofloxacin Other (See Comments)    Upset stomach   Co Q 10 [Coenzyme Q10] Other (See Comments)    Reaction:  GI upset    Crestor [Rosuvastatin Calcium] Other (See Comments)    Reaction:  Myalgias    Dextromethorphan-Guaifenesin Other (See Comments)    Reaction:  Made pt jittery    Epinephrine Hives   Ramipril Other (See Comments)    Upset stomach   Sulfamethoxazole-Trimethoprim Other (See Comments)    Upset stomach   Vitamin D Analogs Other (See Comments)    Reaction:  GI upset    Current Outpatient Medications on File Prior to Visit  Medication Sig Dispense Refill   acetaminophen (TYLENOL) 500 MG tablet Take 500 mg by mouth every 6 (six) hours as needed.     amitriptyline (ELAVIL) 10 MG tablet TAKE ONE TABLET BY MOUTH AT BEDTIME 30 tablet 2   apixaban (ELIQUIS) 5 MG TABS tablet TAKE 1 TABLET(5 MG) BY MOUTH TWICE DAILY 180 tablet 1   atorvastatin (LIPITOR) 10 MG tablet Take 1 tablet (10 mg total) by mouth daily. 90 tablet 3   Calcium Carbonate-Vitamin D (CALCIUM 600+D PO) Take 1 tablet by mouth daily.      clobetasol ointment (TEMOVATE) 6.01 % Apply 1 application topically 2 (two) times daily as needed.     clorazepate (TRANXENE) 7.5 MG tablet Take 1 tablet (7.5 mg total) by mouth daily as needed. 30 tablet 3   cyanocobalamin (,VITAMIN B-12,) 1000 MCG/ML injection Inject 1,000 mcg into  the muscle every 30 (thirty) days.      guaiFENesin (MUCINEX) 600 MG 12 hr tablet Take 600 mg by mouth 2 (two) times daily as needed.     irbesartan (AVAPRO) 300 MG tablet TAKE 1 TABLET BY MOUTH ONCE DAILY 90 tablet 2   loperamide (IMODIUM) 2 MG capsule Take 2 mg by mouth as needed for diarrhea or loose stools.     MAGNESIUM LACTATE PO Take by mouth 2 (two) times daily.     Methotrexate Sodium (METHOTREXATE, PF,) 50 MG/2ML injection INJECT 0.6 ML (15MG) SUBCUTANEOUSLY ONCEA WEEK 4 mL 4   raNITIdine HCl (ZANTAC PO) Take by mouth.     triamcinolone (NASACORT) 55 MCG/ACT AERO nasal inhaler Place 2 sprays into the nose daily. 1 each 12   TUBERCULIN SYR 1CC/26GX3/8" 26G X 3/8" 1 ML MISC For injection of subcutaneous methotrexate once weekly 25 each 0   diltiazem (CARDIZEM CD) 180 MG 24 hr capsule Take 1 capsule (180 mg total) by mouth daily. 90 capsule 3   No current facility-administered medications on file prior to visit.    Review of Systems  Constitutional:  Positive for fatigue. Negative for activity change, appetite change, fever and unexpected weight change.  HENT:  Negative for congestion, ear pain, rhinorrhea, sinus pressure and sore throat.   Eyes:  Negative for pain, redness and visual disturbance.  Respiratory:  Negative for cough, shortness of breath and wheezing.   Cardiovascular:  Negative for chest pain and palpitations.  Gastrointestinal:  Positive for diarrhea and nausea. Negative for abdominal distention, abdominal pain, anal bleeding, blood in stool, constipation and rectal pain.  Endocrine: Negative  for polydipsia and polyuria.  Genitourinary:  Negative for dysuria, frequency and urgency.  Musculoskeletal:  Negative for arthralgias, back pain and myalgias.  Skin:  Negative for pallor and rash.  Allergic/Immunologic: Negative for environmental allergies.  Neurological:  Negative for dizziness, syncope and headaches.  Hematological:  Negative for adenopathy. Does not  bruise/bleed easily.  Psychiatric/Behavioral:  Negative for decreased concentration and dysphoric mood. The patient is not nervous/anxious.       Objective:   Physical Exam Constitutional:      General: She is not in acute distress.    Appearance: Normal appearance. She is well-developed. She is obese. She is not ill-appearing or diaphoretic.  HENT:     Head: Normocephalic and atraumatic.     Mouth/Throat:     Mouth: Mucous membranes are moist.  Eyes:     General: No scleral icterus.    Conjunctiva/sclera: Conjunctivae normal.     Pupils: Pupils are equal, round, and reactive to light.  Neck:     Thyroid: No thyromegaly.     Vascular: No carotid bruit or JVD.  Cardiovascular:     Rate and Rhythm: Normal rate.     Heart sounds: Normal heart sounds.    No gallop.  Pulmonary:     Effort: Pulmonary effort is normal. No respiratory distress.     Breath sounds: Normal breath sounds. No stridor. No wheezing or rales.  Abdominal:     General: Abdomen is protuberant. Bowel sounds are normal. There is no distension or abdominal bruit.     Palpations: Abdomen is soft. There is no hepatomegaly, splenomegaly, mass or pulsatile mass.     Tenderness: There is no abdominal tenderness. There is no right CVA tenderness, left CVA tenderness, guarding or rebound. Negative signs include Murphy's sign and McBurney's sign.  Musculoskeletal:     Cervical back: Normal range of motion and neck supple. No tenderness.     Right lower leg: No edema.     Left lower leg: No edema.  Lymphadenopathy:     Cervical: No cervical adenopathy.  Skin:    General: Skin is warm and dry.     Capillary Refill: Capillary refill takes less than 2 seconds.     Coloration: Skin is not pale.     Findings: No rash.  Neurological:     Mental Status: She is alert.     Coordination: Coordination normal.     Deep Tendon Reflexes: Reflexes are normal and symmetric. Reflexes normal.  Psychiatric:        Mood and Affect: Mood  normal.          Assessment & Plan:   Problem List Items Addressed This Visit       Other   Diarrhea    With n/v (now resolved) but no focal abd pain or fever and reassuring exam Able to hydrate Suspect viral gastroenteritis  Recommended continued fluids BRAT diet adv as tol Limits on immodium discussed S/s of dehydration discussed  slt inc risk as pt takes biologic med for RA Will check GI panel incl c diff Also cbc/chem Update if not starting to improve in a week or if worsening          Relevant Orders   Basic metabolic panel   Hepatic function panel   CBC with Differential/Platelet   C. difficile GDH and Toxin A/B   Gastrointestinal Pathogen Pnl RT, PCR

## 2022-04-09 NOTE — Telephone Encounter (Signed)
I spoke with pt; pt said she felt better yesterday but this morning had diarrhea before could get to commode; pt has not had vomiting today and no abd pain.. pt did say lower back is hurting and pt wonders if all could be related to methotrexate; pt said stools are yellow. Pt wants to keep appt she has with Dr Glori Bickers today at 10 AM. UC & ED precautions given and pt voiced understanding. Sending note to Dr Glori Bickers and [place] CMA.

## 2022-04-13 ENCOUNTER — Other Ambulatory Visit: Payer: Medicare Other

## 2022-04-13 DIAGNOSIS — R197 Diarrhea, unspecified: Secondary | ICD-10-CM | POA: Diagnosis not present

## 2022-04-14 LAB — C. DIFFICILE GDH AND TOXIN A/B
GDH ANTIGEN: NOT DETECTED
MICRO NUMBER:: 13427550
SPECIMEN QUALITY:: ADEQUATE
TOXIN A AND B: NOT DETECTED

## 2022-04-14 LAB — GASTROINTESTINAL PATHOGEN PNL
CampyloBacter Group: NOT DETECTED
Norovirus GI/GII: NOT DETECTED
Rotavirus A: NOT DETECTED
Salmonella species: NOT DETECTED
Shiga Toxin 1: NOT DETECTED
Shiga Toxin 2: NOT DETECTED
Shigella Species: NOT DETECTED
Vibrio Group: NOT DETECTED
Yersinia enterocolitica: NOT DETECTED

## 2022-04-15 ENCOUNTER — Encounter: Payer: Self-pay | Admitting: Family Medicine

## 2022-04-23 ENCOUNTER — Telehealth: Payer: Self-pay

## 2022-04-23 NOTE — Progress Notes (Signed)
    Chronic Care Management Pharmacy Assistant   Name: Tammy Manning  MRN: [id number] DOB: [birth year]  Reason for Encounter: CCM (Appointment Reminder)  Medications: Outpatient Encounter Medications as of 04/23/2022  Medication Sig   acetaminophen (TYLENOL) 500 MG tablet Take 500 mg by mouth every 6 (six) hours as needed.   amitriptyline (ELAVIL) 10 MG tablet TAKE ONE TABLET BY MOUTH AT BEDTIME   apixaban (ELIQUIS) 5 MG TABS tablet TAKE 1 TABLET(5 MG) BY MOUTH TWICE DAILY   atorvastatin (LIPITOR) 10 MG tablet Take 1 tablet (10 mg total) by mouth daily.   Calcium Carbonate-Vitamin D (CALCIUM 600+D PO) Take 1 tablet by mouth daily.    clobetasol ointment (TEMOVATE) 9.03 % Apply 1 application topically 2 (two) times daily as needed.   clorazepate (TRANXENE) 7.5 MG tablet Take 1 tablet (7.5 mg total) by mouth daily as needed.   cyanocobalamin (,VITAMIN B-12,) 1000 MCG/ML injection Inject 1,000 mcg into the muscle every 30 (thirty) days.    diltiazem (CARDIZEM CD) 180 MG 24 hr capsule Take 1 capsule (180 mg total) by mouth daily.   guaiFENesin (MUCINEX) 600 MG 12 hr tablet Take 600 mg by mouth 2 (two) times daily as needed.   irbesartan (AVAPRO) 300 MG tablet TAKE 1 TABLET BY MOUTH ONCE DAILY   loperamide (IMODIUM) 2 MG capsule Take 2 mg by mouth as needed for diarrhea or loose stools.   MAGNESIUM LACTATE PO Take by mouth 2 (two) times daily.   Methotrexate Sodium (METHOTREXATE, PF,) 50 MG/2ML injection INJECT 0.6 ML (15MG) SUBCUTANEOUSLY ONCEA WEEK   raNITIdine HCl (ZANTAC PO) Take by mouth.   triamcinolone (NASACORT) 55 MCG/ACT AERO nasal inhaler Place 2 sprays into the nose daily.   TUBERCULIN SYR 1CC/26GX3/8" 26G X 3/8" 1 ML MISC For injection of subcutaneous methotrexate once weekly   No facility-administered encounter medications on file as of 04/23/2022.   Tammy Manning was contacted to remind of upcoming telephone visit with Charlene Brooke on 04/27/2022 at 3:00. Patient was  reminded to have any blood glucose and blood pressure readings available for review at appointment.   Message was left reminding patient of appointment.  CCM referral has been placed prior to visit?  No   Star Rating Drugs: Medication:  Last Fill: Day Supply Irbesartan 300 mg 03/13/2022 90 Atorvastatin 10 mg 01/14/2022 90 Fill dates verified with [facility]  Charlene Brooke, CPP notified  Marijean Niemann, [place] Clinical Pharmacy Assistant [phone]

## 2022-04-27 ENCOUNTER — Telehealth: Payer: Self-pay | Admitting: *Deleted

## 2022-04-27 ENCOUNTER — Ambulatory Visit: Payer: Medicare Other | Admitting: Pharmacist

## 2022-04-27 DIAGNOSIS — E7800 Pure hypercholesterolemia, unspecified: Secondary | ICD-10-CM

## 2022-04-27 DIAGNOSIS — I480 Paroxysmal atrial fibrillation: Secondary | ICD-10-CM

## 2022-04-27 DIAGNOSIS — I10 Essential (primary) hypertension: Secondary | ICD-10-CM

## 2022-04-27 DIAGNOSIS — I700 Atherosclerosis of aorta: Secondary | ICD-10-CM

## 2022-04-27 DIAGNOSIS — M85859 Other specified disorders of bone density and structure, unspecified thigh: Secondary | ICD-10-CM

## 2022-04-27 DIAGNOSIS — K219 Gastro-esophageal reflux disease without esophagitis: Secondary | ICD-10-CM

## 2022-04-27 DIAGNOSIS — M059 Rheumatoid arthritis with rheumatoid factor, unspecified: Secondary | ICD-10-CM

## 2022-04-27 DIAGNOSIS — Z87440 Personal history of urinary (tract) infections: Secondary | ICD-10-CM

## 2022-04-27 NOTE — Telephone Encounter (Signed)
Patient is asking for vaginal estrogen be sent in to [facility]

## 2022-04-27 NOTE — Progress Notes (Signed)
Chronic Care Management Pharmacy Note  04/27/2022 Name:  Tammy Manning MRN:  [id number] DOB:  Jun 14, 1935  Summary: CCM F/U visit -Reviewed medications; pt affirms adherence as prescribed -Pt is not taking folic acid with MTX and reports her rheumatologist is aware and monitoring more often due to this; discussed higher risk for adverse events without folic acid supplementation -LDL 108 technically above goal given hx atherosclerosis on scans, but she has no history of ASCVD events and given age/chronic pain, current control is reasonable  Recommendations/Changes made from today's visit: -No med changes  Plan: -[place] will call patient in 3 months for general update -Pharmacist follow up televisit scheduled for 6 months    Subjective: Tammy Manning is an 87 y.o. year old female who is a primary patient of [name], [name], MD.  The CCM team was consulted for assistance with disease management and care coordination needs.    Engaged with patient by telephone for follow up visit in response to provider referral for pharmacy case management and/or care coordination services.   Consent to Services:  The patient was given information about Chronic Care Management services, agreed to services, and gave verbal consent prior to initiation of services.  Please see initial visit note for detailed documentation.   Patient Care Team: [name], [name], MD as PCP - General Wellington Hampshire, MD as PCP - Cardiology (Cardiology) Susa Day, MD (Orthopedic Surgery) Richmond Campbell, MD (Gastroenterology) Wellington Hampshire, MD as Consulting Physician (Cardiology) Birder Robson, MD as Referring Physician (Ophthalmology) Charlton Haws, [facility] as Pharmacist (Pharmacist)  Patient lives at home with her husband. She is a retired Art therapist, her husband is a retired Engineer, structural. She enjoys oil painting.  Recent office visits: 04/09/22 Dr Glori Bickers OV: diarrhea,  infectious.  01/23/22 Dr Glori Bickers OV: annual exam - pt stopped folic acid d/t GI upset. 01/09/22 Dr Einar Pheasant OV: herpes zoster - rx'd valacyclovir  10/02/21 Dr Glori Bickers OV: acute visit for shoulder pain; advised to discuss with rheum; consider amitriptyline 09/25/21 pt message: c/o UTI. UA positive. Rx'd Keflex 07/22/21 pt message: c/o UTI. Rx Macrobid. 07/10/21 pt message: c/o UTI. UA positive. Rx'd Keflex. 07/04/21 Dr Glori Bickers OV: f/u vaginal bleeding, UTI. Given B12 shot. Bleeding resolved. No med changes.  Recent consult visits: 03/09/22 Dr Benjamine Mola (Rheum): f/u RA. Continue MTX and folic acid. 02/23/22 Dr Loanne Drilling (endocrine): R thyroid nodule. Recheck [place]. 12/29/21 Dr Benjamine Mola (Rheumatology): f/u RA. Add folic acid 1 mg daily.  12/09/21 Dr Fletcher Anon (cardiology): f/u PAF. No med changes.  12/01/21 Dr Benjamine Mola (Rheumatology): f/u RA. Switch to injectable methotrexate d/t GI side effects with oral.  12/01/21 Dr Bernardo Heater (Urology): recurernt UTI - discussed asx bacteriuria vs true UTI. Restart Estrace cream. Continue cranberry and mannose. Rec 3-mo course of trimethoprim 100 mg daily.  11/07/21 NP Ignacia Bayley (Cardiology): L leg swelling. Reassuring exam. Advised compression. No med changes  10/22/21 Dr Benjamine Mola (rheumatology): Dx rheumatoid arthritis; Rx'd methorexate 15 mg weekly, folic acid 1 mg daily; reduce prednisone to 5 mg daily (temporary until MTX started). 10/07/21 Dr Benjamine Mola (rheumatology): f/u chronic joint pain. Rx prednisone taper x 20 days. Recheck sed rate, CRP. Plan for oral methotrexate 08/05/21 NP Ignacia Bayley (cardiology): f/u Afib (Dx 04/2021). No med changes. F/u 4 months. 07/18/21 Dr Amalia Hailey (OB/GYN): f/u vaginal bleeding -likely anticoagulation and atrophic vaginitis. Rx'd Estrace cream 2-3 times weekly.  Hospital visits: 06/08/2021 [facility] Emergency Department - Patient presented for Atrial Fibrillation with RVR. CHANGE: diltiazem (CARDIZEM  CD) 240 MG 24 hr capsule VS. 120 mg oral daily. Patient discharged  same day.    Medication Reconciliation was completed by comparing discharge summary, patient's EMR and Pharmacy list, and upon discussion with patient.   Admitted to the hospital on 04/29/2021 due to Afib w/ RVR. Discharge date was 04/30/2021. Discharged from [place]?Medications Started at [facility] Discharge:?? -started apixaban (ELIQUIS) 5 MG TABS tablet -started diltiazem (CARDIZEM CD) 120 MG 24 hr capsule   Medication Changes at Hospital Discharge: No changes noted   Medications Discontinued at Hospital Discharge: -Stopped Amlodipine 2.5 mg tablet -Stopped Aspirin EC 81 mg tablet -Stopped Chlorthalidone 25 mg tablet   Medications that remain the same after Hospital Discharge:??  -All other medications will remain the same.     Objective:  Lab Results  Component Value Date   CREATININE 0.74 04/09/2022   BUN 14 04/09/2022   GFR 73.97 04/09/2022   GFRNONAA >60 06/08/2021   GFRAA 71 02/02/2020   NA 136 04/09/2022   K 4.7 04/09/2022   CALCIUM 9.7 04/09/2022   CO2 28 04/09/2022   GLUCOSE 92 04/09/2022    Lab Results  Component Value Date/Time   HGBA1C 5.7 (H) 05/10/2018 05:11 AM   GFR 73.97 04/09/2022 10:30 AM   GFR 68.50 01/16/2022 08:35 AM    Last diabetic Eye exam: No results found for: HMDIABEYEEXA  Last diabetic Foot exam: No results found for: HMDIABFOOTEX   Lab Results  Component Value Date   CHOL 199 01/16/2022   HDL 59.40 01/16/2022   LDLCALC 108 (H) 01/16/2022   LDLDIRECT 106 (H) 02/02/2020   TRIG 157.0 (H) 01/16/2022   CHOLHDL 3 01/16/2022       Latest Ref Rng & Units 04/09/2022   10:30 AM 03/09/2022    3:10 PM 01/16/2022    8:35 AM  Hepatic Function  Total Protein 6.0 - 8.3 g/dL 6.2   6.4   6.0    Albumin 3.5 - 5.2 g/dL 3.8    3.8    AST 0 - 37 U/L 11   12   13     ALT 0 - 35 U/L 11   9   17     Alk Phosphatase 39 - 117 U/L 39    53    Total Bilirubin 0.2 - 1.2 mg/dL 0.6   0.3   0.5    Bilirubin, Direct 0.0 - 0.3  mg/dL 0.1        Lab Results  Component Value Date/Time   TSH 1.16 01/16/2022 08:35 AM   TSH 1.33 04/25/2020 09:01 AM       Latest Ref Rng & Units 04/09/2022   10:30 AM 03/09/2022    3:10 PM 01/16/2022    8:35 AM  CBC  WBC 4.0 - 10.5 K/uL 10.7   9.8   6.1    Hemoglobin 12.0 - 15.0 g/dL 11.3   12.1   11.3    Hematocrit 36.0 - 46.0 % 34.0   36.3   34.5    Platelets 150.0 - 400.0 K/uL 326.0   299   335.0      Lab Results  Component Value Date/Time   VD25OH 33 08/22/2009 10:02 PM    Clinical ASCVD: No  The ASCVD Risk score (Arnett DK, et al., 2019) failed to calculate for the following reasons:   The 2019 ASCVD risk score is only valid for ages 51 to 69       12/24/2021  1:26 PM 06/12/2021   12:02 PM 04/26/2020    9:04 AM  Depression screen PHQ 2/9  Decreased Interest 0 0 0  Down, Depressed, Hopeless 0 0 0  PHQ - 2 Score 0 0 0  Altered sleeping  0 0  Tired, decreased energy  3 0  Change in appetite  0 0  Feeling bad or failure about yourself   0 0  Trouble concentrating  1 0  Moving slowly or fidgety/restless  0 0  Suicidal thoughts  0 0  PHQ-9 Score  4 0  Difficult doing work/chores  Not difficult at all Not difficult at all     CHA2DS2-VASc Score = 6  The patient's score is based upon: CHF History: 0 HTN History: 1 Diabetes History: 0 Stroke History: 2 Vascular Disease History: 0 Age Score: 2 Gender Score: 1       Social History   Tobacco Use  Smoking Status Former   Packs/day: 1.00   Years: 12.00   Pack years: 12.00   Types: Cigarettes   Quit date: 11/24/1963   Years since quitting: 58.4  Smokeless Tobacco Never   BP Readings from Last 3 Encounters:  04/09/22 140/68  03/09/22 127/67  03/04/22 111/69   Pulse Readings from Last 3 Encounters:  04/09/22 72  03/09/22 76  03/04/22 81   Wt Readings from Last 3 Encounters:  04/09/22 181 lb 6 oz (82.3 kg)  03/09/22 184 lb (83.5 kg)  03/04/22 182 lb (82.6 kg)   BMI Readings from Last 3 Encounters:   04/09/22 30.65 kg/m  03/09/22 31.10 kg/m  03/04/22 31.24 kg/m    Assessment/Interventions: Review of patient past medical history, allergies, medications, health status, including review of consultants reports, laboratory and other test data, was performed as part of comprehensive evaluation and provision of chronic care management services.   SDOH:  (Social Determinants of Health) assessments and interventions performed: No - done Feb 2023  SDOH Screenings   Alcohol Screen: Low Risk    Last Alcohol Screening Score (AUDIT): 0  Depression (PHQ2-9): Low Risk    PHQ-2 Score: 0  Financial Resource Strain: Low Risk    Difficulty of Paying Living Expenses: Not hard at all  Food Insecurity: No Food Insecurity   Worried About Charity fundraiser in the Last Year: Never true   Ran Out of Food in the Last Year: Never true  Housing: Low Risk    Last Housing Risk Score: 0  Physical Activity: Insufficiently Active   Days of Exercise per Week: 7 days   Minutes of Exercise per Session: 10 min  Social Connections: Moderately Isolated   Frequency of Communication with Friends and Family: More than three times a week   Frequency of Social Gatherings with Friends and Family: More than three times a week   Attends Religious Services: Never   Marine scientist or Organizations: No   Attends Archivist Meetings: Never   Marital Status: Married  Stress: No Stress Concern Present   Feeling of Stress : Only a little  Tobacco Use: Medium Risk   Smoking Tobacco Use: Former   Smokeless Tobacco Use: Never   Passive Exposure: Not on file  Transportation Needs: No Transportation Needs   Lack of Transportation (Medical): No   Lack of Transportation (Non-Medical): No    CCM Care Plan  Allergies  Allergen Reactions   Antihistamines, Chlorpheniramine-Type Other (See Comments)    Reaction:  Makes pt hyper    Atenolol Hypertension  Cefuroxime Axetil Other (See Comments)    Upset  stomach   Ciprofloxacin Other (See Comments)    Upset stomach   Co Q 10 [Coenzyme Q10] Other (See Comments)    Reaction:  GI upset    Crestor [Rosuvastatin Calcium] Other (See Comments)    Reaction:  Myalgias    Dextromethorphan-Guaifenesin Other (See Comments)    Reaction:  Made pt jittery    Epinephrine Hives   Ramipril Other (See Comments)    Upset stomach   Sulfamethoxazole-Trimethoprim Other (See Comments)    Upset stomach   Vitamin D Analogs Other (See Comments)    Reaction:  GI upset     Medications Reviewed Today     Reviewed by Charlton Haws, [facility] (Pharmacist) on 04/27/22 at 1525  Med List Status: <None>   Medication Order Taking? Sig Documenting Provider Last Dose Status Informant  acetaminophen (TYLENOL) 500 MG tablet [id number] Yes Take 500 mg by mouth every 6 (six) hours as needed. [provider] Taking Active Self  amitriptyline (ELAVIL) 10 MG tablet [id number] Yes TAKE ONE TABLET BY MOUTH AT BEDTIME [name], [name], MD Taking Active   apixaban (ELIQUIS) 5 MG TABS tablet [id number] Yes TAKE 1 TABLET(5 MG) BY MOUTH TWICE DAILY Wellington Hampshire, MD Taking Active   atorvastatin (LIPITOR) 10 MG tablet [id number] Yes Take 1 tablet (10 mg total) by mouth daily. [name], [name], MD Taking Active   Calcium Carbonate-Vitamin D (CALCIUM 600+D PO) [id number] Yes Take 1 tablet by mouth daily.  [provider] Taking Active Self  clobetasol ointment (TEMOVATE) 0.05 % [id number] Yes Apply 1 application topically 2 (two) times daily as needed. [provider] Taking Active Self  clorazepate (TRANXENE) 7.5 MG tablet [id number] Yes Take 1 tablet (7.5 mg total) by mouth daily as needed. [name], [name], MD Taking Active   cyanocobalamin (,VITAMIN B-12,) 1000 MCG/ML injection [id number] Yes Inject 1,000 mcg into the muscle every 30 (thirty) days.  [provider] Taking Active Self  diltiazem (CARDIZEM CD) 180 MG 24 hr capsule [id number]  Take 1 capsule (180  mg total) by mouth daily. Theora Gianotti, NP  Expired 03/09/22 2359   famotidine (PEPCID) 20 MG tablet [id number] Yes Take 20 mg by mouth 2 (two) times daily as needed for heartburn or indigestion. [provider] Taking Active   guaiFENesin (MUCINEX) 600 MG 12 hr tablet [id number] Yes Take 600 mg by mouth 2 (two) times daily as needed. [provider] Taking Active   irbesartan (AVAPRO) 300 MG tablet [id number] Yes TAKE 1 TABLET BY MOUTH ONCE DAILY [name], [name], MD Taking Active   loperamide (IMODIUM) 2 MG capsule [id number] Yes Take 2 mg by mouth as needed for diarrhea or loose stools. [provider] Taking Active   MAGNESIUM LACTATE PO [id number] Yes Take by mouth 2 (two) times daily. [provider] Taking Active   Methotrexate Sodium (METHOTREXATE, PF,) 50 MG/2ML injection [id number] Yes INJECT 0.6 ML (15MG) SUBCUTANEOUSLY ONCEA WEEK Rice, Resa Miner, MD Taking Active   raNITIdine HCl (ZANTAC PO) [id number] Yes Take by mouth. [provider] Taking Active   triamcinolone (NASACORT) 55 MCG/ACT AERO nasal inhaler [id number] Yes Place 2 sprays into the nose daily. [name], [name], MD Taking Active   TUBERCULIN SYR 1CC/26GX3/8" 26G X 3/8" 1 ML MISC [id number] Yes For injection of subcutaneous methotrexate once weekly Collier Salina, MD Taking Active             Patient Active Problem  List   Diagnosis Date Noted   Diarrhea 04/09/2022   Multinodular goiter 02/27/2022   Encounter for screening mammogram for breast cancer 01/23/2022   Recurrent UTI 11/25/2021   High risk medication use 10/07/2021   Atrial fibrillation with RVR ([place]) 04/28/2021   Hypertrophic toenail 04/22/2021   Pedal edema 03/24/2021   Seropositive rheumatoid arthritis ([place]) 11/25/2020   Bilateral hand pain 11/25/2020   Bilateral shoulder pain 10/16/2020   Joint pain 10/16/2020   Estrogen deficiency 04/30/2020   Dry mouth 03/15/2020   Osteoarthritis of right hip  01/02/2020   Groin pain, right 01/01/2020   Right thyroid nodule 05/12/2018   History of TIA (transient ischemic attack) 05/09/2018   Aortic insufficiency 09/10/2015   Stress reaction 09/08/2015   Encounter for Medicare annual wellness exam 02/28/2013   Bucket-handle tear of lateral meniscus of left knee as current injury 12/30/2012   Episodic atrial fibrillation ([place]) 11/17/2012   Special screening for malignant neoplasms, colon 01/21/2012   Hearing loss of both ears 01/14/2012   HEADACHE, CHRONIC 09/24/2010   B12 deficiency 03/17/2010   GERD (gastroesophageal reflux disease) 03/17/2010   POSTMENOPAUSAL STATUS 08/20/2008   Essential hypertension 07/19/2007   Hyperlipidemia 06/24/2007   IBS 06/24/2007   DERMATITIS, ATOPIC 06/24/2007   SKIN CANCER, HX OF 06/24/2007    Immunization History  Administered Date(s) Administered   Fluad Quad(high Dose 65+) 10/16/2020   Influenza Split 07/24/2011, 08/23/2014   Influenza Whole 08/23/2004, 07/24/2009, 08/06/2010, 08/05/2012   Influenza, High Dose Seasonal PF 08/11/2016, 08/27/2017, 08/02/2018, 09/05/2021   Influenza,inj,Quad PF,6+ Mos 08/16/2013, 08/07/2015   Influenza-Unspecified 09/05/2021   PFIZER(Purple Top)SARS-COV-2 Vaccination 12/15/2019, 01/05/2020, 10/25/2020   Pneumococcal Conjugate-13 08/24/2014   Pneumococcal Polysaccharide-23 08/20/2008   Td 03/20/2003, 02/28/2013   Zoster, Live 09/04/2015    Conditions to be addressed/monitored:  Hypertension, Hyperlipidemia, Atrial Fibrillation, Coronary Artery Disease, GERD, Anxiety, and Osteopenia, Rheumatoid arthritis, Recurrent UTIs  Care Plan : [place]  Updates made by Charlton Haws, [place] since 04/27/2022 12:00 AM     Problem: Hypertension, Hyperlipidemia, Atrial Fibrillation, Coronary Artery Disease, GERD, Anxiety, and Osteopenia, Rheumatoid arthritis, Recurrent UTIs   Priority: High     Long-Range Goal: Disease mgmt   Start Date: 10/28/2021  Expected End  Date: 04/28/2023  This Visit's Progress: On track  Recent Progress: On track  Priority: High  Note:   Current Barriers:  None identified  Pharmacist Clinical Goal(s):  Patient will contact provider office for questions/concerns as evidenced notation of same in electronic health record through collaboration with PharmD and provider.   Interventions: 1:1 collaboration with [name], [name], MD regarding development and update of comprehensive plan of care as evidenced by provider attestation and co-signature Inter-disciplinary care team collaboration (see longitudinal plan of care) Comprehensive medication review performed; medication list updated in electronic medical record  Hypertension (BP goal <140/90) -Controlled -BP generally at goal per home readings/office visits -Current home readings: 130/60 before meds in AM -Current treatment: Irbesartan 300 mg daily - Appropriate, Effective, Safe, Accessible Diltiazem 180 mg daily -Appropriate, Effective, Safe, Accessible -Medications previously tried: n/a  -Denies hypotensive/hypertensive symptoms -Educated on BP goals and benefits of medications for prevention of heart attack, stroke and kidney damage; Importance of home blood pressure monitoring; -Counseled to monitor BP at home 1-2x weekly -Recommended to continue current medication  Hyperlipidemia: (LDL goal < 70) -Not ideally controlled - LDL 108 (12/2021) is above goal; current control is reasonable given patient age and lack of ASCVD events; she reports persistent  cramps in hands and feet for which she takes a magnesium supplement -Hx aortic atherosclerosis (CXR 05/2021). Cardiac cath 02/2017 normal arteries. -Current treatment: Atorvastatin 10 mg daily -Appropriate, Effective, Safe, Accessible -Educated on Cholesterol goals; Benefits of statin for ASCVD risk reduction; Importance of limiting foods high in cholesterol; -Recommended to continue current medication  Atrial Fibrillation  (Goal: prevent stroke and major bleeding) -Controlled - pt reports improvement in dizzy spells since being on diltiazem; she denies bleeding issues -CHADSVASC: 6; Dx 04/2021  -Current treatment: Diltiazem CD 180 mg daily - Appropriate, Effective, Safe, Accessible Eliquis 5 mg BID -Appropriate, Effective, Safe, Accessible -Counseled on increased risk of stroke due to Afib and benefits of anticoagulation for stroke prevention; importance of adherence to anticoagulant exactly as prescribed; bleeding risk associated with Eliquis and importance of self-monitoring for signs/symptoms of bleeding; -Recommended to continue current medication  Rheumatoid arthritis (Goal: manage symptoms) -Controlled - pt reports MTX has improved symptoms, she is not taking folic acid due to GI upset and his discussed this with Dr Benjamine Mola who is monitoring more often due to this -Dx 09/2021; follows with rheumatology (Dr Benjamine Mola) -Current treatment  Amitriptyline 10 mg daily HS - Appropriate, Effective, Safe, Accessible Methotrexate 15 mg inj [place] weekly -Appropriate, Effective, Safe, Accessible Tramadol 50 mg TID prn -Appropriate, Effective, Safe, Accessible Tylenol 500 mg BID -Appropriate, Effective, Safe, Accessible -Counseled on importance of folic acid supplementation while taking methotrexate; discussed frequent lab monitoring with MTX and multiple drug interactions; advised to contact rheumatologist with any questions/concerns regarding MTX -Recommended to continue current medication  Depression/Anxiety (Goal: manage symptoms) -Controlled - pt reports amitriptyline is mainly for pain; she does not feel depressed; she does not use clorazepate often, typically only when driving with her husband -PHQ9: 0 (12/2021) - minimal depression -GAD7: not on file -Connected with PCP for mental health support -Current treatment: Amitriptyline 10 mg HS - Appropriate, Effective, Safe, Accessible Clorazepate 7.5 mg PRN -Appropriate,  Effective, Safe, Accessible -Recommended to continue current medication  GERD (Goal: manage symptoms) -Improved - pt reports Tums works well for her; she avoids PPIs due to DDI with MTX -Current treatment  Famotidine 20 mg BID prn- Appropriate, Effective, Safe, Accessible Tums PRN - Appropriate, Effective, Safe, Accessible -Medications previously tried: omeprazole  -Previously discussed drug interaction between PPI/Pepto Bismol and MTX (increased risk for MTX side effects), pt stopped omeprazole -Recommend to continue current medication  Osteopenia (Goal prevent fractures) -Controlled - DEXA scan up to date; pt endorses compliance with ca-vit D -Last DEXA Scan: 05/31/20   T-Score femoral neck: -1.1  T-Score lumbar spine: +1.2  10-year probability of major osteoporotic fracture: 11.3%  10-year probability of hip fracture: 2.5% -Patient is not a candidate for pharmacologic treatment -Current treatment  Calcium carbonate-Vitamin D - Appropriate, Effective, Safe, Accessible -Recommend [id number] units of vitamin D daily. Recommend 1200 mg of calcium daily from dietary and supplemental sources. Recommend weight-bearing and muscle strengthening exercises for building and maintaining bone density.  Recurrent UTI (Goal: reduce frequency of UTI) -Improving - pt has completed 63-monthcourse of trimethoprim prophylaxis, UTIs are less frequent, last symptomatic UTI was Jan 2023 -Pt follows with urology (Dr SBernardo Heater -Pt is drinking sugar-free cranberry juice daily to help with UTIs -Current treatment  Estrace cream - apply vaginally 2-3 times weekly - Appropriate, Effective, Safe, Accessible -Medications previously tried: trimethoprim x 3 mos -Recommend to continue current medication  Health Maintenance -Vaccine gaps: Shingrix, Covid booster -Pt declined additional covid vaccines  Patient Goals/Self-Care Activities Patient will:  -  take medications as prescribed as evidenced by patient report  and record review focus on medication adherence by pill box check blood pressure 1-2x weekly, document, and provide at future appointments -Use estrace cream 2-3 times weekly to help with UTIs       Medication Assistance: None required.  Patient affirms current coverage meets needs.  Compliance/Adherence/Medication fill history: Care Gaps: None  Star-Rating Drugs: Atorvastatin 10 mg - LF 10/08/21 x 90 ds; PDC 100% Irbesartan 300 mg - LF 09/09/21 x 90 ds; PDC 93%  Medication Access: Within the past 30 days, how often has patient missed a dose of medication? 0 Is a pillbox or other method used to improve adherence? Yes  Factors that may affect medication adherence? no barriers identified Are meds synced by current pharmacy? No  Are meds delivered by current pharmacy? No  Does patient experience delays in picking up medications due to transportation concerns? No   [facility] Services Reviewed: Is patient disadvantaged to use [facility]?: Yes  Current Rx insurance plan: [employer] Name and location of Current pharmacy: [facility] but uses [facility] for some more costly drugs using [id number] - [place], [place] [address] [place] [address] Phone: [phone] Fax: [phone]  [place], [place] [address] [place] [address] Phone: [phone] Fax: [phone]  [facility] services reviewed with patient today?: No Reason patient declined to change pharmacies: Disadvantaged due to insurance/mail order   Care Plan and Follow Up Patient Decision:  Patient agrees to Care Plan and Follow-up.  Plan: Telephone follow up appointment with care management team member scheduled for:  6 months  Charlene Brooke, PharmD, BCACP Clinical Pharmacist [facility] [phone]

## 2022-04-27 NOTE — Patient Instructions (Signed)
Visit Information  Phone number for Pharmacist: [phone]   Goals Addressed   None     Care Plan : [place]  Updates made by Charlton Haws, RPH since 04/27/2022 12:00 AM     Problem: Hypertension, Hyperlipidemia, Atrial Fibrillation, Coronary Artery Disease, GERD, Anxiety, and Osteopenia, Rheumatoid arthritis, Recurrent UTIs   Priority: High     Long-Range Goal: Disease mgmt   Start Date: 10/28/2021  Expected End Date: 04/28/2023  This Visit's Progress: On track  Recent Progress: On track  Priority: High  Note:   Current Barriers:  None identified  Pharmacist Clinical Goal(s):  Patient will contact provider office for questions/concerns as evidenced notation of same in electronic health record through collaboration with PharmD and provider.   Interventions: 1:1 collaboration with [name], [name], MD regarding development and update of comprehensive plan of care as evidenced by provider attestation and co-signature Inter-disciplinary care team collaboration (see longitudinal plan of care) Comprehensive medication review performed; medication list updated in electronic medical record  Hypertension (BP goal <140/90) -Controlled -BP generally at goal per home readings/office visits -Current home readings: 130/60 before meds in AM -Current treatment: Irbesartan 300 mg daily - Appropriate, Effective, Safe, Accessible Diltiazem 180 mg daily -Appropriate, Effective, Safe, Accessible -Medications previously tried: n/a  -Denies hypotensive/hypertensive symptoms -Educated on BP goals and benefits of medications for prevention of heart attack, stroke and kidney damage; Importance of home blood pressure monitoring; -Counseled to monitor BP at home 1-2x weekly -Recommended to continue current medication  Hyperlipidemia: (LDL goal < 70) -Not ideally controlled - LDL 108 (12/2021) is above goal; current control is reasonable given patient age and lack of ASCVD events;  she reports persistent cramps in hands and feet for which she takes a magnesium supplement -Hx aortic atherosclerosis (CXR 05/2021). Cardiac cath 02/2017 normal arteries. -Current treatment: Atorvastatin 10 mg daily -Appropriate, Effective, Safe, Accessible -Educated on Cholesterol goals; Benefits of statin for ASCVD risk reduction; Importance of limiting foods high in cholesterol; -Recommended to continue current medication  Atrial Fibrillation (Goal: prevent stroke and major bleeding) -Controlled - pt reports improvement in dizzy spells since being on diltiazem; she denies bleeding issues -CHADSVASC: 6; Dx 04/2021  -Current treatment: Diltiazem CD 180 mg daily - Appropriate, Effective, Safe, Accessible Eliquis 5 mg BID -Appropriate, Effective, Safe, Accessible -Counseled on increased risk of stroke due to Afib and benefits of anticoagulation for stroke prevention; importance of adherence to anticoagulant exactly as prescribed; bleeding risk associated with Eliquis and importance of self-monitoring for signs/symptoms of bleeding; -Recommended to continue current medication  Rheumatoid arthritis (Goal: manage symptoms) -Controlled - pt reports MTX has improved symptoms, she is not taking folic acid due to GI upset and his discussed this with Dr Benjamine Mola who is monitoring more often due to this -Dx 09/2021; follows with rheumatology (Dr Benjamine Mola) -Current treatment  Amitriptyline 10 mg daily HS - Appropriate, Effective, Safe, Accessible Methotrexate 15 mg inj [place] weekly -Appropriate, Effective, Safe, Accessible Tramadol 50 mg TID prn -Appropriate, Effective, Safe, Accessible Tylenol 500 mg BID -Appropriate, Effective, Safe, Accessible -Counseled on importance of folic acid supplementation while taking methotrexate; discussed frequent lab monitoring with MTX and multiple drug interactions; advised to contact rheumatologist with any questions/concerns regarding MTX -Recommended to continue current  medication  Depression/Anxiety (Goal: manage symptoms) -Controlled - pt reports amitriptyline is mainly for pain; she does not feel depressed; she does not use clorazepate often, typically only when driving with her husband -PHQ9: 0 (12/2021) - minimal depression -GAD7:  not on file -Connected with PCP for mental health support -Current treatment: Amitriptyline 10 mg HS - Appropriate, Effective, Safe, Accessible Clorazepate 7.5 mg PRN -Appropriate, Effective, Safe, Accessible -Recommended to continue current medication  GERD (Goal: manage symptoms) -Improved - pt reports Tums works well for her; she avoids PPIs due to DDI with MTX -Current treatment  Famotidine 20 mg BID prn- Appropriate, Effective, Safe, Accessible Tums PRN - Appropriate, Effective, Safe, Accessible -Medications previously tried: omeprazole  -Previously discussed drug interaction between PPI/Pepto Bismol and MTX (increased risk for MTX side effects), pt stopped omeprazole -Recommend to continue current medication  Osteopenia (Goal prevent fractures) -Controlled - DEXA scan up to date; pt endorses compliance with ca-vit D -Last DEXA Scan: 05/31/20   T-Score femoral neck: -1.1  T-Score lumbar spine: +1.2  10-year probability of major osteoporotic fracture: 11.3%  10-year probability of hip fracture: 2.5% -Patient is not a candidate for pharmacologic treatment -Current treatment  Calcium carbonate-Vitamin D - Appropriate, Effective, Safe, Accessible -Recommend [id number] units of vitamin D daily. Recommend 1200 mg of calcium daily from dietary and supplemental sources. Recommend weight-bearing and muscle strengthening exercises for building and maintaining bone density.  Recurrent UTI (Goal: reduce frequency of UTI) -Improving - pt has completed 32-monthcourse of trimethoprim prophylaxis, UTIs are less frequent, last symptomatic UTI was Jan 2023 -Pt follows with urology (Dr SBernardo Heater -Pt is drinking sugar-free cranberry  juice daily to help with UTIs -Current treatment  Estrace cream - apply vaginally 2-3 times weekly - Appropriate, Effective, Safe, Accessible -Medications previously tried: trimethoprim x 3 mos -Recommend to continue current medication  Health Maintenance -Vaccine gaps: Shingrix, Covid booster -Pt declined additional covid vaccines  Patient Goals/Self-Care Activities Patient will:  - take medications as prescribed as evidenced by patient report and record review focus on medication adherence by pill box check blood pressure 1-2x weekly, document, and provide at future appointments -Use estrace cream 2-3 times weekly to help with UTIs      Patient verbalizes understanding of instructions and care plan provided today and agrees to view in [facility] status and patient understanding of how to access instructions and care plan via MyChart confirmed with patient.    Telephone follow up appointment with pharmacy team member scheduled for: 6 months  LCharlene Brooke PharmD, BAscension [facility] HightsClinical Pharmacist LLymanPrimary Care at [phone]

## 2022-04-30 MED ORDER — ESTRADIOL 0.1 MG/GM VA CREA: TOPICAL_CREAM | VAGINAL | 1 refills | Status: DC

## 2022-04-30 NOTE — Addendum Note (Signed)
Addended by: Abbie Sons on: 04/30/2022 07:17 AM   Modules accepted: Orders

## 2022-05-08 DIAGNOSIS — M1712 Unilateral primary osteoarthritis, left knee: Secondary | ICD-10-CM | POA: Diagnosis not present

## 2022-05-14 ENCOUNTER — Ambulatory Visit (INDEPENDENT_AMBULATORY_CARE_PROVIDER_SITE_OTHER): Payer: Medicare Other | Admitting: Physician Assistant

## 2022-05-14 ENCOUNTER — Encounter: Payer: Self-pay | Admitting: Physician Assistant

## 2022-05-14 VITALS — BP 117/55 | HR 90 | Ht 64.0 in | Wt 183.0 lb

## 2022-05-14 DIAGNOSIS — N390 Urinary tract infection, site not specified: Secondary | ICD-10-CM | POA: Diagnosis not present

## 2022-05-14 DIAGNOSIS — R8271 Bacteriuria: Secondary | ICD-10-CM | POA: Diagnosis not present

## 2022-05-14 DIAGNOSIS — R3915 Urgency of urination: Secondary | ICD-10-CM

## 2022-05-14 DIAGNOSIS — Z87440 Personal history of urinary (tract) infections: Secondary | ICD-10-CM

## 2022-05-14 LAB — BLADDER SCAN AMB NON-IMAGING

## 2022-05-14 MED ORDER — MIRABEGRON ER 25 MG PO TB24: 25.0000 mg | ORAL_TABLET | Freq: Every day | ORAL | 0 refills | Status: DC

## 2022-05-14 NOTE — Progress Notes (Unsigned)
05/14/2022 2:06 PM   Tammy Manning Tammy Manning 01/22/1935 [id number]  CC: Chief Complaint  Patient presents with   Recurrent UTI   HPI: Tammy Manning is a 87 y.o. female with PMH recurrent UTI previously on suppressive Macrobid on d-mannose and vaginal estrogen who presents today for evaluation of possible UTI.   Today she reports she wanted to recheck her urine to make sure her UTI has cleared since she was last seen by Dr. Bernardo Heater in clinic.  She is concerned that she had a UTI at that time and that this may not have gone away.  She was asymptomatic at her last clinic visit on 03/04/2022.  Dr. Bernardo Heater obtained a urine sample for culture in case she developed urinary symptoms, which grew MDR Klebsiella pneumoniae.  She states she never developed urinary symptoms and denies dysuria today.  She does report a long history of urgency, frequency, nocturia x3, and mixed urge and stress incontinence.  She is unsure if this represents UTI.  In-office UA today positive for nitrites; urine microscopy with many bacteria.  PVR 56m.  PMH: Past Medical History:  Diagnosis Date   A-fib ([place]    Anginal pain ([facility]    Aortic insufficiency    a. 02/2020 Echo: EF 60-65%, no rwma, Gr1 DD, nl RV fxn, mildly dil LA, triv AI, Asc Ao 363m b. 04/2021 Echo: EF 60-65%, no rwma, GrI DD, nl RV fxn, mildly dil LA, AI not visualized. Mild-mod AoV sclerosis w/o stenosis.   Arthritis    Knees   B12 deficiency    Mild   Bucket-handle tear of lateral meniscus of left knee as current injury 12/30/2012   Cancer ([facility]   Skin, basal cell on nose and eyelid   DJD (degenerative joint disease)    Low back   Dyspepsia    GERD (gastroesophageal reflux disease)    Heart murmur    Heart palpitations    History of cardiac cath    a. 02/2017 Cath: Nl cors. Nl LV fxn. Abd Aogram w/o RAS.   HOH (hard of hearing)    Hyperlipidemia    Hypertension    IBS (irritable bowel syndrome)    Lactose intolerance    Mixed incontinence     PONV (postoperative nausea and vomiting)    Vulvar irritation    from urine pad, uses Triamcinolone oint.    Surgical History: Past Surgical History:  Procedure Laterality Date   ABDOMINAL AORTOGRAM N/A 03/15/2017   Procedure: Abdominal Aortogram;  Surgeon: MuWellington HampshireMD;  [facility];  Service: Cardiovascular;  Laterality: N/A;   ABDOMINAL HYSTERECTOMY  1982   Large fibroids and bleeding   APPENDECTOMY     BILATERAL SALPINGOOPHORECTOMY  1986   BONE GRAFT HIP ILIAC CREST     To Left arm   BREAST REDUCTION SURGERY     BREAST SURGERY     CARDIAC CATHETERIZATION     CATARACT EXTRACTION W/PHACO Left 07/07/2016   Procedure: CATARACT EXTRACTION PHACO AND INTRAOCULAR LENS PLACEMENT (IOC);  Surgeon: WiBirder RobsonMD;  Location: [facility];  Service: Ophthalmology;  Laterality: Left;  [place]:10 AP% 18.3 CDE 12.91 Fluid pak lot # [id number]   CATARACT EXTRACTION W/PHACO Right 07/28/2016   Procedure: CATARACT EXTRACTION PHACO AND INTRAOCULAR LENS PLACEMENT (IOC);  Surgeon: WiBirder RobsonMD;  Location: [facility];  Service: Ophthalmology;  Laterality: Right;  [place]:26 AP% 18.5 CDE 16.12 Fluid pack lot # [id number]   COLONOSCOPY  07/2016   Dr. MaThana Farr  DILATION AND CURETTAGE OF UTERUS     miscarragesx2   ESOPHAGOGASTRODUODENOSCOPY     Polyps   HEMORRHOID SURGERY  2006   KNEE ARTHROSCOPY WITH LATERAL MENISECTOMY Left 12/30/2012   Procedure: KNEE ARTHROSCOPY WITH LATERAL MENISECTOMY;  Surgeon: Johnny Bridge, MD;  Location: [facility];  Service: Orthopedics;  Laterality: Left;  LEFT KNEE SCOPE LATERAL MENISCECTOMY   LEFT HEART CATH AND CORONARY ANGIOGRAPHY N/A 03/15/2017   Procedure: Left Heart Cath and Coronary Angiography;  Surgeon: Wellington Hampshire, MD;  Location: [facility];  Service: Cardiovascular;  Laterality: N/A;   MOUTH SURGERY     skin cancer eyelid  2012   TONSILLECTOMY      Home Medications:  Allergies as of 05/14/2022        Reactions   Antihistamines, Chlorpheniramine-type Other (See Comments)   Reaction:  Makes pt hyper    Atenolol Hypertension   Cefuroxime Axetil Other (See Comments)   Upset stomach   Ciprofloxacin Other (See Comments)   Upset stomach   Co Q 10 [coenzyme Q10] Other (See Comments)   Reaction:  GI upset    Crestor [rosuvastatin Calcium] Other (See Comments)   Reaction:  Myalgias   Dextromethorphan-guaifenesin Other (See Comments)   Reaction:  Made pt jittery    Epinephrine Hives   Ramipril Other (See Comments)   Upset stomach   Sulfamethoxazole-trimethoprim Other (See Comments)   Upset stomach   Vitamin D Analogs Other (See Comments)   Reaction:  GI upset         Medication List        Accurate as of May 14, 2022  2:06 PM. If you have any questions, ask your nurse or doctor.          acetaminophen 500 MG tablet Commonly known as: TYLENOL Take 500 mg by mouth every 6 (six) hours as needed.   amitriptyline 10 MG tablet Commonly known as: ELAVIL TAKE ONE TABLET BY MOUTH AT BEDTIME   apixaban 5 MG Tabs tablet Commonly known as: Eliquis TAKE 1 TABLET(5 MG) BY MOUTH TWICE DAILY   atorvastatin 10 MG tablet Commonly known as: LIPITOR Take 1 tablet (10 mg total) by mouth daily.   CALCIUM 600+D PO Take 1 tablet by mouth daily.   clobetasol ointment 0.05 % Commonly known as: TEMOVATE Apply 1 application topically 2 (two) times daily as needed.   clorazepate 7.5 MG tablet Commonly known as: TRANXENE Take 1 tablet (7.5 mg total) by mouth daily as needed.   cyanocobalamin 1000 MCG/ML injection Commonly known as: (VITAMIN B-12) Inject 1,000 mcg into the muscle every 30 (thirty) days.   diltiazem 180 MG 24 hr capsule Commonly known as: CARDIZEM CD Take 1 capsule (180 mg total) by mouth daily.   estradiol 0.1 MG/GM vaginal cream Commonly known as: ESTRACE Apply a pea-sized amount to fingertip and wipe in vaginal introitus twice weekly   famotidine 20 MG  tablet Commonly known as: PEPCID Take 20 mg by mouth 2 (two) times daily as needed for heartburn or indigestion.   guaiFENesin 600 MG 12 hr tablet Commonly known as: MUCINEX Take 600 mg by mouth 2 (two) times daily as needed.   irbesartan 300 MG tablet Commonly known as: AVAPRO TAKE 1 TABLET BY MOUTH ONCE DAILY   loperamide 2 MG capsule Commonly known as: IMODIUM Take 2 mg by mouth as needed for diarrhea or loose stools.   MAGNESIUM LACTATE PO Take by mouth 2 (two) times daily.  methotrexate (PF) 50 MG/2ML injection INJECT 0.6 ML (15MG) SUBCUTANEOUSLY ONCEA WEEK   triamcinolone 55 MCG/ACT Aero nasal inhaler Commonly known as: NASACORT Place 2 sprays into the nose daily.   TUBERCULIN SYR 1CC/26GX3/8" 26G X 3/8" 1 ML Misc For injection of subcutaneous methotrexate once weekly   ZANTAC PO Take by mouth.        Allergies:  Allergies  Allergen Reactions   Antihistamines, Chlorpheniramine-Type Other (See Comments)    Reaction:  Makes pt hyper    Atenolol Hypertension   Cefuroxime Axetil Other (See Comments)    Upset stomach   Ciprofloxacin Other (See Comments)    Upset stomach   Co Q 10 [Coenzyme Q10] Other (See Comments)    Reaction:  GI upset    Crestor [Rosuvastatin Calcium] Other (See Comments)    Reaction:  Myalgias    Dextromethorphan-Guaifenesin Other (See Comments)    Reaction:  Made pt jittery    Epinephrine Hives   Ramipril Other (See Comments)    Upset stomach   Sulfamethoxazole-Trimethoprim Other (See Comments)    Upset stomach   Vitamin D Analogs Other (See Comments)    Reaction:  GI upset     Family History: Family History  Problem Relation Age of Onset   Cancer Mother        Colon   Heart disease Father        CAD   Diabetes Sister    Hypothyroidism Sister    Diabetes Brother    Leukemia Brother    Esophageal cancer Brother    Cancer Paternal Aunt        Breast   Diabetes Sister    Alzheimer's disease Sister    Diabetes Brother     Breast cancer Daughter     Social History:   reports that she quit smoking about 58 years ago. Her smoking use included cigarettes. She has a 12.00 pack-year smoking history. She has never used smokeless tobacco. She reports that she does not drink alcohol and does not use drugs.  Physical Exam: BP (!) 117/55   Pulse 90   Ht 5' 4"  (1.626 m)   Wt 183 lb (83 kg)   BMI 31.41 kg/m   Constitutional:  Alert and oriented, no acute distress, nontoxic appearing HEENT: [place], AT Cardiovascular: No clubbing, cyanosis, or edema Respiratory: Normal respiratory effort, no increased work of breathing Skin: No rashes, bruises or suspicious lesions Neurologic: Grossly intact, no focal deficits, moving all 4 extremities Psychiatric: Normal mood and affect  Laboratory Data: Results for orders placed or performed in visit on 05/14/22  BLADDER SCAN AMB NON-IMAGING  Result Value Ref Range   Scan Result 18m    Assessment & Plan:   1. Asymptomatic bacteriuria No dysuria or pyuria.  I think her most recent urine culture and current UA are consistent with asymptomatic bacteriuria.  We discussed that this does not require treatment in the absence of infective symptoms.  She expressed understanding.  2. Urinary urgency Offered her a trial of Myrbetriq and she accepted.  We will plan for symptom recheck and PVR in 4 weeks.  We discussed that I do not think the Myrbetriq will make a significant improvement in her stress incontinence. - Urinalysis, Complete - BLADDER SCAN AMB NON-IMAGING - mirabegron ER (MYRBETRIQ) 25 MG TB24 tablet; Take 1 tablet (25 mg total) by mouth daily.  Dispense: 28 tablet; Refill: 0   Return in about 4 weeks (around 06/11/2022) for Symptom recheck with PVR.  SDebroah Loop  PA-C  [place] [address] [place], [place] [address] [phone]

## 2022-05-15 LAB — URINALYSIS, COMPLETE
Bilirubin, UA: NEGATIVE
Glucose, UA: NEGATIVE
Ketones, UA: NEGATIVE
Leukocytes,UA: NEGATIVE
Nitrite, UA: POSITIVE — AB
Protein,UA: NEGATIVE
RBC, UA: NEGATIVE
Specific Gravity, UA: <1.005 — ABNORMAL LOW (ref 1.005–1.030)
Urobilinogen, Ur: 0.2 mg/dL (ref 0.2–1.0)
pH, UA: 5 (ref 5.0–7.5)

## 2022-05-15 LAB — MICROSCOPIC EXAMINATION

## 2022-06-03 ENCOUNTER — Ambulatory Visit (INDEPENDENT_AMBULATORY_CARE_PROVIDER_SITE_OTHER): Payer: Medicare Other

## 2022-06-03 DIAGNOSIS — E538 Deficiency of other specified B group vitamins: Secondary | ICD-10-CM | POA: Diagnosis not present

## 2022-06-03 MED ORDER — CYANOCOBALAMIN 1000 MCG/ML IJ SOLN
1000.0000 ug | Freq: Once | INTRAMUSCULAR | Status: AC
  Administered 2022-06-03: 1000 ug via INTRAMUSCULAR

## 2022-06-03 NOTE — Progress Notes (Signed)
Per orders of Dr. Melburn Hake, injection of Vitamin B 12 given by Ozzie Hoyle. Patient tolerated injection well.

## 2022-06-08 ENCOUNTER — Ambulatory Visit (INDEPENDENT_AMBULATORY_CARE_PROVIDER_SITE_OTHER): Payer: Medicare Other | Admitting: Physician Assistant

## 2022-06-08 ENCOUNTER — Encounter: Payer: Self-pay | Admitting: Physician Assistant

## 2022-06-08 VITALS — BP 124/76 | HR 80 | Ht 64.0 in | Wt 179.0 lb

## 2022-06-08 DIAGNOSIS — R3915 Urgency of urination: Secondary | ICD-10-CM

## 2022-06-08 LAB — BLADDER SCAN AMB NON-IMAGING

## 2022-06-08 MED ORDER — MIRABEGRON ER 25 MG PO TB24: 25.0000 mg | ORAL_TABLET | Freq: Every day | ORAL | 11 refills | Status: DC

## 2022-06-08 NOTE — Progress Notes (Signed)
06/08/2022 2:11 PM   [name] [birth year] [id number]  CC: Chief Complaint  Patient presents with   Follow-up   HPI: Tammy Manning is a 87 y.o. female with PMH recurrent UTI versus asymptomatic bacteriuria on d-mannose and vaginal estrogen and OAB wet with mixed urge and stress incontinence who presents today for symptom recheck on Myrbetriq 25 mg daily.   Today she reports significant symptomatic improvement on Myrbetriq.  She has taken 3 weeks of samples of the medication and reports she is no longer wearing pads during the daytime.  She has had improvement in her frequency and urgency.  She states her nocturia is stable at x3, however she thinks this is because her body is used to awakening that frequently and she does not truly have to void at these times.  PVR 34 mL.  PMH: Past Medical History:  Diagnosis Date   A-fib ([place])    Anginal pain ([place])    Aortic insufficiency    a. 02/2020 Echo: EF 60-65%, no rwma, Gr1 DD, nl RV fxn, mildly dil LA, triv AI, Asc Ao 13m; b. 04/2021 Echo: EF 60-65%, no rwma, GrI DD, nl RV fxn, mildly dil LA, AI not visualized. Mild-mod AoV sclerosis w/o stenosis.   Arthritis    Knees   B12 deficiency    Mild   Bucket-handle tear of lateral meniscus of left knee as current injury 12/30/2012   Cancer [place]    Skin, basal cell on nose and eyelid   DJD (degenerative joint disease)    Low back   Dyspepsia    GERD (gastroesophageal reflux disease)    Heart murmur    Heart palpitations    History of cardiac cath    a. 02/2017 Cath: Nl cors. Nl LV fxn. Abd Aogram w/o RAS.   HOH (hard of hearing)    Hyperlipidemia    Hypertension    IBS (irritable bowel syndrome)    Lactose intolerance    Mixed incontinence    PONV (postoperative nausea and vomiting)    Vulvar irritation    from urine pad, uses Triamcinolone oint.    Surgical History: Past Surgical History:  Procedure Laterality Date   ABDOMINAL AORTOGRAM N/A 03/15/2017   Procedure:  Abdominal Aortogram;  Surgeon: MWellington Hampshire MD;  [facility];  Service: Cardiovascular;  Laterality: N/A;   ABDOMINAL HYSTERECTOMY  1982   Large fibroids and bleeding   APPENDECTOMY     BILATERAL SALPINGOOPHORECTOMY  1986   BONE GRAFT HIP ILIAC CREST     To Left arm   BREAST REDUCTION SURGERY     BREAST SURGERY     CARDIAC CATHETERIZATION     CATARACT EXTRACTION W/PHACO Left 07/07/2016   Procedure: CATARACT EXTRACTION PHACO AND INTRAOCULAR LENS PLACEMENT (IOC);  Surgeon: WBirder Robson MD;  Location: [facility];  Service: Ophthalmology;  Laterality: Left;  [place]:10 AP% 18.3 CDE 12.91 Fluid pak lot # [id number]   CATARACT EXTRACTION W/PHACO Right 07/28/2016   Procedure: CATARACT EXTRACTION PHACO AND INTRAOCULAR LENS PLACEMENT (IOC);  Surgeon: WBirder Robson MD;  Location: [facility];  Service: Ophthalmology;  Laterality: Right;  [place]01:26 AP% 18.5 CDE 16.12 Fluid pack lot # [id number]   COLONOSCOPY  07/2016   Dr. MThana Farr  DILATION AND CURETTAGE OF UTERUS     miscarragesx2   ESOPHAGOGASTRODUODENOSCOPY     Polyps   HEMORRHOID SURGERY  2006   KNEE ARTHROSCOPY WITH LATERAL MENISECTOMY Left 12/30/2012   Procedure: KNEE ARTHROSCOPY WITH LATERAL  MENISECTOMY;  Surgeon: Johnny Bridge, MD;  Location: [facility];  Service: Orthopedics;  Laterality: Left;  LEFT KNEE SCOPE LATERAL MENISCECTOMY   LEFT HEART CATH AND CORONARY ANGIOGRAPHY N/A 03/15/2017   Procedure: Left Heart Cath and Coronary Angiography;  Surgeon: Wellington Hampshire, MD;  Location: [facility];  Service: Cardiovascular;  Laterality: N/A;   MOUTH SURGERY     skin cancer eyelid  2012   TONSILLECTOMY      Home Medications:  Allergies as of 06/08/2022       Reactions   Antihistamines, Chlorpheniramine-type Other (See Comments)   Reaction:  Makes pt hyper    Atenolol Hypertension   Cefuroxime Axetil Other (See Comments)   Upset stomach   Ciprofloxacin Other (See Comments)   Upset  stomach   Co Q 10 [coenzyme Q10] Other (See Comments)   Reaction:  GI upset    Crestor [rosuvastatin Calcium] Other (See Comments)   Reaction:  Myalgias   Dextromethorphan-guaifenesin Other (See Comments)   Reaction:  Made pt jittery    Epinephrine Hives   Ramipril Other (See Comments)   Upset stomach   Sulfamethoxazole-trimethoprim Other (See Comments)   Upset stomach   Vitamin D Analogs Other (See Comments)   Reaction:  GI upset         Medication List        Accurate as of June 08, 2022  2:11 PM. If you have any questions, ask your nurse or doctor.          STOP taking these medications    amitriptyline 10 MG tablet Commonly known as: ELAVIL Stopped by: [name], PA-C       TAKE these medications    acetaminophen 500 MG tablet Commonly known as: TYLENOL Take 500 mg by mouth every 6 (six) hours as needed.   apixaban 5 MG Tabs tablet Commonly known as: Eliquis TAKE 1 TABLET(5 MG) BY MOUTH TWICE DAILY   atorvastatin 10 MG tablet Commonly known as: LIPITOR Take 1 tablet (10 mg total) by mouth daily.   CALCIUM 600+D PO Take 1 tablet by mouth daily.   clobetasol ointment 0.05 % Commonly known as: TEMOVATE Apply 1 application topically 2 (two) times daily as needed.   clorazepate 7.5 MG tablet Commonly known as: TRANXENE Take 1 tablet (7.5 mg total) by mouth daily as needed.   cyanocobalamin 1000 MCG/ML injection Commonly known as: (VITAMIN B-12) Inject 1,000 mcg into the muscle every 30 (thirty) days.   diltiazem 180 MG 24 hr capsule Commonly known as: CARDIZEM CD Take 1 capsule (180 mg total) by mouth daily.   estradiol 0.1 MG/GM vaginal cream Commonly known as: ESTRACE Apply a pea-sized amount to fingertip and wipe in vaginal introitus twice weekly   famotidine 20 MG tablet Commonly known as: PEPCID Take 20 mg by mouth 2 (two) times daily as needed for heartburn or indigestion.   guaiFENesin 600 MG 12 hr tablet Commonly known  as: MUCINEX Take 600 mg by mouth 2 (two) times daily as needed.   irbesartan 300 MG tablet Commonly known as: AVAPRO TAKE 1 TABLET BY MOUTH ONCE DAILY   loperamide 2 MG capsule Commonly known as: IMODIUM Take 2 mg by mouth as needed for diarrhea or loose stools.   MAGNESIUM LACTATE PO Take by mouth 2 (two) times daily.   methotrexate 50 MG/2ML injection Inject 15 mg into the skin once a week. What changed: Another medication with the same name was removed. Continue taking  this medication, and follow the directions you see here. Changed by: [name], PA-C   mirabegron ER 25 MG Tb24 tablet Commonly known as: MYRBETRIQ Take 1 tablet (25 mg total) by mouth daily.   triamcinolone 55 MCG/ACT Aero nasal inhaler Commonly known as: NASACORT Place 2 sprays into the nose daily.   TUBERCULIN SYR 1CC/26GX3/8" 26G X 3/8" 1 ML Misc For injection of subcutaneous methotrexate once weekly   ZANTAC PO Take by mouth.        Allergies:  Allergies  Allergen Reactions   Antihistamines, Chlorpheniramine-Type Other (See Comments)    Reaction:  Makes pt hyper    Atenolol Hypertension   Cefuroxime Axetil Other (See Comments)    Upset stomach   Ciprofloxacin Other (See Comments)    Upset stomach   Co Q 10 [Coenzyme Q10] Other (See Comments)    Reaction:  GI upset    Crestor [Rosuvastatin Calcium] Other (See Comments)    Reaction:  Myalgias    Dextromethorphan-Guaifenesin Other (See Comments)    Reaction:  Made pt jittery    Epinephrine Hives   Ramipril Other (See Comments)    Upset stomach   Sulfamethoxazole-Trimethoprim Other (See Comments)    Upset stomach   Vitamin D Analogs Other (See Comments)    Reaction:  GI upset     Family History: Family History  Problem Relation Age of Onset   Cancer Mother        Colon   Heart disease Father        CAD   Diabetes Sister    Hypothyroidism Sister    Diabetes Brother    Leukemia Brother    Esophageal cancer Brother     Cancer Paternal Aunt        Breast   Diabetes Sister    Alzheimer's disease Sister    Diabetes Brother    Breast cancer Daughter     Social History:   reports that she quit smoking about 58 years ago. Her smoking use included cigarettes. She has a 12.00 pack-year smoking history. She has never used smokeless tobacco. She reports that she does not drink alcohol and does not use drugs.  Physical Exam: BP 124/76 (BP Location: Left Arm, Patient Position: Sitting, Cuff Size: Large)   Pulse 80   Ht 5' 4"  (1.626 m)   Wt 179 lb (81.2 kg)   BMI 30.73 kg/m   Constitutional:  Alert and oriented, no acute distress, nontoxic appearing HEENT: [place], AT Cardiovascular: No clubbing, cyanosis, or edema Respiratory: Normal respiratory effort, no increased work of breathing Skin: No rashes, bruises or suspicious lesions Neurologic: Grossly intact, no focal deficits, moving all 4 extremities Psychiatric: Normal mood and affect  Laboratory Data: Results for orders placed or performed in visit on 06/08/22  Bladder Scan (Post Void Residual) in office  Result Value Ref Range   Scan Result 75m    Assessment & Plan:   1. Urinary urgency Significant improvement in urgency, frequency, and leakage with stable nocturia on Myrbetriq 25 mg daily x3 weeks.  We will plan to continue this.  We will see her back in 1 year, sooner if her OAB symptoms worsen or she develops UTI symptoms. - Bladder Scan (Post Void Residual) in office - mirabegron ER (MYRBETRIQ) 25 MG TB24 tablet; Take 1 tablet (25 mg total) by mouth daily.  Dispense: 30 tablet; Refill: 11  Return in about 1 year (around 06/09/2023) for Annual OAB f/u with [facility].  SDebroah Loop PA-C  [facility] [address] [place], [place] [address] [phone]

## 2022-06-12 ENCOUNTER — Encounter: Payer: Self-pay | Admitting: Cardiovascular Disease

## 2022-06-15 ENCOUNTER — Telehealth: Payer: Self-pay

## 2022-06-15 MED ORDER — DILTIAZEM HCL ER COATED BEADS 240 MG PO CP24: 240.0000 mg | ORAL_CAPSULE | Freq: Every day | ORAL | 3 refills | Status: DC

## 2022-06-15 NOTE — Telephone Encounter (Signed)
Called and spoke with patients husband per DPR on file and informed him of Dr. Tyrell Antonio recommendations as noted below. Will route to Sabrina to schedule patients overdue 6 month follow up.

## 2022-06-19 ENCOUNTER — Other Ambulatory Visit: Payer: Self-pay | Admitting: Cardiovascular Disease

## 2022-06-19 DIAGNOSIS — I4891 Unspecified atrial fibrillation: Secondary | ICD-10-CM

## 2022-06-19 NOTE — Telephone Encounter (Signed)
Refill request

## 2022-06-19 NOTE — Telephone Encounter (Signed)
Prescription refill request for Eliquis received. Indication: Afib Last office visit: 12/11/21 Fletcher Anon)  Scr:0.74 (04/09/22)  Age: 87 Weight: 81.2kg  Appropriate dose and refill sent to requested pharmacy.

## 2022-06-21 NOTE — Progress Notes (Unsigned)
Cardiology Office Note    Date:  06/23/2022   ID:  Tammy Manning, DOB 16-Jan-1935, MRN [id number]  PCP:  Abner Greenspan, MD  Cardiologist:  Kathlyn Sacramento, MD  Electrophysiologist:  None   Chief Complaint: Follow-up  History of Present Illness:   Tammy Manning is a 87 y.o. female with history of PAF, aortic valve insufficiency, HTN with intolerance to multiple antihypertensive medications, HLD, normal coronary arteries by LHC in 2671, grade 1 diastolic dysfunction, mild aortic root dilatation,  B12 deficiency, and osteoarthritis who presents for follow-up of A-fib.   Cardiac cath in 02/2017 showed normal coronary arteries and normal LV systolic function.  Abdominal aortogram showed no evidence of renal artery stenosis.  Echo from 04/2018 showed an EF of 55 to 60% with increased wall thickness in the LV in a pattern of mild LVH, mild aortic valve regurgitation, calcified mitral annulus, mildly dilated left atrium and right ventricle.  Echo in 02/2020 demonstrated normal LV systolic function, trivial aortic insufficiency, and a mildly dilated aortic root measuring 39 mm.  In 04/2021, she was evaluated in the ED with lower extremity edema, fatigue, and palpitations and found to be in A-fib with RVR.  She converted to sinus rhythm on diltiazem and was noted to be mildly hypokalemic.  She was discharged on oral diltiazem and apixaban.  Echo in 04/2021 demonstrated an EF of 60 to 65%, no regional wall motion abnormalities, mild LVH, grade 1 diastolic dysfunction, normal RV systolic function and ventricular cavity size, normal PASP, mildly dilated left atrium, mild aortic valve sclerosis without evidence of stenosis, and an estimated right atrial pressure of 3 mmHg.  Aortic root noted to be normal in size and structure.  She had recurrent A-fib in mid 05/2021 with conversion to sinus rhythm with IV diltiazem.  She was seen in 10/2021 with increased lower extremity edema which was noted to be mild overall and  did not require investigation.  She was most recently seen in 11/2021 and was without symptoms of angina or decompensation.  She reported mild palpitations, mostly at night that were short-lived.  She was continued on diltiazem and apixaban.  She contacted our office on 06/12/2022 noting an increase in palpitations with recommendation to increase diltiazem to 240 mg daily and follow-up today.  She comes in today noting an increase in tachycardia palpitation burden consistent with prior A-fib.  Symptoms are more noticeable in the evening hours.  She is uncertain if this is accurate or if she is just not noticing episodes throughout the day due to distraction with stimuli surrounding her.  She is without symptoms of angina or decompensation, dyspnea, presyncope, or syncope.  No falls, hematochezia, or melena.  Since increasing her dose of diltiazem she has noted some dizziness and soft blood pressure.  With titration of diltiazem, she has noted an improvement in her palpitation burden.   Labs independently reviewed: 03/2022 - Hgb 11.3, PLT 326, albumin 3.8, AST/ALT normal, potassium 4.7, BUN 14, serum creatinine 0.74 12/2021 - TSH normal, TC 199, TG 157, HDL 59, LDL 108  Past Medical History:  Diagnosis Date   A-fib ([place])    Anginal pain ([place])    Aortic insufficiency    a. 02/2020 Echo: EF 60-65%, no rwma, Gr1 DD, nl RV fxn, mildly dil LA, triv AI, Asc Ao 35m; b. 04/2021 Echo: EF 60-65%, no rwma, GrI DD, nl RV fxn, mildly dil LA, AI not visualized. Mild-mod AoV sclerosis w/o stenosis.   Arthritis  Knees   B12 deficiency    Mild   Bucket-handle tear of lateral meniscus of left knee as current injury 12/30/2012   Cancer ([place])    Skin, basal cell on nose and eyelid   DJD (degenerative joint disease)    Low back   Dyspepsia    GERD (gastroesophageal reflux disease)    Heart murmur    Heart palpitations    History of cardiac cath    a. 02/2017 Cath: Nl cors. Nl LV fxn. Abd Aogram w/o RAS.    HOH (hard of hearing)    Hyperlipidemia    Hypertension    IBS (irritable bowel syndrome)    Lactose intolerance    Mixed incontinence    PONV (postoperative nausea and vomiting)    Vulvar irritation    from urine pad, uses Triamcinolone oint.    Past Surgical History:  Procedure Laterality Date   ABDOMINAL AORTOGRAM N/A 03/15/2017   Procedure: Abdominal Aortogram;  Surgeon: Wellington Hampshire, MD;  Location: [facility];  Service: Cardiovascular;  Laterality: N/A;   ABDOMINAL HYSTERECTOMY  1982   Large fibroids and bleeding   APPENDECTOMY     BILATERAL SALPINGOOPHORECTOMY  1986   BONE GRAFT HIP ILIAC CREST     To Left arm   BREAST REDUCTION SURGERY     BREAST SURGERY     CARDIAC CATHETERIZATION     CATARACT EXTRACTION W/PHACO Left 07/07/2016   Procedure: CATARACT EXTRACTION PHACO AND INTRAOCULAR LENS PLACEMENT (IOC);  Surgeon: Birder Robson, MD;  Location: [facility];  Service: Ophthalmology;  Laterality: Left;  [place] 01:10 AP% 18.3 CDE 12.91 Fluid pak lot # [id number]   CATARACT EXTRACTION W/PHACO Right 07/28/2016   Procedure: CATARACT EXTRACTION PHACO AND INTRAOCULAR LENS PLACEMENT (IOC);  Surgeon: Birder Robson, MD;  Location: [facility];  Service: Ophthalmology;  Laterality: Right;  [place] 01:26 AP% 18.5 CDE 16.12 Fluid pack lot # [id number]   COLONOSCOPY  07/2016   Dr. Thana Farr   DILATION AND CURETTAGE OF UTERUS     miscarragesx2   ESOPHAGOGASTRODUODENOSCOPY     Polyps   HEMORRHOID SURGERY  2006   KNEE ARTHROSCOPY WITH LATERAL MENISECTOMY Left 12/30/2012   Procedure: KNEE ARTHROSCOPY WITH LATERAL MENISECTOMY;  Surgeon: Johnny Bridge, MD;  Location: [place];  Service: Orthopedics;  Laterality: Left;  LEFT KNEE SCOPE LATERAL MENISCECTOMY   LEFT HEART CATH AND CORONARY ANGIOGRAPHY N/A 03/15/2017   Procedure: Left Heart Cath and Coronary Angiography;  Surgeon: Wellington Hampshire, MD;  Location: [facility];  Service: Cardiovascular;  Laterality: N/A;    MOUTH SURGERY     skin cancer eyelid  2012   TONSILLECTOMY      Current Medications: Current Meds  Medication Sig   acetaminophen (TYLENOL) 500 MG tablet Take 500 mg by mouth every 6 (six) hours as needed.   atorvastatin (LIPITOR) 10 MG tablet Take 1 tablet (10 mg total) by mouth daily.   Calcium Carbonate-Vitamin D (CALCIUM 600+D PO) Take 1 tablet by mouth daily.    clobetasol ointment (TEMOVATE) 7.84 % Apply 1 application topically 2 (two) times daily as needed.   clorazepate (TRANXENE) 7.5 MG tablet Take 1 tablet (7.5 mg total) by mouth daily as needed.   cyanocobalamin (,VITAMIN B-12,) 1000 MCG/ML injection Inject 1,000 mcg into the muscle every 30 (thirty) days.    diltiazem (CARDIZEM CD) 180 MG 24 hr capsule Take 1 capsule (180 mg total) by mouth daily.   ELIQUIS 5 MG TABS tablet  TAKE 1 TABLET BY MOUTH TWICE DAILY   estradiol (ESTRACE) 0.1 MG/GM vaginal cream Apply a pea-sized amount to fingertip and wipe in vaginal introitus twice weekly   famotidine (PEPCID) 20 MG tablet Take 20 mg by mouth 2 (two) times daily as needed for heartburn or indigestion.   guaiFENesin (MUCINEX) 600 MG 12 hr tablet Take 600 mg by mouth 2 (two) times daily as needed.   irbesartan (AVAPRO) 300 MG tablet TAKE 1 TABLET BY MOUTH ONCE DAILY   loperamide (IMODIUM) 2 MG capsule Take 2 mg by mouth as needed for diarrhea or loose stools.   MAGNESIUM LACTATE PO Take by mouth 2 (two) times daily.   methotrexate 50 MG/2ML injection Inject 15 mg into the skin once a week.   raNITIdine HCl (ZANTAC PO) Take by mouth.   triamcinolone (NASACORT) 55 MCG/ACT AERO nasal inhaler Place 2 sprays into the nose daily.   TUBERCULIN SYR 1CC/26GX3/8" 26G X 3/8" 1 ML MISC For injection of subcutaneous methotrexate once weekly   [DISCONTINUED] diltiazem (CARDIZEM CD) 120 MG 24 hr capsule Take 1 capsule (120 mg total) by mouth daily.   [DISCONTINUED] diltiazem (CARDIZEM CD) 240 MG 24 hr capsule Take 1 capsule (240 mg total) by mouth  daily.    Allergies:   Antihistamines, chlorpheniramine-type; Atenolol; Cefuroxime axetil; Ciprofloxacin; Co q 10 [coenzyme q10]; Crestor [rosuvastatin calcium]; Dextromethorphan-guaifenesin; Epinephrine; Ramipril; Sulfamethoxazole-trimethoprim; and Vitamin d analogs   Social History   Socioeconomic History   Marital status: Married    Spouse name: Not on file   Number of children: 2   Years of education: Not on file   Highest education level: Not on file  Occupational History   Occupation: Architect: RETIRED  Tobacco Use   Smoking status: Former    Packs/day: 1.00    Years: 12.00    Total pack years: 12.00    Types: Cigarettes    Quit date: 11/24/1963    Years since quitting: 58.6   Smokeless tobacco: Never  Vaping Use   Vaping Use: Never used  Substance and Sexual Activity   Alcohol use: No    Alcohol/week: 0.0 standard drinks of alcohol   Drug use: No   Sexual activity: Yes  Other Topics Concern   Not on file  Social History Narrative   Metallurgist.     Social Determinants of Health   Financial Resource Strain: Low Risk  (12/24/2021)   Overall Financial Resource Strain (CARDIA)    Difficulty of Paying Living Expenses: Not hard at all  Food Insecurity: No Food Insecurity (12/24/2021)   Hunger Vital Sign    Worried About Running Out of Food in the Last Year: Never true    Ran Out of Food in the Last Year: Never true  Transportation Needs: No Transportation Needs (12/24/2021)   PRAPARE - Hydrologist (Medical): No    Lack of Transportation (Non-Medical): No  Physical Activity: Insufficiently Active (12/24/2021)   Exercise Vital Sign    Days of Exercise per Week: 7 days    Minutes of Exercise per Session: 10 min  Stress: No Stress Concern Present (12/24/2021)   [place]    Feeling of Stress : Only a little  Social Connections: Moderately Isolated (12/24/2021)    Social Connection and Isolation Panel [NHANES]    Frequency of Communication with Friends and Family: More than three times a week    Frequency of  Social Gatherings with Friends and Family: More than three times a week    Attends Religious Services: Never    Marine scientist or Organizations: No    Attends Music therapist: Never    Marital Status: Married     Family History:  The patient's family history includes Alzheimer's disease in her sister; Breast cancer in her daughter; Cancer in her mother and paternal aunt; Diabetes in her brother, brother, sister, and sister; Esophageal cancer in her brother; Heart disease in her father; Hypothyroidism in her sister; Leukemia in her brother.  ROS:   12-point review of systems is negative unless otherwise noted in HPI.   EKGs/Labs/Other Studies Reviewed:    Studies reviewed were summarized above. The additional studies were reviewed today:  2D echo 04/29/2021: 1. Left ventricular ejection fraction, by estimation, is 60 to 65%. The  left ventricle has normal function. The left ventricle has no regional  wall motion abnormalities. There is mild left ventricular hypertrophy.  Left ventricular diastolic parameters  are consistent with Grade I diastolic dysfunction (impaired relaxation).   2. Right ventricular systolic function is normal. The right ventricular  size is normal. There is normal pulmonary artery systolic pressure.   3. Left atrial size was mildly dilated.   4. The mitral valve is normal in structure. No evidence of mitral valve  regurgitation. No evidence of mitral stenosis.   5. The aortic valve is normal in structure. Aortic valve regurgitation is  not visualized. Mild to moderate aortic valve sclerosis/calcification is  present, without any evidence of aortic stenosis.   6. The inferior vena cava is normal in size with greater than 50%  respiratory variability, suggesting right atrial pressure of 3  mmHg. __________   2D echo 03/14/2020: 1. Left ventricular ejection fraction, by estimation, is 60 to 65%. The  left ventricle has normal function. The left ventricle has no regional  wall motion abnormalities. Left ventricular diastolic parameters are  consistent with Grade I diastolic  dysfunction (impaired relaxation).   2. Right ventricular systolic function is normal. The right ventricular  size is normal. There is mildly elevated pulmonary artery systolic  pressure.   3. Left atrial size was mildly dilated.   4. The mitral valve is grossly normal. No evidence of mitral valve  regurgitation.   5. The aortic valve is tricuspid. Aortic valve regurgitation is trivial.  Mild aortic valve sclerosis is present, with no evidence of aortic valve  stenosis.   6. Aortic dilatation noted. There is mild dilatation of the ascending  aorta measuring 39 mm.   7. The inferior vena cava is normal in size with greater than 50%  respiratory variability, suggesting right atrial pressure of 3 mmHg. __________  2D echo 05/10/2018: - Left ventricle: The cavity size was normal. Wall thickness was    increased in a pattern of mild LVH. Systolic function was normal.    The estimated ejection fraction was in the range of 55% to 60%.    Wall motion was normal; there were no regional wall motion    abnormalities. Doppler parameters are consistent with abnormal    left ventricular relaxation (grade 1 diastolic dysfunction).    Doppler parameters are consistent with high ventricular filling    pressure.  - Aortic valve: There was mild regurgitation.  - Mitral valve: Calcified annulus.  - Left atrium: The atrium was mildly dilated.  - Right ventricle: The cavity size was mildly dilated. Systolic    function was  normal. __________  2D echo 02/18/2018: - Left ventricle: The cavity size was normal. Systolic function was    normal. The estimated ejection fraction was in the range of 60%    to 65%. Wall  motion was normal; there were no regional wall    motion abnormalities. Doppler parameters are consistent with    abnormal left ventricular relaxation (grade 1 diastolic    dysfunction).  - Aortic valve: There was mild regurgitation.  - Mitral valve: There was mild regurgitation.  - Left atrium: The atrium was normal in size.  - Right ventricle: Systolic function was normal.  - Pulmonary arteries: Systolic pressure was within the normal    range. __________  LHC/abdominal aortogram 02/2017: The left ventricular systolic function is normal. LV end diastolic pressure is mildly elevated. The left ventricular ejection fraction is 55-65% by visual estimate.   1. Normal coronary arteries. 2. Normal LV systolic function and mildly elevated left ventricular end-diastolic pressure.  3. Mildly calcified aortic valve with no evidence of significant stenosis. 4. Abdominal aortogram was performed to evaluate for uncontrolled hypertension. It showed no evidence of renal artery stenosis, no evidence of aortic aneurysm or significant iliac disease.   Recommendations: Recommend blood pressure control. Uncontrolled hypertension is likely the culprit for her symptoms. __________   2D echo 09/2015: - Left ventricle: The cavity size was normal. There was mild    concentric hypertrophy. Systolic function was normal. The    estimated ejection fraction was in the range of 60% to 65%. Wall    motion was normal; there were no regional wall motion    abnormalities. Doppler parameters are consistent with abnormal    left ventricular relaxation (grade 1 diastolic dysfunction).  - Aortic valve: There was mild regurgitation.  - Mitral valve: Mildly calcified annulus. There was mild    regurgitation.  - Left atrium: The atrium was mildly dilated.  - Pulmonary arteries: Systolic pressure was within the normal    range.    EKG:  EKG is ordered today.  The EKG ordered today demonstrates NSR, 86 bpm, baseline  wandering, no acute ST-T changes, no significant change when compared to prior tracing  Recent Labs: 04/09/2022: ALT 11 06/23/2022: BUN 21; Creatinine, Ser 0.79; Hemoglobin 11.2; Magnesium 2.1; Platelets 302; Potassium 3.9; Sodium 139; TSH 0.779  Recent Lipid Panel    Component Value Date/Time   CHOL 199 01/16/2022 0835   CHOL 196 02/02/2020 1348   TRIG 157.0 (H) 01/16/2022 0835   HDL 59.40 01/16/2022 0835   HDL 80 02/02/2020 1348   CHOLHDL 3 01/16/2022 0835   VLDL 31.4 01/16/2022 0835   LDLCALC 108 (H) 01/16/2022 0835   LDLCALC 93 02/02/2020 1348   LDLDIRECT 106 (H) 02/02/2020 1348   LDLDIRECT 105.9 02/08/2013 1050    PHYSICAL EXAM:    VS:  BP (!) 106/58 (BP Location: Left Arm, Patient Position: Sitting, Cuff Size: Normal)   Pulse 86   Ht 5' 4"  (1.626 m)   Wt 183 lb (83 kg)   SpO2 98%   BMI 31.41 kg/m   BMI: Body mass index is 31.41 kg/m.  Physical Exam Vitals reviewed.  Constitutional:      Appearance: She is well-developed.  HENT:     Head: Normocephalic and atraumatic.  Eyes:     General:        Right eye: No discharge.        Left eye: No discharge.  Cardiovascular:     Rate and Rhythm: Normal rate  and regular rhythm.     Pulses:          Posterior tibial pulses are 2+ on the right side and 2+ on the left side.     Heart sounds: Normal heart sounds, S1 normal and S2 normal. Heart sounds not distant. No midsystolic click and no opening snap. No murmur heard.    No friction rub.  Pulmonary:     Effort: Pulmonary effort is normal. No respiratory distress.     Breath sounds: Normal breath sounds. No decreased breath sounds, wheezing or rales.  Chest:     Chest wall: No tenderness.  Abdominal:     General: There is no distension.  Musculoskeletal:     Cervical back: Normal range of motion.     Right lower leg: No edema.     Left lower leg: No edema.  Skin:    General: Skin is warm and dry.     Nails: There is no clubbing.  Neurological:     Mental Status:  She is alert and oriented to person, place, and time.  Psychiatric:        Speech: Speech normal.        Behavior: Behavior normal.        Thought Content: Thought content normal.        Judgment: Judgment normal.     Wt Readings from Last 3 Encounters:  06/23/22 183 lb (83 kg)  06/08/22 179 lb (81.2 kg)  05/14/22 183 lb (83 kg)     ASSESSMENT & PLAN:   PAF: Maintaining sinus rhythm currently.  She does note an increase in palpitation burden, particularly in the evening hours.  With titration of diltiazem she noted an improvement in this.  However, she then developed some dizziness and lethargy with associated low blood pressure.  Given the relative hypotension, we will reduce diltiazem to 180 mg daily.  Place a Zio patch to quantify A-fib burden/evaluate for silent A-fib.  Based on Zio patch results, we could consider retitration of diltiazem back to 240 mg daily with subsequent decreasing of irbesartan in an effort to minimize low blood pressure.  CHA2DS2-VASc at least 4 (HTN, age x2, sex category).  She remains on apixaban 5 mg twice daily (does not meet reduced dosing criteria).  Check BMP, CBC, TSH, and magnesium.  HTN: Blood pressure is on the soft side today following titration of diltiazem.  Given this, we will reduce diltiazem to 180 mg daily while we await updated Zio patch monitoring.  Pending Zio patch results, we could consider going back up on diltiazem to 240 mg daily with subsequent decreased dose of irbesartan in an effort to minimize low blood pressure/dizziness.  HLD: LDL 108 in 12/2021.  She remains on atorvastatin 10 mg daily.  Followed by PCP.   Disposition: F/u with Dr. Fletcher Anon or an APP in 2 months.   Medication Adjustments/Labs and Tests Ordered: Current medicines are reviewed at length with the patient today.  Concerns regarding medicines are outlined above. Medication changes, Labs and Tests ordered today are summarized above and listed in the Patient Instructions  accessible in Encounters.   Signed, [name], PA-C 06/23/2022 3:33 PM     [address] [place], [place] [address] [phone]

## 2022-06-23 ENCOUNTER — Ambulatory Visit (INDEPENDENT_AMBULATORY_CARE_PROVIDER_SITE_OTHER): Payer: Medicare Other | Admitting: Physician Assistant

## 2022-06-23 ENCOUNTER — Ambulatory Visit (INDEPENDENT_AMBULATORY_CARE_PROVIDER_SITE_OTHER): Payer: Medicare Other

## 2022-06-23 ENCOUNTER — Other Ambulatory Visit
Admission: RE | Admit: 2022-06-23 | Discharge: 2022-06-23 | Disposition: A | Discharge status: Home / Self Care | Payer: Medicare Other | Source: Ambulatory Visit | Attending: Physician Assistant | Admitting: Physician Assistant

## 2022-06-23 ENCOUNTER — Encounter: Payer: Self-pay | Admitting: Physician Assistant

## 2022-06-23 VITALS — BP 106/58 | HR 86 | Ht 64.0 in | Wt 183.0 lb

## 2022-06-23 DIAGNOSIS — I480 Paroxysmal atrial fibrillation: Secondary | ICD-10-CM

## 2022-06-23 DIAGNOSIS — E785 Hyperlipidemia, unspecified: Secondary | ICD-10-CM

## 2022-06-23 DIAGNOSIS — I10 Essential (primary) hypertension: Secondary | ICD-10-CM | POA: Insufficient documentation

## 2022-06-23 DIAGNOSIS — I351 Nonrheumatic aortic (valve) insufficiency: Secondary | ICD-10-CM

## 2022-06-23 LAB — CBC
HCT: 35.6 % — ABNORMAL LOW (ref 36.0–46.0)
Hemoglobin: 11.2 g/dL — ABNORMAL LOW (ref 12.0–15.0)
MCH: 32.4 pg (ref 26.0–34.0)
MCHC: 31.5 g/dL (ref 30.0–36.0)
MCV: 102.9 fL — ABNORMAL HIGH (ref 80.0–100.0)
Platelets: 302 10*3/uL (ref 150–400)
RBC: 3.46 MIL/uL — ABNORMAL LOW (ref 3.87–5.11)
RDW: 15.4 % (ref 11.5–15.5)
WBC: 6.4 10*3/uL (ref 4.0–10.5)
nRBC: 0 % (ref 0.0–0.2)

## 2022-06-23 LAB — BASIC METABOLIC PANEL
Anion gap: 7 (ref 5–15)
BUN: 21 mg/dL (ref 8–23)
CO2: 24 mmol/L (ref 22–32)
Calcium: 9.4 mg/dL (ref 8.9–10.3)
Chloride: 108 mmol/L (ref 98–111)
Creatinine, Ser: 0.79 mg/dL (ref 0.44–1.00)
GFR, Estimated: >60 mL/min (ref >60)
Glucose, Bld: 106 mg/dL — ABNORMAL HIGH (ref 70–99)
Potassium: 3.9 mmol/L (ref 3.5–5.1)
Sodium: 139 mmol/L (ref 135–145)

## 2022-06-23 LAB — TSH: TSH: 0.779 u[IU]/mL (ref 0.350–4.500)

## 2022-06-23 LAB — MAGNESIUM: Magnesium: 2.1 mg/dL (ref 1.7–2.4)

## 2022-06-23 MED ORDER — DILTIAZEM HCL ER COATED BEADS 180 MG PO CP24: 180.0000 mg | ORAL_CAPSULE | Freq: Every day | ORAL | 3 refills | Status: DC

## 2022-06-23 MED ORDER — DILTIAZEM HCL ER COATED BEADS 120 MG PO CP24: 120.0000 mg | ORAL_CAPSULE | Freq: Every day | ORAL | 3 refills | Status: DC

## 2022-06-23 NOTE — Patient Instructions (Addendum)
Medication Instructions:  Your physician has recommended you make the following change in your medication:   DECREASE Cardizem CD (Diltiazem) 180 mg once daily   *If you need a refill on your cardiac medications before your next appointment, please call your pharmacy*   Lab Work: CBC, BMET, Mag, and TSH over at the [facility] entrance. Stop at registration desk to check in.   If you have labs (blood work) drawn today and your tests are completely normal, you will receive your results only by: [place] (if you have MyChart) OR A paper copy in the mail If you have any lab test that is abnormal or we need to change your treatment, we will call you to review the results.   Testing/Procedures: Your physician has recommended that you wear a Zio monitor for 14 days.   This monitor is a medical device that records the heart's electrical activity. Doctors most often use these monitors to diagnose arrhythmias. Arrhythmias are problems with the speed or rhythm of the heartbeat. The monitor is a small device applied to your chest. You can wear one while you do your normal daily activities. While wearing this monitor if you have any symptoms to push the button and record what you felt. Once you have worn this monitor for the period of time provider prescribed (Usually 14 days), you will return the monitor device in the postage paid box. Once it is returned they will download the data collected and provide [place] with a report which the provider will then review and we will call you with those results. Important tips:  Avoid showering during the first 24 hours of wearing the monitor. Avoid excessive sweating to help maximize wear time. Do not submerge the device, no hot tubs, and no swimming pools. Keep any lotions or oils away from the patch. After 24 hours you may shower with the patch on. Take brief showers with your back facing the shower head.  Do not remove patch once it has been placed  because that will interrupt data and decrease adhesive wear time. Push the button when you have any symptoms and write down what you were feeling. Once you have completed wearing your monitor, remove and place into box which has postage paid and place in your outgoing mailbox.  If for some reason you have misplaced your box then call our office and we can provide another box and/or mail it off for you.      Follow-Up: At [facility], you and your health needs are our priority.  As part of our continuing mission to provide you with exceptional heart care, we have created designated Provider Care Teams.  These Care Teams include your primary Cardiologist (physician) and Advanced Practice Providers (APPs -  Physician Assistants and Nurse Practitioners) who all work together to provide you with the care you need, when you need it.   Your next appointment:   2 month(s)  The format for your next appointment:   In Person  Provider:   Kathlyn Sacramento, MD or [name], PA-C       Important Information About [place]

## 2022-06-26 DIAGNOSIS — I480 Paroxysmal atrial fibrillation: Secondary | ICD-10-CM

## 2022-06-26 DIAGNOSIS — I351 Nonrheumatic aortic (valve) insufficiency: Secondary | ICD-10-CM

## 2022-07-07 ENCOUNTER — Encounter: Payer: Self-pay | Admitting: Family Medicine

## 2022-07-08 MED ORDER — CLORAZEPATE DIPOTASSIUM 7.5 MG PO TABS: 7.5000 mg | ORAL_TABLET | Freq: Every day | ORAL | 0 refills | Status: DC | PRN

## 2022-07-10 DIAGNOSIS — M25561 Pain in right knee: Secondary | ICD-10-CM | POA: Diagnosis not present

## 2022-07-14 DIAGNOSIS — I480 Paroxysmal atrial fibrillation: Secondary | ICD-10-CM | POA: Diagnosis not present

## 2022-07-14 DIAGNOSIS — I351 Nonrheumatic aortic (valve) insufficiency: Secondary | ICD-10-CM | POA: Diagnosis not present

## 2022-07-24 ENCOUNTER — Telehealth: Payer: Self-pay

## 2022-07-24 DIAGNOSIS — M25561 Pain in right knee: Secondary | ICD-10-CM | POA: Diagnosis not present

## 2022-07-24 NOTE — Telephone Encounter (Signed)
-----   Message from Rise Mu, PA-C sent at 07/24/2022  7:59 AM EDT ----- Outpatient cardiac monitoring showed a predominant rhythm of sinus with an average rate of 75 bpm (range 55 to 190 bpm), 1 run of NSVT occurred (fast rhythm coming from the bottom portion of the heart that was not sustained) lasting just 7 beats, 13 episodes of SVT (fast rhythm coming from the top portion of the heart that was not sustained) with the longest episode lasting 7 beats.  Overall, reassuring heart monitor without evidence of sustained arrhythmias.  No evidence of further A-fib.  If palpitations remain reasonably controlled, would defer medication escalation at this time and reassess at follow-up.

## 2022-07-24 NOTE — Telephone Encounter (Signed)
Spoke w/ pt.  Advised her of Ryan's recommendation. She reports that she was not symptomatic while wearing the monitor, but was after she took it off.  She reports that overall she is feeling pretty good, but will discuss at her upcoming f/u appt w/ Ryan next week.  She reports that when she feels them coming on, she will take a "tranquilizer" and feel better.

## 2022-07-28 ENCOUNTER — Encounter: Payer: Self-pay | Admitting: Family Medicine

## 2022-07-28 NOTE — Telephone Encounter (Signed)
Med not on med list, will route to PCP for review

## 2022-07-29 MED ORDER — AMITRIPTYLINE HCL 10 MG PO TABS: 10.0000 mg | ORAL_TABLET | Freq: Every day | ORAL | 3 refills | Status: DC

## 2022-07-30 DIAGNOSIS — Z23 Encounter for immunization: Secondary | ICD-10-CM | POA: Diagnosis not present

## 2022-08-06 ENCOUNTER — Telehealth: Payer: Self-pay

## 2022-08-06 ENCOUNTER — Ambulatory Visit (INDEPENDENT_AMBULATORY_CARE_PROVIDER_SITE_OTHER): Payer: Medicare Other

## 2022-08-06 DIAGNOSIS — E538 Deficiency of other specified B group vitamins: Secondary | ICD-10-CM | POA: Diagnosis not present

## 2022-08-06 MED ORDER — CYANOCOBALAMIN 1000 MCG/ML IJ SOLN
1000.0000 ug | Freq: Once | INTRAMUSCULAR | Status: AC
  Administered 2022-08-06: 1000 ug via INTRAMUSCULAR

## 2022-08-06 NOTE — [facility] Notes (Signed)
Per orders of Dr. Glori Bickers, injection of vit I17 given by Brenton Grills. Patient tolerated injection well.

## 2022-08-06 NOTE — Progress Notes (Signed)
Chronic Care Management Pharmacy Assistant   Name: Tammy Manning  MRN: [id number] DOB: 08/17/1935  Reason for Encounter: CCM (General Adherence)  Recent office visits:  07/28/22 Start Amitriptyline 10 mg for arm pain  Recent consult visits:  06/23/22 Long Term Monitor - Overall, reassuring heart monitor without evidence of sustained arrhythmias.  No evidence of further A-fib.   06/23/22 Tammy Faith, PA (Cardiology) Lab results "Potassium normal Renal function normal Random glucose okay Blood count mildly low, though stable and consistent with prior readings TSH normal Magnesium normal and at goal No indication to supplement with magnesium.  Continue with treatment plan outlined at office visit." Change: Diltiazem 180 mg vs. 240 mg FU 2 months 06/12/22 Cardiology Telephone: Increase Diltiazem ER to 240 mg once daily due to skipping hear beats 06/08/22 Tammy Loop, PA (Urology) Asymptomatic bacteriuria Ordered: Bladder Scan Change: Methotrexate 15 mg vs. 50 mg Stop: amitriptyline (ELAVIL) 10 MG tablet FU 1 year 06/03/22 Administered: cyanocobalamin ((VITAMIN B-12)) injection 1,000 mcg 05/14/22 Tammy Loop, PA (Urology) Asymptomatic bacteriuria Stop: Mirabegron 25 mg FU 4 weeks  Hospital visits:  None in previous 6 months  Medications: Outpatient Encounter Medications as of 08/06/2022  Medication Sig   acetaminophen (TYLENOL) 500 MG tablet Take 500 mg by mouth every 6 (six) hours as needed.   amitriptyline (ELAVIL) 10 MG tablet Take 1 tablet (10 mg total) by mouth at bedtime.   atorvastatin (LIPITOR) 10 MG tablet Take 1 tablet (10 mg total) by mouth daily.   Calcium Carbonate-Vitamin D (CALCIUM 600+D PO) Take 1 tablet by mouth daily.    clobetasol ointment (TEMOVATE) 2.58 % Apply 1 application topically 2 (two) times daily as needed.   clorazepate (TRANXENE) 7.5 MG tablet Take 1 tablet (7.5 mg total) by mouth daily as needed.   cyanocobalamin (,VITAMIN B-12,) 1000  MCG/ML injection Inject 1,000 mcg into the muscle every 30 (thirty) days.    diltiazem (CARDIZEM CD) 180 MG 24 hr capsule Take 1 capsule (180 mg total) by mouth daily.   ELIQUIS 5 MG TABS tablet TAKE 1 TABLET BY MOUTH TWICE DAILY   estradiol (ESTRACE) 0.1 MG/GM vaginal cream Apply a pea-sized amount to fingertip and wipe in vaginal introitus twice weekly   famotidine (PEPCID) 20 MG tablet Take 20 mg by mouth 2 (two) times daily as needed for heartburn or indigestion.   guaiFENesin (MUCINEX) 600 MG 12 hr tablet Take 600 mg by mouth 2 (two) times daily as needed.   irbesartan (AVAPRO) 300 MG tablet TAKE 1 TABLET BY MOUTH ONCE DAILY   loperamide (IMODIUM) 2 MG capsule Take 2 mg by mouth as needed for diarrhea or loose stools.   MAGNESIUM LACTATE PO Take by mouth 2 (two) times daily.   methotrexate 50 MG/2ML injection Inject 15 mg into the skin once a week.   mirabegron ER (MYRBETRIQ) 25 MG TB24 tablet Take 1 tablet (25 mg total) by mouth daily. (Patient not taking: Reported on 06/23/2022)   raNITIdine HCl (ZANTAC PO) Take by mouth.   triamcinolone (NASACORT) 55 MCG/ACT AERO nasal inhaler Place 2 sprays into the nose daily.   TUBERCULIN SYR 1CC/26GX3/8" 26G X 3/8" 1 ML MISC For injection of subcutaneous methotrexate once weekly   No facility-administered encounter medications on file as of 08/06/2022.   Contacted Peachie Barwick on 08/06/2022 for general disease state and medication adherence call.   Patient is not more than 5 days past due for refill on the following medications per chart history:  [facility] Medications: Medication  Name/mg Last Fill Days Supply Atorvastatin 10 mg  05/22/22 90 Irbesartan 300 mg  05/22/22 90   What concerns do you have about your medications? Patient did state she is no longer taking Mirabegron. She has to pay over $100 for it. She is already paying over $400 for Eliquis and can not afford to pay another big amount for Mirabegron. I asked about patient assistance for  Eliquis. She has already tried and doe not qualify. Patient stated she is fine paying the Eliquis. She does not mind not taking Mirabegron. She is just going to wear a pad which is much cheaper. That does not bother her to do so.   The patient denies side effects with their medications.   How often do you forget or accidentally miss a dose? Never  Do you use a pillbox? Yes  Are you having any problems getting your medications from your pharmacy? Yes - See above   Has the cost of your medications been a concern? Yes - see above  Since last visit with CPP, the following interventions have been made.  Start Amitriptyline 10 mg for arm pain Change: Diltiazem 180 mg vs. 240 mg Change: Methotrexate 15 mg vs. 50 mg  Stop: amitriptyline (ELAVIL) 10 MG tablet   The patient has not had an ED visit since last contact.   The patient denies problems with their health.   Patient denies concerns or questions for Charlene Brooke, PharmD at this time.   Care Gaps: Annual wellness visit in last year? Yes 12/24/21 Most Recent BP reading:  106/58 on 06/23/22  Summary of recommendations from last [place] visit (Date:04/27/2022)  Summary: CCM F/U visit -Reviewed medications; pt affirms adherence as prescribed -Pt is not taking folic acid with MTX and reports her rheumatologist is aware and monitoring more often due to this; discussed higher risk for adverse events without folic acid supplementation -LDL 108 technically above goal given hx atherosclerosis on scans, but she has no history of ASCVD events and given age/chronic pain, current control is reasonable   Recommendations/Changes made from today's visit: -No med changes   Upcoming appointments: PCP appointment on 08/06/22 B12 Injection Cardiologist 08/24/2022 CCM 10/30/2022  Charlene Brooke, CPP notified  Marijean Niemann, [place] Assistant [phone]

## 2022-08-17 DIAGNOSIS — M25551 Pain in right hip: Secondary | ICD-10-CM | POA: Diagnosis not present

## 2022-08-24 ENCOUNTER — Ambulatory Visit: Payer: Medicare Other | Admitting: Physician Assistant

## 2022-08-28 ENCOUNTER — Encounter: Payer: Self-pay | Admitting: Family Medicine

## 2022-08-28 ENCOUNTER — Ambulatory Visit (INDEPENDENT_AMBULATORY_CARE_PROVIDER_SITE_OTHER): Payer: Medicare Other | Admitting: Family Medicine

## 2022-08-28 ENCOUNTER — Encounter: Payer: Self-pay | Admitting: *Deleted

## 2022-08-28 VITALS — BP 124/68 | HR 66 | Temp 98.1°F | Ht 64.0 in | Wt 183.2 lb

## 2022-08-28 DIAGNOSIS — J020 Streptococcal pharyngitis: Secondary | ICD-10-CM | POA: Insufficient documentation

## 2022-08-28 DIAGNOSIS — J029 Acute pharyngitis, unspecified: Secondary | ICD-10-CM | POA: Insufficient documentation

## 2022-08-28 DIAGNOSIS — E041 Nontoxic single thyroid nodule: Secondary | ICD-10-CM

## 2022-08-28 LAB — POCT RAPID STREP A (OFFICE): Rapid Strep A Screen: POSITIVE — AB

## 2022-08-28 MED ORDER — NYSTATIN 100000 UNIT/ML MT SUSP: 5.0000 mL | Freq: Four times a day (QID) | OROMUCOSAL | 0 refills | Status: DC

## 2022-08-28 MED ORDER — AMOXICILLIN 500 MG PO CAPS: 1000.0000 mg | ORAL_CAPSULE | Freq: Every day | ORAL | 0 refills | Status: AC

## 2022-08-28 NOTE — Assessment & Plan Note (Signed)
Mod ST for over a week w/o fever or rash or headache  Pos strep test Is around children in family  amox px  Also nystatin solution in case she gets thrush  inst pt to check in with rheumatology to see if she should hold methotrexate while on this Update if not starting to improve in a week or if worsening  ER precautions rev Meds ordered this encounter  Medications  . amoxicillin (AMOXIL) 500 MG capsule    Sig: Take 2 capsules (1,000 mg total) by mouth daily for 10 days.    Dispense:  20 capsule    Refill:  0  . nystatin (MYCOSTATIN) 100000 UNIT/ML suspension    Sig: Take 5 mLs (500,000 Units total) by mouth 4 (four) times daily. For thrush    Dispense:  120 mL    Refill:  0

## 2022-08-28 NOTE — Patient Instructions (Signed)
When you get home, call the rheumatology office to see if you need to hold the methotrexate while on amoxicillin for sore throat   Rest  Drink fluids  Salt water gargle  Tylenol as needed   If worse let us know  If you cannot swallow, go to the ER   Update if not starting to improve in a week or if worsening    I will work on the thyroid ultrasound referral

## 2022-08-28 NOTE — Assessment & Plan Note (Addendum)
Due for imaging -did not get scheduled after Dr Loanne Drilling (he has since left the practice)  Reviewed last endocrinology note Reviewed last thyroid US Reviewed last FNA bx report   No symptoms  No change in exam Thy [place] ordered for [facility]

## 2022-08-28 NOTE — Progress Notes (Signed)
Subjective:    Patient ID: Tammy Manning, female    DOB: 11-14-1935, 87 y.o.   MRN: [id number]  HPI Pt presents for symptom of ST Also for thyroid nodule (multinodular goiter)   Wt Readings from Last 3 Encounters:  08/28/22 183 lb 3.2 oz (83.1 kg)  06/23/22 183 lb (83 kg)  06/08/22 179 lb (81.2 kg)   31.45 kg/m  ST for 7-10 days   A little better today  Thought it was from dry mouth at first  Started using vaporizer-helps a little  Tea/honey   Sometimes she has trouble swallowing    No other symptoms No ear pain  Some post nasal drip -was worse  No runny nose Not much congestion  Mucous is clear No headaches   No fever  Has baseline aches and pains   No rash  Every once in a while she feels run down   No contact with strep throat (is around many great grands) One has a St but ? If strep   Usually has allergy symptoms this time of year   Due for thyroid US for nodule  Wants to do in [place]   Results for orders placed or performed in visit on 08/28/22  Rapid Strep A  Result Value Ref Range   Rapid Strep A Screen Positive (A) Negative    Review of Systems  Constitutional:  Negative for activity change, appetite change, fatigue, fever and unexpected weight change.  HENT:  Positive for sore throat. Negative for congestion, ear pain, facial swelling, rhinorrhea, trouble swallowing and voice change.   Eyes:  Negative for pain, redness, itching and visual disturbance.  Respiratory:  Negative for cough, chest tightness, shortness of breath and wheezing.   Cardiovascular:  Negative for chest pain and palpitations.  Gastrointestinal:  Negative for abdominal pain, blood in stool, constipation, diarrhea and nausea.  Endocrine: Negative for cold intolerance, heat intolerance, polydipsia and polyuria.  Genitourinary:  Negative for difficulty urinating, dysuria, frequency and urgency.  Musculoskeletal:  Negative for arthralgias, joint swelling and myalgias.   Skin:  Negative for pallor and rash.  Neurological:  Negative for dizziness, tremors, weakness, numbness and headaches.  Hematological:  Negative for adenopathy. Does not bruise/bleed easily.  Psychiatric/Behavioral:  Negative for decreased concentration and dysphoric mood. The patient is not nervous/anxious.        Objective:   Physical Exam Constitutional:      General: She is not in acute distress.    Appearance: Normal appearance. She is obese. She is not ill-appearing.  HENT:     Head: Normocephalic and atraumatic.     Nose: Nose normal.     Mouth/Throat:     Mouth: Mucous membranes are dry.     Pharynx: Oropharynx is clear. Posterior oropharyngeal erythema present. No oropharyngeal exudate.     Comments: Diffuse patchy erythema in throat No swelling  No lesions  No exudate  Eyes:     General:        Right eye: No discharge.        Left eye: No discharge.     Conjunctiva/sclera: Conjunctivae normal.     Pupils: Pupils are equal, round, and reactive to light.  Neck:     Comments: No acute thyroid changes  Cardiovascular:     Rate and Rhythm: Normal rate.     Heart sounds: Normal heart sounds.  Musculoskeletal:     Cervical back: Normal range of motion.  Lymphadenopathy:     Cervical: No cervical adenopathy.  Skin:    General: Skin is warm and dry.     Findings: No erythema or rash.  Neurological:     Mental Status: She is alert.     Cranial Nerves: No cranial nerve deficit.  Psychiatric:        Mood and Affect: Mood normal.           Assessment & Plan:   Problem List Items Addressed This Visit       Respiratory   Strep throat    Mod ST for over a week w/o fever or rash or headache  Pos strep test Is around children in family  amox px  Also nystatin solution in case she gets thrush  inst pt to check in with rheumatology to see if she should hold methotrexate while on this Update if not starting to improve in a week or if worsening  ER precautions  rev Meds ordered this encounter  Medications   amoxicillin (AMOXIL) 500 MG capsule    Sig: Take 2 capsules (1,000 mg total) by mouth daily for 10 days.    Dispense:  20 capsule    Refill:  0   nystatin (MYCOSTATIN) 100000 UNIT/ML suspension    Sig: Take 5 mLs (500,000 Units total) by mouth 4 (four) times daily. For thrush    Dispense:  120 mL    Refill:  0         Relevant Medications   nystatin (MYCOSTATIN) 100000 UNIT/ML suspension     Endocrine   Right thyroid nodule    Due for imaging -did not get scheduled after Dr Loanne Drilling (he has since left the practice)  Reviewed last endocrinology note Reviewed last thyroid US Reviewed last FNA bx report   No symptoms  No change in exam Thy [place] ordered for [place]       Relevant Orders   US THYROID     Other   Sore throat - Primary   Relevant Orders   Rapid Strep A (Completed)

## 2022-09-01 ENCOUNTER — Ambulatory Visit
Admission: RE | Admit: 2022-09-01 | Discharge: 2022-09-01 | Disposition: A | Discharge status: Home / Self Care | Payer: Medicare Other | Source: Ambulatory Visit | Attending: Family Medicine | Admitting: Family Medicine

## 2022-09-01 DIAGNOSIS — E042 Nontoxic multinodular goiter: Secondary | ICD-10-CM | POA: Diagnosis not present

## 2022-09-01 DIAGNOSIS — E041 Nontoxic single thyroid nodule: Secondary | ICD-10-CM

## 2022-09-03 ENCOUNTER — Ambulatory Visit: Payer: Medicare Other | Admitting: Physician Assistant

## 2022-09-08 ENCOUNTER — Encounter: Payer: Self-pay | Admitting: Cardiovascular Disease

## 2022-09-08 ENCOUNTER — Ambulatory Visit: Payer: Medicare Other | Attending: Physician Assistant | Admitting: Cardiovascular Disease

## 2022-09-08 VITALS — BP 136/60 | HR 83 | Ht 64.5 in | Wt 181.2 lb

## 2022-09-08 DIAGNOSIS — I480 Paroxysmal atrial fibrillation: Secondary | ICD-10-CM | POA: Diagnosis not present

## 2022-09-08 DIAGNOSIS — E7800 Pure hypercholesterolemia, unspecified: Secondary | ICD-10-CM

## 2022-09-08 DIAGNOSIS — I10 Essential (primary) hypertension: Secondary | ICD-10-CM

## 2022-09-08 NOTE — Progress Notes (Signed)
Cardiology Office Note   Date:  09/08/2022   ID:  [name], DOB 05/23/1935, MRN [id number]  PCP:  Abner Greenspan, MD  Cardiologist:  Kathlyn Sacramento, MD   Chief Complaint  Patient presents with   Follow-up    2 month follow up, no new Cardiac concerns        History of Present Illness: Allyn Bartelson is a 87 y.o. female who presents for a follow-up visit regarding paroxysmal atrial fibrillation and aortic insufficiency.   She has intolerance to multiple antihypertensive medications.  Cardiac catheterization in April 2018 showed normal coronary arteries and normal LV systolic function.  Abdominal aortogram showed no evidence of renal artery stenosis.    Previous echocardiogram in June 2019 showed an EF of 55 - 60%, mild LVH and mild aortic insufficiency.    She was hospitalized in June of 2022 for atrial fibrillation.  She took an extra dose of chlorthalidone for leg edema and presented with increased fatigue and palpitations.  She was noted to be in atrial fibrillation with RVR.  She was started on diltiazem drip and converted to sinus rhythm.  She was noted to be mildly hypokalemic. The patient was switched from chlorthalidone to diltiazem.  CHA2DS2-VASc score was 4 and thus she was started on anticoagulation with Eliquis 5 mg twice daily.  Echocardiogram was done which showed an EF of 60 to 65%, mildly dilated left atrium with mildly calcified aortic valve.  She was most recently seen in August and reported increased palpitations.  Increasing diltiazem led to more dizziness.  Thus, an outpatient monitor was performed which showed sinus rhythm with 13 episodes of SVT the longest lasting 7 beats.  No evidence of atrial fibrillation.  He has been doing very well with no chest pain, shortness of breath or palpitations.  Past Medical History:  Diagnosis Date   A-fib ([place])    Anginal pain ([place])    Aortic insufficiency    a. 02/2020 Echo: EF 60-65%, no rwma, Gr1 DD, nl RV  fxn, mildly dil LA, triv AI, Asc Ao 28m; b. 04/2021 Echo: EF 60-65%, no rwma, GrI DD, nl RV fxn, mildly dil LA, AI not visualized. Mild-mod AoV sclerosis w/o stenosis.   Arthritis    Knees   B12 deficiency    Mild   Bucket-handle tear of lateral meniscus of left knee as current injury 12/30/2012   Cancer ([place]    Skin, basal cell on nose and eyelid   DJD (degenerative joint disease)    Low back   Dyspepsia    GERD (gastroesophageal reflux disease)    Heart murmur    Heart palpitations    History of cardiac cath    a. 02/2017 Cath: Nl cors. Nl LV fxn. Abd Aogram w/o RAS.   HOH (hard of hearing)    Hyperlipidemia    Hypertension    IBS (irritable bowel syndrome)    Lactose intolerance    Mixed incontinence    PONV (postoperative nausea and vomiting)    Vulvar irritation    from urine pad, uses Triamcinolone oint.    Past Surgical History:  Procedure Laterality Date   ABDOMINAL AORTOGRAM N/A 03/15/2017   Procedure: Abdominal Aortogram;  Surgeon: MWellington Hampshire MD;  [facility];  Service: Cardiovascular;  Laterality: N/A;   ABDOMINAL HYSTERECTOMY  1982   Large fibroids and bleeding   APPENDECTOMY     BILATERAL SALPINGOOPHORECTOMY  1986   BONE GRAFT HIP ILIAC CREST  To Left arm   BREAST REDUCTION SURGERY     BREAST SURGERY     CARDIAC CATHETERIZATION     CATARACT EXTRACTION W/PHACO Left 07/07/2016   Procedure: CATARACT EXTRACTION PHACO AND INTRAOCULAR LENS PLACEMENT (IOC);  Surgeon: Birder Robson, MD;  Location: [facility];  Service: Ophthalmology;  Laterality: Left;  [place] 01:10 AP% 18.3 CDE 12.91 Fluid pak lot # [id number]   CATARACT EXTRACTION W/PHACO Right 07/28/2016   Procedure: CATARACT EXTRACTION PHACO AND INTRAOCULAR LENS PLACEMENT (IOC);  Surgeon: Birder Robson, MD;  Location: [facility];  Service: Ophthalmology;  Laterality: Right;  [place] 01:26 AP% 18.5 CDE 16.12 Fluid pack lot # [id number]   COLONOSCOPY  07/2016   Dr. Thana Farr   DILATION AND  CURETTAGE OF UTERUS     miscarragesx2   ESOPHAGOGASTRODUODENOSCOPY     Polyps   HEMORRHOID SURGERY  2006   KNEE ARTHROSCOPY WITH LATERAL MENISECTOMY Left 12/30/2012   Procedure: KNEE ARTHROSCOPY WITH LATERAL MENISECTOMY;  Surgeon: Johnny Bridge, MD;  Location: [place];  Service: Orthopedics;  Laterality: Left;  LEFT KNEE SCOPE LATERAL MENISCECTOMY   LEFT HEART CATH AND CORONARY ANGIOGRAPHY N/A 03/15/2017   Procedure: Left Heart Cath and Coronary Angiography;  Surgeon: Wellington Hampshire, MD;  Location: [facility];  Service: Cardiovascular;  Laterality: N/A;   MOUTH SURGERY     skin cancer eyelid  2012   TONSILLECTOMY       Current Outpatient Medications  Medication Sig Dispense Refill   acetaminophen (TYLENOL) 500 MG tablet Take 500 mg by mouth every 6 (six) hours as needed.     amitriptyline (ELAVIL) 10 MG tablet Take 1 tablet (10 mg total) by mouth at bedtime. 90 tablet 3   atorvastatin (LIPITOR) 10 MG tablet Take 1 tablet (10 mg total) by mouth daily. 90 tablet 3   Calcium Carbonate-Vitamin D (CALCIUM 600+D PO) Take 1 tablet by mouth daily.      clobetasol ointment (TEMOVATE) 7.86 % Apply 1 application topically 2 (two) times daily as needed.     clorazepate (TRANXENE) 7.5 MG tablet Take 1 tablet (7.5 mg total) by mouth daily as needed. 30 tablet 0   cyanocobalamin (,VITAMIN B-12,) 1000 MCG/ML injection Inject 1,000 mcg into the muscle every 30 (thirty) days.      diltiazem (CARDIZEM CD) 180 MG 24 hr capsule Take 1 capsule (180 mg total) by mouth daily. 90 capsule 3   ELIQUIS 5 MG TABS tablet TAKE 1 TABLET BY MOUTH TWICE DAILY 180 tablet 1   estradiol (ESTRACE) 0.1 MG/GM vaginal cream Apply a pea-sized amount to fingertip and wipe in vaginal introitus twice weekly 42.5 g 1   famotidine (PEPCID) 20 MG tablet Take 20 mg by mouth 2 (two) times daily as needed for heartburn or indigestion.     guaiFENesin (MUCINEX) 600 MG 12 hr tablet Take 600 mg by mouth 2 (two)  times daily as needed.     irbesartan (AVAPRO) 300 MG tablet TAKE 1 TABLET BY MOUTH ONCE DAILY 90 tablet 2   loperamide (IMODIUM) 2 MG capsule Take 2 mg by mouth as needed for diarrhea or loose stools.     MAGNESIUM LACTATE PO Take by mouth 2 (two) times daily.     methotrexate 50 MG/2ML injection Inject 15 mg into the skin once a week.     mirabegron ER (MYRBETRIQ) 25 MG TB24 tablet Take 1 tablet (25 mg total) by mouth daily. 30 tablet 11   raNITIdine HCl (ZANTAC PO) Take  by mouth.     TUBERCULIN SYR 1CC/26GX3/8" 26G X 3/8" 1 ML MISC For injection of subcutaneous methotrexate once weekly 25 each 0   nystatin (MYCOSTATIN) 100000 UNIT/ML suspension Take 5 mLs (500,000 Units total) by mouth 4 (four) times daily. For thrush (Patient not taking: Reported on 09/08/2022) 120 mL 0   triamcinolone (NASACORT) 55 MCG/ACT AERO nasal inhaler Place 2 sprays into the nose daily. (Patient not taking: Reported on 09/08/2022) 1 each 12   No current facility-administered medications for this visit.    Allergies:   Antihistamines, chlorpheniramine-type; Atenolol; Cefuroxime axetil; Ciprofloxacin; Co q 10 [coenzyme q10]; Crestor [rosuvastatin calcium]; Dextromethorphan-guaifenesin; Epinephrine; Ramipril; Sulfamethoxazole-trimethoprim; and Vitamin d analogs    Social History:  The patient  reports that she quit smoking about 58 years ago. Her smoking use included cigarettes. She has a 12.00 pack-year smoking history. She has never used smokeless tobacco. She reports that she does not drink alcohol and does not use drugs.   Family History:  The patient's family history includes Alzheimer's disease in her sister; Breast cancer in her daughter; Cancer in her mother and paternal aunt; Diabetes in her brother, brother, sister, and sister; Esophageal cancer in her brother; Heart disease in her father; Hypothyroidism in her sister; Leukemia in her brother.    ROS:  Please see the history of present illness.   Otherwise,  review of systems are positive for none.   All other systems are reviewed and negative.    PHYSICAL EXAM: VS:  BP 136/60 (BP Location: Left Arm, Patient Position: Sitting, Cuff Size: Normal)   Pulse 83   Ht 5' 4.5" (1.638 m)   Wt 181 lb 3.2 oz (82.2 kg)   SpO2 97%   BMI 30.62 kg/m  , BMI Body mass index is 30.62 kg/m. GEN: Well nourished, well developed, in no acute distress  HEENT: normal  Neck: no JVD, carotid bruits, or masses Cardiac: RRR; no  rubs, or gallops,no edema, 2/6 systolic murmur in the aortic area. Respiratory:  clear to auscultation bilaterally, normal work of breathing GI: soft, nontender, nondistended, + BS MS: no deformity or atrophy  Skin: warm and dry, no rash Neuro:  Strength and sensation are intact Psych: euthymic mood, full affect   EKG:  EKG is ordered today. The ekg ordered today demonstrates normal sinus rhythm with no significant ST or T wave changes.     Recent Labs: 04/09/2022: ALT 11 06/23/2022: BUN 21; Creatinine, Ser 0.79; Hemoglobin 11.2; Magnesium 2.1; Platelets 302; Potassium 3.9; Sodium 139; TSH 0.779    Lipid Panel    Component Value Date/Time   CHOL 199 01/16/2022 0835   CHOL 196 02/02/2020 1348   TRIG 157.0 (H) 01/16/2022 0835   HDL 59.40 01/16/2022 0835   HDL 80 02/02/2020 1348   CHOLHDL 3 01/16/2022 0835   VLDL 31.4 01/16/2022 0835   LDLCALC 108 (H) 01/16/2022 0835   LDLCALC 93 02/02/2020 1348   LDLDIRECT 106 (H) 02/02/2020 1348   LDLDIRECT 105.9 02/08/2013 1050      Wt Readings from Last 3 Encounters:  09/08/22 181 lb 3.2 oz (82.2 kg)  08/28/22 183 lb 3.2 oz (83.1 kg)  06/23/22 183 lb (83 kg)           No data to display            ASSESSMENT AND PLAN:   1.  Paroxysmal atrial fibrillation: She is doing well overall with minimal palpitations at the present time.  Continue current dose of diltiazem.  Continue anticoagulation with Eliquis 5 mg twice daily.    2. Essential hypertension:   Blood pressure is  well controlled on diltiazem and irbesartan.  3. Hyperlipidemia: Currently she is on atorvastatin 10 mg daily.  Most recent lipid profile showed an LDL of 108.     Disposition:   FU in 6 months   Signed,  Kathlyn Sacramento, MD 09/08/22 [place], [place]

## 2022-09-08 NOTE — Patient Instructions (Signed)
Medication Instructions:  - Your physician recommends that you continue on your current medications as directed. Please refer to the Current Medication list given to you today.  *If you need a refill on your cardiac medications before your next appointment, please call your pharmacy*   Lab Work: - none ordered  If you have labs (blood work) drawn today and your tests are completely normal, you will receive your results only by: [place] (if you have MyChart) OR A paper copy in the mail If you have any lab test that is abnormal or we need to change your treatment, we will call you to review the results.   Testing/Procedures: - none ordered   Follow-Up: At [facility], you and your health needs are our priority.  As part of our continuing mission to provide you with exceptional heart care, we have created designated Provider Care Teams.  These Care Teams include your primary Cardiologist (physician) and Advanced Practice Providers (APPs -  Physician Assistants and Nurse Practitioners) who all work together to provide you with the care you need, when you need it.  We recommend signing up for the patient portal called "MyChart".  Sign up information is provided on this After Visit Summary.  MyChart is used to connect with patients for Virtual Visits (Telemedicine).  Patients are able to view lab/test results, encounter notes, upcoming appointments, etc.  Non-urgent messages can be sent to your provider as well.   To learn more about what you can do with MyChart, go to [email or web address].    Your next appointment:   6 month(s)  The format for your next appointment:   In Person  Provider:   You may see Kathlyn Sacramento, MD or one of the following Advanced Practice Providers on your designated Care Team:   Murray Hodgkins, NP [name], PA-C Cadence Kathlen Mody, PA-C Gerrie Nordmann, NP    Other Instructions N/a  Important Information About Sugar

## 2022-09-16 ENCOUNTER — Ambulatory Visit: Payer: Medicare Other

## 2022-09-22 ENCOUNTER — Encounter: Payer: Self-pay | Admitting: Physician Assistant

## 2022-09-22 ENCOUNTER — Ambulatory Visit (INDEPENDENT_AMBULATORY_CARE_PROVIDER_SITE_OTHER): Payer: Medicare Other | Admitting: Physician Assistant

## 2022-09-22 VITALS — BP 126/77 | HR 80 | Ht 64.0 in | Wt 178.0 lb

## 2022-09-22 DIAGNOSIS — R3915 Urgency of urination: Secondary | ICD-10-CM

## 2022-09-22 DIAGNOSIS — R300 Dysuria: Secondary | ICD-10-CM

## 2022-09-22 DIAGNOSIS — N390 Urinary tract infection, site not specified: Secondary | ICD-10-CM | POA: Diagnosis not present

## 2022-09-22 DIAGNOSIS — R82998 Other abnormal findings in urine: Secondary | ICD-10-CM | POA: Diagnosis not present

## 2022-09-22 LAB — URINALYSIS, COMPLETE
Bilirubin, UA: NEGATIVE
Glucose, UA: NEGATIVE
Ketones, UA: NEGATIVE
Nitrite, UA: NEGATIVE
Protein,UA: NEGATIVE
Specific Gravity, UA: <1.005 — ABNORMAL LOW (ref 1.005–1.030)
Urobilinogen, Ur: 0.2 mg/dL (ref 0.2–1.0)
pH, UA: 5 (ref 5.0–7.5)

## 2022-09-22 LAB — MICROSCOPIC EXAMINATION: WBC, UA: >30 /hpf — AB (ref 0–5)

## 2022-09-22 MED ORDER — GEMTESA 75 MG PO TABS: 75.0000 mg | ORAL_TABLET | Freq: Every day | ORAL | 0 refills | Status: DC

## 2022-09-22 MED ORDER — MIRABEGRON ER 50 MG PO TB24: 50.0000 mg | ORAL_TABLET | Freq: Every day | ORAL | 0 refills | Status: DC

## 2022-09-22 MED ORDER — NITROFURANTOIN MONOHYD MACRO 100 MG PO CAPS: 100.0000 mg | ORAL_CAPSULE | Freq: Two times a day (BID) | ORAL | 0 refills | Status: AC

## 2022-09-22 NOTE — Progress Notes (Unsigned)
09/22/2022 3:07 PM   Tammy Manning 07-May-1935 [id number]  CC: Chief Complaint  Patient presents with   Dysuria   HPI: Tammy Manning is a 87 y.o. female with PMH recurrent UTI versus asymptomatic bacteriuria on d-mannose and vaginal estrogen and OAB wet with mixed urge and stress incontinence on Myrbetriq 25 mg daily who presents today for evaluation of possible UTI.   Today she reports only history of progressive dysuria responsive to Azo.  She denies fever, chills, nausea, vomiting, flank pain, and gross hematuria.  She reports GI upset with multiple antibiotics.  Additionally, she reports that she is not yet at her treatment goal with regard to her OAB wet.  She thinks that the Myrbetriq 25 mg has helped, but there is room for improvement.  In-office UA today positive for 1+ blood and 1+ leukocytes; urine microscopy with >30 WBCs/HPF and many bacteria.  PMH: Past Medical History:  Diagnosis Date   A-fib ([place])    Anginal pain ([place])    Aortic insufficiency    a. 02/2020 Echo: EF 60-65%, no rwma, Gr1 DD, nl RV fxn, mildly dil LA, triv AI, Asc Ao 89m; b. 04/2021 Echo: EF 60-65%, no rwma, GrI DD, nl RV fxn, mildly dil LA, AI not visualized. Mild-mod AoV sclerosis w/o stenosis.   Arthritis    Knees   B12 deficiency    Mild   Bucket-handle tear of lateral meniscus of left knee as current injury 12/30/2012   [place]    Skin, basal cell on nose and eyelid   DJD (degenerative joint disease)    Low back   Dyspepsia    GERD (gastroesophageal reflux disease)    Heart murmur    Heart palpitations    History of cardiac cath    a. 02/2017 Cath: Nl cors. Nl LV fxn. Abd Aogram w/o RAS.   HOH (hard of hearing)    Hyperlipidemia    Hypertension    IBS (irritable bowel syndrome)    Lactose intolerance    Mixed incontinence    PONV (postoperative nausea and vomiting)    Vulvar irritation    from urine pad, uses Triamcinolone oint.    Surgical History: Past Surgical  History:  Procedure Laterality Date   ABDOMINAL AORTOGRAM N/A 03/15/2017   Procedure: Abdominal Aortogram;  Surgeon: MWellington Hampshire MD;  [facility];  Service: Cardiovascular;  Laterality: N/A;   ABDOMINAL HYSTERECTOMY  1982   Large fibroids and bleeding   APPENDECTOMY     BILATERAL SALPINGOOPHORECTOMY  1986   BONE GRAFT HIP ILIAC CREST     To Left arm   BREAST REDUCTION SURGERY     BREAST SURGERY     CARDIAC CATHETERIZATION     CATARACT EXTRACTION W/PHACO Left 07/07/2016   Procedure: CATARACT EXTRACTION PHACO AND INTRAOCULAR LENS PLACEMENT (IOC);  Surgeon: WBirder Robson MD;  Location: [facility];  Service: Ophthalmology;  Laterality: Left;  [place]:10 AP% 18.3 CDE 12.91 Fluid pak lot # [id number]   CATARACT EXTRACTION W/PHACO Right 07/28/2016   Procedure: CATARACT EXTRACTION PHACO AND INTRAOCULAR LENS PLACEMENT (IOC);  Surgeon: WBirder Robson MD;  Location: [facility];  Service: Ophthalmology;  Laterality: Right;  [place]01:26 AP% 18.5 CDE 16.12 Fluid pack lot # [id number]   COLONOSCOPY  07/2016   Dr. MThana Farr  DILATION AND CURETTAGE OF UTERUS     miscarragesx2   ESOPHAGOGASTRODUODENOSCOPY     Polyps   HEMORRHOID SURGERY  2006   KNEE ARTHROSCOPY WITH LATERAL MENISECTOMY  Left 12/30/2012   Procedure: KNEE ARTHROSCOPY WITH LATERAL MENISECTOMY;  Surgeon: Johnny Bridge, MD;  Location: [facility];  Service: Orthopedics;  Laterality: Left;  LEFT KNEE SCOPE LATERAL MENISCECTOMY   LEFT HEART CATH AND CORONARY ANGIOGRAPHY N/A 03/15/2017   Procedure: Left Heart Cath and Coronary Angiography;  Surgeon: Wellington Hampshire, MD;  Location: [facility];  Service: Cardiovascular;  Laterality: N/A;   MOUTH SURGERY     skin cancer eyelid  2012   TONSILLECTOMY      Home Medications:  Allergies as of 09/22/2022       Reactions   Antihistamines, Chlorpheniramine-type Other (See Comments)   Reaction:  Makes pt hyper    Atenolol Hypertension   Cefuroxime Axetil  Other (See Comments)   Upset stomach   Ciprofloxacin Other (See Comments)   Upset stomach   Co Q 10 [coenzyme Q10] Other (See Comments)   Reaction:  GI upset    Crestor [rosuvastatin Calcium] Other (See Comments)   Reaction:  Myalgias   Dextromethorphan-guaifenesin Other (See Comments)   Reaction:  Made pt jittery    Epinephrine Hives   Ramipril Other (See Comments)   Upset stomach   Sulfamethoxazole-trimethoprim Other (See Comments)   Upset stomach   Vitamin D Analogs Other (See Comments)   Reaction:  GI upset         Medication List        Accurate as of September 22, 2022  3:07 PM. If you have any questions, ask your nurse or doctor.          acetaminophen 500 MG tablet Commonly known as: TYLENOL Take 500 mg by mouth every 6 (six) hours as needed.   amitriptyline 10 MG tablet Commonly known as: ELAVIL Take 1 tablet (10 mg total) by mouth at bedtime.   atorvastatin 10 MG tablet Commonly known as: LIPITOR Take 1 tablet (10 mg total) by mouth daily.   CALCIUM 600+D PO Take 1 tablet by mouth daily.   clobetasol ointment 0.05 % Commonly known as: TEMOVATE Apply 1 application topically 2 (two) times daily as needed.   clorazepate 7.5 MG tablet Commonly known as: TRANXENE Take 1 tablet (7.5 mg total) by mouth daily as needed.   cyanocobalamin 1000 MCG/ML injection Commonly known as: VITAMIN B12 Inject 1,000 mcg into the muscle every 30 (thirty) days.   diltiazem 180 MG 24 hr capsule Commonly known as: CARDIZEM CD Take 1 capsule (180 mg total) by mouth daily.   Eliquis 5 MG Tabs tablet Generic drug: apixaban TAKE 1 TABLET BY MOUTH TWICE DAILY   estradiol 0.1 MG/GM vaginal cream Commonly known as: ESTRACE Apply a pea-sized amount to fingertip and wipe in vaginal introitus twice weekly   famotidine 20 MG tablet Commonly known as: PEPCID Take 20 mg by mouth 2 (two) times daily as needed for heartburn or indigestion.   guaiFENesin 600 MG 12 hr  tablet Commonly known as: MUCINEX Take 600 mg by mouth 2 (two) times daily as needed.   irbesartan 300 MG tablet Commonly known as: AVAPRO TAKE 1 TABLET BY MOUTH ONCE DAILY   loperamide 2 MG capsule Commonly known as: IMODIUM Take 2 mg by mouth as needed for diarrhea or loose stools.   MAGNESIUM LACTATE PO Take by mouth 2 (two) times daily.   methotrexate 50 MG/2ML injection Inject 15 mg into the skin once a week.   mirabegron ER 25 MG Tb24 tablet Commonly known as: MYRBETRIQ Take 1 tablet (25 mg  total) by mouth daily.   nystatin 100000 UNIT/ML suspension Commonly known as: MYCOSTATIN Take 5 mLs (500,000 Units total) by mouth 4 (four) times daily. For thrush   triamcinolone 55 MCG/ACT Aero nasal inhaler Commonly known as: NASACORT Place 2 sprays into the nose daily.   TUBERCULIN SYR 1CC/26GX3/8" 26G X 3/8" 1 ML Misc For injection of subcutaneous methotrexate once weekly   ZANTAC PO Take by mouth.        Allergies:  Allergies  Allergen Reactions   Antihistamines, Chlorpheniramine-Type Other (See Comments)    Reaction:  Makes pt hyper    Atenolol Hypertension   Cefuroxime Axetil Other (See Comments)    Upset stomach   Ciprofloxacin Other (See Comments)    Upset stomach   Co Q 10 [Coenzyme Q10] Other (See Comments)    Reaction:  GI upset    Crestor [Rosuvastatin Calcium] Other (See Comments)    Reaction:  Myalgias    Dextromethorphan-Guaifenesin Other (See Comments)    Reaction:  Made pt jittery    Epinephrine Hives   Ramipril Other (See Comments)    Upset stomach   Sulfamethoxazole-Trimethoprim Other (See Comments)    Upset stomach   Vitamin D Analogs Other (See Comments)    Reaction:  GI upset     Family History: Family History  Problem Relation Age of Onset   Cancer Mother        Colon   Heart disease Father        CAD   Diabetes Sister    Hypothyroidism Sister    Diabetes Brother    Leukemia Brother    Esophageal cancer Brother    Cancer  Paternal Aunt        Breast   Diabetes Sister    Alzheimer's disease Sister    Diabetes Brother    Breast cancer Daughter     Social History:   reports that she quit smoking about 58 years ago. Her smoking use included cigarettes. She has a 12.00 pack-year smoking history. She has never used smokeless tobacco. She reports that she does not drink alcohol and does not use drugs.  Physical Exam: BP 126/77   Pulse 80   Ht 5' 4"  (1.626 m)   Wt 178 lb (80.7 kg)   BMI 30.55 kg/m   Constitutional:  Alert and oriented, no acute distress, nontoxic appearing HEENT: [place], AT Cardiovascular: No clubbing, cyanosis, or edema Respiratory: Normal respiratory effort, no increased work of breathing Skin: No rashes, bruises or suspicious lesions Neurologic: Grossly intact, no focal deficits, moving all 4 extremities Psychiatric: Normal mood and affect  Laboratory Data: Results for orders placed or performed in visit on 09/22/22  Microscopic Examination   Urine  Result Value Ref Range   WBC, UA >30 (A) 0 - 5 /hpf   RBC, Urine 0-2 0 - 2 /hpf   Epithelial Cells (non renal) 0-10 0 - 10 /hpf   Bacteria, UA Many (A) None seen/Few  Urinalysis, Complete  Result Value Ref Range   Specific Gravity, UA <1.005 (L) 1.005 - 1.030   pH, UA 5.0 5.0 - 7.5   Color, UA Yellow Yellow   Appearance Ur Cloudy (A) Clear   Leukocytes,UA 1+ (A) Negative   Protein,UA Negative Negative/Trace   Glucose, UA Negative Negative   Ketones, UA Negative Negative   RBC, UA 1+ (A) Negative   Bilirubin, UA Negative Negative   Urobilinogen, Ur 0.2 0.2 - 1.0 mg/dL   Nitrite, UA Negative Negative   Microscopic  Examination See below:    Assessment & Plan:   1. Recurrent UTI UA appears grossly infected today, will start empiric Macrobid and send for culture for further evaluation. - Urinalysis, Complete - CULTURE, URINE COMPREHENSIVE - nitrofurantoin, macrocrystal-monohydrate, (MACROBID) 100 MG capsule; Take 1 capsule (100 mg  total) by mouth 2 (two) times daily for 5 days.  Dispense: 10 capsule; Refill: 0  2. Urinary urgency We discussed increasing her Myrbetriq dose to 50 mg, unfortunately I am out of the samples today.  I am giving her 6 weeks of Gemtesa samples instead and will have her call clinic to let us know which medication she prefers.  Alternatively, we discussed having her come back to clinic as needed if she is still not at her treatment goal, at which point we can consider third line therapies. - Vibegron (GEMTESA) 75 MG TABS; Take 75 mg by mouth daily.  Dispense: 28 tablet; Refill: 0   Return if symptoms worsen or fail to improve.  [name], PA-C  [facility] [address] [place], [place] [address] [phone]

## 2022-09-27 LAB — CULTURE, URINE COMPREHENSIVE

## 2022-09-29 ENCOUNTER — Telehealth: Payer: Self-pay | Admitting: Physician Assistant

## 2022-09-29 NOTE — Telephone Encounter (Signed)
5 days of antibiotics should be enough to treat her current infection. E coli is the most common bacteria that causes UTI so it is not surprising that she grows it often. Additionally, we have wondered if she tends to grow this bacteria regardless of active infection.   If her symptoms do not resolve on culture-appropriate antibiotics, our next step will be cystoscopy with Dr. Bernardo Heater.

## 2022-09-29 NOTE — Telephone Encounter (Signed)
Spoke with patient and she states she been having Ecoli for a year. I tried going over if she was using the medication correctly, pt states she puts the estradiol in her vagina. Pt states she also know how to wipe ( back to front then front to back). Pt states she will call back in a few days because the symptoms will be back, cramping, burning " funny feeling". Please advise

## 2022-09-29 NOTE — Telephone Encounter (Signed)
Pt LMOM wanting to know if 5 days on her antibiotic would be enough since there is e-coli infection.  She said the last time she had this, she was on antibiotics for 3 months.

## 2022-10-01 ENCOUNTER — Ambulatory Visit (INDEPENDENT_AMBULATORY_CARE_PROVIDER_SITE_OTHER): Payer: Medicare Other

## 2022-10-01 DIAGNOSIS — E538 Deficiency of other specified B group vitamins: Secondary | ICD-10-CM | POA: Diagnosis not present

## 2022-10-01 MED ORDER — CYANOCOBALAMIN 1000 MCG/ML IJ SOLN
1000.0000 ug | Freq: Once | INTRAMUSCULAR | Status: AC
  Administered 2022-10-01: 1000 ug via INTRAMUSCULAR

## 2022-10-01 NOTE — Progress Notes (Signed)
Per orders of Dr. Loura Pardon, injection of B-12 given by Pat Kocher in left deltoid. Patient tolerated injection well. Patient will make appointment for 1 month.

## 2022-10-01 NOTE — Telephone Encounter (Signed)
Spoke with patient and advised results   

## 2022-10-14 DIAGNOSIS — H35363 Drusen (degenerative) of macula, bilateral: Secondary | ICD-10-CM | POA: Diagnosis not present

## 2022-10-21 DIAGNOSIS — M1711 Unilateral primary osteoarthritis, right knee: Secondary | ICD-10-CM | POA: Diagnosis not present

## 2022-10-28 DIAGNOSIS — M1711 Unilateral primary osteoarthritis, right knee: Secondary | ICD-10-CM | POA: Diagnosis not present

## 2022-10-30 ENCOUNTER — Telehealth: Payer: Medicare Other

## 2022-11-04 DIAGNOSIS — M1711 Unilateral primary osteoarthritis, right knee: Secondary | ICD-10-CM | POA: Diagnosis not present

## 2022-11-09 ENCOUNTER — Telehealth: Payer: Medicare Other

## 2022-11-12 ENCOUNTER — Ambulatory Visit (INDEPENDENT_AMBULATORY_CARE_PROVIDER_SITE_OTHER): Payer: Medicare Other

## 2022-11-12 DIAGNOSIS — E538 Deficiency of other specified B group vitamins: Secondary | ICD-10-CM | POA: Diagnosis not present

## 2022-11-12 MED ORDER — CYANOCOBALAMIN 1000 MCG/ML IJ SOLN
1000.0000 ug | Freq: Once | INTRAMUSCULAR | Status: AC
  Administered 2022-11-12: 1000 ug via INTRAMUSCULAR

## 2022-11-12 NOTE — Progress Notes (Signed)
Per orders of Dr. Glori Bickers, injection of vit W06 given by Brenton Grills. Patient tolerated injection well.

## 2022-11-24 ENCOUNTER — Telehealth: Payer: Self-pay | Admitting: Cardiovascular Disease

## 2022-11-24 ENCOUNTER — Other Ambulatory Visit: Payer: Self-pay | Admitting: Internal Medicine

## 2022-11-24 DIAGNOSIS — M059 Rheumatoid arthritis with rheumatoid factor, unspecified: Secondary | ICD-10-CM

## 2022-11-24 DIAGNOSIS — I4891 Unspecified atrial fibrillation: Secondary | ICD-10-CM

## 2022-11-24 MED ORDER — APIXABAN 5 MG PO TABS: 5.0000 mg | ORAL_TABLET | Freq: Two times a day (BID) | ORAL | 1 refills | Status: DC

## 2022-11-24 NOTE — Telephone Encounter (Signed)
Refill request

## 2022-11-24 NOTE — Telephone Encounter (Signed)
*  STAT* If patient is at the pharmacy, call can be transferred to refill team.   1. Which medications need to be refilled? (please list name of each medication and dose if known)  ELIQUIS 5 MG TABS tablet  2. Which pharmacy/location (including street and city if local pharmacy) is medication to be sent to? [id number] - [place], [place] [address]  3. Do they need a 30 day or 90 day supply?  90 day supply

## 2022-11-29 DIAGNOSIS — R3915 Urgency of urination: Secondary | ICD-10-CM

## 2022-11-30 MED ORDER — GEMTESA 75 MG PO TABS: 75.0000 mg | ORAL_TABLET | Freq: Every day | ORAL | 11 refills | Status: DC

## 2022-12-15 ENCOUNTER — Other Ambulatory Visit: Payer: Self-pay | Admitting: Family Medicine

## 2022-12-15 MED ORDER — CLORAZEPATE DIPOTASSIUM 7.5 MG PO TABS: 7.5000 mg | ORAL_TABLET | Freq: Every day | ORAL | 0 refills | Status: DC | PRN

## 2022-12-15 NOTE — Telephone Encounter (Signed)
Name of Medication: Clorazepate Name of Pharmacy: [facility] or Written Date and Quantity: 07/08/22 #30 tabs/ 0 refill Last Office Visit and Type: ST on 08/28/22 (CPE 01/23/22) Next Office Visit and Type: none scheduled

## 2022-12-23 DIAGNOSIS — M1712 Unilateral primary osteoarthritis, left knee: Secondary | ICD-10-CM | POA: Diagnosis not present

## 2022-12-25 ENCOUNTER — Ambulatory Visit (INDEPENDENT_AMBULATORY_CARE_PROVIDER_SITE_OTHER): Payer: Medicare Other

## 2022-12-25 VITALS — Ht 64.0 in | Wt 178.0 lb

## 2022-12-25 DIAGNOSIS — Z0000 Encounter for general adult medical examination without abnormal findings: Secondary | ICD-10-CM

## 2022-12-25 NOTE — Progress Notes (Signed)
Subjective:   Tammy Manning is a 88 y.o. female who presents for Medicare Annual (Subsequent) preventive examination.  Review of Systems    No ROS.  Medicare Wellness Virtual Visit.  Visual/audio telehealth visit, UTA vital signs.   See social history for additional risk factors.   Cardiac Risk Factors include: advanced age (>73mn, >>49women)     Objective:    Today's Vitals   12/25/22 1235  Weight: 178 lb (80.7 kg)  Height: '5\' 4"'$  (1.626 m)   Body mass index is 30.55 kg/m.     12/25/2022   12:40 PM 12/24/2021    1:23 PM 06/08/2021    1:11 PM 04/28/2021    6:28 PM 03/20/2021    3:26 PM 06/02/2020   12:06 PM 04/26/2020    9:04 AM  Advanced Directives  Does Patient Have a Medical Advance Directive? Yes Yes No No Yes No Yes  Type of [place]of [place] will Living will   Living will  [place] will  Does patient want to make changes to medical advance directive? No - Patient declined Yes (MAU/Ambulatory/Procedural Areas - Information given)       Copy of HHomelandin Chart? Yes - validated most recent copy scanned in chart (See row information)      No - copy requested  Would patient like information on creating a medical advance directive?      No - Patient declined     Current Medications (verified) Outpatient Encounter Medications as of 12/25/2022  Medication Sig   acetaminophen (TYLENOL) 500 MG tablet Take 500 mg by mouth every 6 (six) hours as needed. (Patient not taking: Reported on 12/25/2022)   amitriptyline (ELAVIL) 10 MG tablet Take 1 tablet (10 mg total) by mouth at bedtime.   apixaban (ELIQUIS) 5 MG TABS tablet Take 1 tablet (5 mg total) by mouth 2 (two) times daily.   atorvastatin (LIPITOR) 10 MG tablet Take 1 tablet (10 mg total) by mouth daily.   Calcium Carbonate-Vitamin D (CALCIUM 600+D PO) Take 1 tablet by mouth daily.    clobetasol ointment (TEMOVATE) 04.26% Apply 1 application  topically 2 (two) times daily as needed.   clorazepate (TRANXENE) 7.5 MG tablet Take 1 tablet (7.5 mg total) by mouth daily as needed.   cyanocobalamin (,VITAMIN B-12,) 1000 MCG/ML injection Inject 1,000 mcg into the muscle every 30 (thirty) days.    diltiazem (CARDIZEM CD) 180 MG 24 hr capsule Take 1 capsule (180 mg total) by mouth daily.   estradiol (ESTRACE) 0.1 MG/GM vaginal cream Apply a pea-sized amount to fingertip and wipe in vaginal introitus twice weekly   famotidine (PEPCID) 20 MG tablet Take 20 mg by mouth 2 (two) times daily as needed for heartburn or indigestion.   guaiFENesin (MUCINEX) 600 MG 12 hr tablet Take 600 mg by mouth 2 (two) times daily as needed.   irbesartan (AVAPRO) 300 MG tablet TAKE 1 TABLET BY MOUTH ONCE DAILY   loperamide (IMODIUM) 2 MG capsule Take 2 mg by mouth as needed for diarrhea or loose stools.   MAGNESIUM LACTATE PO Take by mouth 2 (two) times daily.   methotrexate 50 MG/2ML injection Inject 15 mg into the skin once a week.   nystatin (MYCOSTATIN) 100000 UNIT/ML suspension Take 5 mLs (500,000 Units total) by mouth 4 (four) times daily. For thrush   raNITIdine HCl (ZANTAC PO) Take by mouth.   triamcinolone (NASACORT) 55 MCG/ACT AERO nasal inhaler Place 2  sprays into the nose daily.   TUBERCULIN SYR 1CC/26GX3/8" 26G X 3/8" 1 ML MISC For injection of subcutaneous methotrexate once weekly   Vibegron (GEMTESA) 75 MG TABS Take 75 mg by mouth daily.   No facility-administered encounter medications on file as of 12/25/2022.    Allergies (verified) Antihistamines, chlorpheniramine-type; Atenolol; Cefuroxime axetil; Ciprofloxacin; Co q 10 [coenzyme q10]; Crestor [rosuvastatin calcium]; Dextromethorphan-guaifenesin; Epinephrine; Ramipril; Sulfamethoxazole-trimethoprim; and Vitamin d analogs   History: Past Medical History:  Diagnosis Date   A-fib ([place])    Anginal pain ([place])    Aortic insufficiency    a. 02/2020 Echo: EF 60-65%, no rwma, Gr1 DD, nl RV fxn, mildly  dil LA, triv AI, Asc Ao 93m; b. 04/2021 Echo: EF 60-65%, no rwma, GrI DD, nl RV fxn, mildly dil LA, AI not visualized. Mild-mod AoV sclerosis w/o stenosis.   Arthritis    Knees   B12 deficiency    Mild   Bucket-handle tear of lateral meniscus of left knee as current injury 12/30/2012   Cancer ([place]    Skin, basal cell on nose and eyelid   DJD (degenerative joint disease)    Low back   Dyspepsia    GERD (gastroesophageal reflux disease)    Heart murmur    Heart palpitations    History of cardiac cath    a. 02/2017 Cath: Nl cors. Nl LV fxn. Abd Aogram w/o RAS.   HOH (hard of hearing)    Hyperlipidemia    Hypertension    IBS (irritable bowel syndrome)    Lactose intolerance    Mixed incontinence    PONV (postoperative nausea and vomiting)    Vulvar irritation    from urine pad, uses Triamcinolone oint.   Past Surgical History:  Procedure Laterality Date   ABDOMINAL AORTOGRAM N/A 03/15/2017   Procedure: Abdominal Aortogram;  Surgeon: MWellington Hampshire MD;  [facility]  Service: Cardiovascular;  Laterality: N/A;   ABDOMINAL HYSTERECTOMY  1982   Large fibroids and bleeding   APPENDECTOMY     BILATERAL SALPINGOOPHORECTOMY  1986   BONE GRAFT HIP ILIAC CREST     To Left arm   BREAST REDUCTION SURGERY     BREAST SURGERY     CARDIAC CATHETERIZATION     CATARACT EXTRACTION W/PHACO Left 07/07/2016   Procedure: CATARACT EXTRACTION PHACO AND INTRAOCULAR LENS PLACEMENT (IOC);  Surgeon: WBirder Robson MD;  Location: [facility];  Service: Ophthalmology;  Laterality: Left;  [place]:10 AP% 18.3 CDE 12.91 Fluid pak lot # [id number]   CATARACT EXTRACTION W/PHACO Right 07/28/2016   Procedure: CATARACT EXTRACTION PHACO AND INTRAOCULAR LENS PLACEMENT (IOC);  Surgeon: WBirder Robson MD;  Location: [facility];  Service: Ophthalmology;  Laterality: Right;  [place]01:26 AP% 18.5 CDE 16.12 Fluid pack lot # [id number]   COLONOSCOPY  07/2016   Dr. MThana Farr  DILATION AND CURETTAGE OF UTERUS      miscarragesx2   ESOPHAGOGASTRODUODENOSCOPY     Polyps   HEMORRHOID SURGERY  2006   KNEE ARTHROSCOPY WITH LATERAL MENISECTOMY Left 12/30/2012   Procedure: KNEE ARTHROSCOPY WITH LATERAL MENISECTOMY;  Surgeon: JJohnny Bridge MD;  [facility]  Service: Orthopedics;  Laterality: Left;  LEFT KNEE SCOPE LATERAL MENISCECTOMY   LEFT HEART CATH AND CORONARY ANGIOGRAPHY N/A 03/15/2017   Procedure: Left Heart Cath and Coronary Angiography;  Surgeon: MWellington Hampshire MD;  [facility];  Service: Cardiovascular;  Laterality: N/A;   MOUTH SURGERY  skin cancer eyelid  2012   TONSILLECTOMY     Family History  Problem Relation Age of Onset   Cancer Mother        Colon   Heart disease Father        CAD   Diabetes Sister    Hypothyroidism Sister    Diabetes Brother    Leukemia Brother    Esophageal cancer Brother    Cancer Paternal Aunt        Breast   Diabetes Sister    Alzheimer's disease Sister    Diabetes Brother    Breast cancer Daughter    Social History   Socioeconomic History   Marital status: Married    Spouse name: Not on file   Number of children: 2   Years of education: Not on file   Highest education level: Not on file  Occupational History   Occupation: Architect: RETIRED  Tobacco Use   Smoking status: Former    Packs/day: 1.00    Years: 12.00    Total pack years: 12.00    Types: Cigarettes    Quit date: 11/24/1963    Years since quitting: 59.1   Smokeless tobacco: Never  Vaping Use   Vaping Use: Never used  Substance and Sexual Activity   Alcohol use: No    Alcohol/week: 0.0 standard drinks of alcohol   Drug use: No   Sexual activity: Yes  Other Topics Concern   Not on file  Social History Narrative   Metallurgist.     Social Determinants of Health   Financial Resource Strain: Low Risk  (12/25/2022)   Overall Financial Resource Strain (CARDIA)    Difficulty of Paying Living Expenses: Not hard  at all  Food Insecurity: No Food Insecurity (12/25/2022)   Hunger Vital Sign    Worried About Running Out of Food in the Last Year: Never true    Ran Out of Food in the Last Year: Never true  Transportation Needs: No Transportation Needs (12/25/2022)   PRAPARE - Hydrologist (Medical): No    Lack of Transportation (Non-Medical): No  Physical Activity: Insufficiently Active (12/25/2022)   Exercise Vital Sign    Days of Exercise per Week: 7 days    Minutes of Exercise per Session: 10 min  Stress: No Stress Concern Present (12/25/2022)   [place]    Feeling of Stress : Not at all  Social Connections: Moderately Isolated (12/25/2022)   Social Connection and Isolation Panel [NHANES]    Frequency of Communication with Friends and Family: More than three times a week    Frequency of Social Gatherings with Friends and Family: More than three times a week    Attends Religious Services: Never    Marine scientist or Organizations: No    Attends Music therapist: Never    Marital Status: Married    Tobacco Counseling Counseling given: Not Answered   Clinical Intake:  Pre-visit preparation completed: Yes        Diabetes: No  How often do you need to have someone help you when you read instructions, pamphlets, or other written materials from your doctor or pharmacy?: 1 - Never    Interpreter Needed?: No      Activities of Daily Living    12/25/2022   12:40 PM  In your present state of health, do you have any difficulty performing the  following activities:  Hearing? 1  Comment Hearing aids  Vision? 0  Difficulty concentrating or making decisions? 0  Walking or climbing stairs? 1  Comment Cane in use as needed. Paces self with activity.  Dressing or bathing? 0  Doing errands, shopping? 0  Preparing Food and eating ? N  Using the Toilet? N  In the past six months, have  you accidently leaked urine? N  Comment Followed by Urology. Managed with daily liner.  Do you have problems with loss of bowel control? N  Managing your Medications? N  Managing your Finances? N  Housekeeping or managing your Housekeeping? N    Patient Care Team: [name], [name], MD as PCP - General Wellington Hampshire, MD as PCP - Cardiology (Cardiology) Susa Day, MD (Orthopedic Surgery) Richmond Campbell, MD (Gastroenterology) Wellington Hampshire, MD as Consulting Physician (Cardiology) Birder Robson, MD as Referring Physician (Ophthalmology) Charlton Haws, [facility] as Pharmacist (Pharmacist)  Indicate any recent Medical Services you may have received from other than [facility] providers in the past year (date may be approximate).     Assessment:   This is a routine wellness examination for [place].  I connected with  [name] on 12/25/22 by a audio enabled telemedicine application and verified that I am speaking with the correct person using two identifiers.  Patient Location: Home  Provider Location: Office/Clinic  I discussed the limitations of evaluation and management by telemedicine. The patient expressed understanding and agreed to proceed.   Hearing/Vision screen Hearing Screening - Comments:: Wears hearing aids Vision Screening - Comments:: Followed by [facility], Dr. George Ina Last exam 07/2022 Cataracts extracted, bilateral  Dietary issues and exercise activities discussed: Current Exercise Habits: Home exercise routine, Type of exercise: walking, Time (Minutes): 30, Frequency (Times/Week): 5, Weekly Exercise (Minutes/Week): 150, Intensity: Mild   Goals Addressed             This Visit's Progress    Maintain Healthy Lifestyle       Stay active Healthy diet       Depression Screen    12/25/2022   12:37 PM 12/24/2021    1:26 PM 06/12/2021   12:02 PM 04/26/2020    9:04 AM 04/25/2019   10:48 AM 04/21/2018   11:20 AM 04/16/2017    9:34 AM   PHQ 2/9 Scores  PHQ - 2 Score 0 0 0 0 0 0 0  PHQ- 9 Score   4 0 0 0     Fall Risk    12/25/2022   12:39 PM 12/24/2021    1:25 PM 06/12/2021   12:02 PM 04/26/2020    9:04 AM 04/25/2019   10:48 AM  Fall Risk   Falls in the past year? 0 0 0 0 0  Number falls in past yr: 0 0 0 0   Injury with Fall?  0 0 0   Risk for fall due to :    Medication side effect   Follow up Falls evaluation completed;Falls prevention discussed Falls prevention discussed  Falls evaluation completed;Falls prevention discussed     FALL RISK PREVENTION PERTAINING TO THE HOME: Home free of loose throw rugs in walkways, pet beds, electrical cords, etc? Yes  Adequate lighting in your home to reduce risk of falls? Yes   ASSISTIVE DEVICES UTILIZED TO PREVENT FALLS: Life alert? No  Use of a cane, walker or w/c? Yes , as needed. Grab bars in the bathroom? Yes  Shower chair or bench in shower? Yes  Elevated toilet seat or a handicapped toilet? Yes   TIMED UP AND GO: Was the test performed? No .   Cognitive Function:    04/26/2020    9:06 AM 04/25/2019   11:29 AM 04/21/2018   11:20 AM 04/16/2017    9:35 AM 04/14/2016    3:00 PM  MMSE - Mini Mental State Exam  Orientation to time '5 5 5 5 5  '$ Orientation to Place '5 5 5 5 5  '$ Registration '3 3 3 3 3  '$ Attention/ Calculation 5 0 0 0 0  Recall '3 2 3 3 3  '$ Recall-comments  unable to recall 1 of 3 words     Language- name 2 objects  0 0 0 0  Language- repeat '1 1 1 1 1  '$ Language- follow 3 step command  0 '3 3 3  '$ Language- read & follow direction  0 0 0 0  Write a sentence  0 0 0 0  Copy design  0 0 0 0  Total score  '16 20 20 20        '$ 12/25/2022   12:44 PM 12/24/2021    1:28 PM  6CIT Screen  What Year? 0 points 0 points  What month? 0 points 0 points  What time? 0 points 0 points  Count back from 20 0 points 0 points  Months in reverse 0 points 0 points  Repeat phrase 0 points 0 points  Total Score 0 points 0 points    Immunizations Immunization History   Administered Date(s) Administered   Fluad Quad(high Dose 65+) 10/16/2020, 07/24/2022   Influenza Split 07/24/2011, 08/23/2014   Influenza Whole 08/23/2004, 07/24/2009, 08/06/2010, 08/05/2012   Influenza, High Dose Seasonal PF 08/11/2016, 08/27/2017, 08/02/2018, 09/05/2021   Influenza,inj,Quad PF,6+ Mos 08/16/2013, 08/07/2015   Influenza-Unspecified 09/05/2021   PFIZER(Purple Top)SARS-COV-2 Vaccination 12/15/2019, 01/05/2020, 10/25/2020   Pneumococcal Conjugate-13 08/24/2014   Pneumococcal Polysaccharide-23 08/20/2008   Td 03/20/2003, 02/28/2013   Zoster, Live 09/04/2015   Shingrix Completed?: No.    Education has been provided regarding the importance of this vaccine. Patient has been advised to call insurance company to determine out of pocket expense if they have not yet received this vaccine. Advised may also receive vaccine at local pharmacy or Health Dept. Verbalized acceptance and understanding.  Screening Tests Health Maintenance  Topic Date Due   MAMMOGRAM  01/14/2022   COVID-19 Vaccine (4 - 2023-24 season) 01/10/2023 (Originally 07/24/2022)   Zoster Vaccines- Shingrix (1 of 2) 03/25/2023 (Originally 03/23/1954)   DTaP/Tdap/Td (3 - Tdap) 03/01/2023   Medicare Annual Wellness (AWV)  12/26/2023   Pneumonia Vaccine 51+ Years old  Completed   INFLUENZA VACCINE  Completed   DEXA SCAN  Completed   HPV VACCINES  Aged Out    Health Maintenance Health Maintenance Due  Topic Date Due   MAMMOGRAM  01/14/2022   Mammogram- declined.   Lung Cancer Screening: (Low Dose CT Chest recommended if Age 69-80 years, 30 pack-year currently smoking OR have quit w/in 15years.) does not qualify.   Hepatitis C Screening: does not qualify.  Vision Screening: Recommended annual ophthalmology exams for early detection of glaucoma and other disorders of the eye.  Dental Screening: Recommended annual dental exams for proper oral hygiene.  Community Resource Referral / Chronic Care Management: CRR  required this visit?  No   CCM required this visit?  No      Plan:     I have personally reviewed and noted the following in the patient's chart:  Medical and social history Use of alcohol, tobacco or illicit drugs  Current medications and supplements including opioid prescriptions. Patient is currently taking opioid prescriptions. Information provided to patient regarding non-opioid alternatives. Patient advised to discuss non-opioid treatment plan with their provider. Taking Tramadol as directed. Followed by Ortho.  Functional ability and status Nutritional status Physical activity Advanced directives List of other physicians Hospitalizations, surgeries, and ER visits in previous 12 months Vitals Screenings to include cognitive, depression, and falls Referrals and appointments  In addition, I have reviewed and discussed with patient certain preventive protocols, quality metrics, and best practice recommendations. A written personalized care plan for preventive services as well as general preventive health recommendations were provided to patient.     Leta Jungling, LPN   05/26/9448

## 2022-12-25 NOTE — Patient Instructions (Addendum)
Tammy Manning , Thank you for taking time to come for your Medicare Wellness Visit. I appreciate your ongoing commitment to your health goals. Please review the following plan we discussed and let me know if I can assist you in the future.   These are the goals we discussed:  Goals Addressed             This Visit's Progress    Maintain Healthy Lifestyle       Stay active Healthy diet         This is a list of the screening recommended for you and due dates:  Health Maintenance  Topic Date Due   Mammogram  01/14/2022   COVID-19 Vaccine (4 - 2023-24 season) 01/10/2023*   Zoster (Shingles) Vaccine (1 of 2) 03/25/2023*   DTaP/Tdap/Td vaccine (3 - Tdap) 03/01/2023   Medicare Annual Wellness Visit  12/26/2023   Pneumonia Vaccine  Completed   Flu Shot  Completed   DEXA scan (bone density measurement)  Completed   HPV Vaccine  Aged Out  *Topic was postponed. The date shown is not the original due date.    Advanced directives: on file  Conditions/risks identified: none new  Next appointment: Follow up in one year for your annual wellness visit    Preventive Care 65 Years and Older, Female Preventive care refers to lifestyle choices and visits with your health care provider that can promote health and wellness. What does preventive care include? A yearly physical exam. This is also called an annual well check. Dental exams once or twice a year. Routine eye exams. Ask your health care provider how often you should have your eyes checked. Personal lifestyle choices, including: Daily care of your teeth and gums. Regular physical activity. Eating a healthy diet. Avoiding tobacco and drug use. Limiting alcohol use. Practicing safe sex. Taking low-dose aspirin every day. Taking vitamin and mineral supplements as recommended by your health care provider. What happens during an annual well check? The services and screenings done by your health care provider during your annual  well check will depend on your age, overall health, lifestyle risk factors, and family history of disease. Counseling  Your health care provider may ask you questions about your: Alcohol use. Tobacco use. Drug use. Emotional well-being. Home and relationship well-being. Sexual activity. Eating habits. History of falls. Memory and ability to understand (cognition). Work and work Statistician. Reproductive health. Screening  You may have the following tests or measurements: Height, weight, and BMI. Blood pressure. Lipid and cholesterol levels. These may be checked every 5 years, or more frequently if you are over 30 years old. Skin check. Lung cancer screening. You may have this screening every year starting at age 70 if you have a 30-pack-year history of smoking and currently smoke or have quit within the past 15 years. Fecal occult blood test (FOBT) of the stool. You may have this test every year starting at age 59. Flexible sigmoidoscopy or colonoscopy. You may have a sigmoidoscopy every 5 years or a colonoscopy every 10 years starting at age 85. Hepatitis C blood test. Hepatitis B blood test. Sexually transmitted disease (STD) testing. Diabetes screening. This is done by checking your blood sugar (glucose) after you have not eaten for a while (fasting). You may have this done every 1-3 years. Bone density scan. This is done to screen for osteoporosis. You may have this done starting at age 45. Mammogram. This may be done every 1-2 years. Talk to your health care  provider about how often you should have regular mammograms. Talk with your health care provider about your test results, treatment options, and if necessary, the need for more tests. Vaccines  Your health care provider may recommend certain vaccines, such as: Influenza vaccine. This is recommended every year. Tetanus, diphtheria, and acellular pertussis (Tdap, Td) vaccine. You may need a Td booster every 10 years. Zoster  vaccine. You may need this after age 41. Pneumococcal 13-valent conjugate (PCV13) vaccine. One dose is recommended after age 47. Pneumococcal polysaccharide (PPSV23) vaccine. One dose is recommended after age 67. Talk to your health care provider about which screenings and vaccines you need and how often you need them. This information is not intended to replace advice given to you by your health care provider. Make sure you discuss any questions you have with your health care provider. Document Released: 12/06/2015 Document Revised: 07/29/2016 Document Reviewed: 09/10/2015 Elsevier Interactive Patient Education  2017 [place] Prevention in the Home Falls can cause injuries. They can happen to people of all ages. There are many things you can do to make your home safe and to help prevent falls. What can I do on the outside of my home? Regularly fix the edges of walkways and driveways and fix any cracks. Remove anything that might make you trip as you walk through a door, such as a raised step or threshold. Trim any bushes or trees on the path to your home. Use bright outdoor lighting. Clear any walking paths of anything that might make someone trip, such as rocks or tools. Regularly check to see if handrails are loose or broken. Make sure that both sides of any steps have handrails. Any raised decks and porches should have guardrails on the edges. Have any leaves, snow, or ice cleared regularly. Use sand or salt on walking paths during winter. Clean up any spills in your garage right away. This includes oil or grease spills. What can I do in the bathroom? Use night lights. Install grab bars by the toilet and in the tub and shower. Do not use towel bars as grab bars. Use non-skid mats or decals in the tub or shower. If you need to sit down in the shower, use a plastic, non-slip stool. Keep the floor dry. Clean up any water that spills on the floor as soon as it happens. Remove  soap buildup in the tub or shower regularly. Attach bath mats securely with double-sided non-slip rug tape. Do not have throw rugs and other things on the floor that can make you trip. What can I do in the bedroom? Use night lights. Make sure that you have a light by your bed that is easy to reach. Do not use any sheets or blankets that are too big for your bed. They should not hang down onto the floor. Have a firm chair that has side arms. You can use this for support while you get dressed. Do not have throw rugs and other things on the floor that can make you trip. What can I do in the kitchen? Clean up any spills right away. Avoid walking on wet floors. Keep items that you use a lot in easy-to-reach places. If you need to reach something above you, use a strong step stool that has a grab bar. Keep electrical cords out of the way. Do not use floor polish or wax that makes floors slippery. If you must use wax, use non-skid floor wax. Do not have throw rugs  and other things on the floor that can make you trip. What can I do with my stairs? Do not leave any items on the stairs. Make sure that there are handrails on both sides of the stairs and use them. Fix handrails that are broken or loose. Make sure that handrails are as long as the stairways. Check any carpeting to make sure that it is firmly attached to the stairs. Fix any carpet that is loose or worn. Avoid having throw rugs at the top or bottom of the stairs. If you do have throw rugs, attach them to the floor with carpet tape. Make sure that you have a light switch at the top of the stairs and the bottom of the stairs. If you do not have them, ask someone to add them for you. What else can I do to help prevent falls? Wear shoes that: Do not have high heels. Have rubber bottoms. Are comfortable and fit you well. Are closed at the toe. Do not wear sandals. If you use a stepladder: Make sure that it is fully opened. Do not climb a  closed stepladder. Make sure that both sides of the stepladder are locked into place. Ask someone to hold it for you, if possible. Clearly mark and make sure that you can see: Any grab bars or handrails. First and last steps. Where the edge of each step is. Use tools that help you move around (mobility aids) if they are needed. These include: Canes. Walkers. Scooters. Crutches. Turn on the lights when you go into a dark area. Replace any light bulbs as soon as they burn out. Set up your furniture so you have a clear path. Avoid moving your furniture around. If any of your floors are uneven, fix them. If there are any pets around you, be aware of where they are. Review your medicines with your doctor. Some medicines can make you feel dizzy. This can increase your chance of falling. Ask your doctor what other things that you can do to help prevent falls. This information is not intended to replace advice given to you by your health care provider. Make sure you discuss any questions you have with your health care provider. Document Released: 09/05/2009 Document Revised: 04/16/2016 Document Reviewed: 12/14/2014 Elsevier Interactive Patient Education  2017 [place]. Opioid Pain Medicine Management Opioids are powerful medicines that are used to treat moderate to severe pain. When used for short periods of time, they can help you to: Sleep better. Do better in physical or occupational therapy. Feel better in the first few days after an injury. Recover from surgery. Opioids should be taken with the supervision of a trained health care provider. They should be taken for the shortest period of time possible. This is because opioids can be addictive, and the longer you take opioids, the greater your risk of addiction. This addiction can also be called opioid use disorder. What are the risks? Using opioid pain medicines for longer than 3 days increases your risk of side effects. Side effects  include: Constipation. Nausea and vomiting. Breathing difficulties (respiratory depression). Drowsiness. Confusion. Opioid use disorder. Itching. Taking opioid pain medicine for a long period of time can affect your ability to do daily tasks. It also puts you at risk for: Motor vehicle crashes. Depression. Suicide. Heart attack. Overdose, which can be life-threatening. What is a pain treatment plan? A pain treatment plan is an agreement between you and your health care provider. Pain is unique to each person, and  treatments vary depending on your condition. To manage your pain, you and your health care provider need to work together. To help you do this: Discuss the goals of your treatment, including how much pain you might expect to have and how you will manage the pain. Review the risks and benefits of taking opioid medicines. Remember that a good treatment plan uses more than one approach and minimizes the chance of side effects. Be honest about the amount of medicines you take and about any drug or alcohol use. Get pain medicine prescriptions from only one health care provider. Pain can be managed with many types of alternative treatments. Ask your health care provider to refer you to one or more specialists who can help you manage pain through: Physical or occupational therapy. Counseling (cognitive behavioral therapy). Good nutrition. Biofeedback. Massage. Meditation. Non-opioid medicine. Following a gentle exercise program. How to use opioid pain medicine Taking medicine Take your pain medicine exactly as told by your health care provider. Take it only when you need it. If your pain gets less severe, you may take less than your prescribed dose if your health care provider approves. If you are not having pain, do nottake pain medicine unless your health care provider tells you to take it. If your pain is severe, do nottry to treat it yourself by taking more pills than  instructed on your prescription. Contact your health care provider for help. Write down the times when you take your pain medicine. It is easy to become confused while on pain medicine. Writing the time can help you avoid overdose. Take other over-the-counter or prescription medicines only as told by your health care provider. Keeping yourself and others safe  While you are taking opioid pain medicine: Do not drive, use machinery, or power tools. Do not sign legal documents. Do not drink alcohol. Do not take sleeping pills. Do not supervise children by yourself. Do not do activities that require climbing or being in high places. Do not go to a lake, river, ocean, spa, or swimming pool. Do not share your pain medicine with anyone. Keep pain medicine in a locked cabinet or in a secure area where pets and children cannot reach it. Stopping your use of opioids If you have been taking opioid medicine for more than a few weeks, you may need to slowly decrease (taper) how much you take until you stop completely. Tapering your use of opioids can decrease your risk of symptoms of withdrawal, such as: Pain and cramping in the abdomen. Nausea. Sweating. Sleepiness. Restlessness. Uncontrollable shaking (tremors). Cravings for the medicine. Do not attempt to taper your use of opioids on your own. Talk with your health care provider about how to do this. Your health care provider may prescribe a step-down schedule based on how much medicine you are taking and how long you have been taking it. Getting rid of leftover pills Do not save any leftover pills. Get rid of leftover pills safely by: Taking the medicine to a prescription take-back program. This is usually offered by the county or law enforcement. Bringing them to a pharmacy that has a drug disposal container. Flushing them down the toilet. Check the label or package insert of your medicine to see whether this is safe to do. Throwing them out in  the trash. Check the label or package insert of your medicine to see whether this is safe to do. If it is safe to throw it out, remove the medicine from the original container,  put it into a sealable bag or container, and mix it with used coffee grounds, food scraps, dirt, or cat litter before putting it in the trash. Follow these instructions at home: Activity Do exercises as told by your health care provider. Avoid activities that make your pain worse. Return to your normal activities as told by your health care provider. Ask your health care provider what activities are safe for you. General instructions You may need to take these actions to prevent or treat constipation: Drink enough fluid to keep your urine pale yellow. Take over-the-counter or prescription medicines. Eat foods that are high in fiber, such as beans, whole grains, and fresh fruits and vegetables. Limit foods that are high in fat and processed sugars, such as fried or sweet foods. Keep all follow-up visits. This is important. Where to find support If you have been taking opioids for a long time, you may benefit from receiving support for quitting from a local support group or counselor. Ask your health care provider for a referral to these resources in your area. Where to find more information Centers for Disease Control and Prevention (CDC): http://www.wolf.info/ [place] Food and Drug Administration (FDA): [place] Get help right away if: You may have taken too much of an opioid (overdosed). Common symptoms of an overdose: Your breathing is slower or more shallow than normal. You have a very slow heartbeat (pulse). You have slurred speech. You have nausea and vomiting. Your pupils become very small. You have other potential symptoms: You are very confused. You faint or feel like you will faint. You have cold, clammy skin. You have blue lips or fingernails. You have thoughts of harming yourself or harming others. These  symptoms may represent a serious problem that is an emergency. Do not wait to see if the symptoms will go away. Get medical help right away. Call your local emergency services (911 in the [place] Do not drive yourself to the hospital.  If you ever feel like you may hurt yourself or others, or have thoughts about taking your own life, get help right away. Go to your nearest emergency department or: Call your local emergency services (911 in the [place] Call the [facility] 7746100298 in the [place] Call a suicide crisis helpline, such as the [place] at 857-280-1706 or 988 in the [place]. This is open 24 hours a day in the [place] Text the Crisis Text Line at (979)600-8074 (in the [place].). Summary Opioid medicines can help you manage moderate to severe pain for a short period of time. A pain treatment plan is an agreement between you and your health care provider. Discuss the goals of your treatment, including how much pain you might expect to have and how you will manage the pain. If you think that you or someone else may have taken too much of an opioid, get medical help right away. This information is not intended to replace advice given to you by your health care provider. Make sure you discuss any questions you have with your health care provider. Document Revised: 06/04/2021 Document Reviewed: 02/19/2021 Elsevier Patient Education  [place].

## 2022-12-28 ENCOUNTER — Telehealth: Payer: Self-pay | Admitting: Internal Medicine

## 2022-12-28 ENCOUNTER — Other Ambulatory Visit: Payer: Self-pay | Admitting: Internal Medicine

## 2022-12-28 NOTE — Telephone Encounter (Signed)
Patient left a voicemail stating [facility] told her that Dr. Benjamine Mola refused her refill request for Methotrexate.  Patient states "I haven't seen Dr. Benjamine Mola since he was out babysitting so I don't know what is going on."  Patient states if she needs to schedule an appointment she can do that, but she needs her prescription filled ASAP because she doesn't know if she even has one full dose remaining.

## 2022-12-28 NOTE — Telephone Encounter (Signed)
Patient is in need of an appointment and labs. I advised patient and she is now scheduled for 12/30/2022 at 11:20 for a follow up and to update labs. Advised patient that refill could possibly be sent at that appointment when she sees Dr. Benjamine Mola. Patient verbalized understanding.

## 2022-12-30 ENCOUNTER — Ambulatory Visit: Payer: Medicare Other | Attending: Internal Medicine | Admitting: Internal Medicine

## 2022-12-30 ENCOUNTER — Other Ambulatory Visit: Payer: Self-pay

## 2022-12-30 ENCOUNTER — Encounter: Payer: Self-pay | Admitting: Internal Medicine

## 2022-12-30 VITALS — BP 144/75 | HR 69 | Ht 64.0 in | Wt 178.0 lb

## 2022-12-30 DIAGNOSIS — M059 Rheumatoid arthritis with rheumatoid factor, unspecified: Secondary | ICD-10-CM

## 2022-12-30 DIAGNOSIS — Z79899 Other long term (current) drug therapy: Secondary | ICD-10-CM

## 2022-12-30 DIAGNOSIS — S83252D Bucket-handle tear of lateral meniscus, current injury, left knee, subsequent encounter: Secondary | ICD-10-CM

## 2022-12-30 DIAGNOSIS — R197 Diarrhea, unspecified: Secondary | ICD-10-CM | POA: Diagnosis not present

## 2022-12-30 MED ORDER — "TUBERCULIN-ALLERGY SYRINGES 27G X 1/2"" 1 ML MISC": 3 refills | Status: DC

## 2022-12-30 MED ORDER — TUBERCULIN SYRINGE 26G X 3/8" 1 ML MISC
0 refills | Status: DC
Start: 2022-12-30 — End: 2023-03-19

## 2022-12-30 MED ORDER — METHOTREXATE SODIUM CHEMO INJECTION 50 MG/2ML: 15.0000 mg | INTRAMUSCULAR | 2 refills | Status: DC

## 2022-12-30 NOTE — Progress Notes (Signed)
Pharmacy was out of stock of syringes that were sent in earlier today. They have the size pended in stock. Please review and sign rx. Thanks!

## 2022-12-30 NOTE — Progress Notes (Signed)
Office Visit Note  Patient: Tammy Manning             Date of Birth: 1935/08/29           MRN: [id number]             PCP: Abner Greenspan, MD Referring: [name], [name], MD Visit Date: 12/30/2022   Subjective:   History of Present Illness: Tammy Manning is a 88 y.o. female here for follow up for seropositive RA on methotrexate 15 mg subcu weekly and folic acid 1 mg daily.  Since her last visit she still had some ongoing pain most persistently in the neck and shoulders bilaterally but without any severe flareup compared to before starting treatment.  She has been experiencing more pain affecting her left knee where there is known severe osteoarthritis.  She started taking a lot of Tylenol about 3 times every day 3 to 4 weeks ago but started to develop right-sided flank pain and diarrhea with yellow discolored stools.  She decreased to taking Tylenol no more than once daily with improvement of the symptoms.  She saw Dr. Mardelle Matte with intra-articular steroid injection 2 weeks ago with a good improvement in symptoms.  She has not had any significant interval infections.   Previous HPI 03/09/22 Tammy Manning is a 88 y.o. female here for follow up for seropositive RA on MTX 15 mg [place] weekly. She is doing very well since last visit joint pains almost all better except bilateral hips bothering her intermittently. Left knee also creaky but not much inflammation problem. She has persistent stomach irritation, not much relief with taking pepcid as needed. [facility] not able to fill methot[facility]ate Rx from supply issue but she still had medication on hand. She saw urology recently with persistent abnormal UA and cultures but mostly asymptomatic.   Previous HPI 12/29/21 Tammy Manning is a 88 y.o. female here for follow up for RA after restarting methotrexate at 15 mg [place] weekly, which had been delayed due to repeated interruptions from UTI infections and antibiotic treatment. She  has noticed improvement in joint symptoms after about 2 weeks taking the methotrexate. She continues to have some nausea when she takes this despite subcutaneous route.   Previous HPI 11/25/20 Tammy Manning is a 88 y.o. female with a history of afib, TIA, and OA here for evaluation of joint pain and positive rheumatoid factor. She has joint pain in multiple sites including both hands but also has a lot of pain with the right shoulder that started more acutely with suspected rotator cuff tear. 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 She does notice swelling in hands intermittently. She has morning stiffness lasting less than 30 minutes daily also persistent pain throughout the day. She has had left radius fracture with surgical repair but no other major surgery of her arms.   Review of Systems  Constitutional:  Positive for fatigue.  HENT:  Positive for mouth dryness. Negative for mouth sores.   Eyes: Negative.  Negative for dryness.  Respiratory: Negative.  Negative for shortness of breath.   Cardiovascular: Negative.  Negative for chest pain and palpitations.  Gastrointestinal:  Positive for diarrhea. Negative for blood in stool.  Endocrine: Negative.  Negative for increased urination.  Genitourinary: Negative.  Negative for involuntary urination.  Musculoskeletal:  Positive for joint pain, joint pain, joint swelling, myalgias, muscle weakness, morning stiffness, muscle tenderness and myalgias. Negative  for gait problem.  Skin: Negative.  Negative for color change, rash, hair loss and sensitivity to sunlight.  Allergic/Immunologic: Negative.  Negative for susceptible to infections.  Neurological: Negative.  Negative for dizziness and headaches.  Hematological: Negative.  Negative for swollen glands.  Psychiatric/Behavioral:  Positive for sleep disturbance. Negative for depressed mood. The patient is not nervous/anxious.     PMFS  History:  Patient Active Problem List   Diagnosis Date Noted   Sore throat 08/28/2022   Diarrhea 04/09/2022   Multinodular goiter 02/27/2022   Encounter for screening mammogram for breast cancer 01/23/2022   Recurrent UTI 11/25/2021   High risk medication use 10/07/2021   Atrial fibrillation with RVR ([place]) 04/28/2021   Hypertrophic toenail 04/22/2021   Pedal edema 03/24/2021   Seropositive rheumatoid arthritis ([place]) 11/25/2020   Bilateral hand pain 11/25/2020   Bilateral shoulder pain 10/16/2020   Joint pain 10/16/2020   Estrogen deficiency 04/30/2020   Dry mouth 03/15/2020   Osteoarthritis of right hip 01/02/2020   Groin pain, right 01/01/2020   Right thyroid nodule 05/12/2018   History of TIA (transient ischemic attack) 05/09/2018   Aortic insufficiency 09/10/2015   Stress reaction 09/08/2015   Strep throat 05/31/2015   Encounter for Medicare annual wellness exam 02/28/2013   Bucket-handle tear of lateral meniscus of left knee as current injury 12/30/2012   Episodic atrial fibrillation ([place]) 11/17/2012   Special screening for malignant neoplasms, colon 01/21/2012   Hearing loss of both ears 01/14/2012   HEADACHE, CHRONIC 09/24/2010   B12 deficiency 03/17/2010   GERD (gastroesophageal reflux disease) 03/17/2010   POSTMENOPAUSAL STATUS 08/20/2008   Essential hypertension 07/19/2007   Hyperlipidemia 06/24/2007   IBS 06/24/2007   DERMATITIS, ATOPIC 06/24/2007   SKIN CANCER, HX OF 06/24/2007    Past Medical History:  Diagnosis Date   A-fib ([place])    Anginal pain ([place])    Aortic insufficiency    a. 02/2020 Echo: EF 60-65%, no rwma, Gr1 DD, nl RV fxn, mildly dil LA, triv AI, Asc Ao 12m; b. 04/2021 Echo: EF 60-65%, no rwma, GrI DD, nl RV fxn, mildly dil LA, AI not visualized. Mild-mod AoV sclerosis w/o stenosis.   Arthritis    Knees   B12 deficiency    Mild   Bucket-handle tear of lateral meniscus of left knee as current injury 12/30/2012   Cancer ([place]    Skin, basal cell  on nose and eyelid   DJD (degenerative joint disease)    Low back   Dyspepsia    GERD (gastroesophageal reflux disease)    Heart murmur    Heart palpitations    History of cardiac cath    a. 02/2017 Cath: Nl cors. Nl LV fxn. Abd Aogram w/o RAS.   HOH (hard of hearing)    Hyperlipidemia    Hypertension    IBS (irritable bowel syndrome)    Lactose intolerance    Mixed incontinence    PONV (postoperative nausea and vomiting)    Vulvar irritation    from urine pad, uses Triamcinolone oint.    Family History  Problem Relation Age of Onset   Cancer Mother        Colon   Heart disease Father        CAD   Diabetes Sister    Hypothyroidism Sister    Diabetes Brother    Leukemia Brother    Esophageal cancer Brother    Cancer Paternal Aunt        Breast   Diabetes  Sister    Alzheimer's disease Sister    Diabetes Brother    Breast cancer Daughter    Past Surgical History:  Procedure Laterality Date   ABDOMINAL AORTOGRAM N/A 03/15/2017   Procedure: Abdominal Aortogram;  Surgeon: Wellington Hampshire, MD;  Location: [facility];  Service: Cardiovascular;  Laterality: N/A;   ABDOMINAL HYSTERECTOMY  1982   Large fibroids and bleeding   APPENDECTOMY     BILATERAL SALPINGOOPHORECTOMY  1986   BONE GRAFT HIP ILIAC CREST     To Left arm   BREAST REDUCTION SURGERY     BREAST SURGERY     CARDIAC CATHETERIZATION     CATARACT EXTRACTION W/PHACO Left 07/07/2016   Procedure: CATARACT EXTRACTION PHACO AND INTRAOCULAR LENS PLACEMENT (IOC);  Surgeon: Birder Robson, MD;  Location: [facility];  Service: Ophthalmology;  Laterality: Left;  [place] 01:10 AP% 18.3 CDE 12.91 Fluid pak lot # [id number]   CATARACT EXTRACTION W/PHACO Right 07/28/2016   Procedure: CATARACT EXTRACTION PHACO AND INTRAOCULAR LENS PLACEMENT (IOC);  Surgeon: Birder Robson, MD;  Location: [facility];  Service: Ophthalmology;  Laterality: Right;  [place] 01:26 AP% 18.5 CDE 16.12 Fluid pack lot # [id number]   COLONOSCOPY  07/2016    Dr. Thana Farr   DILATION AND CURETTAGE OF UTERUS     miscarragesx2   ESOPHAGOGASTRODUODENOSCOPY     Polyps   HEMORRHOID SURGERY  2006   KNEE ARTHROSCOPY WITH LATERAL MENISECTOMY Left 12/30/2012   Procedure: KNEE ARTHROSCOPY WITH LATERAL MENISECTOMY;  Surgeon: Johnny Bridge, MD;  Location: [facility];  Service: Orthopedics;  Laterality: Left;  LEFT KNEE SCOPE LATERAL MENISCECTOMY   LEFT HEART CATH AND CORONARY ANGIOGRAPHY N/A 03/15/2017   Procedure: Left Heart Cath and Coronary Angiography;  Surgeon: Wellington Hampshire, MD;  Location: [facility];  Service: Cardiovascular;  Laterality: N/A;   MOUTH SURGERY     skin cancer eyelid  2012   TONSILLECTOMY     Social History   Social History Narrative   Metallurgist.     Immunization History  Administered Date(s) Administered   Fluad Quad(high Dose 65+) 10/16/2020, 07/24/2022   Influenza Split 07/24/2011, 08/23/2014   Influenza Whole 08/23/2004, 07/24/2009, 08/06/2010, 08/05/2012   Influenza, High Dose Seasonal PF 08/11/2016, 08/27/2017, 08/02/2018, 09/05/2021   Influenza,inj,Quad PF,6+ Mos 08/16/2013, 08/07/2015   Influenza-Unspecified 09/05/2021   PFIZER(Purple Top)SARS-COV-2 Vaccination 12/15/2019, 01/05/2020, 10/25/2020   Pneumococcal Conjugate-13 08/24/2014   Pneumococcal Polysaccharide-23 08/20/2008   Td 03/20/2003, 02/28/2013   Zoster, Live 09/04/2015     Objective: Vital Signs: BP (!) 144/75 (BP Location: Left Arm, Patient Position: Sitting, Cuff Size: Normal)   Pulse 69   Ht '5\' 4"'$  (1.626 m)   Wt 178 lb (80.7 kg)   BMI 30.55 kg/m    Physical Exam Cardiovascular:     Rate and Rhythm: Normal rate and regular rhythm.  Pulmonary:     Effort: Pulmonary effort is normal.     Breath sounds: Normal breath sounds.  Musculoskeletal:     Right lower leg: No edema.     Left lower leg: No edema.  Skin:    General: Skin is warm and dry.     Findings: Bruising present. No rash.  Neurological:     Mental  Status: She is alert.  Psychiatric:        Mood and Affect: Mood normal.      Musculoskeletal Exam:  Shoulders full ROM some pain provoked with full abduction and external rotation at horizontal position  Elbows full ROM no tenderness or swelling Wrists full ROM no tenderness or swelling Fingers full ROM no tenderness or swelling Heberden's nodes of few DIP joints on both hands worst right second and third digits Knees left side with crepitus no tenderness to palpation slight discomfort with range of motion which is restricted in flexion and extension   CDAI Exam: CDAI Score: 7  Patient Global: 30 mm; Provider Global: 10 mm Swollen: 0 ; Tender: 3  Joint Exam 12/30/2022      Right  Left  Glenohumeral   Tender   Tender  Knee      Tender     Investigation: No additional findings.  Imaging: No results found.  Recent Labs: Lab Results  Component Value Date   WBC 6.4 06/23/2022   HGB 11.2 (L) 06/23/2022   PLT 302 06/23/2022   NA 139 06/23/2022   K 3.9 06/23/2022   CL 108 06/23/2022   CO2 24 06/23/2022   GLUCOSE 106 (H) 06/23/2022   BUN 21 06/23/2022   CREATININE 0.79 06/23/2022   BILITOT 0.6 04/09/2022   ALKPHOS 39 04/09/2022   AST 11 04/09/2022   ALT 11 04/09/2022   PROT 6.2 04/09/2022   ALBUMIN 3.8 04/09/2022   CALCIUM 9.4 06/23/2022   GFRAA 71 02/02/2020    Speciality Comments: No specialty comments available.  Procedures:  No procedures performed Allergies: Antihistamines, chlorpheniramine-type; Atenolol; Cefuroxime axetil; Ciprofloxacin; Co q 10 [coenzyme q10]; Crestor [rosuvastatin calcium]; Dextromethorphan-guaifenesin; Epinephrine; Ramipril; Sulfamethoxazole-trimethoprim; and Vitamin d analogs   Assessment / Plan:     Visit Diagnoses: Seropositive rheumatoid arthritis ([place]) - Plan: methotrexate 50 MG/2ML injection, TUBERCULIN SYR 1CC/26GX3/8" 26G X 3/8" 1 ML MISC, Sedimentation rate, C-reactive protein  Symptoms appear well-controlled still has some  pain more in proximal areas then peripheral at this time this may be more due to underlying degenerative arthritis.  Checking sed rate and CRP for disease activity monitoring.  Plan to continue methotrexate 15 mg subcu weekly and folic acid 1 mg daily.  High risk medication use - Plan: CBC with Differential/Platelet, COMPLETE METABOLIC PANEL WITH GFR  Checking CBC and CMP for methotrexate toxicity monitoring.  She has not had any significant interval infections.  Diarrhea, unspecified type  Symptoms provoked on high-dose of Tylenol was also having more joint pain at that time.  Not sure if there is any acute illness component.  Rechecking her kidney and liver function with routine monitoring labs as above would recommend staying below that previous high dosing of Tylenol 3 times daily.  Bucket-handle tear of lateral meniscus of left knee as current injury, subsequent encounter  Chronic left knee pain with limited range of motion due to underlying osteoarthritis seems to be worse on the side secondary to meniscal tear.  Doing well today after recent intra-articular steroid injection 2 weeks ago with Dr. Mardelle Matte.  Orders: Orders Placed This Encounter  Procedures   Sedimentation rate   C-reactive protein   CBC with Differential/Platelet   COMPLETE METABOLIC PANEL WITH GFR   Meds ordered this encounter  Medications   methotrexate 50 MG/2ML injection    Sig: Inject 0.6 mLs (15 mg total) into the skin once a week.    Dispense:  4 mL    Refill:  2   TUBERCULIN SYR 1CC/26GX3/8" 26G X 3/8" 1 ML MISC    Sig: For injection of subcutaneous methotrexate once weekly    Dispense:  25 each    Refill:  0    Pharmacy may substitute  for equivalent volume syringes and equipment eg needles as needed     Follow-Up Instructions: Return in about 3 months (around 03/30/2023) for RA on MTX f/u 3mo.   CCollier Salina MD  Note - This record has been created using [employer]  Chart creation  errors have been sought, but may not always  have been located. Such creation errors do not reflect on  the standard of medical care.

## 2022-12-31 LAB — COMPLETE METABOLIC PANEL WITH GFR
AG Ratio: 1.8 (calc) (ref 1.0–2.5)
ALT: 11 U/L (ref 6–29)
AST: 11 U/L (ref 10–35)
Albumin: 4.2 g/dL (ref 3.6–5.1)
Alkaline phosphatase (APISO): 77 U/L (ref 37–153)
BUN: 20 mg/dL (ref 7–25)
CO2: 28 mmol/L (ref 20–32)
Calcium: 10.1 mg/dL (ref 8.6–10.4)
Chloride: 103 mmol/L (ref 98–110)
Creat: 0.77 mg/dL (ref 0.60–0.95)
Globulin: 2.4 g/dL (calc) (ref 1.9–3.7)
Glucose, Bld: 86 mg/dL (ref 65–99)
Potassium: 5.2 mmol/L (ref 3.5–5.3)
Sodium: 139 mmol/L (ref 135–146)
Total Bilirubin: 0.4 mg/dL (ref 0.2–1.2)
Total Protein: 6.6 g/dL (ref 6.1–8.1)
eGFR: 76 mL/min/{1.73_m2} (ref >=60)

## 2022-12-31 LAB — CBC WITH DIFFERENTIAL/PLATELET
Absolute Monocytes: 1069 cells/uL — ABNORMAL HIGH (ref 200–950)
Basophils Absolute: 47 cells/uL (ref 0–200)
Basophils Relative: 0.6 %
Eosinophils Absolute: 94 cells/uL (ref 15–500)
Eosinophils Relative: 1.2 %
HCT: 36.5 % (ref 35.0–45.0)
Hemoglobin: 12.4 g/dL (ref 11.7–15.5)
Lymphs Abs: 2051 cells/uL (ref 850–3900)
MCH: 34.1 pg — ABNORMAL HIGH (ref 27.0–33.0)
MCHC: 34 g/dL (ref 32.0–36.0)
MCV: 100.3 fL — ABNORMAL HIGH (ref 80.0–100.0)
MPV: 9.1 fL (ref 7.5–12.5)
Monocytes Relative: 13.7 %
Neutro Abs: 4540 cells/uL (ref 1500–7800)
Neutrophils Relative %: 58.2 %
Platelets: 464 10*3/uL — ABNORMAL HIGH (ref 140–400)
RBC: 3.64 10*6/uL — ABNORMAL LOW (ref 3.80–5.10)
RDW: 14.2 % (ref 11.0–15.0)
Total Lymphocyte: 26.3 %
WBC: 7.8 10*3/uL (ref 3.8–10.8)

## 2022-12-31 LAB — C-REACTIVE PROTEIN: CRP: 8.7 mg/L — ABNORMAL HIGH (ref <8.0)

## 2022-12-31 LAB — SEDIMENTATION RATE: Sed Rate: 31 mm/h — ABNORMAL HIGH (ref 0–30)

## 2023-01-07 ENCOUNTER — Other Ambulatory Visit: Payer: Self-pay | Admitting: Internal Medicine

## 2023-01-07 DIAGNOSIS — M059 Rheumatoid arthritis with rheumatoid factor, unspecified: Secondary | ICD-10-CM

## 2023-01-20 ENCOUNTER — Other Ambulatory Visit: Payer: Self-pay

## 2023-01-20 MED ORDER — ESTRADIOL 0.1 MG/GM VA CREA: TOPICAL_CREAM | VAGINAL | 1 refills | Status: DC

## 2023-01-20 NOTE — Addendum Note (Signed)
Addended by: Kris Mouton on: 01/20/2023 04:47 PM   Modules accepted: Orders

## 2023-01-22 DIAGNOSIS — M25551 Pain in right hip: Secondary | ICD-10-CM | POA: Diagnosis not present

## 2023-01-26 ENCOUNTER — Encounter: Payer: Self-pay | Admitting: Family Medicine

## 2023-01-26 ENCOUNTER — Ambulatory Visit (INDEPENDENT_AMBULATORY_CARE_PROVIDER_SITE_OTHER): Payer: Medicare Other | Admitting: Family Medicine

## 2023-01-26 ENCOUNTER — Telehealth: Payer: Self-pay

## 2023-01-26 VITALS — BP 122/66 | HR 68 | Temp 97.8°F | Ht 64.0 in | Wt 180.0 lb

## 2023-01-26 DIAGNOSIS — E7800 Pure hypercholesterolemia, unspecified: Secondary | ICD-10-CM

## 2023-01-26 DIAGNOSIS — E538 Deficiency of other specified B group vitamins: Secondary | ICD-10-CM | POA: Diagnosis not present

## 2023-01-26 DIAGNOSIS — I10 Essential (primary) hypertension: Secondary | ICD-10-CM

## 2023-01-26 DIAGNOSIS — N3001 Acute cystitis with hematuria: Secondary | ICD-10-CM | POA: Insufficient documentation

## 2023-01-26 DIAGNOSIS — N3000 Acute cystitis without hematuria: Secondary | ICD-10-CM | POA: Diagnosis not present

## 2023-01-26 DIAGNOSIS — E2839 Other primary ovarian failure: Secondary | ICD-10-CM | POA: Diagnosis not present

## 2023-01-26 DIAGNOSIS — N390 Urinary tract infection, site not specified: Secondary | ICD-10-CM

## 2023-01-26 DIAGNOSIS — M8580 Other specified disorders of bone density and structure, unspecified site: Secondary | ICD-10-CM

## 2023-01-26 DIAGNOSIS — I480 Paroxysmal atrial fibrillation: Secondary | ICD-10-CM

## 2023-01-26 DIAGNOSIS — I77810 Thoracic aortic ectasia: Secondary | ICD-10-CM

## 2023-01-26 DIAGNOSIS — M059 Rheumatoid arthritis with rheumatoid factor, unspecified: Secondary | ICD-10-CM | POA: Diagnosis not present

## 2023-01-26 DIAGNOSIS — E041 Nontoxic single thyroid nodule: Secondary | ICD-10-CM

## 2023-01-26 LAB — POC URINALSYSI DIPSTICK (AUTOMATED)
Bilirubin, UA: NEGATIVE
Blood, UA: NEGATIVE
Glucose, UA: NEGATIVE
Ketones, UA: NEGATIVE
Nitrite, UA: POSITIVE
Protein, UA: NEGATIVE
Spec Grav, UA: 1.015 (ref 1.010–1.025)
Urobilinogen, UA: 0.2 E.U./dL
pH, UA: 6 (ref 5.0–8.0)

## 2023-01-26 MED ORDER — CEPHALEXIN 500 MG PO CAPS: 500.0000 mg | ORAL_CAPSULE | Freq: Two times a day (BID) | ORAL | 0 refills | Status: DC

## 2023-01-26 NOTE — Assessment & Plan Note (Signed)
In setting of a fib   bp in fair control at this time  BP Readings from Last 1 Encounters:  01/26/23 122/66   No changes needed Most recent labs reviewed  Disc lifstyle change with low sodium diet and exercise  Plan to continue Diltiazem 240 mg daily  avapro 300 mg daily  Rate is controlled

## 2023-01-26 NOTE — Assessment & Plan Note (Signed)
Dyuria with pos ua today Cx pending Tx with keflex-she tolerates well in past   Enc water intake Enc to disc the gemtasa with urology if it gives her any urinary hesitancy
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
## 2023-01-26 NOTE — Assessment & Plan Note (Addendum)
Stable under rheum care and doing vairly well with mtx  Reviewed last rheumatology labs

## 2023-01-26 NOTE — Assessment & Plan Note (Signed)
Disc goals for lipids and reasons to control them Rev last labs with pt Rev low sat fat diet in detail Plan to continue atorvastatin 10 mg daily   Lab ordered

## 2023-01-26 NOTE — Assessment & Plan Note (Signed)
Followed by cardiology No symptoms Bp is controlled

## 2023-01-26 NOTE — Assessment & Plan Note (Signed)
B12 ordered   Monthly shot today  Lab Results  Component Value Date   VITAMINB12 487 01/16/2022

## 2023-01-26 NOTE — Progress Notes (Signed)
Entered in Error

## 2023-01-26 NOTE — Assessment & Plan Note (Signed)
Mild from dexa 2021 Ordered another one  No falls or fx  Takes ca and D Walking for exercise  Enc her to add some strength training

## 2023-01-26 NOTE — Assessment & Plan Note (Signed)
Reassuring [place] last fall  No change in symptoms TSH ordered

## 2023-01-26 NOTE — Assessment & Plan Note (Signed)
Continues eliquis  Carrdiac care Rate controlled

## 2023-01-26 NOTE — Patient Instructions (Addendum)
We will call with your urine culture report  Take keflex for now  Drink water!   If symptoms worsen let us know    Check in with rheumatology  See if ok for you to get shingrix vaccine in the pharmacy    Call to schedule your bone density test  LB dexa  [phone]  Labs today   Keep taking good care of yourself

## 2023-01-27 ENCOUNTER — Other Ambulatory Visit (INDEPENDENT_AMBULATORY_CARE_PROVIDER_SITE_OTHER): Payer: Medicare Other

## 2023-01-27 DIAGNOSIS — I10 Essential (primary) hypertension: Secondary | ICD-10-CM | POA: Diagnosis not present

## 2023-01-27 DIAGNOSIS — E041 Nontoxic single thyroid nodule: Secondary | ICD-10-CM

## 2023-01-27 DIAGNOSIS — E7800 Pure hypercholesterolemia, unspecified: Secondary | ICD-10-CM

## 2023-01-27 DIAGNOSIS — E538 Deficiency of other specified B group vitamins: Secondary | ICD-10-CM

## 2023-01-27 LAB — LIPID PANEL
Cholesterol: 204 mg/dL — ABNORMAL HIGH (ref 0–200)
HDL: 88.5 mg/dL (ref >39.00)
LDL Cholesterol: 95 mg/dL (ref 0–99)
NonHDL: 115.1
Total CHOL/HDL Ratio: 2
Triglycerides: 102 mg/dL (ref 0.0–149.0)
VLDL: 20.4 mg/dL (ref 0.0–40.0)

## 2023-01-27 LAB — TSH: TSH: 1.45 u[IU]/mL (ref 0.35–5.50)

## 2023-01-27 LAB — VITAMIN B12: Vitamin B-12: 333 pg/mL (ref 211–911)

## 2023-01-28 LAB — URINE CULTURE
MICRO NUMBER:: 14651035
SPECIMEN QUALITY:: ADEQUATE

## 2023-02-02 ENCOUNTER — Other Ambulatory Visit: Payer: Self-pay | Admitting: Family Medicine

## 2023-02-02 ENCOUNTER — Encounter: Payer: Self-pay | Admitting: Family Medicine

## 2023-02-02 ENCOUNTER — Ambulatory Visit (INDEPENDENT_AMBULATORY_CARE_PROVIDER_SITE_OTHER): Payer: Medicare Other | Admitting: Family Medicine

## 2023-02-02 VITALS — BP 128/64 | HR 73 | Temp 98.0°F | Ht 64.0 in | Wt 182.1 lb

## 2023-02-02 DIAGNOSIS — I10 Essential (primary) hypertension: Secondary | ICD-10-CM

## 2023-02-02 DIAGNOSIS — J029 Acute pharyngitis, unspecified: Secondary | ICD-10-CM | POA: Diagnosis not present

## 2023-02-02 DIAGNOSIS — R32 Unspecified urinary incontinence: Secondary | ICD-10-CM | POA: Diagnosis not present

## 2023-02-02 DIAGNOSIS — K649 Unspecified hemorrhoids: Secondary | ICD-10-CM | POA: Insufficient documentation

## 2023-02-02 DIAGNOSIS — K582 Mixed irritable bowel syndrome: Secondary | ICD-10-CM | POA: Diagnosis not present

## 2023-02-02 DIAGNOSIS — E538 Deficiency of other specified B group vitamins: Secondary | ICD-10-CM | POA: Diagnosis not present

## 2023-02-02 DIAGNOSIS — N3000 Acute cystitis without hematuria: Secondary | ICD-10-CM

## 2023-02-02 DIAGNOSIS — J020 Streptococcal pharyngitis: Secondary | ICD-10-CM

## 2023-02-02 DIAGNOSIS — E7800 Pure hypercholesterolemia, unspecified: Secondary | ICD-10-CM

## 2023-02-02 LAB — POCT RAPID STREP A (OFFICE): Rapid Strep A Screen: POSITIVE — AB

## 2023-02-02 MED ORDER — HYDROCORTISONE (PERIANAL) 2.5 % EX CREA: 1.0000 | TOPICAL_CREAM | Freq: Every day | CUTANEOUS | 0 refills | Status: DC

## 2023-02-02 MED ORDER — AMOXICILLIN 500 MG PO CAPS: 1000.0000 mg | ORAL_CAPSULE | Freq: Every day | ORAL | 0 refills | Status: AC

## 2023-02-02 MED ORDER — CYANOCOBALAMIN 1000 MCG/ML IJ SOLN
1000.0000 ug | Freq: Once | INTRAMUSCULAR | Status: AC
  Administered 2023-02-02: 1000 ug via INTRAMUSCULAR

## 2023-02-02 NOTE — Assessment & Plan Note (Signed)
Some constipation  Has linzess from GI

## 2023-02-02 NOTE — Assessment & Plan Note (Signed)
B12 shot today. 

## 2023-02-02 NOTE — Patient Instructions (Addendum)
Try the anusol cream for rectal symtoms for 10 days  Try to avoid straining   Your strep test is positive Take the amoxicillin 1 gram daily for 10 days  Let me know if sore throat does not improve   B12 shot today   Talk to your urologist about urinary incontinence  You may need a follow up to discuss the gemtasa  Let them know you had a uti   Watch for blood in urine or burning to urinate

## 2023-02-02 NOTE — Assessment & Plan Note (Signed)
Having more problems due to diarrhea caused by recent keflex for uti  Some constipation chronic as well  H/o both int and ext Today no bleeding Primarily swollen hemorrhoids that itch and hurt externally (per pt, did not examine) Anusol hc has worked in the past  Will px this for 10 d used qhs Update if not starting to improve in a week or if worsening

## 2023-02-02 NOTE — Assessment & Plan Note (Signed)
Sees urology  Just got over klebsiella uti with keflex Lot of leaking at night  Does not think Tammy Manning is working well   Recommend she touch base with urology and get an appt to discuss further

## 2023-02-02 NOTE — Progress Notes (Signed)
Subjective:    Patient ID: Tammy Manning, female    DOB: 18-Jun-1935, 88 y.o.   MRN: [id number]  HPI Pt presents for c/o ST Due for B12 shot  Recent uti  Urinary incontinence  Hemorrhoids - worse with IBS  Wt Readings from Last 3 Encounters:  02/02/23 182 lb 2 oz (82.6 kg)  01/26/23 180 lb (81.6 kg)  12/30/22 178 lb (80.7 kg)   31.26 kg/m  Vitals:   02/02/23 1010  BP: 128/64  Pulse: 73  Temp: 98 F (36.7 C)  SpO2: 99%    Was recently on abx for uti (klebsiella) took keflex  Throat was sore while taking this   Sore throat during that course of medicine  Is a little better now  [place] - severe at times  No coating on mouth or tongue  No phlegm  ? If throat was red Did not run a fever  No cough or cold symptoms   No strep exp   She does take mtx for RA   Results for orders placed or performed in visit on 02/02/23  POCT rapid strep A  Result Value Ref Range   Rapid Strep A Screen Positive (A) Negative      H/o dry mouth H/o thyroid nodule  Has RA  Had some loose stool with keflex  At other time constipated (has linzess)  Hemorrhoids  Internal and external  No bleeding however  Can feel when she wipes  Hurt/itch   Tried [id number]  No help   Uti is better Urinary incontinence is bad Worse at night  Takes gemtasa    Lab Results  Component Value Date   ALT 11 12/30/2022   AST 11 12/30/2022   ALKPHOS 39 04/09/2022   BILITOT 0.4 12/30/2022   Lab Results  Component Value Date   CREATININE 0.77 12/30/2022   BUN 20 12/30/2022   NA 139 12/30/2022   K 5.2 12/30/2022   CL 103 12/30/2022   CO2 28 12/30/2022   Wanted to let me know about dermed cream Otc Helps the sensitivity for thin skin   Patient Active Problem List   Diagnosis Date Noted   Hemorrhoids 02/02/2023   Urinary incontinence 02/02/2023   Acute cystitis 01/26/2023   Dilated aortic root ([place]) 01/26/2023   Osteopenia 01/26/2023   Strep throat 08/28/2022   Multinodular  goiter 02/27/2022   Encounter for screening mammogram for breast cancer 01/23/2022   Recurrent UTI 11/25/2021   High risk medication use 10/07/2021   Atrial fibrillation with RVR ([place]) 04/28/2021   Hypertrophic toenail 04/22/2021   Seropositive rheumatoid arthritis ([place]) 11/25/2020   Bilateral hand pain 11/25/2020   Bilateral shoulder pain 10/16/2020   Joint pain 10/16/2020   Estrogen deficiency 04/30/2020   Dry mouth 03/15/2020   Osteoarthritis of right hip 01/02/2020   Right thyroid nodule 05/12/2018   History of TIA (transient ischemic attack) 05/09/2018   Aortic insufficiency 09/10/2015   Stress reaction 09/08/2015   Encounter for Medicare annual wellness exam 02/28/2013   Bucket-handle tear of lateral meniscus of left knee as current injury 12/30/2012   Episodic atrial fibrillation ([place]) 11/17/2012   Hearing loss of both ears 01/14/2012   B12 deficiency 03/17/2010   GERD (gastroesophageal reflux disease) 03/17/2010   POSTMENOPAUSAL STATUS 08/20/2008   Essential hypertension 07/19/2007   Hyperlipidemia 06/24/2007   IBS 06/24/2007   DERMATITIS, ATOPIC 06/24/2007   SKIN CANCER, HX OF 06/24/2007   Past Medical History:  Diagnosis Date   A-fib ([place])  Anginal pain ([place])    Aortic insufficiency    a. 02/2020 Echo: EF 60-65%, no rwma, Gr1 DD, nl RV fxn, mildly dil LA, triv AI, Asc Ao 69m; b. 04/2021 Echo: EF 60-65%, no rwma, GrI DD, nl RV fxn, mildly dil LA, AI not visualized. Mild-mod AoV sclerosis w/o stenosis.   Arthritis    Knees   B12 deficiency    Mild   Bucket-handle tear of lateral meniscus of left knee as current injury 12/30/2012   Cancer ([place]    Skin, basal cell on nose and eyelid   DJD (degenerative joint disease)    Low back   Dyspepsia    GERD (gastroesophageal reflux disease)    Heart murmur    Heart palpitations    History of cardiac cath    a. 02/2017 Cath: Nl cors. Nl LV fxn. Abd Aogram w/o RAS.   HOH (hard of hearing)    Hyperlipidemia     Hypertension    IBS (irritable bowel syndrome)    Lactose intolerance    Mixed incontinence    PONV (postoperative nausea and vomiting)    Vulvar irritation    from urine pad, uses Triamcinolone oint.   Past Surgical History:  Procedure Laterality Date   ABDOMINAL AORTOGRAM N/A 03/15/2017   Procedure: Abdominal Aortogram;  Surgeon: MWellington Hampshire MD;  [facility];  Service: Cardiovascular;  Laterality: N/A;   ABDOMINAL HYSTERECTOMY  1982   Large fibroids and bleeding   APPENDECTOMY     BILATERAL SALPINGOOPHORECTOMY  1986   BONE GRAFT HIP ILIAC CREST     To Left arm   BREAST REDUCTION SURGERY     BREAST SURGERY     CARDIAC CATHETERIZATION     CATARACT EXTRACTION W/PHACO Left 07/07/2016   Procedure: CATARACT EXTRACTION PHACO AND INTRAOCULAR LENS PLACEMENT (IOC);  Surgeon: WBirder Robson MD;  Location: [facility];  Service: Ophthalmology;  Laterality: Left;  [place]:10 AP% 18.3 CDE 12.91 Fluid pak lot # [id number]   CATARACT EXTRACTION W/PHACO Right 07/28/2016   Procedure: CATARACT EXTRACTION PHACO AND INTRAOCULAR LENS PLACEMENT (IOC);  Surgeon: WBirder Robson MD;  Location: [facility];  Service: Ophthalmology;  Laterality: Right;  [place]:26 AP% 18.5 CDE 16.12 Fluid pack lot # [id number]   COLONOSCOPY  07/2016   Dr. MThana Farr  DILATION AND CURETTAGE OF UTERUS     miscarragesx2   ESOPHAGOGASTRODUODENOSCOPY     Polyps   HEMORRHOID SURGERY  2006   KNEE ARTHROSCOPY WITH LATERAL MENISECTOMY Left 12/30/2012   Procedure: KNEE ARTHROSCOPY WITH LATERAL MENISECTOMY;  Surgeon: JJohnny Bridge MD;  [facility]  Service: Orthopedics;  Laterality: Left;  LEFT KNEE SCOPE LATERAL MENISCECTOMY   LEFT HEART CATH AND CORONARY ANGIOGRAPHY N/A 03/15/2017   Procedure: Left Heart Cath and Coronary Angiography;  Surgeon: MWellington Hampshire MD;  [facility];  Service: Cardiovascular;  Laterality: N/A;   MOUTH SURGERY     skin cancer eyelid  2012    TONSILLECTOMY     Social History   Tobacco Use   Smoking status: Former    Packs/day: 1.00    Years: 12.00    Total pack years: 12.00    Types: Cigarettes    Quit date: 11/24/1963    Years since quitting: 59.2   Smokeless tobacco: Never  Vaping Use   Vaping Use: Never used  Substance Use Topics   Alcohol use: No    Alcohol/week: 0.0 standard drinks of alcohol  Drug use: No   Family History  Problem Relation Age of Onset   Cancer Mother        Colon   Heart disease Father        CAD   Diabetes Sister    Hypothyroidism Sister    Diabetes Brother    Leukemia Brother    Esophageal cancer Brother    Cancer Paternal Aunt        Breast   Diabetes Sister    Alzheimer's disease Sister    Diabetes Brother    Breast cancer Daughter    Allergies  Allergen Reactions   Antihistamines, Chlorpheniramine-Type Other (See Comments)    Reaction:  Makes pt hyper    Atenolol Hypertension   Cefuroxime Axetil Other (See Comments)    Upset stomach   Ciprofloxacin Other (See Comments)    Upset stomach   Co Q 10 [Coenzyme Q10] Other (See Comments)    Reaction:  GI upset    Crestor [Rosuvastatin Calcium] Other (See Comments)    Reaction:  Myalgias    Dextromethorphan-Guaifenesin Other (See Comments)    Reaction:  Made pt jittery    Epinephrine Hives   Ramipril Other (See Comments)    Upset stomach   Sulfamethoxazole-Trimethoprim Other (See Comments)    Upset stomach   Vitamin D Analogs Other (See Comments)    Reaction:  GI upset    Current Outpatient Medications on File Prior to Visit  Medication Sig Dispense Refill   acetaminophen (TYLENOL) 325 MG suppository Place 325 mg rectally every 8 (eight) hours as needed.     amitriptyline (ELAVIL) 10 MG tablet Take 1 tablet (10 mg total) by mouth at bedtime. 90 tablet 3   apixaban (ELIQUIS) 5 MG TABS tablet Take 1 tablet (5 mg total) by mouth 2 (two) times daily. 180 tablet 1   atorvastatin (LIPITOR) 10 MG tablet Take 1 tablet (10 mg  total) by mouth daily. 90 tablet 3   Calcium Carbonate-Vitamin D (CALCIUM 600+D PO) Take 1 tablet by mouth daily.      clobetasol ointment (TEMOVATE) [id number] % Apply 1 application topically 2 (two) times daily as needed.     clorazepate (TRANXENE) 7.5 MG tablet Take 1 tablet (7.5 mg total) by mouth daily as needed. 30 tablet 0   cyanocobalamin (,VITAMIN B-12,) 1000 MCG/ML injection Inject 1,000 mcg into the muscle every 30 (thirty) days.      estradiol (ESTRACE) 0.1 MG/GM vaginal cream Apply a pea-sized amount to fingertip and wipe in vaginal introitus twice weekly 42.5 g 1   famotidine (PEPCID) 20 MG tablet Take 20 mg by mouth 2 (two) times daily as needed for heartburn or indigestion.     guaiFENesin (MUCINEX) 600 MG 12 hr tablet Take 600 mg by mouth 2 (two) times daily as needed.     irbesartan (AVAPRO) 300 MG tablet TAKE 1 TABLET BY MOUTH ONCE DAILY 90 tablet 2   loperamide (IMODIUM) 2 MG capsule Take 2 mg by mouth as needed for diarrhea or loose stools.     MAGNESIUM LACTATE PO Take by mouth 2 (two) times daily.     methotrexate 50 MG/2ML injection Inject 0.6 mLs (15 mg total) into the skin once a week. 4 mL 2   nystatin (MYCOSTATIN) 100000 UNIT/ML suspension Take 5 mLs (500,000 Units total) by mouth 4 (four) times daily. For thrush 120 mL 0   raNITIdine HCl (ZANTAC PO) Take by mouth.     triamcinolone (NASACORT) 55 MCG/ACT AERO nasal inhaler  Place 2 sprays into the nose daily. 1 each 12   TUBERCULIN SYR 1CC/26GX3/8" 26G X 3/8" 1 ML MISC For injection of subcutaneous methotrexate once weekly 25 each 0   Tuberculin-Allergy Syringes 27G X 1/2" 1 ML MISC Use 1 syringe once weekly to inject methotrexate. 12 each 3   Vibegron (GEMTESA) 75 MG TABS Take 75 mg by mouth daily. 30 tablet 11   diltiazem (CARDIZEM CD) 180 MG 24 hr capsule Take 1 capsule (180 mg total) by mouth daily. 90 capsule 3   No current facility-administered medications on file prior to visit.    Review of Systems  Constitutional:   Positive for fatigue. Negative for activity change, appetite change, fever and unexpected weight change.  HENT:  Positive for sore throat. Negative for congestion, ear pain, rhinorrhea, sinus pressure, trouble swallowing and voice change.        Lymph nodes in neck felt swollen last week  Eyes:  Negative for pain, redness and visual disturbance.  Respiratory:  Negative for cough, shortness of breath and wheezing.   Cardiovascular:  Negative for chest pain and palpitations.  Gastrointestinal:  Negative for abdominal pain, blood in stool, constipation and diarrhea.  Endocrine: Negative for polydipsia and polyuria.  Genitourinary:  Positive for frequency and urgency. Negative for dysuria.  Musculoskeletal:  Positive for arthralgias. Negative for back pain and myalgias.  Skin:  Negative for pallor and rash.  Allergic/Immunologic: Negative for environmental allergies.  Neurological:  Negative for dizziness, syncope and headaches.  Hematological:  Negative for adenopathy. Does not bruise/bleed easily.  Psychiatric/Behavioral:  Negative for decreased concentration and dysphoric mood. The patient is not nervous/anxious.        Objective:   Physical Exam Constitutional:      General: She is not in acute distress.    Appearance: She is well-developed. She is obese. She is not ill-appearing or diaphoretic.  HENT:     Head: Normocephalic and atraumatic.     Comments: Hearing aides    Mouth/Throat:     Mouth: Mucous membranes are moist.     Pharynx: Posterior oropharyngeal erythema present. No oropharyngeal exudate.     Comments: Mild post erythema No swelling No exudate No thrush Eyes:     General: No scleral icterus.       Right eye: No discharge.        Left eye: No discharge.     Conjunctiva/sclera: Conjunctivae normal.     Pupils: Pupils are equal, round, and reactive to light.  Neck:     Thyroid: No thyromegaly.     Vascular: No carotid bruit or JVD.  Cardiovascular:     Rate and  Rhythm: Normal rate and regular rhythm.     Heart sounds: Normal heart sounds.     No gallop.  Pulmonary:     Effort: Pulmonary effort is normal. No respiratory distress.     Breath sounds: Normal breath sounds. No stridor. No wheezing, rhonchi or rales.  Chest:     Chest wall: No tenderness.  Abdominal:     General: There is no distension or abdominal bruit.     Palpations: Abdomen is soft. There is no mass.     Tenderness: There is no abdominal tenderness. There is no right CVA tenderness, left CVA tenderness, guarding or rebound.     Comments: No suprapubic tenderness or fullness    Musculoskeletal:     Cervical back: Normal range of motion and neck supple. No tenderness.     Right  lower leg: No edema.     Left lower leg: No edema.  Lymphadenopathy:     Cervical: No cervical adenopathy.  Skin:    General: Skin is warm and dry.     Coloration: Skin is not pale.     Findings: No rash.  Neurological:     Mental Status: She is alert.     Cranial Nerves: No cranial nerve deficit.     Coordination: Coordination normal.     Deep Tendon Reflexes: Reflexes are normal and symmetric. Reflexes normal.  Psychiatric:        Mood and Affect: Mood normal.           Assessment & Plan:   Problem List Items Addressed This Visit       Cardiovascular and Mediastinum   Hemorrhoids    Having more problems due to diarrhea caused by recent keflex for uti  Some constipation chronic as well  H/o both int and ext Today no bleeding Primarily swollen hemorrhoids that itch and hurt externally (per pt, did not examine) Anusol hc has worked in the past  Will px this for 10 d used qhs Update if not starting to improve in a week or if worsening          Respiratory   Strep throat - Primary    ST for a week  Pt is on mtx for RA  Strep test pos Had strep in fall also  Px amox (she tolerates well in the past) 1g daily 10 d Update if not starting to improve in a week or if worsening   Watch for rash or  trouble swallowing Disc symptom care Disc ER precautions         Digestive   IBS    Some constipation  Has linzess from GI        Genitourinary   Acute cystitis    Symptoms improved with keflex from klebsiella uti  Still a lot of incontinence and plans f/u with urology (takes gemtasa)        Other   B12 deficiency    B12 shot today      Urinary incontinence    Sees urology  Just got over klebsiella uti with keflex Lot of leaking at night  Does not think [place] is working well   Recommend she touch base with urology and get an appt to discuss further

## 2023-02-02 NOTE — Assessment & Plan Note (Signed)
ST for a week  Pt is on mtx for RA  Strep test pos Had strep in fall also  Px amox (she tolerates well in the past) 1g daily 10 d Update if not starting to improve in a week or if worsening  Watch for rash or  trouble swallowing Disc symptom care Disc ER precautions

## 2023-02-02 NOTE — Assessment & Plan Note (Signed)
Symptoms improved with keflex from klebsiella uti  Still a lot of incontinence and plans f/u with urology (takes gemtasa)

## 2023-02-09 ENCOUNTER — Ambulatory Visit (INDEPENDENT_AMBULATORY_CARE_PROVIDER_SITE_OTHER): Payer: Medicare Other | Admitting: Physician Assistant

## 2023-02-09 VITALS — BP 140/62 | HR 91 | Ht 64.0 in | Wt 182.0 lb

## 2023-02-09 DIAGNOSIS — R300 Dysuria: Secondary | ICD-10-CM

## 2023-02-09 DIAGNOSIS — R351 Nocturia: Secondary | ICD-10-CM | POA: Diagnosis not present

## 2023-02-09 DIAGNOSIS — R399 Unspecified symptoms and signs involving the genitourinary system: Secondary | ICD-10-CM | POA: Diagnosis not present

## 2023-02-09 DIAGNOSIS — N390 Urinary tract infection, site not specified: Secondary | ICD-10-CM

## 2023-02-09 DIAGNOSIS — R3915 Urgency of urination: Secondary | ICD-10-CM

## 2023-02-09 LAB — URINALYSIS, COMPLETE
Bilirubin, UA: NEGATIVE
Glucose, UA: NEGATIVE
Ketones, UA: NEGATIVE
Nitrite, UA: POSITIVE — AB
Protein,UA: NEGATIVE
Specific Gravity, UA: 1.01 (ref 1.005–1.030)
Urobilinogen, Ur: 0.2 mg/dL (ref 0.2–1.0)
pH, UA: 6 (ref 5.0–7.5)

## 2023-02-09 LAB — MICROSCOPIC EXAMINATION: WBC, UA: >30 /hpf — AB (ref 0–5)

## 2023-02-09 LAB — BLADDER SCAN AMB NON-IMAGING: Scan Result: 29 ml

## 2023-02-09 MED ORDER — MIRABEGRON ER 50 MG PO TB24: 50.0000 mg | ORAL_TABLET | Freq: Every day | ORAL | 0 refills | Status: DC

## 2023-02-09 NOTE — Progress Notes (Signed)
02/09/2023 11:47 AM   [name] 27-Apr-1935 [id number]  CC: Chief Complaint  Patient presents with   Follow-up    Cramping, burning, taking AZO   HPI: Tammy Manning is a 88 y.o. female with PMH recurrent UTI versus is asymptomatic bacteriuria on d-mannose and vaginal estrogen cream, and OAB wet with mixed urge and stress incontinence on Gemtesa who presents today for evaluation of recurrent versus persistent UTI.  She saw her PCP on 01/26/2023 with reports of dysuria, nocturnal enuresis, and hesitancy during the day.  UA nitrite positive and leukocytes.  She was treated with Keflex 500 mg twice daily x 7 days.  Urine culture grew MDR Klebsiella pneumonia, cefazolin susceptibility not reportable.  She has since been diagnosed with strep throat and prescribed amoxicillin 500 mg twice daily x 5 days.  Today she reports her dysuria improved but did not resolve on antibiotics.  She continues to experience a sensation of twisting of the urethra with termination that she states is typical for her with infections.  She has been taking Azo, which helps.  She remains on Gemtesa, which is helping with her OAB.  She has some persistent, bothersome nocturia.  She has been taking Gemtesa in the middle of the day.  In-office UA today positive for 2+ blood, nitrites, and 3+ leukocytes; urine microscopy with >30 WBCs/HPF, 3-10 RBCs/HPF, and many bacteria. PVR 90mL.  PMH: Past Medical History:  Diagnosis Date   A-fib ([place])    Anginal pain ([place])    Aortic insufficiency    a. 02/2020 Echo: EF 60-65%, no rwma, Gr1 DD, nl RV fxn, mildly dil LA, triv AI, Asc Ao 34mm; b. 04/2021 Echo: EF 60-65%, no rwma, GrI DD, nl RV fxn, mildly dil LA, AI not visualized. Mild-mod AoV sclerosis w/o stenosis.   Arthritis    Knees   B12 deficiency    Mild   Bucket-handle tear of lateral meniscus of left knee as current injury 12/30/2012   Cancer ([place])    Skin, basal cell on nose and eyelid   DJD (degenerative  joint disease)    Low back   Dyspepsia    GERD (gastroesophageal reflux disease)    Heart murmur    Heart palpitations    History of cardiac cath    a. 02/2017 Cath: Nl cors. Nl LV fxn. Abd Aogram w/o RAS.   HOH (hard of hearing)    Hyperlipidemia    Hypertension    IBS (irritable bowel syndrome)    Lactose intolerance    Mixed incontinence    PONV (postoperative nausea and vomiting)    Vulvar irritation    from urine pad, uses Triamcinolone oint.    Surgical History: Past Surgical History:  Procedure Laterality Date   ABDOMINAL AORTOGRAM N/A 03/15/2017   Procedure: Abdominal Aortogram;  Surgeon: Wellington Hampshire, MD;  Location: [facility];  Service: Cardiovascular;  Laterality: N/A;   ABDOMINAL HYSTERECTOMY  1982   Large fibroids and bleeding   APPENDECTOMY     BILATERAL SALPINGOOPHORECTOMY  1986   BONE GRAFT HIP ILIAC CREST     To Left arm   BREAST REDUCTION SURGERY     BREAST SURGERY     CARDIAC CATHETERIZATION     CATARACT EXTRACTION W/PHACO Left 07/07/2016   Procedure: CATARACT EXTRACTION PHACO AND INTRAOCULAR LENS PLACEMENT (IOC);  Surgeon: Birder Robson, MD;  Location: [facility];  Service: Ophthalmology;  Laterality: Left;  [place] 01:10 AP% 18.3 CDE 12.91 Fluid pak lot # [id number]  CATARACT EXTRACTION W/PHACO Right 07/28/2016   Procedure: CATARACT EXTRACTION PHACO AND INTRAOCULAR LENS PLACEMENT (IOC);  Surgeon: Birder Robson, MD;  Location: [facility];  Service: Ophthalmology;  Laterality: Right;  [place] 01:26 AP% 18.5 CDE 16.12 Fluid pack lot # [id number]   COLONOSCOPY  07/2016   Dr. Thana Farr   DILATION AND CURETTAGE OF UTERUS     miscarragesx2   ESOPHAGOGASTRODUODENOSCOPY     Polyps   HEMORRHOID SURGERY  2006   KNEE ARTHROSCOPY WITH LATERAL MENISECTOMY Left 12/30/2012   Procedure: KNEE ARTHROSCOPY WITH LATERAL MENISECTOMY;  Surgeon: Johnny Bridge, MD;  Location: [facility];  Service: Orthopedics;  Laterality: Left;  LEFT KNEE SCOPE LATERAL  MENISCECTOMY   LEFT HEART CATH AND CORONARY ANGIOGRAPHY N/A 03/15/2017   Procedure: Left Heart Cath and Coronary Angiography;  Surgeon: Wellington Hampshire, MD;  Location: [facility];  Service: Cardiovascular;  Laterality: N/A;   MOUTH SURGERY     skin cancer eyelid  2012   TONSILLECTOMY      Home Medications:  Allergies as of 02/09/2023       Reactions   Antihistamines, Chlorpheniramine-type Other (See Comments)   Reaction:  Makes pt hyper    Atenolol Hypertension   Cefuroxime Axetil Other (See Comments)   Upset stomach   Ciprofloxacin Other (See Comments)   Upset stomach   Co Q 10 [coenzyme Q10] Other (See Comments)   Reaction:  GI upset    Crestor [rosuvastatin Calcium] Other (See Comments)   Reaction:  Myalgias   Dextromethorphan-guaifenesin Other (See Comments)   Reaction:  Made pt jittery    Epinephrine Hives   Ramipril Other (See Comments)   Upset stomach   Sulfamethoxazole-trimethoprim Other (See Comments)   Upset stomach   Vitamin D Analogs Other (See Comments)   Reaction:  GI upset         Medication List        Accurate as of February 09, 2023 11:47 AM. If you have any questions, ask your nurse or doctor.          acetaminophen 325 MG suppository Commonly known as: TYLENOL Place 325 mg rectally every 8 (eight) hours as needed.   amitriptyline 10 MG tablet Commonly known as: ELAVIL Take 1 tablet (10 mg total) by mouth at bedtime.   amoxicillin 500 MG capsule Commonly known as: AMOXIL Take 2 capsules (1,000 mg total) by mouth daily for 10 days.   apixaban 5 MG Tabs tablet Commonly known as: Eliquis Take 1 tablet (5 mg total) by mouth 2 (two) times daily.   atorvastatin 10 MG tablet Commonly known as: LIPITOR TAKE 1 TABLET(10 MG) BY MOUTH DAILY   CALCIUM 600+D PO Take 1 tablet by mouth daily.   clobetasol ointment 0.05 % Commonly known as: TEMOVATE Apply 1 application topically 2 (two) times daily as needed.   clorazepate 7.5 MG  tablet Commonly known as: TRANXENE Take 1 tablet (7.5 mg total) by mouth daily as needed.   cyanocobalamin 1000 MCG/ML injection Commonly known as: VITAMIN B12 Inject 1,000 mcg into the muscle every 30 (thirty) days.   diltiazem 180 MG 24 hr capsule Commonly known as: CARDIZEM CD Take 1 capsule (180 mg total) by mouth daily.   estradiol 0.1 MG/GM vaginal cream Commonly known as: ESTRACE Apply a pea-sized amount to fingertip and wipe in vaginal introitus twice weekly   famotidine 20 MG tablet Commonly known as: PEPCID Take 20 mg by mouth 2 (two) times daily as needed for  heartburn or indigestion.   Gemtesa 75 MG Tabs Generic drug: Vibegron Take 75 mg by mouth daily.   guaiFENesin 600 MG 12 hr tablet Commonly known as: MUCINEX Take 600 mg by mouth 2 (two) times daily as needed.   hydrocortisone 2.5 % rectal cream Commonly known as: Anusol-HC Apply 1 Application topically at bedtime. To affected area for 10 days   irbesartan 300 MG tablet Commonly known as: AVAPRO TAKE 1 TABLET BY MOUTH DAILY   loperamide 2 MG capsule Commonly known as: IMODIUM Take 2 mg by mouth as needed for diarrhea or loose stools.   MAGNESIUM LACTATE PO Take by mouth 2 (two) times daily.   methotrexate 50 MG/2ML injection Inject 0.6 mLs (15 mg total) into the skin once a week.   nystatin 100000 UNIT/ML suspension Commonly known as: MYCOSTATIN Take 5 mLs (500,000 Units total) by mouth 4 (four) times daily. For thrush   triamcinolone 55 MCG/ACT Aero nasal inhaler Commonly known as: NASACORT Place 2 sprays into the nose daily.   TUBERCULIN SYR 1CC/26GX3/8" 26G X 3/8" 1 ML Misc For injection of subcutaneous methotrexate once weekly   Tuberculin-Allergy Syringes 27G X 1/2" 1 ML Misc Use 1 syringe once weekly to inject methotrexate.   ZANTAC PO Take by mouth.        Allergies:  Allergies  Allergen Reactions   Antihistamines, Chlorpheniramine-Type Other (See Comments)    Reaction:   Makes pt hyper    Atenolol Hypertension   Cefuroxime Axetil Other (See Comments)    Upset stomach   Ciprofloxacin Other (See Comments)    Upset stomach   Co Q 10 [Coenzyme Q10] Other (See Comments)    Reaction:  GI upset    Crestor [Rosuvastatin Calcium] Other (See Comments)    Reaction:  Myalgias    Dextromethorphan-Guaifenesin Other (See Comments)    Reaction:  Made pt jittery    Epinephrine Hives   Ramipril Other (See Comments)    Upset stomach   Sulfamethoxazole-Trimethoprim Other (See Comments)    Upset stomach   Vitamin D Analogs Other (See Comments)    Reaction:  GI upset     Family History: Family History  Problem Relation Age of Onset   Cancer Mother        Colon   Heart disease Father        CAD   Diabetes Sister    Hypothyroidism Sister    Diabetes Brother    Leukemia Brother    Esophageal cancer Brother    Cancer Paternal Aunt        Breast   Diabetes Sister    Alzheimer's disease Sister    Diabetes Brother    Breast cancer Daughter     Social History:   reports that she quit smoking about 59 years ago. Her smoking use included cigarettes. She has a 12.00 pack-year smoking history. She has never used smokeless tobacco. She reports that she does not drink alcohol and does not use drugs.  Physical Exam: BP (!) 140/62   Pulse 91   Ht 5\' 4"  (1.626 m)   Wt 182 lb (82.6 kg)   BMI 31.24 kg/m   Constitutional:  Alert and oriented, no acute distress, nontoxic appearing HEENT: [place], AT Cardiovascular: No clubbing, cyanosis, or edema Respiratory: Normal respiratory effort, no increased work of breathing Skin: No rashes, bruises or suspicious lesions Neurologic: Grossly intact, no focal deficits, moving all 4 extremities Psychiatric: Normal mood and affect  Laboratory Data: Results for orders placed or  performed in visit on 02/09/23  Microscopic Examination   Urine  Result Value Ref Range   WBC, UA >30 (A) 0 - 5 /hpf   RBC, Urine 3-10 (A) 0 - 2 /hpf    Epithelial Cells (non renal) 0-10 0 - 10 /hpf   Bacteria, UA Many (A) None seen/Few  Urinalysis, Complete  Result Value Ref Range   Specific Gravity, UA 1.010 1.005 - 1.030   pH, UA 6.0 5.0 - 7.5   Color, UA Yellow Yellow   Appearance Ur CANCELED    Leukocytes,UA 3+ (A) Negative   Protein,UA Negative Negative/Trace   Glucose, UA Negative Negative   Ketones, UA Negative Negative   RBC, UA 2+ (A) Negative   Bilirubin, UA Negative Negative   Urobilinogen, Ur 0.2 0.2 - 1.0 mg/dL   Nitrite, UA Positive (A) Negative   Microscopic Examination See below:   BLADDER SCAN AMB NON-IMAGING  Result Value Ref Range   Scan Result 29 ml   Assessment & Plan:   1. Recurrent UTI Persistent symptoms, possibly due to culture and appropriate antibiotics.  Will defer treatment today and treat based on urine culture results.  Okay to continue Azo for symptom relief in the interim.  Notably, treating her is challenging due to multiple antibiotics causing her GI upset.  I encouraged her to start a lactobacillus containing probiotic as part of her recurrent UTI regimen. - Urinalysis, Complete - CULTURE, URINE COMPREHENSIVE  2. Urinary urgency PVR WNL.  We discussed switching to Myrbetriq 50 mg as an alternative Gemtesa and I gave her 2 weeks of samples today.  She can reach out to me via MyChart or telephone with her preferred med.  We also discussed adjusting dosing to bedtime to help with her nocturnal symptoms. - BLADDER SCAN AMB NON-IMAGING - mirabegron ER (MYRBETRIQ) 50 MG TB24 tablet; Take 1 tablet (50 mg total) by mouth daily.  Dispense: 14 tablet; Refill: 0   Return for Will call with results.  [name], PA-C  [facility] [address] [place], [place] [address] [phone]

## 2023-02-09 NOTE — Patient Instructions (Addendum)
Please start a Lactobacillus-containing probiotic as part of your UTI prevention regimen. At your convenience, switch from the daily Gemtesa to Myrbetriq 50mg . After finishing the Myrbetriq samples, call me or reach out to me via MyChart to let me know which medication you prefer. Also consider taking this before bed to see if it helps more with your nighttime symptoms.

## 2023-02-11 ENCOUNTER — Other Ambulatory Visit: Payer: Self-pay | Admitting: Physician Assistant

## 2023-02-11 ENCOUNTER — Telehealth: Payer: Self-pay | Admitting: Physician Assistant

## 2023-02-11 ENCOUNTER — Ambulatory Visit: Payer: Medicare Other | Admitting: Family Medicine

## 2023-02-11 DIAGNOSIS — N390 Urinary tract infection, site not specified: Secondary | ICD-10-CM

## 2023-02-11 MED ORDER — FOSFOMYCIN TROMETHAMINE 3 G PO PACK: 3.0000 g | PACK | Freq: Once | ORAL | 0 refills | Status: AC

## 2023-02-11 NOTE — Telephone Encounter (Signed)
Pt called office asking about urine culture.  I informed pt it takes 3 days to get results back.  She saw Sam on 3/19.  She said she knows she has a UTI and is only taking AZO for relief.  After getting results back, if she needs an abx,she would like it sent to [facility] on [address].

## 2023-02-11 NOTE — Telephone Encounter (Signed)
Her urine culture is not back yet, however it is preliminarily growing E coli. I've sent in a rx for empiric fosfomycin 3g x1 dose, which should cover it. Please counsel her on GoodRx coupon to keep cost low.

## 2023-02-12 LAB — CULTURE, URINE COMPREHENSIVE

## 2023-02-12 NOTE — Telephone Encounter (Signed)
Spoke with patient and advised results. She will start on the fosfomycin ASAP

## 2023-02-16 ENCOUNTER — Ambulatory Visit (INDEPENDENT_AMBULATORY_CARE_PROVIDER_SITE_OTHER): Payer: Medicare Other | Admitting: Family Medicine

## 2023-02-16 ENCOUNTER — Encounter: Payer: Self-pay | Admitting: Family Medicine

## 2023-02-16 VITALS — BP 130/80 | HR 73 | Temp 97.7°F | Ht 64.0 in | Wt 178.5 lb

## 2023-02-16 DIAGNOSIS — L304 Erythema intertrigo: Secondary | ICD-10-CM | POA: Diagnosis not present

## 2023-02-16 MED ORDER — KETOCONAZOLE 2 % EX CREA: 1.0000 | TOPICAL_CREAM | Freq: Every day | CUTANEOUS | 0 refills | Status: DC

## 2023-02-16 NOTE — Progress Notes (Signed)
Subjective:    Patient ID: Tammy Manning, female    DOB: 11/13/1935, 88 y.o.   MRN: [id number]  HPI Pt presents with c/o skin irritation   Wt Readings from Last 3 Encounters:  02/16/23 178 lb 8 oz (81 kg)  02/09/23 182 lb (82.6 kg)  02/02/23 182 lb 2 oz (82.6 kg)   30.64 kg/m  Vitals:   02/16/23 0942  BP: 130/80  Pulse: 73  Temp: 97.7 F (36.5 C)  SpO2: 98%   Has to wear adult undergarment with pads due to incontinence  Staying raw under abdomen Skin is very irritated     Otc: Used a powder for moisture-does not remember what type Tried antibiotic ointment   Soap -dove    Of note took amox for strep throat mid month  Also had keflex for uti mid month Then a dose of fosfomycin   Takes methotrexate for RA  Patient Active Problem List   Diagnosis Date Noted   Intertrigo 02/16/2023   Hemorrhoids 02/02/2023   Urinary incontinence 02/02/2023   Acute cystitis 01/26/2023   Dilated aortic root ([place]) 01/26/2023   Osteopenia 01/26/2023   Strep throat 08/28/2022   Multinodular goiter 02/27/2022   Encounter for screening mammogram for breast cancer 01/23/2022   Recurrent UTI 11/25/2021   High risk medication use 10/07/2021   Atrial fibrillation with RVR ([place]) 04/28/2021   Hypertrophic toenail 04/22/2021   Seropositive rheumatoid arthritis ([place]) 11/25/2020   Bilateral hand pain 11/25/2020   Bilateral shoulder pain 10/16/2020   Joint pain 10/16/2020   Estrogen deficiency 04/30/2020   Dry mouth 03/15/2020   Osteoarthritis of right hip 01/02/2020   Right thyroid nodule 05/12/2018   History of TIA (transient ischemic attack) 05/09/2018   Aortic insufficiency 09/10/2015   Stress reaction 09/08/2015   Encounter for Medicare annual wellness exam 02/28/2013   Bucket-handle tear of lateral meniscus of left knee as current injury 12/30/2012   Episodic atrial fibrillation ([place]) 11/17/2012   Hearing loss of both ears 01/14/2012   B12 deficiency 03/17/2010   GERD  (gastroesophageal reflux disease) 03/17/2010   POSTMENOPAUSAL STATUS 08/20/2008   Essential hypertension 07/19/2007   Hyperlipidemia 06/24/2007   IBS 06/24/2007   DERMATITIS, ATOPIC 06/24/2007   SKIN CANCER, HX OF 06/24/2007   Past Medical History:  Diagnosis Date   A-fib ([place])    Anginal pain ([place])    Aortic insufficiency    a. 02/2020 Echo: EF 60-65%, no rwma, Gr1 DD, nl RV fxn, mildly dil LA, triv AI, Asc Ao 53mm; b. 04/2021 Echo: EF 60-65%, no rwma, GrI DD, nl RV fxn, mildly dil LA, AI not visualized. Mild-mod AoV sclerosis w/o stenosis.   Arthritis    Knees   B12 deficiency    Mild   Bucket-handle tear of lateral meniscus of left knee as current injury 12/30/2012   Cancer ([place])    Skin, basal cell on nose and eyelid   DJD (degenerative joint disease)    Low back   Dyspepsia    GERD (gastroesophageal reflux disease)    Heart murmur    Heart palpitations    History of cardiac cath    a. 02/2017 Cath: Nl cors. Nl LV fxn. Abd Aogram w/o RAS.   HOH (hard of hearing)    Hyperlipidemia    Hypertension    IBS (irritable bowel syndrome)    Lactose intolerance    Mixed incontinence    PONV (postoperative nausea and vomiting)    Vulvar irritation  from urine pad, uses Triamcinolone oint.   Past Surgical History:  Procedure Laterality Date   ABDOMINAL AORTOGRAM N/A 03/15/2017   Procedure: Abdominal Aortogram;  Surgeon: Wellington Hampshire, MD;  Location: [facility];  Service: Cardiovascular;  Laterality: N/A;   ABDOMINAL HYSTERECTOMY  1982   Large fibroids and bleeding   APPENDECTOMY     BILATERAL SALPINGOOPHORECTOMY  1986   BONE GRAFT HIP ILIAC CREST     To Left arm   BREAST REDUCTION SURGERY     BREAST SURGERY     CARDIAC CATHETERIZATION     CATARACT EXTRACTION W/PHACO Left 07/07/2016   Procedure: CATARACT EXTRACTION PHACO AND INTRAOCULAR LENS PLACEMENT (IOC);  Surgeon: Birder Robson, MD;  Location: [facility];  Service: Ophthalmology;  Laterality: Left;  [place]  01:10 AP% 18.3 CDE 12.91 Fluid pak lot # [id number]   CATARACT EXTRACTION W/PHACO Right 07/28/2016   Procedure: CATARACT EXTRACTION PHACO AND INTRAOCULAR LENS PLACEMENT (IOC);  Surgeon: Birder Robson, MD;  Location: [facility];  Service: Ophthalmology;  Laterality: Right;  [place] 01:26 AP% 18.5 CDE 16.12 Fluid pack lot # [id number]   COLONOSCOPY  07/2016   Dr. Thana Farr   DILATION AND CURETTAGE OF UTERUS     miscarragesx2   ESOPHAGOGASTRODUODENOSCOPY     Polyps   HEMORRHOID SURGERY  2006   KNEE ARTHROSCOPY WITH LATERAL MENISECTOMY Left 12/30/2012   Procedure: KNEE ARTHROSCOPY WITH LATERAL MENISECTOMY;  Surgeon: Johnny Bridge, MD;  Location: [facility];  Service: Orthopedics;  Laterality: Left;  LEFT KNEE SCOPE LATERAL MENISCECTOMY   LEFT HEART CATH AND CORONARY ANGIOGRAPHY N/A 03/15/2017   Procedure: Left Heart Cath and Coronary Angiography;  Surgeon: Wellington Hampshire, MD;  Location: [facility];  Service: Cardiovascular;  Laterality: N/A;   MOUTH SURGERY     skin cancer eyelid  2012   TONSILLECTOMY     Social History   Tobacco Use   Smoking status: Former    Packs/day: 1.00    Years: 12.00    Additional pack years: 0.00    Total pack years: 12.00    Types: Cigarettes    Quit date: 11/24/1963    Years since quitting: 59.2   Smokeless tobacco: Never  Vaping Use   Vaping Use: Never used  Substance Use Topics   Alcohol use: No    Alcohol/week: 0.0 standard drinks of alcohol   Drug use: No   Family History  Problem Relation Age of Onset   Cancer Mother        Colon   Heart disease Father        CAD   Diabetes Sister    Hypothyroidism Sister    Diabetes Brother    Leukemia Brother    Esophageal cancer Brother    Cancer Paternal Aunt        Breast   Diabetes Sister    Alzheimer's disease Sister    Diabetes Brother    Breast cancer Daughter    Allergies  Allergen Reactions   Antihistamines, Chlorpheniramine-Type Other (See Comments)    Reaction:   Makes pt hyper    Atenolol Hypertension   Cefuroxime Axetil Other (See Comments)    Upset stomach   Ciprofloxacin Other (See Comments)    Upset stomach   Co Q 10 [Coenzyme Q10] Other (See Comments)    Reaction:  GI upset    Crestor [Rosuvastatin Calcium] Other (See Comments)    Reaction:  Myalgias    Dextromethorphan-Guaifenesin Other (See Comments)  Reaction:  Made pt jittery    Epinephrine Hives   Ramipril Other (See Comments)    Upset stomach   Sulfamethoxazole-Trimethoprim Other (See Comments)    Upset stomach   Vitamin D Analogs Other (See Comments)    Reaction:  GI upset    Current Outpatient Medications on File Prior to Visit  Medication Sig Dispense Refill   acetaminophen (TYLENOL) 325 MG suppository Place 325 mg rectally every 8 (eight) hours as needed.     amitriptyline (ELAVIL) 10 MG tablet Take 1 tablet (10 mg total) by mouth at bedtime. 90 tablet 3   apixaban (ELIQUIS) 5 MG TABS tablet Take 1 tablet (5 mg total) by mouth 2 (two) times daily. 180 tablet 1   atorvastatin (LIPITOR) 10 MG tablet TAKE 1 TABLET(10 MG) BY MOUTH DAILY 90 tablet 2   Calcium Carbonate-Vitamin D (CALCIUM 600+D PO) Take 1 tablet by mouth daily.      clobetasol ointment (TEMOVATE) [id number] % Apply 1 application topically 2 (two) times daily as needed.     clorazepate (TRANXENE) 7.5 MG tablet Take 1 tablet (7.5 mg total) by mouth daily as needed. 30 tablet 0   cyanocobalamin (,VITAMIN B-12,) 1000 MCG/ML injection Inject 1,000 mcg into the muscle every 30 (thirty) days.      estradiol (ESTRACE) 0.1 MG/GM vaginal cream Apply a pea-sized amount to fingertip and wipe in vaginal introitus twice weekly 42.5 g 1   famotidine (PEPCID) 20 MG tablet Take 20 mg by mouth 2 (two) times daily as needed for heartburn or indigestion.     guaiFENesin (MUCINEX) 600 MG 12 hr tablet Take 600 mg by mouth 2 (two) times daily as needed.     hydrocortisone (ANUSOL-HC) 2.5 % rectal cream Apply 1 Application topically at  bedtime. To affected area for 10 days 30 g 0   irbesartan (AVAPRO) 300 MG tablet TAKE 1 TABLET BY MOUTH DAILY 90 tablet 2   loperamide (IMODIUM) 2 MG capsule Take 2 mg by mouth as needed for diarrhea or loose stools.     MAGNESIUM LACTATE PO Take by mouth 2 (two) times daily.     methotrexate 50 MG/2ML injection Inject 0.6 mLs (15 mg total) into the skin once a week. 4 mL 2   mirabegron ER (MYRBETRIQ) 50 MG TB24 tablet Take 1 tablet (50 mg total) by mouth daily. 14 tablet 0   raNITIdine HCl (ZANTAC PO) Take by mouth.     triamcinolone (NASACORT) 55 MCG/ACT AERO nasal inhaler Place 2 sprays into the nose daily. 1 each 12   TUBERCULIN SYR 1CC/26GX3/8" 26G X 3/8" 1 ML MISC For injection of subcutaneous methotrexate once weekly 25 each 0   Tuberculin-Allergy Syringes 27G X 1/2" 1 ML MISC Use 1 syringe once weekly to inject methotrexate. 12 each 3   Vibegron (GEMTESA) 75 MG TABS Take 75 mg by mouth daily. 30 tablet 11   diltiazem (CARDIZEM CD) 180 MG 24 hr capsule Take 1 capsule (180 mg total) by mouth daily. 90 capsule 3   No current facility-administered medications on file prior to visit.    Review of Systems  Constitutional:  Negative for activity change, appetite change, fatigue, fever and unexpected weight change.  HENT:  Negative for congestion, ear pain, rhinorrhea, sinus pressure and sore throat.   Eyes:  Negative for pain, redness and visual disturbance.  Respiratory:  Negative for cough, shortness of breath and wheezing.   Cardiovascular:  Negative for chest pain and palpitations.  Gastrointestinal:  Negative for  abdominal pain, blood in stool, constipation and diarrhea.  Endocrine: Negative for polydipsia and polyuria.  Genitourinary:  Negative for dysuria, frequency and urgency.       Uti symptoms are better  Musculoskeletal:  Negative for arthralgias, back pain and myalgias.  Skin:  Positive for rash and wound. Negative for pallor.  Allergic/Immunologic: Negative for environmental  allergies.  Neurological:  Negative for dizziness, syncope and headaches.  Hematological:  Negative for adenopathy. Does not bruise/bleed easily.  Psychiatric/Behavioral:  Negative for decreased concentration and dysphoric mood. The patient is not nervous/anxious.        Objective:   Physical Exam Constitutional:      General: She is not in acute distress.    Appearance: Normal appearance. She is obese. She is not ill-appearing or diaphoretic.  Eyes:     General: No scleral icterus.       Right eye: No discharge.        Left eye: No discharge.     Pupils: Pupils are equal, round, and reactive to light.  Cardiovascular:     Rate and Rhythm: Normal rate and regular rhythm.  Pulmonary:     Effort: Pulmonary effort is normal. No respiratory distress.  Skin:    General: Skin is warm and dry.     Findings: Erythema present.     Comments: Erythema under pannus with few satellite lesions  2 small (1 cm) denuded areas in crease , oval in shape  No drainage or pus  No odor No swelling Only tender over open areas  No other areas of skin change    Neurological:     Mental Status: She is alert.  Psychiatric:        Mood and Affect: Mood normal.           Assessment & Plan:   Problem List Items Addressed This Visit       Musculoskeletal and Integument   Intertrigo - Primary    Under pannus This became worse after 3 rounds of abx for stubborn uti and sinus infx Also takes methotrexate Suspect yeast/candida 2 small denuded areas   Inst to keep clean and very dry -see AVS for inst Ketoconazole cream 2% daily  Non stick pad if needed Once healed-abs powder as needed  Wt loss may help in future Update if not starting to improve in a week or if worsening

## 2023-02-16 NOTE — Assessment & Plan Note (Addendum)
Under pannus This became worse after 3 rounds of abx for stubborn uti and sinus infx Also takes methotrexate Suspect yeast/candida 2 small denuded areas   Inst to keep clean and very dry -see AVS for inst Ketoconazole cream 2% daily  Non stick pad if needed Once healed-abs powder as needed  Wt loss may help in future Update if not starting to improve in a week or if worsening

## 2023-02-16 NOTE — Patient Instructions (Signed)
Keep the affected area very clean (soap and water)  Dry very very well after bathing (use a hair dryer on the cool setting) Then apply the ketoconazole cream once daily Non sticks pads are fine if needed   Once this is healed - go back to an absorbent powder  Corn starch and anti fungal powders are ok   Update if not starting to improve in a week or if worsening

## 2023-02-18 ENCOUNTER — Encounter: Payer: Self-pay | Admitting: Family

## 2023-02-18 ENCOUNTER — Encounter: Payer: Self-pay | Admitting: Family Medicine

## 2023-02-18 ENCOUNTER — Other Ambulatory Visit (HOSPITAL_COMMUNITY)
Admission: RE | Admit: 2023-02-18 | Discharge: 2023-02-18 | Disposition: A | Discharge status: Home / Self Care | Payer: Medicare Other | Source: Ambulatory Visit | Attending: Family | Admitting: Family

## 2023-02-18 ENCOUNTER — Ambulatory Visit (INDEPENDENT_AMBULATORY_CARE_PROVIDER_SITE_OTHER): Payer: Medicare Other | Admitting: Family

## 2023-02-18 ENCOUNTER — Other Ambulatory Visit: Payer: Self-pay | Admitting: Cardiovascular Disease

## 2023-02-18 VITALS — BP 138/78 | HR 86 | Temp 98.2°F | Ht 64.0 in | Wt 181.4 lb

## 2023-02-18 DIAGNOSIS — N390 Urinary tract infection, site not specified: Secondary | ICD-10-CM

## 2023-02-18 DIAGNOSIS — I4891 Unspecified atrial fibrillation: Secondary | ICD-10-CM

## 2023-02-18 DIAGNOSIS — R300 Dysuria: Secondary | ICD-10-CM

## 2023-02-18 DIAGNOSIS — Z87440 Personal history of urinary (tract) infections: Secondary | ICD-10-CM | POA: Diagnosis not present

## 2023-02-18 DIAGNOSIS — N898 Other specified noninflammatory disorders of vagina: Secondary | ICD-10-CM

## 2023-02-18 DIAGNOSIS — N3001 Acute cystitis with hematuria: Secondary | ICD-10-CM

## 2023-02-18 LAB — POC URINALSYSI DIPSTICK (AUTOMATED)
Bilirubin, UA: 1
Glucose, UA: NEGATIVE
Ketones, UA: 5
Nitrite, UA: POSITIVE
Protein, UA: POSITIVE — AB
Spec Grav, UA: 1.025 (ref 1.010–1.025)
Urobilinogen, UA: 0.2 E.U./dL
pH, UA: 5.5 (ref 5.0–8.0)

## 2023-02-18 MED ORDER — FOSFOMYCIN TROMETHAMINE 3 G PO PACK: 3.0000 g | PACK | Freq: Once | ORAL | 0 refills | Status: AC

## 2023-02-18 NOTE — Assessment & Plan Note (Signed)
Likely similar to urine culture 3/19 with ecoli.  Will treat again with oral fosfomycin as pt with multiple drug allergies.  Will pend urine culture over the weekend, but choose to treat as weekend approaching and pt symptomatic.  Will also order urine for yeast/BV to rule out as vaginal itching.

## 2023-02-18 NOTE — Telephone Encounter (Signed)
Noted. Will address in office today.

## 2023-02-18 NOTE — Telephone Encounter (Signed)
.  left message to have patient return my call.  

## 2023-02-18 NOTE — Patient Instructions (Signed)
It was a pleasure seeing you today.   A referral was placed today for second opinion with a new urologist.  Please let us know if you have not heard back within 2 weeks about the referral.  You were found to have a urinary tract infection, you have been prescribed an antibiotic to your preferred pharmacy. Please start antibiotic today as directed.   We are sending your urine for a culture to make sure you do not have a resistant bacteria. We will call you if we need to change your medications.   Please make sure you are drinking plenty of fluids over the next few days.  If your symptoms do not improve over the next 5-7 days, or if they worsen, please let us know. Please also let us know if you have worsening back pain, fevers, chills, or body aches.   Regards,   Eugenia Pancoast

## 2023-02-18 NOTE — Telephone Encounter (Signed)
Spoke with husband and asked for wife and he gave me her phone number, told me to call her. I advised she is not answering and per husband " well try later".   ( I will also send a mychart message)

## 2023-02-18 NOTE — Progress Notes (Signed)
Established Patient Office Visit  Subjective:   Patient ID: Tammy Manning, female    DOB: Mar 01, 1935  Age: 88 y.o. MRN: [id number]  CC:  Chief Complaint  Patient presents with   Urinary Frequency    HPI: Tammy Manning is a 88 y.o. female presenting on 02/18/2023 for Urinary Frequency   HPI  Pt saw urologist 3/19 for c/o cramping and burning, and was taking azo. Urine abn 3/19 which prompted a urine culture which came back positive for ecoli UTI 3/19 she was treated with fosfomycin with 3 g x one dose with only mild improvement.   Today comes back in for ongoing dysuria, and also urinary urgency with lower suprapubic tenderness. She denies flank pain fever and or chills. She denies seeing blood in urine.   Pt was seeing urologist at Dr. Maryclare Labrador office but states she is not happy with their care and wants to request an change in location with a new referral for recurrent UTI's            ROS: Negative unless specifically indicated above in HPI.   Relevant past medical history reviewed and updated as indicated.   Allergies and medications reviewed and updated.   Current Outpatient Medications:    acetaminophen (TYLENOL) 325 MG suppository, Place 325 mg rectally every 8 (eight) hours as needed., Disp: , Rfl:    amitriptyline (ELAVIL) 10 MG tablet, Take 1 tablet (10 mg total) by mouth at bedtime., Disp: 90 tablet, Rfl: 3   apixaban (ELIQUIS) 5 MG TABS tablet, Take 1 tablet (5 mg total) by mouth 2 (two) times daily., Disp: 180 tablet, Rfl: 1   atorvastatin (LIPITOR) 10 MG tablet, TAKE 1 TABLET(10 MG) BY MOUTH DAILY, Disp: 90 tablet, Rfl: 2   Calcium Carbonate-Vitamin D (CALCIUM 600+D PO), Take 1 tablet by mouth daily. , Disp: , Rfl:    clobetasol ointment (TEMOVATE) AB-123456789 %, Apply 1 application topically 2 (two) times daily as needed., Disp: , Rfl:    clorazepate (TRANXENE) 7.5 MG tablet, Take 1 tablet (7.5 mg total) by mouth daily as needed., Disp: 30 tablet, Rfl: 0    cyanocobalamin (,VITAMIN B-12,) 1000 MCG/ML injection, Inject 1,000 mcg into the muscle every 30 (thirty) days. , Disp: , Rfl:    diltiazem (CARDIZEM CD) 180 MG 24 hr capsule, Take 1 capsule (180 mg total) by mouth daily., Disp: 90 capsule, Rfl: 3   estradiol (ESTRACE) 0.1 MG/GM vaginal cream, Apply a pea-sized amount to fingertip and wipe in vaginal introitus twice weekly, Disp: 42.5 g, Rfl: 1   famotidine (PEPCID) 20 MG tablet, Take 20 mg by mouth 2 (two) times daily as needed for heartburn or indigestion., Disp: , Rfl:    fosfomycin (MONUROL) 3 g PACK, Take 3 g by mouth once for 1 dose., Disp: 3 g, Rfl: 0   guaiFENesin (MUCINEX) 600 MG 12 hr tablet, Take 600 mg by mouth 2 (two) times daily as needed., Disp: , Rfl:    hydrocortisone (ANUSOL-HC) 2.5 % rectal cream, Apply 1 Application topically at bedtime. To affected area for 10 days, Disp: 30 g, Rfl: 0   irbesartan (AVAPRO) 300 MG tablet, TAKE 1 TABLET BY MOUTH DAILY, Disp: 90 tablet, Rfl: 2   ketoconazole (NIZORAL) 2 % cream, Apply 1 Application topically daily. To affected area, Disp: 30 g, Rfl: 0   loperamide (IMODIUM) 2 MG capsule, Take 2 mg by mouth as needed for diarrhea or loose stools., Disp: , Rfl:    MAGNESIUM LACTATE PO, Take by  mouth 2 (two) times daily., Disp: , Rfl:    methotrexate 50 MG/2ML injection, Inject 0.6 mLs (15 mg total) into the skin once a week., Disp: 4 mL, Rfl: 2   mirabegron ER (MYRBETRIQ) 50 MG TB24 tablet, Take 1 tablet (50 mg total) by mouth daily., Disp: 14 tablet, Rfl: 0   raNITIdine HCl (ZANTAC PO), Take by mouth., Disp: , Rfl:    triamcinolone (NASACORT) 55 MCG/ACT AERO nasal inhaler, Place 2 sprays into the nose daily., Disp: 1 each, Rfl: 12   TUBERCULIN SYR 1CC/26GX3/8" 26G X 3/8" 1 ML MISC, For injection of subcutaneous methotrexate once weekly, Disp: 25 each, Rfl: 0   Tuberculin-Allergy Syringes 27G X 1/2" 1 ML MISC, Use 1 syringe once weekly to inject methotrexate., Disp: 12 each, Rfl: 3   Vibegron  (GEMTESA) 75 MG TABS, Take 75 mg by mouth daily., Disp: 30 tablet, Rfl: 11  Allergies  Allergen Reactions   Antihistamines, Chlorpheniramine-Type Other (See Comments)    Reaction:  Makes pt hyper    Atenolol Hypertension   Cefuroxime Axetil Other (See Comments)    Upset stomach   Ciprofloxacin Other (See Comments)    Upset stomach   Co Q 10 [Coenzyme Q10] Other (See Comments)    Reaction:  GI upset    Crestor [Rosuvastatin Calcium] Other (See Comments)    Reaction:  Myalgias    Dextromethorphan-Guaifenesin Other (See Comments)    Reaction:  Made pt jittery    Epinephrine Hives   Ramipril Other (See Comments)    Upset stomach   Sulfamethoxazole-Trimethoprim Other (See Comments)    Upset stomach   Vitamin D Analogs Other (See Comments)    Reaction:  GI upset     Objective:   BP 138/78   Pulse 86   Temp 98.2 F (36.8 C) (Temporal)   Ht 5\' 4"  (1.626 m)   Wt 181 lb 6.4 oz (82.3 kg)   SpO2 97%   BMI 31.14 kg/m    Physical Exam Constitutional:      General: She is not in acute distress.    Appearance: Normal appearance. She is normal weight. She is not ill-appearing, toxic-appearing or diaphoretic.  Cardiovascular:     Rate and Rhythm: Normal rate.  Pulmonary:     Effort: Pulmonary effort is normal.  Abdominal:     General: Abdomen is flat.     Tenderness: There is no abdominal tenderness. There is no right CVA tenderness or left CVA tenderness.  Neurological:     General: No focal deficit present.     Mental Status: She is alert and oriented to person, place, and time. Mental status is at baseline.     Motor: No weakness.  Psychiatric:        Mood and Affect: Mood normal.        Behavior: Behavior normal.        Thought Content: Thought content normal.        Judgment: Judgment normal.     Assessment & Plan:  Dysuria -     Urine cytology ancillary only -     Urine Culture -     POCT Urinalysis Dipstick (Automated) -     Ambulatory referral to  Urology  History of UTI -     Urine Culture -     POCT Urinalysis Dipstick (Automated)  Vaginal itching -     Urine cytology ancillary only  Acute cystitis with hematuria Assessment & Plan: Likely similar to urine culture 3/19 with  ecoli.  Will treat again with oral fosfomycin as pt with multiple drug allergies.  Will pend urine culture over the weekend, but choose to treat as weekend approaching and pt symptomatic.  Will also order urine for yeast/BV to rule out as vaginal itching.    Orders: -     Fosfomycin Tromethamine; Take 3 g by mouth once for 1 dose.  Dispense: 3 g; Refill: 0  Recurrent UTI (urinary tract infection) Assessment & Plan: Advised pt will need to urology for second opinion for workup for recurrent UTI's  Pt agreeable to this and referral for second opinion placed.    Orders: -     Ambulatory referral to Urology -     Fosfomycin Tromethamine; Take 3 g by mouth once for 1 dose.  Dispense: 3 g; Refill: 0     Follow up plan: Return for f/u with primary care provider if no improvement.  Eugenia Pancoast, FNP

## 2023-02-18 NOTE — Telephone Encounter (Signed)
Prescription refill request for Eliquis received. Indication: PAF Last office visit: 09/08/22  Rod Can MD Scr: 0.77 on 12/30/22  Epic Age: 88 Weight: 82.2kg  Based on above findings Eliquis 5mg  twice daily is the appropriate dose.  Refill approved.

## 2023-02-18 NOTE — Assessment & Plan Note (Signed)
Advised pt will need to urology for second opinion for workup for recurrent UTI's  Pt agreeable to this and referral for second opinion placed.

## 2023-02-18 NOTE — Telephone Encounter (Signed)
Sorry for the confusion. I think it was a slip of the finger, pt was requesting [place] location. I had advised her of the mychart message and their attempt to reach her however she really did not want to go back to the [place] location. I hope this helps.

## 2023-02-18 NOTE — Telephone Encounter (Signed)
Dr Glori Bickers,   [facility] has actually tried reaching the patient regarding her Mychart message and symptoms to offer her an appt. They attempted to call her and LM instead of responding on Mychart.   But they did try to reach her to schedule Labs/UA   See Mychart message 3/25  Tammy Loop, PA-C  to Tammy Manning, [facility]  02/18/23 10:05 AM Please offer her a lab visit for repeat UA today, sooner rather than later. Will call with results.   Is the patient wanting to still switch Urology offices? The referral you placed was for a second opinion (patient no longer wants [facility]) but the location you chose was [facility], which is where she is established.

## 2023-02-18 NOTE — Telephone Encounter (Signed)
I spoke with pt and she is having a lot of pain upon urination. Pt said she is trying to void often. No fever, no abd pain and no back pain. Pt scheduled appt with T Dugal FNP 02/18/23 at 11 AM.Sending note to Red Christians FNP and [facility] pool.

## 2023-02-18 NOTE — Telephone Encounter (Signed)
Please review

## 2023-02-19 LAB — URINE CYTOLOGY ANCILLARY ONLY
Bacterial Vaginitis-Urine: NEGATIVE
Candida Urine: POSITIVE — AB
Chlamydia: NEGATIVE
Comment: NEGATIVE
Comment: NORMAL
Neisseria Gonorrhea: NEGATIVE

## 2023-02-22 ENCOUNTER — Other Ambulatory Visit: Payer: Self-pay | Admitting: Nurse Practitioner

## 2023-02-22 DIAGNOSIS — B3731 Acute candidiasis of vulva and vagina: Secondary | ICD-10-CM

## 2023-02-22 DIAGNOSIS — N898 Other specified noninflammatory disorders of vagina: Secondary | ICD-10-CM

## 2023-02-22 LAB — URINE CULTURE
MICRO NUMBER:: 14755092
SPECIMEN QUALITY:: ADEQUATE

## 2023-02-22 MED ORDER — FLUCONAZOLE 150 MG PO TABS: 150.0000 mg | ORAL_TABLET | Freq: Once | ORAL | 0 refills | Status: AC

## 2023-02-22 NOTE — Telephone Encounter (Addendum)
Will route to PCP since Tabitha is out of the office all week

## 2023-02-23 ENCOUNTER — Telehealth: Payer: Self-pay | Admitting: Nurse Practitioner

## 2023-02-23 NOTE — Telephone Encounter (Signed)
If she is taking if for the painful urination she needs to stop to make sure the infection has cleared

## 2023-02-23 NOTE — Telephone Encounter (Signed)
Sent message via Mychart, per the patients request.

## 2023-02-23 NOTE — Telephone Encounter (Signed)
-----   Message from Vaughan Browner, [place] sent at 02/23/2023 10:50 AM EDT ----- Patient called back and is aware of lab results. Patient states she is doing ok so far. She has no cramping, no dysuria, or itching. She also has taken the 1 dose med for yeast infection, which made her dizzy, but that subsided last night. Patient is now taking Azo- 2 tabs in the am and 2 tabs at night. Directions says to take 4 daily, and she wants to know if it was ok to take separate like that or take all 4 tabs at one time. Please advise. Thanks

## 2023-02-24 ENCOUNTER — Encounter: Payer: Self-pay | Admitting: Family Medicine

## 2023-02-25 ENCOUNTER — Encounter: Payer: Self-pay | Admitting: Internal Medicine

## 2023-02-25 ENCOUNTER — Ambulatory Visit (INDEPENDENT_AMBULATORY_CARE_PROVIDER_SITE_OTHER): Payer: Medicare Other | Admitting: Internal Medicine

## 2023-02-25 VITALS — BP 110/70 | HR 91 | Temp 97.5°F | Ht 64.0 in | Wt 179.0 lb

## 2023-02-25 DIAGNOSIS — R350 Frequency of micturition: Secondary | ICD-10-CM

## 2023-02-25 DIAGNOSIS — N390 Urinary tract infection, site not specified: Secondary | ICD-10-CM | POA: Diagnosis not present

## 2023-02-25 LAB — POC URINALSYSI DIPSTICK (AUTOMATED)
Bilirubin, UA: NEGATIVE
Blood, UA: NEGATIVE
Glucose, UA: NEGATIVE
Ketones, UA: NEGATIVE
Nitrite, UA: NEGATIVE
Protein, UA: NEGATIVE
Spec Grav, UA: 1.015 (ref 1.010–1.025)
Urobilinogen, UA: 0.2 E.U./dL
pH, UA: 6 (ref 5.0–8.0)

## 2023-02-25 MED ORDER — NITROFURANTOIN MONOHYD MACRO 100 MG PO CAPS: 100.0000 mg | ORAL_CAPSULE | Freq: Two times a day (BID) | ORAL | 1 refills | Status: DC

## 2023-02-25 NOTE — Patient Instructions (Signed)
Stop the gemtasa and don't use the elavil any more

## 2023-02-25 NOTE — Telephone Encounter (Signed)
See referral - location updated to [facility] in [place]  Pt made aware

## 2023-02-25 NOTE — Assessment & Plan Note (Addendum)
Had E coli last week 3+ leuks still May be recurrent due to apparent urinary retention She doesn't take the amitriptyline regularly--will discontinue. Asked her to stop the gemtasa Will give nitrofurantoin for 3 weeks Did see urologist--she wasn't satisfied----will leave 2nd opinion to PCP

## 2023-02-25 NOTE — Progress Notes (Signed)
Subjective:    Patient ID: Tammy Manning, female    DOB: 08/31/1935, 88 y.o.   MRN: [id number]  HPI Here due to recurrent urinary symptoms  Seems to have recurrent symptoms within a week of stopping antibiotics Treated last week with the fosfomycin Gets cramping and trouble voiding--feels like her bladder is full Some dysuria No visible blood No fever  Seems to only have incontinence with her infections Just can't hold it then  Current Outpatient Medications on File Prior to Visit  Medication Sig Dispense Refill   acetaminophen (TYLENOL) 325 MG suppository Place 325 mg rectally every 8 (eight) hours as needed.     amitriptyline (ELAVIL) 10 MG tablet Take 1 tablet (10 mg total) by mouth at bedtime. 90 tablet 3   atorvastatin (LIPITOR) 10 MG tablet TAKE 1 TABLET(10 MG) BY MOUTH DAILY 90 tablet 2   Calcium Carbonate-Vitamin D (CALCIUM 600+D PO) Take 1 tablet by mouth daily.      clobetasol ointment (TEMOVATE) AB-123456789 % Apply 1 application topically 2 (two) times daily as needed.     clorazepate (TRANXENE) 7.5 MG tablet Take 1 tablet (7.5 mg total) by mouth daily as needed. 30 tablet 0   cyanocobalamin (,VITAMIN B-12,) 1000 MCG/ML injection Inject 1,000 mcg into the muscle every 30 (thirty) days.      ELIQUIS 5 MG TABS tablet TAKE 1 TABLET BY MOUTH TWICE DAILY 180 tablet 1   estradiol (ESTRACE) 0.1 MG/GM vaginal cream Apply a pea-sized amount to fingertip and wipe in vaginal introitus twice weekly 42.5 g 1   famotidine (PEPCID) 20 MG tablet Take 20 mg by mouth 2 (two) times daily as needed for heartburn or indigestion.     guaiFENesin (MUCINEX) 600 MG 12 hr tablet Take 600 mg by mouth 2 (two) times daily as needed.     hydrocortisone (ANUSOL-HC) 2.5 % rectal cream Apply 1 Application topically at bedtime. To affected area for 10 days 30 g 0   irbesartan (AVAPRO) 300 MG tablet TAKE 1 TABLET BY MOUTH DAILY 90 tablet 2   ketoconazole (NIZORAL) 2 % cream Apply 1 Application topically daily.  To affected area 30 g 0   loperamide (IMODIUM) 2 MG capsule Take 2 mg by mouth as needed for diarrhea or loose stools.     MAGNESIUM LACTATE PO Take by mouth 2 (two) times daily.     methotrexate 50 MG/2ML injection Inject 0.6 mLs (15 mg total) into the skin once a week. 4 mL 2   raNITIdine HCl (ZANTAC PO) Take by mouth.     triamcinolone (NASACORT) 55 MCG/ACT AERO nasal inhaler Place 2 sprays into the nose daily. 1 each 12   TUBERCULIN SYR 1CC/26GX3/8" 26G X 3/8" 1 ML MISC For injection of subcutaneous methotrexate once weekly 25 each 0   Tuberculin-Allergy Syringes 27G X 1/2" 1 ML MISC Use 1 syringe once weekly to inject methotrexate. 12 each 3   Vibegron (GEMTESA) 75 MG TABS Take 75 mg by mouth daily. 30 tablet 11   diltiazem (CARDIZEM CD) 180 MG 24 hr capsule Take 1 capsule (180 mg total) by mouth daily. 90 capsule 3   No current facility-administered medications on file prior to visit.    Allergies  Allergen Reactions   Antihistamines, Chlorpheniramine-Type Other (See Comments)    Reaction:  Makes pt hyper    Atenolol Hypertension   Cefuroxime Axetil Other (See Comments)    Upset stomach   Ciprofloxacin Other (See Comments)    Upset stomach  Co Q 10 [Coenzyme Q10] Other (See Comments)    Reaction:  GI upset    Crestor [Rosuvastatin Calcium] Other (See Comments)    Reaction:  Myalgias    Dextromethorphan-Guaifenesin Other (See Comments)    Reaction:  Made pt jittery    Epinephrine Hives   Ramipril Other (See Comments)    Upset stomach   Sulfamethoxazole-Trimethoprim Other (See Comments)    Upset stomach   Vitamin D Analogs Other (See Comments)    Reaction:  GI upset     Past Medical History:  Diagnosis Date   A-fib    Anginal pain    Aortic insufficiency    a. 02/2020 Echo: EF 60-65%, no rwma, Gr1 DD, nl RV fxn, mildly dil LA, triv AI, Asc Ao 72mm; b. 04/2021 Echo: EF 60-65%, no rwma, GrI DD, nl RV fxn, mildly dil LA, AI not visualized. Mild-mod AoV sclerosis w/o  stenosis.   Arthritis    Knees   B12 deficiency    Mild   Bucket-handle tear of lateral meniscus of left knee as current injury 12/30/2012   Cancer    Skin, basal cell on nose and eyelid   DJD (degenerative joint disease)    Low back   Dyspepsia    GERD (gastroesophageal reflux disease)    Heart murmur    Heart palpitations    History of cardiac cath    a. 02/2017 Cath: Nl cors. Nl LV fxn. Abd Aogram w/o RAS.   HOH (hard of hearing)    Hyperlipidemia    Hypertension    IBS (irritable bowel syndrome)    Lactose intolerance    Mixed incontinence    PONV (postoperative nausea and vomiting)    Vulvar irritation    from urine pad, uses Triamcinolone oint.    Past Surgical History:  Procedure Laterality Date   ABDOMINAL AORTOGRAM N/A 03/15/2017   Procedure: Abdominal Aortogram;  Surgeon: Wellington Hampshire, MD;  Location: [facility];  Service: Cardiovascular;  Laterality: N/A;   ABDOMINAL HYSTERECTOMY  1982   Large fibroids and bleeding   APPENDECTOMY     BILATERAL SALPINGOOPHORECTOMY  1986   BONE GRAFT HIP ILIAC CREST     To Left arm   BREAST REDUCTION SURGERY     BREAST SURGERY     CARDIAC CATHETERIZATION     CATARACT EXTRACTION W/PHACO Left 07/07/2016   Procedure: CATARACT EXTRACTION PHACO AND INTRAOCULAR LENS PLACEMENT (IOC);  Surgeon: Birder Robson, MD;  Location: [facility];  Service: Ophthalmology;  Laterality: Left;  [place] 01:10 AP% 18.3 CDE 12.91 Fluid pak lot # [id number]   CATARACT EXTRACTION W/PHACO Right 07/28/2016   Procedure: CATARACT EXTRACTION PHACO AND INTRAOCULAR LENS PLACEMENT (IOC);  Surgeon: Birder Robson, MD;  Location: [facility];  Service: Ophthalmology;  Laterality: Right;  [place] 01:26 AP% 18.5 CDE 16.12 Fluid pack lot # [id number]   COLONOSCOPY  07/2016   Dr. Thana Farr   DILATION AND CURETTAGE OF UTERUS     miscarragesx2   ESOPHAGOGASTRODUODENOSCOPY     Polyps   HEMORRHOID SURGERY  2006   KNEE ARTHROSCOPY WITH LATERAL MENISECTOMY Left 12/30/2012    Procedure: KNEE ARTHROSCOPY WITH LATERAL MENISECTOMY;  Surgeon: Johnny Bridge, MD;  Location: [facility];  Service: Orthopedics;  Laterality: Left;  LEFT KNEE SCOPE LATERAL MENISCECTOMY   LEFT HEART CATH AND CORONARY ANGIOGRAPHY N/A 03/15/2017   Procedure: Left Heart Cath and Coronary Angiography;  Surgeon: Wellington Hampshire, MD;  Location: [facility];  Service:  Cardiovascular;  Laterality: N/A;   MOUTH SURGERY     skin cancer eyelid  2012   TONSILLECTOMY      Family History  Problem Relation Age of Onset   Cancer Mother        Colon   Heart disease Father        CAD   Diabetes Sister    Hypothyroidism Sister    Diabetes Brother    Leukemia Brother    Esophageal cancer Brother    Cancer Paternal Aunt        Breast   Diabetes Sister    Alzheimer's disease Sister    Diabetes Brother    Breast cancer Daughter     Social History   Socioeconomic History   Marital status: Married    Spouse name: Not on file   Number of children: 2   Years of education: Not on file   Highest education level: Not on file  Occupational History   Occupation: Architect: RETIRED  Tobacco Use   Smoking status: Former    Packs/day: 1.00    Years: 12.00    Additional pack years: 0.00    Total pack years: 12.00    Types: Cigarettes    Quit date: 11/24/1963    Years since quitting: 59.2   Smokeless tobacco: Never  Vaping Use   Vaping Use: Never used  Substance and Sexual Activity   Alcohol use: No    Alcohol/week: 0.0 standard drinks of alcohol   Drug use: No   Sexual activity: Yes  Other Topics Concern   Not on file  Social History Narrative   Metallurgist.     Social Determinants of Health   Financial Resource Strain: Low Risk  (12/25/2022)   Overall Financial Resource Strain (CARDIA)    Difficulty of Paying Living Expenses: Not hard at all  Food Insecurity: No Food Insecurity (12/25/2022)   Hunger Vital Sign    Worried About Running Out of  Food in the Last Year: Never true    Ran Out of Food in the Last Year: Never true  Transportation Needs: No Transportation Needs (12/25/2022)   PRAPARE - Hydrologist (Medical): No    Lack of Transportation (Non-Medical): No  Physical Activity: Insufficiently Active (12/25/2022)   Exercise Vital Sign    Days of Exercise per Week: 7 days    Minutes of Exercise per Session: 10 min  Stress: No Stress Concern Present (12/25/2022)   [place]    Feeling of Stress : Not at all  Social Connections: Moderately Isolated (12/25/2022)   Social Connection and Isolation Panel [NHANES]    Frequency of Communication with Friends and Family: More than three times a week    Frequency of Social Gatherings with Friends and Family: More than three times a week    Attends Religious Services: Never    Marine scientist or Organizations: No    Attends Archivist Meetings: Never    Marital Status: Married  Human resources officer Violence: Not At Risk (12/25/2022)   Humiliation, Afraid, Rape, and Kick questionnaire    Fear of Current or Ex-Partner: No    Emotionally Abused: No    Physically Abused: No    Sexually Abused: No   Review of Systems Some nausea at times---AM No vomiting Eating okay--weight down some    Objective:   Physical Exam Constitutional:  Appearance: Normal appearance.  Abdominal:     Palpations: Abdomen is soft.     Tenderness: There is no right CVA tenderness or left CVA tenderness.     Comments: ?suprapubic dullness and mild tenderness  Neurological:     Mental Status: She is alert.            Assessment & Plan:

## 2023-03-12 ENCOUNTER — Encounter: Payer: Self-pay | Admitting: Cardiovascular Disease

## 2023-03-12 ENCOUNTER — Inpatient Hospital Stay
Admission: EM | Admit: 2023-03-12 | Discharge: 2023-03-14 | DRG: 392 | Disposition: A | Discharge status: Home / Self Care | Payer: Medicare Other | Attending: Internal Medicine | Admitting: Internal Medicine

## 2023-03-12 ENCOUNTER — Other Ambulatory Visit: Payer: Self-pay

## 2023-03-12 ENCOUNTER — Ambulatory Visit: Payer: Medicare Other | Attending: Cardiovascular Disease | Admitting: Cardiovascular Disease

## 2023-03-12 VITALS — BP 122/58 | HR 81 | Ht 64.5 in | Wt 180.1 lb

## 2023-03-12 DIAGNOSIS — Z85828 Personal history of other malignant neoplasm of skin: Secondary | ICD-10-CM | POA: Diagnosis not present

## 2023-03-12 DIAGNOSIS — I10 Essential (primary) hypertension: Secondary | ICD-10-CM

## 2023-03-12 DIAGNOSIS — I5032 Chronic diastolic (congestive) heart failure: Secondary | ICD-10-CM | POA: Diagnosis present

## 2023-03-12 DIAGNOSIS — R197 Diarrhea, unspecified: Secondary | ICD-10-CM | POA: Diagnosis not present

## 2023-03-12 DIAGNOSIS — K529 Noninfective gastroenteritis and colitis, unspecified: Principal | ICD-10-CM | POA: Diagnosis present

## 2023-03-12 DIAGNOSIS — K5730 Diverticulosis of large intestine without perforation or abscess without bleeding: Secondary | ICD-10-CM | POA: Diagnosis present

## 2023-03-12 DIAGNOSIS — Z79631 Long term (current) use of antimetabolite agent: Secondary | ICD-10-CM

## 2023-03-12 DIAGNOSIS — Z8249 Family history of ischemic heart disease and other diseases of the circulatory system: Secondary | ICD-10-CM

## 2023-03-12 DIAGNOSIS — Z87440 Personal history of urinary (tract) infections: Secondary | ICD-10-CM

## 2023-03-12 DIAGNOSIS — I493 Ventricular premature depolarization: Secondary | ICD-10-CM | POA: Diagnosis present

## 2023-03-12 DIAGNOSIS — Z66 Do not resuscitate: Secondary | ICD-10-CM | POA: Diagnosis not present

## 2023-03-12 DIAGNOSIS — Z833 Family history of diabetes mellitus: Secondary | ICD-10-CM | POA: Diagnosis not present

## 2023-03-12 DIAGNOSIS — I351 Nonrheumatic aortic (valve) insufficiency: Secondary | ICD-10-CM | POA: Diagnosis present

## 2023-03-12 DIAGNOSIS — E785 Hyperlipidemia, unspecified: Secondary | ICD-10-CM | POA: Insufficient documentation

## 2023-03-12 DIAGNOSIS — Z803 Family history of malignant neoplasm of breast: Secondary | ICD-10-CM

## 2023-03-12 DIAGNOSIS — R1030 Lower abdominal pain, unspecified: Secondary | ICD-10-CM

## 2023-03-12 DIAGNOSIS — Z87891 Personal history of nicotine dependence: Secondary | ICD-10-CM

## 2023-03-12 DIAGNOSIS — I110 Hypertensive heart disease with heart failure: Secondary | ICD-10-CM | POA: Diagnosis not present

## 2023-03-12 DIAGNOSIS — H9190 Unspecified hearing loss, unspecified ear: Secondary | ICD-10-CM | POA: Diagnosis present

## 2023-03-12 DIAGNOSIS — Z6831 Body mass index (BMI) 31.0-31.9, adult: Secondary | ICD-10-CM | POA: Diagnosis not present

## 2023-03-12 DIAGNOSIS — Z882 Allergy status to sulfonamides status: Secondary | ICD-10-CM

## 2023-03-12 DIAGNOSIS — Z820 Family history of epilepsy and other diseases of the nervous system: Secondary | ICD-10-CM

## 2023-03-12 DIAGNOSIS — Z7901 Long term (current) use of anticoagulants: Secondary | ICD-10-CM

## 2023-03-12 DIAGNOSIS — M059 Rheumatoid arthritis with rheumatoid factor, unspecified: Secondary | ICD-10-CM | POA: Diagnosis not present

## 2023-03-12 DIAGNOSIS — Z806 Family history of leukemia: Secondary | ICD-10-CM

## 2023-03-12 DIAGNOSIS — Z800 Family history of malignant neoplasm of digestive organs: Secondary | ICD-10-CM | POA: Diagnosis not present

## 2023-03-12 DIAGNOSIS — I480 Paroxysmal atrial fibrillation: Secondary | ICD-10-CM | POA: Insufficient documentation

## 2023-03-12 DIAGNOSIS — Z881 Allergy status to other antibiotic agents status: Secondary | ICD-10-CM

## 2023-03-12 DIAGNOSIS — M170 Bilateral primary osteoarthritis of knee: Secondary | ICD-10-CM | POA: Diagnosis present

## 2023-03-12 DIAGNOSIS — Z8673 Personal history of transient ischemic attack (TIA), and cerebral infarction without residual deficits: Secondary | ICD-10-CM

## 2023-03-12 DIAGNOSIS — E669 Obesity, unspecified: Secondary | ICD-10-CM | POA: Diagnosis not present

## 2023-03-12 DIAGNOSIS — Z888 Allergy status to other drugs, medicaments and biological substances status: Secondary | ICD-10-CM

## 2023-03-12 DIAGNOSIS — R9431 Abnormal electrocardiogram [ECG] [EKG]: Secondary | ICD-10-CM | POA: Diagnosis not present

## 2023-03-12 DIAGNOSIS — Z79899 Other long term (current) drug therapy: Secondary | ICD-10-CM

## 2023-03-12 DIAGNOSIS — I700 Atherosclerosis of aorta: Secondary | ICD-10-CM | POA: Diagnosis not present

## 2023-03-12 DIAGNOSIS — R112 Nausea with vomiting, unspecified: Secondary | ICD-10-CM | POA: Diagnosis not present

## 2023-03-12 DIAGNOSIS — K219 Gastro-esophageal reflux disease without esophagitis: Secondary | ICD-10-CM | POA: Diagnosis present

## 2023-03-12 DIAGNOSIS — R1084 Generalized abdominal pain: Secondary | ICD-10-CM | POA: Diagnosis not present

## 2023-03-12 NOTE — ED Triage Notes (Signed)
Pt in by EMS  Abd pain x2 weeks , recurrent UTI on antx off 2 wks ago Sunday when Abd pain, N/V/ D on and off for the past 2 wks.  Abd reports tender.  149/78, 98.32F, 74bpm, 94% RA.

## 2023-03-12 NOTE — ED Provider Notes (Signed)
[facility] Provider Note    Event Date/Time   First MD Initiated Contact with Patient 03/12/23 2335     (approximate)   History   Abdominal Pain   HPI  Tammy Manning is a 88 y.o. female who presents to the ED for evaluation of Abdominal Pain   I reviewed PCP visit from 4/4, seen for recurrent urinary symptoms of frequency, dysuria and urge incontinence.  Started on 3 weeks of nitrofurantoin by her PCP, was on fosfomycin x 1 and 1 week of Keflex prior to this. Urology clinic visit from 3/19.   3/5 urine culture with MDR Klebsiella, 3/19 urine culture with E. coli resistant only to ampicillin, 3/28 urine culture with E. Coli also resistant to ampicillin.  Patient presents to the ED alongside her husband for elevation of lower abdominal pain and recurrent diarrhea over the past couple weeks.  She reports 3-5 episodes of watery stool per day, transient improved with Imodium at home.  Reports chills without documented fevers.  Reports lower abdominal cramping pain.  Denies sensation of another UTI.   Physical Exam   Triage Vital Signs: ED Triage Vitals  Enc Vitals Group     BP 03/12/23 2331 (!) 135/58     Pulse Rate 03/12/23 2331 78     Resp 03/12/23 2331 16     Temp 03/12/23 2331 98.4 F (36.9 C)     Temp Source 03/12/23 2331 Oral     SpO2 03/12/23 2331 94 %     Weight 03/12/23 2333 175 lb (79.4 kg)     Height 03/12/23 2333  (1.626 m)     Head Circumference --      Peak Flow --      Pain Score 03/12/23 2333 2     Pain Loc --      Pain Edu? --      Excl. in GC? --     Most recent vital signs: Vitals:   03/13/23 0300 03/13/23 0345  BP: (!) 132/51 (!) 135/55  Pulse: 81 78  Resp: 17 16  Temp: 98.2 F (36.8 C)   SpO2: 93% 93%    General: Awake, no distress.  CV:  Good peripheral perfusion.  Resp:  Normal effort.  Abd:  No distention.  Diffuse lower abdominal tenderness without peritoneal features.  Upper abdomen is more  benign MSK:  No deformity noted.  Neuro:  No focal deficits appreciated. Other:     ED Results / Procedures / Treatments   Labs (all labs ordered are listed, but only abnormal results are displayed) Labs Reviewed  COMPREHENSIVE METABOLIC PANEL - Abnormal; Notable for the following components:      Result Value   Glucose, Bld 110 (*)    Total Protein 6.0 (*)    All other components within normal limits  CBC - Abnormal; Notable for the following components:   RBC 3.45 (*)    Hemoglobin 11.5 (*)    HCT 34.9 (*)    MCV 101.2 (*)    RDW 15.9 (*)    All other components within normal limits  URINALYSIS, ROUTINE W REFLEX MICROSCOPIC - Abnormal; Notable for the following components:   Color, Urine STRAW (*)    APPearance CLEAR (*)    Hgb urine dipstick SMALL (*)    Leukocytes,Ua TRACE (*)    All other components within normal limits  URINE CULTURE  C DIFFICILE QUICK SCREEN W PCR REFLEX    LIPASE, BLOOD  EKG   RADIOLOGY CT abdomen/pelvis interpreted by me without evidence of SBO  Official radiology report(s): CT ABDOMEN PELVIS W CONTRAST  Result Date: 03/13/2023 CLINICAL DATA:  Lower abdominal pain, recurrent diarrhea, recurrent urinary tract infection EXAM: CT ABDOMEN AND PELVIS WITH CONTRAST TECHNIQUE: Multidetector CT imaging of the abdomen and pelvis was performed using the standard protocol following bolus administration of intravenous contrast. RADIATION DOSE REDUCTION: This exam was performed according to the departmental dose-optimization program which includes automated exposure control, adjustment of the mA and/or kV according to patient size and/or use of iterative reconstruction technique. CONTRAST:  OMNIPAQUE IOHEXOL 300 MG/ML  SOLN COMPARISON:  None Available. FINDINGS: Lower chest: Calcification of the aortic valve leaflets noted. Cardiac size within normal limits. No pericardial effusion. Visualized lung bases are clear. Hepatobiliary: No focal liver  abnormality is seen. No gallstones, gallbladder wall thickening, or biliary dilatation. Pancreas: Unremarkable Spleen: Unremarkable Adrenals/Urinary Tract: The adrenal glands are unremarkable. Mild bilateral renal cortical atrophy. The kidneys are otherwise unremarkable. The bladder is unremarkable. Stomach/Bowel: Surgical staple line related to colo-anal anastomosis noted. Severe sigmoid diverticulosis. Long segment mural thickening involving the sigmoid colon may relate to underlying muscular hypertrophy though a infectious or inflammatory colitis in this region could appear similarly. The stomach, small bowel, and large bowel are otherwise unremarkable. No evidence of obstruction. No free intraperitoneal gas or fluid. Appendix absent. Vascular/Lymphatic: Extensive aortoiliac atherosclerotic calcification. No aortic aneurysm. No pathologic adenopathy within the abdomen and pelvis. Reproductive: Status post hysterectomy. No adnexal masses. Other: No abdominal wall hernia. Musculoskeletal: Degenerative changes seen at the lumbosacral junction with grade 1 anterolisthesis L4-5. No acute bone abnormality. No lytic or blastic bone lesion. IMPRESSION: 1. Severe sigmoid diverticulosis. Long segment mural thickening involving the sigmoid colon may relate to underlying muscular hypertrophy though a infectious or inflammatory colitis in this region could appear similarly. No evidence of obstruction or perforation. 2.  Aortic Atherosclerosis (ICD10-I70.0). Electronically Signed   By: Helyn Numbers M.D.   On: 03/13/2023 02:39    PROCEDURES and INTERVENTIONS:  .1-3 Lead EKG Interpretation  Performed by: Delton Prairie, MD Authorized by: Delton Prairie, MD     Interpretation: normal     ECG rate:  78   ECG rate assessment: normal     Rhythm: atrial fibrillation     Ectopy: none     Conduction: normal     Medications  lactated ringers bolus 1,000 mL (1,000 mLs Intravenous New Bag/Given 03/13/23 0127)  morphine (PF)  4 MG/ML injection 4 mg (4 mg Intravenous Given 03/13/23 0123)  ondansetron (ZOFRAN) injection 4 mg (4 mg Intravenous Given 03/13/23 0122)  iohexol (OMNIPAQUE) 300 MG/ML solution 100 mL (100 mLs Intravenous Contrast Given 03/13/23 0208)     IMPRESSION / MDM / ASSESSMENT AND PLAN / ED COURSE  I reviewed the triage vital signs and the nursing notes.  Differential diagnosis includes, but is not limited to, C. difficile, SBO, diverticulitis, pyelonephritis, sepsis, AKI  {Patient presents with symptoms of an acute illness or injury that is potentially life-threatening.  88 year old woman with history of recurrent UTIs and recent antibiotic use presents with diarrhea and abdominal pain, with evidence of colitis and requiring medical admission.  Seems uncomfortable and has lower abdominal tenderness without peritoneal features.  Reassuring vital signs.  Blood work without leukocytosis.  Normal electrolytes and renal function.  Urine without infectious features.  CT with nonspecific colitis.  Certainly concerned about C. difficile considering her recent antibiotic courses, and stool samples pending at the  time of admission  Clinical Course as of 03/13/23 0423  Sat Mar 13, 2023  0344 Reassessed.  Still feeling unwell.  Still tender.  Unable to pass stool.  We discussed CT results and plan of care.  We will admit [DS]    Clinical Course User Index [DS] Delton Prairie, MD     FINAL CLINICAL IMPRESSION(S) / ED DIAGNOSES   Final diagnoses:  Colitis  Lower abdominal pain     Rx / DC Orders   ED Discharge Orders     None        Note:  This document was prepared using Dragon voice recognition software and may include unintentional dictation errors.   Delton Prairie, MD 03/13/23 [phone]

## 2023-03-12 NOTE — Patient Instructions (Signed)
Medication Instructions:  No changes *If you need a refill on your cardiac medications before your next appointment, please call your pharmacy*   Lab Work: None ordered If you have labs (blood work) drawn today and your tests are completely normal, you will receive your results only by: MyChart Message (if you have MyChart) OR A paper copy in the mail If you have any lab test that is abnormal or we need to change your treatment, we will call you to review the results.   Testing/Procedures: None ordered   Follow-Up: At  [facility], you and your health needs are our priority.  As part of our continuing mission to provide you with exceptional heart care, we have created designated Provider Care Teams.  These Care Teams include your primary Cardiologist (physician) and Advanced Practice Providers (APPs -  Physician Assistants and Nurse Practitioners) who all work together to provide you with the care you need, when you need it.  We recommend signing up for the patient portal called "MyChart".  Sign up information is provided on this After Visit Summary.  MyChart is used to connect with patients for Virtual Visits (Telemedicine).  Patients are able to view lab/test results, encounter notes, upcoming appointments, etc.  Non-urgent messages can be sent to your provider as well.   To learn more about what you can do with MyChart, go to [email or web address].    Your next appointment:   6 month(s)  Provider:   You may see Muhammad Arida, MD or one of the following Advanced Practice Providers on your designated Care Team:   Christopher Berge, NP Ryan Dunn, PA-C Cadence Furth, PA-C Sheri Hammock, NP    

## 2023-03-12 NOTE — Progress Notes (Signed)
Cardiology Office Note   Date:  03/12/2023   ID:  Tammy Manning, DOB June 08, 1935, MRN [id number]  PCP:  [name], MD  Cardiologist:  Lorine Bears, MD   Chief Complaint  Patient presents with   6 month follow up     Patient c/o weakness & fatigue, nausea, diarrhea and has UTI with irregular heart beats at times. Medications reviewed by the patient verbally.        History of Present Illness: Tammy Manning is a 88 y.o. female who presents for a follow-up visit regarding paroxysmal atrial fibrillation.  She has intolerance to multiple antihypertensive medications.  Cardiac catheterization in April 2018 showed normal coronary arteries and normal LV systolic function.  Abdominal aortogram showed no evidence of renal artery stenosis.    Previous echocardiogram in June 2019 showed an EF of 55 - 60%, mild LVH and mild aortic insufficiency.    She was hospitalized in June of 2022 for atrial fibrillation.  She took an extra dose of chlorthalidone for leg edema and presented with increased fatigue and palpitations.  She was noted to be in atrial fibrillation with RVR.  She was started on diltiazem drip and converted to sinus rhythm.  She was noted to be mildly hypokalemic. The patient was switched from chlorthalidone to diltiazem.  CHA2DS2-VASc score was 4 and thus she was started on anticoagulation with Eliquis 5 mg twice daily.  Repeat echocardiogram in June 2022 showed an EF of 60 to 65%, mildly dilated left atrium with mildly calcified aortic valve.  She has been doing well with no chest pain or shortness of breath.  She reports minimal palpitations at this time.  She reports having GI symptoms due to a virus over the last week but she is currently improving.  Past Medical History:  Diagnosis Date   A-fib    Anginal pain    Aortic insufficiency    a. 02/2020 Echo: EF 60-65%, no rwma, Gr1 DD, nl RV fxn, mildly dil LA, triv AI, Asc Ao 39mm; b. 04/2021 Echo: EF 60-65%, no  rwma, GrI DD, nl RV fxn, mildly dil LA, AI not visualized. Mild-mod AoV sclerosis w/o stenosis.   Arthritis    Knees   B12 deficiency    Mild   Bucket-handle tear of lateral meniscus of left knee as current injury 12/30/2012   Cancer    Skin, basal cell on nose and eyelid   DJD (degenerative joint disease)    Low back   Dyspepsia    GERD (gastroesophageal reflux disease)    Heart murmur    Heart palpitations    History of cardiac cath    a. 02/2017 Cath: Nl cors. Nl LV fxn. Abd Aogram w/o RAS.   HOH (hard of hearing)    Hyperlipidemia    Hypertension    IBS (irritable bowel syndrome)    Lactose intolerance    Mixed incontinence    PONV (postoperative nausea and vomiting)    Vulvar irritation    from urine pad, uses Triamcinolone oint.    Past Surgical History:  Procedure Laterality Date   ABDOMINAL AORTOGRAM N/A 03/15/2017   Procedure: Abdominal Aortogram;  Surgeon: Iran Ouch, MD;  Location: [facility];  Service: Cardiovascular;  Laterality: N/A;   ABDOMINAL HYSTERECTOMY  1982   Large fibroids and bleeding   APPENDECTOMY     BILATERAL SALPINGOOPHORECTOMY  1986   BONE GRAFT HIP ILIAC CREST     To Left arm   BREAST  REDUCTION SURGERY     BREAST SURGERY     CARDIAC CATHETERIZATION     CATARACT EXTRACTION W/PHACO Left 07/07/2016   Procedure: CATARACT EXTRACTION PHACO AND INTRAOCULAR LENS PLACEMENT (IOC);  Surgeon: Galen Manila, MD;  Location: [facility];  Service: Ophthalmology;  Laterality: Left;  [place] 01:10 AP% 18.3 CDE 12.91 Fluid pak lot # [id number]   CATARACT EXTRACTION W/PHACO Right 07/28/2016   Procedure: CATARACT EXTRACTION PHACO AND INTRAOCULAR LENS PLACEMENT (IOC);  Surgeon: Galen Manila, MD;  Location: [facility];  Service: Ophthalmology;  Laterality: Right;  [place] 01:26 AP% 18.5 CDE 16.12 Fluid pack lot # [id number]   COLONOSCOPY  07/2016   Dr. Lottie Mussel   DILATION AND CURETTAGE OF UTERUS     miscarragesx2   ESOPHAGOGASTRODUODENOSCOPY     Polyps    HEMORRHOID SURGERY  2006   KNEE ARTHROSCOPY WITH LATERAL MENISECTOMY Left 12/30/2012   Procedure: KNEE ARTHROSCOPY WITH LATERAL MENISECTOMY;  Surgeon: Eulas Post, MD;  Location: [facility];  Service: Orthopedics;  Laterality: Left;  LEFT KNEE SCOPE LATERAL MENISCECTOMY   LEFT HEART CATH AND CORONARY ANGIOGRAPHY N/A 03/15/2017   Procedure: Left Heart Cath and Coronary Angiography;  Surgeon: Iran Ouch, MD;  Location: [facility];  Service: Cardiovascular;  Laterality: N/A;   MOUTH SURGERY     skin cancer eyelid  2012   TONSILLECTOMY       Current Outpatient Medications  Medication Sig Dispense Refill   acetaminophen (TYLENOL) 325 MG suppository Place 325 mg rectally every 8 (eight) hours as needed.     atorvastatin (LIPITOR) 10 MG tablet TAKE 1 TABLET(10 MG) BY MOUTH DAILY 90 tablet 2   Calcium Carbonate-Vitamin D (CALCIUM 600+D PO) Take 1 tablet by mouth daily.      clobetasol ointment (TEMOVATE) 0.05 % Apply 1 application topically 2 (two) times daily as needed.     clorazepate (TRANXENE) 7.5 MG tablet Take 1 tablet (7.5 mg total) by mouth daily as needed. 30 tablet 0   cyanocobalamin (,VITAMIN B-12,) 1000 MCG/ML injection Inject 1,000 mcg into the muscle every 30 (thirty) days.      diltiazem (CARDIZEM CD) 180 MG 24 hr capsule Take 1 capsule (180 mg total) by mouth daily. 90 capsule 3   ELIQUIS 5 MG TABS tablet TAKE 1 TABLET BY MOUTH TWICE DAILY 180 tablet 1   estradiol (ESTRACE) 0.1 MG/GM vaginal cream Apply a pea-sized amount to fingertip and wipe in vaginal introitus twice weekly 42.5 g 1   famotidine (PEPCID) 20 MG tablet Take 20 mg by mouth 2 (two) times daily as needed for heartburn or indigestion.     guaiFENesin (MUCINEX) 600 MG 12 hr tablet Take 600 mg by mouth 2 (two) times daily as needed.     hydrocortisone (ANUSOL-HC) 2.5 % rectal cream Apply 1 Application topically at bedtime. To affected area for 10 days 30 g 0   irbesartan (AVAPRO) 300 MG  tablet TAKE 1 TABLET BY MOUTH DAILY 90 tablet 2   ketoconazole (NIZORAL) 2 % cream Apply 1 Application topically daily. To affected area 30 g 0   loperamide (IMODIUM) 2 MG capsule Take 2 mg by mouth as needed for diarrhea or loose stools.     MAGNESIUM LACTATE PO Take by mouth 2 (two) times daily.     methotrexate 50 MG/2ML injection Inject 0.6 mLs (15 mg total) into the skin once a week. 4 mL 2   nitrofurantoin, macrocrystal-monohydrate, (MACROBID) 100 MG capsule Take 1 capsule (100 mg  total) by mouth 2 (two) times daily. 6 capsule 1   raNITIdine HCl (ZANTAC PO) Take by mouth.     triamcinolone (NASACORT) 55 MCG/ACT AERO nasal inhaler Place 2 sprays into the nose daily. 1 each 12   TUBERCULIN SYR 1CC/26GX3/8" 26G X 3/8" 1 ML MISC For injection of subcutaneous methotrexate once weekly 25 each 0   Tuberculin-Allergy Syringes 27G X 1/2" 1 ML MISC Use 1 syringe once weekly to inject methotrexate. 12 each 3   No current facility-administered medications for this visit.    Allergies:   Antihistamines, chlorpheniramine-type; Atenolol; Cefuroxime axetil; Ciprofloxacin; Co q 10 [coenzyme q10]; Crestor [rosuvastatin calcium]; Dextromethorphan-guaifenesin; Epinephrine; Ramipril; Sulfamethoxazole-trimethoprim; and Vitamin d analogs    Social History:  The patient  reports that she quit smoking about 59 years ago. Her smoking use included cigarettes. She has a 12.00 pack-year smoking history. She has never used smokeless tobacco. She reports that she does not drink alcohol and does not use drugs.   Family History:  The patient's family history includes Alzheimer's disease in her sister; Breast cancer in her daughter; Cancer in her mother and paternal aunt; Diabetes in her brother, brother, sister, and sister; Esophageal cancer in her brother; Heart disease in her father; Hypothyroidism in her sister; Leukemia in her brother.    ROS:  Please see the history of present illness.   Otherwise, review of systems  are positive for none.   All other systems are reviewed and negative.    PHYSICAL EXAM: VS:  BP (!) 122/58 (BP Location: Left Arm, Patient Position: Sitting, Cuff Size: Normal)   Pulse 81   Ht 5' 4.5" (1.638 m)   Wt 180 lb 2 oz (81.7 kg)   SpO2 98%   BMI 30.44 kg/m  , BMI Body mass index is 30.44 kg/m. GEN: Well nourished, well developed, in no acute distress  HEENT: normal  Neck: no JVD, carotid bruits, or masses Cardiac: RRR; no  rubs, or gallops,no edema, 2/6 systolic murmur in the aortic area. Respiratory:  clear to auscultation bilaterally, normal work of breathing GI: soft, nontender, nondistended, + BS MS: no deformity or atrophy  Skin: warm and dry, no rash Neuro:  Strength and sensation are intact Psych: euthymic mood, full affect   EKG:  EKG is ordered today. The ekg ordered today demonstrates normal sinus rhythm with minimal LVH.  1 PVC.   Recent Labs: 06/23/2022: Magnesium 2.1 12/30/2022: ALT 11; BUN 20; Creat 0.77; Hemoglobin 12.4; Platelets 464; Potassium 5.2; Sodium 139 01/27/2023: TSH 1.45    Lipid Panel    Component Value Date/Time   CHOL 204 (H) 01/27/2023 0820   CHOL 196 02/02/2020 1348   TRIG 102.0 01/27/2023 0820   HDL 88.50 01/27/2023 0820   HDL 80 02/02/2020 1348   CHOLHDL 2 01/27/2023 0820   VLDL 20.4 01/27/2023 0820   LDLCALC 95 01/27/2023 0820   LDLCALC 93 02/02/2020 1348   LDLDIRECT 106 (H) 02/02/2020 1348   LDLDIRECT 105.9 02/08/2013 1050      Wt Readings from Last 3 Encounters:  03/12/23 180 lb 2 oz (81.7 kg)  02/25/23 179 lb (81.2 kg)  02/18/23 181 lb 6.4 oz (82.3 kg)           No data to display            ASSESSMENT AND PLAN:   1.  Paroxysmal atrial fibrillation: She is doing well overall with minimal palpitations at the present time.  Continue current dose of diltiazem.  Continue  anticoagulation with Eliquis 5 mg twice daily.  I reviewed most recent labs done in February which showed normal hemoglobin and renal  function.  2. Essential hypertension:   Blood pressure is well controlled on diltiazem and irbesartan.  3. Hyperlipidemia: Most recent lipid profile showed an LDL of 95.  Continue atorvastatin     Disposition:   FU in 6 months   Signed,  Lorine Bears, MD 03/12/23 [facility] [place], [place] [phone]

## 2023-03-13 ENCOUNTER — Encounter: Payer: Self-pay | Admitting: Family Medicine

## 2023-03-13 ENCOUNTER — Emergency Department: Payer: Medicare Other

## 2023-03-13 DIAGNOSIS — Z87891 Personal history of nicotine dependence: Secondary | ICD-10-CM | POA: Diagnosis not present

## 2023-03-13 DIAGNOSIS — K5730 Diverticulosis of large intestine without perforation or abscess without bleeding: Secondary | ICD-10-CM | POA: Diagnosis present

## 2023-03-13 DIAGNOSIS — R1030 Lower abdominal pain, unspecified: Secondary | ICD-10-CM

## 2023-03-13 DIAGNOSIS — Z8249 Family history of ischemic heart disease and other diseases of the circulatory system: Secondary | ICD-10-CM | POA: Diagnosis not present

## 2023-03-13 DIAGNOSIS — E669 Obesity, unspecified: Secondary | ICD-10-CM

## 2023-03-13 DIAGNOSIS — I493 Ventricular premature depolarization: Secondary | ICD-10-CM | POA: Diagnosis present

## 2023-03-13 DIAGNOSIS — I10 Essential (primary) hypertension: Secondary | ICD-10-CM

## 2023-03-13 DIAGNOSIS — Z66 Do not resuscitate: Secondary | ICD-10-CM | POA: Diagnosis present

## 2023-03-13 DIAGNOSIS — K529 Noninfective gastroenteritis and colitis, unspecified: Secondary | ICD-10-CM | POA: Diagnosis present

## 2023-03-13 DIAGNOSIS — Z6831 Body mass index (BMI) 31.0-31.9, adult: Secondary | ICD-10-CM | POA: Diagnosis not present

## 2023-03-13 DIAGNOSIS — I700 Atherosclerosis of aorta: Secondary | ICD-10-CM | POA: Diagnosis present

## 2023-03-13 DIAGNOSIS — Z820 Family history of epilepsy and other diseases of the nervous system: Secondary | ICD-10-CM | POA: Diagnosis not present

## 2023-03-13 DIAGNOSIS — I5032 Chronic diastolic (congestive) heart failure: Secondary | ICD-10-CM | POA: Diagnosis present

## 2023-03-13 DIAGNOSIS — Z7901 Long term (current) use of anticoagulants: Secondary | ICD-10-CM | POA: Diagnosis not present

## 2023-03-13 DIAGNOSIS — I480 Paroxysmal atrial fibrillation: Secondary | ICD-10-CM | POA: Diagnosis present

## 2023-03-13 DIAGNOSIS — Z803 Family history of malignant neoplasm of breast: Secondary | ICD-10-CM | POA: Diagnosis not present

## 2023-03-13 DIAGNOSIS — Z800 Family history of malignant neoplasm of digestive organs: Secondary | ICD-10-CM | POA: Diagnosis not present

## 2023-03-13 DIAGNOSIS — M059 Rheumatoid arthritis with rheumatoid factor, unspecified: Secondary | ICD-10-CM

## 2023-03-13 DIAGNOSIS — E785 Hyperlipidemia, unspecified: Secondary | ICD-10-CM

## 2023-03-13 DIAGNOSIS — Z833 Family history of diabetes mellitus: Secondary | ICD-10-CM | POA: Diagnosis not present

## 2023-03-13 DIAGNOSIS — Z85828 Personal history of other malignant neoplasm of skin: Secondary | ICD-10-CM | POA: Diagnosis not present

## 2023-03-13 DIAGNOSIS — Z87440 Personal history of urinary (tract) infections: Secondary | ICD-10-CM | POA: Diagnosis not present

## 2023-03-13 DIAGNOSIS — H9190 Unspecified hearing loss, unspecified ear: Secondary | ICD-10-CM | POA: Diagnosis present

## 2023-03-13 DIAGNOSIS — I351 Nonrheumatic aortic (valve) insufficiency: Secondary | ICD-10-CM | POA: Diagnosis present

## 2023-03-13 DIAGNOSIS — Z806 Family history of leukemia: Secondary | ICD-10-CM | POA: Diagnosis not present

## 2023-03-13 DIAGNOSIS — I110 Hypertensive heart disease with heart failure: Secondary | ICD-10-CM | POA: Diagnosis present

## 2023-03-13 LAB — URINALYSIS, ROUTINE W REFLEX MICROSCOPIC
Bacteria, UA: NONE SEEN
Bilirubin Urine: NEGATIVE
Glucose, UA: NEGATIVE mg/dL
Ketones, ur: NEGATIVE mg/dL
Nitrite: NEGATIVE
Protein, ur: NEGATIVE mg/dL
Specific Gravity, Urine: 1.014 (ref 1.005–1.030)
pH: 5 (ref 5.0–8.0)

## 2023-03-13 LAB — GASTROINTESTINAL PANEL BY PCR, STOOL (REPLACES STOOL CULTURE)

## 2023-03-13 LAB — COMPREHENSIVE METABOLIC PANEL
ALT: 16 U/L (ref 0–44)
AST: 19 U/L (ref 15–41)
Albumin: 3.5 g/dL (ref 3.5–5.0)
Alkaline Phosphatase: 45 U/L (ref 38–126)
Anion gap: 6 (ref 5–15)
BUN: 16 mg/dL (ref 8–23)
CO2: 25 mmol/L (ref 22–32)
Calcium: 9.2 mg/dL (ref 8.9–10.3)
Chloride: 104 mmol/L (ref 98–111)
Creatinine, Ser: 0.73 mg/dL (ref 0.44–1.00)
GFR, Estimated: >60 mL/min (ref >60)
Glucose, Bld: 110 mg/dL — ABNORMAL HIGH (ref 70–99)
Potassium: 3.8 mmol/L (ref 3.5–5.1)
Sodium: 135 mmol/L (ref 135–145)
Total Bilirubin: 0.8 mg/dL (ref 0.3–1.2)
Total Protein: 6 g/dL — ABNORMAL LOW (ref 6.5–8.1)

## 2023-03-13 LAB — C DIFFICILE QUICK SCREEN W PCR REFLEX
C Diff antigen: NEGATIVE
C Diff interpretation: NOT DETECTED
C Diff toxin: NEGATIVE

## 2023-03-13 LAB — CBC
HCT: 34.9 % — ABNORMAL LOW (ref 36.0–46.0)
Hemoglobin: 11.5 g/dL — ABNORMAL LOW (ref 12.0–15.0)
MCH: 33.3 pg (ref 26.0–34.0)
MCHC: 33 g/dL (ref 30.0–36.0)
MCV: 101.2 fL — ABNORMAL HIGH (ref 80.0–100.0)
Platelets: 323 10*3/uL (ref 150–400)
RBC: 3.45 MIL/uL — ABNORMAL LOW (ref 3.87–5.11)
RDW: 15.9 % — ABNORMAL HIGH (ref 11.5–15.5)
WBC: 8.1 10*3/uL (ref 4.0–10.5)
nRBC: 0 % (ref 0.0–0.2)

## 2023-03-13 LAB — BRAIN NATRIURETIC PEPTIDE: B Natriuretic Peptide: 60.5 pg/mL (ref 0.0–100.0)

## 2023-03-13 LAB — LIPASE, BLOOD: Lipase: 23 U/L (ref 11–51)

## 2023-03-13 MED ORDER — SODIUM CHLORIDE 0.9 % IV SOLN: INTRAVENOUS | Status: DC

## 2023-03-13 MED ORDER — DILTIAZEM HCL ER COATED BEADS 180 MG PO CP24
180.0000 mg | ORAL_CAPSULE | Freq: Every day | ORAL | Status: DC
  Administered 2023-03-13 – 2023-03-14 (×2): 180 mg via ORAL
  Filled 2023-03-13 (×2): qty 1

## 2023-03-13 MED ORDER — IOHEXOL 300 MG/ML  SOLN
100.0000 mL | Freq: Once | INTRAMUSCULAR | Status: AC | PRN
  Administered 2023-03-13: 100 mL via INTRAVENOUS

## 2023-03-13 MED ORDER — FAMOTIDINE 20 MG PO TABS: 20.0000 mg | ORAL_TABLET | Freq: Two times a day (BID) | ORAL | Status: DC | PRN

## 2023-03-13 MED ORDER — SODIUM CHLORIDE 0.9 % IV SOLN
2.0000 g | INTRAVENOUS | Status: DC
  Administered 2023-03-13: 2 g via INTRAVENOUS
  Filled 2023-03-13: qty 2
  Filled 2023-03-13: qty 20

## 2023-03-13 MED ORDER — TRAZODONE HCL 50 MG PO TABS: 25.0000 mg | ORAL_TABLET | Freq: Every evening | ORAL | Status: DC | PRN

## 2023-03-13 MED ORDER — ENOXAPARIN SODIUM 40 MG/0.4ML IJ SOSY: 40.0000 mg | PREFILLED_SYRINGE | INTRAMUSCULAR | Status: DC

## 2023-03-13 MED ORDER — LACTATED RINGERS IV BOLUS
1000.0000 mL | Freq: Once | INTRAVENOUS | Status: AC
  Administered 2023-03-13: 1000 mL via INTRAVENOUS

## 2023-03-13 MED ORDER — ONDANSETRON HCL 4 MG/2ML IJ SOLN
4.0000 mg | Freq: Three times a day (TID) | INTRAMUSCULAR | Status: DC | PRN
  Administered 2023-03-13: 4 mg via INTRAVENOUS
  Filled 2023-03-13: qty 2

## 2023-03-13 MED ORDER — ACETAMINOPHEN 325 MG PO TABS: 650.0000 mg | ORAL_TABLET | Freq: Four times a day (QID) | ORAL | Status: DC | PRN

## 2023-03-13 MED ORDER — OYSTER SHELL CALCIUM/D3 500-5 MG-MCG PO TABS
1.0000 | ORAL_TABLET | Freq: Every day | ORAL | Status: DC
  Administered 2023-03-14: 1 via ORAL
  Filled 2023-03-13: qty 1

## 2023-03-13 MED ORDER — ONDANSETRON HCL 4 MG/2ML IJ SOLN
4.0000 mg | Freq: Once | INTRAMUSCULAR | Status: AC
  Administered 2023-03-13: 4 mg via INTRAVENOUS
  Filled 2023-03-13: qty 2

## 2023-03-13 MED ORDER — MORPHINE SULFATE (PF) 2 MG/ML IV SOLN: 2.0000 mg | INTRAVENOUS | Status: DC | PRN

## 2023-03-13 MED ORDER — APIXABAN 5 MG PO TABS
5.0000 mg | ORAL_TABLET | Freq: Two times a day (BID) | ORAL | Status: DC
  Administered 2023-03-13 – 2023-03-14 (×3): 5 mg via ORAL
  Filled 2023-03-13 (×3): qty 1

## 2023-03-13 MED ORDER — POTASSIUM CHLORIDE IN NACL 20-0.9 MEQ/L-% IV SOLN
INTRAVENOUS | Status: DC
  Filled 2023-03-13: qty 1000

## 2023-03-13 MED ORDER — ATORVASTATIN CALCIUM 10 MG PO TABS
10.0000 mg | ORAL_TABLET | Freq: Every day | ORAL | Status: DC
  Administered 2023-03-13 – 2023-03-14 (×2): 10 mg via ORAL
  Filled 2023-03-13 (×2): qty 1

## 2023-03-13 MED ORDER — METRONIDAZOLE 500 MG/100ML IV SOLN
500.0000 mg | Freq: Three times a day (TID) | INTRAVENOUS | Status: DC
  Administered 2023-03-13 – 2023-03-14 (×4): 500 mg via INTRAVENOUS
  Filled 2023-03-13 (×5): qty 100

## 2023-03-13 MED ORDER — AZTREONAM 1 G IM
1.0000 g | Freq: Three times a day (TID) | INTRAMUSCULAR | Status: DC
  Filled 2023-03-13: qty 1

## 2023-03-13 MED ORDER — HYDRALAZINE HCL 20 MG/ML IJ SOLN: 5.0000 mg | INTRAMUSCULAR | Status: DC | PRN

## 2023-03-13 MED ORDER — MORPHINE SULFATE (PF) 4 MG/ML IV SOLN
4.0000 mg | Freq: Once | INTRAVENOUS | Status: AC
  Administered 2023-03-13: 4 mg via INTRAVENOUS
  Filled 2023-03-13: qty 1

## 2023-03-13 MED ORDER — CLORAZEPATE DIPOTASSIUM 7.5 MG PO TABS: 7.5000 mg | ORAL_TABLET | Freq: Every day | ORAL | Status: DC | PRN

## 2023-03-13 MED ORDER — BISACODYL 10 MG RE SUPP: 10.0000 mg | Freq: Every day | RECTAL | Status: DC | PRN

## 2023-03-13 MED ORDER — SODIUM CHLORIDE 0.9 % IV SOLN: INTRAVENOUS | Status: DC | PRN

## 2023-03-13 MED ORDER — OXYCODONE-ACETAMINOPHEN 5-325 MG PO TABS: 1.0000 | ORAL_TABLET | ORAL | Status: DC | PRN

## 2023-03-13 MED ORDER — LOPERAMIDE HCL 2 MG PO CAPS: 2.0000 mg | ORAL_CAPSULE | ORAL | Status: DC | PRN

## 2023-03-13 MED ORDER — SODIUM CHLORIDE 0.9 % IV SOLN
2.0000 g | Freq: Three times a day (TID) | INTRAVENOUS | Status: DC
  Administered 2023-03-13: 2 g via INTRAVENOUS
  Filled 2023-03-13: qty 2
  Filled 2023-03-13: qty 10

## 2023-03-13 MED ORDER — ACETAMINOPHEN 650 MG RE SUPP: 650.0000 mg | Freq: Four times a day (QID) | RECTAL | Status: DC | PRN

## 2023-03-13 MED ORDER — IRBESARTAN 150 MG PO TABS
300.0000 mg | ORAL_TABLET | Freq: Every day | ORAL | Status: DC
  Administered 2023-03-13: 300 mg via ORAL
  Filled 2023-03-13: qty 2

## 2023-03-13 MED ORDER — MORPHINE SULFATE (PF) 2 MG/ML IV SOLN: 0.5000 mg | INTRAVENOUS | Status: DC | PRN

## 2023-03-13 NOTE — Consult Note (Signed)
Tammy Repress, MD [address]  [address]  [place], [place] [address]  Main: [phone]  Fax: [phone] Pager: [phone]   Consultation  Referring Provider:   No ref. provider found Primary Care Physician:  [name], Audrie Gallus, MD Primary Gastroenterologist: Gentry Fitz        Reason for Consultation: Left lower quadrant pain and diarrhea    Date of Admission:  03/12/2023 Date of Consultation:  03/13/2023         HPI:   Tammy Manning is a 88 y.o. female history of rheumatoid arthritis on methotrexate, left-sided colonic diverticulosis, segmental colitis associated with diverticulosis, A-fib on Eliquis presented with approximately 2 weeks history of left lower quadrant pain, cramping in nature, associated with nausea, vomiting and nonbloody diarrhea.  She reports that her bowel movements have been soft, yesterday she had 3-4 watery bowel movements, today she had 2 so far.  She has been hemodynamically stable, no evidence of leukocytosis, underwent CT abdomen and pelvis which revealed sigmoid colon colitis.  Underwent stool studies negative for infection including C. difficile.  Started on empiric antibiotics.  She reports that her pain has significantly improved.  Has mild discomfort in the right lower abdomen only.  Does not have good appetite, on clear liquid diet, denies any nausea or vomiting.  Patient denies any recent sick contacts,  NSAIDs: None  Antiplts/Anticoagulants/Anti thrombotics: Eliquis for history of A-fib  GI Procedures: Colonoscopy in 2019 in [place], revealed left-sided diverticulosis, medium size internal hemorrhoids, SCAD  Colonoscopy 2017, tubular adenoma of the colon resected  Past Medical History:  Diagnosis Date   A-fib    Anginal pain    Aortic insufficiency    a. 02/2020 Echo: EF 60-65%, no rwma, Gr1 DD, nl RV fxn, mildly dil LA, triv AI, Asc Ao 39mm; b. 04/2021 Echo: EF 60-65%, no rwma, GrI DD, nl RV fxn, mildly dil LA, AI not visualized.  Mild-mod AoV sclerosis w/o stenosis.   Arthritis    Knees   B12 deficiency    Mild   Bucket-handle tear of lateral meniscus of left knee as current injury 12/30/2012   Cancer    Skin, basal cell on nose and eyelid   DJD (degenerative joint disease)    Low back   Dyspepsia    GERD (gastroesophageal reflux disease)    Heart murmur    Heart palpitations    History of cardiac cath    a. 02/2017 Cath: Nl cors. Nl LV fxn. Abd Aogram w/o RAS.   HOH (hard of hearing)    Hyperlipidemia    Hypertension    IBS (irritable bowel syndrome)    Lactose intolerance    Mixed incontinence    PONV (postoperative nausea and vomiting)    Vulvar irritation    from urine pad, uses Triamcinolone oint.    Past Surgical History:  Procedure Laterality Date   ABDOMINAL AORTOGRAM N/A 03/15/2017   Procedure: Abdominal Aortogram;  Surgeon: Iran Ouch, MD;  Location: [facility];  Service: Cardiovascular;  Laterality: N/A;   ABDOMINAL HYSTERECTOMY  1982   Large fibroids and bleeding   APPENDECTOMY     BILATERAL SALPINGOOPHORECTOMY  1986   BONE GRAFT HIP ILIAC CREST     To Left arm   BREAST REDUCTION SURGERY     BREAST SURGERY     CARDIAC CATHETERIZATION     CATARACT EXTRACTION W/PHACO Left 07/07/2016   Procedure: CATARACT EXTRACTION PHACO AND INTRAOCULAR LENS PLACEMENT (IOC);  Surgeon: Chrissie Noa  Porfilio, MD;  Location: [facility];  Service: Ophthalmology;  Laterality: Left;  [place] 01:10 AP% 18.3 CDE 12.91 Fluid pak lot # [id number]   CATARACT EXTRACTION W/PHACO Right 07/28/2016   Procedure: CATARACT EXTRACTION PHACO AND INTRAOCULAR LENS PLACEMENT (IOC);  Surgeon: Galen Manila, MD;  Location: [facility];  Service: Ophthalmology;  Laterality: Right;  [place] 01:26 AP% 18.5 CDE 16.12 Fluid pack lot # [id number]   COLONOSCOPY  07/2016   Dr. Lottie Mussel   DILATION AND CURETTAGE OF UTERUS     miscarragesx2   ESOPHAGOGASTRODUODENOSCOPY     Polyps   HEMORRHOID SURGERY  2006   KNEE ARTHROSCOPY WITH LATERAL  MENISECTOMY Left 12/30/2012   Procedure: KNEE ARTHROSCOPY WITH LATERAL MENISECTOMY;  Surgeon: Eulas Post, MD;  Location: [facility];  Service: Orthopedics;  Laterality: Left;  LEFT KNEE SCOPE LATERAL MENISCECTOMY   LEFT HEART CATH AND CORONARY ANGIOGRAPHY N/A 03/15/2017   Procedure: Left Heart Cath and Coronary Angiography;  Surgeon: Iran Ouch, MD;  Location: [facility];  Service: Cardiovascular;  Laterality: N/A;   MOUTH SURGERY     skin cancer eyelid  2012   TONSILLECTOMY       Current Facility-Administered Medications:    0.9 %  sodium chloride infusion, , Intravenous, PRN, Lorretta Harp, MD, Stopped at 03/13/23 1109   acetaminophen (TYLENOL) tablet 650 mg, 650 mg, Oral, Q6H PRN **OR** acetaminophen (TYLENOL) suppository 650 mg, 650 mg, Rectal, Q6H PRN, Mansy, Jan A, MD   apixaban (ELIQUIS) tablet 5 mg, 5 mg, Oral, BID, Lorretta Harp, MD, 5 mg at 03/13/23 1106   atorvastatin (LIPITOR) tablet 10 mg, 10 mg, Oral, Daily, Lorretta Harp, MD   aztreonam (AZACTAM) 2 g in sodium chloride 0.9 % 100 mL IVPB, 2 g, Intravenous, Q8H, Coulter, Carolyn, RPH, Last Rate: 200 mL/hr at 03/13/23 1135, Infusion Verify at 03/13/23 1135   [START ON 03/14/2023] calcium-vitamin D (OSCAL WITH D) 500-5 MG-MCG per tablet 1 tablet, 1 tablet, Oral, Daily, Lorretta Harp, MD   clorazepate (TRANXENE) tablet 7.5 mg, 7.5 mg, Oral, Daily PRN, Lorretta Harp, MD   diltiazem (CARDIZEM CD) 24 hr capsule 180 mg, 180 mg, Oral, Daily, Lorretta Harp, MD, 180 mg at 03/13/23 1106   famotidine (PEPCID) tablet 20 mg, 20 mg, Oral, BID PRN, Lorretta Harp, MD   hydrALAZINE (APRESOLINE) injection 5 mg, 5 mg, Intravenous, Q2H PRN, Lorretta Harp, MD   irbesartan (AVAPRO) tablet 300 mg, 300 mg, Oral, Daily, Lorretta Harp, MD   loperamide (IMODIUM) capsule 2 mg, 2 mg, Oral, PRN, Lorretta Harp, MD   metroNIDAZOLE (FLAGYL) IVPB 500 mg, 500 mg, Intravenous, Q8H, Mansy, Jan A, MD, Stopped at 03/13/23 1007   morphine (PF) 2 MG/ML injection 0.5  mg, 0.5 mg, Intravenous, Q4H PRN, Lorretta Harp, MD   ondansetron [facility]) injection 4 mg, 4 mg, Intravenous, Q8H PRN, Lorretta Harp, MD, 4 mg at 03/13/23 1914   oxyCODONE-acetaminophen (PERCOCET/ROXICET) 5-325 MG per tablet 1 tablet, 1 tablet, Oral, Q4H PRN, Lorretta Harp, MD   traZODone (DESYREL) tablet 25 mg, 25 mg, Oral, QHS PRN, Mansy, Vernetta Honey, MD   Family History  Problem Relation Age of Onset   Cancer Mother        Colon   Heart disease Father        CAD   Diabetes Sister    Hypothyroidism Sister    Diabetes Brother    Leukemia Brother    Esophageal cancer Brother    Cancer Paternal Aunt  Breast   Diabetes Sister    Alzheimer's disease Sister    Diabetes Brother    Breast cancer Daughter      Social History   Tobacco Use   Smoking status: Former    Packs/day: 1.00    Years: 12.00    Additional pack years: 0.00    Total pack years: 12.00    Types: Cigarettes    Quit date: 11/24/1963    Years since quitting: 59.3   Smokeless tobacco: Never  Vaping Use   Vaping Use: Never used  Substance Use Topics   Alcohol use: No    Alcohol/week: 0.0 standard drinks of alcohol   Drug use: No    Allergies as of 03/12/2023 - Review Complete 03/12/2023  Allergen Reaction Noted   Antihistamines, chlorpheniramine-type Other (See Comments) 08/30/2015   Atenolol Hypertension 05/19/2017   Cefuroxime axetil Other (See Comments) 06/24/2007   Ciprofloxacin Other (See Comments) 06/24/2007   Co q 10 [coenzyme q10] Other (See Comments) 03/06/2015   Crestor [rosuvastatin calcium] Other (See Comments) 03/11/2011   Dextromethorphan-guaifenesin Other (See Comments) 11/21/2008   Epinephrine Hives 06/24/2007   Ramipril Other (See Comments) 06/24/2007   Sulfamethoxazole-trimethoprim Other (See Comments) 06/24/2007   Vitamin d analogs Other (See Comments) 03/06/2015    Review of Systems:    All systems reviewed and negative except where noted in HPI.   Physical Exam:  Vital signs in last 24  hours: Temp:  [97.5 F (36.4 C)-98.4 F (36.9 C)] 98 F (36.7 C) (04/20 0856) Pulse Rate:  [77-81] 77 (04/20 0856) Resp:  [16-18] 18 (04/20 0856) BP: (115-138)/(50-74) 123/67 (04/20 0856) SpO2:  [93 %-95 %] 95 % (04/20 0856) Weight:  [79.4 kg-82.1 kg] 82.1 kg (04/20 0500) Last BM Date : 03/13/23 General:   Pleasant, cooperative in NAD Head:  Normocephalic and atraumatic. Eyes:   No icterus.   Conjunctiva pink. PERRLA. Ears:  Normal auditory acuity. Neck:  Supple; no masses or thyroidomegaly Lungs: Respirations even and unlabored. Lungs clear to auscultation bilaterally.   No wheezes, crackles, or rhonchi.  Heart:  Regular rate and rhythm;  Without murmur, clicks, rubs or gallops Abdomen:  Soft, nondistended, nontender. Normal bowel sounds. No appreciable masses or hepatomegaly.  No rebound or guarding.  Rectal:  Not performed. Msk:  Symmetrical without gross deformities.  Strength generalized weakness Extremities:  Without edema, cyanosis or clubbing. Neurologic:  Alert and oriented x3;  grossly normal neurologically. Skin:  Intact without significant lesions or rashes. Psych:  Alert and cooperative. Normal affect.  LAB RESULTS:    Latest Ref Rng & Units 03/13/2023    1:27 AM 12/30/2022   11:58 AM 06/23/2022    2:24 PM  CBC  WBC 4.0 - 10.5 K/uL 8.1  7.8  6.4   Hemoglobin 12.0 - 15.0 g/dL 45.4  09.8  11.9   Hematocrit 36.0 - 46.0 % 34.9  36.5  35.6   Platelets 150 - 400 K/uL 323  464  302     BMET    Latest Ref Rng & Units 03/13/2023    1:27 AM 12/30/2022   11:58 AM 06/23/2022    2:24 PM  BMP  Glucose 70 - 99 mg/dL 147  86  829   BUN 8 - 23 mg/dL 16  20  21    Creatinine 0.44 - 1.00 mg/dL 5.62  1.30  8.65   BUN/Creat Ratio 6 - 22 (calc)  SEE NOTE:    Sodium 135 - 145 mmol/L 135  139  139  Potassium 3.5 - 5.1 mmol/L 3.8  5.2  3.9   Chloride 98 - 111 mmol/L 104  103  108   CO2 22 - 32 mmol/L 25  28  24    Calcium 8.9 - 10.3 mg/dL 9.2  29.5  9.4     LFT    Latest Ref Rng &  Units 03/13/2023    1:27 AM 12/30/2022   11:58 AM 04/09/2022   10:30 AM  Hepatic Function  Total Protein 6.5 - 8.1 g/dL 6.0  6.6  6.2   Albumin 3.5 - 5.0 g/dL 3.5   3.8   AST 15 - 41 U/L 19  11  11    ALT 0 - 44 U/L 16  11  11    Alk Phosphatase 38 - 126 U/L 45   39   Total Bilirubin 0.3 - 1.2 mg/dL 0.8  0.4  0.6   Bilirubin, Direct 0.0 - 0.3 mg/dL   0.1      STUDIES: CT ABDOMEN PELVIS W CONTRAST  Result Date: 03/13/2023 CLINICAL DATA:  Lower abdominal pain, recurrent diarrhea, recurrent urinary tract infection EXAM: CT ABDOMEN AND PELVIS WITH CONTRAST TECHNIQUE: Multidetector CT imaging of the abdomen and pelvis was performed using the standard protocol following bolus administration of intravenous contrast. RADIATION DOSE REDUCTION: This exam was performed according to the departmental dose-optimization program which includes automated exposure control, adjustment of the mA and/or kV according to patient size and/or use of iterative reconstruction technique. CONTRAST:  OMNIPAQUE IOHEXOL 300 MG/ML  SOLN COMPARISON:  None Available. FINDINGS: Lower chest: Calcification of the aortic valve leaflets noted. Cardiac size within normal limits. No pericardial effusion. Visualized lung bases are clear. Hepatobiliary: No focal liver abnormality is seen. No gallstones, gallbladder wall thickening, or biliary dilatation. Pancreas: Unremarkable Spleen: Unremarkable Adrenals/Urinary Tract: The adrenal glands are unremarkable. Mild bilateral renal cortical atrophy. The kidneys are otherwise unremarkable. The bladder is unremarkable. Stomach/Bowel: Surgical staple line related to colo-anal anastomosis noted. Severe sigmoid diverticulosis. Long segment mural thickening involving the sigmoid colon may relate to underlying muscular hypertrophy though a infectious or inflammatory colitis in this region could appear similarly. The stomach, small bowel, and large bowel are otherwise unremarkable. No evidence of  obstruction. No free intraperitoneal gas or fluid. Appendix absent. Vascular/Lymphatic: Extensive aortoiliac atherosclerotic calcification. No aortic aneurysm. No pathologic adenopathy within the abdomen and pelvis. Reproductive: Status post hysterectomy. No adnexal masses. Other: No abdominal wall hernia. Musculoskeletal: Degenerative changes seen at the lumbosacral junction with grade 1 anterolisthesis L4-5. No acute bone abnormality. No lytic or blastic bone lesion. IMPRESSION: 1. Severe sigmoid diverticulosis. Long segment mural thickening involving the sigmoid colon may relate to underlying muscular hypertrophy though a infectious or inflammatory colitis in this region could appear similarly. No evidence of obstruction or perforation. 2.  Aortic Atherosclerosis (ICD10-I70.0). Electronically Signed   By: Helyn Numbers M.D.   On: 03/13/2023 02:39      Impression / Plan:   Kiasha Bellin is a 88 y.o. female with history of A-fib on Eliquis, hypertension, hyperlipidemia, TIA, GERD, rheumatoid arthritis on methotrexate, sigmoid diverticulosis, SCAD, hemorrhoids, s/p ligation in the past is admitted with approximately 2 weeks history of left lower quadrant pain associated with nausea, vomiting and nonbloody diarrhea.  CT revealed possible thickening of the sigmoid colon.  Patient was recently treated for UTI, 2 weeks ago. Stool studies negative for infectious etiology Continue empiric antibiotics, total 5 days, okay to continue only Flagyl Advance diet as tolerated Patient is on Eliquis as inpatient  Discussed with her regarding inpatient colonoscopy versus outpatient colonoscopy within next 2 weeks when she is off Eliquis for 2 days before the procedure Patient opted outpatient colonoscopy because she has to stay 2 more days in order to undergo colonoscopy once she is off Eliquis She can be discharged home tomorrow from GI standpoint if she continues to feel better  Thank you for involving me in the  care of this patient.      LOS: 0 days   Lannette Donath, MD  03/13/2023, 3:21 PM    Note: This dictation was prepared with Dragon dictation along with smaller phrase technology. Any transcriptional errors that result from this process are unintentional.

## 2023-03-13 NOTE — ED Notes (Signed)
ED TO INPATIENT HANDOFF REPORT  ED Nurse Name and Phone #: Gwenyth Bender [phone]  S Name/Age/Gender Tammy Manning 88 y.o. female Room/Bed: ED25A/ED25A  Code Status   Code Status: Prior  Home/SNF/Other Home Patient oriented to: self, place, time, and situation Is this baseline? Yes   Triage Complete: Triage complete  Chief Complaint Acute colitis [K52.9]  Triage Note Pt in by EMS  Abd pain x2 weeks , recurrent UTI on antx off 2 wks ago Sunday when Abd pain, N/V/ D on and off for the past 2 wks.  Abd reports tender.  149/78, 98.80F, 74bpm, 94% RA.    Allergies Allergies  Allergen Reactions   Antihistamines, Chlorpheniramine-Type Other (See Comments)    Reaction:  Makes pt hyper    Atenolol Hypertension   Cefuroxime Axetil Other (See Comments)    Upset stomach   Ciprofloxacin Other (See Comments)    Upset stomach   Co Q 10 [Coenzyme Q10] Other (See Comments)    Reaction:  GI upset    Crestor [Rosuvastatin Calcium] Other (See Comments)    Reaction:  Myalgias    Dextromethorphan-Guaifenesin Other (See Comments)    Reaction:  Made pt jittery    Epinephrine Hives   Ramipril Other (See Comments)    Upset stomach   Sulfamethoxazole-Trimethoprim Other (See Comments)    Upset stomach   Vitamin D Analogs Other (See Comments)    Reaction:  GI upset     Level of Care/Admitting Diagnosis ED Disposition     ED Disposition  Admit   Condition  --   Comment  Hospital Area: [facility] [[id number]]  Level of Care: Med-Surg [16]  Covid Evaluation: Asymptomatic - no recent exposure (last 10 days) testing not required  Diagnosis: Acute colitis [147829]  Admitting Physician: Hannah Beat [[id number]]  Attending Physician: Hannah Beat [[id number]]  Certification:: I certify this patient will need inpatient services for at least 2 midnights  Estimated Length of Stay: 2          B Medical/Surgery History Past Medical History:  Diagnosis Date   A-fib     Anginal pain    Aortic insufficiency    a. 02/2020 Echo: EF 60-65%, no rwma, Gr1 DD, nl RV fxn, mildly dil LA, triv AI, Asc Ao 39mm; b. 04/2021 Echo: EF 60-65%, no rwma, GrI DD, nl RV fxn, mildly dil LA, AI not visualized. Mild-mod AoV sclerosis w/o stenosis.   Arthritis    Knees   B12 deficiency    Mild   Bucket-handle tear of lateral meniscus of left knee as current injury 12/30/2012   Cancer    Skin, basal cell on nose and eyelid   DJD (degenerative joint disease)    Low back   Dyspepsia    GERD (gastroesophageal reflux disease)    Heart murmur    Heart palpitations    History of cardiac cath    a. 02/2017 Cath: Nl cors. Nl LV fxn. Abd Aogram w/o RAS.   HOH (hard of hearing)    Hyperlipidemia    Hypertension    IBS (irritable bowel syndrome)    Lactose intolerance    Mixed incontinence    PONV (postoperative nausea and vomiting)    Vulvar irritation    from urine pad, uses Triamcinolone oint.   Past Surgical History:  Procedure Laterality Date   ABDOMINAL AORTOGRAM N/A 03/15/2017   Procedure: Abdominal Aortogram;  Surgeon: Iran Ouch, MD;  Location: [facility];  Service: Cardiovascular;  Laterality: N/A;   ABDOMINAL HYSTERECTOMY  1982   Large fibroids and bleeding   APPENDECTOMY     BILATERAL SALPINGOOPHORECTOMY  1986   BONE GRAFT HIP ILIAC CREST     To Left arm   BREAST REDUCTION SURGERY     BREAST SURGERY     CARDIAC CATHETERIZATION     CATARACT EXTRACTION W/PHACO Left 07/07/2016   Procedure: CATARACT EXTRACTION PHACO AND INTRAOCULAR LENS PLACEMENT (IOC);  Surgeon: Galen Manila, MD;  Location: [facility];  Service: Ophthalmology;  Laterality: Left;  [place] 01:10 AP% 18.3 CDE 12.91 Fluid pak lot # [id number]   CATARACT EXTRACTION W/PHACO Right 07/28/2016   Procedure: CATARACT EXTRACTION PHACO AND INTRAOCULAR LENS PLACEMENT (IOC);  Surgeon: Galen Manila, MD;  Location: [facility];  Service: Ophthalmology;  Laterality: Right;  [place] 01:26 AP% 18.5 CDE  16.12 Fluid pack lot # [id number]   COLONOSCOPY  07/2016   Dr. Lottie Mussel   DILATION AND CURETTAGE OF UTERUS     miscarragesx2   ESOPHAGOGASTRODUODENOSCOPY     Polyps   HEMORRHOID SURGERY  2006   KNEE ARTHROSCOPY WITH LATERAL MENISECTOMY Left 12/30/2012   Procedure: KNEE ARTHROSCOPY WITH LATERAL MENISECTOMY;  Surgeon: Eulas Post, MD;  Location: [facility];  Service: Orthopedics;  Laterality: Left;  LEFT KNEE SCOPE LATERAL MENISCECTOMY   LEFT HEART CATH AND CORONARY ANGIOGRAPHY N/A 03/15/2017   Procedure: Left Heart Cath and Coronary Angiography;  Surgeon: Iran Ouch, MD;  Location: [facility];  Service: Cardiovascular;  Laterality: N/A;   MOUTH SURGERY     skin cancer eyelid  2012   TONSILLECTOMY       A IV Location/Drains/Wounds Patient Lines/Drains/Airways Status     Active Line/Drains/Airways     Name Placement date Placement time Site Days   Peripheral IV 03/13/23 22 G Anterior;Right Forearm 03/13/23  0122  Forearm  less than 1            Intake/Output Last 24 hours No intake or output data in the 24 hours ending 03/13/23 0439  Labs/Imaging Results for orders placed or performed during the hospital encounter of 03/12/23 (from the past 48 hour(s))  Lipase, blood     Status: None   Collection Time: 03/13/23  1:27 AM  Result Value Ref Range   Lipase 23 11 - 51 U/L    Comment: Performed at [facility], [address] [place], [place] [address]  Comprehensive metabolic panel     Status: Abnormal   Collection Time: 03/13/23  1:27 AM  Result Value Ref Range   Sodium 135 135 - 145 mmol/L   Potassium 3.8 3.5 - 5.1 mmol/L   Chloride 104 98 - 111 mmol/L   CO2 25 22 - 32 mmol/L   Glucose, Bld 110 (H) 70 - 99 mg/dL    Comment: Glucose reference range applies only to samples taken after fasting for at least 8 hours.   BUN 16 8 - 23 mg/dL   Creatinine, Ser 6.57 0.44 - 1.00 mg/dL   Calcium 9.2 8.9 - 84.6 mg/dL   Total Protein 6.0 (L)  6.5 - 8.1 g/dL   Albumin 3.5 3.5 - 5.0 g/dL   Manning 19 15 - 41 U/L   ALT 16 0 - 44 U/L   Alkaline Phosphatase 45 38 - 126 U/L   Total Bilirubin 0.8 0.3 - 1.2 mg/dL   GFR, Estimated >96 >29 mL/min    Comment: (NOTE) Calculated using the CKD-EPI Creatinine Equation (2021)  Anion gap 6 5 - 15    Comment: Performed at [facility], [address] [place], [place] [address]  CBC     Status: Abnormal   Collection Time: 03/13/23  1:27 AM  Result Value Ref Range   WBC 8.1 4.0 - 10.5 K/uL   RBC 3.45 (L) 3.87 - 5.11 MIL/uL   Hemoglobin 11.5 (L) 12.0 - 15.0 g/dL   HCT 60.4 (L) 54.0 - 98.1 %   MCV 101.2 (H) 80.0 - 100.0 fL   MCH 33.3 26.0 - 34.0 pg   MCHC 33.0 30.0 - 36.0 g/dL   RDW 19.1 (H) 47.8 - 29.5 %   Platelets 323 150 - 400 K/uL   nRBC 0.0 0.0 - 0.2 %    Comment: Performed at [facility], [address] [place], [place] [address]  Urinalysis, Routine w reflex microscopic -Urine, Clean Catch     Status: Abnormal   Collection Time: 03/13/23  1:27 AM  Result Value Ref Range   Color, Urine STRAW (A) YELLOW   APPearance CLEAR (A) CLEAR   Specific Gravity, Urine 1.014 1.005 - 1.030   pH 5.0 5.0 - 8.0   Glucose, UA NEGATIVE NEGATIVE mg/dL   Hgb urine dipstick SMALL (A) NEGATIVE   Bilirubin Urine NEGATIVE NEGATIVE   Ketones, ur NEGATIVE NEGATIVE mg/dL   Protein, ur NEGATIVE NEGATIVE mg/dL   Nitrite NEGATIVE NEGATIVE   Leukocytes,Ua TRACE (A) NEGATIVE   RBC / HPF 0-5 0 - 5 RBC/hpf   WBC, UA 0-5 0 - 5 WBC/hpf   Bacteria, UA NONE SEEN NONE SEEN   Squamous Epithelial / HPF 0-5 0 - 5 /HPF   Mucus PRESENT     Comment: Performed at [facility], [address] [place], [place] [address]   CT ABDOMEN PELVIS W CONTRAST  Result Date: 03/13/2023 CLINICAL DATA:  Lower abdominal pain, recurrent diarrhea, recurrent urinary tract infection EXAM: CT ABDOMEN AND PELVIS WITH CONTRAST TECHNIQUE: Multidetector CT imaging of the abdomen and pelvis was performed using  the standard protocol following bolus administration of intravenous contrast. RADIATION DOSE REDUCTION: This exam was performed according to the departmental dose-optimization program which includes automated exposure control, adjustment of the mA and/or kV according to patient size and/or use of iterative reconstruction technique. CONTRAST:  OMNIPAQUE IOHEXOL 300 MG/ML  SOLN COMPARISON:  None Available. FINDINGS: Lower chest: Calcification of the aortic valve leaflets noted. Cardiac size within normal limits. No pericardial effusion. Visualized lung bases are clear. Hepatobiliary: No focal liver abnormality is seen. No gallstones, gallbladder wall thickening, or biliary dilatation. Pancreas: Unremarkable Spleen: Unremarkable Adrenals/Urinary Tract: The adrenal glands are unremarkable. Mild bilateral renal cortical atrophy. The kidneys are otherwise unremarkable. The bladder is unremarkable. Stomach/Bowel: Surgical staple line related to colo-anal anastomosis noted. Severe sigmoid diverticulosis. Long segment mural thickening involving the sigmoid colon may relate to underlying muscular hypertrophy though a infectious or inflammatory colitis in this region could appear similarly. The stomach, small bowel, and large bowel are otherwise unremarkable. No evidence of obstruction. No free intraperitoneal gas or fluid. Appendix absent. Vascular/Lymphatic: Extensive aortoiliac atherosclerotic calcification. No aortic aneurysm. No pathologic adenopathy within the abdomen and pelvis. Reproductive: Status post hysterectomy. No adnexal masses. Other: No abdominal wall hernia. Musculoskeletal: Degenerative changes seen at the lumbosacral junction with grade 1 anterolisthesis L4-5. No acute bone abnormality. No lytic or blastic bone lesion. IMPRESSION: 1. Severe sigmoid diverticulosis. Long segment mural thickening involving the sigmoid colon may relate to underlying muscular hypertrophy though a  infectious or inflammatory  colitis in this region could appear similarly. No evidence of obstruction or perforation. 2.  Aortic Atherosclerosis (ICD10-I70.0). Electronically Signed   By: Helyn Numbers M.D.   On: 03/13/2023 02:39    Pending Labs Unresulted Labs (From admission, onward)     Start     Ordered   03/13/23 0018  C Difficile Quick Screen w PCR reflex  (C Difficile quick screen w PCR reflex panel )  Once, for 24 hours,   URGENT       References:    CDiff Information Tool   03/13/23 0018   03/12/23 2345  Urine Culture  Once,   URGENT       Question:  Indication  Answer:  Dysuria   03/12/23 2344            Vitals/Pain Today's Vitals   03/12/23 2333 03/13/23 0130 03/13/23 0300 03/13/23 0345  BP:  138/69 (!) 132/51 (!) 135/55  Pulse:  79 81 78  Resp:  Temp:   98.2 F (36.8 C)   TempSrc:   Oral   SpO2:  93% 93% 93%  Weight: 79.4 kg     Height:  (1.626 m)     PainSc: 2  4       Isolation Precautions Enteric precautions (UV disinfection)  Medications Medications  lactated ringers bolus 1,000 mL (1,000 mLs Intravenous New Bag/Given 03/13/23 0127)  morphine (PF) 4 MG/ML injection 4 mg (4 mg Intravenous Given 03/13/23 0123)  ondansetron (ZOFRAN) injection 4 mg (4 mg Intravenous Given 03/13/23 0122)  iohexol (OMNIPAQUE) 300 MG/ML solution 100 mL (100 mLs Intravenous Contrast Given 03/13/23 0208)    Mobility walks with person assist     Focused Assessments Cardiac Assessment Handoff:  Cardiac Rhythm: Atrial fibrillation   Does the Patient currently have chest pain? No    R Recommendations: See Admitting Provider Note  Report given to:   Additional Notes:

## 2023-03-13 NOTE — H&P (Addendum)
History and Physical    Tammy Manning:[id number] DOB: [birth year] DOA: 03/12/2023  Referring MD/NP/PA:   PCP: [name], MD   Patient coming from:  The patient is coming from home.     Chief Complaint: Abdominal pain  HPI: Tammy Manning is a 88 y.o. female with medical history significant of hypertension, hyperlipidemia, dCHF, TIA, GERD, PAF on Eliquis, hard of hearing, aortic valve insufficiency, IBS, lactose intolerance, rheumatoid arthritis, recurrent UTI, who presents with abdominal pain.  Patient states that she has abdominal pain for almost 2 weeks.  The pain is located in the left side and lower abdomen, intermittent, cramping, mild to moderate, nonradiating, not elevated or aggravated by any known factors.  Associate with nausea, vomiting, diarrhea.  No bloody diarrhea.  She states that she has several watery diarrhea each day. She has mostly dry heaves, occasionally with nonbilious nonbloody vomiting.  She has chills, but no fever.  Denies chest pain, cough, shortness of breath.  Of note, patient has recurrent UTI, had multiple treatments with antibiotics, recent antibiotic treatment was 2 weeks ago.  No symptoms of UTI now.  Data reviewed independently and ED Course: pt was found to have WBC 8.1, GFR> 60, urinalysis (clear appearance, trace amount of leukocyte, negative bacteria, WBC 0-5), temperature 97.5, blood pressure 135/50, heart rate 81, RR 18, oxygen saturation 94% on room air.  CT abdomen/pelvis showed sigmoid colon colitis. Patient is admitted to MedSurg bed as inpatient.  Dr. Allegra Lai of GI is consulted.  CT-abd/pelvis 1. Severe sigmoid diverticulosis. Long segment mural thickening involving the sigmoid colon may relate to underlying muscular hypertrophy though a infectious or inflammatory colitis in this region could appear similarly. No evidence of obstruction or perforation. 2.  Aortic Atherosclerosis (ICD10-I70.0).   EKG: I have personally reviewed.   Sinus rhythm, QTc 424, frequent PVC, low voltage   Review of Systems:   General: no fevers, has chills, no body weight gain, has poor appetite, has fatigue HEENT: no blurry vision, hearing changes or sore throat Respiratory: no dyspnea, coughing, wheezing CV: no chest pain, no palpitations GI: has nausea, vomiting, abdominal pain, diarrhea GU: no dysuria, burning on urination, increased urinary frequency, hematuria  Ext: no leg edema Neuro: no unilateral weakness, numbness, or tingling, no vision change or hearing loss Skin: no rash, no skin tear. MSK: No muscle spasm, no deformity, no limitation of range of movement in spin Heme: No easy bruising.  Travel history: No recent long distant travel.   Allergy:  Allergies  Allergen Reactions   Antihistamines, Chlorpheniramine-Type Other (See Comments)    Reaction:  Makes pt hyper    Atenolol Hypertension   Cefuroxime Axetil Other (See Comments)    Upset stomach   Ciprofloxacin Other (See Comments)    Upset stomach   Co Q 10 [Coenzyme Q10] Other (See Comments)    Reaction:  GI upset    Crestor [Rosuvastatin Calcium] Other (See Comments)    Reaction:  Myalgias    Dextromethorphan-Guaifenesin Other (See Comments)    Reaction:  Made pt jittery    Epinephrine Hives   Ramipril Other (See Comments)    Upset stomach   Sulfamethoxazole-Trimethoprim Other (See Comments)    Upset stomach   Vitamin D Analogs Other (See Comments)    Reaction:  GI upset     Past Medical History:  Diagnosis Date   A-fib    Anginal pain    Aortic insufficiency    a. 02/2020 Echo: EF 60-65%, no rwma, Gr1 DD,  nl RV fxn, mildly dil LA, triv AI, Asc Ao 39mm; b. 04/2021 Echo: EF 60-65%, no rwma, GrI DD, nl RV fxn, mildly dil LA, AI not visualized. Mild-mod AoV sclerosis w/o stenosis.   Arthritis    Knees   B12 deficiency    Mild   Bucket-handle tear of lateral meniscus of left knee as current injury 12/30/2012   Cancer    Skin, basal cell on nose and  eyelid   DJD (degenerative joint disease)    Low back   Dyspepsia    GERD (gastroesophageal reflux disease)    Heart murmur    Heart palpitations    History of cardiac cath    a. 02/2017 Cath: Nl cors. Nl LV fxn. Abd Aogram w/o RAS.   HOH (hard of hearing)    Hyperlipidemia    Hypertension    IBS (irritable bowel syndrome)    Lactose intolerance    Mixed incontinence    PONV (postoperative nausea and vomiting)    Vulvar irritation    from urine pad, uses Triamcinolone oint.    Past Surgical History:  Procedure Laterality Date   ABDOMINAL AORTOGRAM N/A 03/15/2017   Procedure: Abdominal Aortogram;  Surgeon: Iran Ouch, MD;  Location: [facility];  Service: Cardiovascular;  Laterality: N/A;   ABDOMINAL HYSTERECTOMY  1982   Large fibroids and bleeding   APPENDECTOMY     BILATERAL SALPINGOOPHORECTOMY  1986   BONE GRAFT HIP ILIAC CREST     To Left arm   BREAST REDUCTION SURGERY     BREAST SURGERY     CARDIAC CATHETERIZATION     CATARACT EXTRACTION W/PHACO Left 07/07/2016   Procedure: CATARACT EXTRACTION PHACO AND INTRAOCULAR LENS PLACEMENT (IOC);  Surgeon: Galen Manila, MD;  Location: [facility];  Service: Ophthalmology;  Laterality: Left;  [place] 01:10 AP% 18.3 CDE 12.91 Fluid pak lot # [id number]   CATARACT EXTRACTION W/PHACO Right 07/28/2016   Procedure: CATARACT EXTRACTION PHACO AND INTRAOCULAR LENS PLACEMENT (IOC);  Surgeon: Galen Manila, MD;  Location: [facility];  Service: Ophthalmology;  Laterality: Right;  [place] 01:26 AP% 18.5 CDE 16.12 Fluid pack lot # [id number]   COLONOSCOPY  07/2016   Dr. Lottie Mussel   DILATION AND CURETTAGE OF UTERUS     miscarragesx2   ESOPHAGOGASTRODUODENOSCOPY     Polyps   HEMORRHOID SURGERY  2006   KNEE ARTHROSCOPY WITH LATERAL MENISECTOMY Left 12/30/2012   Procedure: KNEE ARTHROSCOPY WITH LATERAL MENISECTOMY;  Surgeon: Eulas Post, MD;  Location: [facility];  Service: Orthopedics;  Laterality: Left;  LEFT KNEE SCOPE  LATERAL MENISCECTOMY   LEFT HEART CATH AND CORONARY ANGIOGRAPHY N/A 03/15/2017   Procedure: Left Heart Cath and Coronary Angiography;  Surgeon: Iran Ouch, MD;  Location: [facility];  Service: Cardiovascular;  Laterality: N/A;   MOUTH SURGERY     skin cancer eyelid  2012   TONSILLECTOMY      Social History:  reports that she quit smoking about 59 years ago. Her smoking use included cigarettes. She has a 12.00 pack-year smoking history. She has never used smokeless tobacco. She reports that she does not drink alcohol and does not use drugs.  Family History:  Family History  Problem Relation Age of Onset   Cancer Mother        Colon   Heart disease Father        CAD   Diabetes Sister    Hypothyroidism Sister    Diabetes Brother  Leukemia Brother    Esophageal cancer Brother    Cancer Paternal Aunt        Breast   Diabetes Sister    Alzheimer's disease Sister    Diabetes Brother    Breast cancer Daughter      Prior to Admission medications   Medication Sig Start Date End Date Taking? Authorizing Provider  acetaminophen (TYLENOL) 325 MG suppository Place 325 mg rectally every 8 (eight) hours as needed.    [provider]  atorvastatin (LIPITOR) 10 MG tablet TAKE 1 TABLET(10 MG) BY MOUTH DAILY 02/02/23   [name], Audrie Gallus, MD  Calcium Carbonate-Vitamin D (CALCIUM 600+D PO) Take 1 tablet by mouth daily.     [provider]  clobetasol ointment (TEMOVATE) 0.05 % Apply 1 application topically 2 (two) times daily as needed.    [provider]  clorazepate (TRANXENE) 7.5 MG tablet Take 1 tablet (7.5 mg total) by mouth daily as needed. 12/15/22   [name], Audrie Gallus, MD  cyanocobalamin (,VITAMIN B-12,) 1000 MCG/ML injection Inject 1,000 mcg into the muscle every 30 (thirty) days.  12/16/16   [provider]  diltiazem (CARDIZEM CD) 180 MG 24 hr capsule Take 1 capsule (180 mg total) by mouth daily. 06/23/22 03/12/23  Dunn, Raymon Mutton, PA-C  ELIQUIS 5 MG  TABS tablet TAKE 1 TABLET BY MOUTH TWICE DAILY 02/18/23   Iran Ouch, MD  estradiol (ESTRACE) 0.1 MG/GM vaginal cream Apply a pea-sized amount to fingertip and wipe in vaginal introitus twice weekly 01/20/23   Michiel Cowboy A, PA-C  famotidine (PEPCID) 20 MG tablet Take 20 mg by mouth 2 (two) times daily as needed for heartburn or indigestion.    [provider]  guaiFENesin (MUCINEX) 600 MG 12 hr tablet Take 600 mg by mouth 2 (two) times daily as needed.    [provider]  hydrocortisone (ANUSOL-HC) 2.5 % rectal cream Apply 1 Application topically at bedtime. To affected area for 10 days 02/02/23   [name], Audrie Gallus, MD  irbesartan (AVAPRO) 300 MG tablet TAKE 1 TABLET BY MOUTH DAILY 02/02/23   [name], Audrie Gallus, MD  ketoconazole (NIZORAL) 2 % cream Apply 1 Application topically daily. To affected area 02/16/23   [name], Audrie Gallus, MD  loperamide (IMODIUM) 2 MG capsule Take 2 mg by mouth as needed for diarrhea or loose stools.    [provider]  MAGNESIUM LACTATE PO Take by mouth 2 (two) times daily.    [provider]  methotrexate 50 MG/2ML injection Inject 0.6 mLs (15 mg total) into the skin once a week. 12/30/22   Rice, Jamesetta Orleans, MD  nitrofurantoin, macrocrystal-monohydrate, (MACROBID) 100 MG capsule Take 1 capsule (100 mg total) by mouth 2 (two) times daily. 02/25/23   Karie Schwalbe, MD  raNITIdine HCl (ZANTAC PO) Take by mouth.    [provider]  triamcinolone (NASACORT) 55 MCG/ACT AERO nasal inhaler Place 2 sprays into the nose daily. 01/23/22   [name], Audrie Gallus, MD  TUBERCULIN SYR 1CC/26GX3/8" 26G X 3/8" 1 ML MISC For injection of subcutaneous methotrexate once weekly 12/30/22   Rice, Jamesetta Orleans, MD  Tuberculin-Allergy Syringes 27G X 1/2" 1 ML MISC Use 1 syringe once weekly to inject methotrexate. 12/30/22   Fuller Plan, MD    Physical Exam: Vitals:   03/13/23 0300 03/13/23 0345 03/13/23 0500 03/13/23 0550  BP: (!) 132/51 (!) 135/55  115/74 (!) 135/50  Pulse: 81 78 77 80  Resp: 18  Temp: 98.2 F (36.8 C)  98.1 F (36.7 C) (!) 97.5 F (36.4 C)  TempSrc: Oral  Oral   SpO2: 93% 93% 93% 94%  Weight:   82.1 kg   Height:   5\' 4"  (1.626 m)    General: Not in acute distress HEENT:       Eyes: PERRL, EOMI, no scleral icterus.       ENT: No discharge from the ears and nose, no pharynx injection, no tonsillar enlargement.        Neck: No JVD, no bruit, no mass felt. Heme: No neck lymph node enlargement. Cardiac: S1/S2, RRR, No murmurs, No gallops or rubs. Respiratory: No rales, wheezing, rhonchi or rubs. GI: Soft, nondistended, has tenderness in left side and lower abdomen, no rebound pain, no organomegaly, BS present. GU: No hematuria Ext: No pitting leg edema bilaterally. 1+DP/PT pulse bilaterally. Musculoskeletal: No joint deformities, No joint redness or warmth, no limitation of ROM in spin. Skin: No rashes.  Neuro: Alert, oriented X3, cranial nerves II-XII grossly intact, moves all extremities normally.  Psych: Patient is not psychotic, no suicidal or hemocidal ideation.  Labs on Admission: I have personally reviewed following labs and imaging studies  CBC: Recent Labs  Lab 03/13/23 0127  WBC 8.1  HGB 11.5*  HCT 34.9*  MCV 101.2*  PLT 323   Basic Metabolic Panel: Recent Labs  Lab 03/13/23 0127  NA 135  K 3.8  CL 104  CO2 25  GLUCOSE 110*  BUN 16  CREATININE 0.73  CALCIUM 9.2   GFR: Estimated Creatinine Clearance: 53.3 mL/min (by C-G formula based on SCr of 0.73 mg/dL). Liver Function Tests: Recent Labs  Lab 03/13/23 0127  AST 19  ALT 16  ALKPHOS 45  BILITOT 0.8  PROT 6.0*  ALBUMIN 3.5   Recent Labs  Lab 03/13/23 0127  LIPASE 23   No results for input(s): "AMMONIA" in the last 168 hours. Coagulation Profile: No results for input(s): "INR", "PROTIME" in the last 168 hours. Cardiac Enzymes: No results for input(s): "CKTOTAL", "CKMB", "CKMBINDEX", "TROPONINI" in the last  168 hours. BNP (last 3 results) No results for input(s): "PROBNP" in the last 8760 hours. HbA1C: No results for input(s): "HGBA1C" in the last 72 hours. CBG: No results for input(s): "GLUCAP" in the last 168 hours. Lipid Profile: No results for input(s): "CHOL", "HDL", "LDLCALC", "TRIG", "CHOLHDL", "LDLDIRECT" in the last 72 hours. Thyroid Function Tests: No results for input(s): "TSH", "T4TOTAL", "FREET4", "T3FREE", "THYROIDAB" in the last 72 hours. Anemia Panel: No results for input(s): "VITAMINB12", "FOLATE", "FERRITIN", "TIBC", "IRON", "RETICCTPCT" in the last 72 hours. Urine analysis:    Component Value Date/Time   COLORURINE STRAW (A) 03/13/2023 0127   APPEARANCEUR CLEAR (A) 03/13/2023 0127   APPEARANCEUR CANCELED 02/09/2023 1121   LABSPEC 1.014 03/13/2023 0127   PHURINE 5.0 03/13/2023 0127   GLUCOSEU NEGATIVE 03/13/2023 0127   HGBUR SMALL (A) 03/13/2023 0127   BILIRUBINUR NEGATIVE 03/13/2023 0127   BILIRUBINUR negative 02/25/2023 1013   BILIRUBINUR Negative 02/09/2023 1121   KETONESUR NEGATIVE 03/13/2023 0127   PROTEINUR NEGATIVE 03/13/2023 0127   UROBILINOGEN 0.2 02/25/2023 1013   NITRITE NEGATIVE 03/13/2023 0127   LEUKOCYTESUR TRACE (A) 03/13/2023 0127   Sepsis Labs: @LABRCNTIP (procalcitonin:4,lacticidven:4) )No results found for this or any previous visit (from the past 240 hour(s)).   Radiological Exams on Admission: CT ABDOMEN PELVIS W CONTRAST  Result Date: 03/13/2023 CLINICAL DATA:  Lower abdominal pain, recurrent diarrhea, recurrent urinary tract infection EXAM: CT ABDOMEN AND PELVIS  WITH CONTRAST TECHNIQUE: Multidetector CT imaging of the abdomen and pelvis was performed using the standard protocol following bolus administration of intravenous contrast. RADIATION DOSE REDUCTION: This exam was performed according to the departmental dose-optimization program which includes automated exposure control, adjustment of the mA and/or kV according to patient size and/or  use of iterative reconstruction technique. CONTRAST:  OMNIPAQUE IOHEXOL 300 MG/ML  SOLN COMPARISON:  None Available. FINDINGS: Lower chest: Calcification of the aortic valve leaflets noted. Cardiac size within normal limits. No pericardial effusion. Visualized lung bases are clear. Hepatobiliary: No focal liver abnormality is seen. No gallstones, gallbladder wall thickening, or biliary dilatation. Pancreas: Unremarkable Spleen: Unremarkable Adrenals/Urinary Tract: The adrenal glands are unremarkable. Mild bilateral renal cortical atrophy. The kidneys are otherwise unremarkable. The bladder is unremarkable. Stomach/Bowel: Surgical staple line related to colo-anal anastomosis noted. Severe sigmoid diverticulosis. Long segment mural thickening involving the sigmoid colon may relate to underlying muscular hypertrophy though a infectious or inflammatory colitis in this region could appear similarly. The stomach, small bowel, and large bowel are otherwise unremarkable. No evidence of obstruction. No free intraperitoneal gas or fluid. Appendix absent. Vascular/Lymphatic: Extensive aortoiliac atherosclerotic calcification. No aortic aneurysm. No pathologic adenopathy within the abdomen and pelvis. Reproductive: Status post hysterectomy. No adnexal masses. Other: No abdominal wall hernia. Musculoskeletal: Degenerative changes seen at the lumbosacral junction with grade 1 anterolisthesis L4-5. No acute bone abnormality. No lytic or blastic bone lesion. IMPRESSION: 1. Severe sigmoid diverticulosis. Long segment mural thickening involving the sigmoid colon may relate to underlying muscular hypertrophy though a infectious or inflammatory colitis in this region could appear similarly. No evidence of obstruction or perforation. 2.  Aortic Atherosclerosis (ICD10-I70.0). Electronically Signed   By: Helyn Numbers M.D.   On: 03/13/2023 02:39      Assessment/Plan Principal Problem:   Acute colitis Active Problems:   PAF  (paroxysmal atrial fibrillation)   Essential hypertension   Hyperlipidemia   History of TIA (transient ischemic attack)   Seropositive rheumatoid arthritis   Chronic diastolic CHF (congestive heart failure)   Obesity (BMI 30-39.9)   Assessment and Plan:  Acute colitis: CT showed possible sigmoid colitis.  Patient does not have fever or leukocytosis.  Clinically not septic.  Hemodynamically stable.  Patient has history of recurrent UTI, has used multiple antibiotics in the past.  Will need to rule out C. difficile.  -Admitted to MedSurg bed as inpatient -Antibiotics: Flagyl and Rocephin (the patient received aztreonam in ED) -As needed morphine, Percocet, Tylenol for pain -Check C. difficile and GI pathogen panel -Consulted Dr. Allegra Lai for GI  PAF (paroxysmal atrial fibrillation): Heart rate 80s -Continue Cardizem and Eliquis  Essential hypertension -IV hydralazine as needed -Cardizem and irbesartan  Hyperlipidemia -Lipitor  History of TIA (transient ischemic attack) -Lipitor -Patient is on Eliquis for A-fib  Seropositive rheumatoid arthritis -Patient is on weekly methotrexate  Chronic diastolic CHF (congestive heart failure): 2D echo on 04/29/2021 showed EF 60 to 65% with grade 1 diastolic dysfunction.  Patient does not have leg edema JVD.  No shortness of breath.  CHF is compensated. -Check BNP  Obesity (BMI 30-39.9): Body weight 82.1 kg, BMI 31.07 -Healthy diet and exercise -Encourage losing weight        DVT ppx: on Eliquis  Code Status: DNR (I discussed with patient and explained the meaning of CODE STATUS. Patient wants to be DNR)  Family Communication:   Yes, patient's husband by phone  Disposition Plan:  Anticipate discharge back to previous environment  Consults called:  Dr. Allegra Lai of GI is consulted.  Admission status and Level of care: Med-Surg:    as inpt      Dispo: The patient is from: Home              Anticipated d/c is to: Home               Anticipated d/c date is: 2 days              Patient currently is not medically stable to d/c.    Severity of Illness:  The appropriate patient status for this patient is INPATIENT. Inpatient status is judged to be reasonable and necessary in order to provide the required intensity of service to ensure the patient's safety. The patient's presenting symptoms, physical exam findings, and initial radiographic and laboratory data in the context of their chronic comorbidities is felt to place them at high risk for further clinical deterioration. Furthermore, it is not anticipated that the patient will be medically stable for discharge from the hospital within 2 midnights of admission.   * I certify that at the point of admission it is my clinical judgment that the patient will require inpatient hospital care spanning beyond 2 midnights from the point of admission due to high intensity of service, high risk for further deterioration and high frequency of surveillance required.*        Date of Service 03/13/2023    Lorretta Harp [facility]   If 7PM-7AM, please contact night-coverage [email or web address] 03/13/2023, 8:02 AM

## 2023-03-13 NOTE — Plan of Care (Signed)
°  Problem: Education: °Goal: Knowledge of General Education information will improve °Description: Including pain rating scale, medication(s)/side effects and non-pharmacologic comfort measures °Outcome: Progressing °  °Problem: Clinical Measurements: °Goal: Cardiovascular complication will be avoided °Outcome: Progressing °  °Problem: Activity: °Goal: Risk for activity intolerance will decrease °Outcome: Progressing °  °Problem: Coping: °Goal: Level of anxiety will decrease °Outcome: Progressing °  °

## 2023-03-13 NOTE — Consult Note (Signed)
Pharmacy Antibiotic Note  Tammy Manning is a 88 y.o. female admitted on 03/12/2023 with sigmoid colon colitis. PMH significant for HTN, HLD,  dCHF, TIA, GERD, PAF on Eliquis, hard of hearing, aortic valve insufficiency, IBS, lactose intolerance, rheumatoid arthritis, recurrent UTI. Patient has no documented allergies to antibiotics. Patient has recurrent UTI with multiple antibiotic treatments, most recently 2 weeks ago (macrobid). Pharmacy has been consulted for aztreonam dosing.  Plan: Day 1 of antibiotics Discontinue IM aztreonam Start aztreonam 2 g IV Q8H Patient is also on metronidazole 500 mg IV Q8H Continue to monitor renal function and follow culture results   Height:  (162.6 cm) Weight: 82.1 kg (181 lb) IBW/kg (Calculated) : 54.7  Temp (24hrs), Avg:98.1 F (36.7 C), Min:97.5 F (36.4 C), Max:98.4 F (36.9 C)  Recent Labs  Lab 03/13/23 0127  WBC 8.1  CREATININE 0.73    Estimated Creatinine Clearance: 53.3 mL/min (by C-G formula based on SCr of 0.73 mg/dL).    Allergies  Allergen Reactions   Antihistamines, Chlorpheniramine-Type Other (See Comments)    Reaction:  Makes pt hyper    Atenolol Hypertension   Cefuroxime Axetil Other (See Comments)    Upset stomach   Ciprofloxacin Other (See Comments)    Upset stomach   Co Q 10 [Coenzyme Q10] Other (See Comments)    Reaction:  GI upset    Crestor [Rosuvastatin Calcium] Other (See Comments)    Reaction:  Myalgias    Dextromethorphan-Guaifenesin Other (See Comments)    Reaction:  Made pt jittery    Epinephrine Hives   Ramipril Other (See Comments)    Upset stomach   Sulfamethoxazole-Trimethoprim Other (See Comments)    Upset stomach   Vitamin D Analogs Other (See Comments)    Reaction:  GI upset     Antimicrobials this admission: 4/19 Aztreonam >>  4/20 Metronidazole >>   Dose adjustments this admission: N/A  Microbiology results: 4/20 UCx: IP  4/20 C. diff Quick screen: ordered 4/20 GIPP:  ordered  Thank you for allowing pharmacy to be a part of this patient's care.  Celene Squibb, PharmD PGY1 Pharmacy Resident 03/13/2023 8:02 AM

## 2023-03-14 DIAGNOSIS — K529 Noninfective gastroenteritis and colitis, unspecified: Secondary | ICD-10-CM | POA: Diagnosis not present

## 2023-03-14 DIAGNOSIS — I480 Paroxysmal atrial fibrillation: Secondary | ICD-10-CM

## 2023-03-14 DIAGNOSIS — I10 Essential (primary) hypertension: Secondary | ICD-10-CM | POA: Diagnosis not present

## 2023-03-14 LAB — BASIC METABOLIC PANEL
Anion gap: 7 (ref 5–15)
BUN: 14 mg/dL (ref 8–23)
CO2: 23 mmol/L (ref 22–32)
Calcium: 8.4 mg/dL — ABNORMAL LOW (ref 8.9–10.3)
Chloride: 107 mmol/L (ref 98–111)
Creatinine, Ser: 0.79 mg/dL (ref 0.44–1.00)
GFR, Estimated: >60 mL/min (ref >60)
Glucose, Bld: 94 mg/dL (ref 70–99)
Potassium: 3.8 mmol/L (ref 3.5–5.1)
Sodium: 137 mmol/L (ref 135–145)

## 2023-03-14 LAB — CBC
HCT: 30 % — ABNORMAL LOW (ref 36.0–46.0)
Hemoglobin: 9.7 g/dL — ABNORMAL LOW (ref 12.0–15.0)
MCH: 32.9 pg (ref 26.0–34.0)
MCHC: 32.3 g/dL (ref 30.0–36.0)
MCV: 101.7 fL — ABNORMAL HIGH (ref 80.0–100.0)
Platelets: 280 10*3/uL (ref 150–400)
RBC: 2.95 MIL/uL — ABNORMAL LOW (ref 3.87–5.11)
RDW: 15.9 % — ABNORMAL HIGH (ref 11.5–15.5)
WBC: 6.6 10*3/uL (ref 4.0–10.5)
nRBC: 0 % (ref 0.0–0.2)

## 2023-03-14 LAB — URINE CULTURE

## 2023-03-14 MED ORDER — METRONIDAZOLE 500 MG PO TABS: 500.0000 mg | ORAL_TABLET | Freq: Two times a day (BID) | ORAL | 0 refills | Status: AC

## 2023-03-14 NOTE — Plan of Care (Signed)

## 2023-03-14 NOTE — Discharge Summary (Addendum)
Physician Discharge Summary  Tammy Manning ZOX:[id number] DOB: Aug 22, 1935 DOA: 03/12/2023  PCP: [name], MD  Admit date: 03/12/2023 Discharge date: 03/14/2023  Admitted From: home  Disposition:  home   Recommendations for Outpatient Follow-up:  Follow up with PCP in 1-2 weeks F/u w/ GI, Dr. Allegra Lai, in 1-2 weeks   Home Health: no  Equipment/Devices:  Discharge Condition: stable  CODE STATUS:DNR Diet recommendation: soft diet and advance as tolerated    Brief/Interim Summary: HPI was taken from Dr. Clyde Lundborg: Tammy Manning is a 88 y.o. female with medical history significant of hypertension, hyperlipidemia, dCHF, TIA, GERD, PAF on Eliquis, hard of hearing, aortic valve insufficiency, IBS, lactose intolerance, rheumatoid arthritis, recurrent UTI, who presents with abdominal pain.   Patient states that she has abdominal pain for almost 2 weeks.  The pain is located in the left side and lower abdomen, intermittent, cramping, mild to moderate, nonradiating, not elevated or aggravated by any known factors.  Associate with nausea, vomiting, diarrhea.  No bloody diarrhea.  She states that she has several watery diarrhea each day. She has mostly dry heaves, occasionally with nonbilious nonbloody vomiting.  She has chills, but no fever.  Denies chest pain, cough, shortness of breath.  Of note, patient has recurrent UTI, had multiple treatments with antibiotics, recent antibiotic treatment was 2 weeks ago.  No symptoms of UTI now.   Data reviewed independently and ED Course: pt was found to have WBC 8.1, GFR> 60, urinalysis (clear appearance, trace amount of leukocyte, negative bacteria, WBC 0-5), temperature 97.5, blood pressure 135/50, heart rate 81, RR 18, oxygen saturation 94% on room air.  CT abdomen/pelvis showed sigmoid colon colitis. Patient is admitted to MedSurg bed as inpatient.  Dr. Allegra Lai of GI is consulted.   CT-abd/pelvis 1. Severe sigmoid diverticulosis. Long segment mural  thickening involving the sigmoid colon may relate to underlying muscular hypertrophy though a infectious or inflammatory colitis in this region could appear similarly. No evidence of obstruction or perforation. 2.  Aortic Atherosclerosis   Discharge Diagnoses:  Principal Problem:   Acute colitis Active Problems:   PAF (paroxysmal atrial fibrillation)   Essential hypertension   Hyperlipidemia   History of TIA (transient ischemic attack)   Seropositive rheumatoid arthritis   Chronic diastolic CHF (congestive heart failure)   Obesity (BMI 30-39.9)   Lower abdominal pain  Acute colitis: CT showed possible sigmoid colitis. Continue on IV flagyl, rocephin. GI PCR panel, c. diff are both neg. Will get colonoscopy outpatient as per GI.  D/c home on po flagyl to complete the course. GI following and recs apprec    PAF: continue on cardizem, eliquis   HTN: continue on home dose of cardizem, irbesartan. IV hydralazine prn    HLD: continue on statin   Hx of TIA: continue on statin    Seropositive rheumatoid arthritis: continue on home dose of methotrexate    Chronic diastolic CHF: echo on 04/29/2021 showed EF 60 to 65% with grade 1 diastolic dysfunction. CHF is compensated. Monitor I/Os   Obesity: BMI 31.0. Would benefit from weight loss  Discharge Instructions  Discharge Instructions     Diet general   Complete by: As directed    Soft diet initially and advance as tolerated   Discharge instructions   Complete by: As directed    F/u w/ PCP in 1-2 weeks. F/u w/ GI, Dr. Allegra Lai, in 1-2 weeks   Increase activity slowly   Complete by: As directed       Allergies  as of 03/14/2023       Reactions   Antihistamines, Chlorpheniramine-type Other (See Comments)   Reaction:  Makes pt hyper    Atenolol Hypertension   Cefuroxime Axetil Other (See Comments)   Upset stomach   Ciprofloxacin Other (See Comments)   Upset stomach   Co Q 10 [coenzyme Q10] Other (See Comments)   Reaction:  GI  upset    Crestor [rosuvastatin Calcium] Other (See Comments)   Reaction:  Myalgias   Dextromethorphan-guaifenesin Other (See Comments)   Reaction:  Made pt jittery    Epinephrine Hives   Ramipril Other (See Comments)   Upset stomach   Sulfamethoxazole-trimethoprim Other (See Comments)   Upset stomach   Vitamin D Analogs Other (See Comments)   Reaction:  GI upset         Medication List     STOP taking these medications    nitrofurantoin (macrocrystal-monohydrate) 100 MG capsule Commonly known as: MACROBID   ZANTAC PO       TAKE these medications    acetaminophen 325 MG suppository Commonly known as: TYLENOL Place 325 mg rectally every 8 (eight) hours as needed.   atorvastatin 10 MG tablet Commonly known as: LIPITOR TAKE 1 TABLET(10 MG) BY MOUTH DAILY What changed: See the new instructions.   CALCIUM 600+D PO Take 1 tablet by mouth daily.   clobetasol ointment 0.05 % Commonly known as: TEMOVATE Apply 1 application topically 2 (two) times daily as needed.   clorazepate 7.5 MG tablet Commonly known as: TRANXENE Take 1 tablet (7.5 mg total) by mouth daily as needed.   cyanocobalamin 1000 MCG/ML injection Commonly known as: VITAMIN B12 Inject 1,000 mcg into the muscle every 30 (thirty) days.   diltiazem 180 MG 24 hr capsule Commonly known as: CARDIZEM CD Take 1 capsule (180 mg total) by mouth daily.   Eliquis 5 MG Tabs tablet Generic drug: apixaban TAKE 1 TABLET BY MOUTH TWICE DAILY   estradiol 0.1 MG/GM vaginal cream Commonly known as: ESTRACE Apply a pea-sized amount to fingertip and wipe in vaginal introitus twice weekly   famotidine 20 MG tablet Commonly known as: PEPCID Take 20 mg by mouth 2 (two) times daily as needed for heartburn or indigestion.   guaiFENesin 600 MG 12 hr tablet Commonly known as: MUCINEX Take 600 mg by mouth 2 (two) times daily as needed.   hydrocortisone 2.5 % rectal cream Commonly known as: Anusol-HC Apply 1  Application topically at bedtime. To affected area for 10 days   irbesartan 300 MG tablet Commonly known as: AVAPRO TAKE 1 TABLET BY MOUTH DAILY What changed:  how much to take how to take this when to take this additional instructions   ketoconazole 2 % cream Commonly known as: NIZORAL Apply 1 Application topically daily. To affected area   loperamide 2 MG capsule Commonly known as: IMODIUM Take 2 mg by mouth as needed for diarrhea or loose stools.   MAGNESIUM LACTATE PO Take by mouth 2 (two) times daily.   methotrexate 50 MG/2ML injection Inject 0.6 mLs (15 mg total) into the skin once a week.   metroNIDAZOLE 500 MG tablet Commonly known as: Flagyl Take 1 tablet (500 mg total) by mouth 2 (two) times daily for 4 days.   triamcinolone 55 MCG/ACT Aero nasal inhaler Commonly known as: NASACORT Place 2 sprays into the nose daily.   TUBERCULIN SYR 1CC/26GX3/8" 26G X 3/8" 1 ML Misc For injection of subcutaneous methotrexate once weekly   Tuberculin-Allergy Syringes 27G X 1/2"  1 ML Misc Use 1 syringe once weekly to inject methotrexate.        Allergies  Allergen Reactions   Antihistamines, Chlorpheniramine-Type Other (See Comments)    Reaction:  Makes pt hyper    Atenolol Hypertension   Cefuroxime Axetil Other (See Comments)    Upset stomach   Ciprofloxacin Other (See Comments)    Upset stomach   Co Q 10 [Coenzyme Q10] Other (See Comments)    Reaction:  GI upset    Crestor [Rosuvastatin Calcium] Other (See Comments)    Reaction:  Myalgias    Dextromethorphan-Guaifenesin Other (See Comments)    Reaction:  Made pt jittery    Epinephrine Hives   Ramipril Other (See Comments)    Upset stomach   Sulfamethoxazole-Trimethoprim Other (See Comments)    Upset stomach   Vitamin D Analogs Other (See Comments)    Reaction:  GI upset     Consultations: GI    Procedures/Studies: CT ABDOMEN PELVIS W CONTRAST  Result Date: 03/13/2023 CLINICAL DATA:  Lower abdominal  pain, recurrent diarrhea, recurrent urinary tract infection EXAM: CT ABDOMEN AND PELVIS WITH CONTRAST TECHNIQUE: Multidetector CT imaging of the abdomen and pelvis was performed using the standard protocol following bolus administration of intravenous contrast. RADIATION DOSE REDUCTION: This exam was performed according to the departmental dose-optimization program which includes automated exposure control, adjustment of the mA and/or kV according to patient size and/or use of iterative reconstruction technique. CONTRAST:  OMNIPAQUE IOHEXOL 300 MG/ML  SOLN COMPARISON:  None Available. FINDINGS: Lower chest: Calcification of the aortic valve leaflets noted. Cardiac size within normal limits. No pericardial effusion. Visualized lung bases are clear. Hepatobiliary: No focal liver abnormality is seen. No gallstones, gallbladder wall thickening, or biliary dilatation. Pancreas: Unremarkable Spleen: Unremarkable Adrenals/Urinary Tract: The adrenal glands are unremarkable. Mild bilateral renal cortical atrophy. The kidneys are otherwise unremarkable. The bladder is unremarkable. Stomach/Bowel: Surgical staple line related to colo-anal anastomosis noted. Severe sigmoid diverticulosis. Long segment mural thickening involving the sigmoid colon may relate to underlying muscular hypertrophy though a infectious or inflammatory colitis in this region could appear similarly. The stomach, small bowel, and large bowel are otherwise unremarkable. No evidence of obstruction. No free intraperitoneal gas or fluid. Appendix absent. Vascular/Lymphatic: Extensive aortoiliac atherosclerotic calcification. No aortic aneurysm. No pathologic adenopathy within the abdomen and pelvis. Reproductive: Status post hysterectomy. No adnexal masses. Other: No abdominal wall hernia. Musculoskeletal: Degenerative changes seen at the lumbosacral junction with grade 1 anterolisthesis L4-5. No acute bone abnormality. No lytic or blastic bone lesion.  IMPRESSION: 1. Severe sigmoid diverticulosis. Long segment mural thickening involving the sigmoid colon may relate to underlying muscular hypertrophy though a infectious or inflammatory colitis in this region could appear similarly. No evidence of obstruction or perforation. 2.  Aortic Atherosclerosis (ICD10-I70.0). Electronically Signed   By: Helyn Numbers M.D.   On: 03/13/2023 02:39   (Echo, Carotid, EGD, Colonoscopy, ERCP)    Subjective: Pt c/o fatigue. Pt denies diarrhea so far today    Discharge Exam: Vitals:   03/14/23 0756 03/14/23 1050  BP: (!) 105/54 (!) 112/46  Pulse: 67 77  Resp: 18   Temp: 98.1 F (36.7 C)   SpO2: 97%    Vitals:   03/13/23 1543 03/13/23 2330 03/14/23 0756 03/14/23 1050  BP: (!) 121/51 (!) 122/51 (!) 105/54 (!) 112/46  Pulse: 81 78 67 77  Resp: 19 20 18    Temp: 98.9 F (37.2 C) 99.7 F (37.6 C) 98.1 F (36.7 C)  TempSrc:      SpO2: 93% 95% 97%   Weight:      Height:        General: Pt is alert, awake, not in acute distress Cardiovascular:  S1/S2 +, no rubs, no gallops Respiratory: CTA bilaterally, no wheezing, no rhonchi Abdominal: Soft, NT, ND, bowel sounds + Extremities: no edema, no cyanosis    The results of significant diagnostics from this hospitalization (including imaging, microbiology, ancillary and laboratory) are listed below for reference.     Microbiology: Recent Results (from the past 240 hour(s))  Urine Culture     Status: Abnormal   Collection Time: 03/13/23  1:27 AM   Specimen: Urine, Clean Catch  Result Value Ref Range Status   Specimen Description   Final    URINE, CLEAN CATCH Performed at [facility], [address] [place], [place] [address]    Special Requests   Final    NONE Performed at [facility], [address] [place], [place] [address]    Culture MULTIPLE SPECIES PRESENT, SUGGEST RECOLLECTION (A)  Final   Report Status 03/14/2023 FINAL  Final  C Difficile Quick Screen w PCR  reflex     Status: None   Collection Time: 03/13/23  9:00 AM   Specimen: STOOL  Result Value Ref Range Status   C Diff antigen NEGATIVE NEGATIVE Final   C Diff toxin NEGATIVE NEGATIVE Final   C Diff interpretation No C. difficile detected.  Final    Comment: Performed at [facility], [address] [place], [place] [address]  Gastrointestinal Panel by PCR , Stool     Status: None   Collection Time: 03/13/23  9:00 AM   Specimen: STOOL  Result Value Ref Range Status   Campylobacter species NOT DETECTED NOT DETECTED Final   Plesimonas shigelloides NOT DETECTED NOT DETECTED Final   Salmonella species NOT DETECTED NOT DETECTED Final   Yersinia enterocolitica NOT DETECTED NOT DETECTED Final   Vibrio species NOT DETECTED NOT DETECTED Final   Vibrio cholerae NOT DETECTED NOT DETECTED Final   Enteroaggregative E coli (EAEC) NOT DETECTED NOT DETECTED Final   Enteropathogenic E coli (EPEC) NOT DETECTED NOT DETECTED Final   Enterotoxigenic E coli (ETEC) NOT DETECTED NOT DETECTED Final   Shiga like toxin producing E coli (STEC) NOT DETECTED NOT DETECTED Final   Shigella/Enteroinvasive E coli (EIEC) NOT DETECTED NOT DETECTED Final   Cryptosporidium NOT DETECTED NOT DETECTED Final   Cyclospora cayetanensis NOT DETECTED NOT DETECTED Final   Entamoeba histolytica NOT DETECTED NOT DETECTED Final   Giardia lamblia NOT DETECTED NOT DETECTED Final   Adenovirus F40/41 NOT DETECTED NOT DETECTED Final   Astrovirus NOT DETECTED NOT DETECTED Final   Norovirus GI/GII NOT DETECTED NOT DETECTED Final   Rotavirus A NOT DETECTED NOT DETECTED Final   Sapovirus (I, II, IV, and V) NOT DETECTED NOT DETECTED Final    Comment: Performed at [facility], [address] [place], [place] [address]     Labs: BNP (last 3 results) Recent Labs    03/13/23 0125  BNP 60.5   Basic Metabolic Panel: Recent Labs  Lab 03/13/23 0127 03/14/23 0438  NA 135 137  K 3.8 3.8  CL 104 107  CO2 25 23   GLUCOSE 110* 94  BUN 16 14  CREATININE 0.73 0.79  CALCIUM 9.2 8.4*   Liver Function Tests: Recent Labs  Lab 03/13/23 0127  AST 19  ALT 16  ALKPHOS 45  BILITOT 0.8  PROT 6.0*  ALBUMIN 3.5   Recent Labs  Lab 03/13/23 0127  LIPASE 23   No results for input(s): "AMMONIA" in the last 168 hours. CBC: Recent Labs  Lab 03/13/23 0127 03/14/23 0438  WBC 8.1 6.6  HGB 11.5* 9.7*  HCT 34.9* 30.0*  MCV 101.2* 101.7*  PLT 323 280   Cardiac Enzymes: No results for input(s): "CKTOTAL", "CKMB", "CKMBINDEX", "TROPONINI" in the last 168 hours. BNP: Invalid input(s): "POCBNP" CBG: No results for input(s): "GLUCAP" in the last 168 hours. D-Dimer No results for input(s): "DDIMER" in the last 72 hours. Hgb A1c No results for input(s): "HGBA1C" in the last 72 hours. Lipid Profile No results for input(s): "CHOL", "HDL", "LDLCALC", "TRIG", "CHOLHDL", "LDLDIRECT" in the last 72 hours. Thyroid function studies No results for input(s): "TSH", "T4TOTAL", "T3FREE", "THYROIDAB" in the last 72 hours.  Invalid input(s): "FREET3" Anemia work up No results for input(s): "VITAMINB12", "FOLATE", "FERRITIN", "TIBC", "IRON", "RETICCTPCT" in the last 72 hours. Urinalysis    Component Value Date/Time   COLORURINE STRAW (A) 03/13/2023 0127   APPEARANCEUR CLEAR (A) 03/13/2023 0127   APPEARANCEUR CANCELED 02/09/2023 1121   LABSPEC 1.014 03/13/2023 0127   PHURINE 5.0 03/13/2023 0127   GLUCOSEU NEGATIVE 03/13/2023 0127   HGBUR SMALL (A) 03/13/2023 0127   BILIRUBINUR NEGATIVE 03/13/2023 0127   BILIRUBINUR negative 02/25/2023 1013   BILIRUBINUR Negative 02/09/2023 1121   KETONESUR NEGATIVE 03/13/2023 0127   PROTEINUR NEGATIVE 03/13/2023 0127   UROBILINOGEN 0.2 02/25/2023 1013   NITRITE NEGATIVE 03/13/2023 0127   LEUKOCYTESUR TRACE (A) 03/13/2023 0127   Sepsis Labs Recent Labs  Lab 03/13/23 0127 03/14/23 0438  WBC 8.1 6.6   Microbiology Recent Results (from the past 240 hour(s))  Urine  Culture     Status: Abnormal   Collection Time: 03/13/23  1:27 AM   Specimen: Urine, Clean Catch  Result Value Ref Range Status   Specimen Description   Final    URINE, CLEAN CATCH Performed at [facility], [address] [place], [place] [address]    Special Requests   Final    NONE Performed at [facility], [address] [place], [place] [address]    Culture MULTIPLE SPECIES PRESENT, SUGGEST RECOLLECTION (A)  Final   Report Status 03/14/2023 FINAL  Final  C Difficile Quick Screen w PCR reflex     Status: None   Collection Time: 03/13/23  9:00 AM   Specimen: STOOL  Result Value Ref Range Status   C Diff antigen NEGATIVE NEGATIVE Final   C Diff toxin NEGATIVE NEGATIVE Final   C Diff interpretation No C. difficile detected.  Final    Comment: Performed at [facility], [address] [place], [place] [address]  Gastrointestinal Panel by PCR , Stool     Status: None   Collection Time: 03/13/23  9:00 AM   Specimen: STOOL  Result Value Ref Range Status   Campylobacter species NOT DETECTED NOT DETECTED Final   Plesimonas shigelloides NOT DETECTED NOT DETECTED Final   Salmonella species NOT DETECTED NOT DETECTED Final   Yersinia enterocolitica NOT DETECTED NOT DETECTED Final   Vibrio species NOT DETECTED NOT DETECTED Final   Vibrio cholerae NOT DETECTED NOT DETECTED Final   Enteroaggregative E coli (EAEC) NOT DETECTED NOT DETECTED Final   Enteropathogenic E coli (EPEC) NOT DETECTED NOT DETECTED Final   Enterotoxigenic E coli (ETEC) NOT DETECTED NOT DETECTED Final   Shiga like toxin producing E coli (STEC) NOT DETECTED NOT DETECTED Final  Shigella/Enteroinvasive E coli (EIEC) NOT DETECTED NOT DETECTED Final   Cryptosporidium NOT DETECTED NOT DETECTED Final   Cyclospora cayetanensis NOT DETECTED NOT DETECTED Final   Entamoeba histolytica NOT DETECTED NOT DETECTED Final   Giardia lamblia NOT DETECTED NOT DETECTED Final   Adenovirus F40/41 NOT  DETECTED NOT DETECTED Final   Astrovirus NOT DETECTED NOT DETECTED Final   Norovirus GI/GII NOT DETECTED NOT DETECTED Final   Rotavirus A NOT DETECTED NOT DETECTED Final   Sapovirus (I, II, IV, and V) NOT DETECTED NOT DETECTED Final    Comment: [facility], [address] [place], [place] [address]     Time coordinating discharge: Over 30 minutes  SIGNED:   Charise Killian, MD  [facility] 03/14/2023, 12:04 PM Pager   If 7PM-7AM, please contact night-coverage [email or web address]

## 2023-03-15 ENCOUNTER — Telehealth: Payer: Self-pay

## 2023-03-15 ENCOUNTER — Ambulatory Visit: Payer: Self-pay

## 2023-03-15 ENCOUNTER — Other Ambulatory Visit: Payer: Self-pay

## 2023-03-15 ENCOUNTER — Telehealth: Payer: Self-pay | Admitting: *Deleted

## 2023-03-15 DIAGNOSIS — Z86010 Personal history of colonic polyps: Secondary | ICD-10-CM

## 2023-03-15 DIAGNOSIS — R933 Abnormal findings on diagnostic imaging of other parts of digestive tract: Secondary | ICD-10-CM

## 2023-03-15 MED ORDER — NA SULFATE-K SULFATE-MG SULF 17.5-3.13-1.6 GM/177ML PO SOLN: 354.0000 mL | Freq: Once | ORAL | 0 refills | Status: AC

## 2023-03-15 NOTE — Telephone Encounter (Signed)
Patient with diagnosis of Afib on Eliquis for anticoagulation.    Procedure: Colonoscopy Date of procedure: 04/01/2023   CHA2DS2-VASc Score = 5   This indicates a 7.2% annual risk of stroke. The patient's score is based upon: CHF History: 1 HTN History: 1 Diabetes History: 0 Stroke History: 0 Vascular Disease History: 0 Age Score: 2 Gender Score: 1   CrCl 67 mL/min  Platelet count 280K    Per office protocol, patient can hold Eliquis for 1 days prior to procedure.     **This guidance is not considered finalized until pre-operative APP has relayed final recommendations.**

## 2023-03-15 NOTE — Telephone Encounter (Signed)
Dr. Kirke Corin, patient seen by you on 03/12/23. Can you please document cardiac risk assessment for upcoming colonoscopy. Please send your reply to p cv div preop.  Thank you, Marcelino Duster

## 2023-03-15 NOTE — Telephone Encounter (Signed)
   [facility] Pre-operative Risk Assessment    Request for surgical clearance:  What type of surgery is being performed? Colonoscopy    When is this surgery scheduled? 04/01/2023   Are there any medications that need to be held prior to surgery and how long? Eliquis   Practice name and name of physician performing surgery? [facility]    What is your office phone and fax number? [phone]   Anesthesia type (None, local, MAC, general) ? General    Tammy Manning 03/15/2023, 11:37 AM  _________________________________________________________________   (provider comments below)

## 2023-03-15 NOTE — Chronic Care Management (AMB) (Signed)
   03/15/2023  Tammy Manning 20-Nov-1935 [id number]   Reason for Encounter: Patient is not currently enrolled in the CCM program. CCM status changed to previously enrolled.   Katha Cabal RN Care Manager/Chronic Care Management [phone]

## 2023-03-15 NOTE — Telephone Encounter (Signed)
Called and got patient schedule for 04/01/2023. Went over instructions, mailed them and sent to [employer]. Sent prep to the pharmacy. Sent blood thinner clearance to cardiology/

## 2023-03-15 NOTE — Telephone Encounter (Signed)
-----   Message from Toney Reil, MD sent at 03/14/2023  1:34 PM EDT ----- Regarding: Outpatient colonoscopy Tammy Manning  Please schedule outpatient colonoscopy next 2 to 3 weeks after obtaining cardiac clearance for history of A-fib and Eliquis Dx: History of adenoma colon and abnormal CT colon  Rohini Vanga

## 2023-03-15 NOTE — Transitions of Care (Post Inpatient/ED Visit) (Signed)
   03/15/2023  Name: Tammy Manning MRN: [id number] DOB: [birth year]  Today's TOC FU Call Status: Today's TOC FU Call Status:: Successful TOC FU Call Competed TOC FU Call Complete Date: 03/15/23  Transition Care Management Follow-up Telephone Call Date of Discharge: 03/14/23 Discharge Facility: [facility]) Type of Discharge: Inpatient Admission Primary Inpatient Discharge Diagnosis:: acute colitis How have you been since you were released from the hospital?: Better Any questions or concerns?: Yes Patient Questions/Concerns:: What can I eat on a soft diet. RN went over food and vegetables that can be eaten as soft diet.. Patient Questions/Concerns Addressed: Other:  Items Reviewed: Did you receive and understand the discharge instructions provided?: Yes Medications obtained and verified?: Yes (Medications Reviewed) Any new allergies since your discharge?: No Dietary orders reviewed?: Yes Type of Diet Ordered:: soft diet advance as tolerated Do you have support at home?: Yes Name of Support/Comfort Primary Source: everette  Home Care and Equipment/Supplies: Were Home Health Services Ordered?: No Any new equipment or medical supplies ordered?: No  Functional Questionnaire: Do you need assistance with bathing/showering or dressing?: No Do you need assistance with meal preparation?: Yes Do you need assistance with eating?: No Do you have difficulty maintaining continence: No Do you need assistance with getting out of bed/getting out of a chair/moving?: No Do you have difficulty managing or taking your medications?: No  Follow up appointments reviewed: PCP Follow-up appointment confirmed?: Yes Date of PCP follow-up appointment?: 03/19/23 Follow-up Provider: [facility] Follow-up appointment confirmed?: Yes Date of Specialist follow-up appointment?: 04/01/23 Follow-Up Specialty Provider:: Colonoscopy Do you need transportation to  your follow-up appointment?: No Do you understand care options if your condition(s) worsen?: Yes-patient verbalized understanding  SDOH Interventions Today    Flowsheet Row Most Recent Value  SDOH Interventions   Food Insecurity Interventions Intervention Not Indicated  Housing Interventions Intervention Not Indicated  Transportation Interventions Intervention Not Indicated       TOC Interventions Today    Flowsheet Row Most Recent Value  TOC Interventions   TOC Interventions Discussed/Reviewed TOC Interventions Discussed, TOC Interventions Reviewed      Interventions Today    Flowsheet Row Most Recent Value  General Interventions   General Interventions Discussed/Reviewed General Interventions Discussed, General Interventions Reviewed, Doctor Visits  Doctor Visits Discussed/Reviewed Doctor Visits Reviewed, Doctor Visits Discussed  Nutrition Interventions   Nutrition Discussed/Reviewed Nutrition Discussed, Nutrition Reviewed  Edmonia Lynch consists in a soft diet]      Gean Maidens BSN RN [facility] [phone]

## 2023-03-17 ENCOUNTER — Telehealth: Payer: Self-pay

## 2023-03-17 NOTE — Telephone Encounter (Signed)
She is at low risk for colonoscopy.

## 2023-03-17 NOTE — Telephone Encounter (Signed)
Per Cardiology patient needs to stop the Eliquis 1 day prior to procedure and restart it 1 day after procedure.

## 2023-03-17 NOTE — Telephone Encounter (Signed)
Patient verbalized understanding of instructions  

## 2023-03-17 NOTE — Telephone Encounter (Signed)
   Patient Name: Tammy Manning  DOB: 11/29/1934 MRN: [id number]  Primary Cardiologist: Lorine Bears, MD  Chart reviewed as part of pre-operative protocol coverage. Given past medical history and time since last visit, based on ACC/AHA guidelines, [name] is at acceptable risk for the planned procedure without further cardiovascular testing. Per Dr. Kirke Corin, "she is low risk for colonoscopy."   Per office protocol, she may hold Eliquis for 1 day prior to procedure. Please resume Eliquis as soon as possible postprocedure, at the discretion of the surgeon.    I will route this recommendation to the requesting party via Epic fax function and remove from pre-op pool.  Please call with questions.  Joylene Grapes, NP 03/17/2023, 12:32 PM

## 2023-03-19 ENCOUNTER — Ambulatory Visit (INDEPENDENT_AMBULATORY_CARE_PROVIDER_SITE_OTHER): Payer: Medicare Other | Admitting: Family Medicine

## 2023-03-19 ENCOUNTER — Encounter: Payer: Self-pay | Admitting: Family Medicine

## 2023-03-19 VITALS — BP 136/74 | HR 78 | Temp 97.7°F | Ht 64.0 in | Wt 181.5 lb

## 2023-03-19 DIAGNOSIS — K529 Noninfective gastroenteritis and colitis, unspecified: Secondary | ICD-10-CM

## 2023-03-19 DIAGNOSIS — I10 Essential (primary) hypertension: Secondary | ICD-10-CM | POA: Diagnosis not present

## 2023-03-19 DIAGNOSIS — K5790 Diverticulosis of intestine, part unspecified, without perforation or abscess without bleeding: Secondary | ICD-10-CM | POA: Diagnosis not present

## 2023-03-19 DIAGNOSIS — R1030 Lower abdominal pain, unspecified: Secondary | ICD-10-CM | POA: Diagnosis not present

## 2023-03-19 DIAGNOSIS — N390 Urinary tract infection, site not specified: Secondary | ICD-10-CM | POA: Diagnosis not present

## 2023-03-19 LAB — COMPREHENSIVE METABOLIC PANEL WITH GFR
ALT: 20 U/L (ref 0–35)
AST: 20 U/L (ref 0–37)
Albumin: 3.5 g/dL (ref 3.5–5.2)
Alkaline Phosphatase: 43 U/L (ref 39–117)
BUN: 10 mg/dL (ref 6–23)
CO2: 24 meq/L (ref 19–32)
Calcium: 8.9 mg/dL (ref 8.4–10.5)
Chloride: 105 meq/L (ref 96–112)
Creatinine, Ser: 0.67 mg/dL (ref 0.40–1.20)
GFR: 79.38 mL/min (ref >60.00)
Glucose, Bld: 87 mg/dL (ref 70–99)
Potassium: 3.8 meq/L (ref 3.5–5.1)
Sodium: 139 meq/L (ref 135–145)
Total Bilirubin: 0.4 mg/dL (ref 0.2–1.2)
Total Protein: 5.5 g/dL — ABNORMAL LOW (ref 6.0–8.3)

## 2023-03-19 LAB — CBC WITH DIFFERENTIAL/PLATELET
Basophils Absolute: 0.1 K/uL (ref 0.0–0.1)
Basophils Relative: 0.8 % (ref 0.0–3.0)
Eosinophils Absolute: 0.1 K/uL (ref 0.0–0.7)
Eosinophils Relative: 1.7 % (ref 0.0–5.0)
HCT: 34.6 % — ABNORMAL LOW (ref 36.0–46.0)
Hemoglobin: 11.7 g/dL — ABNORMAL LOW (ref 12.0–15.0)
Lymphocytes Relative: 18.9 % (ref 12.0–46.0)
Lymphs Abs: 1.6 K/uL (ref 0.7–4.0)
MCHC: 33.6 g/dL (ref 30.0–36.0)
MCV: 100.7 fl — ABNORMAL HIGH (ref 78.0–100.0)
Monocytes Absolute: 1.1 K/uL — ABNORMAL HIGH (ref 0.1–1.0)
Monocytes Relative: 13.1 % — ABNORMAL HIGH (ref 3.0–12.0)
Neutro Abs: 5.4 K/uL (ref 1.4–7.7)
Neutrophils Relative %: 65.5 % (ref 43.0–77.0)
Platelets: 385 K/uL (ref 150.0–400.0)
RBC: 3.44 Mil/uL — ABNORMAL LOW (ref 3.87–5.11)
RDW: 17.5 % — ABNORMAL HIGH (ref 11.5–15.5)
WBC: 8.3 K/uL (ref 4.0–10.5)

## 2023-03-19 NOTE — Progress Notes (Signed)
Subjective:    Patient ID: Tammy Manning, female    DOB: 03/11/1935, 88 y.o.   MRN: [id number]  HPI Pt presents for f/u of hospitalization for acute colitis   Wt Readings from Last 3 Encounters:  03/19/23 181 lb 8 oz (82.3 kg)  03/13/23 181 lb (82.1 kg)  03/12/23 180 lb 2 oz (81.7 kg)   31.15 kg/m  Vitals:   03/19/23 1128  BP: 136/74  Pulse: 78  Temp: 97.7 F (36.5 C)  SpO2: 97%   Hosp from 4/19 to 4/21 for acute colitis  Presented with abd pain for 2 weeks   (left lower) then diarrhea  Noted recent utis and abx   Presented the abd pain  Diarrhea and nausea    ED course  Data reviewed independently and ED Course: pt was found to have WBC 8.1, GFR> 60, urinalysis (clear appearance, trace amount of leukocyte, negative bacteria, WBC 0-5), temperature 97.5, blood pressure 135/50, heart rate 81, RR 18, oxygen saturation 94% on room air.  CT abdomen/pelvis showed sigmoid colon colitis. Patient is admitted to MedSurg bed as inpatient.  Dr. Allegra Lai of GI is consulted.  Acute colitis: CT showed possible sigmoid colitis. Continue on IV flagyl, rocephin. GI PCR panel, c. diff are both neg. Will get colonoscopy outpatient as per GI.  D/c home on po flagyl to complete the course. GI following and recs apprec    PAF: continue on cardizem, eliquis    HTN: continue on home dose of cardizem, irbesartan. IV hydralazine prn   bp is stable today  No cp or palpitations or headaches or edema  No side effects to medicines  BP Readings from Last 3 Encounters:  03/19/23 136/74  03/14/23 (!) 112/46  03/12/23 (!) 122/58       HLD: continue on statin    Hx of TIA: continue on statin    Seropositive rheumatoid arthritis: continue on home dose of methotrexate    Chronic diastolic CHF: echo on 04/29/2021 showed EF 60 to 65% with grade 1 diastolic dysfunction. CHF is compensated. Monitor I/Os   Obesity: BMI 31.0. Would benefit from weight loss  IMPRESSION: 1. Severe sigmoid  diverticulosis. Long segment mural thickening involving the sigmoid colon may relate to underlying muscular hypertrophy though a infectious or inflammatory colitis in this region could appear similarly. No evidence of obstruction or perforation. 2.  Aortic Atherosclerosis (ICD10-I70.0).    Her colonoscopy is planned on 5/9 Got permission to hold eliquis 1 d before and after from cardiology   Lab Results  Component Value Date   WBC 6.6 03/14/2023   HGB 9.7 (L) 03/14/2023   HCT 30.0 (L) 03/14/2023   MCV 101.7 (H) 03/14/2023   PLT 280 03/14/2023   Last metabolic panel Lab Results  Component Value Date   GLUCOSE 94 03/14/2023   NA 137 03/14/2023   K 3.8 03/14/2023   CL 107 03/14/2023   CO2 23 03/14/2023   BUN 14 03/14/2023   CREATININE 0.79 03/14/2023   GFRNONAA >60 03/14/2023   CALCIUM 8.4 (L) 03/14/2023   PHOS 4.0 09/24/2010   PROT 6.0 (L) 03/13/2023   ALBUMIN 3.5 03/13/2023   LABGLOB 2.2 02/02/2020   AGRATIO 2.0 02/02/2020   BILITOT 0.8 03/13/2023   ALKPHOS 45 03/13/2023   Manning 19 03/13/2023   ALT 16 03/13/2023   ANIONGAP 7 03/14/2023  Urine culture last was 4/20 -multiple species   Lab Results  Component Value Date   VITAMINB12 333 01/27/2023    No  abdominal pain now  Is eating very light/ bland - rice  Had some plain shrimp , grits  Egg whites  Is staying hydrated-drinking a lot   No fever Some nausea at times but no vomiting  Stool is not very formed but not watery either  Getting better   Some fatigue    Patient Active Problem List   Diagnosis Date Noted   Diverticulosis 03/21/2023   Acute colitis 03/13/2023   Chronic diastolic CHF (congestive heart failure) (HCC) 03/13/2023   Obesity (BMI 30-39.9) 03/13/2023   PAF (paroxysmal atrial fibrillation) (HCC) 03/13/2023   Recurrent UTI (urinary tract infection) 02/18/2023   Intertrigo 02/16/2023   Hemorrhoids 02/02/2023   Urinary incontinence 02/02/2023   Dilated aortic root (HCC) 01/26/2023    Osteopenia 01/26/2023   Multinodular goiter 02/27/2022   Encounter for screening mammogram for breast cancer 01/23/2022   High risk medication use 10/07/2021   Atrial fibrillation with RVR (HCC) 04/28/2021   Hypertrophic toenail 04/22/2021   Seropositive rheumatoid arthritis (HCC) 11/25/2020   Bilateral hand pain 11/25/2020   Bilateral shoulder pain 10/16/2020   Joint pain 10/16/2020   Estrogen deficiency 04/30/2020   Dry mouth 03/15/2020   Osteoarthritis of right hip 01/02/2020   Right thyroid nodule 05/12/2018   History of TIA (transient ischemic attack) 05/09/2018   Aortic insufficiency 09/10/2015   Stress reaction 09/08/2015   Encounter for Medicare annual wellness exam 02/28/2013   Bucket-handle tear of lateral meniscus of left knee as current injury 12/30/2012   Episodic atrial fibrillation (HCC) 11/17/2012   Hearing loss of both ears 01/14/2012   B12 deficiency 03/17/2010   GERD (gastroesophageal reflux disease) 03/17/2010   POSTMENOPAUSAL STATUS 08/20/2008   Essential hypertension 07/19/2007   Hyperlipidemia 06/24/2007   IBS 06/24/2007   DERMATITIS, ATOPIC 06/24/2007   SKIN CANCER, HX OF 06/24/2007   Past Medical History:  Diagnosis Date   A-fib (HCC)    Anginal pain (HCC)    Aortic insufficiency    a. 02/2020 Echo: EF 60-65%, no rwma, Gr1 DD, nl RV fxn, mildly dil LA, triv AI, Asc Ao 39mm; b. 04/2021 Echo: EF 60-65%, no rwma, GrI DD, nl RV fxn, mildly dil LA, AI not visualized. Mild-mod AoV sclerosis w/o stenosis.   Arthritis    Knees   B12 deficiency    Mild   Bucket-handle tear of lateral meniscus of left knee as current injury 12/30/2012   Cancer (HCC)    Skin, basal cell on nose and eyelid   DJD (degenerative joint disease)    Low back   Dyspepsia    GERD (gastroesophageal reflux disease)    Heart murmur    Heart palpitations    History of cardiac cath    a. 02/2017 Cath: Nl cors. Nl LV fxn. Abd Aogram w/o RAS.   HOH (hard of hearing)    Hyperlipidemia     Hypertension    IBS (irritable bowel syndrome)    Lactose intolerance    Mixed incontinence    PONV (postoperative nausea and vomiting)    Vulvar irritation    from urine pad, uses Triamcinolone oint.   Past Surgical History:  Procedure Laterality Date   ABDOMINAL AORTOGRAM N/A 03/15/2017   Procedure: Abdominal Aortogram;  Surgeon: Iran Ouch, MD;  Location: [facility];  Service: Cardiovascular;  Laterality: N/A;   ABDOMINAL HYSTERECTOMY  1982   Large fibroids and bleeding   APPENDECTOMY     BILATERAL SALPINGOOPHORECTOMY  1986   BONE GRAFT HIP ILIAC  CREST     To Left arm   BREAST REDUCTION SURGERY     BREAST SURGERY     CARDIAC CATHETERIZATION     CATARACT EXTRACTION W/PHACO Left 07/07/2016   Procedure: CATARACT EXTRACTION PHACO AND INTRAOCULAR LENS PLACEMENT (IOC);  Surgeon: Galen Manila, MD;  Location: [facility];  Service: Ophthalmology;  Laterality: Left;  [place] 01:10 AP% 18.3 CDE 12.91 Fluid pak lot # [id number]   CATARACT EXTRACTION W/PHACO Right 07/28/2016   Procedure: CATARACT EXTRACTION PHACO AND INTRAOCULAR LENS PLACEMENT (IOC);  Surgeon: Galen Manila, MD;  Location: [facility];  Service: Ophthalmology;  Laterality: Right;  [place] 01:26 AP% 18.5 CDE 16.12 Fluid pack lot # [id number]   COLONOSCOPY  07/2016   Dr. Lottie Mussel   DILATION AND CURETTAGE OF UTERUS     miscarragesx2   ESOPHAGOGASTRODUODENOSCOPY     Polyps   HEMORRHOID SURGERY  2006   KNEE ARTHROSCOPY WITH LATERAL MENISECTOMY Left 12/30/2012   Procedure: KNEE ARTHROSCOPY WITH LATERAL MENISECTOMY;  Surgeon: Eulas Post, MD;  Location: [facility];  Service: Orthopedics;  Laterality: Left;  LEFT KNEE SCOPE LATERAL MENISCECTOMY   LEFT HEART CATH AND CORONARY ANGIOGRAPHY N/A 03/15/2017   Procedure: Left Heart Cath and Coronary Angiography;  Surgeon: Iran Ouch, MD;  Location: [facility];  Service: Cardiovascular;  Laterality: N/A;   MOUTH SURGERY     skin cancer eyelid  2012    TONSILLECTOMY     Social History   Tobacco Use   Smoking status: Former    Packs/day: 1.00    Years: 12.00    Additional pack years: 0.00    Total pack years: 12.00    Types: Cigarettes    Quit date: 11/24/1963    Years since quitting: 59.3   Smokeless tobacco: Never  Vaping Use   Vaping Use: Never used  Substance Use Topics   Alcohol use: No    Alcohol/week: 0.0 standard drinks of alcohol   Drug use: No   Family History  Problem Relation Age of Onset   Cancer Mother        Colon   Heart disease Father        CAD   Diabetes Sister    Hypothyroidism Sister    Diabetes Brother    Leukemia Brother    Esophageal cancer Brother    Cancer Paternal Aunt        Breast   Diabetes Sister    Alzheimer's disease Sister    Diabetes Brother    Breast cancer Daughter    Allergies  Allergen Reactions   Antihistamines, Chlorpheniramine-Type Other (See Comments)    Reaction:  Makes pt hyper    Atenolol Hypertension   Cefuroxime Axetil Other (See Comments)    Upset stomach   Ciprofloxacin Other (See Comments)    Upset stomach   Co Q 10 [Coenzyme Q10] Other (See Comments)    Reaction:  GI upset    Crestor [Rosuvastatin Calcium] Other (See Comments)    Reaction:  Myalgias    Dextromethorphan-Guaifenesin Other (See Comments)    Reaction:  Made pt jittery    Epinephrine Hives   Ramipril Other (See Comments)    Upset stomach   Sulfamethoxazole-Trimethoprim Other (See Comments)    Upset stomach   Vitamin D Analogs Other (See Comments)    Reaction:  GI upset    Current Outpatient Medications on File Prior to Visit  Medication Sig Dispense Refill   acetaminophen (TYLENOL) 325 MG suppository Place 325  mg rectally every 8 (eight) hours as needed.     atorvastatin (LIPITOR) 10 MG tablet TAKE 1 TABLET(10 MG) BY MOUTH DAILY (Patient taking differently: Take 10 mg by mouth daily.) 90 tablet 2   Calcium Carbonate-Vitamin D (CALCIUM 600+D PO) Take 1 tablet by mouth daily.       clobetasol ointment (TEMOVATE) 0.05 % Apply 1 application topically 2 (two) times daily as needed.     clorazepate (TRANXENE) 7.5 MG tablet Take 1 tablet (7.5 mg total) by mouth daily as needed. 30 tablet 0   cyanocobalamin (,VITAMIN B-12,) 1000 MCG/ML injection Inject 1,000 mcg into the muscle every 30 (thirty) days.      ELIQUIS 5 MG TABS tablet TAKE 1 TABLET BY MOUTH TWICE DAILY 180 tablet 1   estradiol (ESTRACE) 0.1 MG/GM vaginal cream Apply a pea-sized amount to fingertip and wipe in vaginal introitus twice weekly 42.5 g 1   famotidine (PEPCID) 20 MG tablet Take 20 mg by mouth 2 (two) times daily as needed for heartburn or indigestion.     guaiFENesin (MUCINEX) 600 MG 12 hr tablet Take 600 mg by mouth 2 (two) times daily as needed.     hydrocortisone (ANUSOL-HC) 2.5 % rectal cream Apply 1 Application topically at bedtime. To affected area for 10 days 30 g 0   irbesartan (AVAPRO) 300 MG tablet TAKE 1 TABLET BY MOUTH DAILY (Patient taking differently: Take 300 mg by mouth daily.) 90 tablet 2   ketoconazole (NIZORAL) 2 % cream Apply 1 Application topically daily. To affected area 30 g 0   loperamide (IMODIUM) 2 MG capsule Take 2 mg by mouth as needed for diarrhea or loose stools.     MAGNESIUM LACTATE PO Take by mouth 2 (two) times daily.     methotrexate 50 MG/2ML injection Inject 0.6 mLs (15 mg total) into the skin once a week. 4 mL 2   triamcinolone (NASACORT) 55 MCG/ACT AERO nasal inhaler Place 2 sprays into the nose daily. 1 each 12   Tuberculin-Allergy Syringes 27G X 1/2" 1 ML MISC Use 1 syringe once weekly to inject methotrexate. 12 each 3   diltiazem (CARDIZEM CD) 180 MG 24 hr capsule Take 1 capsule (180 mg total) by mouth daily. 90 capsule 3   No current facility-administered medications on file prior to visit.    Review of Systems  Constitutional:  Positive for fatigue. Negative for activity change, appetite change, fever and unexpected weight change.  HENT:  Negative for congestion,  ear pain, rhinorrhea, sinus pressure and sore throat.   Eyes:  Negative for pain, redness and visual disturbance.  Respiratory:  Negative for cough, shortness of breath and wheezing.   Cardiovascular:  Negative for chest pain and palpitations.  Gastrointestinal:  Negative for abdominal pain, blood in stool, constipation and diarrhea.       Abd pain is better  Does feel mildly bloated   Endocrine: Negative for polydipsia and polyuria.  Genitourinary:  Negative for dysuria, frequency and urgency.  Musculoskeletal:  Negative for arthralgias, back pain and myalgias.  Skin:  Negative for pallor and rash.  Allergic/Immunologic: Negative for environmental allergies.  Neurological:  Negative for dizziness, syncope and headaches.  Hematological:  Negative for adenopathy. Does not bruise/bleed easily.  Psychiatric/Behavioral:  Negative for decreased concentration and dysphoric mood. The patient is not nervous/anxious.        Objective:   Physical Exam Constitutional:      General: She is not in acute distress.    Appearance: Normal  appearance. She is well-developed. She is obese. She is not ill-appearing or diaphoretic.  HENT:     Head: Normocephalic and atraumatic.     Mouth/Throat:     Mouth: Mucous membranes are moist.  Eyes:     Conjunctiva/sclera: Conjunctivae normal.     Pupils: Pupils are equal, round, and reactive to light.  Neck:     Thyroid: No thyromegaly.     Vascular: No carotid bruit or JVD.  Cardiovascular:     Rate and Rhythm: Normal rate and regular rhythm.     Heart sounds: Normal heart sounds.     No gallop.  Pulmonary:     Effort: Pulmonary effort is normal. No respiratory distress.     Breath sounds: Normal breath sounds. No wheezing or rales.  Abdominal:     General: There is no distension or abdominal bruit.     Palpations: Abdomen is soft. There is no mass.     Tenderness: There is no abdominal tenderness. There is no right CVA tenderness, left CVA tenderness  or guarding.     Hernia: No hernia is present.  Musculoskeletal:     Cervical back: Normal range of motion and neck supple.     Right lower leg: No edema.     Left lower leg: No edema.  Lymphadenopathy:     Cervical: No cervical adenopathy.  Skin:    General: Skin is warm and dry.     Coloration: Skin is not pale.     Findings: No rash.  Neurological:     Mental Status: She is alert.     Coordination: Coordination normal.     Deep Tendon Reflexes: Reflexes are normal and symmetric. Reflexes normal.  Psychiatric:        Mood and Affect: Mood normal.           Assessment & Plan:   Problem List Items Addressed This Visit       Cardiovascular and Mediastinum   Essential hypertension    In setting of a fib   bp in fair control at this time  BP Readings from Last 1 Encounters:  03/19/23 136/74  No changes needed Most recent labs reviewed  Disc lifstyle change with low sodium diet and exercise  Plan to continue Diltiazem 240 mg daily  avapro 300 mg daily  Rate is controlled         Digestive   Acute colitis - Primary    Recent hosptialization  Reviewed hospital records, lab results and studies in detail  Treated with IV flagyl  and rocpehin Sent home with flagyl and finished this  Much clinical improvement - no abd pain or nausea or fever Is advancing diet very slowly   Lab today- last Hb 9.7 / may have been dilutional  Colonoscopy is planned       Relevant Orders   CBC with Differential/Platelet (Completed)   Comprehensive metabolic panel (Completed)   Diverticulosis    This was noted on CT when in hosp for colitis  No abscess or infection noted Has colonoscopy scheduled         Genitourinary   Recurrent UTI (urinary tract infection)    Urine studies with recent hosp were ok  Abd pain found to be from colitis  Is staying hydrated         Other   RESOLVED: Lower abdominal pain    Resolved with tx of colitis

## 2023-03-19 NOTE — Patient Instructions (Signed)
You can gradually advance diet - avoid spicy and heavy foods  For now avoid nuts or seeds  Avoid super high fiber foods   If symptoms return or worsen- let us know (go to the ER if severe)   Labs today   Get your colonoscopy as planned

## 2023-03-21 DIAGNOSIS — K5790 Diverticulosis of intestine, part unspecified, without perforation or abscess without bleeding: Secondary | ICD-10-CM | POA: Insufficient documentation

## 2023-03-21 NOTE — Assessment & Plan Note (Signed)
Resolved with tx of colitis

## 2023-03-21 NOTE — Assessment & Plan Note (Signed)
This was noted on CT when in hosp for colitis  No abscess or infection noted Has colonoscopy scheduled

## 2023-03-21 NOTE — Assessment & Plan Note (Signed)
Urine studies with recent hosp were ok  Abd pain found to be from colitis  Is staying hydrated

## 2023-03-21 NOTE — Assessment & Plan Note (Signed)
In setting of a fib   bp in fair control at this time  BP Readings from Last 1 Encounters:  03/19/23 136/74   No changes needed Most recent labs reviewed  Disc lifstyle change with low sodium diet and exercise  Plan to continue Diltiazem 240 mg daily  avapro 300 mg daily  Rate is controlled

## 2023-03-21 NOTE — Assessment & Plan Note (Signed)
Recent hosptialization  Reviewed hospital records, lab results and studies in detail  Treated with IV flagyl  and rocpehin Sent home with flagyl and finished this  Much clinical improvement - no abd pain or nausea or fever Is advancing diet very slowly   Lab today- last Hb 9.7 / may have been dilutional  Colonoscopy is planned

## 2023-03-24 DIAGNOSIS — M170 Bilateral primary osteoarthritis of knee: Secondary | ICD-10-CM | POA: Diagnosis not present

## 2023-03-29 NOTE — Progress Notes (Unsigned)
Office Visit Note  Patient: Tammy Manning             Date of Birth: [birth year]           MRN: [id number]             PCP: [name], MD Referring: [name], Audrie Gallus, MD Visit Date: 03/30/2023   Subjective:  No chief complaint on file.   History of Present Illness: Tammy Manning is a 88 y.o. female here for follow up ***   Previous HPI 12/30/22 Tammy Manning is a 88 y.o. female here for follow up for seropositive RA on methotrexate 15 mg subcu weekly and folic acid 1 mg daily.  Since her last visit she still had some ongoing pain most persistently in the neck and shoulders bilaterally but without any severe flareup compared to before starting treatment.  She has been experiencing more pain affecting her left knee where there is known severe osteoarthritis.  She started taking a lot of Tylenol about 3 times every day 3 to 4 weeks ago but started to develop right-sided flank pain and diarrhea with yellow discolored stools.  She decreased to taking Tylenol no more than once daily with improvement of the symptoms.  She saw Dr. Dion Saucier with intra-articular steroid injection 2 weeks ago with a good improvement in symptoms.  She has not had any significant interval infections.     Previous HPI 03/09/22 Tammy Manning is a 88 y.o. female here for follow up for seropositive RA on MTX 15 mg [place] weekly. She is doing very well since last visit joint pains almost all better except bilateral hips bothering her intermittently. Left knee also creaky but not much inflammation problem. She has persistent stomach irritation, not much relief with taking pepcid as needed. [facility] not able to fill methotrexate Rx from supply issue but she still had medication on hand. She saw urology recently with persistent abnormal UA and cultures but mostly asymptomatic.   Previous HPI 12/29/21 Tammy Manning is a 88 y.o. female here for follow up for RA after restarting methotrexate at  15 mg  weekly, which had been delayed due to repeated interruptions from UTI infections and antibiotic treatment. She has noticed improvement in joint symptoms after about 2 weeks taking the methotrexate. She continues to have some nausea when she takes this despite subcutaneous route.   Previous HPI 11/25/20 Tammy Manning is a 88 y.o. female with a history of afib, TIA, and OA here for evaluation of joint pain and positive rheumatoid factor. She has joint pain in multiple sites including both hands but also has a lot of pain with the right shoulder that started more acutely with suspected rotator cuff tear. 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 She does notice swelling in hands intermittently. She has morning stiffness lasting less than 30 minutes daily also persistent pain throughout the day. She has had left radius fracture with surgical repair but no other major surgery of her arms.   No Rheumatology ROS completed.   PMFS History:  Patient Active Problem List   Diagnosis Date Noted   Diverticulosis 03/21/2023   Acute colitis 03/13/2023   Chronic diastolic CHF (congestive heart failure) (HCC) 03/13/2023   Obesity (BMI 30-39.9) 03/13/2023   PAF (paroxysmal atrial fibrillation) (HCC) 03/13/2023   Recurrent UTI (urinary tract infection) 02/18/2023   Intertrigo 02/16/2023   Hemorrhoids 02/02/2023   Urinary incontinence  02/02/2023   Dilated aortic root (HCC) 01/26/2023   Osteopenia 01/26/2023   Multinodular goiter 02/27/2022   Encounter for screening mammogram for breast cancer 01/23/2022   High risk medication use 10/07/2021   Atrial fibrillation with RVR (HCC) 04/28/2021   Hypertrophic toenail 04/22/2021   Seropositive rheumatoid arthritis (HCC) 11/25/2020   Bilateral hand pain 11/25/2020   Bilateral shoulder pain 10/16/2020   Joint pain 10/16/2020   Estrogen deficiency 04/30/2020   Dry mouth 03/15/2020    Osteoarthritis of right hip 01/02/2020   Right thyroid nodule 05/12/2018   History of TIA (transient ischemic attack) 05/09/2018   Aortic insufficiency 09/10/2015   Stress reaction 09/08/2015   Encounter for Medicare annual wellness exam 02/28/2013   Bucket-handle tear of lateral meniscus of left knee as current injury 12/30/2012   Episodic atrial fibrillation (HCC) 11/17/2012   Hearing loss of both ears 01/14/2012   B12 deficiency 03/17/2010   GERD (gastroesophageal reflux disease) 03/17/2010   POSTMENOPAUSAL STATUS 08/20/2008   Essential hypertension 07/19/2007   Hyperlipidemia 06/24/2007   IBS 06/24/2007   DERMATITIS, ATOPIC 06/24/2007   SKIN CANCER, HX OF 06/24/2007    Past Medical History:  Diagnosis Date   A-fib (HCC)    Anginal pain (HCC)    Aortic insufficiency    a. 02/2020 Echo: EF 60-65%, no rwma, Gr1 DD, nl RV fxn, mildly dil LA, triv AI, Asc Ao 39mm; b. 04/2021 Echo: EF 60-65%, no rwma, GrI DD, nl RV fxn, mildly dil LA, AI not visualized. Mild-mod AoV sclerosis w/o stenosis.   Arthritis    Knees   B12 deficiency    Mild   Bucket-handle tear of lateral meniscus of left knee as current injury 12/30/2012   Cancer (HCC)    Skin, basal cell on nose and eyelid   DJD (degenerative joint disease)    Low back   Dyspepsia    GERD (gastroesophageal reflux disease)    Heart murmur    Heart palpitations    History of cardiac cath    a. 02/2017 Cath: Nl cors. Nl LV fxn. Abd Aogram w/o RAS.   HOH (hard of hearing)    Hyperlipidemia    Hypertension    IBS (irritable bowel syndrome)    Lactose intolerance    Mixed incontinence    PONV (postoperative nausea and vomiting)    Vulvar irritation    from urine pad, uses Triamcinolone oint.    Family History  Problem Relation Age of Onset   Cancer Mother        Colon   Heart disease Father        CAD   Diabetes Sister    Hypothyroidism Sister    Diabetes Brother    Leukemia Brother    Esophageal cancer Brother    Cancer  Paternal Aunt        Breast   Diabetes Sister    Alzheimer's disease Sister    Diabetes Brother    Breast cancer Daughter    Past Surgical History:  Procedure Laterality Date   ABDOMINAL AORTOGRAM N/A 03/15/2017   Procedure: Abdominal Aortogram;  Surgeon: Iran Ouch, MD;  Location: [facility];  Service: Cardiovascular;  Laterality: N/A;   ABDOMINAL HYSTERECTOMY  1982   Large fibroids and bleeding   APPENDECTOMY     BILATERAL SALPINGOOPHORECTOMY  1986   BONE GRAFT HIP ILIAC CREST     To Left arm   BREAST REDUCTION SURGERY     BREAST SURGERY  CARDIAC CATHETERIZATION     CATARACT EXTRACTION W/PHACO Left 07/07/2016   Procedure: CATARACT EXTRACTION PHACO AND INTRAOCULAR LENS PLACEMENT (IOC);  Surgeon: Galen Manila, MD;  Location: [facility];  Service: Ophthalmology;  Laterality: Left;  [place] 01:10 AP% 18.3 CDE 12.91 Fluid pak lot # [id number]   CATARACT EXTRACTION W/PHACO Right 07/28/2016   Procedure: CATARACT EXTRACTION PHACO AND INTRAOCULAR LENS PLACEMENT (IOC);  Surgeon: Galen Manila, MD;  Location: [facility];  Service: Ophthalmology;  Laterality: Right;  [place] 01:26 AP% 18.5 CDE 16.12 Fluid pack lot # [id number]   COLONOSCOPY  07/2016   Dr. Lottie Mussel   DILATION AND CURETTAGE OF UTERUS     miscarragesx2   ESOPHAGOGASTRODUODENOSCOPY     Polyps   HEMORRHOID SURGERY  2006   KNEE ARTHROSCOPY WITH LATERAL MENISECTOMY Left 12/30/2012   Procedure: KNEE ARTHROSCOPY WITH LATERAL MENISECTOMY;  Surgeon: Eulas Post, MD;  Location:  [facility];  Service: Orthopedics;  Laterality: Left;  LEFT KNEE SCOPE LATERAL MENISCECTOMY   LEFT HEART CATH AND CORONARY ANGIOGRAPHY N/A 03/15/2017   Procedure: Left Heart Cath and Coronary Angiography;  Surgeon: Iran Ouch, MD;  Location: [facility];  Service: Cardiovascular;  Laterality: N/A;   MOUTH SURGERY     skin cancer eyelid  2012   TONSILLECTOMY     Social History   Social History Narrative   Wellsite geologist.      Immunization History  Administered Date(s) Administered   Fluad Quad(high Dose 65+) 10/16/2020, 07/24/2022   Influenza Split 07/24/2011, 08/23/2014   Influenza Whole 08/23/2004, 07/24/2009, 08/06/2010, 08/05/2012   Influenza, High Dose Seasonal PF 08/11/2016, 08/27/2017, 08/02/2018, 09/05/2021   Influenza,inj,Quad PF,6+ Mos 08/16/2013, 08/07/2015   Influenza-Unspecified 09/05/2021   PFIZER(Purple Top)SARS-COV-2 Vaccination 12/15/2019, 01/05/2020, 10/25/2020   Pneumococcal Conjugate-13 08/24/2014   Pneumococcal Polysaccharide-23 08/20/2008   Respiratory Syncytial Virus Vaccine,Recomb Aduvanted(Arexvy) 12/30/2022   Td 03/20/2003, 02/28/2013   Zoster, Live 09/04/2015     Objective: Vital Signs: There were no vitals taken for this visit.   Physical Exam   Musculoskeletal Exam: ***  CDAI Exam: CDAI Score: -- Patient Global: --; Provider Global: -- Swollen: --; Tender: -- Joint Exam 03/30/2023   No joint exam has been documented for this visit   There is currently no information documented on the homunculus. Go to the Rheumatology activity and complete the homunculus joint exam.  Investigation: No additional findings.  Imaging: CT ABDOMEN PELVIS W CONTRAST  Result Date: 03/13/2023 CLINICAL DATA:  Lower abdominal pain, recurrent diarrhea, recurrent urinary tract infection EXAM: CT ABDOMEN AND PELVIS WITH CONTRAST TECHNIQUE: Multidetector CT imaging of the abdomen and pelvis was performed using the standard protocol following bolus administration of intravenous contrast. RADIATION DOSE REDUCTION: This exam was performed according to the departmental dose-optimization program which includes automated exposure control, adjustment of the mA and/or kV according to patient size and/or use of iterative reconstruction technique. CONTRAST:  OMNIPAQUE IOHEXOL 300 MG/ML  SOLN COMPARISON:  None Available. FINDINGS: Lower chest: Calcification of the aortic valve leaflets noted. Cardiac  size within normal limits. No pericardial effusion. Visualized lung bases are clear. Hepatobiliary: No focal liver abnormality is seen. No gallstones, gallbladder wall thickening, or biliary dilatation. Pancreas: Unremarkable Spleen: Unremarkable Adrenals/Urinary Tract: The adrenal glands are unremarkable. Mild bilateral renal cortical atrophy. The kidneys are otherwise unremarkable. The bladder is unremarkable. Stomach/Bowel: Surgical staple line related to colo-anal anastomosis noted. Severe sigmoid diverticulosis. Long segment mural thickening involving the sigmoid colon may relate to underlying muscular hypertrophy though a  infectious or inflammatory colitis in this region could appear similarly. The stomach, small bowel, and large bowel are otherwise unremarkable. No evidence of obstruction. No free intraperitoneal gas or fluid. Appendix absent. Vascular/Lymphatic: Extensive aortoiliac atherosclerotic calcification. No aortic aneurysm. No pathologic adenopathy within the abdomen and pelvis. Reproductive: Status post hysterectomy. No adnexal masses. Other: No abdominal wall hernia. Musculoskeletal: Degenerative changes seen at the lumbosacral junction with grade 1 anterolisthesis L4-5. No acute bone abnormality. No lytic or blastic bone lesion. IMPRESSION: 1. Severe sigmoid diverticulosis. Long segment mural thickening involving the sigmoid colon may relate to underlying muscular hypertrophy though a infectious or inflammatory colitis in this region could appear similarly. No evidence of obstruction or perforation. 2.  Aortic Atherosclerosis (ICD10-I70.0). Electronically Signed   By: Helyn Numbers M.D.   On: 03/13/2023 02:39    Recent Labs: Lab Results  Component Value Date   WBC 8.3 03/19/2023   HGB 11.7 (L) 03/19/2023   PLT 385.0 03/19/2023   NA 139 03/19/2023   K 3.8 03/19/2023   CL 105 03/19/2023   CO2 24 03/19/2023   GLUCOSE 87 03/19/2023   BUN 10 03/19/2023   CREATININE 0.67 03/19/2023    BILITOT 0.4 03/19/2023   ALKPHOS 43 03/19/2023   AST 20 03/19/2023   ALT 20 03/19/2023   PROT 5.5 (L) 03/19/2023   ALBUMIN 3.5 03/19/2023   CALCIUM 8.9 03/19/2023   GFRAA 71 02/02/2020    Speciality Comments: No specialty comments available.  Procedures:  No procedures performed Allergies: Antihistamines, chlorpheniramine-type; Atenolol; Cefuroxime axetil; Ciprofloxacin; Co q 10 [coenzyme q10]; Crestor [rosuvastatin calcium]; Dextromethorphan-guaifenesin; Epinephrine; Ramipril; Sulfamethoxazole-trimethoprim; and Vitamin d analogs   Assessment / Plan:     Visit Diagnoses: No diagnosis found.  ***  Orders: No orders of the defined types were placed in this encounter.  No orders of the defined types were placed in this encounter.    Follow-Up Instructions: No follow-ups on file.   Fuller Plan, MD  Note - This record has been created using [employer].  Chart creation errors have been sought, but may not always  have been located. Such creation errors do not reflect on  the standard of medical care.

## 2023-03-30 ENCOUNTER — Encounter: Payer: Self-pay | Admitting: Internal Medicine

## 2023-03-30 ENCOUNTER — Ambulatory Visit: Payer: Medicare Other | Attending: Internal Medicine | Admitting: Internal Medicine

## 2023-03-30 VITALS — BP 126/77 | HR 64 | Resp 16 | Ht 64.5 in | Wt 176.0 lb

## 2023-03-30 DIAGNOSIS — K529 Noninfective gastroenteritis and colitis, unspecified: Secondary | ICD-10-CM | POA: Diagnosis not present

## 2023-03-30 DIAGNOSIS — Z79899 Other long term (current) drug therapy: Secondary | ICD-10-CM | POA: Insufficient documentation

## 2023-03-30 DIAGNOSIS — M059 Rheumatoid arthritis with rheumatoid factor, unspecified: Secondary | ICD-10-CM | POA: Insufficient documentation

## 2023-03-30 MED ORDER — METHOTREXATE SODIUM CHEMO INJECTION 50 MG/2ML: 15.0000 mg | INTRAMUSCULAR | 2 refills | Status: DC

## 2023-03-30 MED ORDER — TUBERCULIN-ALLERGY SYRINGES 27G X 1/2" 1 ML MISC
1 refills | Status: DC
Start: 2023-03-30 — End: 2023-06-02

## 2023-04-01 ENCOUNTER — Other Ambulatory Visit: Payer: Self-pay

## 2023-04-01 ENCOUNTER — Ambulatory Visit: Payer: Medicare Other | Admitting: Certified Registered Nurse Anesthetist

## 2023-04-01 ENCOUNTER — Encounter: Payer: Self-pay | Admitting: Gastroenterology

## 2023-04-01 ENCOUNTER — Encounter
Admission: RE | Disposition: A | Discharge status: Home / Self Care | Payer: Self-pay | Source: Home / Self Care | Attending: Gastroenterology

## 2023-04-01 ENCOUNTER — Ambulatory Visit
Admission: RE | Admit: 2023-04-01 | Discharge: 2023-04-01 | Disposition: A | Discharge status: Home / Self Care | Payer: Medicare Other | Attending: Gastroenterology | Admitting: Gastroenterology

## 2023-04-01 DIAGNOSIS — D120 Benign neoplasm of cecum: Secondary | ICD-10-CM | POA: Insufficient documentation

## 2023-04-01 DIAGNOSIS — E785 Hyperlipidemia, unspecified: Secondary | ICD-10-CM | POA: Insufficient documentation

## 2023-04-01 DIAGNOSIS — R933 Abnormal findings on diagnostic imaging of other parts of digestive tract: Secondary | ICD-10-CM

## 2023-04-01 DIAGNOSIS — K219 Gastro-esophageal reflux disease without esophagitis: Secondary | ICD-10-CM | POA: Diagnosis not present

## 2023-04-01 DIAGNOSIS — M1990 Unspecified osteoarthritis, unspecified site: Secondary | ICD-10-CM | POA: Insufficient documentation

## 2023-04-01 DIAGNOSIS — K589 Irritable bowel syndrome without diarrhea: Secondary | ICD-10-CM | POA: Diagnosis not present

## 2023-04-01 DIAGNOSIS — K648 Other hemorrhoids: Secondary | ICD-10-CM | POA: Insufficient documentation

## 2023-04-01 DIAGNOSIS — D124 Benign neoplasm of descending colon: Secondary | ICD-10-CM | POA: Insufficient documentation

## 2023-04-01 DIAGNOSIS — D122 Benign neoplasm of ascending colon: Secondary | ICD-10-CM | POA: Diagnosis not present

## 2023-04-01 DIAGNOSIS — Z87891 Personal history of nicotine dependence: Secondary | ICD-10-CM | POA: Insufficient documentation

## 2023-04-01 DIAGNOSIS — Z09 Encounter for follow-up examination after completed treatment for conditions other than malignant neoplasm: Secondary | ICD-10-CM | POA: Diagnosis not present

## 2023-04-01 DIAGNOSIS — I4891 Unspecified atrial fibrillation: Secondary | ICD-10-CM | POA: Diagnosis not present

## 2023-04-01 DIAGNOSIS — K649 Unspecified hemorrhoids: Secondary | ICD-10-CM | POA: Diagnosis not present

## 2023-04-01 DIAGNOSIS — I110 Hypertensive heart disease with heart failure: Secondary | ICD-10-CM | POA: Insufficient documentation

## 2023-04-01 DIAGNOSIS — K5730 Diverticulosis of large intestine without perforation or abscess without bleeding: Secondary | ICD-10-CM | POA: Diagnosis not present

## 2023-04-01 DIAGNOSIS — D123 Benign neoplasm of transverse colon: Secondary | ICD-10-CM | POA: Diagnosis not present

## 2023-04-01 DIAGNOSIS — D126 Benign neoplasm of colon, unspecified: Secondary | ICD-10-CM | POA: Diagnosis not present

## 2023-04-01 DIAGNOSIS — Z86010 Personal history of colonic polyps: Secondary | ICD-10-CM | POA: Insufficient documentation

## 2023-04-01 DIAGNOSIS — Z1211 Encounter for screening for malignant neoplasm of colon: Secondary | ICD-10-CM | POA: Diagnosis not present

## 2023-04-01 DIAGNOSIS — I480 Paroxysmal atrial fibrillation: Secondary | ICD-10-CM | POA: Diagnosis not present

## 2023-04-01 DIAGNOSIS — I5032 Chronic diastolic (congestive) heart failure: Secondary | ICD-10-CM | POA: Insufficient documentation

## 2023-04-01 DIAGNOSIS — K644 Residual hemorrhoidal skin tags: Secondary | ICD-10-CM | POA: Insufficient documentation

## 2023-04-01 DIAGNOSIS — K635 Polyp of colon: Secondary | ICD-10-CM | POA: Diagnosis not present

## 2023-04-01 HISTORY — PX: COLONOSCOPY WITH PROPOFOL: SHX5780

## 2023-04-01 SURGERY — COLONOSCOPY WITH PROPOFOL
Anesthesia: General

## 2023-04-01 MED ORDER — PROPOFOL 500 MG/50ML IV EMUL
INTRAVENOUS | Status: DC | PRN
  Administered 2023-04-01: 150 ug/kg/min via INTRAVENOUS

## 2023-04-01 MED ORDER — LIDOCAINE HCL (CARDIAC) PF 100 MG/5ML IV SOSY
PREFILLED_SYRINGE | INTRAVENOUS | Status: DC | PRN
  Administered 2023-04-01: 50 mg via INTRAVENOUS

## 2023-04-01 MED ORDER — SODIUM CHLORIDE 0.9 % IV SOLN: INTRAVENOUS | Status: DC

## 2023-04-01 MED ORDER — PROPOFOL 10 MG/ML IV BOLUS
INTRAVENOUS | Status: DC | PRN
  Administered 2023-04-01: 80 mg via INTRAVENOUS

## 2023-04-01 MED ORDER — PROPOFOL 1000 MG/100ML IV EMUL
INTRAVENOUS | Status: AC
  Filled 2023-04-01: qty 100

## 2023-04-01 NOTE — Anesthesia Preprocedure Evaluation (Addendum)
Anesthesia Evaluation  Patient identified by MRN, date of birth, ID band Patient awake    Reviewed: Allergy & Precautions, H&P , NPO status , Patient's Chart, lab work & pertinent test results  Airway Mallampati: II  TM Distance: >3 FB Neck ROM: full    Dental no notable dental hx.    Pulmonary former smoker   Pulmonary exam normal        Cardiovascular hypertension, (-) angina +CHF (Chronic diastolic CHF)  + dysrhythmias (h/o afib) Atrial Fibrillation   04/2021 Echo: EF 60-65%, no rwma, GrI DD, nl RV fxn, mildly dil LA, AI not visualized. Mild-mod AoV sclerosis w/o stenosis.   Neuro/Psych TIA (pt is unaware of this diagnosis) negative psych ROS   GI/Hepatic Neg liver ROS,GERD  ,,  Endo/Other  negative endocrine ROS    Renal/GU negative Renal ROS  negative genitourinary   Musculoskeletal  (+) Arthritis , Rheumatoid disorders,    Abdominal   Peds  Hematology  (+) Blood dyscrasia, anemia   Anesthesia Other Findings Past Medical History: No date: A-fib (HCC) No date: Anginal pain (HCC) No date: Aortic insufficiency     Comment:  a. 02/2020 Echo: EF 60-65%, no rwma, Gr1 DD, nl RV fxn,               mildly dil LA, triv AI, Asc Ao 39mm; b. 04/2021 Echo: EF               60-65%, no rwma, GrI DD, nl RV fxn, mildly dil LA, AI not              visualized. Mild-mod AoV sclerosis w/o stenosis. No date: Arthritis     Comment:  Knees No date: B12 deficiency     Comment:  Mild 12/30/2012: Bucket-handle tear of lateral meniscus of left knee as  current injury No date: Cancer (HCC)     Comment:  Skin, basal cell on nose and eyelid No date: Colitis No date: DJD (degenerative joint disease)     Comment:  Low back No date: Dyspepsia No date: GERD (gastroesophageal reflux disease) No date: Heart murmur No date: Heart palpitations No date: History of cardiac cath     Comment:  a. 02/2017 Cath: Nl cors. Nl LV fxn. Abd Aogram w/o  RAS. No date: HOH (hard of hearing) No date: Hyperlipidemia No date: Hypertension No date: IBS (irritable bowel syndrome) No date: Lactose intolerance No date: Mixed incontinence No date: PONV (postoperative nausea and vomiting) No date: Vulvar irritation     Comment:  from urine pad, uses Triamcinolone oint.  Past Surgical History: 03/15/2017: ABDOMINAL AORTOGRAM; N/A     Comment:  Procedure: Abdominal Aortogram;  Surgeon: Iran Ouch, MD;  Location: [facility];  Service:               Cardiovascular;  Laterality: N/A; 1982: ABDOMINAL HYSTERECTOMY     Comment:  Large fibroids and bleeding No date: APPENDECTOMY 1986: BILATERAL SALPINGOOPHORECTOMY No date: BONE GRAFT HIP ILIAC CREST     Comment:  To Left arm No date: BREAST REDUCTION SURGERY No date: BREAST SURGERY No date: CARDIAC CATHETERIZATION 07/07/2016: CATARACT EXTRACTION W/PHACO; Left     Comment:  Procedure: CATARACT EXTRACTION PHACO AND INTRAOCULAR               LENS PLACEMENT (IOC);  Surgeon: Galen Manila, MD;  Location: [facility];  Service: Ophthalmology;  Laterality:              Left;  [place] 01:10 AP% 18.3 CDE 12.91 Fluid pak lot #               [id number] 07/28/2016: CATARACT EXTRACTION W/PHACO; Right     Comment:  Procedure: CATARACT EXTRACTION PHACO AND INTRAOCULAR               LENS PLACEMENT (IOC);  Surgeon: Galen Manila, MD;                Location: [facility];  Service: Ophthalmology;  Laterality:              Right;  [place] 01:26 AP% 18.5 CDE 16.12 Fluid pack lot #               [id number] 07/2016: COLONOSCOPY     Comment:  Dr. Lottie Mussel No date: DILATION AND CURETTAGE OF UTERUS     Comment:  miscarragesx2 No date: ESOPHAGOGASTRODUODENOSCOPY     Comment:  Polyps 2006: HEMORRHOID SURGERY 12/30/2012: KNEE ARTHROSCOPY WITH LATERAL MENISECTOMY; Left     Comment:  Procedure: KNEE ARTHROSCOPY WITH LATERAL MENISECTOMY;                Surgeon: Eulas Post, MD;  Location:  [facility];  Service: Orthopedics;  Laterality: Left;              LEFT KNEE SCOPE LATERAL MENISCECTOMY 03/15/2017: LEFT HEART CATH AND CORONARY ANGIOGRAPHY; N/A     Comment:  Procedure: Left Heart Cath and Coronary Angiography;                Surgeon: Iran Ouch, MD;  Location: [facility];  Service: Cardiovascular;  Laterality: N/A; No date: MOUTH SURGERY 2012: skin cancer eyelid No date: TONSILLECTOMY  BMI    Body Mass Index: 28.90 kg/m      Reproductive/Obstetrics negative OB ROS                             Anesthesia Physical Anesthesia Plan  ASA: 3  Anesthesia Plan: General   Post-op Pain Management:    Induction: Intravenous  PONV Risk Score and Plan: Propofol infusion and TIVA  Airway Management Planned: Natural Airway  Additional Equipment:   Intra-op Plan:   Post-operative Plan:   Informed Consent: I have reviewed the patients History and Physical, chart, labs and discussed the procedure including the risks, benefits and alternatives for the proposed anesthesia with the patient or authorized representative who has indicated his/her understanding and acceptance.     Dental Advisory Given  Plan Discussed with: CRNA and Surgeon  Anesthesia Plan Comments:         Anesthesia Quick Evaluation

## 2023-04-01 NOTE — H&P (Signed)
[name], MD [address]  [address]  [place], [place] [address]  Main: [phone]  Fax: [phone] Pager: [phone]  Primary Care Physician:  [name], Audrie Gallus, MD Primary Gastroenterologist:  Dr. [name]  Pre-Procedure History & Physical: HPI:  Tammy Manning is a 88 y.o. female is here for an colonoscopy.   Past Medical History:  Diagnosis Date   A-fib (HCC)    Anginal pain (HCC)    Aortic insufficiency    a. 02/2020 Echo: EF 60-65%, no rwma, Gr1 DD, nl RV fxn, mildly dil LA, triv AI, Asc Ao 39mm; b. 04/2021 Echo: EF 60-65%, no rwma, GrI DD, nl RV fxn, mildly dil LA, AI not visualized. Mild-mod AoV sclerosis w/o stenosis.   Arthritis    Knees   B12 deficiency    Mild   Bucket-handle tear of lateral meniscus of left knee as current injury 12/30/2012   Cancer (HCC)    Skin, basal cell on nose and eyelid   Colitis    DJD (degenerative joint disease)    Low back   Dyspepsia    GERD (gastroesophageal reflux disease)    Heart murmur    Heart palpitations    History of cardiac cath    a. 02/2017 Cath: Nl cors. Nl LV fxn. Abd Aogram w/o RAS.   HOH (hard of hearing)    Hyperlipidemia    Hypertension    IBS (irritable bowel syndrome)    Lactose intolerance    Mixed incontinence    PONV (postoperative nausea and vomiting)    Vulvar irritation    from urine pad, uses Triamcinolone oint.    Past Surgical History:  Procedure Laterality Date   ABDOMINAL AORTOGRAM N/A 03/15/2017   Procedure: Abdominal Aortogram;  Surgeon: Iran Ouch, MD;  Location: [facility];  Service: Cardiovascular;  Laterality: N/A;   ABDOMINAL HYSTERECTOMY  1982   Large fibroids and bleeding   APPENDECTOMY     BILATERAL SALPINGOOPHORECTOMY  1986   BONE GRAFT HIP ILIAC CREST     To Left arm   BREAST REDUCTION SURGERY     BREAST SURGERY     CARDIAC CATHETERIZATION     CATARACT EXTRACTION W/PHACO Left 07/07/2016   Procedure: CATARACT EXTRACTION PHACO AND  INTRAOCULAR LENS PLACEMENT (IOC);  Surgeon: Galen Manila, MD;  Location: [facility];  Service: Ophthalmology;  Laterality: Left;  [place] 01:10 AP% 18.3 CDE 12.91 Fluid pak lot # [id number]   CATARACT EXTRACTION W/PHACO Right 07/28/2016   Procedure: CATARACT EXTRACTION PHACO AND INTRAOCULAR LENS PLACEMENT (IOC);  Surgeon: Galen Manila, MD;  Location: [facility];  Service: Ophthalmology;  Laterality: Right;  [place] 01:26 AP% 18.5 CDE 16.12 Fluid pack lot # [id number]   COLONOSCOPY  07/2016   Dr. Lottie Mussel   DILATION AND CURETTAGE OF UTERUS     miscarragesx2   ESOPHAGOGASTRODUODENOSCOPY     Polyps   HEMORRHOID SURGERY  2006   KNEE ARTHROSCOPY WITH LATERAL MENISECTOMY Left 12/30/2012   Procedure: KNEE ARTHROSCOPY WITH LATERAL MENISECTOMY;  Surgeon: Eulas Post, MD;  Location: [facility];  Service: Orthopedics;  Laterality: Left;  LEFT KNEE SCOPE LATERAL MENISCECTOMY   LEFT HEART CATH AND CORONARY ANGIOGRAPHY N/A 03/15/2017   Procedure: Left Heart Cath and Coronary Angiography;  Surgeon: Iran Ouch, MD;  Location: [facility];  Service: Cardiovascular;  Laterality: N/A;   MOUTH SURGERY     skin cancer eyelid  2012   TONSILLECTOMY  Prior to Admission medications   Medication Sig Start Date End Date Taking? Authorizing Provider  diltiazem (CARDIZEM CD) 180 MG 24 hr capsule Take 1 capsule (180 mg total) by mouth daily. 06/23/22 04/01/23 Yes Dunn, Ryan M, PA-C  irbesartan (AVAPRO) 300 MG tablet TAKE 1 TABLET BY MOUTH DAILY Patient taking differently: Take 300 mg by mouth daily. 02/02/23  Yes [name], Audrie Gallus, MD  acetaminophen (TYLENOL) 325 MG suppository Place 325 mg rectally every 8 (eight) hours as needed.    [provider]  atorvastatin (LIPITOR) 10 MG tablet TAKE 1 TABLET(10 MG) BY MOUTH DAILY Patient taking differently: Take 10 mg by mouth daily. 02/02/23   [name], Audrie Gallus, MD  Calcium Carbonate-Vitamin D (CALCIUM 600+D PO) Take 1 tablet by mouth daily.      [provider]  clobetasol ointment (TEMOVATE) 0.05 % Apply 1 application topically 2 (two) times daily as needed.    [provider]  clorazepate (TRANXENE) 7.5 MG tablet Take 1 tablet (7.5 mg total) by mouth daily as needed. 12/15/22   [name], Audrie Gallus, MD  cyanocobalamin (,VITAMIN B-12,) 1000 MCG/ML injection Inject 1,000 mcg into the muscle every 30 (thirty) days.  12/16/16   [provider]  ELIQUIS 5 MG TABS tablet TAKE 1 TABLET BY MOUTH TWICE DAILY 02/18/23   Iran Ouch, MD  estradiol (ESTRACE) 0.1 MG/GM vaginal cream Apply a pea-sized amount to fingertip and wipe in vaginal introitus twice weekly 01/20/23   Michiel Cowboy A, PA-C  famotidine (PEPCID) 20 MG tablet Take 20 mg by mouth 2 (two) times daily as needed for heartburn or indigestion.    [provider]  guaiFENesin (MUCINEX) 600 MG 12 hr tablet Take 600 mg by mouth 2 (two) times daily as needed.    [provider]  hydrocortisone (ANUSOL-HC) 2.5 % rectal cream Apply 1 Application topically at bedtime. To affected area for 10 days 02/02/23   [name], Audrie Gallus, MD  ketoconazole (NIZORAL) 2 % cream Apply 1 Application topically daily. To affected area 02/16/23   [name], Audrie Gallus, MD  loperamide (IMODIUM) 2 MG capsule Take 2 mg by mouth as needed for diarrhea or loose stools.    [provider]  MAGNESIUM LACTATE PO Take by mouth 2 (two) times daily.    [provider]  methotrexate 50 MG/2ML injection Inject 0.6 mLs (15 mg total) into the skin once a week. 03/30/23   Rice, Jamesetta Orleans, MD  Na Sulfate-K Sulfate-Mg Sulf 17.5-3.13-1.6 GM/177ML SOLN See admin instructions. Patient not taking: Reported on 03/30/2023 03/15/23   [provider]  triamcinolone (NASACORT) 55 MCG/ACT AERO nasal inhaler Place 2 sprays into the nose daily. 01/23/22   [name], Audrie Gallus, MD  Tuberculin-Allergy Syringes 27G X 1/2" 1 ML MISC Use 1 syringe once weekly to inject methotrexate. 03/30/23   Fuller Plan, MD    Allergies as of 03/15/2023 - Review Complete 03/13/2023  Allergen Reaction Noted   Antihistamines, chlorpheniramine-type Other (See Comments) 08/30/2015   Atenolol Hypertension 05/19/2017   Cefuroxime axetil Other (See Comments) 06/24/2007   Ciprofloxacin Other (See Comments) 06/24/2007   Co q 10 [coenzyme q10] Other (See Comments) 03/06/2015   Crestor [rosuvastatin calcium] Other (See Comments) 03/11/2011   Dextromethorphan-guaifenesin Other (See Comments) 11/21/2008   Epinephrine Hives 06/24/2007   Ramipril Other (See Comments) 06/24/2007   Sulfamethoxazole-trimethoprim Other (See Comments) 06/24/2007   Vitamin d analogs Other (See Comments) 03/06/2015    Family History  Problem Relation Age of Onset  Cancer Mother        Colon   Heart disease Father        CAD   Diabetes Sister    Hypothyroidism Sister    Diabetes Brother    Leukemia Brother    Esophageal cancer Brother    Cancer Paternal Aunt        Breast   Diabetes Sister    Alzheimer's disease Sister    Diabetes Brother    Breast cancer Daughter     Social History   Socioeconomic History   Marital status: Married    Spouse name: Not on file   Number of children: 2   Years of education: Not on file   Highest education level: Not on file  Occupational History   Occupation: IT trainer: RETIRED  Tobacco Use   Smoking status: Former    Packs/day: 1.00    Years: 12.00    Additional pack years: 0.00    Total pack years: 12.00    Types: Cigarettes    Quit date: 11/24/1963    Years since quitting: 59.3   Smokeless tobacco: Never  Vaping Use   Vaping Use: Never used  Substance and Sexual Activity   Alcohol use: No    Alcohol/week: 0.0 standard drinks of alcohol   Drug use: No   Sexual activity: Not Currently  Other Topics Concern   Not on file  Social History Narrative   Wellsite geologist.     Social Determinants of Health   Financial Resource Strain: Low Risk   (12/25/2022)   Overall Financial Resource Strain (CARDIA)    Difficulty of Paying Living Expenses: Not hard at all  Food Insecurity: No Food Insecurity (03/15/2023)   Hunger Vital Sign    Worried About Running Out of Food in the Last Year: Never true    Ran Out of Food in the Last Year: Never true  Transportation Needs: No Transportation Needs (03/15/2023)   PRAPARE - Administrator, Civil Service (Medical): No    Lack of Transportation (Non-Medical): No  Physical Activity: Insufficiently Active (12/25/2022)   Exercise Vital Sign    Days of Exercise per Week: 7 days    Minutes of Exercise per Session: 10 min  Stress: No Stress Concern Present (12/25/2022)   [employer] of Occupational Health - Occupational Stress Questionnaire    Feeling of Stress : Not at all  Social Connections: Moderately Isolated (12/25/2022)   Social Connection and Isolation Panel [NHANES]    Frequency of Communication with Friends and Family: More than three times a week    Frequency of Social Gatherings with Friends and Family: More than three times a week    Attends Religious Services: Never    Database administrator or Organizations: No    Attends Banker Meetings: Never    Marital Status: Married  Catering manager Violence: Not At Risk (03/13/2023)   Humiliation, Afraid, Rape, and Kick questionnaire    Fear of Current or Ex-Partner: No    Emotionally Abused: No    Physically Abused: No    Sexually Abused: No    Review of Systems: See HPI, otherwise negative ROS  Physical Exam: BP (!) 178/65   Pulse 71   Temp (!) 97.4 F (36.3 C) (Temporal)   Resp 16   Ht 5' 4.5" (1.638 m)   Wt 77.6 kg   SpO2 99%   BMI 28.90 kg/m  General:   Alert,  pleasant  and cooperative in NAD Head:  Normocephalic and atraumatic. Neck:  Supple; no masses or thyromegaly. Lungs:  Clear throughout to auscultation.    Heart:  Regular rate and rhythm. Abdomen:  Soft, nontender and nondistended. Normal  bowel sounds, without guarding, and without rebound.   Neurologic:  Alert and  oriented x4;  grossly normal neurologically.  Impression/Plan: Tammy Manning is here for an colonoscopy to be performed for h/o colon adenoma, colon wall thickening on CT  Risks, benefits, limitations, and alternatives regarding  colonoscopy have been reviewed with the patient.  Questions have been answered.  All parties agreeable.   Lannette Donath, MD  04/01/2023, 8:42 AM

## 2023-04-01 NOTE — Anesthesia Procedure Notes (Signed)
Date/Time: 04/01/2023 9:30 AM  Performed by: Ginger Carne, CRNAPre-anesthesia Checklist: Patient identified, Emergency Drugs available, Suction available, Patient being monitored and Timeout performed Patient Re-evaluated:Patient Re-evaluated prior to induction Oxygen Delivery Method: Nasal cannula Preoxygenation: Pre-oxygenation with 100% oxygen Induction Type: IV induction

## 2023-04-01 NOTE — Transfer of Care (Signed)
Immediate Anesthesia Transfer of Care Note  Patient: Aldean Ast  Procedure(s) Performed: COLONOSCOPY WITH PROPOFOL  Patient Location: Endoscopy Unit  Anesthesia Type:General  Level of Consciousness: awake, alert , and oriented  Airway & Oxygen Therapy: Patient Spontanous Breathing  Post-op Assessment: Report given to RN and Post -op Vital signs reviewed and stable  Post vital signs: Reviewed and stable  Last Vitals:  Vitals Value Taken Time  BP 137/61 04/01/23 0959  Temp    Pulse 70 04/01/23 0959  Resp 16 04/01/23 0959  SpO2 100 % 04/01/23 0959    Last Pain:  Vitals:   04/01/23 0959  TempSrc:   PainSc: 0-No pain         Complications: No notable events documented.

## 2023-04-01 NOTE — Anesthesia Postprocedure Evaluation (Signed)
Anesthesia Post Note  Patient: Tammy Manning  Procedure(s) Performed: COLONOSCOPY WITH PROPOFOL  Patient location during evaluation: PACU Anesthesia Type: General Level of consciousness: awake and alert Pain management: pain level controlled Vital Signs Assessment: post-procedure vital signs reviewed and stable Respiratory status: spontaneous breathing, nonlabored ventilation and respiratory function stable Cardiovascular status: blood pressure returned to baseline and stable Postop Assessment: no apparent nausea or vomiting Anesthetic complications: no   No notable events documented.   Last Vitals:  Vitals:   04/01/23 1008 04/01/23 1019  BP: (!) 150/77 (!) 163/64  Pulse: 70 72  Resp: 16 19  Temp:    SpO2: 99% 100%    Last Pain:  Vitals:   04/01/23 1019  TempSrc:   PainSc: 0-No pain                 Foye Deer

## 2023-04-01 NOTE — Op Note (Signed)
[facility] Gastroenterology Patient Name: Sirenity Gilsdorf Procedure Date: 04/01/2023 9:24 AM MRN: [id number] Account #: [id number] Date of Birth: [birth year] Admit Type: Outpatient Age: 88 Room: [facility] ENDO ROOM 3 Gender: Female Note Status: Finalized Instrument Name: Peds Colonoscope [id number] Procedure:             Colonoscopy Indications:           High risk colon cancer surveillance: Personal history                         of colonic polyps Providers:             Toney Reil MD, MD Referring MD:          Audrie Gallus. [name] (Referring MD) Medicines:             General Anesthesia Complications:         No immediate complications. Estimated blood loss: None. Procedure:             Pre-Anesthesia Assessment:                        - Prior to the procedure, a History and Physical was                         performed, and patient medications and allergies were                         reviewed. The patient is competent. The risks and                         benefits of the procedure and the sedation options and                         risks were discussed with the patient. All questions                         were answered and informed consent was obtained.                         Patient identification and proposed procedure were                         verified by the physician, the nurse, the                         anesthesiologist, the anesthetist and the technician                         in the pre-procedure area in the procedure room in the                         endoscopy suite. Mental Status Examination: alert and                         oriented. Airway Examination: normal oropharyngeal                         airway and neck mobility. Respiratory Examination:  clear to auscultation. CV Examination: normal.                         Prophylactic Antibiotics: The patient does not require                         prophylactic  antibiotics. Prior Anticoagulants: The                         patient has taken Eliquis (apixaban), last dose was 2                         days prior to procedure. ASA Grade Assessment: III - A                         patient with severe systemic disease. After reviewing                         the risks and benefits, the patient was deemed in                         satisfactory condition to undergo the procedure. The                         anesthesia plan was to use general anesthesia.                         Immediately prior to administration of medications,                         the patient was re-assessed for adequacy to receive                         sedatives. The heart rate, respiratory rate, oxygen                         saturations, blood pressure, adequacy of pulmonary                         ventilation, and response to care were monitored                         throughout the procedure. The physical status of the                         patient was re-assessed after the procedure.                        After obtaining informed consent, the colonoscope was                         passed under direct vision. Throughout the procedure,                         the patient's blood pressure, pulse, and oxygen                         saturations were monitored continuously. The  Colonoscope was introduced through the anus and                         advanced to the the cecum, identified by appendiceal                         orifice and ileocecal valve. The colonoscopy was                         performed without difficulty. The patient tolerated                         the procedure well. The quality of the bowel                         preparation was evaluated using the BBPS [facility] Bowel                         Preparation Scale) with scores of: Right Colon = 3,                         Transverse Colon = 3 and Left Colon = 3 (entire mucosa                          seen well with no residual staining, small fragments                         of stool or opaque liquid). The total BBPS score                         equals 9. The terminal ileum, ileocecal valve,                         appendiceal orifice, and rectum were photographed. Findings:      The perianal and digital rectal examinations were normal. Pertinent       negatives include normal sphincter tone and no palpable rectal lesions.      Three sessile polyps were found in the ascending colon and cecum. The       polyps were diminutive in size. These polyps were removed with a jumbo       cold forceps. Resection and retrieval were complete.      Three sessile polyps were found in the descending colon and transverse       colon. The polyps were 3 to 5 mm in size. These polyps were removed with       a cold snare. Resection and retrieval were complete. Estimated blood       loss was minimal.      Multiple large-mouthed diverticula were found in the recto-sigmoid colon       and sigmoid colon.      External and internal hemorrhoids were found during retroflexion. The       hemorrhoids were large. Impression:            - Three diminutive polyps in the ascending colon and                         in the cecum, removed with a jumbo cold forceps.  Resected and retrieved.                        - Three 3 to 5 mm polyps in the descending colon and                         in the transverse colon, removed with a cold snare.                         Resected and retrieved.                        - Diverticulosis in the recto-sigmoid colon and in the                         sigmoid colon.                        - External and internal hemorrhoids. Recommendation:        - Discharge patient to home (with escort).                        - Resume previous diet today.                        - Continue present medications.                        - Await pathology  results.                        - Repeat colonoscopy in 3 - 5 years for surveillance                         based on pathology results. Procedure Code(s):     --- Professional ---                        [phone], Colonoscopy, flexible; with removal of                         tumor(s), polyp(s), or other lesion(s) by snare                         technique                        45380, 59, Colonoscopy, flexible; with biopsy, single                         or multiple Diagnosis Code(s):     --- Professional ---                        Z86.010, Personal history of colonic polyps                        D12.2, Benign neoplasm of ascending colon                        D12.0, Benign neoplasm of cecum  D12.3, Benign neoplasm of transverse colon (hepatic                         flexure or splenic flexure)                        D12.4, Benign neoplasm of descending colon                        K64.8, Other hemorrhoids                        K57.30, Diverticulosis of large intestine without                         perforation or abscess without bleeding CPT copyright 2022 American Medical Association. All rights reserved. The codes documented in this report are preliminary and upon coder review may  be revised to meet current compliance requirements. Dr. Libby Maw Toney Reil MD, MD 04/01/2023 10:00:26 AM This report has been signed electronically. Number of Addenda: 0 Note Initiated On: 04/01/2023 9:24 AM Scope Withdrawal Time: 0 hours 14 minutes 17 seconds  Total Procedure Duration: 0 hours 19 minutes 21 seconds  Estimated Blood Loss:  Estimated blood loss: none.      [facility]

## 2023-04-02 LAB — SURGICAL PATHOLOGY

## 2023-04-05 ENCOUNTER — Encounter: Payer: Self-pay | Admitting: Gastroenterology

## 2023-04-12 ENCOUNTER — Ambulatory Visit (INDEPENDENT_AMBULATORY_CARE_PROVIDER_SITE_OTHER): Payer: Medicare Other | Admitting: Nurse Practitioner

## 2023-04-12 ENCOUNTER — Encounter: Payer: Self-pay | Admitting: Nurse Practitioner

## 2023-04-12 VITALS — BP 130/64 | HR 70 | Temp 98.2°F | Resp 16 | Ht 64.5 in | Wt 175.5 lb

## 2023-04-12 DIAGNOSIS — N3001 Acute cystitis with hematuria: Secondary | ICD-10-CM | POA: Diagnosis not present

## 2023-04-12 DIAGNOSIS — N898 Other specified noninflammatory disorders of vagina: Secondary | ICD-10-CM

## 2023-04-12 DIAGNOSIS — N390 Urinary tract infection, site not specified: Secondary | ICD-10-CM

## 2023-04-12 DIAGNOSIS — R8290 Unspecified abnormal findings in urine: Secondary | ICD-10-CM

## 2023-04-12 DIAGNOSIS — R300 Dysuria: Secondary | ICD-10-CM | POA: Diagnosis not present

## 2023-04-12 LAB — POC URINALSYSI DIPSTICK (AUTOMATED)
Bilirubin, UA: NEGATIVE
Glucose, UA: NEGATIVE
Ketones, UA: NEGATIVE
Nitrite, UA: NEGATIVE
Protein, UA: NEGATIVE
Spec Grav, UA: <=1.005 — AB (ref 1.010–1.025)
Urobilinogen, UA: 0.2 E.U./dL
pH, UA: 6 (ref 5.0–8.0)

## 2023-04-12 MED ORDER — CEPHALEXIN 500 MG PO CAPS
500.0000 mg | ORAL_CAPSULE | Freq: Two times a day (BID) | ORAL | 0 refills | Status: AC
Start: 2023-04-12 — End: 2023-04-19

## 2023-04-12 NOTE — Progress Notes (Signed)
Acute Office Visit  Subjective:     Patient ID: Tammy Manning, female    DOB: [birth year], 88 y.o.   MRN: [id number]  Chief Complaint  Patient presents with   Vaginal Itching   Vaginal Pain    Burning X 1 week    Vaginal Itching Associated symptoms include abdominal pain, dysuria, frequency and nausea. Pertinent negatives include no chills, constipation, diarrhea, fever, hematuria or vomiting.  Vaginal Pain Associated symptoms include abdominal pain, dysuria, frequency and nausea. Pertinent negatives include no chills, constipation, diarrhea, fever, hematuria or vomiting.   Patient is in today for urinary   States that she has a frequent utis States that she was in the hosptial recenlty and was dx with   Started last Sunday    Review of Systems  Constitutional:  Negative for chills and fever.  Gastrointestinal:  Positive for abdominal pain and nausea. Negative for constipation, diarrhea and vomiting.  Genitourinary:  Positive for dysuria, frequency and vaginal pain. Negative for hematuria.       "+" incomplete emptying         Objective:    BP 130/64   Pulse 70   Temp 98.2 F (36.8 C)   Resp 16   Ht 5' 4.5" (1.638 m)   Wt 175 lb 8 oz (79.6 kg)   SpO2 98%   BMI 29.66 kg/m    Physical Exam Vitals and nursing note reviewed.  Constitutional:      Appearance: Normal appearance.  Cardiovascular:     Rate and Rhythm: Normal rate and regular rhythm.     Heart sounds: Normal heart sounds.  Pulmonary:     Effort: Pulmonary effort is normal.     Breath sounds: Normal breath sounds.  Abdominal:     General: Bowel sounds are normal. There is no distension.     Palpations: There is no mass.     Tenderness: There is abdominal tenderness. There is no right CVA tenderness or left CVA tenderness.     Hernia: No hernia is present.    Neurological:     Mental Status: She is alert.     Results for orders placed or performed in visit on 04/12/23  POCT Urinalysis  Dipstick (Automated)  Result Value Ref Range   Color, UA yellow    Clarity, UA cloudy    Glucose, UA Negative Negative   Bilirubin, UA Negative    Ketones, UA Negative    Spec Grav, UA <=1.005 (A) 1.010 - 1.025   Blood, UA Trace    pH, UA 6.0 5.0 - 8.0   Protein, UA Negative Negative   Urobilinogen, UA 0.2 0.2 or 1.0 E.U./dL   Nitrite, UA Negative    Leukocytes, UA Large (3+) (A) Negative        Assessment & Plan:   Problem List Items Addressed This Visit       Genitourinary   Acute cystitis with hematuria    3+ leuks with trace of blood will treat with Keflex 500 mg twice daily for 7 days.  Most recent urinary culture was susceptible to Keflex.  Patient to follow-up if no improvement pending urinary culture      Relevant Medications   cephALEXin (KEFLEX) 500 MG capsule   Other Relevant Orders   Urine Culture   Recurrent UTI (urinary tract infection)    History of the same.  Patient has been urology.  Also on estradiol vaginal cream.  Recommend following up with urology as recommended  Relevant Medications   cephALEXin (KEFLEX) 500 MG capsule     Other   Dysuria - Primary    UA in office.  History of recurrent UTIs      Relevant Orders   POCT Urinalysis Dipstick (Automated) (Completed)   Other Visit Diagnoses     Vaginal itching       Abnormal urinalysis           Meds ordered this encounter  Medications   cephALEXin (KEFLEX) 500 MG capsule    Sig: Take 1 capsule (500 mg total) by mouth 2 (two) times daily for 7 days.    Dispense:  14 capsule    Refill:  0    Order Specific Question:   Supervising Provider    Answer:   [name], MARNE A [[id number]    Return if symptoms worsen or fail to improve.  Audria Nine, NP

## 2023-04-12 NOTE — Assessment & Plan Note (Signed)
UA in office.  History of recurrent UTIs

## 2023-04-12 NOTE — Patient Instructions (Signed)
Nice to see you today I have sent in the antibiotic to the pharmacy  If you do not improve let us know I will be in touch with the urine culture once I have reviewed it

## 2023-04-12 NOTE — Assessment & Plan Note (Signed)
History of the same.  Patient has been urology.  Also on estradiol vaginal cream.  Recommend following up with urology as recommended

## 2023-04-12 NOTE — Assessment & Plan Note (Signed)
3+ leuks with trace of blood will treat with Keflex 500 mg twice daily for 7 days.  Most recent urinary culture was susceptible to Keflex.  Patient to follow-up if no improvement pending urinary culture

## 2023-04-13 LAB — URINE CULTURE

## 2023-04-15 LAB — URINE CULTURE
MICRO NUMBER:: 14978538
SPECIMEN QUALITY:: ADEQUATE

## 2023-04-20 ENCOUNTER — Other Ambulatory Visit: Payer: Self-pay

## 2023-04-20 ENCOUNTER — Encounter: Payer: Self-pay | Admitting: Nurse Practitioner

## 2023-04-20 ENCOUNTER — Emergency Department
Admission: EM | Admit: 2023-04-20 | Discharge: 2023-04-20 | Disposition: A | Discharge status: Home / Self Care | Payer: Medicare Other | Attending: Emergency Medicine | Admitting: Emergency Medicine

## 2023-04-20 ENCOUNTER — Emergency Department: Payer: Medicare Other

## 2023-04-20 ENCOUNTER — Encounter: Payer: Self-pay | Admitting: Emergency Medicine

## 2023-04-20 DIAGNOSIS — W010XXA Fall on same level from slipping, tripping and stumbling without subsequent striking against object, initial encounter: Secondary | ICD-10-CM | POA: Insufficient documentation

## 2023-04-20 DIAGNOSIS — S0990XA Unspecified injury of head, initial encounter: Secondary | ICD-10-CM | POA: Diagnosis not present

## 2023-04-20 DIAGNOSIS — H53001 Unspecified amblyopia, right eye: Secondary | ICD-10-CM | POA: Diagnosis not present

## 2023-04-20 DIAGNOSIS — Z7901 Long term (current) use of anticoagulants: Secondary | ICD-10-CM | POA: Insufficient documentation

## 2023-04-20 DIAGNOSIS — I509 Heart failure, unspecified: Secondary | ICD-10-CM | POA: Insufficient documentation

## 2023-04-20 DIAGNOSIS — H539 Unspecified visual disturbance: Secondary | ICD-10-CM

## 2023-04-20 DIAGNOSIS — H538 Other visual disturbances: Secondary | ICD-10-CM | POA: Insufficient documentation

## 2023-04-20 DIAGNOSIS — S199XXA Unspecified injury of neck, initial encounter: Secondary | ICD-10-CM | POA: Diagnosis not present

## 2023-04-20 NOTE — Discharge Instructions (Signed)
Please call Dr. Gerome Sam office this afternoon to request an appointment for tomorrow.  Return to the ER for symptoms of concern

## 2023-04-20 NOTE — ED Notes (Signed)
[facility]  right  20/25         Left   20/25         Both  22/25

## 2023-04-20 NOTE — ED Provider Notes (Signed)
[facility] Provider Note    Event Date/Time   First MD Initiated Contact with Patient 04/20/23 1211     (approximate)   History   Fall   HPI  Tammy Manning is a 88 y.o. female with history of GERD, A-fib, osteopenia, CHF and as listed in EMR presents to the emergency department for treatment and evaluation after mechanical, nonsyncopal fall. She tripped and her head hit her husband's knee, then the floor. No loss of consciousness. She is on Eliquis. She has a slight headache and her right eye seems "foggy." No eye pain.      Physical Exam   Triage Vital Signs: ED Triage Vitals [04/20/23 1123]  Enc Vitals Group     BP (!) 151/67     Pulse Rate 90     Resp 18     Temp 98.6 F (37 C)     Temp Source Oral     SpO2 96 %     Weight      Height      Head Circumference      Peak Flow      Pain Score 2     Pain Loc      Pain Edu?      Excl. in GC?     Most recent vital signs: Vitals:   04/20/23 1123  BP: (!) 151/67  Pulse: 90  Resp: 18  Temp: 98.6 F (37 C)  SpO2: 96%    General: Awake, no distress.  CV:  Good peripheral perfusion.  Resp:  Normal effort.  Abd:  No distention.  Other:  Sclera are clear; No hyphema. No pain with ROM of eyes   ED Results / Procedures / Treatments   Labs (all labs ordered are listed, but only abnormal results are displayed) Labs Reviewed - No data to display   EKG  Not indicated.   RADIOLOGY  Image and radiology report reviewed and interpreted by me. Radiology report consistent with the same.  CT head and cervical spine negative for acute findings.  PROCEDURES:  Critical Care performed: No  Procedures  Visual accuity: OD 20/25; OS 20/25; OU 20/25  IOP: 14;12;14  MEDICATIONS ORDERED IN ED:  Medications - No data to display   IMPRESSION / MDM / ASSESSMENT AND PLAN / ED COURSE   I have reviewed the triage note.  Differential diagnosis includes, but is not limited to,  Intracranial bleed; skull fracture; TBI; concussion  Patient's presentation is most consistent with acute presentation with potential threat to life or bodily function.  88 year old female presenting to the emergency department for treatment and evaluation after sustaining a mechanical, nonsyncopal fall.  See HPI for further details.  Exam is reassuring.  CT of the head and cervical spine are both negative for acute findings.  Patient does continued to have a mild headache and a sensation of a "fog" over the right eye.  Visual acuity and intraocular pressures are normal.  Pupils are equal, round, reactive to light.   Case discussed with Dr. Inez Pilgrim with [facility]. Patient is to call the office this afternoon and request a follow up appointment with her ophthalmologist, Dr. Druscilla Brownie, also at [facility]   Head injury instructions discussed with patient and husband.  ER return precautions reviewed.  Patient will call [facility] this afternoon to schedule an appointment for tomorrow.     FINAL CLINICAL IMPRESSION(S) / ED DIAGNOSES   Final diagnoses:  Minor head injury, initial encounter  Visual changes     Rx / DC Orders   ED Discharge Orders     None        Note:  This document was prepared using Dragon voice recognition software and may include unintentional dictation errors.   Chinita Pester, FNP 04/20/23 1439    Pilar Jarvis, MD 04/20/23 [phone]

## 2023-04-20 NOTE — ED Triage Notes (Signed)
Patient to ED via POV for fall. Patient states she tripped over her feet hitting husbands knee then floor. No LOC but on blood thinners. Patient states her vision "isn't right" but not blurry out of right eye. C/o headache 2/10, no dizziness or lightheadedness.

## 2023-04-20 NOTE — ED Notes (Signed)
See triage note; pt's speech clear, skin dry, resp reg/unlabored, L fa wrapped in ace bandage as states when fell got abrasions to arm; denies LOC; states fall "stunned" her briefly; states "I just don't feel right"; reports continued R eye blurriness; denies nausea, lightheadedness, dizziness currently; mild HA; calmly sitting in recliner; husband with pt; no obvious injuries or bruising/hematomas noted; A&Ox4. Is on eliquis.

## 2023-04-21 ENCOUNTER — Ambulatory Visit (INDEPENDENT_AMBULATORY_CARE_PROVIDER_SITE_OTHER): Payer: Medicare Other | Admitting: Internal Medicine

## 2023-04-21 ENCOUNTER — Encounter: Payer: Self-pay | Admitting: Internal Medicine

## 2023-04-21 VITALS — BP 138/70 | HR 73 | Temp 97.6°F | Ht 64.5 in | Wt 177.0 lb

## 2023-04-21 DIAGNOSIS — S51812A Laceration without foreign body of left forearm, initial encounter: Secondary | ICD-10-CM | POA: Diagnosis not present

## 2023-04-21 DIAGNOSIS — Z23 Encounter for immunization: Secondary | ICD-10-CM

## 2023-04-21 DIAGNOSIS — N390 Urinary tract infection, site not specified: Secondary | ICD-10-CM

## 2023-04-21 DIAGNOSIS — H35363 Drusen (degenerative) of macula, bilateral: Secondary | ICD-10-CM | POA: Diagnosis not present

## 2023-04-21 DIAGNOSIS — T148XXA Other injury of unspecified body region, initial encounter: Secondary | ICD-10-CM

## 2023-04-21 MED ORDER — SILVER SULFADIAZINE 1 % EX CREA: 1.0000 | TOPICAL_CREAM | Freq: Every day | CUTANEOUS | 0 refills | Status: DC

## 2023-04-21 NOTE — Addendum Note (Signed)
Addended by: Eual Fines on: 04/21/2023 12:29 PM   Modules accepted: Orders

## 2023-04-21 NOTE — Progress Notes (Signed)
Subjective:    Patient ID: Tammy Manning, female    DOB: [birth year], 88 y.o.   MRN: [id number]  HPI Here for ER follow up With husband  Turned around--lost balance Larey Seat and hit head on husband's knee--than went to the floor Skinned the left arm and hit head on the floor also No LOC Got up on her own Husband drove her to ER  Husband bandaged arm Not really painful--unless she leans on it  Current Outpatient Medications on File Prior to Visit  Medication Sig Dispense Refill   acetaminophen (TYLENOL) 325 MG suppository Place 325 mg rectally every 8 (eight) hours as needed.     atorvastatin (LIPITOR) 10 MG tablet TAKE 1 TABLET(10 MG) BY MOUTH DAILY (Patient taking differently: Take 10 mg by mouth daily.) 90 tablet 2   Calcium Carbonate-Vitamin D (CALCIUM 600+D PO) Take 1 tablet by mouth daily.      clobetasol ointment (TEMOVATE) 0.05 % Apply 1 application topically 2 (two) times daily as needed.     clorazepate (TRANXENE) 7.5 MG tablet Take 1 tablet (7.5 mg total) by mouth daily as needed. 30 tablet 0   cyanocobalamin (,VITAMIN B-12,) 1000 MCG/ML injection Inject 1,000 mcg into the muscle every 30 (thirty) days.      ELIQUIS 5 MG TABS tablet TAKE 1 TABLET BY MOUTH TWICE DAILY 180 tablet 1   estradiol (ESTRACE) 0.1 MG/GM vaginal cream Apply a pea-sized amount to fingertip and wipe in vaginal introitus twice weekly 42.5 g 1   famotidine (PEPCID) 20 MG tablet Take 20 mg by mouth 2 (two) times daily as needed for heartburn or indigestion.     guaiFENesin (MUCINEX) 600 MG 12 hr tablet Take 600 mg by mouth 2 (two) times daily as needed.     hydrocortisone (ANUSOL-HC) 2.5 % rectal cream Apply 1 Application topically at bedtime. To affected area for 10 days 30 g 0   irbesartan (AVAPRO) 300 MG tablet TAKE 1 TABLET BY MOUTH DAILY (Patient taking differently: Take 300 mg by mouth daily.) 90 tablet 2   ketoconazole (NIZORAL) 2 % cream Apply 1 Application topically daily. To affected area 30 g 0    loperamide (IMODIUM) 2 MG capsule Take 2 mg by mouth as needed for diarrhea or loose stools.     MAGNESIUM LACTATE PO Take by mouth 2 (two) times daily.     methotrexate 50 MG/2ML injection Inject 0.6 mLs (15 mg total) into the skin once a week. 4 mL 2   Na Sulfate-K Sulfate-Mg Sulf 17.5-3.13-1.6 GM/177ML SOLN See admin instructions.     triamcinolone (NASACORT) 55 MCG/ACT AERO nasal inhaler Place 2 sprays into the nose daily. 1 each 12   Tuberculin-Allergy Syringes 27G X 1/2" 1 ML MISC Use 1 syringe once weekly to inject methotrexate. 25 each 1   diltiazem (CARDIZEM CD) 180 MG 24 hr capsule Take 1 capsule (180 mg total) by mouth daily. 90 capsule 3   No current facility-administered medications on file prior to visit.    Allergies  Allergen Reactions   Antihistamines, Chlorpheniramine-Type Other (See Comments)    Reaction:  Makes pt hyper    Atenolol Hypertension   Cefuroxime Axetil Other (See Comments)    Upset stomach   Ciprofloxacin Other (See Comments)    Upset stomach   Co Q 10 [Coenzyme Q10] Other (See Comments)    Reaction:  GI upset    Crestor [Rosuvastatin Calcium] Other (See Comments)    Reaction:  Myalgias    Dextromethorphan-Guaifenesin Other (  See Comments)    Reaction:  Made pt jittery    Epinephrine Hives   Ramipril Other (See Comments)    Upset stomach   Sulfamethoxazole-Trimethoprim Other (See Comments)    Upset stomach   Vitamin D Analogs Other (See Comments)    Reaction:  GI upset     Past Medical History:  Diagnosis Date   A-fib (HCC)    Anginal pain (HCC)    Aortic insufficiency    a. 02/2020 Echo: EF 60-65%, no rwma, Gr1 DD, nl RV fxn, mildly dil LA, triv AI, Asc Ao 39mm; b. 04/2021 Echo: EF 60-65%, no rwma, GrI DD, nl RV fxn, mildly dil LA, AI not visualized. Mild-mod AoV sclerosis w/o stenosis.   Arthritis    Knees   B12 deficiency    Mild   Bucket-handle tear of lateral meniscus of left knee as current injury 12/30/2012   Cancer (HCC)    Skin,  basal cell on nose and eyelid   Colitis    DJD (degenerative joint disease)    Low back   Dyspepsia    GERD (gastroesophageal reflux disease)    Heart murmur    Heart palpitations    History of cardiac cath    a. 02/2017 Cath: Nl cors. Nl LV fxn. Abd Aogram w/o RAS.   HOH (hard of hearing)    Hyperlipidemia    Hypertension    IBS (irritable bowel syndrome)    Lactose intolerance    Mixed incontinence    PONV (postoperative nausea and vomiting)    Vulvar irritation    from urine pad, uses Triamcinolone oint.    Past Surgical History:  Procedure Laterality Date   ABDOMINAL AORTOGRAM N/A 03/15/2017   Procedure: Abdominal Aortogram;  Surgeon: Iran Ouch, MD;  Location: [facility] INVASIVE CV LAB;  Service: Cardiovascular;  Laterality: N/A;   ABDOMINAL HYSTERECTOMY  1982   Large fibroids and bleeding   APPENDECTOMY     BILATERAL SALPINGOOPHORECTOMY  1986   BONE GRAFT HIP ILIAC CREST     To Left arm   BREAST REDUCTION SURGERY     BREAST SURGERY     CARDIAC CATHETERIZATION     CATARACT EXTRACTION W/PHACO Left 07/07/2016   Procedure: CATARACT EXTRACTION PHACO AND INTRAOCULAR LENS PLACEMENT (IOC);  Surgeon: Galen Manila, MD;  Location: [facility] ORS;  Service: Ophthalmology;  Laterality: Left;  [place] 01:10 AP% 18.3 CDE 12.91 Fluid pak lot # [id number]   CATARACT EXTRACTION W/PHACO Right 07/28/2016   Procedure: CATARACT EXTRACTION PHACO AND INTRAOCULAR LENS PLACEMENT (IOC);  Surgeon: Galen Manila, MD;  Location: [facility] ORS;  Service: Ophthalmology;  Laterality: Right;  [place] 01:26 AP% 18.5 CDE 16.12 Fluid pack lot # [id number]   COLONOSCOPY  07/2016   Dr. Lottie Mussel   COLONOSCOPY WITH PROPOFOL N/A 04/01/2023   Procedure: COLONOSCOPY WITH PROPOFOL;  Surgeon: Toney Reil, MD;  Location: [facility] ENDOSCOPY;  Service: Gastroenterology;  Laterality: N/A;   DILATION AND CURETTAGE OF UTERUS     miscarragesx2   ESOPHAGOGASTRODUODENOSCOPY     Polyps   HEMORRHOID SURGERY  2006   KNEE ARTHROSCOPY  WITH LATERAL MENISECTOMY Left 12/30/2012   Procedure: KNEE ARTHROSCOPY WITH LATERAL MENISECTOMY;  Surgeon: Eulas Post, MD;  Location:  SURGERY CENTER;  Service: Orthopedics;  Laterality: Left;  LEFT KNEE SCOPE LATERAL MENISCECTOMY   LEFT HEART CATH AND CORONARY ANGIOGRAPHY N/A 03/15/2017   Procedure: Left Heart Cath and Coronary Angiography;  Surgeon: Iran Ouch, MD;  Location: [facility] INVASIVE CV LAB;  Service: Cardiovascular;  Laterality: N/A;   MOUTH SURGERY     skin cancer eyelid  2012   TONSILLECTOMY      Family History  Problem Relation Age of Onset   Cancer Mother        Colon   Heart disease Father        CAD   Diabetes Sister    Hypothyroidism Sister    Diabetes Brother    Leukemia Brother    Esophageal cancer Brother    Cancer Paternal Aunt        Breast   Diabetes Sister    Alzheimer's disease Sister    Diabetes Brother    Breast cancer Daughter     Social History   Socioeconomic History   Marital status: Married    Spouse name: Not on file   Number of children: 2   Years of education: Not on file   Highest education level: Not on file  Occupational History   Occupation: IT trainer: RETIRED  Tobacco Use   Smoking status: Former    Packs/day: 1.00    Years: 12.00    Additional pack years: 0.00    Total pack years: 12.00    Types: Cigarettes    Quit date: 11/24/1963    Years since quitting: 59.4   Smokeless tobacco: Never  Vaping Use   Vaping Use: Never used  Substance and Sexual Activity   Alcohol use: No    Alcohol/week: 0.0 standard drinks of alcohol   Drug use: No   Sexual activity: Not Currently  Other Topics Concern   Not on file  Social History Narrative   Wellsite geologist.     Social Determinants of Health   Financial Resource Strain: Low Risk  (12/25/2022)   Overall Financial Resource Strain (CARDIA)    Difficulty of Paying Living Expenses: Not hard at all  Food Insecurity: No Food Insecurity (03/15/2023)    Hunger Vital Sign    Worried About Running Out of Food in the Last Year: Never true    Ran Out of Food in the Last Year: Never true  Transportation Needs: No Transportation Needs (03/15/2023)   PRAPARE - Administrator, Civil Service (Medical): No    Lack of Transportation (Non-Medical): No  Physical Activity: Insufficiently Active (12/25/2022)   Exercise Vital Sign    Days of Exercise per Week: 7 days    Minutes of Exercise per Session: 10 min  Stress: No Stress Concern Present (12/25/2022)   [employer] of Occupational Health - Occupational Stress Questionnaire    Feeling of Stress : Not at all  Social Connections: Moderately Isolated (12/25/2022)   Social Connection and Isolation Panel [NHANES]    Frequency of Communication with Friends and Family: More than three times a week    Frequency of Social Gatherings with Friends and Family: More than three times a week    Attends Religious Services: Never    Database administrator or Organizations: No    Attends Banker Meetings: Never    Marital Status: Married  Catering manager Violence: Not At Risk (03/13/2023)   Humiliation, Afraid, Rape, and Kick questionnaire    Fear of Current or Ex-Partner: No    Emotionally Abused: No    Physically Abused: No    Sexually Abused: No   Review of Systems In again 9 days ago--itching, cramping, cloudy urine Symptoms better on the antibiotic--then worsened as soon as it ended  Objective:   Physical Exam Constitutional:      Appearance: Normal appearance.  Skin:    Comments: Superficial skin tear along lateral left forearm Some devitalized skin No inflammation  Neurological:     Mental Status: She is alert.            Assessment & Plan:

## 2023-04-21 NOTE — Assessment & Plan Note (Signed)
Redressed with silvadene and non stick dressing Discussed cleaning daily and Rx given for silvadene Needs Td booster

## 2023-04-21 NOTE — Assessment & Plan Note (Signed)
Symptoms recur as soon as she stops the antibiotics Plans to go back to urologist Will send to PCP also

## 2023-04-27 ENCOUNTER — Other Ambulatory Visit: Payer: Self-pay | Admitting: Family Medicine

## 2023-04-27 ENCOUNTER — Telehealth: Payer: Self-pay | Admitting: *Deleted

## 2023-04-27 ENCOUNTER — Telehealth: Payer: Self-pay

## 2023-04-27 ENCOUNTER — Other Ambulatory Visit (INDEPENDENT_AMBULATORY_CARE_PROVIDER_SITE_OTHER): Payer: Medicare Other

## 2023-04-27 DIAGNOSIS — N390 Urinary tract infection, site not specified: Secondary | ICD-10-CM | POA: Diagnosis not present

## 2023-04-27 LAB — POC URINALSYSI DIPSTICK (AUTOMATED)
Bilirubin, UA: NEGATIVE
Blood, UA: NEGATIVE
Glucose, UA: NEGATIVE
Ketones, UA: NEGATIVE
Nitrite, UA: NEGATIVE
Protein, UA: NEGATIVE
Spec Grav, UA: 1.01 (ref 1.010–1.025)
Urobilinogen, UA: 0.2 E.U./dL
pH, UA: 6 (ref 5.0–8.0)

## 2023-04-27 NOTE — Telephone Encounter (Signed)
I spoke to pt about coming in today just for a quick urine. She said she has too much going on right now to be able to come in today. I told her that would be fine. We were trying to think ahead.

## 2023-04-27 NOTE — Telephone Encounter (Signed)
Transition Care Management Follow-up Telephone Call Date of discharge and from where: [facility]   04/20/2023 How have you been since you were released from the hospital? Patient is feeling much better , patient has already has an appt with pcp  Any questions or concerns? No  Items Reviewed: Did the pt receive and understand the discharge instructions provided? Yes  Medications obtained and verified? Yes  Other? Yes  Any new allergies since your discharge? Yes  Dietary orders reviewed? No Do you have support at home? Yes     Follow up appointments reviewed:  PCP Hospital f/u appt confirmed? Yes  Are transportation arrangements needed? No  If their condition worsens, is the pt aware to call PCP or go to the Emergency Dept.? Yes Was the patient provided with contact information for the PCP's office or ED? Yes Was to pt encouraged to call back with questions or concerns? Yes

## 2023-04-27 NOTE — Telephone Encounter (Signed)
-----   Message from [name], MD sent at 04/26/2023  9:15 PM EDT ----- Please ask pt if she could drop off urine sample Tuesday so we can get a culture going before I see her Wednesday  What was her most recent antibiotic?  Thanks  ----- Message ----- From: Lonia Blood, CMA Sent: 04/26/2023   4:48 PM EDT To: [name], MD  Called and spoke with patient she states she has an appointment to see urology on 06/10/23. Patient states she is having UTI symptoms right now, she is dealing with burning with urination and cramping. Patient has appointment scheduled for 04/28/23 (Wednesday) to follow up.  ----- Message ----- From: [name], MD Sent: 04/21/2023   7:41 PM EDT To: Shon Millet, CMA  Please find out if pt has a urology appt or needs a referral? Thanks  Any uti symptoms now?    (I am out of town, thanks) ----- Message ----- From: Karie Schwalbe, MD Sent: 04/21/2023  12:19 PM EDT To: [name], MD  She is having recurrent bladder symptoms as soon as she stops antibiotics Plans to go back to urology--but might need to consider daily prophylaxis

## 2023-04-27 NOTE — Telephone Encounter (Signed)
Great! I will take care of her between patients when she gets her.

## 2023-04-27 NOTE — Telephone Encounter (Signed)
Patient returned call, stated she was abe to clear some time to come by around/ after 2 to give the sample. Please advies, thank you

## 2023-04-27 NOTE — Telephone Encounter (Signed)
Spoke to pt. She said she will wait until her appt tomorrow before getting medication.

## 2023-04-27 NOTE — Telephone Encounter (Signed)
Ua has wbc  Sending for culture-thanks   I pended px for keflex if she wants to pick up now and take until cx returns  Please send to pharmacy of choice  I will see her soon as planned

## 2023-04-28 ENCOUNTER — Ambulatory Visit (INDEPENDENT_AMBULATORY_CARE_PROVIDER_SITE_OTHER): Payer: Medicare Other | Admitting: Family Medicine

## 2023-04-28 ENCOUNTER — Encounter: Payer: Self-pay | Admitting: Family Medicine

## 2023-04-28 ENCOUNTER — Inpatient Hospital Stay: Payer: Medicare Other | Admitting: Family Medicine

## 2023-04-28 VITALS — BP 134/66 | HR 68 | Temp 97.7°F | Ht 64.5 in | Wt 175.0 lb

## 2023-04-28 DIAGNOSIS — S40812A Abrasion of left upper arm, initial encounter: Secondary | ICD-10-CM | POA: Insufficient documentation

## 2023-04-28 DIAGNOSIS — N3000 Acute cystitis without hematuria: Secondary | ICD-10-CM | POA: Diagnosis not present

## 2023-04-28 DIAGNOSIS — S40812S Abrasion of left upper arm, sequela: Secondary | ICD-10-CM

## 2023-04-28 DIAGNOSIS — S0990XS Unspecified injury of head, sequela: Secondary | ICD-10-CM

## 2023-04-28 DIAGNOSIS — S0990XA Unspecified injury of head, initial encounter: Secondary | ICD-10-CM | POA: Insufficient documentation

## 2023-04-28 MED ORDER — CEPHALEXIN 500 MG PO CAPS
500.0000 mg | ORAL_CAPSULE | Freq: Two times a day (BID) | ORAL | 0 refills | Status: DC
Start: 2023-04-28 — End: 2023-04-29

## 2023-04-28 NOTE — Patient Instructions (Addendum)
Watch for return of headache Any dizziness, nausea, confusion - let us know  If severe go to the ER   Drink your water Take the generic keflex as directed  We will call with culture result and plan   Keep urology appt   If symptoms get worse in meantime let me know right away   Keep wound clean and dry  Antibiotic ointment is ok as needed  If worse redness/ swelling or pain let us know

## 2023-04-28 NOTE — Assessment & Plan Note (Signed)
Pt has had multiple utis the come back after abx  Positive ua  Keflex px Pending culture  Pending urology appt next month  Discussed fluids  ER precautions noted in detail  Will likely need proph abx while waiting for urology - will make plan when culture returns

## 2023-04-28 NOTE — Assessment & Plan Note (Signed)
From recent trip and fall Seen in ER Reviewed hospital records, lab results and studies in detail   Also reviewed note from Dr Alphonsus Sias Healing well  Discussed wound care Continue silvadene  Tetanus utd

## 2023-04-28 NOTE — Progress Notes (Signed)
Subjective:    Patient ID: Tammy Manning, female    DOB: 18-Jul-1935, 88 y.o.   MRN: [id number]  HPI Pt presents for f/u of ER visit for mechanical fall Also uti symptoms     Wt Readings from Last 3 Encounters:  04/28/23 175 lb (79.4 kg)  04/21/23 177 lb (80.3 kg)  04/20/23 175 lb 7.8 oz (79.6 kg)   29.57 kg/m  Vitals:   04/28/23 1056  BP: 134/66  Pulse: 68  Temp: 97.7 F (36.5 C)  SpO2: 98%     Was seen in ER at [facility] on 04/20/23 (fell on the 27th)  Presented after a fall  She tripped and hit her head on husband's knee and then the floor   (she turned indoors in house shoes and her foot did not piviot)  Caught herself with L arm- slid , then hit head again on floor Was able to get up herself  No syncope/ no LOC  Noted she takes eliquis  Noted slight headache and R eye seemed "foggy" but not painful      CT of head and CS both negative  Exam was reassuring   She followed up with Dr Alphonsus Sias on 5/29 Dressed skin tear on arm, given silvadene  Td updated   Follow up planned with [facility] (she sees Dr Druscilla Brownie)  Had a visit-was fine /nothing injured  Vision is back to normal  No headache now   Took her bandage off arm today   Uti symptoms (having frequent utis)  Lab Results  Component Value Date   COLORU light yellow 04/27/2023   CLARITYU slightly cloudy 04/27/2023   GLUCOSEUR Negative 04/27/2023   BILIRUBINUR negative 04/27/2023   KETONESU negative 04/27/2023   SPECGRAV 1.010 04/27/2023   RBCUR negative 04/27/2023   PHUR 6.0 04/27/2023   PROTEINUR Negative 04/27/2023   UROBILINOGEN 0.2 04/27/2023   LEUKOCYTESUR Large (3+) (A) 04/27/2023   Pending culture now  Last culture noted e coli resistant to amp and augmentin   Taking D mannose  Drinking lots of water   Dyruria  Burns to urinate  Some crampy feeling   No fever   Was therapeutic with keflex on 5/20   Next urology appt is July  Uses estrace vaginal cream and gemtesa in  the past   Patient Active Problem List   Diagnosis Date Noted   Abrasion of left arm 04/28/2023   Head injury 04/28/2023   Superficial injury of skin 04/21/2023   Dysuria 04/12/2023   Diverticulosis 03/21/2023   Acute colitis 03/13/2023   Chronic diastolic CHF (congestive heart failure) (HCC) 03/13/2023   Obesity (BMI 30-39.9) 03/13/2023   PAF (paroxysmal atrial fibrillation) (HCC) 03/13/2023   Recurrent UTI (urinary tract infection) 02/18/2023   Intertrigo 02/16/2023   Hemorrhoids 02/02/2023   Urinary incontinence 02/02/2023   Acute cystitis 01/26/2023   Dilated aortic root (HCC) 01/26/2023   Osteopenia 01/26/2023   Multinodular goiter 02/27/2022   Encounter for screening mammogram for breast cancer 01/23/2022   High risk medication use 10/07/2021   Atrial fibrillation with RVR (HCC) 04/28/2021   Hypertrophic toenail 04/22/2021   Seropositive rheumatoid arthritis (HCC) 11/25/2020   Bilateral hand pain 11/25/2020   Bilateral shoulder pain 10/16/2020   Joint pain 10/16/2020   Estrogen deficiency 04/30/2020   Dry mouth 03/15/2020   Osteoarthritis of right hip 01/02/2020   Right thyroid nodule 05/12/2018   History of TIA (transient ischemic attack) 05/09/2018   Aortic insufficiency 09/10/2015   Stress reaction  09/08/2015   Encounter for Medicare annual wellness exam 02/28/2013   Bucket-handle tear of lateral meniscus of left knee as current injury 12/30/2012   Episodic atrial fibrillation (HCC) 11/17/2012   Hearing loss of both ears 01/14/2012   B12 deficiency 03/17/2010   GERD (gastroesophageal reflux disease) 03/17/2010   POSTMENOPAUSAL STATUS 08/20/2008   Essential hypertension 07/19/2007   Hyperlipidemia 06/24/2007   IBS 06/24/2007   DERMATITIS, ATOPIC 06/24/2007   SKIN CANCER, HX OF 06/24/2007   Past Medical History:  Diagnosis Date   A-fib (HCC)    Anginal pain (HCC)    Aortic insufficiency    a. 02/2020 Echo: EF 60-65%, no rwma, Gr1 DD, nl RV fxn, mildly dil  LA, triv AI, Asc Ao 39mm; b. 04/2021 Echo: EF 60-65%, no rwma, GrI DD, nl RV fxn, mildly dil LA, AI not visualized. Mild-mod AoV sclerosis w/o stenosis.   Arthritis    Knees   B12 deficiency    Mild   Bucket-handle tear of lateral meniscus of left knee as current injury 12/30/2012   Cancer (HCC)    Skin, basal cell on nose and eyelid   Colitis    DJD (degenerative joint disease)    Low back   Dyspepsia    GERD (gastroesophageal reflux disease)    Heart murmur    Heart palpitations    History of cardiac cath    a. 02/2017 Cath: Nl cors. Nl LV fxn. Abd Aogram w/o RAS.   HOH (hard of hearing)    Hyperlipidemia    Hypertension    IBS (irritable bowel syndrome)    Lactose intolerance    Mixed incontinence    PONV (postoperative nausea and vomiting)    Vulvar irritation    from urine pad, uses Triamcinolone oint.   Past Surgical History:  Procedure Laterality Date   ABDOMINAL AORTOGRAM N/A 03/15/2017   Procedure: Abdominal Aortogram;  Surgeon: Iran Ouch, MD;  Location: [facility];  Service: Cardiovascular;  Laterality: N/A;   ABDOMINAL HYSTERECTOMY  1982   Large fibroids and bleeding   APPENDECTOMY     BILATERAL SALPINGOOPHORECTOMY  1986   BONE GRAFT HIP ILIAC CREST     To Left arm   BREAST REDUCTION SURGERY     BREAST SURGERY     CARDIAC CATHETERIZATION     CATARACT EXTRACTION W/PHACO Left 07/07/2016   Procedure: CATARACT EXTRACTION PHACO AND INTRAOCULAR LENS PLACEMENT (IOC);  Surgeon: Galen Manila, MD;  Location: [facility];  Service: Ophthalmology;  Laterality: Left;  [place] 01:10 AP% 18.3 CDE 12.91 Fluid pak lot # [id number]   CATARACT EXTRACTION W/PHACO Right 07/28/2016   Procedure: CATARACT EXTRACTION PHACO AND INTRAOCULAR LENS PLACEMENT (IOC);  Surgeon: Galen Manila, MD;  Location: [facility];  Service: Ophthalmology;  Laterality: Right;  [place] 01:26 AP% 18.5 CDE 16.12 Fluid pack lot # [id number]   COLONOSCOPY  07/2016   Dr. Lottie Mussel   COLONOSCOPY WITH PROPOFOL  N/A 04/01/2023   Procedure: COLONOSCOPY WITH PROPOFOL;  Surgeon: Toney Reil, MD;  Location: [facility];  Service: Gastroenterology;  Laterality: N/A;   DILATION AND CURETTAGE OF UTERUS     miscarragesx2   ESOPHAGOGASTRODUODENOSCOPY     Polyps   HEMORRHOID SURGERY  2006   KNEE ARTHROSCOPY WITH LATERAL MENISECTOMY Left 12/30/2012   Procedure: KNEE ARTHROSCOPY WITH LATERAL MENISECTOMY;  Surgeon: Eulas Post, MD;  Location: [facility];  Service: Orthopedics;  Laterality: Left;  LEFT KNEE SCOPE LATERAL MENISCECTOMY   LEFT HEART CATH AND  CORONARY ANGIOGRAPHY N/A 03/15/2017   Procedure: Left Heart Cath and Coronary Angiography;  Surgeon: Iran Ouch, MD;  Location: [facility];  Service: Cardiovascular;  Laterality: N/A;   MOUTH SURGERY     skin cancer eyelid  2012   TONSILLECTOMY     Social History   Tobacco Use   Smoking status: Former    Packs/day: 1.00    Years: 12.00    Additional pack years: 0.00    Total pack years: 12.00    Types: Cigarettes    Quit date: 11/24/1963    Years since quitting: 59.4   Smokeless tobacco: Never  Vaping Use   Vaping Use: Never used  Substance Use Topics   Alcohol use: No    Alcohol/week: 0.0 standard drinks of alcohol   Drug use: No   Family History  Problem Relation Age of Onset   Cancer Mother        Colon   Heart disease Father        CAD   Diabetes Sister    Hypothyroidism Sister    Diabetes Brother    Leukemia Brother    Esophageal cancer Brother    Cancer Paternal Aunt        Breast   Diabetes Sister    Alzheimer's disease Sister    Diabetes Brother    Breast cancer Daughter    Allergies  Allergen Reactions   Antihistamines, Chlorpheniramine-Type Other (See Comments)    Reaction:  Makes pt hyper    Atenolol Hypertension   Cefuroxime Axetil Other (See Comments)    Upset stomach   Ciprofloxacin Other (See Comments)    Upset stomach   Co Q 10 [Coenzyme Q10] Other (See Comments)     Reaction:  GI upset    Crestor [Rosuvastatin Calcium] Other (See Comments)    Reaction:  Myalgias    Dextromethorphan-Guaifenesin Other (See Comments)    Reaction:  Made pt jittery    Epinephrine Hives   Ramipril Other (See Comments)    Upset stomach   Sulfamethoxazole-Trimethoprim Other (See Comments)    Upset stomach   Vitamin D Analogs Other (See Comments)    Reaction:  GI upset    Current Outpatient Medications on File Prior to Visit  Medication Sig Dispense Refill   acetaminophen (TYLENOL) 325 MG suppository Place 325 mg rectally every 8 (eight) hours as needed.     atorvastatin (LIPITOR) 10 MG tablet TAKE 1 TABLET(10 MG) BY MOUTH DAILY (Patient taking differently: Take 10 mg by mouth daily.) 90 tablet 2   Calcium Carbonate-Vitamin D (CALCIUM 600+D PO) Take 1 tablet by mouth daily.      clobetasol ointment (TEMOVATE) 0.05 % Apply 1 application topically 2 (two) times daily as needed.     clorazepate (TRANXENE) 7.5 MG tablet Take 1 tablet (7.5 mg total) by mouth daily as needed. 30 tablet 0   cyanocobalamin (,VITAMIN B-12,) 1000 MCG/ML injection Inject 1,000 mcg into the muscle every 30 (thirty) days.      ELIQUIS 5 MG TABS tablet TAKE 1 TABLET BY MOUTH TWICE DAILY 180 tablet 1   estradiol (ESTRACE) 0.1 MG/GM vaginal cream Apply a pea-sized amount to fingertip and wipe in vaginal introitus twice weekly 42.5 g 1   famotidine (PEPCID) 20 MG tablet Take 20 mg by mouth 2 (two) times daily as needed for heartburn or indigestion.     guaiFENesin (MUCINEX) 600 MG 12 hr tablet Take 600 mg by mouth 2 (two) times daily as needed.  hydrocortisone (ANUSOL-HC) 2.5 % rectal cream Apply 1 Application topically at bedtime. To affected area for 10 days 30 g 0   irbesartan (AVAPRO) 300 MG tablet TAKE 1 TABLET BY MOUTH DAILY (Patient taking differently: Take 300 mg by mouth daily.) 90 tablet 2   ketoconazole (NIZORAL) 2 % cream Apply 1 Application topically daily. To affected area 30 g 0    loperamide (IMODIUM) 2 MG capsule Take 2 mg by mouth as needed for diarrhea or loose stools.     MAGNESIUM LACTATE PO Take by mouth 2 (two) times daily.     methotrexate 50 MG/2ML injection Inject 0.6 mLs (15 mg total) into the skin once a week. 4 mL 2   Na Sulfate-K Sulfate-Mg Sulf 17.5-3.13-1.6 GM/177ML SOLN See admin instructions.     silver sulfADIAZINE (SILVADENE) 1 % cream Apply 1 Application topically daily. 50 g 0   triamcinolone (NASACORT) 55 MCG/ACT AERO nasal inhaler Place 2 sprays into the nose daily. 1 each 12   Tuberculin-Allergy Syringes 27G X 1/2" 1 ML MISC Use 1 syringe once weekly to inject methotrexate. 25 each 1   diltiazem (CARDIZEM CD) 180 MG 24 hr capsule Take 1 capsule (180 mg total) by mouth daily. 90 capsule 3   No current facility-administered medications on file prior to visit.     Review of Systems  Constitutional:  Negative for activity change, appetite change, fatigue, fever and unexpected weight change.  HENT:  Negative for congestion, ear pain, rhinorrhea, sinus pressure and sore throat.   Eyes:  Negative for pain, redness and visual disturbance.  Respiratory:  Negative for cough, shortness of breath and wheezing.   Cardiovascular:  Negative for chest pain and palpitations.  Gastrointestinal:  Negative for abdominal pain, blood in stool, constipation and diarrhea.  Endocrine: Negative for polydipsia and polyuria.  Genitourinary:  Negative for dysuria, frequency and urgency.  Musculoskeletal:  Negative for arthralgias, back pain and myalgias.  Skin:  Positive for wound. Negative for pallor and rash.  Allergic/Immunologic: Negative for environmental allergies.  Neurological:  Negative for dizziness, syncope and headaches.  Hematological:  Negative for adenopathy. Does not bruise/bleed easily.  Psychiatric/Behavioral:  Negative for decreased concentration and dysphoric mood. The patient is not nervous/anxious.        Objective:   Physical  Exam Constitutional:      General: She is not in acute distress.    Appearance: Normal appearance. She is well-developed. She is obese. She is not ill-appearing or diaphoretic.  HENT:     Head: Normocephalic and atraumatic.  Eyes:     Conjunctiva/sclera: Conjunctivae normal.     Pupils: Pupils are equal, round, and reactive to light.  Neck:     Thyroid: No thyromegaly.     Vascular: No carotid bruit or JVD.  Cardiovascular:     Rate and Rhythm: Normal rate.     Heart sounds: Normal heart sounds.     No gallop.  Pulmonary:     Effort: Pulmonary effort is normal. No respiratory distress.     Breath sounds: Normal breath sounds. No wheezing or rales.  Abdominal:     General: There is no distension or abdominal bruit.     Palpations: Abdomen is soft. There is no mass.     Tenderness: There is no abdominal tenderness. There is no right CVA tenderness or left CVA tenderness.     Comments: No suprapubic tenderness or fullness    Musculoskeletal:     Cervical back: Normal range of motion  and neck supple.     Right lower leg: No edema.     Left lower leg: No edema.  Lymphadenopathy:     Cervical: No cervical adenopathy.  Skin:    General: Skin is warm and dry.     Coloration: Skin is not pale.     Findings: No rash.     Comments: Healing/scabbed abrasoin on L posterior lower arm  No erythema  Non tender     Neurological:     General: No focal deficit present.     Mental Status: She is alert.     Cranial Nerves: No cranial nerve deficit.     Sensory: No sensory deficit.     Motor: No weakness.     Coordination: Coordination normal.     Gait: Gait normal.     Deep Tendon Reflexes: Reflexes are normal and symmetric. Reflexes normal.  Psychiatric:        Mood and Affect: Mood normal.           Assessment & Plan:   Problem List Items Addressed This Visit       Musculoskeletal and Integument   Abrasion of left arm    From recent trip and fall Seen in ER Reviewed  hospital records, lab results and studies in detail   Also reviewed note from Dr Alphonsus Sias Healing well  Discussed wound care Continue silvadene  Tetanus utd         Genitourinary   Acute cystitis - Primary    Pt has had multiple utis the come back after abx  Positive ua  Keflex px Pending culture  Pending urology appt next month  Discussed fluids  ER precautions noted in detail  Will likely need proph abx while waiting for urology - will make plan when culture returns        Other   Head injury    From recent trip/fall in home Reviewed hospital records, lab results and studies in detail  CT of head and CS are reassuring   No concussion symptoms  Given precautions re head injury / watch for signs and symptoms of concussion and subdural (she is on blood thnner) Discussed fall prevention in home

## 2023-04-28 NOTE — Assessment & Plan Note (Signed)
From recent trip/fall in home Reviewed hospital records, lab results and studies in detail  CT of head and CS are reassuring   No concussion symptoms  Given precautions re head injury / watch for signs and symptoms of concussion and subdural (she is on blood thnner) Discussed fall prevention in home

## 2023-04-29 ENCOUNTER — Encounter: Payer: Self-pay | Admitting: Family Medicine

## 2023-04-29 ENCOUNTER — Telehealth: Payer: Self-pay | Admitting: Family Medicine

## 2023-04-29 LAB — URINE CULTURE
MICRO NUMBER:: 15038883
SPECIMEN QUALITY:: ADEQUATE

## 2023-04-29 MED ORDER — NITROFURANTOIN MONOHYD MACRO 100 MG PO CAPS: 100.0000 mg | ORAL_CAPSULE | Freq: Two times a day (BID) | ORAL | 0 refills | Status: DC

## 2023-04-29 NOTE — Telephone Encounter (Signed)
Your uti is not sensitve to keflex Stop it  I sent in macrobid  Take it twice daily for 7 days  Then, it if helps  I will px once daily (just one pill) to prevent utis, until you see the urologist   Let me know when you are done with the week / update [place] with how you feel and I will px the prophylactic dose

## 2023-04-30 NOTE — Telephone Encounter (Signed)
Pt notified of Urine cx results and Dr. Royden Purl comments. Pt will update [place] in one week on how she's doing, she will stop keflex and start macrobid

## 2023-05-05 ENCOUNTER — Encounter: Payer: Self-pay | Admitting: Family Medicine

## 2023-05-05 MED ORDER — NITROFURANTOIN MONOHYD MACRO 100 MG PO CAPS: 100.0000 mg | ORAL_CAPSULE | Freq: Every day | ORAL | 0 refills | Status: DC

## 2023-05-19 ENCOUNTER — Ambulatory Visit (INDEPENDENT_AMBULATORY_CARE_PROVIDER_SITE_OTHER): Payer: Medicare Other | Admitting: Urology

## 2023-05-19 ENCOUNTER — Encounter: Payer: Self-pay | Admitting: Urology

## 2023-05-19 VITALS — BP 135/63 | HR 69 | Ht 64.0 in | Wt 175.0 lb

## 2023-05-19 DIAGNOSIS — N390 Urinary tract infection, site not specified: Secondary | ICD-10-CM | POA: Diagnosis not present

## 2023-05-19 DIAGNOSIS — Z09 Encounter for follow-up examination after completed treatment for conditions other than malignant neoplasm: Secondary | ICD-10-CM

## 2023-05-19 DIAGNOSIS — Z87440 Personal history of urinary (tract) infections: Secondary | ICD-10-CM | POA: Diagnosis not present

## 2023-05-19 LAB — MICROSCOPIC EXAMINATION: Epithelial Cells (non renal): >10 /hpf — AB (ref 0–10)

## 2023-05-19 LAB — URINALYSIS, COMPLETE
Bilirubin, UA: NEGATIVE
Glucose, UA: NEGATIVE
Ketones, UA: NEGATIVE
Nitrite, UA: NEGATIVE
Protein,UA: NEGATIVE
RBC, UA: NEGATIVE
Specific Gravity, UA: 1.015 (ref 1.005–1.030)
Urobilinogen, Ur: 0.2 mg/dL (ref 0.2–1.0)
pH, UA: 6 (ref 5.0–7.5)

## 2023-05-19 MED ORDER — TRIMETHOPRIM 100 MG PO TABS: 100.0000 mg | ORAL_TABLET | Freq: Every day | ORAL | 2 refills | Status: DC

## 2023-05-19 NOTE — Progress Notes (Signed)
I, Tammy Manning, acting as a Neurosurgeon for Tammy Altes, MD., have documented all relevant documentation on the behalf of Tammy Altes, MD, as directed by  Tammy Altes, MD while in the presence of Tammy Altes, MD.   05/19/2023 11:01 AM   Tammy Manning March 07, 1935 [id number]  Referring provider: [name], MD [address] [place],  [place] [address]  Chief Complaint  Patient presents with   Follow-up   Recurrent UTI   Urologic History:  Recurrent UTI -Started on low-dose antibiotic suppression with Keflex and was switched to nitrofurantoin by our PA due to GI side effects with Keflex -Is taking d-mannose and low-dose vaginal estrogen   HPI: 88 y.o. female presents for annual follow-up visit.  He has had 2 positive cultures for E. Coli associated with symptoms in May 2024, treated by PCP. She was kept on a daily dose of Macrobid and has been asymptomatic. Continues D-mannose. He had an ED visit April 2024 for abdominal pain and a CT abdomen/pelvis with contrast showed no GU abnormalities or air in the bladder.   PMH: Past Medical History:  Diagnosis Date   A-fib (HCC)    Anginal pain (HCC)    Aortic insufficiency    a. 02/2020 Echo: EF 60-65%, no rwma, Gr1 DD, nl RV fxn, mildly dil LA, triv AI, Asc Ao 39mm; b. 04/2021 Echo: EF 60-65%, no rwma, GrI DD, nl RV fxn, mildly dil LA, AI not visualized. Mild-mod AoV sclerosis w/o stenosis.   Arthritis    Knees   B12 deficiency    Mild   Bucket-handle tear of lateral meniscus of left knee as current injury 12/30/2012   Cancer (HCC)    Skin, basal cell on nose and eyelid   Colitis    DJD (degenerative joint disease)    Low back   Dyspepsia    GERD (gastroesophageal reflux disease)    Heart murmur    Heart palpitations    History of cardiac cath    a. 02/2017 Cath: Nl cors. Nl LV fxn. Abd Aogram w/o RAS.   HOH (hard of hearing)    Hyperlipidemia    Hypertension    IBS (irritable bowel syndrome)     Lactose intolerance    Mixed incontinence    PONV (postoperative nausea and vomiting)    Vulvar irritation    from urine pad, uses Triamcinolone oint.    Surgical History: Past Surgical History:  Procedure Laterality Date   ABDOMINAL AORTOGRAM N/A 03/15/2017   Procedure: Abdominal Aortogram;  Surgeon: Iran Ouch, MD;  Location: [facility];  Service: Cardiovascular;  Laterality: N/A;   ABDOMINAL HYSTERECTOMY  1982   Large fibroids and bleeding   APPENDECTOMY     BILATERAL SALPINGOOPHORECTOMY  1986   BONE GRAFT HIP ILIAC CREST     To Left arm   BREAST REDUCTION SURGERY     BREAST SURGERY     CARDIAC CATHETERIZATION     CATARACT EXTRACTION W/PHACO Left 07/07/2016   Procedure: CATARACT EXTRACTION PHACO AND INTRAOCULAR LENS PLACEMENT (IOC);  Surgeon: Galen Manila, MD;  Location: [facility];  Service: Ophthalmology;  Laterality: Left;  [place] 01:10 AP% 18.3 CDE 12.91 Fluid pak lot # [id number]   CATARACT EXTRACTION W/PHACO Right 07/28/2016   Procedure: CATARACT EXTRACTION PHACO AND INTRAOCULAR LENS PLACEMENT (IOC);  Surgeon: Galen Manila, MD;  Location: [facility];  Service: Ophthalmology;  Laterality: Right;  [place] 01:26 AP% 18.5 CDE 16.12 Fluid pack lot #  [id number]   COLONOSCOPY  07/2016   Dr. Lottie Mussel   COLONOSCOPY WITH PROPOFOL N/A 04/01/2023   Procedure: COLONOSCOPY WITH PROPOFOL;  Surgeon: Toney Reil, MD;  Location: [facility];  Service: Gastroenterology;  Laterality: N/A;   DILATION AND CURETTAGE OF UTERUS     miscarragesx2   ESOPHAGOGASTRODUODENOSCOPY     Polyps   HEMORRHOID SURGERY  2006   KNEE ARTHROSCOPY WITH LATERAL MENISECTOMY Left 12/30/2012   Procedure: KNEE ARTHROSCOPY WITH LATERAL MENISECTOMY;  Surgeon: Eulas Post, MD;  Location: [facility];  Service: Orthopedics;  Laterality: Left;  LEFT KNEE SCOPE LATERAL MENISCECTOMY   LEFT HEART CATH AND CORONARY ANGIOGRAPHY N/A 03/15/2017   Procedure: Left Heart Cath and Coronary Angiography;   Surgeon: Iran Ouch, MD;  Location: [facility];  Service: Cardiovascular;  Laterality: N/A;   MOUTH SURGERY     skin cancer eyelid  2012   TONSILLECTOMY      Home Medications:  Allergies as of 05/19/2023       Reactions   Antihistamines, Chlorpheniramine-type Other (See Comments)   Reaction:  Makes pt hyper    Atenolol Hypertension   Cefuroxime Axetil Other (See Comments)   Upset stomach   Ciprofloxacin Other (See Comments)   Upset stomach   Co Q 10 [coenzyme Q10] Other (See Comments)   Reaction:  GI upset    Crestor [rosuvastatin Calcium] Other (See Comments)   Reaction:  Myalgias   Dextromethorphan-guaifenesin Other (See Comments)   Reaction:  Made pt jittery    Epinephrine Hives   Ramipril Other (See Comments)   Upset stomach   Sulfamethoxazole-trimethoprim Other (See Comments)   Upset stomach   Vitamin D Analogs Other (See Comments)   Reaction:  GI upset         Medication List        Accurate as of May 19, 2023 11:01 AM. If you have any questions, ask your nurse or doctor.          STOP taking these medications    ketoconazole 2 % cream Commonly known as: NIZORAL Stopped by: Tammy Altes, MD   MAGNESIUM LACTATE PO Stopped by: Tammy Altes, MD       TAKE these medications    acetaminophen 325 MG suppository Commonly known as: TYLENOL Place 325 mg rectally every 8 (eight) hours as needed.   atorvastatin 10 MG tablet Commonly known as: LIPITOR TAKE 1 TABLET(10 MG) BY MOUTH DAILY What changed: See the new instructions.   CALCIUM 600+D PO Take 1 tablet by mouth daily.   clobetasol ointment 0.05 % Commonly known as: TEMOVATE Apply 1 application topically 2 (two) times daily as needed.   clorazepate 7.5 MG tablet Commonly known as: TRANXENE Take 1 tablet (7.5 mg total) by mouth daily as needed.   cyanocobalamin 1000 MCG/ML injection Commonly known as: VITAMIN B12 Inject 1,000 mcg into the muscle every 30 (thirty)  days.   diltiazem 180 MG 24 hr capsule Commonly known as: CARDIZEM CD Take 1 capsule (180 mg total) by mouth daily.   Eliquis 5 MG Tabs tablet Generic drug: apixaban TAKE 1 TABLET BY MOUTH TWICE DAILY   estradiol 0.1 MG/GM vaginal cream Commonly known as: ESTRACE Apply a pea-sized amount to fingertip and wipe in vaginal introitus twice weekly   famotidine 20 MG tablet Commonly known as: PEPCID Take 20 mg by mouth 2 (two) times daily as needed for heartburn or indigestion.   guaiFENesin 600 MG 12 hr tablet  Commonly known as: MUCINEX Take 600 mg by mouth 2 (two) times daily as needed.   hydrocortisone 2.5 % rectal cream Commonly known as: Anusol-HC Apply 1 Application topically at bedtime. To affected area for 10 days   irbesartan 300 MG tablet Commonly known as: AVAPRO TAKE 1 TABLET BY MOUTH DAILY What changed:  how much to take how to take this when to take this additional instructions   loperamide 2 MG capsule Commonly known as: IMODIUM Take 2 mg by mouth as needed for diarrhea or loose stools.   methotrexate 50 MG/2ML injection Inject 0.6 mLs (15 mg total) into the skin once a week.   Na Sulfate-K Sulfate-Mg Sulf 17.5-3.13-1.6 GM/177ML Soln See admin instructions.   nitrofurantoin (macrocrystal-monohydrate) 100 MG capsule Commonly known as: Macrobid Take 1 capsule (100 mg total) by mouth daily.   silver sulfADIAZINE 1 % cream Commonly known as: SILVADENE Apply 1 Application topically daily.   triamcinolone 55 MCG/ACT Aero nasal inhaler Commonly known as: NASACORT Place 2 sprays into the nose daily.   trimethoprim 100 MG tablet Commonly known as: TRIMPEX Take 1 tablet (100 mg total) by mouth daily. Started by: Tammy Altes, MD   Tuberculin-Allergy Syringes 27G X 1/2" 1 ML Misc Use 1 syringe once weekly to inject methotrexate.        Allergies:  Allergies  Allergen Reactions   Antihistamines, Chlorpheniramine-Type Other (See Comments)     Reaction:  Makes pt hyper    Atenolol Hypertension   Cefuroxime Axetil Other (See Comments)    Upset stomach   Ciprofloxacin Other (See Comments)    Upset stomach   Co Q 10 [Coenzyme Q10] Other (See Comments)    Reaction:  GI upset    Crestor [Rosuvastatin Calcium] Other (See Comments)    Reaction:  Myalgias    Dextromethorphan-Guaifenesin Other (See Comments)    Reaction:  Made pt jittery    Epinephrine Hives   Ramipril Other (See Comments)    Upset stomach   Sulfamethoxazole-Trimethoprim Other (See Comments)    Upset stomach   Vitamin D Analogs Other (See Comments)    Reaction:  GI upset     Family History: Family History  Problem Relation Age of Onset   Cancer Mother        Colon   Heart disease Father        CAD   Diabetes Sister    Hypothyroidism Sister    Diabetes Brother    Leukemia Brother    Esophageal cancer Brother    Cancer Paternal Aunt        Breast   Diabetes Sister    Alzheimer's disease Sister    Diabetes Brother    Breast cancer Daughter     Social History:  reports that she quit smoking about 59 years ago. Her smoking use included cigarettes. She has a 12.00 pack-year smoking history. She has never used smokeless tobacco. She reports that she does not drink alcohol and does not use drugs.   Physical Exam: BP 135/63   Pulse 69   Ht 5\' 4"  (1.626 m)   Wt 175 lb (79.4 kg)   BMI 30.04 kg/m   Constitutional:  Alert and oriented, No acute distress. HEENT: [place] AT, moist mucus membranes.  Trachea midline, no masses. Cardiovascular: No clubbing, cyanosis, or edema. Respiratory: Normal respiratory effort, no increased work of breathing. Psychiatric: Normal mood and affect.  Laboratory Data:  Urinalysis Dipstick 1+leukocytes Microscopy 11-30 WBC > 10 epis   Assessment &  Plan:    1. Recurrent UTI Doing well on a daily dose of Macrobid. We discussed that there is a black box warning for this medication for patients older than age 48 and the  potential association of pulmonary fibrosis, which is not reversible. Recommend discontinuing and will place on Trimethoprim 100 mg daily x3 months. Follow up in 3 months and consider stopping the Trimethoprim and a trial of Methenamine. Return as needed for recurrent UTI symptoms.      [facility] [address], [address] [place], [place] [address] [phone]

## 2023-05-20 ENCOUNTER — Ambulatory Visit: Payer: Medicare Other

## 2023-05-25 ENCOUNTER — Ambulatory Visit (INDEPENDENT_AMBULATORY_CARE_PROVIDER_SITE_OTHER): Payer: Medicare Other

## 2023-05-25 ENCOUNTER — Telehealth: Payer: Self-pay | Admitting: *Deleted

## 2023-05-25 DIAGNOSIS — E538 Deficiency of other specified B group vitamins: Secondary | ICD-10-CM | POA: Diagnosis not present

## 2023-05-25 MED ORDER — CYANOCOBALAMIN 1000 MCG/ML IJ SOLN
1000.0000 ug | Freq: Once | INTRAMUSCULAR | Status: AC
Start: 2023-05-25 — End: 2023-05-25
  Administered 2023-05-25: 1000 ug via INTRAMUSCULAR

## 2023-05-25 NOTE — Telephone Encounter (Signed)
Patient called in today and states she is not going to take the the trimethoprim . She started on nitrofurantoin, macrocrystal-monohydrate, (MACROBID) 100 MG capsule that Dr., Milinda Antis give her. She has about a week and a half left.

## 2023-05-25 NOTE — Progress Notes (Signed)
Per orders of Dr. [name], injection of monthly B12 1000 mcg/ml given by Oaklan Persons P Annmarie Plemmons, CMA in Left Deltoid. Patient tolerated injection well.  

## 2023-06-01 MED ORDER — METHENAMINE HIPPURATE 1 G PO TABS
1.0000 g | ORAL_TABLET | Freq: Two times a day (BID) | ORAL | 3 refills | Status: DC
Start: 2023-06-01 — End: 2023-06-15

## 2023-06-01 NOTE — Telephone Encounter (Signed)
Notified patient as instructed,.  

## 2023-06-01 NOTE — Telephone Encounter (Signed)
Pt called back today and said she hasn't heard from anyone and now she only has 2 pills left.  She called on 7/2 and left message.  Pt states Stoioff was going to give her something else if trimethoprim didn't work.  It gave her rapid heartbeat and make her sick on her stomach.

## 2023-06-01 NOTE — Telephone Encounter (Signed)
Rx Hiprex sent to pharmacy

## 2023-06-01 NOTE — Addendum Note (Signed)
Addended by: [name] on: 06/01/2023 01:57 PM   Modules accepted: Orders

## 2023-06-02 ENCOUNTER — Other Ambulatory Visit: Payer: Self-pay | Admitting: Family Medicine

## 2023-06-02 ENCOUNTER — Encounter: Payer: Self-pay | Admitting: Internal Medicine

## 2023-06-02 ENCOUNTER — Other Ambulatory Visit: Payer: Self-pay | Admitting: Physician Assistant

## 2023-06-02 DIAGNOSIS — M25551 Pain in right hip: Secondary | ICD-10-CM | POA: Diagnosis not present

## 2023-06-02 DIAGNOSIS — M059 Rheumatoid arthritis with rheumatoid factor, unspecified: Secondary | ICD-10-CM

## 2023-06-02 MED ORDER — "TUBERCULIN-ALLERGY SYRINGES 27G X 1/2"" 1 ML MISC": 1 refills | Status: DC

## 2023-06-02 NOTE — Telephone Encounter (Signed)
Patient contacted the office requesting a refill of syringes be sent to [facility].   Last Fill: 03/30/2023  Next Visit: 07/01/2023  Last Visit: 03/30/2023  Dx: Seropositive rheumatoid arthritis   Current Dose per office note on 03/30/2023: She had this issue with the syringes last time we will send new prescription requesting 1/2 inch syringe tips which were apparently available for her pharmacy.   Okay to refill Syringes?

## 2023-06-03 MED ORDER — CLORAZEPATE DIPOTASSIUM 7.5 MG PO TABS: 7.5000 mg | ORAL_TABLET | Freq: Every day | ORAL | 0 refills | Status: DC | PRN

## 2023-06-03 NOTE — Telephone Encounter (Signed)
Name of Medication: Clorazepate Name of Pharmacy: [facility]. [address] Last Fill or Written Date and Quantity: 12/15/22 #30 tabs/ 0 refill Last Office Visit and Type: ER f/u on 04/28/23 Next Office Visit and Type: none scheduled

## 2023-06-10 ENCOUNTER — Ambulatory Visit: Payer: Medicare Other | Admitting: Physician Assistant

## 2023-06-15 ENCOUNTER — Other Ambulatory Visit: Payer: Self-pay

## 2023-06-15 ENCOUNTER — Ambulatory Visit (INDEPENDENT_AMBULATORY_CARE_PROVIDER_SITE_OTHER): Payer: Medicare Other | Admitting: Family Medicine

## 2023-06-15 ENCOUNTER — Encounter: Payer: Self-pay | Admitting: Family Medicine

## 2023-06-15 VITALS — BP 141/70 | HR 82 | Temp 97.9°F | Ht 64.0 in | Wt 176.2 lb

## 2023-06-15 DIAGNOSIS — I10 Essential (primary) hypertension: Secondary | ICD-10-CM | POA: Diagnosis not present

## 2023-06-15 DIAGNOSIS — N390 Urinary tract infection, site not specified: Secondary | ICD-10-CM | POA: Diagnosis not present

## 2023-06-15 DIAGNOSIS — R350 Frequency of micturition: Secondary | ICD-10-CM | POA: Diagnosis not present

## 2023-06-15 LAB — POC URINALSYSI DIPSTICK (AUTOMATED)
Bilirubin, UA: NEGATIVE
Blood, UA: NEGATIVE
Glucose, UA: NEGATIVE
Ketones, UA: NEGATIVE
Nitrite, UA: POSITIVE — AB
Protein, UA: NEGATIVE
Spec Grav, UA: 1.015 (ref 1.010–1.025)
Urobilinogen, UA: 0.2 E.U./dL
pH, UA: 6 (ref 5.0–8.0)

## 2023-06-15 MED ORDER — TRIMETHOPRIM 100 MG PO TABS: 100.0000 mg | ORAL_TABLET | Freq: Every day | ORAL | 2 refills | Status: DC

## 2023-06-15 NOTE — Progress Notes (Signed)
Subjective:    Patient ID: Tammy Manning, female    DOB: 11-26-1934, 88 y.o.   MRN: [id number]  HPI  Wt Readings from Last 3 Encounters:  06/15/23 176 lb 4 oz (79.9 kg)  05/19/23 175 lb (79.4 kg)  04/28/23 175 lb (79.4 kg)   30.25 kg/m  Vitals:   06/15/23 1156 06/15/23 1240  BP: (!) 144/64 (!) 141/70  Pulse: 82   Temp: 97.9 F (36.6 C)   SpO2: 98%    Pt presents with uti symptoms  Is not satisfied with current urologist    Positive urinalysis today Results for orders placed or performed in visit on 06/15/23  POCT Urinalysis Dipstick (Automated)  Result Value Ref Range   Color, UA Yellow    Clarity, UA Cloudy    Glucose, UA Negative Negative   Bilirubin, UA Negative    Ketones, UA Negative    Spec Grav, UA 1.015 1.010 - 1.025   Blood, UA Negative    pH, UA 6.0 5.0 - 8.0   Protein, UA Negative Negative   Urobilinogen, UA 0.2 0.2 or 1.0 E.U./dL   Nitrite, UA Positive (A)    Leukocytes, UA Large (3+) (A) Negative    Is on methotrexate for joint disease   Last culture here was multi drug resistant    Saw Dr Lonna Cobb on 6/26 at [facility] in [place]   Disliked his personality/bedside mannor    This was last a/p from that visit  Assessment & Plan:     1. Recurrent UTI Recent worsening of symptoms Doing well on a daily dose of Macrobid. We discussed that there is a black box warning for this medication for patients older than age 51 and the potential association of pulmonary fibrosis, which is not reversible. Recommend discontinuing and will place on Trimethoprim 100 mg daily x3 months. Follow up in 3 months and consider stopping the Trimethoprim and a trial of Methenamine. Return as needed for recurrent UTI symptoms.   She declined the trimethoprim due to fear of side effects Started the new mthanemine and is having side effects - her tongue hurts  Also read side effects   Did not take it this am   Continuing the D mannose   No urinary  symptoms today-they are under control      Lab Results  Component Value Date   NA 139 03/19/2023   K 3.8 03/19/2023   CO2 24 03/19/2023   GLUCOSE 87 03/19/2023   BUN 10 03/19/2023   CREATININE 0.67 03/19/2023   CALCIUM 8.9 03/19/2023   GFR 79.38 03/19/2023   EGFR 76 12/30/2022   GFRNONAA >60 03/14/2023     Patient Active Problem List   Diagnosis Date Noted   Abrasion of left arm 04/28/2023   Head injury 04/28/2023   Superficial injury of skin 04/21/2023   Dysuria 04/12/2023   Diverticulosis 03/21/2023   Acute colitis 03/13/2023   Chronic diastolic CHF (congestive heart failure) (HCC) 03/13/2023   Obesity (BMI 30-39.9) 03/13/2023   PAF (paroxysmal atrial fibrillation) (HCC) 03/13/2023   Recurrent UTI (urinary tract infection) 02/18/2023   Intertrigo 02/16/2023   Hemorrhoids 02/02/2023   Urinary incontinence 02/02/2023   Acute cystitis 01/26/2023   Dilated aortic root (HCC) 01/26/2023   Osteopenia 01/26/2023   Multinodular goiter 02/27/2022   Encounter for screening mammogram for breast cancer 01/23/2022   High risk medication use 10/07/2021   Atrial fibrillation with RVR (HCC) 04/28/2021   Hypertrophic toenail 04/22/2021   Seropositive rheumatoid arthritis (  HCC) 11/25/2020   Bilateral hand pain 11/25/2020   Bilateral shoulder pain 10/16/2020   Joint pain 10/16/2020   Estrogen deficiency 04/30/2020   Dry mouth 03/15/2020   Osteoarthritis of right hip 01/02/2020   Right thyroid nodule 05/12/2018   History of TIA (transient ischemic attack) 05/09/2018   Aortic insufficiency 09/10/2015   Stress reaction 09/08/2015   Encounter for Medicare annual wellness exam 02/28/2013   Bucket-handle tear of lateral meniscus of left knee as current injury 12/30/2012   Episodic atrial fibrillation (HCC) 11/17/2012   Hearing loss of both ears 01/14/2012   B12 deficiency 03/17/2010   GERD (gastroesophageal reflux disease) 03/17/2010   POSTMENOPAUSAL STATUS 08/20/2008   Essential  hypertension 07/19/2007   Hyperlipidemia 06/24/2007   IBS 06/24/2007   DERMATITIS, ATOPIC 06/24/2007   SKIN CANCER, HX OF 06/24/2007   Past Medical History:  Diagnosis Date   A-fib (HCC)    Anginal pain (HCC)    Aortic insufficiency    a. 02/2020 Echo: EF 60-65%, no rwma, Gr1 DD, nl RV fxn, mildly dil LA, triv AI, Asc Ao 39mm; b. 04/2021 Echo: EF 60-65%, no rwma, GrI DD, nl RV fxn, mildly dil LA, AI not visualized. Mild-mod AoV sclerosis w/o stenosis.   Arthritis    Knees   B12 deficiency    Mild   Bucket-handle tear of lateral meniscus of left knee as current injury 12/30/2012   Cancer (HCC)    Skin, basal cell on nose and eyelid   Colitis    DJD (degenerative joint disease)    Low back   Dyspepsia    GERD (gastroesophageal reflux disease)    Heart murmur    Heart palpitations    History of cardiac cath    a. 02/2017 Cath: Nl cors. Nl LV fxn. Abd Aogram w/o RAS.   HOH (hard of hearing)    Hyperlipidemia    Hypertension    IBS (irritable bowel syndrome)    Lactose intolerance    Mixed incontinence    PONV (postoperative nausea and vomiting)    Vulvar irritation    from urine pad, uses Triamcinolone oint.   Past Surgical History:  Procedure Laterality Date   ABDOMINAL AORTOGRAM N/A 03/15/2017   Procedure: Abdominal Aortogram;  Surgeon: Iran Ouch, MD;  Location: [facility];  Service: Cardiovascular;  Laterality: N/A;   ABDOMINAL HYSTERECTOMY  1982   Large fibroids and bleeding   APPENDECTOMY     BILATERAL SALPINGOOPHORECTOMY  1986   BONE GRAFT HIP ILIAC CREST     To Left arm   BREAST REDUCTION SURGERY     BREAST SURGERY     CARDIAC CATHETERIZATION     CATARACT EXTRACTION W/PHACO Left 07/07/2016   Procedure: CATARACT EXTRACTION PHACO AND INTRAOCULAR LENS PLACEMENT (IOC);  Surgeon: Galen Manila, MD;  Location: [facility];  Service: Ophthalmology;  Laterality: Left;  [place] 01:10 AP% 18.3 CDE 12.91 Fluid pak lot # [id number]   CATARACT EXTRACTION W/PHACO  Right 07/28/2016   Procedure: CATARACT EXTRACTION PHACO AND INTRAOCULAR LENS PLACEMENT (IOC);  Surgeon: Galen Manila, MD;  Location: [facility];  Service: Ophthalmology;  Laterality: Right;  [place] 01:26 AP% 18.5 CDE 16.12 Fluid pack lot # [id number]   COLONOSCOPY  07/2016   Dr. Lottie Mussel   COLONOSCOPY WITH PROPOFOL N/A 04/01/2023   Procedure: COLONOSCOPY WITH PROPOFOL;  Surgeon: Toney Reil, MD;  Location: [facility];  Service: Gastroenterology;  Laterality: N/A;   DILATION AND CURETTAGE OF UTERUS     miscarragesx2  ESOPHAGOGASTRODUODENOSCOPY     Polyps   HEMORRHOID SURGERY  2006   KNEE ARTHROSCOPY WITH LATERAL MENISECTOMY Left 12/30/2012   Procedure: KNEE ARTHROSCOPY WITH LATERAL MENISECTOMY;  Surgeon: Eulas Post, MD;  Location: [facility];  Service: Orthopedics;  Laterality: Left;  LEFT KNEE SCOPE LATERAL MENISCECTOMY   LEFT HEART CATH AND CORONARY ANGIOGRAPHY N/A 03/15/2017   Procedure: Left Heart Cath and Coronary Angiography;  Surgeon: Iran Ouch, MD;  Location: [facility];  Service: Cardiovascular;  Laterality: N/A;   MOUTH SURGERY     skin cancer eyelid  2012   TONSILLECTOMY     Social History   Tobacco Use   Smoking status: Former    Current packs/day: 0.00    Average packs/day: 1 pack/day for 12.0 years (12.0 ttl pk-yrs)    Types: Cigarettes    Start date: 11/24/1951    Quit date: 11/24/1963    Years since quitting: 59.5   Smokeless tobacco: Never  Vaping Use   Vaping status: Never Used  Substance Use Topics   Alcohol use: No    Alcohol/week: 0.0 standard drinks of alcohol   Drug use: No   Family History  Problem Relation Age of Onset   Cancer Mother        Colon   Heart disease Father        CAD   Diabetes Sister    Hypothyroidism Sister    Diabetes Brother    Leukemia Brother    Esophageal cancer Brother    Cancer Paternal Aunt        Breast   Diabetes Sister    Alzheimer's disease Sister    Diabetes Brother    Breast  cancer Daughter    Allergies  Allergen Reactions   Antihistamines, Chlorpheniramine-Type Other (See Comments)    Reaction:  Makes pt hyper    Atenolol Hypertension   Cefuroxime Axetil Other (See Comments)    Upset stomach   Ciprofloxacin Other (See Comments)    Upset stomach   Co Q 10 [Coenzyme Q10] Other (See Comments)    Reaction:  GI upset    Crestor [Rosuvastatin Calcium] Other (See Comments)    Reaction:  Myalgias    Dextromethorphan-Guaifenesin Other (See Comments)    Reaction:  Made pt jittery    Epinephrine Hives   Methenamine     Tongue discomfort    Ramipril Other (See Comments)    Upset stomach   Sulfamethoxazole-Trimethoprim Other (See Comments)    Upset stomach   Vitamin D Analogs Other (See Comments)    Reaction:  GI upset    Current Outpatient Medications on File Prior to Visit  Medication Sig Dispense Refill   atorvastatin (LIPITOR) 10 MG tablet TAKE 1 TABLET(10 MG) BY MOUTH DAILY (Patient taking differently: Take 10 mg by mouth daily.) 90 tablet 2   Calcium Carbonate-Vitamin D (CALCIUM 600+D PO) Take 1 tablet by mouth daily.      CARTIA XT 180 MG 24 hr capsule TAKE 1 CAPSULE(180 MG) BY MOUTH DAILY 90 capsule 3   clobetasol ointment (TEMOVATE) 0.05 % Apply 1 application topically 2 (two) times daily as needed.     clorazepate (TRANXENE) 7.5 MG tablet Take 1 tablet (7.5 mg total) by mouth daily as needed. 30 tablet 0   cyanocobalamin (,VITAMIN B-12,) 1000 MCG/ML injection Inject 1,000 mcg into the muscle every 30 (thirty) days.      ELIQUIS 5 MG TABS tablet TAKE 1 TABLET BY MOUTH TWICE DAILY 180  tablet 1   estradiol (ESTRACE) 0.1 MG/GM vaginal cream Apply a pea-sized amount to fingertip and wipe in vaginal introitus twice weekly 42.5 g 1   famotidine (PEPCID) 20 MG tablet Take 20 mg by mouth 2 (two) times daily as needed for heartburn or indigestion.     guaiFENesin (MUCINEX) 600 MG 12 hr tablet Take 600 mg by mouth 2 (two) times daily as needed.      hydrocortisone (ANUSOL-HC) 2.5 % rectal cream Apply 1 Application topically at bedtime. To affected area for 10 days 30 g 0   irbesartan (AVAPRO) 300 MG tablet TAKE 1 TABLET BY MOUTH DAILY (Patient taking differently: Take 300 mg by mouth daily.) 90 tablet 2   loperamide (IMODIUM) 2 MG capsule Take 2 mg by mouth as needed for diarrhea or loose stools.     methotrexate 50 MG/2ML injection Inject 0.6 mLs (15 mg total) into the skin once a week. 4 mL 2   Na Sulfate-K Sulfate-Mg Sulf 17.5-3.13-1.6 GM/177ML SOLN See admin instructions.     triamcinolone (NASACORT) 55 MCG/ACT AERO nasal inhaler Place 2 sprays into the nose daily. 1 each 12   Tuberculin-Allergy Syringes 27G X 1/2" 1 ML MISC Use 1 syringe once weekly to inject methotrexate. 25 each 1   No current facility-administered medications on file prior to visit.    Review of Systems  Constitutional:  Negative for activity change, appetite change, fatigue, fever and unexpected weight change.  HENT:  Negative for congestion, ear pain, rhinorrhea, sinus pressure and sore throat.   Eyes:  Negative for pain, redness and visual disturbance.  Respiratory:  Negative for cough, shortness of breath and wheezing.   Cardiovascular:  Negative for chest pain and palpitations.  Gastrointestinal:  Negative for abdominal pain, blood in stool, constipation and diarrhea.  Endocrine: Negative for polydipsia and polyuria.  Genitourinary:  Positive for frequency. Negative for dysuria and urgency.       No uti symptoms today  Frequency is no more than baseline   Musculoskeletal:  Negative for arthralgias, back pain and myalgias.  Skin:  Negative for pallor and rash.  Allergic/Immunologic: Negative for environmental allergies.  Neurological:  Negative for dizziness, syncope and headaches.  Hematological:  Negative for adenopathy. Does not bruise/bleed easily.  Psychiatric/Behavioral:  Negative for decreased concentration and dysphoric mood. The patient is not  nervous/anxious.        Objective:   Physical Exam Constitutional:      General: She is not in acute distress.    Appearance: Normal appearance. She is well-developed. She is obese. She is not ill-appearing or diaphoretic.  HENT:     Head: Normocephalic and atraumatic.  Eyes:     Conjunctiva/sclera: Conjunctivae normal.     Pupils: Pupils are equal, round, and reactive to light.  Neck:     Thyroid: No thyromegaly.     Vascular: No carotid bruit or JVD.  Cardiovascular:     Rate and Rhythm: Normal rate and regular rhythm.     Heart sounds: Normal heart sounds.     No gallop.  Pulmonary:     Effort: Pulmonary effort is normal. No respiratory distress.     Breath sounds: Normal breath sounds. No wheezing or rales.  Abdominal:     General: There is no distension or abdominal bruit.     Palpations: Abdomen is soft. There is no mass.     Tenderness: There is no abdominal tenderness. There is no right CVA tenderness, left CVA tenderness or guarding.  Hernia: No hernia is present.     Comments: No suprapubic tenderness or fullness    Musculoskeletal:     Cervical back: Normal range of motion and neck supple.     Right lower leg: No edema.     Left lower leg: No edema.  Lymphadenopathy:     Cervical: No cervical adenopathy.  Skin:    General: Skin is warm and dry.     Coloration: Skin is not jaundiced or pale.     Findings: No bruising or rash.  Neurological:     Mental Status: She is alert.     Coordination: Coordination normal.     Deep Tendon Reflexes: Reflexes are normal and symmetric. Reflexes normal.  Psychiatric:        Mood and Affect: Mood normal.           Assessment & Plan:   Problem List Items Addressed This Visit       Cardiovascular and Mediastinum   Essential hypertension    Blood pressure up slightly today with emotional upset  Improved on 2nd check BP: (!) 141/70  Will re check soon  No change in medications         Genitourinary    Recurrent UTI (urinary tract infection)    Ongoing  Reviewed recent urology note Dr Lonna Cobb  Reviewed last culture Has colonization   Options for treatment discussed  Having side effects from methenamine (tongue hurts)  Discussed potential side effect of PF from macrobid She is willing to try trimethoprim and see if she tolerates it   Positive urinalysis today but no symptoms/ suspect colonization   Will update Instructed to call if any signs and symptoms of uti  At her req will ref to urology in [facility]       Relevant Medications   trimethoprim (TRIMPEX) 100 MG tablet   Other Visit Diagnoses     Urinary frequency    -  Primary   Relevant Orders   POCT Urinalysis Dipstick (Automated) (Completed)

## 2023-06-15 NOTE — Patient Instructions (Addendum)
Step the methenamine since you are having side effects   Try the trimethoprim100 mg daily (to prevent symptomatic uti)  If you develop side effects let us know  This medicine may also potentate your methotrexate -be aware   This will be for now while waiting to get in to new urologist  I will put a referral in   [facility] in [place] if possible   In the meantime -if symptoms worsen let us know

## 2023-06-15 NOTE — Assessment & Plan Note (Signed)
Blood pressure up slightly today with emotional upset  Improved on 2nd check BP: (!) 141/70  Will re check soon  No change in medications

## 2023-06-15 NOTE — Assessment & Plan Note (Signed)
Ongoing  Reviewed recent urology note Dr Lonna Cobb  Reviewed last culture Has colonization   Options for treatment discussed  Having side effects from methenamine (tongue hurts)  Discussed potential side effect of PF from macrobid She is willing to try trimethoprim and see if she tolerates it   Positive urinalysis today but no symptoms/ suspect colonization   Will update Instructed to call if any signs and symptoms of uti  At her req will ref to urology in [facility]

## 2023-06-22 ENCOUNTER — Other Ambulatory Visit: Payer: Self-pay | Admitting: Internal Medicine

## 2023-06-22 DIAGNOSIS — M059 Rheumatoid arthritis with rheumatoid factor, unspecified: Secondary | ICD-10-CM

## 2023-06-22 NOTE — Telephone Encounter (Signed)
Last Fill: 03/30/2023  Labs: 03/19/2023  Total Protein 5.5 RBC 3.44 Hemoglobin 11.7 HCT 34.6 MCV 100.7 RDW 17.5 Monocytes Relative 13.1 Monocytes Absolute 1.1  Next Visit: 07/01/2023  Last Visit: 03/30/2023  DX: Seropositive rheumatoid arthritis   Current Dose per office note 03/30/2023: methotrexate 15 mg subcu weekly.   Patient to update labs at upcomming office visit.   Okay to refill Methotrexate?

## 2023-06-23 ENCOUNTER — Ambulatory Visit: Payer: Medicare Other

## 2023-06-23 DIAGNOSIS — M170 Bilateral primary osteoarthritis of knee: Secondary | ICD-10-CM | POA: Diagnosis not present

## 2023-06-24 ENCOUNTER — Ambulatory Visit: Payer: Medicare Other

## 2023-06-24 DIAGNOSIS — E538 Deficiency of other specified B group vitamins: Secondary | ICD-10-CM

## 2023-06-24 MED ORDER — CYANOCOBALAMIN 1000 MCG/ML IJ SOLN
1000.0000 ug | Freq: Once | INTRAMUSCULAR | Status: AC
Start: 2023-06-24 — End: 2023-06-24
  Administered 2023-06-24: 1000 ug via INTRAMUSCULAR

## 2023-06-24 NOTE — Progress Notes (Signed)
Per orders of Dr. Roxy Manns, injection of B-12 given by Nanci Pina in right deltoid. Patient tolerated injection well.

## 2023-06-24 NOTE — Progress Notes (Deleted)
Office Visit Note  Patient: Tammy Manning             Date of Birth: 07/12/1935           MRN: [id number]             PCP: [name], MD Referring: [name], Audrie Gallus, MD Visit Date: 07/01/2023   Subjective:  No chief complaint on file.   History of Present Illness: Tammy Manning is a 88 y.o. female here for follow up for seropositive RA on methotrexate 15 mg subcu weekly. Not on folic acid due to intolerance of oral supplement.    Previous HPI 03/30/2023 Tammy Manning is a 88 y.o. female here for follow up for seropositive RA on methotrexate 15 mg subcu weekly.  Not on folic acid due to intolerance of oral supplement.  Overall arthritis symptoms are doing well.  She had recent bilateral knee steroid injections with Dr. Dion Saucier with good improvement.  Currently no active complaint about the neck shoulder or hand pain.  She had an episode of colitis no specific causative infection or inflammatory process identified.  This has resolved but is now scheduled for colonoscopy on Thursday.     Previous HPI 12/30/22 Tammy Manning is a 88 y.o. female here for follow up for seropositive RA on methotrexate 15 mg subcu weekly and folic acid 1 mg daily.  Since her last visit she still had some ongoing pain most persistently in the neck and shoulders bilaterally but without any severe flareup compared to before starting treatment.  She has been experiencing more pain affecting her left knee where there is known severe osteoarthritis.  She started taking a lot of Tylenol about 3 times every day 3 to 4 weeks ago but started to develop right-sided flank pain and diarrhea with yellow discolored stools.  She decreased to taking Tylenol no more than once daily with improvement of the symptoms.  She saw Dr. Dion Saucier with intra-articular steroid injection 2 weeks ago with a good improvement in symptoms.  She has not had any significant interval infections.     Previous HPI 03/09/22 Tammy Manning is  a 88 y.o. female here for follow up for seropositive RA on MTX 15 mg [place] weekly. She is doing very well since last visit joint pains almost all better except bilateral hips bothering her intermittently. Left knee also creaky but not much inflammation problem. She has persistent stomach irritation, not much relief with taking pepcid as needed. [facility] not able [facility] methotrexate Rx from supply issue but she still had medication on hand. She saw urology recently with persistent abnormal UA and cultures but mostly asymptomatic.   Previous HPI 12/29/21 Tammy Manning is a 88 y.o. female here for follow up for RA after restarting methotrexate at 15 mg [place] weekly, which had been delayed due to repeated interruptions from UTI infections and antibiotic treatment. She has noticed improvement in joint symptoms after about 2 weeks taking the methotrexate. She continues to have some nausea when she takes this despite subcutaneous route.   Previous HPI 11/25/20 Tammy Manning is a 88 y.o. female with a history of afib, TIA, and OA here for evaluation of joint pain and positive rheumatoid factor. She has joint pain in multiple sites including both hands but also has a lot of pain with the right shoulder that started more acutely with suspected rotator cuff tear. The shoulder and hand pain are problematic and impede painting which is a  regular activity for her. She does notice swelling in hands intermittently. She has morning stiffness lasting less than 30 minutes daily also persistent pain throughout the day. She has had left radius fracture with surgical repair but no other major surgery of her arms.   No Rheumatology ROS completed.   PMFS History:  Patient Active Problem List   Diagnosis Date Noted   Abrasion of left arm 04/28/2023   Head injury 04/28/2023   Superficial injury of skin 04/21/2023   Dysuria 04/12/2023   Diverticulosis 03/21/2023   Acute colitis 03/13/2023    Chronic diastolic CHF (congestive heart failure) (HCC) 03/13/2023   Obesity (BMI 30-39.9) 03/13/2023   PAF (paroxysmal atrial fibrillation) (HCC) 03/13/2023   Recurrent UTI (urinary tract infection) 02/18/2023   Intertrigo 02/16/2023   Hemorrhoids 02/02/2023   Urinary incontinence 02/02/2023   Acute cystitis 01/26/2023   Dilated aortic root (HCC) 01/26/2023   Osteopenia 01/26/2023   Multinodular goiter 02/27/2022   Encounter for screening mammogram for breast cancer 01/23/2022   High risk medication use 10/07/2021   Atrial fibrillation with RVR (HCC) 04/28/2021   Hypertrophic toenail 04/22/2021   Seropositive rheumatoid arthritis (HCC) 11/25/2020   Bilateral hand pain 11/25/2020   Bilateral shoulder pain 10/16/2020   Joint pain 10/16/2020   Estrogen deficiency 04/30/2020   Dry mouth 03/15/2020   Osteoarthritis of right hip 01/02/2020   Right thyroid nodule 05/12/2018   History of TIA (transient ischemic attack) 05/09/2018   Aortic insufficiency 09/10/2015   Stress reaction 09/08/2015   Encounter for Medicare annual wellness exam 02/28/2013   Bucket-handle tear of lateral meniscus of left knee as current injury 12/30/2012   Episodic atrial fibrillation (HCC) 11/17/2012   Hearing loss of both ears 01/14/2012   B12 deficiency 03/17/2010   GERD (gastroesophageal reflux disease) 03/17/2010   POSTMENOPAUSAL STATUS 08/20/2008   Essential hypertension 07/19/2007   Hyperlipidemia 06/24/2007   IBS 06/24/2007   DERMATITIS, ATOPIC 06/24/2007   SKIN CANCER, HX OF 06/24/2007    Past Medical History:  Diagnosis Date   A-fib (HCC)    Anginal pain (HCC)    Aortic insufficiency    a. 02/2020 Echo: EF 60-65%, no rwma, Gr1 DD, nl RV fxn, mildly dil LA, triv AI, Asc Ao 39mm; b. 04/2021 Echo: EF 60-65%, no rwma, GrI DD, nl RV fxn, mildly dil LA, AI not visualized. Mild-mod AoV sclerosis w/o stenosis.   Arthritis    Knees   B12 deficiency    Mild   Bucket-handle tear of lateral meniscus of  left knee as current injury 12/30/2012   Cancer (HCC)    Skin, basal cell on nose and eyelid   Colitis    DJD (degenerative joint disease)    Low back   Dyspepsia    GERD (gastroesophageal reflux disease)    Heart murmur    Heart palpitations    History of cardiac cath    a. 02/2017 Cath: Nl cors. Nl LV fxn. Abd Aogram w/o RAS.   HOH (hard of hearing)    Hyperlipidemia    Hypertension    IBS (irritable bowel syndrome)    Lactose intolerance    Mixed incontinence    PONV (postoperative nausea and vomiting)    Vulvar irritation    from urine pad, uses Triamcinolone oint.    Family History  Problem Relation Age of Onset   Cancer Mother        Colon   Heart disease Father        CAD  Diabetes Sister    Hypothyroidism Sister    Diabetes Brother    Leukemia Brother    Esophageal cancer Brother    Cancer Paternal Aunt        Breast   Diabetes Sister    Alzheimer's disease Sister    Diabetes Brother    Breast cancer Daughter    Past Surgical History:  Procedure Laterality Date   ABDOMINAL AORTOGRAM N/A 03/15/2017   Procedure: Abdominal Aortogram;  Surgeon: Iran Ouch, MD;  Location: [facility];  Service: Cardiovascular;  Laterality: N/A;   ABDOMINAL HYSTERECTOMY  1982   Large fibroids and bleeding   APPENDECTOMY     BILATERAL SALPINGOOPHORECTOMY  1986   BONE GRAFT HIP ILIAC CREST     To Left arm   BREAST REDUCTION SURGERY     BREAST SURGERY     CARDIAC CATHETERIZATION     CATARACT EXTRACTION W/PHACO Left 07/07/2016   Procedure: CATARACT EXTRACTION PHACO AND INTRAOCULAR LENS PLACEMENT (IOC);  Surgeon: Galen Manila, MD;  Location: [facility];  Service: Ophthalmology;  Laterality: Left;  [place] 01:10 AP% 18.3 CDE 12.91 Fluid pak lot # [id number]   CATARACT EXTRACTION W/PHACO Right 07/28/2016   Procedure: CATARACT EXTRACTION PHACO AND INTRAOCULAR LENS PLACEMENT (IOC);  Surgeon: Galen Manila, MD;  Location: [facility];  Service: Ophthalmology;  Laterality:  Right;  [place] 01:26 AP% 18.5 CDE 16.12 Fluid pack lot # [id number]   COLONOSCOPY  07/2016   Dr. Lottie Mussel   COLONOSCOPY WITH PROPOFOL N/A 04/01/2023   Procedure: COLONOSCOPY WITH PROPOFOL;  Surgeon: Toney Reil, MD;  Location: [facility];  Service: Gastroenterology;  Laterality: N/A;   DILATION AND CURETTAGE OF UTERUS     miscarragesx2   ESOPHAGOGASTRODUODENOSCOPY     Polyps   HEMORRHOID SURGERY  2006   KNEE ARTHROSCOPY WITH LATERAL MENISECTOMY Left 12/30/2012   Procedure: KNEE ARTHROSCOPY WITH LATERAL MENISECTOMY;  Surgeon: Eulas Post, MD;  Location: [facility];  Service: Orthopedics;  Laterality: Left;  LEFT KNEE SCOPE LATERAL MENISCECTOMY   LEFT HEART CATH AND CORONARY ANGIOGRAPHY N/A 03/15/2017   Procedure: Left Heart Cath and Coronary Angiography;  Surgeon: Iran Ouch, MD;  Location: [facility];  Service: Cardiovascular;  Laterality: N/A;   MOUTH SURGERY     skin cancer eyelid  2012   TONSILLECTOMY     Social History   Social History Narrative   Wellsite geologist.     Immunization History  Administered Date(s) Administered   Fluad Quad(high Dose 65+) 10/16/2020, 07/24/2022   Influenza Split 07/24/2011, 08/23/2014   Influenza Whole 08/23/2004, 07/24/2009, 08/06/2010, 08/05/2012   Influenza, High Dose Seasonal PF 08/11/2016, 08/27/2017, 08/02/2018, 09/05/2021   Influenza,inj,Quad PF,6+ Mos 08/16/2013, 08/07/2015   Influenza-Unspecified 09/05/2021   PFIZER(Purple Top)SARS-COV-2 Vaccination 12/15/2019, 01/05/2020, 10/25/2020   Pneumococcal Conjugate-13 08/24/2014   Pneumococcal Polysaccharide-23 08/20/2008   Respiratory Syncytial Virus Vaccine,Recomb Aduvanted(Arexvy) 12/30/2022   Td 03/20/2003, 02/28/2013, 04/21/2023   Zoster, Live 09/04/2015     Objective: Vital Signs: There were no vitals taken for this visit.   Physical Exam   Musculoskeletal Exam: ***  CDAI Exam: CDAI Score: -- Patient Global: --; Provider Global: -- Swollen: --;  Tender: -- Joint Exam 07/01/2023   No joint exam has been documented for this visit   There is currently no information documented on the homunculus. Go to the Rheumatology activity and complete the homunculus joint exam.  Investigation: No additional findings.  Imaging: No results found.  Recent Labs: Lab Results  Component Value Date   WBC 8.3 03/19/2023   HGB 11.7 (L) 03/19/2023   PLT 385.0 03/19/2023   NA 139 03/19/2023   K 3.8 03/19/2023   CL 105 03/19/2023   CO2 24 03/19/2023   GLUCOSE 87 03/19/2023   BUN 10 03/19/2023   CREATININE 0.67 03/19/2023   BILITOT 0.4 03/19/2023   ALKPHOS 43 03/19/2023   AST 20 03/19/2023   ALT 20 03/19/2023   PROT 5.5 (L) 03/19/2023   ALBUMIN 3.5 03/19/2023   CALCIUM 8.9 03/19/2023   GFRAA 71 02/02/2020    Speciality Comments: No specialty comments available.  Procedures:  No procedures performed Allergies: Antihistamines, chlorpheniramine-type; Atenolol; Cefuroxime axetil; Ciprofloxacin; Co q 10 [coenzyme q10]; Crestor [rosuvastatin calcium]; Dextromethorphan-guaifenesin; Epinephrine; Methenamine; Ramipril; Sulfamethoxazole-trimethoprim; and Vitamin d analogs   Assessment / Plan:     Visit Diagnoses: No diagnosis found.  ***  Orders: No orders of the defined types were placed in this encounter.  No orders of the defined types were placed in this encounter.    Follow-Up Instructions: No follow-ups on file.   Metta Clines, RT  Note - This record has been created using [employer].  Chart creation errors have been sought, but may not always  have been located. Such creation errors do not reflect on  the standard of medical care.

## 2023-06-30 ENCOUNTER — Encounter: Payer: Self-pay | Admitting: Family Medicine

## 2023-06-30 NOTE — Telephone Encounter (Signed)
Will route to referral Dpt because last OV note says pt wants a urologist in [place] and referral was for urologist in [place] but letter says referral was sent to [facility] urological, please review

## 2023-07-01 ENCOUNTER — Ambulatory Visit: Payer: Medicare Other | Admitting: Internal Medicine

## 2023-07-01 DIAGNOSIS — K529 Noninfective gastroenteritis and colitis, unspecified: Secondary | ICD-10-CM

## 2023-07-01 DIAGNOSIS — M059 Rheumatoid arthritis with rheumatoid factor, unspecified: Secondary | ICD-10-CM

## 2023-07-01 DIAGNOSIS — Z79899 Other long term (current) drug therapy: Secondary | ICD-10-CM

## 2023-07-01 NOTE — Telephone Encounter (Signed)
Referral has been Routed to [facility] Urology per patient preference

## 2023-07-05 NOTE — Progress Notes (Unsigned)
Office Visit Note  Patient: Tammy Manning             Date of Birth: May 29, 1935           MRN: [id number]             PCP: [name], MD Referring: [name], Audrie Gallus, MD Visit Date: 07/07/2023   Subjective:  No chief complaint on file.   History of Present Illness: Tammy Manning is a 88 y.o. female here for follow up for seropositive RA on methotrexate 15 mg subcu weekly.    Previous HPI 03/30/2023 Tammy Manning is a 88 y.o. female here for follow up for seropositive RA on methotrexate 15 mg subcu weekly.  Not on folic acid due to intolerance of oral supplement.  Overall arthritis symptoms are doing well.  She had recent bilateral knee steroid injections with Dr. Dion Saucier with good improvement.  Currently no active complaint about the neck shoulder or hand pain.  She had an episode of colitis no specific causative infection or inflammatory process identified.  This has resolved but is now scheduled for colonoscopy on Thursday.     Previous HPI 12/30/22 Tammy Manning is a 88 y.o. female here for follow up for seropositive RA on methotrexate 15 mg subcu weekly and folic acid 1 mg daily.  Since her last visit she still had some ongoing pain most persistently in the neck and shoulders bilaterally but without any severe flareup compared to before starting treatment.  She has been experiencing more pain affecting her left knee where there is known severe osteoarthritis.  She started taking a lot of Tylenol about 3 times every day 3 to 4 weeks ago but started to develop right-sided flank pain and diarrhea with yellow discolored stools.  She decreased to taking Tylenol no more than once daily with improvement of the symptoms.  She saw Dr. Dion Saucier with intra-articular steroid injection 2 weeks ago with a good improvement in symptoms.  She has not had any significant interval infections.     Previous HPI 03/09/22 Tammy Manning is a 88 y.o. female here for follow up for seropositive RA  on MTX 15 mg [place] weekly. She is doing very well since last visit joint pains almost all better except bilateral hips bothering her intermittently. Left knee also creaky but not much inflammation problem. She has persistent stomach irritation, not much relief with taking pepcid as needed. [facility] not able to [facility] methotrexate Rx from supply issue but she still had medication on hand. She saw urology recently with persistent abnormal UA and cultures but mostly asymptomatic.   Previous HPI 12/29/21 Tammy Manning is a 88 y.o. female here for follow up for RA after restarting methotrexate at 15 mg [place] weekly, which had been delayed due to repeated interruptions from UTI infections and antibiotic treatment. She has noticed improvement in joint symptoms after about 2 weeks taking the methotrexate. She continues to have some nausea when she takes this despite subcutaneous route.   Previous HPI 11/25/20 Tammy Manning is a 88 y.o. female with a history of afib, TIA, and OA here for evaluation of joint pain and positive rheumatoid factor. She has joint pain in multiple sites including both hands but also has a lot of pain with the right shoulder that started more acutely with suspected rotator cuff tear. 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 She does notice swelling in hands  intermittently. She has morning stiffness lasting less than 30 minutes daily also persistent pain throughout the day. She has had left radius fracture with surgical repair but no other major surgery of her arms.   No Rheumatology ROS completed.   PMFS History:  Patient Active Problem List   Diagnosis Date Noted   Abrasion of left arm 04/28/2023   Head injury 04/28/2023   Superficial injury of skin 04/21/2023   Dysuria 04/12/2023   Diverticulosis 03/21/2023   Acute colitis 03/13/2023   Chronic diastolic CHF (congestive heart failure) (HCC)  03/13/2023   Obesity (BMI 30-39.9) 03/13/2023   PAF (paroxysmal atrial fibrillation) (HCC) 03/13/2023   Recurrent UTI (urinary tract infection) 02/18/2023   Intertrigo 02/16/2023   Hemorrhoids 02/02/2023   Urinary incontinence 02/02/2023   Acute cystitis 01/26/2023   Dilated aortic root (HCC) 01/26/2023   Osteopenia 01/26/2023   Multinodular goiter 02/27/2022   Encounter for screening mammogram for breast cancer 01/23/2022   High risk medication use 10/07/2021   Atrial fibrillation with RVR (HCC) 04/28/2021   Hypertrophic toenail 04/22/2021   Seropositive rheumatoid arthritis (HCC) 11/25/2020   Bilateral hand pain 11/25/2020   Bilateral shoulder pain 10/16/2020   Joint pain 10/16/2020   Estrogen deficiency 04/30/2020   Dry mouth 03/15/2020   Osteoarthritis of right hip 01/02/2020   Right thyroid nodule 05/12/2018   History of TIA (transient ischemic attack) 05/09/2018   Aortic insufficiency 09/10/2015   Stress reaction 09/08/2015   Encounter for Medicare annual wellness exam 02/28/2013   Bucket-handle tear of lateral meniscus of left knee as current injury 12/30/2012   Episodic atrial fibrillation (HCC) 11/17/2012   Hearing loss of both ears 01/14/2012   B12 deficiency 03/17/2010   GERD (gastroesophageal reflux disease) 03/17/2010   POSTMENOPAUSAL STATUS 08/20/2008   Essential hypertension 07/19/2007   Hyperlipidemia 06/24/2007   IBS 06/24/2007   DERMATITIS, ATOPIC 06/24/2007   SKIN CANCER, HX OF 06/24/2007    Past Medical History:  Diagnosis Date   A-fib (HCC)    Anginal pain (HCC)    Aortic insufficiency    a. 02/2020 Echo: EF 60-65%, no rwma, Gr1 DD, nl RV fxn, mildly dil LA, triv AI, Asc Ao 39mm; b. 04/2021 Echo: EF 60-65%, no rwma, GrI DD, nl RV fxn, mildly dil LA, AI not visualized. Mild-mod AoV sclerosis w/o stenosis.   Arthritis    Knees   B12 deficiency    Mild   Bucket-handle tear of lateral meniscus of left knee as current injury 12/30/2012   Cancer (HCC)     Skin, basal cell on nose and eyelid   Colitis    DJD (degenerative joint disease)    Low back   Dyspepsia    GERD (gastroesophageal reflux disease)    Heart murmur    Heart palpitations    History of cardiac cath    a. 02/2017 Cath: Nl cors. Nl LV fxn. Abd Aogram w/o RAS.   HOH (hard of hearing)    Hyperlipidemia    Hypertension    IBS (irritable bowel syndrome)    Lactose intolerance    Mixed incontinence    PONV (postoperative nausea and vomiting)    Vulvar irritation    from urine pad, uses Triamcinolone oint.    Family History  Problem Relation Age of Onset   Cancer Mother        Colon   Heart disease Father        CAD   Diabetes Sister    Hypothyroidism Sister  Diabetes Brother    Leukemia Brother    Esophageal cancer Brother    Cancer Paternal Aunt        Breast   Diabetes Sister    Alzheimer's disease Sister    Diabetes Brother    Breast cancer Daughter    Past Surgical History:  Procedure Laterality Date   ABDOMINAL AORTOGRAM N/A 03/15/2017   Procedure: Abdominal Aortogram;  Surgeon: Iran Ouch, MD;  Location: [facility];  Service: Cardiovascular;  Laterality: N/A;   ABDOMINAL HYSTERECTOMY  1982   Large fibroids and bleeding   APPENDECTOMY     BILATERAL SALPINGOOPHORECTOMY  1986   BONE GRAFT HIP ILIAC CREST     To Left arm   BREAST REDUCTION SURGERY     BREAST SURGERY     CARDIAC CATHETERIZATION     CATARACT EXTRACTION W/PHACO Left 07/07/2016   Procedure: CATARACT EXTRACTION PHACO AND INTRAOCULAR LENS PLACEMENT (IOC);  Surgeon: Galen Manila, MD;  Location: [facility];  Service: Ophthalmology;  Laterality: Left;  [place] 01:10 AP% 18.3 CDE 12.91 Fluid pak lot # [id number]   CATARACT EXTRACTION W/PHACO Right 07/28/2016   Procedure: CATARACT EXTRACTION PHACO AND INTRAOCULAR LENS PLACEMENT (IOC);  Surgeon: Galen Manila, MD;  Location: [facility];  Service: Ophthalmology;  Laterality: Right;  [place] 01:26 AP% 18.5 CDE 16.12 Fluid pack lot #  [id number]   COLONOSCOPY  07/2016   Dr. Lottie Mussel   COLONOSCOPY WITH PROPOFOL N/A 04/01/2023   Procedure: COLONOSCOPY WITH PROPOFOL;  Surgeon: Toney Reil, MD;  Location: [facility];  Service: Gastroenterology;  Laterality: N/A;   DILATION AND CURETTAGE OF UTERUS     miscarragesx2   ESOPHAGOGASTRODUODENOSCOPY     Polyps   HEMORRHOID SURGERY  2006   KNEE ARTHROSCOPY WITH LATERAL MENISECTOMY Left 12/30/2012   Procedure: KNEE ARTHROSCOPY WITH LATERAL MENISECTOMY;  Surgeon: Eulas Post, MD;  Location: [facility];  Service: Orthopedics;  Laterality: Left;  LEFT KNEE SCOPE LATERAL MENISCECTOMY   LEFT HEART CATH AND CORONARY ANGIOGRAPHY N/A 03/15/2017   Procedure: Left Heart Cath and Coronary Angiography;  Surgeon: Iran Ouch, MD;  Location: [facility];  Service: Cardiovascular;  Laterality: N/A;   MOUTH SURGERY     skin cancer eyelid  2012   TONSILLECTOMY     Social History   Social History Narrative   Wellsite geologist.     Immunization History  Administered Date(s) Administered   Fluad Quad(high Dose 65+) 10/16/2020, 07/24/2022   Influenza Split 07/24/2011, 08/23/2014   Influenza Whole 08/23/2004, 07/24/2009, 08/06/2010, 08/05/2012   Influenza, High Dose Seasonal PF 08/11/2016, 08/27/2017, 08/02/2018, 09/05/2021   Influenza,inj,Quad PF,6+ Mos 08/16/2013, 08/07/2015   Influenza-Unspecified 09/05/2021   PFIZER(Purple Top)SARS-COV-2 Vaccination 12/15/2019, 01/05/2020, 10/25/2020   Pneumococcal Conjugate-13 08/24/2014   Pneumococcal Polysaccharide-23 08/20/2008   Respiratory Syncytial Virus Vaccine,Recomb Aduvanted(Arexvy) 12/30/2022   Td 03/20/2003, 02/28/2013, 04/21/2023   Zoster, Live 09/04/2015     Objective: Vital Signs: There were no vitals taken for this visit.   Physical Exam   Musculoskeletal Exam: ***  CDAI Exam: CDAI Score: -- Patient Global: --; Provider Global: -- Swollen: --; Tender: -- Joint Exam 07/07/2023   No joint exam has  been documented for this visit   There is currently no information documented on the homunculus. Go to the Rheumatology activity and complete the homunculus joint exam.  Investigation: No additional findings.  Imaging: No results found.  Recent Labs: Lab Results  Component Value Date   WBC 8.3 03/19/2023  HGB 11.7 (L) 03/19/2023   PLT 385.0 03/19/2023   NA 139 03/19/2023   K 3.8 03/19/2023   CL 105 03/19/2023   CO2 24 03/19/2023   GLUCOSE 87 03/19/2023   BUN 10 03/19/2023   CREATININE 0.67 03/19/2023   BILITOT 0.4 03/19/2023   ALKPHOS 43 03/19/2023   AST 20 03/19/2023   ALT 20 03/19/2023   PROT 5.5 (L) 03/19/2023   ALBUMIN 3.5 03/19/2023   CALCIUM 8.9 03/19/2023   GFRAA 71 02/02/2020    Speciality Comments: No specialty comments available.  Procedures:  No procedures performed Allergies: Antihistamines, chlorpheniramine-type; Atenolol; Cefuroxime axetil; Ciprofloxacin; Co q 10 [coenzyme q10]; Crestor [rosuvastatin calcium]; Dextromethorphan-guaifenesin; Epinephrine; Methenamine; Ramipril; Sulfamethoxazole-trimethoprim; and Vitamin d analogs   Assessment / Plan:     Visit Diagnoses: No diagnosis found.  ***  Orders: No orders of the defined types were placed in this encounter.  No orders of the defined types were placed in this encounter.    Follow-Up Instructions: No follow-ups on file.   Metta Clines, RT  Note - This record has been created using [employer].  Chart creation errors have been sought, but may not always  have been located. Such creation errors do not reflect on  the standard of medical care.

## 2023-07-06 ENCOUNTER — Ambulatory Visit: Payer: Medicare Other

## 2023-07-07 ENCOUNTER — Encounter: Payer: Self-pay | Admitting: Internal Medicine

## 2023-07-07 ENCOUNTER — Ambulatory Visit: Payer: Medicare Other | Attending: Internal Medicine | Admitting: Internal Medicine

## 2023-07-07 VITALS — BP 113/65 | HR 84 | Resp 17 | Ht 64.5 in | Wt 174.0 lb

## 2023-07-07 DIAGNOSIS — Z79899 Other long term (current) drug therapy: Secondary | ICD-10-CM | POA: Insufficient documentation

## 2023-07-07 DIAGNOSIS — M059 Rheumatoid arthritis with rheumatoid factor, unspecified: Secondary | ICD-10-CM | POA: Insufficient documentation

## 2023-07-07 DIAGNOSIS — K529 Noninfective gastroenteritis and colitis, unspecified: Secondary | ICD-10-CM | POA: Diagnosis not present

## 2023-07-07 DIAGNOSIS — R300 Dysuria: Secondary | ICD-10-CM | POA: Insufficient documentation

## 2023-07-08 ENCOUNTER — Encounter (INDEPENDENT_AMBULATORY_CARE_PROVIDER_SITE_OTHER): Payer: Self-pay

## 2023-07-08 LAB — CBC WITH DIFFERENTIAL/PLATELET
Absolute Monocytes: 1252 {cells}/uL — ABNORMAL HIGH (ref 200–950)
Basophils Absolute: 10 {cells}/uL (ref 0–200)
Basophils Relative: 0.1 %
Eosinophils Absolute: 30 {cells}/uL (ref 15–500)
Eosinophils Relative: 0.3 %
HCT: 34.6 % — ABNORMAL LOW (ref 35.0–45.0)
Hemoglobin: 11.7 g/dL (ref 11.7–15.5)
Lymphs Abs: 1081 {cells}/uL (ref 850–3900)
MCH: 33.9 pg — ABNORMAL HIGH (ref 27.0–33.0)
MCHC: 33.8 g/dL (ref 32.0–36.0)
MCV: 100.3 fL — ABNORMAL HIGH (ref 80.0–100.0)
MPV: 9.2 fL (ref 7.5–12.5)
Monocytes Relative: 12.4 %
Neutro Abs: 7727 {cells}/uL (ref 1500–7800)
Neutrophils Relative %: 76.5 %
Platelets: 282 10*3/uL (ref 140–400)
RBC: 3.45 10*6/uL — ABNORMAL LOW (ref 3.80–5.10)
RDW: 14.5 % (ref 11.0–15.0)
Total Lymphocyte: 10.7 %
WBC: 10.1 10*3/uL (ref 3.8–10.8)

## 2023-07-08 LAB — COMPLETE METABOLIC PANEL WITH GFR
AG Ratio: 2.2 (calc) (ref 1.0–2.5)
ALT: 16 U/L (ref 6–29)
AST: 14 U/L (ref 10–35)
Albumin: 3.9 g/dL (ref 3.6–5.1)
Alkaline phosphatase (APISO): 42 U/L (ref 37–153)
BUN/Creatinine Ratio: 23 (calc) — ABNORMAL HIGH (ref 6–22)
BUN: 23 mg/dL (ref 7–25)
CO2: 19 mmol/L — ABNORMAL LOW (ref 20–32)
Calcium: 9.5 mg/dL (ref 8.6–10.4)
Chloride: 102 mmol/L (ref 98–110)
Creat: 0.99 mg/dL — ABNORMAL HIGH (ref 0.60–0.95)
Globulin: 1.8 g/dL — ABNORMAL LOW (ref 1.9–3.7)
Glucose, Bld: 82 mg/dL (ref 65–99)
Potassium: 4.6 mmol/L (ref 3.5–5.3)
Sodium: 133 mmol/L — ABNORMAL LOW (ref 135–146)
Total Bilirubin: 0.7 mg/dL (ref 0.2–1.2)
Total Protein: 5.7 g/dL — ABNORMAL LOW (ref 6.1–8.1)
eGFR: 56 mL/min/{1.73_m2} — ABNORMAL LOW (ref >=60)

## 2023-07-08 LAB — SEDIMENTATION RATE: Sed Rate: 6 mm/h (ref 0–30)

## 2023-07-20 ENCOUNTER — Telehealth: Payer: Self-pay | Admitting: Cardiovascular Disease

## 2023-07-20 NOTE — Telephone Encounter (Signed)
  Pt c/o medication issue:  1. Name of Medication: ELIQUIS 5 MG TABS tablet   2. How are you currently taking this medication (dosage and times per day)?   TAKE 1 TABLET BY MOUTH TWICE DAILY    3. Are you having a reaction (difficulty breathing--STAT)? No   4. What is your medication issue? The patient mentioned that she currently has a lot of bruising all over her body. While she has not experienced any bleeding, she bruises easily and is concerned that this might be related to her use of Eliquis

## 2023-07-20 NOTE — Telephone Encounter (Signed)
Contacted patient, she states that she is having some bruising spots come up all over her body in different spots. Patient states she is not bleeding, she does not even remember hitting herself to cause these little spots, however I did advise patient that with the Eliquis being a blood thinner she can have issues with bruising a lot easier, as well as her skin being thinner. Patient did verbalize understanding, she states nothing is wrong she just wanted to make [place] aware. Patient aware to contact us if she has any bleeding issues or concerns, or if anything changes. She was questioning if we could cut the dosage, instructed patient this is dosed based on different factors and per last note the 5 mg was the correct dosage. Patient verbalized understanding, thankful for call back.

## 2023-07-21 ENCOUNTER — Encounter: Payer: Self-pay | Admitting: Family Medicine

## 2023-07-21 ENCOUNTER — Ambulatory Visit (INDEPENDENT_AMBULATORY_CARE_PROVIDER_SITE_OTHER): Admission: RE | Admit: 2023-07-21 | Payer: Medicare Other | Source: Ambulatory Visit

## 2023-07-21 ENCOUNTER — Ambulatory Visit (INDEPENDENT_AMBULATORY_CARE_PROVIDER_SITE_OTHER): Payer: Medicare Other | Admitting: Family Medicine

## 2023-07-21 VITALS — BP 131/60 | HR 90 | Temp 98.0°F | Ht 64.5 in | Wt 176.1 lb

## 2023-07-21 DIAGNOSIS — J069 Acute upper respiratory infection, unspecified: Secondary | ICD-10-CM

## 2023-07-21 DIAGNOSIS — R059 Cough, unspecified: Secondary | ICD-10-CM | POA: Diagnosis not present

## 2023-07-21 DIAGNOSIS — I700 Atherosclerosis of aorta: Secondary | ICD-10-CM | POA: Diagnosis not present

## 2023-07-21 MED ORDER — PREDNISONE 20 MG PO TABS
20.0000 mg | ORAL_TABLET | Freq: Every day | ORAL | 0 refills | Status: DC
Start: 2023-07-21 — End: 2023-08-02

## 2023-07-21 MED ORDER — BENZONATATE 200 MG PO CAPS: 200.0000 mg | ORAL_CAPSULE | Freq: Three times a day (TID) | ORAL | 1 refills | Status: DC | PRN

## 2023-07-21 MED ORDER — HYDROCOD POLI-CHLORPHE POLI ER 10-8 MG/5ML PO SUER: 5.0000 mL | Freq: Every evening | ORAL | 0 refills | Status: DC | PRN

## 2023-07-21 NOTE — Progress Notes (Signed)
Subjective:    Patient ID: Tammy Manning, female    DOB: Sep 06, 1935, 88 y.o.   MRN: [id number]  HPI  Wt Readings from Last 3 Encounters:  07/21/23 176 lb 2 oz (79.9 kg)  07/07/23 174 lb (78.9 kg)  06/15/23 176 lb 4 oz (79.9 kg)   29.76 kg/m  Vitals:   07/21/23 1544 07/21/23 1603  BP: (!) 142/60 131/60  Pulse: 90   Temp: 98 F (36.7 C)   SpO2: 97%    Pt presents for 3 weeks of cough    Has not done a covid test   This started with a big whiff of air /air conditioner/ too cold  Some congestion and runny nose at first  Sore throat  Ears feel cogged   No fever  No body aches until 2nd week  Headache-frontal  Facial pain and pressure   Cough is not productive now (was prior) - mucous was yellow and thick  No wheezing  Some shortness of breath with exertion   Now more of a dry cough   Over the counter  Mucinex   Cxr today DG Chest 2 View  Result Date: 07/21/2023 CLINICAL DATA:  Cough for over 3 weeks. EXAM: CHEST - 2 VIEW COMPARISON:  06/08/2021 FINDINGS: Both lungs are clear. Heart and mediastinum are within normal limits. Atherosclerotic calcifications at the aortic arch. Trachea is midline. No large pleural effusions. No acute bone abnormality. IMPRESSION: No active cardiopulmonary disease. Electronically Signed   By: Richarda Overlie M.D.   On: 07/21/2023 16:23      Patient Active Problem List   Diagnosis Date Noted   Viral URI with cough 07/21/2023   Abrasion of left arm 04/28/2023   Head injury 04/28/2023   Superficial injury of skin 04/21/2023   Dysuria 04/12/2023   Diverticulosis 03/21/2023   Acute colitis 03/13/2023   Chronic diastolic CHF (congestive heart failure) (HCC) 03/13/2023   Obesity (BMI 30-39.9) 03/13/2023   PAF (paroxysmal atrial fibrillation) (HCC) 03/13/2023   Recurrent UTI (urinary tract infection) 02/18/2023   Intertrigo 02/16/2023   Hemorrhoids 02/02/2023   Urinary incontinence 02/02/2023   Acute cystitis 01/26/2023   Dilated  aortic root (HCC) 01/26/2023   Osteopenia 01/26/2023   Multinodular goiter 02/27/2022   Encounter for screening mammogram for breast cancer 01/23/2022   High risk medication use 10/07/2021   Atrial fibrillation with RVR (HCC) 04/28/2021   Hypertrophic toenail 04/22/2021   Seropositive rheumatoid arthritis (HCC) 11/25/2020   Bilateral hand pain 11/25/2020   Bilateral shoulder pain 10/16/2020   Joint pain 10/16/2020   Estrogen deficiency 04/30/2020   Dry mouth 03/15/2020   Osteoarthritis of right hip 01/02/2020   Right thyroid nodule 05/12/2018   History of TIA (transient ischemic attack) 05/09/2018   Aortic insufficiency 09/10/2015   Stress reaction 09/08/2015   Encounter for Medicare annual wellness exam 02/28/2013   Bucket-handle tear of lateral meniscus of left knee as current injury 12/30/2012   Episodic atrial fibrillation (HCC) 11/17/2012   Hearing loss of both ears 01/14/2012   B12 deficiency 03/17/2010   GERD (gastroesophageal reflux disease) 03/17/2010   POSTMENOPAUSAL STATUS 08/20/2008   Essential hypertension 07/19/2007   Hyperlipidemia 06/24/2007   IBS 06/24/2007   DERMATITIS, ATOPIC 06/24/2007   SKIN CANCER, HX OF 06/24/2007   Past Medical History:  Diagnosis Date   A-fib (HCC)    Anginal pain (HCC)    Aortic insufficiency    a. 02/2020 Echo: EF 60-65%, no rwma, Gr1 DD, nl RV fxn, mildly  dil LA, triv AI, Asc Ao 39mm; b. 04/2021 Echo: EF 60-65%, no rwma, GrI DD, nl RV fxn, mildly dil LA, AI not visualized. Mild-mod AoV sclerosis w/o stenosis.   Arthritis    Knees   B12 deficiency    Mild   Bucket-handle tear of lateral meniscus of left knee as current injury 12/30/2012   Cancer (HCC)    Skin, basal cell on nose and eyelid   Colitis    DJD (degenerative joint disease)    Low back   Dyspepsia    GERD (gastroesophageal reflux disease)    Heart murmur    Heart palpitations    History of cardiac cath    a. 02/2017 Cath: Nl cors. Nl LV fxn. Abd Aogram w/o RAS.    HOH (hard of hearing)    Hyperlipidemia    Hypertension    IBS (irritable bowel syndrome)    Lactose intolerance    Mixed incontinence    PONV (postoperative nausea and vomiting)    Urinary tract infection    Vulvar irritation    from urine pad, uses Triamcinolone oint.   Past Surgical History:  Procedure Laterality Date   ABDOMINAL AORTOGRAM N/A 03/15/2017   Procedure: Abdominal Aortogram;  Surgeon: Iran Ouch, MD;  Location: [facility];  Service: Cardiovascular;  Laterality: N/A;   ABDOMINAL HYSTERECTOMY  1982   Large fibroids and bleeding   APPENDECTOMY     BILATERAL SALPINGOOPHORECTOMY  1986   BONE GRAFT HIP ILIAC CREST     To Left arm   BREAST REDUCTION SURGERY     BREAST SURGERY     CARDIAC CATHETERIZATION     CATARACT EXTRACTION W/PHACO Left 07/07/2016   Procedure: CATARACT EXTRACTION PHACO AND INTRAOCULAR LENS PLACEMENT (IOC);  Surgeon: Galen Manila, MD;  Location: [facility];  Service: Ophthalmology;  Laterality: Left;  [place] 01:10 AP% 18.3 CDE 12.91 Fluid pak lot # [id number]   CATARACT EXTRACTION W/PHACO Right 07/28/2016   Procedure: CATARACT EXTRACTION PHACO AND INTRAOCULAR LENS PLACEMENT (IOC);  Surgeon: Galen Manila, MD;  Location: [facility];  Service: Ophthalmology;  Laterality: Right;  [place] 01:26 AP% 18.5 CDE 16.12 Fluid pack lot # [id number]   COLONOSCOPY  07/2016   Dr. Lottie Mussel   COLONOSCOPY WITH PROPOFOL N/A 04/01/2023   Procedure: COLONOSCOPY WITH PROPOFOL;  Surgeon: Toney Reil, MD;  Location: [facility];  Service: Gastroenterology;  Laterality: N/A;   DILATION AND CURETTAGE OF UTERUS     miscarragesx2   ESOPHAGOGASTRODUODENOSCOPY     Polyps   HEMORRHOID SURGERY  2006   KNEE ARTHROSCOPY WITH LATERAL MENISECTOMY Left 12/30/2012   Procedure: KNEE ARTHROSCOPY WITH LATERAL MENISECTOMY;  Surgeon: Eulas Post, MD;  Location: [facility];  Service: Orthopedics;  Laterality: Left;  LEFT KNEE SCOPE LATERAL MENISCECTOMY   LEFT  HEART CATH AND CORONARY ANGIOGRAPHY N/A 03/15/2017   Procedure: Left Heart Cath and Coronary Angiography;  Surgeon: Iran Ouch, MD;  Location: [facility];  Service: Cardiovascular;  Laterality: N/A;   MOUTH SURGERY     skin cancer eyelid  2012   TONSILLECTOMY     Social History   Tobacco Use   Smoking status: Former    Current packs/day: 0.00    Average packs/day: 1 pack/day for 12.0 years (12.0 ttl pk-yrs)    Types: Cigarettes    Start date: 11/24/1951    Quit date: 11/24/1963    Years since quitting: 59.6    Passive exposure: Never   Smokeless  tobacco: Never  Vaping Use   Vaping status: Never Used  Substance Use Topics   Alcohol use: No    Alcohol/week: 0.0 standard drinks of alcohol   Drug use: No   Family History  Problem Relation Age of Onset   Cancer Mother        Colon   Heart disease Father        CAD   Diabetes Sister    Hypothyroidism Sister    Diabetes Sister    Alzheimer's disease Sister    Diabetes Brother    Leukemia Brother    Esophageal cancer Brother    Diabetes Brother    Cancer Paternal Aunt        Breast   Breast cancer Daughter    Allergies  Allergen Reactions   Antihistamines, Chlorpheniramine-Type Other (See Comments)    Reaction:  Makes pt hyper    Atenolol Hypertension   Cefuroxime Axetil Other (See Comments)    Upset stomach   Ciprofloxacin Other (See Comments)    Upset stomach   Co Q 10 [Coenzyme Q10] Other (See Comments)    Reaction:  GI upset    Crestor [Rosuvastatin Calcium] Other (See Comments)    Reaction:  Myalgias    Dextromethorphan-Guaifenesin Other (See Comments)    Reaction:  Made pt jittery    Epinephrine Hives   Methenamine     Tongue discomfort    Ramipril Other (See Comments)    Upset stomach   Sulfamethoxazole-Trimethoprim Other (See Comments)    Upset stomach   Vitamin D Analogs Other (See Comments)    Reaction:  GI upset    Current Outpatient Medications on File Prior to Visit  Medication Sig  Dispense Refill   atorvastatin (LIPITOR) 10 MG tablet TAKE 1 TABLET(10 MG) BY MOUTH DAILY (Patient taking differently: Take 10 mg by mouth daily.) 90 tablet 2   Calcium Carbonate-Vitamin D (CALCIUM 600+D PO) Take 1 tablet by mouth daily.      CARTIA XT 180 MG 24 hr capsule TAKE 1 CAPSULE(180 MG) BY MOUTH DAILY 90 capsule 3   clobetasol ointment (TEMOVATE) 0.05 % Apply 1 application topically 2 (two) times daily as needed.     clorazepate (TRANXENE) 7.5 MG tablet Take 1 tablet (7.5 mg total) by mouth daily as needed. 30 tablet 0   cyanocobalamin (,VITAMIN B-12,) 1000 MCG/ML injection Inject 1,000 mcg into the muscle every 30 (thirty) days.      ELIQUIS 5 MG TABS tablet TAKE 1 TABLET BY MOUTH TWICE DAILY 180 tablet 1   estradiol (ESTRACE) 0.1 MG/GM vaginal cream Apply a pea-sized amount to fingertip and wipe in vaginal introitus twice weekly 42.5 g 1   famotidine (PEPCID) 20 MG tablet Take 20 mg by mouth 2 (two) times daily as needed for heartburn or indigestion.     guaiFENesin (MUCINEX) 600 MG 12 hr tablet Take 600 mg by mouth 2 (two) times daily as needed.     hydrocortisone (ANUSOL-HC) 2.5 % rectal cream Apply 1 Application topically at bedtime. To affected area for 10 days 30 g 0   irbesartan (AVAPRO) 300 MG tablet TAKE 1 TABLET BY MOUTH DAILY (Patient taking differently: Take 300 mg by mouth daily.) 90 tablet 2   loperamide (IMODIUM) 2 MG capsule Take 2 mg by mouth as needed for diarrhea or loose stools.     methotrexate 50 MG/2ML injection INJECT 0.6 MLS (15 MG TOTAL) INTO THE SKIN ONCE A WEEK. 8 mL 0   Na Sulfate-K Sulfate-Mg  Sulf 17.5-3.13-1.6 GM/177ML SOLN See admin instructions.     triamcinolone (NASACORT) 55 MCG/ACT AERO nasal inhaler Place 2 sprays into the nose daily. 1 each 12   trimethoprim (TRIMPEX) 100 MG tablet Take 1 tablet (100 mg total) by mouth daily. 30 tablet 2   Tuberculin-Allergy Syringes 27G X 1/2" 1 ML MISC Use 1 syringe once weekly to inject methotrexate. 25 each 1    No current facility-administered medications on file prior to visit.    Review of Systems  Constitutional:  Positive for fatigue. Negative for activity change, appetite change, fever and unexpected weight change.  HENT:  Negative for congestion, ear pain, rhinorrhea, sinus pressure and sore throat.   Eyes:  Negative for pain, redness and visual disturbance.  Respiratory:  Positive for cough and shortness of breath. Negative for wheezing.   Cardiovascular:  Negative for chest pain and palpitations.  Gastrointestinal:  Negative for abdominal pain, blood in stool, constipation and diarrhea.  Endocrine: Negative for polydipsia and polyuria.  Genitourinary:  Negative for dysuria, frequency and urgency.  Musculoskeletal:  Negative for arthralgias, back pain and myalgias.  Skin:  Negative for pallor and rash.  Allergic/Immunologic: Negative for environmental allergies.  Neurological:  Positive for headaches. Negative for dizziness and syncope.  Hematological:  Negative for adenopathy. Does not bruise/bleed easily.  Psychiatric/Behavioral:  Negative for decreased concentration and dysphoric mood. The patient is not nervous/anxious.        Objective:   Physical Exam Constitutional:      General: She is not in acute distress.    Appearance: Normal appearance. She is well-developed. She is obese. She is not ill-appearing, toxic-appearing or diaphoretic.  HENT:     Head: Normocephalic and atraumatic.     Comments: No facial swelling or tenderness    Right Ear: Tympanic membrane, ear canal and external ear normal.     Left Ear: Tympanic membrane, ear canal and external ear normal.     Nose: Congestion and rhinorrhea present.     Comments: Nares are boggy    Mouth/Throat:     Mouth: Mucous membranes are moist.     Pharynx: Oropharynx is clear. No oropharyngeal exudate or posterior oropharyngeal erythema.     Comments: Clear pnd  Eyes:     General:        Right eye: No discharge.         Left eye: No discharge.     Conjunctiva/sclera: Conjunctivae normal.     Pupils: Pupils are equal, round, and reactive to light.  Cardiovascular:     Rate and Rhythm: Normal rate.     Heart sounds: Normal heart sounds.     Comments: HR 90 borderline tachy -is actively coughing  Pulmonary:     Effort: Pulmonary effort is normal. No respiratory distress.     Breath sounds: No stridor. Rhonchi present. No wheezing or rales.     Comments: Few scattered rhonchi No rales Good air exch Chest:     Chest wall: No tenderness.  Musculoskeletal:     Cervical back: Normal range of motion and neck supple.  Lymphadenopathy:     Cervical: No cervical adenopathy.  Skin:    General: Skin is warm and dry.     Capillary Refill: Capillary refill takes less than 2 seconds.     Coloration: Skin is not pale.     Findings: No rash.  Neurological:     Mental Status: She is alert.     Cranial Nerves: No cranial nerve  deficit.  Psychiatric:        Mood and Affect: Mood normal.           Assessment & Plan:   Problem List Items Addressed This Visit       Respiratory   Viral URI with cough - Primary    3 weeks Started with head cold symptoms (did not do a covid test) Now cough is mostly dry/occational mucous with color  Suspect post viral cough syndrome with bronchitis   To stop cough cycle prescription tessalon and tussionex (with caution) Prednisone 20 mg for 5 d (discussed side effects)  Fluids/ rest  Cxr now - clear/ no infiltrate -very reassuring   Update if not starting to improve in a week or if worsening  Call back and Er precautions noted in detail today         Relevant Orders   DG Chest 2 View (Completed)

## 2023-07-21 NOTE — Assessment & Plan Note (Addendum)
3 weeks Started with head cold symptoms (did not do a covid test) Now cough is mostly dry/occational mucous with color  Suspect post viral cough syndrome with bronchitis   To stop cough cycle prescription tessalon and tussionex (with caution) Prednisone 20 mg for 5 d (discussed side effects)  Fluids/ rest  Cxr now - clear/ no infiltrate -very reassuring   Update if not starting to improve in a week or if worsening  Call back and Er precautions noted in detail today

## 2023-07-21 NOTE — Patient Instructions (Signed)
Drink fluids Chest xray now -we will contact you with results   Take the tessalon for cough  Take the tussionex for cough at bedtime (careful of sedation)  Take prednisone for bronchitis/ wheezing (it will make you feel hyper and hungry)   If chest xray shows any signs of pneumonia we will start an antibiotic   Wear a mask in public until you feel better

## 2023-07-22 DIAGNOSIS — N3020 Other chronic cystitis without hematuria: Secondary | ICD-10-CM | POA: Diagnosis not present

## 2023-07-27 ENCOUNTER — Encounter: Payer: Self-pay | Admitting: Family Medicine

## 2023-07-27 ENCOUNTER — Ambulatory Visit (INDEPENDENT_AMBULATORY_CARE_PROVIDER_SITE_OTHER): Payer: Medicare Other

## 2023-07-27 DIAGNOSIS — E538 Deficiency of other specified B group vitamins: Secondary | ICD-10-CM | POA: Diagnosis not present

## 2023-07-27 MED ORDER — CYANOCOBALAMIN 1000 MCG/ML IJ SOLN
1000.0000 ug | Freq: Once | INTRAMUSCULAR | Status: AC
Start: 2023-07-27 — End: 2023-07-27
  Administered 2023-07-27: 1000 ug via INTRAMUSCULAR

## 2023-07-27 NOTE — Progress Notes (Signed)
Per orders of Dr. Roxy Manns, injection of B-12 given by Donnamarie Poag in left deltoid. Patient tolerated injection well. Patient will make appointment for 1 month.

## 2023-07-27 NOTE — Telephone Encounter (Signed)
Internal hemorrhoids tend to bleed but usually do not hurt  External ones itch and burn- less likely to bleed Please ask which symptoms she has so I can guide treatment plan Also ? Abd pain or fever or any other symptoms  Thanks

## 2023-07-28 MED ORDER — HYDROCORTISONE ACETATE 25 MG RE SUPP: 25.0000 mg | Freq: Two times a day (BID) | RECTAL | 0 refills | Status: AC

## 2023-07-28 NOTE — Addendum Note (Signed)
Addended by: Roxy Manns A on: 07/28/2023 08:55 PM   Modules accepted: Orders

## 2023-07-28 NOTE — Telephone Encounter (Signed)
Hemorrhoids are internal.  She is not having any other symptoms.  Is concerned because she knows she has internal hemorrhoids and is on blood thinner.  Would like prescription sent to [facility].

## 2023-08-02 ENCOUNTER — Encounter: Payer: Self-pay | Admitting: Family Medicine

## 2023-08-02 ENCOUNTER — Ambulatory Visit (INDEPENDENT_AMBULATORY_CARE_PROVIDER_SITE_OTHER): Payer: Medicare Other | Admitting: Family Medicine

## 2023-08-02 VITALS — BP 128/64 | HR 93 | Temp 97.6°F | Ht 64.5 in | Wt 172.1 lb

## 2023-08-02 DIAGNOSIS — R0989 Other specified symptoms and signs involving the circulatory and respiratory systems: Secondary | ICD-10-CM

## 2023-08-02 DIAGNOSIS — J069 Acute upper respiratory infection, unspecified: Secondary | ICD-10-CM

## 2023-08-02 DIAGNOSIS — U071 COVID-19: Secondary | ICD-10-CM | POA: Diagnosis not present

## 2023-08-02 DIAGNOSIS — M059 Rheumatoid arthritis with rheumatoid factor, unspecified: Secondary | ICD-10-CM

## 2023-08-02 LAB — POC COVID19 BINAXNOW: SARS Coronavirus 2 Ag: POSITIVE — AB

## 2023-08-02 MED ORDER — ALBUTEROL SULFATE HFA 108 (90 BASE) MCG/ACT IN AERS: 2.0000 | INHALATION_SPRAY | RESPIRATORY_TRACT | 0 refills | Status: DC | PRN

## 2023-08-02 NOTE — Progress Notes (Signed)
Subjective:    Patient ID: Tammy Manning, female    DOB: 1935/08/21, 88 y.o.   MRN: [id number]  HPI  Wt Readings from Last 3 Encounters:  08/02/23 172 lb 2 oz (78.1 kg)  07/21/23 176 lb 2 oz (79.9 kg)  07/07/23 174 lb (78.9 kg)   29.09 kg/m  Vitals:   08/02/23 1151  BP: 128/64  Pulse: 93  Temp: 97.6 F (36.4 C)  SpO2: 97%    Pt presents for uri symptoms   Covid test is positive today   Has had symptoms on/off for 2 months  Now very raspy throat /voice  Very weak   No body aches  No chills   Sense of taste is different-dislikes coffee   Ears have been full  Throat has felt a little sore  Congestion and pnd - cannot blow much out  Mucous from throat has color to it  Eyes ache -mild   Cough does not hurt her chest  Does not cough a lot   Very tired and weak    Results for orders placed or performed in visit on 08/02/23  POC COVID-19  Result Value Ref Range   SARS Coronavirus 2 Ag Positive (A) Negative     Patient Active Problem List   Diagnosis Date Noted   COVID-19 08/02/2023   Viral URI with cough 07/21/2023   Abrasion of left arm 04/28/2023   Head injury 04/28/2023   Superficial injury of skin 04/21/2023   Dysuria 04/12/2023   Diverticulosis 03/21/2023   Acute colitis 03/13/2023   Chronic diastolic CHF (congestive heart failure) (HCC) 03/13/2023   Obesity (BMI 30-39.9) 03/13/2023   PAF (paroxysmal atrial fibrillation) (HCC) 03/13/2023   Recurrent UTI (urinary tract infection) 02/18/2023   Intertrigo 02/16/2023   Hemorrhoids 02/02/2023   Urinary incontinence 02/02/2023   Acute cystitis 01/26/2023   Dilated aortic root (HCC) 01/26/2023   Osteopenia 01/26/2023   Multinodular goiter 02/27/2022   Encounter for screening mammogram for breast cancer 01/23/2022   High risk medication use 10/07/2021   Atrial fibrillation with RVR (HCC) 04/28/2021   Hypertrophic toenail 04/22/2021   Seropositive rheumatoid arthritis (HCC) 11/25/2020    Bilateral hand pain 11/25/2020   Bilateral shoulder pain 10/16/2020   Joint pain 10/16/2020   Estrogen deficiency 04/30/2020   Dry mouth 03/15/2020   Osteoarthritis of right hip 01/02/2020   Right thyroid nodule 05/12/2018   History of TIA (transient ischemic attack) 05/09/2018   Aortic insufficiency 09/10/2015   Stress reaction 09/08/2015   Encounter for Medicare annual wellness exam 02/28/2013   Bucket-handle tear of lateral meniscus of left knee as current injury 12/30/2012   Episodic atrial fibrillation (HCC) 11/17/2012   Hearing loss of both ears 01/14/2012   B12 deficiency 03/17/2010   GERD (gastroesophageal reflux disease) 03/17/2010   POSTMENOPAUSAL STATUS 08/20/2008   Essential hypertension 07/19/2007   Hyperlipidemia 06/24/2007   IBS 06/24/2007   DERMATITIS, ATOPIC 06/24/2007   SKIN CANCER, HX OF 06/24/2007   Past Medical History:  Diagnosis Date   A-fib (HCC)    Anginal pain (HCC)    Aortic insufficiency    a. 02/2020 Echo: EF 60-65%, no rwma, Gr1 DD, nl RV fxn, mildly dil LA, triv AI, Asc Ao 39mm; b. 04/2021 Echo: EF 60-65%, no rwma, GrI DD, nl RV fxn, mildly dil LA, AI not visualized. Mild-mod AoV sclerosis w/o stenosis.   Arthritis    Knees   B12 deficiency    Mild   Bucket-handle tear of lateral meniscus  of left knee as current injury 12/30/2012   Cancer (HCC)    Skin, basal cell on nose and eyelid   Colitis    DJD (degenerative joint disease)    Low back   Dyspepsia    GERD (gastroesophageal reflux disease)    Heart murmur    Heart palpitations    History of cardiac cath    a. 02/2017 Cath: Nl cors. Nl LV fxn. Abd Aogram w/o RAS.   HOH (hard of hearing)    Hyperlipidemia    Hypertension    IBS (irritable bowel syndrome)    Lactose intolerance    Mixed incontinence    PONV (postoperative nausea and vomiting)    Urinary tract infection    Vulvar irritation    from urine pad, uses Triamcinolone oint.   Past Surgical History:  Procedure Laterality  Date   ABDOMINAL AORTOGRAM N/A 03/15/2017   Procedure: Abdominal Aortogram;  Surgeon: Iran Ouch, MD;  Location: [facility];  Service: Cardiovascular;  Laterality: N/A;   ABDOMINAL HYSTERECTOMY  1982   Large fibroids and bleeding   APPENDECTOMY     BILATERAL SALPINGOOPHORECTOMY  1986   BONE GRAFT HIP ILIAC CREST     To Left arm   BREAST REDUCTION SURGERY     BREAST SURGERY     CARDIAC CATHETERIZATION     CATARACT EXTRACTION W/PHACO Left 07/07/2016   Procedure: CATARACT EXTRACTION PHACO AND INTRAOCULAR LENS PLACEMENT (IOC);  Surgeon: Galen Manila, MD;  Location: [facility];  Service: Ophthalmology;  Laterality: Left;  [place] 01:10 AP% 18.3 CDE 12.91 Fluid pak lot # [id number]   CATARACT EXTRACTION W/PHACO Right 07/28/2016   Procedure: CATARACT EXTRACTION PHACO AND INTRAOCULAR LENS PLACEMENT (IOC);  Surgeon: Galen Manila, MD;  Location: [facility];  Service: Ophthalmology;  Laterality: Right;  [place] 01:26 AP% 18.5 CDE 16.12 Fluid pack lot # [id number]   COLONOSCOPY  07/2016   Dr. Lottie Mussel   COLONOSCOPY WITH PROPOFOL N/A 04/01/2023   Procedure: COLONOSCOPY WITH PROPOFOL;  Surgeon: Toney Reil, MD;  Location: [facility];  Service: Gastroenterology;  Laterality: N/A;   DILATION AND CURETTAGE OF UTERUS     miscarragesx2   ESOPHAGOGASTRODUODENOSCOPY     Polyps   HEMORRHOID SURGERY  2006   KNEE ARTHROSCOPY WITH LATERAL MENISECTOMY Left 12/30/2012   Procedure: KNEE ARTHROSCOPY WITH LATERAL MENISECTOMY;  Surgeon: Eulas Post, MD;  Location: [facility];  Service: Orthopedics;  Laterality: Left;  LEFT KNEE SCOPE LATERAL MENISCECTOMY   LEFT HEART CATH AND CORONARY ANGIOGRAPHY N/A 03/15/2017   Procedure: Left Heart Cath and Coronary Angiography;  Surgeon: Iran Ouch, MD;  Location: [facility];  Service: Cardiovascular;  Laterality: N/A;   MOUTH SURGERY     skin cancer eyelid  2012   TONSILLECTOMY     Social History   Tobacco Use   Smoking  status: Former    Current packs/day: 0.00    Average packs/day: 1 pack/day for 12.0 years (12.0 ttl pk-yrs)    Types: Cigarettes    Start date: 11/24/1951    Quit date: 11/24/1963    Years since quitting: 59.7    Passive exposure: Never   Smokeless tobacco: Never  Vaping Use   Vaping status: Never Used  Substance Use Topics   Alcohol use: No    Alcohol/week: 0.0 standard drinks of alcohol   Drug use: No   Family History  Problem Relation Age of Onset   Cancer Mother  Colon   Heart disease Father        CAD   Diabetes Sister    Hypothyroidism Sister    Diabetes Sister    Alzheimer's disease Sister    Diabetes Brother    Leukemia Brother    Esophageal cancer Brother    Diabetes Brother    Cancer Paternal Aunt        Breast   Breast cancer Daughter    Allergies  Allergen Reactions   Antihistamines, Chlorpheniramine-Type Other (See Comments)    Reaction:  Makes pt hyper    Atenolol Hypertension   Cefuroxime Axetil Other (See Comments)    Upset stomach   Ciprofloxacin Other (See Comments)    Upset stomach   Co Q 10 [Coenzyme Q10] Other (See Comments)    Reaction:  GI upset    Crestor [Rosuvastatin Calcium] Other (See Comments)    Reaction:  Myalgias    Dextromethorphan-Guaifenesin Other (See Comments)    Reaction:  Made pt jittery    Epinephrine Hives   Methenamine     Tongue discomfort    Ramipril Other (See Comments)    Upset stomach   Sulfamethoxazole-Trimethoprim Other (See Comments)    Upset stomach   Vitamin D Analogs Other (See Comments)    Reaction:  GI upset    Current Outpatient Medications on File Prior to Visit  Medication Sig Dispense Refill   atorvastatin (LIPITOR) 10 MG tablet TAKE 1 TABLET(10 MG) BY MOUTH DAILY (Patient taking differently: Take 10 mg by mouth daily.) 90 tablet 2   Calcium Carbonate-Vitamin D (CALCIUM 600+D PO) Take 1 tablet by mouth daily.      CARTIA XT 180 MG 24 hr capsule TAKE 1 CAPSULE(180 MG) BY MOUTH DAILY 90 capsule  3   clobetasol ointment (TEMOVATE) 0.05 % Apply 1 application topically 2 (two) times daily as needed.     clorazepate (TRANXENE) 7.5 MG tablet Take 1 tablet (7.5 mg total) by mouth daily as needed. 30 tablet 0   cyanocobalamin (,VITAMIN B-12,) 1000 MCG/ML injection Inject 1,000 mcg into the muscle every 30 (thirty) days.      ELIQUIS 5 MG TABS tablet TAKE 1 TABLET BY MOUTH TWICE DAILY 180 tablet 1   estradiol (ESTRACE) 0.1 MG/GM vaginal cream Apply a pea-sized amount to fingertip and wipe in vaginal introitus twice weekly 42.5 g 1   famotidine (PEPCID) 20 MG tablet Take 20 mg by mouth 2 (two) times daily as needed for heartburn or indigestion.     guaiFENesin (MUCINEX) 600 MG 12 hr tablet Take 600 mg by mouth 2 (two) times daily as needed.     hydrocortisone (ANUSOL-HC) 2.5 % rectal cream Apply 1 Application topically at bedtime. To affected area for 10 days 30 g 0   hydrocortisone (ANUSOL-HC) 25 MG suppository Place 1 suppository (25 mg total) rectally 2 (two) times daily. 10 suppository 0   irbesartan (AVAPRO) 300 MG tablet TAKE 1 TABLET BY MOUTH DAILY (Patient taking differently: Take 300 mg by mouth daily.) 90 tablet 2   loperamide (IMODIUM) 2 MG capsule Take 2 mg by mouth as needed for diarrhea or loose stools.     methotrexate 50 MG/2ML injection INJECT 0.6 MLS (15 MG TOTAL) INTO THE SKIN ONCE A WEEK. 8 mL 0   Na Sulfate-K Sulfate-Mg Sulf 17.5-3.13-1.6 GM/177ML SOLN See admin instructions.     triamcinolone (NASACORT) 55 MCG/ACT AERO nasal inhaler Place 2 sprays into the nose daily. 1 each 12   trimethoprim (TRIMPEX) 100 MG  tablet Take 1 tablet (100 mg total) by mouth daily. 30 tablet 2   Tuberculin-Allergy Syringes 27G X 1/2" 1 ML MISC Use 1 syringe once weekly to inject methotrexate. 25 each 1   No current facility-administered medications on file prior to visit.    Review of Systems  Constitutional:  Positive for appetite change and fatigue. Negative for fever.  HENT:  Positive for  congestion, postnasal drip, rhinorrhea, sore throat and voice change. Negative for ear pain, sinus pressure and sneezing.   Eyes:  Negative for pain and discharge.  Respiratory:  Positive for cough and shortness of breath. Negative for wheezing and stridor.        Shortness of breath on exertion- thinks from fatigue    Cardiovascular:  Negative for chest pain.  Gastrointestinal:  Negative for diarrhea, nausea and vomiting.  Genitourinary:  Negative for frequency, hematuria and urgency.  Musculoskeletal:  Negative for arthralgias and myalgias.  Skin:  Negative for rash.  Neurological:  Positive for headaches. Negative for dizziness, weakness and light-headedness.       A little headache around eyes   Psychiatric/Behavioral:  Negative for confusion and dysphoric mood.        Objective:   Physical Exam Constitutional:      General: She is not in acute distress.    Appearance: Normal appearance. She is well-developed. She is not ill-appearing, toxic-appearing or diaphoretic.  HENT:     Head: Normocephalic and atraumatic.     Comments: Nares are injected and congested      Right Ear: Tympanic membrane, ear canal and external ear normal.     Left Ear: Tympanic membrane, ear canal and external ear normal.     Ears:     Comments: Also has hearing aides     Nose: Congestion and rhinorrhea present.     Mouth/Throat:     Mouth: Mucous membranes are moist.     Pharynx: Oropharynx is clear. Posterior oropharyngeal erythema present. No oropharyngeal exudate.     Comments: Clear pnd   Mild posterior injection No lesions  Eyes:     General: No scleral icterus.       Right eye: No discharge.        Left eye: No discharge.     Conjunctiva/sclera: Conjunctivae normal.     Pupils: Pupils are equal, round, and reactive to light.  Cardiovascular:     Rate and Rhythm: Normal rate.     Heart sounds: Normal heart sounds.  Pulmonary:     Effort: Pulmonary effort is normal. No respiratory  distress.     Breath sounds: No stridor. Rhonchi present. No wheezing or rales.     Comments: Good air exch Occational scattered rhonchi  No rales No crackles Not shortness of breath with walking or speech Chest:     Chest wall: No tenderness.  Musculoskeletal:     Cervical back: Normal range of motion and neck supple.  Lymphadenopathy:     Cervical: No cervical adenopathy.  Skin:    General: Skin is warm and dry.     Capillary Refill: Capillary refill takes less than 2 seconds.     Findings: No rash.  Neurological:     Mental Status: She is alert.     Cranial Nerves: No cranial nerve deficit.  Psychiatric:        Mood and Affect: Mood normal.           Assessment & Plan:   Problem List Items Addressed This Visit  Respiratory   Viral URI with cough    Pt now has some more raspiness in throat and exhaustion  Covid test is positive today  See a/p for covid  She did finish 5 d course of prednisone and has no wheezing         Musculoskeletal and Integument   Seropositive rheumatoid arthritis (HCC)    Taking methotrexate  Overall doing well  Sees rheum Dr Dimple Casey  Lab Results  Component Value Date   WBC 10.1 07/07/2023   HGB 11.7 07/07/2023   HCT 34.6 (L) 07/07/2023   MCV 100.3 (H) 07/07/2023   PLT 282 07/07/2023   Pt had concerns about her cbc Looks like this wbc was normal unless she had one since then that was lower  Explained that auto immune dz and acute viral illness can lower wbc and that methotrexate can affect cbc   May want to consider holding the methotrexate while sick with covid         Other   COVID-19 - Primary    Pt has had some symptoms about 2 weeks - but raspy throat and fatigue are the newest symptoms  Reassuring exam Few rhonchi noted Pt declines prednisone due to side effects but will call if symptoms worsen Sent albuterol mdi  Symptom care and isolation / quarantine recommendations- see AVS. Update if not starting to improve  in a week or if worsening  Call back and Er precautions noted in detail today        Other Visit Diagnoses     Chest congestion       Relevant Orders   POC COVID-19 (Completed)

## 2023-08-02 NOTE — Assessment & Plan Note (Signed)
Pt has had some symptoms about 2 weeks - but raspy throat and fatigue are the newest symptoms  Reassuring exam Few rhonchi noted Pt declines prednisone due to side effects but will call if symptoms worsen Sent albuterol mdi  Symptom care and isolation / quarantine recommendations- see AVS. Update if not starting to improve in a week or if worsening  Call back and Er precautions noted in detail today

## 2023-08-02 NOTE — Assessment & Plan Note (Addendum)
Pt now has some more raspiness in throat and exhaustion  Covid test is positive today  See a/p for covid  She did finish 5 d course of prednisone and has no wheezing

## 2023-08-02 NOTE — Assessment & Plan Note (Addendum)
Taking methotrexate  Overall doing well  Sees rheum Dr Dimple Casey  Lab Results  Component Value Date   WBC 10.1 07/07/2023   HGB 11.7 07/07/2023   HCT 34.6 (L) 07/07/2023   MCV 100.3 (H) 07/07/2023   PLT 282 07/07/2023   Pt had concerns about her cbc Looks like this wbc was normal unless she had one since then that was lower  Explained that auto immune dz and acute viral illness can lower wbc and that methotrexate can affect cbc   May want to consider holding the methotrexate while sick with covid

## 2023-08-02 NOTE — Patient Instructions (Addendum)
Drink fluids and rest  mucinex DM is good for cough and congestion if needed  Nasal saline for congestion as needed  Tylenol for fever or pain or headache  Please alert [place] if symptoms worsen (if severe or short of breath please go to the ER)   Isolate yourself until you feel better  ( 5 days minimum)  Then when in public mask for an additional 10 days  Watch for fever Watch for wheezing and worse shortness of breath and worse   We may need prednisone later if you get more tight breathing   I will send an albuterol inhaler to use as needed

## 2023-08-03 ENCOUNTER — Encounter: Payer: Self-pay | Admitting: Family Medicine

## 2023-08-03 ENCOUNTER — Telehealth: Payer: Self-pay | Admitting: *Deleted

## 2023-08-03 NOTE — Telephone Encounter (Signed)
Patient advised to hold methotrexate for two weeks after her symptoms have gone away. Advised the patient she could get cleared by her doctor as well to restart her methotrexate but she needs to wait a minimum of two weeks after her symptoms have gone away. Patient states she did take her methotrexate on Monday before she went to the doctor and got diagnosed with COVID.

## 2023-08-03 NOTE — Telephone Encounter (Signed)
Patient contact the office and left message stating she was diagnosed with Covid. Patient states she was advised by Dr. Milinda Antis to contact our office regarding her MTX.   Attempted to contact the patient and left message for patient to call the office.

## 2023-08-04 MED ORDER — PREDNISONE 20 MG PO TABS
20.0000 mg | ORAL_TABLET | Freq: Every day | ORAL | 0 refills | Status: DC
Start: 2023-08-04 — End: 2023-09-20

## 2023-08-11 ENCOUNTER — Encounter: Payer: Self-pay | Admitting: Family Medicine

## 2023-08-19 ENCOUNTER — Ambulatory Visit: Payer: Medicare Other | Admitting: Urology

## 2023-08-19 DIAGNOSIS — M25552 Pain in left hip: Secondary | ICD-10-CM | POA: Diagnosis not present

## 2023-08-26 ENCOUNTER — Ambulatory Visit: Payer: Medicare Other

## 2023-08-26 DIAGNOSIS — E538 Deficiency of other specified B group vitamins: Secondary | ICD-10-CM

## 2023-08-26 MED ORDER — CYANOCOBALAMIN 1000 MCG/ML IJ SOLN
1000.0000 ug | Freq: Once | INTRAMUSCULAR | Status: AC
Start: 2023-08-26 — End: 2023-08-26
  Administered 2023-08-26: 1000 ug via INTRAMUSCULAR

## 2023-08-26 NOTE — Progress Notes (Signed)
Per orders of Dr. Roxy Manns, injection of vitamin b 12 given by Lewanda Rife in left deltoid. Patient tolerated injection well. Patient will make appointment for 1 month.

## 2023-08-31 DIAGNOSIS — Z23 Encounter for immunization: Secondary | ICD-10-CM | POA: Diagnosis not present

## 2023-09-03 ENCOUNTER — Telehealth: Payer: Self-pay | Admitting: Cardiovascular Disease

## 2023-09-03 NOTE — Telephone Encounter (Signed)
Called patient, she states that she is just not feeling right, she states it is hard to explain- but she feels funny in her chest, she has been having swelling in her ankles- it does not get better overnight, she is SOB (states this has been there since she had covid last month) she also states she is having issues with balance and feels like the swelling is not helping. She does check her HR/BP at home- did not have readings to provide, but states they are generally okay. She does mention she has lightheadedness with moving around (sitting to standing).  Patient is due for a follow up per last OV (I did schedule 6 month follow up with Dr.Arida per patient request, scheduled for a follow up for swelling and sob with NP on Monday 10/14- will route to both providers to make aware.   Patient verbalized understanding, thankful for call back.

## 2023-09-03 NOTE — Telephone Encounter (Signed)
Pt c/o swelling: STAT is pt has developed SOB within 24 hours  How much weight have you gained and in what time span? A couple lbs   If swelling, where is the swelling located? Ankles   Are you currently taking a fluid pill? No   Are you currently SOB? At times - recently had covid   Do you have a log of your daily weights (if so, list)? 168 to 173 lbs in a week in half   Have you gained 3 pounds in a day or 5 pounds in a week? Not sure   Have you traveled recently? No   Patient states she had covid recently, and states she is also feeling like her balance is off.

## 2023-09-05 NOTE — Progress Notes (Unsigned)
Cardiology Clinic Note   Date: 09/06/2023 ID: Aldean Ast, DOB 1935/05/12, MRN [id number]  Primary Cardiologist:  Lorine Bears, MD  Patient Profile    Tammy Manning is a 88 y.o. female who presents to the clinic today for evaluation of dizziness.     Past medical history significant for: PAF. 14-day ZIO 07/16/2022: Min HR 55 bpm, max HR 120 bpm, average HR 75 bpm.  1 run of NSVT lasting 7 beats max rate 150 bpm.  13 runs of SVT, fastest lasting 4 beats max 190 bpm, longest lasting 7 beats average 93 bpm. Chronic HFpEF. Echo 04/29/2021: EF 60 to 65%.  No RWMA.  Mild LVH.  Grade I DD.  Normal RV function.  Normal PA pressure.  Mild LAE.  Mild to moderate aortic valve sclerosis/calcification without stenosis. Hypertension. Hyperlipidemia. GERD.     History of Present Illness    Tammy Manning is followed by Dr. Kirke Corin for the above outlined history.  In summary, she was first evaluated on 11/26/2012 for possible A-fib at the request of Dr. Milinda Antis.  She had been seen in the emergency room on Christmas Eve with sensation of heart going fast and irregular.  EKG showed normal sinus rhythm.  Her ED workup was unremarkable.  She was in normal sinus rhythm at the time of her visit.  She wore a 30-day event monitor which showed no evidence of A-fib or other arrhythmias.  Echo showed normal LV function.  She was seen in April 2018 with complaints of chest heaviness and shortness of breath with minimal activity.  She underwent LHC which showed normal coronary arteries.  She underwent hospital mission in June 2024 A-fib.  She was started on a diltiazem drip and converted to normal sinus rhythm.  She was switched from chlorthalidone to diltiazem and started on Eliquis.  Patient was last seen in the office on 03/12/2023 for routine follow-up.  She was doing well at that time with reports of minimal palpitations.  No medication changes were made at that time.  Patient contacted the office on  09/03/2023 with complaints of feeling unwell.  Per triage RN: Called patient, she states that she is just not feeling right, she states it is hard to explain- but she feels funny in her chest, she has been having swelling in her ankles- it does not get better overnight, she is SOB (states this has been there since she had covid last month) she also states she is having issues with balance and feels like the swelling is not helping. She does check her HR/BP at home- did not have readings to provide, but states they are generally okay. She does mention she has lightheadedness with moving around (sitting to standing).  Discussed the use of AI scribe software for clinical note transcription with the patient, who gave verbal consent to proceed.  The patient is accompanied by her husband. Patient mentions that she had Covid in September with nasal congestion, cough and fatigue. Symptoms lasted until the end of September. She began feeling better than these symptoms started. She reports a week-long history of "dizziness" and a sensation of "being pulled" to the right. The dizziness is described as feeling unsteady not a spinning sensation. It occurs upon standing and does not resolve with walking. She used a walker at home to prevent a fall. The patient also reports intermittent chest tightness associated with unsteadiness. She is unsure if she had any shortness of breath with her symptoms as she had some dyspnea with  Covid (as reported to triage RN). Unsteadiness is slowly improving since Friday and chest tightness is completely resolved. She is not utilizing a walker today and did not experience any unsteadiness or chest pain walking from the car to the building or into the exam room. She checked her BP over the weekend and it was 120-141/53-62. She reported ankle edema to the triage RN on Friday but has no edema today.  The patient also mentions a persistent urinary tract infection and is staying well-hydrated  because of that.        ROS: All other systems reviewed and are otherwise negative except as noted in History of Present Illness.  Studies Reviewed    EKG Interpretation Date/Time:  Monday September 06 2023 14:05:30 EDT Ventricular Rate:  83 PR Interval:  162 QRS Duration:  70 QT Interval:  336 QTC Calculation: 394 R Axis:   -4  Text Interpretation: Normal sinus rhythm Minimal voltage criteria for LVH, may be normal variant ( R in aVL ) When compared with ECG of 13-Mar-2023 09:12, Premature ventricular complexes are no longer Present Confirmed by Carlos Levering [phone]) on 09/06/2023 2:20:27 PM           Physical Exam    VS:  BP (!) 118/52   Pulse 83   Ht 5\' 4"  (1.626 m)   Wt 174 lb (78.9 kg)   SpO2 95%   BMI 29.87 kg/m  , BMI Body mass index is 29.87 kg/m.  Orthostatic VS for the past 24 hrs (Last 3 readings):  BP- Lying Pulse- Lying BP- Sitting Pulse- Sitting BP- Standing at 0 minutes Pulse- Standing at 0 minutes BP- Standing at 3 minutes Pulse- Standing at 3 minutes  09/06/23 1441 139/72 75 129/71 78 129/74 86 126/68 90     GEN: Well nourished, well developed, in no acute distress. Neck: No JVD or carotid bruits. Cardiac: RRR. No murmurs. No rubs or gallops.   Respiratory:  Respirations regular and unlabored. Clear to auscultation without rales, wheezing or rhonchi. GI: Soft, nontender, nondistended. Extremities: Radials/DP/PT 2+ and equal bilaterally. No clubbing or cyanosis. No edema.  Skin: Warm and dry, no rash. Neuro: Strength intact.  Assessment & Plan      Unsteadiness/Chest tightness Recent history of COVID-19 with resolution of symptoms, followed by onset of unsteadiness upon standing. Associated chest tightness with unsteadiness. No palpitations. Shortness of breath started with Covid. She is uncertain if it worsened with onset of unsteadiness. No chest tightness since Friday. Unsteadiness has been slowly resolving. Orthostatic blood pressure  measurements stable with no symptoms reported. -Order comprehensive metabolic panel, complete blood count, and thyroid function tests to assess for electrolyte imbalances, anemia, infection, and thyroid dysfunction.  Chronic HFpEF Echo June 2022 showed normal LV/RV function with LVH and grade I DD.  Dyspnea since Covid infection in September. Patient reported bilateral ankle edema to triage RN on Friday. No edema today. Euvolemic and well compensated on exam.  -Order echo. -Continue irbesartan.   PAF Cardiac monitor August 2023 showed 1 run of NSVT and 13 runs of SVT. Patient denies palpitations. EKG shows NSR today.  -Continue diltiazem and Eliquis.      Disposition: CBC, BMP, TSH. Echo. Return for previously scheduled visit with Dr. Kirke Corin 09/30/2023 or sooner as needed.          Signed, Etta Grandchild. Arjen Deringer, DNP, NP-C

## 2023-09-06 ENCOUNTER — Ambulatory Visit: Payer: Medicare Other | Attending: Student | Admitting: Student

## 2023-09-06 ENCOUNTER — Encounter: Payer: Self-pay | Admitting: Student

## 2023-09-06 VITALS — BP 118/52 | HR 83 | Ht 64.0 in | Wt 174.0 lb

## 2023-09-06 DIAGNOSIS — R0789 Other chest pain: Secondary | ICD-10-CM | POA: Diagnosis not present

## 2023-09-06 DIAGNOSIS — I5032 Chronic diastolic (congestive) heart failure: Secondary | ICD-10-CM | POA: Insufficient documentation

## 2023-09-06 DIAGNOSIS — I480 Paroxysmal atrial fibrillation: Secondary | ICD-10-CM | POA: Insufficient documentation

## 2023-09-06 DIAGNOSIS — R2681 Unsteadiness on feet: Secondary | ICD-10-CM | POA: Insufficient documentation

## 2023-09-06 DIAGNOSIS — R42 Dizziness and giddiness: Secondary | ICD-10-CM | POA: Diagnosis not present

## 2023-09-06 NOTE — Patient Instructions (Signed)
Medication Instructions:  Your Physician recommend you continue on your current medication as directed.    *If you need a refill on your cardiac medications before your next appointment, please call your pharmacy*   Lab Work: Your provider would like for you to have following labs drawn today CBC, CMP, and TSH.   If you have labs (blood work) drawn today and your tests are completely normal, you will receive your results only by: MyChart Message (if you have MyChart) OR A paper copy in the mail If you have any lab test that is abnormal or we need to change your treatment, we will call you to review the results.   Testing/Procedures: Your physician has requested that you have an echocardiogram PRIOR TO 09/30/2023. Echocardiography is a painless test that uses sound waves to create images of your heart. It provides your doctor with information about the size and shape of your heart and how well your heart's chambers and valves are working.   You may receive an ultrasound enhancing agent through an IV if needed to better visualize your heart during the echo. This procedure takes approximately one hour.  There are no restrictions for this procedure.  This will take place at [address] ([facility] [address], [place] [address]    Follow-Up: At [facility], you and your health needs are our priority.  As part of our continuing mission to provide you with exceptional heart care, we have created designated Provider Care Teams.  These Care Teams include your primary Cardiologist (physician) and Advanced Practice Providers (APPs -  Physician Assistants and Nurse Practitioners) who all work together to provide you with the care you need, when you need it.  We recommend signing up for the patient portal called "MyChart".  Sign up information is provided on this After Visit Summary.  MyChart is used to connect with patients for Virtual Visits (Telemedicine).  Patients are able to  view lab/test results, encounter notes, upcoming appointments, etc.  Non-urgent messages can be sent to your provider as well.   To learn more about what you can do with MyChart, go to [email or web address].    Your next appointment:   Keep your appt with Dr Kirke Corin on 09/30/2023  Provider:   You may see Lorine Bears, MD or one of the following Advanced Practice Providers on your designated Care Team:   Nicolasa Ducking, NP Eula Listen, PA-C Cadence Fransico Michael, PA-C Charlsie Quest, NP

## 2023-09-09 ENCOUNTER — Other Ambulatory Visit
Admission: RE | Admit: 2023-09-09 | Discharge: 2023-09-09 | Disposition: A | Discharge status: Home / Self Care | Payer: Medicare Other | Source: Ambulatory Visit | Attending: Student | Admitting: Student

## 2023-09-09 ENCOUNTER — Telehealth: Payer: Self-pay

## 2023-09-09 DIAGNOSIS — Z79899 Other long term (current) drug therapy: Secondary | ICD-10-CM | POA: Diagnosis not present

## 2023-09-09 LAB — CBC
HCT: 34.4 % — ABNORMAL LOW (ref 36.0–46.0)
Hemoglobin: 11.6 g/dL — ABNORMAL LOW (ref 12.0–15.0)
MCH: 33.7 pg (ref 26.0–34.0)
MCHC: 33.7 g/dL (ref 30.0–36.0)
MCV: 100 fL (ref 80.0–100.0)
Platelets: 331 10*3/uL (ref 150–400)
RBC: 3.44 MIL/uL — ABNORMAL LOW (ref 3.87–5.11)
RDW: 14.9 % (ref 11.5–15.5)
WBC: 11.4 10*3/uL — ABNORMAL HIGH (ref 4.0–10.5)
nRBC: 0 % (ref 0.0–0.2)

## 2023-09-09 LAB — COMPREHENSIVE METABOLIC PANEL
ALT: 15 U/L (ref 0–44)
AST: 16 U/L (ref 15–41)
Albumin: 3.5 g/dL (ref 3.5–5.0)
Alkaline Phosphatase: 52 U/L (ref 38–126)
Anion gap: 9 (ref 5–15)
BUN: 16 mg/dL (ref 8–23)
CO2: 23 mmol/L (ref 22–32)
Calcium: 9.7 mg/dL (ref 8.9–10.3)
Chloride: 102 mmol/L (ref 98–111)
Creatinine, Ser: 0.93 mg/dL (ref 0.44–1.00)
GFR, Estimated: 60 mL/min — ABNORMAL LOW (ref >60)
Glucose, Bld: 100 mg/dL — ABNORMAL HIGH (ref 70–99)
Potassium: 4 mmol/L (ref 3.5–5.1)
Sodium: 134 mmol/L — ABNORMAL LOW (ref 135–145)
Total Bilirubin: 1 mg/dL (ref 0.3–1.2)
Total Protein: 6.5 g/dL (ref 6.5–8.1)

## 2023-09-09 LAB — TSH: TSH: 0.705 u[IU]/mL (ref 0.350–4.500)

## 2023-09-09 NOTE — Telephone Encounter (Signed)
Per chart review, the patient was recommended to have a CBC, CMP, and TSH collected on 09/06/23. The patient presented to the office today to have the labs drawn, and orders have been placed

## 2023-09-13 LAB — TSH

## 2023-09-13 LAB — CBC

## 2023-09-13 LAB — COMPREHENSIVE METABOLIC PANEL

## 2023-09-20 ENCOUNTER — Ambulatory Visit (INDEPENDENT_AMBULATORY_CARE_PROVIDER_SITE_OTHER): Payer: Medicare Other | Admitting: Family Medicine

## 2023-09-20 ENCOUNTER — Encounter: Payer: Self-pay | Admitting: Family Medicine

## 2023-09-20 VITALS — BP 116/62 | HR 80 | Temp 98.1°F | Ht 64.0 in | Wt 174.4 lb

## 2023-09-20 DIAGNOSIS — E7800 Pure hypercholesterolemia, unspecified: Secondary | ICD-10-CM

## 2023-09-20 DIAGNOSIS — F419 Anxiety disorder, unspecified: Secondary | ICD-10-CM | POA: Insufficient documentation

## 2023-09-20 DIAGNOSIS — F430 Acute stress reaction: Secondary | ICD-10-CM

## 2023-09-20 DIAGNOSIS — R251 Tremor, unspecified: Secondary | ICD-10-CM | POA: Diagnosis not present

## 2023-09-20 MED ORDER — ATORVASTATIN CALCIUM 10 MG PO TABS: 10.0000 mg | ORAL_TABLET | Freq: Every day | ORAL | 3 refills | Status: DC

## 2023-09-20 MED ORDER — FLUOXETINE HCL 10 MG PO CAPS: 10.0000 mg | ORAL_CAPSULE | Freq: Every day | ORAL | 1 refills | Status: DC

## 2023-09-20 NOTE — Assessment & Plan Note (Signed)
Worsened lately  Personal health problems and family health - a lot of stress  Good insight  Make her feel jittery/cause tremor and at times tearful  Feels overwhelmed at times  Not depressed or hopeless In past GAD7 was normal , this is a recent change   Reviewed stressors/ coping techniques/symptoms/ support sources/ tx options and side effects in detail today Referral done for mental health counseling Discussed opt for treatment  Will start fluoxetine 10 mg daily  Discussed expectations of SSRI medication including time to effectiveness and mechanism of action, also poss of side effects (early and late)- including mental fuzziness, weight or appetite change, nausea and poss of worse dep or anxiety (even suicidal thoughts)  Pt voiced understanding and will stop med and update if this occurs   Follow up 4-5 weeks or earlier if needed   43  Minutes were spent today both face to face and in the chart obtaining history, reviewing records and test results, performing exam , educating and discussing treatment options, and placing orders

## 2023-09-20 NOTE — Assessment & Plan Note (Signed)
Suspect this and internal jitteriness are from anx mood  See a/p for that

## 2023-09-20 NOTE — Patient Instructions (Addendum)
Start fluoxetine 10 mg daily   If any intolerable side effects for if you feel worse let us know   Take care of yourself   Exercise is very important for anxiety   Follow up with me in 4-5 weeks    I put the referral in for mental health counselor  Please let us know if you don't hear in 1-2 weeks

## 2023-09-20 NOTE — Assessment & Plan Note (Signed)
Refilled atorvastatin to new pharmacy  Has been stable

## 2023-09-20 NOTE — Progress Notes (Signed)
Subjective:    Patient ID: Tammy Manning, female    DOB: 09-06-1935, 88 y.o.   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= feels tight all over / esp in her chest (did see cardiology)  Stress Her stress and other people's health  No interpersonal issues  Has had a lot of loss   Has good insight overall  Has good support       07/21/2023    3:52 PM 03/19/2023   11:37 AM 02/18/2023   10:48 AM 12/25/2022   12:37 PM 12/24/2021    1:26 PM  Depression screen PHQ 2/9  Decreased Interest 0 0 0 0 0  Down, Depressed, Hopeless 0 0 0 0 0  PHQ - 2 Score 0 0 0 0 0  Altered sleeping 0 0     Tired, decreased energy 0 2     Change in appetite 0 3     Feeling bad or failure about yourself  0 0     Trouble concentrating 0 1     Moving slowly or fidgety/restless 0 0     Suicidal thoughts 0 0     PHQ-9 Score 0 6     Difficult doing work/chores Not difficult at all Somewhat difficult         07/21/2023    3:52 PM 03/19/2023   11:37 AM 02/18/2023   10:48 AM  GAD 7 : Generalized Anxiety Score  Nervous, Anxious, on Edge 0 1 0  Control/stop worrying 0 0 0  Worry too much - different things 0 0 0  Trouble relaxing 0 0 0  Restless 0 0 0  Easily annoyed or irritable 0 0 0  Afraid - awful might happen 0  0  Total GAD 7 Score 0  0  Anxiety Difficulty Not difficult at all Somewhat  difficult Not difficult at all       Pt had cardiology visit 10/14 for dizziness in setting of chronic diastolic HF and a fib  Had covid previously which left her with some shortness of breath/ chest tightness and fatigue  Orthostatics were normal and labs were done   Feeling better now    HTN BP Readings from Last 3 Encounters:  09/20/23 116/62  09/06/23 (!) 118/52  08/02/23 128/64     Ibesartan 300 mg daily  Cartia XT 180 mg     Pulse Readings from Last 3 Encounters:  09/20/23 80  09/06/23 83  08/02/23 93   Eliquis for a fib   She stopped methotrexate for RA- felt like did not need it    Lab Results  Component Value Date   NA 134 (L) 09/09/2023   K 4.0 09/09/2023  CO2 23 09/09/2023   GLUCOSE 100 (H) 09/09/2023   BUN 16 09/09/2023   CREATININE 0.93 09/09/2023   CALCIUM 9.7 09/09/2023   GFR 79.38 03/19/2023   EGFR 56 (L) 07/07/2023   GFRNONAA 60 (L) 09/09/2023   Lab Results  Component Value Date   ALT 15 09/09/2023   Manning 16 09/09/2023   ALKPHOS 52 09/09/2023   BILITOT 1.0 09/09/2023   Has history of past thyroid nodule  Lab Results  Component Value Date   TSH 0.705 09/09/2023   Lab Results  Component Value Date   WBC 11.4 (H) 09/09/2023   HGB 11.6 (L) 09/09/2023   HCT 34.4 (L) 09/09/2023   MCV 100.0 09/09/2023   PLT 331 09/09/2023  Of note -was on prednisone in sept   B12 def-gets shots every 30 days  Lab Results  Component Value Date   VITAMINB12 333 01/27/2023   Mood  Takes tranxene 7.5 mg as needed    Patient Active Problem List   Diagnosis Date Noted   Anxious mood 09/20/2023   Tremor of both hands 09/20/2023   Viral URI with cough 07/21/2023   Abrasion of left arm 04/28/2023   Head injury 04/28/2023   Superficial injury of skin 04/21/2023   Dysuria 04/12/2023   Diverticulosis 03/21/2023   Acute colitis 03/13/2023   Chronic diastolic CHF (congestive heart failure) (HCC) 03/13/2023   Obesity (BMI 30-39.9) 03/13/2023   PAF  (paroxysmal atrial fibrillation) (HCC) 03/13/2023   Recurrent UTI (urinary tract infection) 02/18/2023   Intertrigo 02/16/2023   Hemorrhoids 02/02/2023   Urinary incontinence 02/02/2023   Acute cystitis 01/26/2023   Dilated aortic root (HCC) 01/26/2023   Osteopenia 01/26/2023   Multinodular goiter 02/27/2022   Encounter for screening mammogram for breast cancer 01/23/2022   High risk medication use 10/07/2021   Atrial fibrillation with RVR (HCC) 04/28/2021   Hypertrophic toenail 04/22/2021   Seropositive rheumatoid arthritis (HCC) 11/25/2020   Bilateral hand pain 11/25/2020   Bilateral shoulder pain 10/16/2020   Joint pain 10/16/2020   Estrogen deficiency 04/30/2020   Dry mouth 03/15/2020   Osteoarthritis of right hip 01/02/2020   Right thyroid nodule 05/12/2018   History of TIA (transient ischemic attack) 05/09/2018   Aortic insufficiency 09/10/2015   Stress reaction 09/08/2015   Encounter for Medicare annual wellness exam 02/28/2013   Bucket-handle tear of lateral meniscus of left knee as current injury 12/30/2012   Episodic atrial fibrillation (HCC) 11/17/2012   Hearing loss of both ears 01/14/2012   B12 deficiency 03/17/2010   GERD (gastroesophageal reflux disease) 03/17/2010   POSTMENOPAUSAL STATUS 08/20/2008   Essential hypertension 07/19/2007   Hyperlipidemia 06/24/2007   IBS 06/24/2007   DERMATITIS, ATOPIC 06/24/2007   SKIN CANCER, HX OF 06/24/2007   Past Medical History:  Diagnosis Date   A-fib (HCC)    Anginal pain (HCC)    Aortic insufficiency    a. 02/2020 Echo: EF 60-65%, no rwma, Gr1 DD, nl RV fxn, mildly dil LA, triv AI, Asc Ao 39mm; b. 04/2021 Echo: EF 60-65%, no rwma, GrI DD, nl RV fxn, mildly dil LA, AI not visualized. Mild-mod AoV sclerosis w/o stenosis.   Arthritis    Knees   B12 deficiency    Mild   Bucket-handle tear of lateral meniscus of left knee as current injury 12/30/2012   Cancer (HCC)    Skin, basal cell on nose and eyelid   Colitis     DJD (degenerative joint disease)    Low back   Dyspepsia  GERD (gastroesophageal reflux disease)    Heart murmur    Heart palpitations    History of cardiac cath    a. 02/2017 Cath: Nl cors. Nl LV fxn. Abd Aogram w/o RAS.   HOH (hard of hearing)    Hyperlipidemia    Hypertension    IBS (irritable bowel syndrome)    Lactose intolerance    Mixed incontinence    PONV (postoperative nausea and vomiting)    Urinary tract infection    Vulvar irritation    from urine pad, uses Triamcinolone oint.   Past Surgical History:  Procedure Laterality Date   ABDOMINAL AORTOGRAM N/A 03/15/2017   Procedure: Abdominal Aortogram;  Surgeon: Iran Ouch, MD;  Location: [facility];  Service: Cardiovascular;  Laterality: N/A;   ABDOMINAL HYSTERECTOMY  1982   Large fibroids and bleeding   APPENDECTOMY     BILATERAL SALPINGOOPHORECTOMY  1986   BONE GRAFT HIP ILIAC CREST     To Left arm   BREAST REDUCTION SURGERY     BREAST SURGERY     CARDIAC CATHETERIZATION     CATARACT EXTRACTION W/PHACO Left 07/07/2016   Procedure: CATARACT EXTRACTION PHACO AND INTRAOCULAR LENS PLACEMENT (IOC);  Surgeon: Galen Manila, MD;  Location: [facility];  Service: Ophthalmology;  Laterality: Left;  [place] 01:10 AP% 18.3 CDE 12.91 Fluid pak lot # [id number]   CATARACT EXTRACTION W/PHACO Right 07/28/2016   Procedure: CATARACT EXTRACTION PHACO AND INTRAOCULAR LENS PLACEMENT (IOC);  Surgeon: Galen Manila, MD;  Location: [facility];  Service: Ophthalmology;  Laterality: Right;  [place] 01:26 AP% 18.5 CDE 16.12 Fluid pack lot # [id number]   COLONOSCOPY  07/2016   Dr. Lottie Mussel   COLONOSCOPY WITH PROPOFOL N/A 04/01/2023   Procedure: COLONOSCOPY WITH PROPOFOL;  Surgeon: Toney Reil, MD;  Location: [facility];  Service: Gastroenterology;  Laterality: N/A;   DILATION AND CURETTAGE OF UTERUS     miscarragesx2   ESOPHAGOGASTRODUODENOSCOPY     Polyps   HEMORRHOID SURGERY  2006   KNEE ARTHROSCOPY WITH LATERAL  MENISECTOMY Left 12/30/2012   Procedure: KNEE ARTHROSCOPY WITH LATERAL MENISECTOMY;  Surgeon: Eulas Post, MD;  Location: [facility];  Service: Orthopedics;  Laterality: Left;  LEFT KNEE SCOPE LATERAL MENISCECTOMY   LEFT HEART CATH AND CORONARY ANGIOGRAPHY N/A 03/15/2017   Procedure: Left Heart Cath and Coronary Angiography;  Surgeon: Iran Ouch, MD;  Location: [facility];  Service: Cardiovascular;  Laterality: N/A;   MOUTH SURGERY     skin cancer eyelid  2012   TONSILLECTOMY     Social History   Tobacco Use   Smoking status: Former    Current packs/day: 0.00    Average packs/day: 1 pack/day for 12.0 years (12.0 ttl pk-yrs)    Types: Cigarettes    Start date: 11/24/1951    Quit date: 11/24/1963    Years since quitting: 59.8    Passive exposure: Never   Smokeless tobacco: Never  Vaping Use   Vaping status: Never Used  Substance Use Topics   Alcohol use: No    Alcohol/week: 0.0 standard drinks of alcohol   Drug use: No   Family History  Problem Relation Age of Onset   Cancer Mother        Colon   Heart disease Father        CAD   Diabetes Sister    Hypothyroidism Sister    Diabetes Sister    Alzheimer's disease Sister    Diabetes Brother  Leukemia Brother    Esophageal cancer Brother    Diabetes Brother    Cancer Paternal Aunt        Breast   Breast cancer Daughter    Allergies  Allergen Reactions   Antihistamines, Chlorpheniramine-Type Other (See Comments)    Reaction:  Makes pt hyper    Atenolol Hypertension   Cefuroxime Axetil Other (See Comments)    Upset stomach   Ciprofloxacin Other (See Comments)    Upset stomach   Co Q 10 [Coenzyme Q10] Other (See Comments)    Reaction:  GI upset    Crestor [Rosuvastatin Calcium] Other (See Comments)    Reaction:  Myalgias    Dextromethorphan-Guaifenesin Other (See Comments)    Reaction:  Made pt jittery    Elavil [Amitriptyline]     Felt worse /tearful    Epinephrine Hives    Methenamine     Tongue discomfort    Ramipril Other (See Comments)    Upset stomach   Sulfamethoxazole-Trimethoprim Other (See Comments)    Upset stomach   Vitamin D Analogs Other (See Comments)    Reaction:  GI upset    Current Outpatient Medications on File Prior to Visit  Medication Sig Dispense Refill   Calcium Carbonate-Vitamin D (CALCIUM 600+D PO) Take 1 tablet by mouth daily.      CARTIA XT 180 MG 24 hr capsule TAKE 1 CAPSULE(180 MG) BY MOUTH DAILY 90 capsule 3   clobetasol ointment (TEMOVATE) 0.05 % Apply 1 application topically 2 (two) times daily as needed.     clorazepate (TRANXENE) 7.5 MG tablet Take 1 tablet (7.5 mg total) by mouth daily as needed. 30 tablet 0   cyanocobalamin (,VITAMIN B-12,) 1000 MCG/ML injection Inject 1,000 mcg into the muscle every 30 (thirty) days.      ELIQUIS 5 MG TABS tablet TAKE 1 TABLET BY MOUTH TWICE DAILY 180 tablet 1   estradiol (ESTRACE) 0.1 MG/GM vaginal cream Apply a pea-sized amount to fingertip and wipe in vaginal introitus twice weekly 42.5 g 1   famotidine (PEPCID) 20 MG tablet Take 20 mg by mouth 2 (two) times daily as needed for heartburn or indigestion.     guaiFENesin (MUCINEX) 600 MG 12 hr tablet Take 600 mg by mouth 2 (two) times daily as needed.     hydrocortisone (ANUSOL-HC) 2.5 % rectal cream Apply 1 Application topically at bedtime. To affected area for 10 days 30 g 0   hydrocortisone (ANUSOL-HC) 25 MG suppository Place 1 suppository (25 mg total) rectally 2 (two) times daily. 10 suppository 0   irbesartan (AVAPRO) 300 MG tablet TAKE 1 TABLET BY MOUTH DAILY (Patient taking differently: Take 300 mg by mouth daily.) 90 tablet 2   loperamide (IMODIUM) 2 MG capsule Take 2 mg by mouth as needed for diarrhea or loose stools.     Na Sulfate-K Sulfate-Mg Sulf 17.5-3.13-1.6 GM/177ML SOLN See admin instructions.     triamcinolone (NASACORT) 55 MCG/ACT AERO nasal inhaler Place 2 sprays into the nose daily. 1 each 12   trimethoprim (TRIMPEX)  100 MG tablet Take 1 tablet (100 mg total) by mouth daily. 30 tablet 2   Tuberculin-Allergy Syringes 27G X 1/2" 1 ML MISC Use 1 syringe once weekly to inject methotrexate. 25 each 1   No current facility-administered medications on file prior to visit.    Review of Systems  Constitutional:  Positive for fatigue. Negative for activity change, appetite change, fever and unexpected weight change.  HENT:  Negative for congestion, ear pain,  rhinorrhea, sinus pressure and sore throat.   Eyes:  Negative for pain, redness and visual disturbance.  Respiratory:  Negative for cough, shortness of breath and wheezing.   Cardiovascular:  Negative for chest pain and palpitations.  Gastrointestinal:  Negative for abdominal pain, blood in stool, constipation and diarrhea.  Endocrine: Negative for polydipsia and polyuria.  Genitourinary:  Negative for dysuria, frequency and urgency.  Musculoskeletal:  Negative for arthralgias, back pain and myalgias.  Skin:  Negative for pallor and rash.  Allergic/Immunologic: Negative for environmental allergies.  Neurological:  Positive for tremors and speech difficulty. Negative for dizziness, syncope, weakness, light-headedness, numbness and headaches.       Shaky when feeling anxious  In hands only  Hematological:  Negative for adenopathy. Does not bruise/bleed easily.  Psychiatric/Behavioral:  Negative for decreased concentration and dysphoric mood. The patient is nervous/anxious.        Objective:   Physical Exam Constitutional:      General: She is not in acute distress.    Appearance: Normal appearance. She is well-developed. She is obese. She is not ill-appearing or diaphoretic.  HENT:     Head: Normocephalic and atraumatic.  Eyes:     Conjunctiva/sclera: Conjunctivae normal.     Pupils: Pupils are equal, round, and reactive to light.  Neck:     Thyroid: No thyromegaly.     Vascular: No carotid bruit or JVD.  Cardiovascular:     Rate and Rhythm:  Normal rate and regular rhythm.     Heart sounds: Normal heart sounds.     No gallop.  Pulmonary:     Effort: Pulmonary effort is normal. No respiratory distress.     Breath sounds: Normal breath sounds. No wheezing or rales.  Abdominal:     General: There is no distension or abdominal bruit.     Palpations: Abdomen is soft.  Musculoskeletal:     Cervical back: Normal range of motion and neck supple.     Right lower leg: No edema.     Left lower leg: No edema.  Lymphadenopathy:     Cervical: No cervical adenopathy.  Skin:    General: Skin is warm and dry.     Coloration: Skin is not pale.     Findings: No rash.  Neurological:     Mental Status: She is alert.     Coordination: Coordination normal.     Deep Tendon Reflexes: Reflexes are normal and symmetric. Reflexes normal.     Comments: Mild /fine hand tremor of intention  No other tremor noted No bradykinesia or cogwheel rigidity   Psychiatric:        Mood and Affect: Mood normal.           Assessment & Plan:   Problem List Items Addressed This Visit       Other   Anxious mood - Primary    Worsened lately  Personal health problems and family health - a lot of stress  Good insight  Make her feel jittery/cause tremor and at times tearful  Feels overwhelmed at times  Not depressed or hopeless In past GAD7 was normal , this is a recent change   Reviewed stressors/ coping techniques/symptoms/ support sources/ tx options and side effects in detail today Referral done for mental health counseling Discussed opt for treatment  Will start fluoxetine 10 mg daily  Discussed expectations of SSRI medication including time to effectiveness and mechanism of action, also poss of side effects (early and late)- including mental  fuzziness, weight or appetite change, nausea and poss of worse dep or anxiety (even suicidal thoughts)  Pt voiced understanding and will stop med and update if this occurs   Follow up 4-5 weeks or earlier  if needed   43  Minutes were spent today both face to face and in the chart obtaining history, reviewing records and test results, performing exam , educating and discussing treatment options, and placing orders              Relevant Medications   FLUoxetine (PROZAC) 10 MG capsule   Other Relevant Orders   Ambulatory referral to Psychology   Hyperlipidemia    Refilled atorvastatin to new pharmacy  Has been stable      Relevant Medications   atorvastatin (LIPITOR) 10 MG tablet   Stress reaction    See a/p for anxious mood  Counseling referral Trial of low dose fluoxetine       Relevant Medications   FLUoxetine (PROZAC) 10 MG capsule   Other Relevant Orders   Ambulatory referral to Psychology   Tremor of both hands    Suspect this and internal jitteriness are from anx mood  See a/p for that

## 2023-09-20 NOTE — Assessment & Plan Note (Signed)
See a/p for anxious mood  Counseling referral Trial of low dose fluoxetine

## 2023-09-21 ENCOUNTER — Encounter: Payer: Self-pay | Admitting: Family Medicine

## 2023-09-21 DIAGNOSIS — E7800 Pure hypercholesterolemia, unspecified: Secondary | ICD-10-CM

## 2023-09-21 MED ORDER — FLUOXETINE HCL 10 MG PO CAPS: 10.0000 mg | ORAL_CAPSULE | Freq: Every day | ORAL | 1 refills | Status: DC

## 2023-09-21 MED ORDER — ATORVASTATIN CALCIUM 10 MG PO TABS: 10.0000 mg | ORAL_TABLET | Freq: Every day | ORAL | 3 refills | Status: DC

## 2023-09-22 DIAGNOSIS — M170 Bilateral primary osteoarthritis of knee: Secondary | ICD-10-CM | POA: Diagnosis not present

## 2023-09-24 NOTE — Progress Notes (Signed)
Office Visit Note  Patient: Tammy Manning             Date of Birth: 1935-10-20           MRN: [id number]             PCP: [name], MD Referring: [name], Audrie Gallus, MD Visit Date: 10/08/2023   Subjective:  Follow-up  Discussed the use of AI scribe software for clinical note transcription with the patient, who gave verbal consent to proceed.  History of Present Illness   The patient, an 88 year old with a history of chronic urinary tract infections (UTIs), rheumatoid arthritis, and atrial fibrillation, presents for follow-up for seropositive RA on methotrexate 15 mg subcu weekly. They report a recent bout of COVID-19, which was slow to resolve and required them to temporarily discontinue methotrexate. During this period, they experienced a resurgence of joint pain, particularly in the shoulder. Upon resuming methotrexate, the patient reports significant improvement in their symptoms.  She remains on indefinite antibiotics for chronic UTI. They are currently on a daily low-dose antibiotic regimen for maintenance.  The patient also mentions some joint pain in the right hand and shoulder, which was more pronounced when they were off methotrexate during their COVID-19 illness. They report minimal morning stiffness and are able to maintain their daily routine, including walking their dog.  She had recent repeat knee injections with her orthopedic clinic for chronic osteoarthritis pain.  Recent CBC and CMP checked in October WBC mildly elevated 11.4 metabolic panel was normal.  Previous HPI 07/07/2023 Tammy Manning is a 88 y.o. female here for follow up for seropositive RA on methotrexate 15 mg subcu weekly.  Joint symptoms have been pretty stable without serious exacerbation or swelling. She has pain in both hips and had a steroid injection on the right side about one month ago with a good improvement. Left side is bothering her currently but not as badly as sometimes. Noticeable both  at night and when walking.  Has ongoing problems since the colitis with constipation sometimes for a week and then abdominal pain and diarrhea. Also with chronic UTI with frequency and incontinence but no painful urination or visible abnormality.   Previous HPI 03/30/2023 Tammy Manning is a 88 y.o. female here for follow up for seropositive RA on methotrexate 15 mg subcu weekly.  Not on folic acid due to intolerance of oral supplement.  Overall arthritis symptoms are doing well.  She had recent bilateral knee steroid injections with Dr. Dion Saucier with good improvement.  Currently no active complaint about the neck shoulder or hand pain.  She had an episode of colitis no specific causative infection or inflammatory process identified.  This has resolved but is now scheduled for colonoscopy on Thursday.     Previous HPI 12/30/22 Tammy Manning is a 88 y.o. female here for follow up for seropositive RA on methotrexate 15 mg subcu weekly and folic acid 1 mg daily.  Since her last visit she still had some ongoing pain most persistently in the neck and shoulders bilaterally but without any severe flareup compared to before starting treatment.  She has been experiencing more pain affecting her left knee where there is known severe osteoarthritis.  She started taking a lot of Tylenol about 3 times every day 3 to 4 weeks ago but started to develop right-sided flank pain and diarrhea with yellow discolored stools.  She decreased to taking Tylenol no more than once daily with improvement of the symptoms.  She  saw Dr. Dion Saucier with intra-articular steroid injection 2 weeks ago with a good improvement in symptoms.  She has not had any significant interval infections.     Previous HPI 03/09/22 Tammy Manning is a 88 y.o. female here for follow up for seropositive RA on MTX 15 mg [place] weekly. She is doing very well since last visit joint pains almost all better except bilateral hips bothering her intermittently. Left  knee also creaky but not much inflammation problem. She has persistent stomach irritation, not much relief with taking pepcid as needed. [facility] not able to fill methot[facility]ate Rx from supply issue but she still had medication on hand. She saw urology recently with persistent abnormal UA and cultures but mostly asymptomatic.   Previous HPI 12/29/21 Tammy Manning is a 88 y.o. female here for follow up for RA after restarting methotrexate at 15 mg [place] weekly, which had been delayed due to repeated interruptions from UTI infections and antibiotic treatment. She has noticed improvement in joint symptoms after about 2 weeks taking the methotrexate. She continues to have some nausea when she takes this despite subcutaneous route.   Previous HPI 11/25/20 Tammy Manning is a 88 y.o. female with a history of afib, TIA, and OA here for evaluation of joint pain and positive rheumatoid factor. She has joint pain in multiple sites including both hands but also has a lot of pain with the right shoulder that started more acutely with suspected rotator cuff tear. 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 She does notice swelling in hands intermittently. She has morning stiffness lasting less than 30 minutes daily also persistent pain throughout the day. She has had left radius fracture with surgical repair but no other major surgery of her arms.   Review of Systems  Constitutional:  Positive for fatigue.  HENT:  Positive for mouth dryness. Negative for mouth sores.   Eyes:  Positive for dryness.  Respiratory:  Negative for shortness of breath.   Cardiovascular:  Negative for chest pain and palpitations.  Gastrointestinal:  Positive for constipation and diarrhea. Negative for blood in stool.  Endocrine: Positive for increased urination.  Genitourinary:  Positive for involuntary urination.  Musculoskeletal:  Positive for gait  problem, muscle weakness and morning stiffness. Negative for joint pain, joint pain, joint swelling, myalgias, muscle tenderness and myalgias.  Skin:  Positive for hair loss. Negative for color change, rash and sensitivity to sunlight.  Allergic/Immunologic: Positive for susceptible to infections.  Neurological:  Negative for dizziness and headaches.  Hematological:  Negative for swollen glands.  Psychiatric/Behavioral:  Negative for depressed mood and sleep disturbance. The patient is nervous/anxious.     PMFS History:  Patient Active Problem List   Diagnosis Date Noted   Anxious mood 09/20/2023   Tremor of both hands 09/20/2023   Viral URI with cough 07/21/2023   Abrasion of left arm 04/28/2023   Head injury 04/28/2023   Superficial injury of skin 04/21/2023   Dysuria 04/12/2023   Diverticulosis 03/21/2023   Acute colitis 03/13/2023   Chronic diastolic CHF (congestive heart failure) (HCC) 03/13/2023   Obesity (BMI 30-39.9) 03/13/2023   PAF (paroxysmal atrial fibrillation) (HCC) 03/13/2023   Recurrent UTI (urinary tract infection) 02/18/2023   Intertrigo 02/16/2023   Hemorrhoids 02/02/2023   Urinary incontinence 02/02/2023   Acute cystitis 01/26/2023   Dilated aortic root (HCC) 01/26/2023   Osteopenia 01/26/2023   Multinodular goiter 02/27/2022   Encounter  for screening mammogram for breast cancer 01/23/2022   High risk medication use 10/07/2021   Atrial fibrillation with RVR (HCC) 04/28/2021   Hypertrophic toenail 04/22/2021   Seropositive rheumatoid arthritis (HCC) 11/25/2020   Bilateral hand pain 11/25/2020   Bilateral shoulder pain 10/16/2020   Joint pain 10/16/2020   Estrogen deficiency 04/30/2020   Dry mouth 03/15/2020   Osteoarthritis of right hip 01/02/2020   Right thyroid nodule 05/12/2018   History of TIA (transient ischemic attack) 05/09/2018   Aortic insufficiency 09/10/2015   Stress reaction 09/08/2015   Encounter for Medicare annual wellness exam  02/28/2013   Bucket-handle tear of lateral meniscus of left knee as current injury 12/30/2012   Episodic atrial fibrillation (HCC) 11/17/2012   Hearing loss of both ears 01/14/2012   B12 deficiency 03/17/2010   GERD (gastroesophageal reflux disease) 03/17/2010   POSTMENOPAUSAL STATUS 08/20/2008   Essential hypertension 07/19/2007   Hyperlipidemia 06/24/2007   IBS 06/24/2007   DERMATITIS, ATOPIC 06/24/2007   SKIN CANCER, HX OF 06/24/2007    Past Medical History:  Diagnosis Date   A-fib (HCC)    Anginal pain (HCC)    Aortic insufficiency    a. 02/2020 Echo: EF 60-65%, no rwma, Gr1 DD, nl RV fxn, mildly dil LA, triv AI, Asc Ao 39mm; b. 04/2021 Echo: EF 60-65%, no rwma, GrI DD, nl RV fxn, mildly dil LA, AI not visualized. Mild-mod AoV sclerosis w/o stenosis.   Arthritis    Knees   B12 deficiency    Mild   Bucket-handle tear of lateral meniscus of left knee as current injury 12/30/2012   Cancer (HCC)    Skin, basal cell on nose and eyelid   Colitis    COVID    DJD (degenerative joint disease)    Low back   Dyspepsia    GERD (gastroesophageal reflux disease)    Heart murmur    Heart palpitations    History of cardiac cath    a. 02/2017 Cath: Nl cors. Nl LV fxn. Abd Aogram w/o RAS.   HOH (hard of hearing)    Hyperlipidemia    Hypertension    IBS (irritable bowel syndrome)    Lactose intolerance    Mixed incontinence    PONV (postoperative nausea and vomiting)    Urinary tract infection    Vulvar irritation    from urine pad, uses Triamcinolone oint.    Family History  Problem Relation Age of Onset   Cancer Mother        Colon   Heart disease Father        CAD   Diabetes Sister    Hypothyroidism Sister    Diabetes Sister    Alzheimer's disease Sister    Diabetes Brother    Leukemia Brother    Esophageal cancer Brother    Diabetes Brother    Cancer Paternal Aunt        Breast   Breast cancer Daughter    Past Surgical History:  Procedure Laterality Date    ABDOMINAL AORTOGRAM N/A 03/15/2017   Procedure: Abdominal Aortogram;  Surgeon: Iran Ouch, MD;  Location: [facility];  Service: Cardiovascular;  Laterality: N/A;   ABDOMINAL HYSTERECTOMY  1982   Large fibroids and bleeding   APPENDECTOMY     BILATERAL SALPINGOOPHORECTOMY  1986   BONE GRAFT HIP ILIAC CREST     To Left arm   BREAST REDUCTION SURGERY     BREAST SURGERY     CARDIAC CATHETERIZATION  CATARACT EXTRACTION W/PHACO Left 07/07/2016   Procedure: CATARACT EXTRACTION PHACO AND INTRAOCULAR LENS PLACEMENT (IOC);  Surgeon: Galen Manila, MD;  Location: [facility] ORS;  Service: Ophthalmology;  Laterality: Left;  [place] 01:10 AP% 18.3 CDE 12.91 Fluid pak lot # [id number]   CATARACT EXTRACTION W/PHACO Right 07/28/2016   Procedure: CATARACT EXTRACTION PHACO AND INTRAOCULAR LENS PLACEMENT (IOC);  Surgeon: Galen Manila, MD;  Location: [facility];  Service: Ophthalmology;  Laterality: Right;  [place] 01:26 AP% 18.5 CDE 16.12 Fluid pack lot # [id number]   COLONOSCOPY  07/2016   Dr. Lottie Mussel   COLONOSCOPY WITH PROPOFOL N/A 04/01/2023   Procedure: COLONOSCOPY WITH PROPOFOL;  Surgeon: Toney Reil, MD;  Location[facility];  Service: Gastroenterology;  Laterality: N/A;   DILATION AND CURETTAGE OF UTERUS     miscarragesx2   ESOPHAGOGASTRODUODENOSCOPY     Polyps   HEMORRHOID SURGERY  2006   KNEE ARTHROSCOPY WITH LATERAL MENISECTOMY Left 12/30/2012   Procedure: KNEE ARTHROSCOPY WITH LATERAL MENISECTOMY;  Surgeon: Eulas Post, MD;  Location: [facility];  Service: Orthopedics;  Laterality: Left;  LEFT KNEE SCOPE LATERAL MENISCECTOMY   LEFT HEART CATH AND CORONARY ANGIOGRAPHY N/A 03/15/2017   Procedure: Left Heart Cath and Coronary Angiography;  Surgeon: Iran Ouch, MD;  Location: [facility];  Service: Cardiovascular;  Laterality: N/A;   MOUTH SURGERY     skin cancer eyelid  2012   TONSILLECTOMY     Social History   Social History Narrative   Psychologist, prison and probation services.     Immunization History  Administered Date(s) Administered   Fluad Quad(high Dose 65+) 10/16/2020, 07/24/2022   Influenza Split 07/24/2011, 08/23/2014   Influenza Whole 08/23/2004, 07/24/2009, 08/06/2010, 08/05/2012   Influenza, High Dose Seasonal PF 08/11/2016, 08/27/2017, 08/02/2018, 09/05/2021, 08/31/2023   Influenza,inj,Quad PF,6+ Mos 08/16/2013, 08/07/2015   Influenza-Unspecified 09/05/2021   PFIZER(Purple Top)SARS-COV-2 Vaccination 12/15/2019, 01/05/2020, 10/25/2020   Pneumococcal Conjugate-13 08/24/2014   Pneumococcal Polysaccharide-23 08/20/2008   Respiratory Syncytial Virus Vaccine,Recomb Aduvanted(Arexvy) 12/30/2022   Td 03/20/2003, 02/28/2013, 04/21/2023   Zoster, Live 09/04/2015     Objective: Vital Signs: BP 124/71 (BP Location: Left Arm, Patient Position: Sitting, Cuff Size: Normal)   Pulse 69   Resp 14   Ht 5\' 4"  (1.626 m)   Wt 175 lb (79.4 kg)   BMI 30.04 kg/m    Physical Exam Eyes:     Conjunctiva/sclera: Conjunctivae normal.  Cardiovascular:     Rate and Rhythm: Normal rate and regular rhythm.     Heart sounds: Murmur heard.  Pulmonary:     Effort: Pulmonary effort is normal.     Breath sounds: Normal breath sounds.  Musculoskeletal:     Right lower leg: No edema.     Left lower leg: No edema.  Lymphadenopathy:     Cervical: No cervical adenopathy.  Skin:    General: Skin is warm and dry.     Findings: Bruising present.  Neurological:     Mental Status: She is alert.  Psychiatric:        Mood and Affect: Mood normal.      Musculoskeletal Exam:  Right shoulder painful with overhead abduction and full external rotation, tenderness to pressure more on anterior side no palpable swelling Elbows full ROM no tenderness or swelling Wrists full ROM no tenderness or swelling Fingers full ROM no tenderness or swelling Knees bilateral patellofemoral crepitus, no significant joint line tenderness to pressure or palpable effusions   CDAI  Exam: CDAI Score: -- Patient  Global: --; Provider Global: -- Swollen: --; Tender: -- Joint Exam 10/08/2023   No joint exam has been documented for this visit   There is currently no information documented on the homunculus. Go to the Rheumatology activity and complete the homunculus joint exam.  Investigation: No additional findings.  Imaging: ECHOCARDIOGRAM COMPLETE  Result Date: 09/27/2023    ECHOCARDIOGRAM REPORT   Patient Name:   MARIVI DURRENCE Date of Exam: 09/27/2023 Medical Rec #:  [id number]        Height:       64.0 in Accession #:    [id number]       Weight:       174.4 lb Date of Birth:  Sep 26, 1935         BSA:          1.846 m Patient Age:    88 years         BP:           118/52 mmHg Patient Gender: F                HR:           76 bpm. Exam Location:  [place] Procedure: 2D Echo, Strain Analysis, Color Doppler and Cardiac Doppler Indications:    I50.30* Unspecified diastolic (congestive) heart failure  History:        Patient has prior history of Echocardiogram examinations, most                 recent 04/29/2021. TIA, Arrythmias:Atrial Fibrillation and                 Tachycardia, Signs/Symptoms:Chest Pain, Murmur and                 Dizziness/Lightheadedness; Risk Factors:Hypertension and                 Dyslipidemia.  Sonographer:    Ilda Mori MHA, BS, RDCS Referring Phys: [id number] [facility] [place] IMPRESSIONS  1. Left ventricular ejection fraction, by estimation, is 60 to 65%. The left ventricle has normal function. The left ventricle has no regional wall motion abnormalities. There is mild left ventricular hypertrophy. Left ventricular diastolic parameters are consistent with Grade I diastolic dysfunction (impaired relaxation). The average left ventricular global longitudinal strain is -20.8 %. The global longitudinal strain is normal.  2. Right ventricular systolic function is normal. The right ventricular size is normal.  3. The mitral valve is degenerative. Mild mitral  valve regurgitation. Moderate mitral annular calcification.  4. The aortic valve is calcified. Aortic valve regurgitation is mild. Aortic valve sclerosis/calcification is present, without any evidence of aortic stenosis. Aortic valve area, by VTI measures 2.57 cm. Aortic valve mean gradient measures 7.0 mmHg. Aortic valve Vmax measures 1.75 m/s.  5. The inferior vena cava is normal in size with greater than 50% respiratory variability, suggesting right atrial pressure of 3 mmHg. FINDINGS  Left Ventricle: Left ventricular ejection fraction, by estimation, is 60 to 65%. The left ventricle has normal function. The left ventricle has no regional wall motion abnormalities. The average left ventricular global longitudinal strain is -20.8 %. The global longitudinal strain is normal. The left ventricular internal cavity size was normal in size. There is mild left ventricular hypertrophy. Left ventricular diastolic parameters are consistent with Grade I diastolic dysfunction (impaired relaxation). Right Ventricle: The right ventricular size is normal. No increase in right ventricular wall thickness. Right ventricular systolic function is normal. Left Atrium: Left atrial size was normal in  size. Right Atrium: Right atrial size was normal in size. Pericardium: There is no evidence of pericardial effusion. Mitral Valve: The mitral valve is degenerative in appearance. Moderate mitral annular calcification. Mild mitral valve regurgitation. Tricuspid Valve: The tricuspid valve is grossly normal. Tricuspid valve regurgitation is trivial. Aortic Valve: The aortic valve is calcified. Aortic valve regurgitation is mild. Aortic valve sclerosis/calcification is present, without any evidence of aortic stenosis. Aortic valve mean gradient measures 7.0 mmHg. Aortic valve peak gradient measures 12.2 mmHg. Aortic valve area, by VTI measures 2.57 cm. Pulmonic Valve: The pulmonic valve was normal in structure. Pulmonic valve regurgitation is  mild to moderate. Aorta: The aortic root and ascending aorta are structurally normal, with no evidence of dilitation. Venous: The inferior vena cava is normal in size with greater than 50% respiratory variability, suggesting right atrial pressure of 3 mmHg. IAS/Shunts: No atrial level shunt detected by color flow Doppler.  LEFT VENTRICLE PLAX 2D LVIDd:         3.90 cm   Diastology LVIDs:         2.70 cm   LV e' medial:    8.27 cm/s LV PW:         1.10 cm   LV E/e' medial:  10.8 LV IVS:        1.10 cm   LV e' lateral:   6.53 cm/s LVOT diam:     2.00 cm   LV E/e' lateral: 13.7 LV SV:         90 LV SV Index:   49        2D Longitudinal Strain LVOT Area:     3.14 cm  2D Strain GLS Avg:     -20.8 %  RIGHT VENTRICLE RV Basal diam:  2.90 cm RV Mid diam:    2.60 cm RV S prime:     18.30 cm/s TAPSE (M-mode): 1.7 cm LEFT ATRIUM             Index        RIGHT ATRIUM           Index LA diam:        4.10 cm 2.22 cm/m   RA Area:     13.20 cm LA Vol (A2C):   67.0 ml 36.30 ml/m  RA Volume:   26.20 ml  14.20 ml/m LA Vol (A4C):   57.4 ml 31.10 ml/m LA Biplane Vol: 62.8 ml 34.03 ml/m  AORTIC VALVE AV Area (Vmax):    2.33 cm AV Area (Vmean):   2.40 cm AV Area (VTI):     2.57 cm AV Vmax:           175.00 cm/s AV Vmean:          118.000 cm/s AV VTI:            0.352 m AV Peak Grad:      12.2 mmHg AV Mean Grad:      7.0 mmHg LVOT Vmax:         130.00 cm/s LVOT Vmean:        90.100 cm/s LVOT VTI:          0.288 m LVOT/AV VTI ratio: 0.82  AORTA Ao Root diam: 2.80 cm Ao Asc diam:  3.40 cm MITRAL VALVE MV Area (PHT): 2.95 cm     SHUNTS MV Decel Time: 257 msec     Systemic VTI:  0.29 m MV E velocity: 89.60 cm/s   Systemic Diam: 2.00 cm MV A  velocity: 136.00 cm/s MV E/A ratio:  0.66 Debbe Odea MD Electronically signed by Debbe Odea MD Signature Date/Time: 09/27/2023/1:43:50 PM    Final     Recent Labs: Lab Results  Component Value Date   WBC 11.4 (H) 09/09/2023   HGB 11.6 (L) 09/09/2023   PLT 331 09/09/2023   NA  134 (L) 09/09/2023   K 4.0 09/09/2023   CL 102 09/09/2023   CO2 23 09/09/2023   GLUCOSE 100 (H) 09/09/2023   BUN 16 09/09/2023   CREATININE 0.93 09/09/2023   BILITOT 1.0 09/09/2023   ALKPHOS 52 09/09/2023   AST 16 09/09/2023   ALT 15 09/09/2023   PROT 6.5 09/09/2023   ALBUMIN 3.5 09/09/2023   CALCIUM 9.7 09/09/2023   GFRAA 71 02/02/2020    Speciality Comments: No specialty comments available.  Procedures:  No procedures performed Allergies: Antihistamines, chlorpheniramine-type; Atenolol; Cefuroxime axetil; Ciprofloxacin; Co q 10 [coenzyme q10]; Crestor [rosuvastatin calcium]; Dextromethorphan-guaifenesin; Elavil [amitriptyline]; Epinephrine; Methenamine; Ramipril; Sulfamethoxazole-trimethoprim; and Vitamin d analogs   Assessment / Plan:     Visit Diagnoses: Seropositive rheumatoid arthritis (HCC) - Plan: methotrexate 50 MG/2ML injection  Joint pain is currently increased especially in right shoulder right hand and knees.  She is not interested in trial of right shoulder injection although symptoms are bothersome and wake her from night some of the time.  No peripheral joint synovitis appreciable on exam.  Plan to continue methotrexate 15 mg subcu weekly.  High risk medication use - methotrexate 15 mg subcu weekly.  Intolerant to folic acid supplement.  Had a prolonged recovery from COVID during which they interrupted medication no other serious interval infections.  Recent labs reviewed from last month with CBC and CMP were normal.  Dysuria - Plan to see [facility]. Long-term trimethoprim 100 mg daily.  On long-term maintenance antibiotic for chronic UTI indefinitely.  Symptoms currently well-controlled.  Does not appear to have been significantly affected by ongoing methotrexate treatment.  Orders: No orders of the defined types were placed in this encounter.  Meds ordered this encounter  Medications   methotrexate 50 MG/2ML injection    Sig: Inject 0.6 mLs (15 mg  total) into the skin once a week.    Dispense:  4 mL    Refill:  2     Follow-Up Instructions: Return in about 3 months (around 01/08/2024) for RA on MTX f/u 3mos.   Fuller Plan, MD  Note - This record has been created using [employer].  Chart creation errors have been sought, but may not always  have been located. Such creation errors do not reflect on  the standard of medical care.

## 2023-09-27 ENCOUNTER — Ambulatory Visit: Payer: Medicare Other | Attending: Student

## 2023-09-27 DIAGNOSIS — I5032 Chronic diastolic (congestive) heart failure: Secondary | ICD-10-CM | POA: Diagnosis not present

## 2023-09-27 LAB — ECHOCARDIOGRAM COMPLETE
AR max vel: 2.33 cm2
AV Area VTI: 2.57 cm2
AV Area mean vel: 2.4 cm2
AV Mean grad: 7 mm[Hg]
AV Peak grad: 12.3 mm[Hg]
Ao pk vel: 1.75 m/s
Area-P 1/2: 2.95 cm2
S' Lateral: 2.7 cm

## 2023-09-28 ENCOUNTER — Ambulatory Visit (INDEPENDENT_AMBULATORY_CARE_PROVIDER_SITE_OTHER): Payer: Medicare Other

## 2023-09-28 DIAGNOSIS — E538 Deficiency of other specified B group vitamins: Secondary | ICD-10-CM | POA: Diagnosis not present

## 2023-09-28 MED ORDER — CYANOCOBALAMIN 1000 MCG/ML IJ SOLN
1000.0000 ug | Freq: Once | INTRAMUSCULAR | Status: AC
  Administered 2023-09-28: 1000 ug via INTRAMUSCULAR

## 2023-09-28 NOTE — Progress Notes (Signed)
Patient presented for B 12 injection given by Makinzy Cleere, CMA to right deltoid, patient voiced no concerns nor showed any signs of distress during injection.  

## 2023-09-30 ENCOUNTER — Ambulatory Visit: Payer: Medicare Other | Attending: Cardiovascular Disease | Admitting: Cardiovascular Disease

## 2023-09-30 ENCOUNTER — Encounter: Payer: Self-pay | Admitting: Cardiovascular Disease

## 2023-09-30 VITALS — BP 132/62 | HR 78 | Ht 64.5 in | Wt 174.0 lb

## 2023-09-30 DIAGNOSIS — E785 Hyperlipidemia, unspecified: Secondary | ICD-10-CM | POA: Diagnosis not present

## 2023-09-30 DIAGNOSIS — I480 Paroxysmal atrial fibrillation: Secondary | ICD-10-CM | POA: Diagnosis not present

## 2023-09-30 DIAGNOSIS — I10 Essential (primary) hypertension: Secondary | ICD-10-CM | POA: Diagnosis not present

## 2023-09-30 NOTE — Progress Notes (Signed)
Cardiology Office Note   Date:  09/30/2023   ID:  Tammy Manning, DOB Jul 18, 1935, MRN [id number]  PCP:  [name], MD  Cardiologist:  Lorine Bears, MD   Chief Complaint  Patient presents with   Follow-up    Patient denies new or acute cardiac problems/concerns today.         History of Present Illness: Tammy Manning is a 88 y.o. female who presents for a follow-up visit regarding paroxysmal atrial fibrillation.  She has intolerance to multiple antihypertensive medications.  Cardiac catheterization in April 2018 showed normal coronary arteries and normal LV systolic function.  Abdominal aortogram showed no evidence of renal artery stenosis.    Previous echocardiogram in June 2019 showed an EF of 55 - 60%, mild LVH and mild aortic insufficiency.    She was hospitalized in June of 2022 for atrial fibrillation.  She took an extra dose of chlorthalidone for leg edema and presented with increased fatigue and palpitations.  She was noted to be in atrial fibrillation with RVR.  She was started on diltiazem drip and converted to sinus rhythm.  She was noted to be mildly hypokalemic. The patient was switched from chlorthalidone to diltiazem.  CHA2DS2-VASc score was 4 and thus she was started on anticoagulation with Eliquis 5 mg twice daily.  Repeat echocardiogram in June 2022 showed an EF of 60 to 65%, mildly dilated left atrium with mildly calcified aortic valve.  She had COVID in September and felt significant shortness of breath and fatigue after that.  She was seen in our office and had labs done which were unremarkable.  An echocardiogram was done recently which showed normal LV systolic function with mildly calcified aortic valve without significant stenosis and mild aortic insufficiency.  She feels significantly better overall.  She is close to baseline.  Past Medical History:  Diagnosis Date   A-fib (HCC)    Anginal pain (HCC)    Aortic insufficiency    a. 02/2020  Echo: EF 60-65%, no rwma, Gr1 DD, nl RV fxn, mildly dil LA, triv AI, Asc Ao 39mm; b. 04/2021 Echo: EF 60-65%, no rwma, GrI DD, nl RV fxn, mildly dil LA, AI not visualized. Mild-mod AoV sclerosis w/o stenosis.   Arthritis    Knees   B12 deficiency    Mild   Bucket-handle tear of lateral meniscus of left knee as current injury 12/30/2012   Cancer (HCC)    Skin, basal cell on nose and eyelid   Colitis    DJD (degenerative joint disease)    Low back   Dyspepsia    GERD (gastroesophageal reflux disease)    Heart murmur    Heart palpitations    History of cardiac cath    a. 02/2017 Cath: Nl cors. Nl LV fxn. Abd Aogram w/o RAS.   HOH (hard of hearing)    Hyperlipidemia    Hypertension    IBS (irritable bowel syndrome)    Lactose intolerance    Mixed incontinence    PONV (postoperative nausea and vomiting)    Urinary tract infection    Vulvar irritation    from urine pad, uses Triamcinolone oint.    Past Surgical History:  Procedure Laterality Date   ABDOMINAL AORTOGRAM N/A 03/15/2017   Procedure: Abdominal Aortogram;  Surgeon: Iran Ouch, MD;  Location: [facility];  Service: Cardiovascular;  Laterality: N/A;   ABDOMINAL HYSTERECTOMY  1982   Large fibroids and bleeding   APPENDECTOMY  BILATERAL SALPINGOOPHORECTOMY  1986   BONE GRAFT HIP ILIAC CREST     To Left arm   BREAST REDUCTION SURGERY     BREAST SURGERY     CARDIAC CATHETERIZATION     CATARACT EXTRACTION W/PHACO Left 07/07/2016   Procedure: CATARACT EXTRACTION PHACO AND INTRAOCULAR LENS PLACEMENT (IOC);  Surgeon: Galen Manila, MD;  Location: [facility];  Service: Ophthalmology;  Laterality: Left;  [place] 01:10 AP% 18.3 CDE 12.91 Fluid pak lot # [id number]   CATARACT EXTRACTION W/PHACO Right 07/28/2016   Procedure: CATARACT EXTRACTION PHACO AND INTRAOCULAR LENS PLACEMENT (IOC);  Surgeon: Galen Manila, MD;  Location: [facility];  Service: Ophthalmology;  Laterality: Right;  [place] 01:26 AP% 18.5 CDE 16.12 Fluid  pack lot # [id number]   COLONOSCOPY  07/2016   Dr. Lottie Mussel   COLONOSCOPY WITH PROPOFOL N/A 04/01/2023   Procedure: COLONOSCOPY WITH PROPOFOL;  Surgeon: Toney Reil, MD;  Location: [facility];  Service: Gastroenterology;  Laterality: N/A;   DILATION AND CURETTAGE OF UTERUS     miscarragesx2   ESOPHAGOGASTRODUODENOSCOPY     Polyps   HEMORRHOID SURGERY  2006   KNEE ARTHROSCOPY WITH LATERAL MENISECTOMY Left 12/30/2012   Procedure: KNEE ARTHROSCOPY WITH LATERAL MENISECTOMY;  Surgeon: Eulas Post, MD;  Location: [facility];  Service: Orthopedics;  Laterality: Left;  LEFT KNEE SCOPE LATERAL MENISCECTOMY   LEFT HEART CATH AND CORONARY ANGIOGRAPHY N/A 03/15/2017   Procedure: Left Heart Cath and Coronary Angiography;  Surgeon: Iran Ouch, MD;  Location: [facility];  Service: Cardiovascular;  Laterality: N/A;   MOUTH SURGERY     skin cancer eyelid  2012   TONSILLECTOMY       Current Outpatient Medications  Medication Sig Dispense Refill   atorvastatin (LIPITOR) 10 MG tablet Take 1 tablet (10 mg total) by mouth daily. 90 tablet 3   Calcium Carbonate-Vitamin D (CALCIUM 600+D PO) Take 1 tablet by mouth daily.      CARTIA XT 180 MG 24 hr capsule TAKE 1 CAPSULE(180 MG) BY MOUTH DAILY 90 capsule 3   clobetasol ointment (TEMOVATE) 0.05 % Apply 1 application topically 2 (two) times daily as needed.     clorazepate (TRANXENE) 7.5 MG tablet Take 1 tablet (7.5 mg total) by mouth daily as needed. 30 tablet 0   cyanocobalamin (,VITAMIN B-12,) 1000 MCG/ML injection Inject 1,000 mcg into the muscle every 30 (thirty) days.      ELIQUIS 5 MG TABS tablet TAKE 1 TABLET BY MOUTH TWICE DAILY 180 tablet 1   estradiol (ESTRACE) 0.1 MG/GM vaginal cream Apply a pea-sized amount to fingertip and wipe in vaginal introitus twice weekly 42.5 g 1   famotidine (PEPCID) 20 MG tablet Take 20 mg by mouth 2 (two) times daily as needed for heartburn or indigestion.     FLUoxetine (PROZAC) 10  MG capsule Take 1 capsule (10 mg total) by mouth daily. 30 capsule 1   guaiFENesin (MUCINEX) 600 MG 12 hr tablet Take 600 mg by mouth 2 (two) times daily as needed.     hydrocortisone (ANUSOL-HC) 2.5 % rectal cream Apply 1 Application topically at bedtime. To affected area for 10 days 30 g 0   hydrocortisone (ANUSOL-HC) 25 MG suppository Place 1 suppository (25 mg total) rectally 2 (two) times daily. 10 suppository 0   irbesartan (AVAPRO) 300 MG tablet TAKE 1 TABLET BY MOUTH DAILY (Patient taking differently: Take 300 mg by mouth daily.) 90 tablet 2   loperamide (IMODIUM) 2 MG capsule Take  2 mg by mouth as needed for diarrhea or loose stools.     Methotrexate 15 MG/0.6ML SOSY Inject into the skin once a week.     Na Sulfate-K Sulfate-Mg Sulf 17.5-3.13-1.6 GM/177ML SOLN See admin instructions.     triamcinolone (NASACORT) 55 MCG/ACT AERO nasal inhaler Place 2 sprays into the nose daily. 1 each 12   trimethoprim (TRIMPEX) 100 MG tablet Take 1 tablet (100 mg total) by mouth daily. 30 tablet 2   Tuberculin-Allergy Syringes 27G X 1/2" 1 ML MISC Use 1 syringe once weekly to inject methotrexate. 25 each 1   No current facility-administered medications for this visit.    Allergies:   Antihistamines, chlorpheniramine-type; Atenolol; Cefuroxime axetil; Ciprofloxacin; Co q 10 [coenzyme q10]; Crestor [rosuvastatin calcium]; Dextromethorphan-guaifenesin; Elavil [amitriptyline]; Epinephrine; Methenamine; Ramipril; Sulfamethoxazole-trimethoprim; and Vitamin d analogs    Social History:  The patient  reports that she quit smoking about 59 years ago. Her smoking use included cigarettes. She started smoking about 71 years ago. She has a 12 pack-year smoking history. She has never been exposed to tobacco smoke. She has never used smokeless tobacco. She reports that she does not drink alcohol and does not use drugs.   Family History:  The patient's family history includes Alzheimer's disease in her sister; Breast  cancer in her daughter; Cancer in her mother and paternal aunt; Diabetes in her brother, brother, sister, and sister; Esophageal cancer in her brother; Heart disease in her father; Hypothyroidism in her sister; Leukemia in her brother.    ROS:  Please see the history of present illness.   Otherwise, review of systems are positive for none.   All other systems are reviewed and negative.    PHYSICAL EXAM: VS:  BP 132/62 (BP Location: Left Arm, Patient Position: Sitting, Cuff Size: Normal)   Pulse 78   Ht 5' 4.5" (1.638 m)   Wt 174 lb (78.9 kg)   SpO2 97%   BMI 29.41 kg/m  , BMI Body mass index is 29.41 kg/m. GEN: Well nourished, well developed, in no acute distress  HEENT: normal  Neck: no JVD, carotid bruits, or masses Cardiac: RRR; no  rubs, or gallops,no edema, 2/6 systolic murmur in the aortic area. Respiratory:  clear to auscultation bilaterally, normal work of breathing GI: soft, nontender, nondistended, + BS MS: no deformity or atrophy  Skin: warm and dry, no rash Neuro:  Strength and sensation are intact Psych: euthymic mood, full affect   EKG:  EKG is ordered today. The ekg ordered today demonstrates : Normal sinus rhythm Minimal voltage criteria for LVH, may be normal variant ( R in aVL ) When compared with ECG of 06-Sep-2023 14:05, No significant change was found    Recent Labs: 03/13/2023: B Natriuretic Peptide 60.5 09/09/2023: ALT 15; BUN 16; Creatinine, Ser 0.93; Hemoglobin 11.6; Platelets 331; Potassium 4.0; Sodium 134; TSH 0.705    Lipid Panel    Component Value Date/Time   CHOL 204 (H) 01/27/2023 0820   CHOL 196 02/02/2020 1348   TRIG 102.0 01/27/2023 0820   HDL 88.50 01/27/2023 0820   HDL 80 02/02/2020 1348   CHOLHDL 2 01/27/2023 0820   VLDL 20.4 01/27/2023 0820   LDLCALC 95 01/27/2023 0820   LDLCALC 93 02/02/2020 1348   LDLDIRECT 106 (H) 02/02/2020 1348   LDLDIRECT 105.9 02/08/2013 1050      Wt Readings from Last 3 Encounters:  09/30/23 174 lb  (78.9 kg)  09/20/23 174 lb 6 oz (79.1 kg)  09/06/23 174 lb (  78.9 kg)           No data to display            ASSESSMENT AND PLAN:   1.  Paroxysmal atrial fibrillation: She is doing well overall with minimal palpitations at the present time.  Continue current dose of diltiazem.  Continue anticoagulation with Eliquis 5 mg twice daily.    2. Essential hypertension:   Blood pressure is well controlled on diltiazem and irbesartan.  3. Hyperlipidemia: Most recent lipid profile showed an LDL of 95.  Continue atorvastatin  4.  Recent shortness of breath and fatigue: That was in the setting of COVID infection.  I do not see evidence of heart failure.  I reviewed her recent echocardiogram which showed normal LV systolic function with only grade 1 diastolic dysfunction which is likely normal for age.     Disposition:   FU in 6 months   Signed,  Lorine Bears, MD 09/30/23 [facility] [place], [place] [phone]

## 2023-09-30 NOTE — Patient Instructions (Signed)

## 2023-10-08 ENCOUNTER — Ambulatory Visit: Payer: Medicare Other | Attending: Internal Medicine | Admitting: Internal Medicine

## 2023-10-08 ENCOUNTER — Telehealth: Payer: Self-pay | Admitting: *Deleted

## 2023-10-08 ENCOUNTER — Encounter: Payer: Self-pay | Admitting: Internal Medicine

## 2023-10-08 VITALS — BP 124/71 | HR 69 | Resp 14 | Ht 64.0 in | Wt 175.0 lb

## 2023-10-08 DIAGNOSIS — M059 Rheumatoid arthritis with rheumatoid factor, unspecified: Secondary | ICD-10-CM | POA: Diagnosis not present

## 2023-10-08 DIAGNOSIS — R300 Dysuria: Secondary | ICD-10-CM

## 2023-10-08 DIAGNOSIS — Z79899 Other long term (current) drug therapy: Secondary | ICD-10-CM | POA: Diagnosis not present

## 2023-10-08 MED ORDER — METHOTREXATE SODIUM CHEMO INJECTION 50 MG/2ML: 15.0000 mg | INTRAMUSCULAR | 2 refills | Status: DC

## 2023-10-08 NOTE — Telephone Encounter (Signed)
[facility], [place] called to discuss interaction between Methotrexate and Trimethoprim. Per Dr. Dimple Casey, OK for patient to take both.

## 2023-10-24 ENCOUNTER — Encounter: Payer: Self-pay | Admitting: Family Medicine

## 2023-10-25 MED ORDER — CLORAZEPATE DIPOTASSIUM 7.5 MG PO TABS: 7.5000 mg | ORAL_TABLET | Freq: Every day | ORAL | 0 refills | Status: DC | PRN

## 2023-10-25 NOTE — Telephone Encounter (Signed)
Last filled on 06/03/23 #30 tabs/ 0 refills  Last OV was for shaking on 09/20/23

## 2023-10-28 ENCOUNTER — Ambulatory Visit: Payer: Medicare Other | Admitting: *Deleted

## 2023-10-28 DIAGNOSIS — E538 Deficiency of other specified B group vitamins: Secondary | ICD-10-CM

## 2023-10-28 MED ORDER — CYANOCOBALAMIN 1000 MCG/ML IJ SOLN
1000.0000 ug | Freq: Once | INTRAMUSCULAR | Status: AC
  Administered 2023-10-28: 1000 ug via INTRAMUSCULAR

## 2023-10-28 NOTE — Progress Notes (Signed)
Per orders of Mordecai Maes, NP , injection of B-12 given by Blenda Mounts M in left deltoid. Patient tolerated injection well.   PCP out of the office so routing to Audria Nine, NP

## 2023-11-15 ENCOUNTER — Other Ambulatory Visit: Payer: Self-pay | Admitting: Cardiovascular Disease

## 2023-11-15 DIAGNOSIS — I4891 Unspecified atrial fibrillation: Secondary | ICD-10-CM

## 2023-11-15 NOTE — Telephone Encounter (Signed)
Refill Request.  

## 2023-11-15 NOTE — Telephone Encounter (Signed)
Eliquis 5mg  refill request received. Patient is 88 years old, weight-79.4kg, Crea-0.93 on 09/09/23, Diagnosis-Afib, and last seen by Dr. Kirke Corin on 09/30/23. Dose is appropriate based on dosing criteria. Will send in refill to requested pharmacy.

## 2023-11-26 ENCOUNTER — Encounter: Payer: Self-pay | Admitting: Family Medicine

## 2023-11-26 ENCOUNTER — Ambulatory Visit (INDEPENDENT_AMBULATORY_CARE_PROVIDER_SITE_OTHER): Payer: Medicare Other | Admitting: Family Medicine

## 2023-11-26 VITALS — BP 116/62 | HR 76 | Temp 98.2°F | Ht 64.0 in | Wt 173.1 lb

## 2023-11-26 DIAGNOSIS — J0110 Acute frontal sinusitis, unspecified: Secondary | ICD-10-CM | POA: Diagnosis not present

## 2023-11-26 DIAGNOSIS — S80219A Abrasion, unspecified knee, initial encounter: Secondary | ICD-10-CM

## 2023-11-26 DIAGNOSIS — E538 Deficiency of other specified B group vitamins: Secondary | ICD-10-CM

## 2023-11-26 MED ORDER — AMOXICILLIN-POT CLAVULANATE 875-125 MG PO TABS: 1.0000 | ORAL_TABLET | Freq: Two times a day (BID) | ORAL | 0 refills | Status: DC

## 2023-11-26 MED ORDER — CYANOCOBALAMIN 1000 MCG/ML IJ SOLN
1000.0000 ug | Freq: Once | INTRAMUSCULAR | Status: AC
  Administered 2023-11-26: 1000 ug via INTRAMUSCULAR

## 2023-11-26 NOTE — Progress Notes (Signed)
 Subjective:    Patient ID: Tammy Manning, female    DOB: [birth year], 89 y.o.   MRN: [id number]  HPI  Wt Readings from Last 3 Encounters:  11/26/23 173 lb 2 oz (78.5 kg)  10/08/23 175 lb (79.4 kg)  09/30/23 174 lb (78.9 kg)   29.72 kg/m  Vitals:   11/26/23 1351  BP: 116/62  Pulse: 76  Temp: 98.2 F (36.8 C)  SpO2: 97%    Pt presents with uri symptoms for 2 weeks  Also fell on knees  Also B12 shot   Started with cold symptoms  Cough   Nasal d/c is yellow and thick  Facial pain and pressure  Both sides around eyes  Worse to bend over   Ears are full/cannot hear well   Throat was sore to start -that is better   Cough is dry -not productive  No wheezing   Had covid in oct   Symptom care Nasal saline  Has some left over tussionex- took 1/2 tsp    Also wants B12 shot    Patient Active Problem List   Diagnosis Date Noted   Abrasion of knee 11/26/2023   Anxious mood 09/20/2023   Tremor of both hands 09/20/2023   Viral URI with cough 07/21/2023   Abrasion of left arm 04/28/2023   Head injury 04/28/2023   Superficial injury of skin 04/21/2023   Dysuria 04/12/2023   Diverticulosis 03/21/2023   Acute colitis 03/13/2023   Chronic diastolic CHF (congestive heart failure) (HCC) 03/13/2023   Obesity (BMI 30-39.9) 03/13/2023   PAF (paroxysmal atrial fibrillation) (HCC) 03/13/2023   Recurrent UTI (urinary tract infection) 02/18/2023   Intertrigo 02/16/2023   Hemorrhoids 02/02/2023   Urinary incontinence 02/02/2023   Acute cystitis 01/26/2023   Dilated aortic root (HCC) 01/26/2023   Osteopenia 01/26/2023   Multinodular goiter 02/27/2022   Encounter for screening mammogram for breast cancer 01/23/2022   High risk medication use 10/07/2021   Atrial fibrillation with RVR (HCC) 04/28/2021   Hypertrophic toenail 04/22/2021   Seropositive rheumatoid arthritis (HCC) 11/25/2020   Bilateral hand pain 11/25/2020   Bilateral shoulder pain 10/16/2020   Joint pain  10/16/2020   Acute sinusitis 10/09/2020   Estrogen deficiency 04/30/2020   Dry mouth 03/15/2020   Osteoarthritis of right hip 01/02/2020   Right thyroid  nodule 05/12/2018   History of TIA (transient ischemic attack) 05/09/2018   Aortic insufficiency 09/10/2015   Stress reaction 09/08/2015   Encounter for Medicare annual wellness exam 02/28/2013   Bucket-handle tear of lateral meniscus of left knee as current injury 12/30/2012   Episodic atrial fibrillation (HCC) 11/17/2012   Hearing loss of both ears 01/14/2012   B12 deficiency 03/17/2010   GERD (gastroesophageal reflux disease) 03/17/2010   POSTMENOPAUSAL STATUS 08/20/2008   Essential hypertension 07/19/2007   Hyperlipidemia 06/24/2007   IBS 06/24/2007   DERMATITIS, ATOPIC 06/24/2007   SKIN CANCER, HX OF 06/24/2007   Past Medical History:  Diagnosis Date   A-fib (HCC)    Anginal pain (HCC)    Aortic insufficiency    a. 02/2020 Echo: EF 60-65%, no rwma, Gr1 DD, nl RV fxn, mildly dil LA, triv AI, Asc Ao 39mm; b. 04/2021 Echo: EF 60-65%, no rwma, GrI DD, nl RV fxn, mildly dil LA, AI not visualized. Mild-mod AoV sclerosis w/o stenosis.   Arthritis    Knees   B12 deficiency    Mild   Bucket-handle tear of lateral meniscus of left knee as current injury 12/30/2012   Cancer (HCC)  Skin, basal cell on nose and eyelid   Colitis    COVID    DJD (degenerative joint disease)    Low back   Dyspepsia    GERD (gastroesophageal reflux disease)    Heart murmur    Heart palpitations    History of cardiac cath    a. 02/2017 Cath: Nl cors. Nl LV fxn. Abd Aogram w/o RAS.   HOH (hard of hearing)    Hyperlipidemia    Hypertension    IBS (irritable bowel syndrome)    Lactose intolerance    Mixed incontinence    PONV (postoperative nausea and vomiting)    Urinary tract infection    Vulvar irritation    from urine pad, uses Triamcinolone  oint.   Past Surgical History:  Procedure Laterality Date   ABDOMINAL AORTOGRAM N/A 03/15/2017    Procedure: Abdominal Aortogram;  Surgeon: Deatrice DELENA Cage, MD;  Location: [facility];  Service: Cardiovascular;  Laterality: N/A;   ABDOMINAL HYSTERECTOMY  1982   Large fibroids and bleeding   APPENDECTOMY     BILATERAL SALPINGOOPHORECTOMY  1986   BONE GRAFT HIP ILIAC CREST     To Left arm   BREAST REDUCTION SURGERY     BREAST SURGERY     CARDIAC CATHETERIZATION     CATARACT EXTRACTION W/PHACO Left 07/07/2016   Procedure: CATARACT EXTRACTION PHACO AND INTRAOCULAR LENS PLACEMENT (IOC);  Surgeon: Elsie Carmine, MD;  Location: [facility];  Service: Ophthalmology;  Laterality: Left;  US  01:10 AP% 18.3 CDE 12.91 Fluid pak lot # [id number]   CATARACT EXTRACTION W/PHACO Right 07/28/2016   Procedure: CATARACT EXTRACTION PHACO AND INTRAOCULAR LENS PLACEMENT (IOC);  Surgeon: Elsie Carmine, MD;  Location: [facility];  Service: Ophthalmology;  Laterality: Right;  US  01:26 AP% 18.5 CDE 16.12 Fluid pack lot # [id number]   COLONOSCOPY  07/2016   Dr. Kemper   COLONOSCOPY WITH PROPOFOL  N/A 04/01/2023   Procedure: COLONOSCOPY WITH PROPOFOL ;  Surgeon: Unk Corinn Skiff, MD;  Location: [facility];  Service: Gastroenterology;  Laterality: N/A;   DILATION AND CURETTAGE OF UTERUS     miscarragesx2   ESOPHAGOGASTRODUODENOSCOPY     Polyps   HEMORRHOID SURGERY  2006   KNEE ARTHROSCOPY WITH LATERAL MENISECTOMY Left 12/30/2012   Procedure: KNEE ARTHROSCOPY WITH LATERAL MENISECTOMY;  Surgeon: Fonda SHAUNNA Olmsted, MD;  Location: [facility];  Service: Orthopedics;  Laterality: Left;  LEFT KNEE SCOPE LATERAL MENISCECTOMY   LEFT HEART CATH AND CORONARY ANGIOGRAPHY N/A 03/15/2017   Procedure: Left Heart Cath and Coronary Angiography;  Surgeon: Deatrice DELENA Cage, MD;  Location: [facility];  Service: Cardiovascular;  Laterality: N/A;   MOUTH SURGERY     skin cancer eyelid  2012   TONSILLECTOMY     Social History   Tobacco Use   Smoking status: Former    Current packs/day: 0.00     Average packs/day: 1 pack/day for 12.0 years (12.0 ttl pk-yrs)    Types: Cigarettes    Start date: 11/24/1951    Quit date: 11/24/1963    Years since quitting: 60.0    Passive exposure: Never   Smokeless tobacco: Never  Vaping Use   Vaping status: Never Used  Substance Use Topics   Alcohol use: No    Alcohol/week: 0.0 standard drinks of alcohol   Drug use: No   Family History  Problem Relation Age of Onset   Cancer Mother        Colon   Heart disease Father  CAD   Diabetes Sister    Hypothyroidism Sister    Diabetes Sister    Alzheimer's disease Sister    Diabetes Brother    Leukemia Brother    Esophageal cancer Brother    Diabetes Brother    Cancer Paternal Aunt        Breast   Breast cancer Daughter    Allergies  Allergen Reactions   Antihistamines, Chlorpheniramine-Type Other (See Comments)    Reaction:  Makes pt hyper    Atenolol Hypertension   Cefuroxime  Axetil Other (See Comments)    Upset stomach   Ciprofloxacin Other (See Comments)    Upset stomach   Co Q 10 [Coenzyme Q10] Other (See Comments)    Reaction:  GI upset    Crestor  [Rosuvastatin  Calcium ] Other (See Comments)    Reaction:  Myalgias    Dextromethorphan-Guaifenesin Other (See Comments)    Reaction:  Made pt jittery    Elavil  [Amitriptyline ]     Felt worse /tearful    Epinephrine  Hives   Methenamine      Tongue discomfort    Ramipril Other (See Comments)    Upset stomach   Sulfamethoxazole-Trimethoprim  Other (See Comments)    Upset stomach   Vitamin D  Analogs Other (See Comments)    Reaction:  GI upset    Current Outpatient Medications on File Prior to Visit  Medication Sig Dispense Refill   atorvastatin  (LIPITOR) 10 MG tablet Take 1 tablet (10 mg total) by mouth daily. 90 tablet 3   Calcium  Carbonate-Vitamin D  (CALCIUM  600+D PO) Take 1 tablet by mouth daily.      CARTIA  XT 180 MG 24 hr capsule TAKE 1 CAPSULE(180 MG) BY MOUTH DAILY 90 capsule 3   clobetasol  ointment (TEMOVATE ) 0.05 %  Apply 1 application topically 2 (two) times daily as needed.     clorazepate  (TRANXENE ) 7.5 MG tablet Take 1 tablet (7.5 mg total) by mouth daily as needed. 30 tablet 0   cyanocobalamin  (,VITAMIN B-12,) 1000 MCG/ML injection Inject 1,000 mcg into the muscle every 30 (thirty) days.      ELIQUIS  5 MG TABS tablet TAKE 1 TABLET BY MOUTH TWICE DAILY 180 tablet 1   estradiol  (ESTRACE ) 0.1 MG/GM vaginal cream Apply a pea-sized amount to fingertip and wipe in vaginal introitus twice weekly 42.5 g 1   famotidine  (PEPCID ) 20 MG tablet Take 20 mg by mouth 2 (two) times daily as needed for heartburn or indigestion.     guaiFENesin (MUCINEX) 600 MG 12 hr tablet Take 600 mg by mouth 2 (two) times daily as needed.     hydrocortisone  (ANUSOL -HC) 2.5 % rectal cream Apply 1 Application topically at bedtime. To affected area for 10 days 30 g 0   hydrocortisone  (ANUSOL -HC) 25 MG suppository Place 1 suppository (25 mg total) rectally 2 (two) times daily. 10 suppository 0   irbesartan  (AVAPRO ) 300 MG tablet TAKE 1 TABLET BY MOUTH DAILY (Patient taking differently: Take 300 mg by mouth daily.) 90 tablet 2   loperamide  (IMODIUM ) 2 MG capsule Take 2 mg by mouth as needed for diarrhea or loose stools.     methotrexate  50 MG/2ML injection Inject 0.6 mLs (15 mg total) into the skin once a week. 4 mL 2   triamcinolone  (NASACORT ) 55 MCG/ACT AERO nasal inhaler Place 2 sprays into the nose daily. 1 each 12   trimethoprim  (TRIMPEX ) 100 MG tablet Take 1 tablet (100 mg total) by mouth daily. 30 tablet 2   Tuberculin-Allergy  Syringes 27G X 1/2 1 ML  MISC Use 1 syringe once weekly to inject methotrexate . 25 each 1   No current facility-administered medications on file prior to visit.    Review of Systems  Constitutional:  Positive for fatigue. Negative for activity change, appetite change, fever and unexpected weight change.  HENT:  Positive for hearing loss, postnasal drip, sinus pressure and sinus pain. Negative for congestion,  ear pain, facial swelling, nosebleeds, rhinorrhea and sore throat.   Eyes:  Negative for pain, redness, itching and visual disturbance.  Respiratory:  Positive for cough. Negative for shortness of breath and wheezing.   Cardiovascular:  Negative for chest pain and palpitations.  Gastrointestinal:  Negative for abdominal pain, blood in stool, constipation, diarrhea, nausea and vomiting.  Endocrine: Negative for polydipsia and polyuria.  Genitourinary:  Negative for dysuria, frequency and urgency.  Musculoskeletal:  Negative for arthralgias, back pain and myalgias.  Skin:  Negative for pallor and rash.  Allergic/Immunologic: Negative for environmental allergies and immunocompromised state.  Neurological:  Negative for dizziness, tremors, syncope, weakness, numbness and headaches.  Hematological:  Negative for adenopathy. Does not bruise/bleed easily.  Psychiatric/Behavioral:  Negative for decreased concentration and dysphoric mood. The patient is not nervous/anxious.        Objective:   Physical Exam Constitutional:      General: She is not in acute distress.    Appearance: Normal appearance. She is well-developed. She is not ill-appearing.     Comments: Overwt   HENT:     Head: Normocephalic and atraumatic.     Comments: Nares are injected and congested  Clear pnd   Bilateral maxillary sinus tenderness     Right Ear: Tympanic membrane, ear canal and external ear normal.     Left Ear: Tympanic membrane, ear canal and external ear normal.     Ears:     Comments: TMs are dull    Nose: Congestion and rhinorrhea present.     Mouth/Throat:     Mouth: Mucous membranes are moist.     Pharynx: No oropharyngeal exudate or posterior oropharyngeal erythema.     Comments: Clear pnd Eyes:     General:        Right eye: No discharge.        Left eye: No discharge.     Conjunctiva/sclera: Conjunctivae normal.     Pupils: Pupils are equal, round, and reactive to light.  Cardiovascular:      Rate and Rhythm: Normal rate and regular rhythm.  Pulmonary:     Effort: Pulmonary effort is normal. No respiratory distress.     Breath sounds: Normal breath sounds. No stridor. No wheezing, rhonchi or rales.  Musculoskeletal:     Cervical back: Normal range of motion and neck supple.  Lymphadenopathy:     Cervical: No cervical adenopathy.  Skin:    General: Skin is warm and dry.     Coloration: Skin is not jaundiced.     Findings: Bruising present. No rash.     Comments: Abrasion of right knee- with some scabbing (more abrasion than skin tear) Scant drop of blood  Mild erythema  No swelling beyond baseline   Neurological:     Mental Status: She is alert.     Cranial Nerves: No cranial nerve deficit.  Psychiatric:        Mood and Affect: Mood normal.           Assessment & Plan:   Problem List Items Addressed This Visit       Respiratory  Acute sinusitis - Primary   Suspect bacterial 2 weeks into uri with facial pain /pressure/ear  pressure and mucopurulent nasal mucous  Also worse balance due to ears  Treatment with augmentin - tolerated this well in past  See AVS for symptom treatment  Saline irrigation  Flonase   Update if not starting to improve in a week or if worsening  Call back and Er precautions noted in detail today    For dizziness- recommend use walker until feeling better      Relevant Medications   amoxicillin -clavulanate (AUGMENTIN ) 875-125 MG tablet     Musculoskeletal and Integument   Abrasion of knee   From fall at home (balance affected by recent sinus infection) Advised to keep clean with soap and water  Cover as needed  Antibiotic ointment prn   Td is up to date Call back and Er precautions noted in detail today    Instructed to use her walker until balance improves (was prescribed augmentin  for sinusitis today)        Other   B12 deficiency   Monthly B12 shot today

## 2023-11-26 NOTE — Assessment & Plan Note (Signed)
Monthly B12 shot today 

## 2023-11-26 NOTE — Assessment & Plan Note (Signed)
 Suspect bacterial 2 weeks into uri with facial pain /pressure/ear  pressure and mucopurulent nasal mucous  Also worse balance due to ears  Treatment with augmentin - tolerated this well in past  See AVS for symptom treatment  Saline irrigation  Flonase   Update if not starting to improve in a week or if worsening  Call back and Er precautions noted in detail today    For dizziness- recommend use walker until feeling better

## 2023-11-26 NOTE — Patient Instructions (Addendum)
 Keep using nasal saline Take augmentin  as directed   Use flonase  nasal spray also daily for 2 weeks This will help congestion also   Keep knee abrasions clean with soap and water   Use your walker until balance is better   Update if not starting to improve in a week or if worsening    B12 shot today

## 2023-11-26 NOTE — Assessment & Plan Note (Signed)
 From fall at home (balance affected by recent sinus infection) Advised to keep clean with soap and water  Cover as needed  Antibiotic ointment prn   Td is up to date Call back and Er precautions noted in detail today    Instructed to use her walker until balance improves (was prescribed augmentin  for sinusitis today)

## 2023-11-29 ENCOUNTER — Ambulatory Visit: Payer: Medicare Other | Admitting: Family Medicine

## 2023-11-30 ENCOUNTER — Ambulatory Visit: Payer: Medicare Other

## 2023-12-06 ENCOUNTER — Other Ambulatory Visit: Payer: Self-pay | Admitting: Family Medicine

## 2023-12-06 DIAGNOSIS — I10 Essential (primary) hypertension: Secondary | ICD-10-CM

## 2023-12-22 DIAGNOSIS — M170 Bilateral primary osteoarthritis of knee: Secondary | ICD-10-CM | POA: Diagnosis not present

## 2023-12-28 ENCOUNTER — Ambulatory Visit (INDEPENDENT_AMBULATORY_CARE_PROVIDER_SITE_OTHER): Payer: Medicare Other

## 2023-12-28 VITALS — Ht 64.0 in | Wt 169.0 lb

## 2023-12-28 DIAGNOSIS — Z0000 Encounter for general adult medical examination without abnormal findings: Secondary | ICD-10-CM | POA: Diagnosis not present

## 2023-12-28 NOTE — Patient Instructions (Signed)
 Ms. Waguespack , Thank you for taking time to come for your Medicare Wellness Visit. I appreciate your ongoing commitment to your health goals. Please review the following plan we discussed and let me know if I can assist you in the future.   Referrals/Orders/Follow-Ups/Clinician Recommendations: none  This is a list of the screening recommended for you and due dates:  Health Maintenance  Topic Date Due   Zoster (Shingles) Vaccine (1 of 2) 03/23/1954   COVID-19 Vaccine (4 - 2024-25 season) 09/19/2024*   Mammogram  01/25/2033*   Medicare Annual Wellness Visit  12/27/2024   Colon Cancer Screening  03/31/2026   DTaP/Tdap/Td vaccine (4 - Tdap) 04/20/2033   Pneumonia Vaccine  Completed   Flu Shot  Completed   DEXA scan (bone density measurement)  Completed   HPV Vaccine  Aged Out  *Topic was postponed. The date shown is not the original due date.    Advanced directives: (Copy Requested) Please bring a copy of your health care power of attorney and living will to the office to be added to your chart at your convenience.  Next Medicare Annual Wellness Visit scheduled for next year: Yes 12/28/2024 @ 1:40pm televisit

## 2023-12-28 NOTE — Progress Notes (Signed)
 Subjective:   Tammy Manning is a 89 y.o. female who presents for Medicare Annual (Subsequent) preventive examination.  Visit Complete: Virtual I connected with  [name] on 12/28/23 by a audio enabled telemedicine application and verified that I am speaking with the correct person using two identifiers.  Patient Location: [id number]  Provider Location: Office/Clinic  I discussed the limitations of evaluation and management by telemedicine. The patient expressed understanding and agreed to proceed.  Vital Signs: Because this visit was a virtual/telehealth visit, some criteria may be missing or patient reported. Any vitals not documented were not able to be obtained and vitals that have been documented are patient reported.  Patient Medicare AWV questionnaire was completed by the patient on (not done); I have confirmed that all information answered by patient is correct and no changes since this date.  Cardiac Risk Factors include: advanced age (>81men, >88 women);dyslipidemia;hypertension     Objective:    Today's Vitals   12/28/23 1351  Weight: 169 lb (76.7 kg)  Height: 5' 4 (1.626 m)   Body mass index is 29.01 kg/m.     12/28/2023    2:02 PM 04/20/2023   11:25 AM 04/01/2023    8:22 AM 03/13/2023    5:00 AM 03/12/2023   11:34 PM 12/25/2022   12:40 PM 12/24/2021    1:23 PM  Advanced Directives  Does Patient Have a Medical Advance Directive? Yes Yes Yes Yes No Yes Yes  Type of Estate Agent of [place];Living will Healthcare Power of [place];Living will Living will Out of facility DNR (pink MOST or yellow form);Living will;Healthcare Power of [employer] of [place];Living will Living will  Does patient want to make changes to medical advance directive?    No - Patient declined  No - Patient declined Yes (MAU/Ambulatory/Procedural Areas - Information given)  Copy of Healthcare Power of Attorney in Chart? No - copy requested   No - copy  requested  Yes - validated most recent copy scanned in chart (See row information)     Current Medications (verified) Outpatient Encounter Medications as of 12/28/2023  Medication Sig   amoxicillin -clavulanate (AUGMENTIN ) 875-125 MG tablet Take 1 tablet by mouth 2 (two) times daily. With food   atorvastatin  (LIPITOR) 10 MG tablet Take 1 tablet (10 mg total) by mouth daily.   Calcium  Carbonate-Vitamin D  (CALCIUM  600+D PO) Take 1 tablet by mouth daily.    CARTIA  XT 180 MG 24 hr capsule TAKE 1 CAPSULE(180 MG) BY MOUTH DAILY   clobetasol  ointment (TEMOVATE ) 0.05 % Apply 1 application topically 2 (two) times daily as needed.   clorazepate  (TRANXENE ) 7.5 MG tablet Take 1 tablet (7.5 mg total) by mouth daily as needed.   cyanocobalamin  (,VITAMIN B-12,) 1000 MCG/ML injection Inject 1,000 mcg into the muscle every 30 (thirty) days.    ELIQUIS  5 MG TABS tablet TAKE 1 TABLET BY MOUTH TWICE DAILY   estradiol  (ESTRACE ) 0.1 MG/GM vaginal cream Apply a pea-sized amount to fingertip and wipe in vaginal introitus twice weekly   famotidine  (PEPCID ) 20 MG tablet Take 20 mg by mouth 2 (two) times daily as needed for heartburn or indigestion.   guaiFENesin (MUCINEX) 600 MG 12 hr tablet Take 600 mg by mouth 2 (two) times daily as needed.   hydrocortisone  (ANUSOL -HC) 2.5 % rectal cream Apply 1 Application topically at bedtime. To affected area for 10 days   hydrocortisone  (ANUSOL -HC) 25 MG suppository Place 1 suppository (25 mg total) rectally 2 (two) times daily.  irbesartan  (AVAPRO ) 300 MG tablet TAKE 1 TABLET BY MOUTH DAILY   loperamide  (IMODIUM ) 2 MG capsule Take 2 mg by mouth as needed for diarrhea or loose stools.   methotrexate  50 MG/2ML injection Inject 0.6 mLs (15 mg total) into the skin once a week.   triamcinolone  (NASACORT ) 55 MCG/ACT AERO nasal inhaler Place 2 sprays into the nose daily.   trimethoprim  (TRIMPEX ) 100 MG tablet Take 1 tablet (100 mg total) by mouth daily.   Tuberculin-Allergy  Syringes 27G  X 1/2 1 ML MISC Use 1 syringe once weekly to inject methotrexate .   No facility-administered encounter medications on file as of 12/28/2023.    Allergies (verified) Antihistamines, chlorpheniramine-type; Atenolol; Cefuroxime  axetil; Ciprofloxacin; Co q 10 [coenzyme q10]; Crestor  [rosuvastatin  calcium ]; Dextromethorphan-guaifenesin; Elavil  [amitriptyline ]; Epinephrine ; Methenamine ; Ramipril; Sulfamethoxazole-trimethoprim ; and Vitamin d  analogs   History: Past Medical History:  Diagnosis Date   A-fib (HCC)    Anginal pain (HCC)    Aortic insufficiency    a. 02/2020 Echo: EF 60-65%, no rwma, Gr1 DD, nl RV fxn, mildly dil LA, triv AI, Asc Ao 39mm; b. 04/2021 Echo: EF 60-65%, no rwma, GrI DD, nl RV fxn, mildly dil LA, AI not visualized. Mild-mod AoV sclerosis w/o stenosis.   Arthritis    Knees   B12 deficiency    Mild   Bucket-handle tear of lateral meniscus of left knee as current injury 12/30/2012   Cancer (HCC)    Skin, basal cell on nose and eyelid   Colitis    COVID    DJD (degenerative joint disease)    Low back   Dyspepsia    GERD (gastroesophageal reflux disease)    Heart murmur    Heart palpitations    History of cardiac cath    a. 02/2017 Cath: Nl cors. Nl LV fxn. Abd Aogram w/o RAS.   HOH (hard of hearing)    Hyperlipidemia    Hypertension    IBS (irritable bowel syndrome)    Lactose intolerance    Mixed incontinence    PONV (postoperative nausea and vomiting)    Urinary tract infection    Vulvar irritation    from urine pad, uses Triamcinolone  oint.   Past Surgical History:  Procedure Laterality Date   ABDOMINAL AORTOGRAM N/A 03/15/2017   Procedure: Abdominal Aortogram;  Surgeon: Deatrice DELENA Cage, MD;  Location: [facility];  Service: Cardiovascular;  Laterality: N/A;   ABDOMINAL HYSTERECTOMY  1982   Large fibroids and bleeding   APPENDECTOMY     BILATERAL SALPINGOOPHORECTOMY  1986   BONE GRAFT HIP ILIAC CREST     To Left arm   BREAST REDUCTION SURGERY      BREAST SURGERY     CARDIAC CATHETERIZATION     CATARACT EXTRACTION W/PHACO Left 07/07/2016   Procedure: CATARACT EXTRACTION PHACO AND INTRAOCULAR LENS PLACEMENT (IOC);  Surgeon: Elsie Carmine, MD;  Location: [facility];  Service: Ophthalmology;  Laterality: Left;  US  01:10 AP% 18.3 CDE 12.91 Fluid pak lot # [id number]   CATARACT EXTRACTION W/PHACO Right 07/28/2016   Procedure: CATARACT EXTRACTION PHACO AND INTRAOCULAR LENS PLACEMENT (IOC);  Surgeon: Elsie Carmine, MD;  Location: [facility];  Service: Ophthalmology;  Laterality: Right;  US  01:26 AP% 18.5 CDE 16.12 Fluid pack lot # [id number]   COLONOSCOPY  07/2016   Dr. Kemper   COLONOSCOPY WITH PROPOFOL  N/A 04/01/2023   Procedure: COLONOSCOPY WITH PROPOFOL ;  Surgeon: Unk Corinn Skiff, MD;  Location: [facility];  Service: Gastroenterology;  Laterality: N/A;   DILATION AND  CURETTAGE OF UTERUS     miscarragesx2   ESOPHAGOGASTRODUODENOSCOPY     Polyps   HEMORRHOID SURGERY  2006   KNEE ARTHROSCOPY WITH LATERAL MENISECTOMY Left 12/30/2012   Procedure: KNEE ARTHROSCOPY WITH LATERAL MENISECTOMY;  Surgeon: Fonda SHAUNNA Olmsted, MD;  Location: [facility];  Service: Orthopedics;  Laterality: Left;  LEFT KNEE SCOPE LATERAL MENISCECTOMY   LEFT HEART CATH AND CORONARY ANGIOGRAPHY N/A 03/15/2017   Procedure: Left Heart Cath and Coronary Angiography;  Surgeon: Deatrice DELENA Cage, MD;  Location: [facility];  Service: Cardiovascular;  Laterality: N/A;   MOUTH SURGERY     skin cancer eyelid  2012   TONSILLECTOMY     Family History  Problem Relation Age of Onset   Cancer Mother        Colon   Heart disease Father        CAD   Diabetes Sister    Hypothyroidism Sister    Diabetes Sister    Alzheimer's disease Sister    Diabetes Brother    Leukemia Brother    Esophageal cancer Brother    Diabetes Brother    Cancer Paternal Aunt        Breast   Breast cancer Daughter    Social History   Socioeconomic History   Marital  status: Married    Spouse name: Not on file   Number of children: 2   Years of education: Not on file   Highest education level: Not on file  Occupational History   Occupation: It Trainer: RETIRED  Tobacco Use   Smoking status: Former    Current packs/day: 0.00    Average packs/day: 1 pack/day for 12.0 years (12.0 ttl pk-yrs)    Types: Cigarettes    Start date: 11/24/1951    Quit date: 11/24/1963    Years since quitting: 60.1    Passive exposure: Never   Smokeless tobacco: Never  Vaping Use   Vaping status: Never Used  Substance and Sexual Activity   Alcohol use: No    Alcohol/week: 0.0 standard drinks of alcohol   Drug use: No   Sexual activity: Not Currently  Other Topics Concern   Not on file  Social History Narrative   Wellsite geologist.     Social Drivers of Corporate Investment Banker Strain: Low Risk  (12/28/2023)   Overall Financial Resource Strain (CARDIA)    Difficulty of Paying Living Expenses: Not hard at all  Food Insecurity: No Food Insecurity (12/28/2023)   Hunger Vital Sign    Worried About Running Out of Food in the Last Year: Never true    Ran Out of Food in the Last Year: Never true  Transportation Needs: No Transportation Needs (12/28/2023)   PRAPARE - Administrator, Civil Service (Medical): No    Lack of Transportation (Non-Medical): No  Physical Activity: Insufficiently Active (12/28/2023)   Exercise Vital Sign    Days of Exercise per Week: 7 days    Minutes of Exercise per Session: 20 min  Stress: No Stress Concern Present (12/28/2023)   [employer] of Occupational Health - Occupational Stress Questionnaire    Feeling of Stress : Only a little  Social Connections: Moderately Isolated (12/28/2023)   Social Connection and Isolation Panel [NHANES]    Frequency of Communication with Friends and Family: More than three times a week    Frequency of Social Gatherings with Friends and Family: More than three times a week  Attends  Religious Services: Never    Active Member of Clubs or Organizations: No    Attends Banker Meetings: Never    Marital Status: Married    Tobacco Counseling Counseling given: Not Answered   Clinical Intake:  Pre-visit preparation completed: Yes  Pain : No/denies pain  BMI - recorded: 29.01 Nutritional Status: BMI 25 -29 Overweight Nutritional Risks: None Diabetes: No  How often do you need to have someone help you when you read instructions, pamphlets, or other written materials from your doctor or pharmacy?: 1 - Never  Interpreter Needed?: No  Comments: lives with husband Information entered by :: B.Irania Durell,LPN   Activities of Daily Living    12/28/2023    2:03 PM 03/13/2023    5:00 AM  In your present state of health, do you have any difficulty performing the following activities:  Hearing? 0 1  Vision? 0 0  Difficulty concentrating or making decisions? 0 0  Walking or climbing stairs? 0 1  Dressing or bathing? 0 1  Doing errands, shopping? 0 0  Preparing Food and eating ? N   Using the Toilet? N   In the past six months, have you accidently leaked urine? Y   Do you have problems with loss of bowel control? N   Managing your Medications? N   Managing your Finances? N   Housekeeping or managing your Housekeeping? N     Patient Care Team: [name], Laine LABOR, MD as PCP - General (Family Medicine) Darron Deatrice LABOR, MD as PCP - Cardiology (Cardiology) Duwayne Purchase, MD (Orthopedic Surgery) Luis Purchase, MD (Gastroenterology) Darron Deatrice LABOR, MD as Consulting Physician (Cardiology) Jaye Fallow, MD as Referring Physician (Ophthalmology) Fate Morna SAILOR, [facility] (Inactive) as Pharmacist (Pharmacist)  Indicate any recent Medical Services you may have received from other than [facility] providers in the past year (date may be approximate).     Assessment:   This is a routine wellness examination for [place].  Hearing/Vision screen Hearing  Screening - Comments:: Pt says she wears hearing aids and is good Vision Screening - Comments:: Pt says she wears glasses for reading Dr Jaye   Goals Addressed             This Visit's Progress    Maintain Healthy Lifestyle   On track    12/29/23- Stay active Healthy diet     COMPLETED: Patient Stated   On track    Would like to relieve pain with Methotrexate        Depression Screen    12/28/2023    1:59 PM 11/26/2023    2:08 PM 07/21/2023    3:52 PM 03/19/2023   11:37 AM 02/18/2023   10:48 AM 12/25/2022   12:37 PM 12/24/2021    1:26 PM  PHQ 2/9 Scores  PHQ - 2 Score 0 0 0 0 0 0 0  PHQ- 9 Score  0 0 6       Fall Risk    12/28/2023    1:54 PM 07/21/2023    3:51 PM 03/19/2023   11:36 AM 02/18/2023   10:48 AM 12/25/2022   12:39 PM  Fall Risk   Falls in the past year? 1 0 0 0 0  Number falls in past yr: 0 0 0 0 0  Injury with Fall? 0 0 0 0   Comment skinned knees 2 months ago      Risk for fall due to : No Fall Risks No Fall Risks No Fall Risks  Follow up Education provided;Falls prevention discussed Falls evaluation completed Falls evaluation completed Falls evaluation completed;Education provided;Falls prevention discussed Falls evaluation completed;Falls prevention discussed    MEDICARE RISK AT HOME: Medicare Risk at Home Any stairs in or around the home?: Yes (elevator) If so, are there any without handrails?: No Home free of loose throw rugs in walkways, pet beds, electrical cords, etc?: Yes Adequate lighting in your home to reduce risk of falls?: Yes Life alert?: No Use of a cane, walker or w/c?: No Grab bars in the bathroom?: Yes Shower chair or bench in shower?: Yes Elevated toilet seat or a handicapped toilet?: Yes  TIMED UP AND GO:  Was the test performed?  No    Cognitive Function:    04/26/2020    9:06 AM 04/25/2019   11:29 AM 04/21/2018   11:20 AM 04/16/2017    9:35 AM 04/14/2016    3:00 PM  MMSE - Mini Mental State Exam  Orientation to time 5 5 5 5 5    Orientation to Place 5 5 5 5 5   Registration 3 3 3 3 3   Attention/ Calculation 5 0 0 0 0  Recall 3 2 3 3 3   Recall-comments  unable to recall 1 of 3 words     Language- name 2 objects  0 0 0 0  Language- repeat 1 1 1 1 1   Language- follow 3 step command  0 3 3 3   Language- read & follow direction  0 0 0 0  Write a sentence  0 0 0 0  Copy design  0 0 0 0  Total score  16 20 20 20         12/28/2023    2:04 PM 12/25/2022   12:44 PM 12/24/2021    1:28 PM  6CIT Screen  What Year? 0 points 0 points 0 points  What month? 0 points 0 points 0 points  What time? 0 points 0 points 0 points  Count back from 20 0 points 0 points 0 points  Months in reverse 0 points 0 points 0 points  Repeat phrase 0 points 0 points 0 points  Total Score 0 points 0 points 0 points    Immunizations Immunization History  Administered Date(s) Administered   Fluad Quad(high Dose 65+) 10/16/2020, 07/24/2022   Influenza Split 07/24/2011, 08/23/2014   Influenza Whole 08/23/2004, 07/24/2009, 08/06/2010, 08/05/2012   Influenza, High Dose Seasonal PF 08/11/2016, 08/27/2017, 08/02/2018, 09/05/2021, 08/31/2023   Influenza,inj,Quad PF,6+ Mos 08/16/2013, 08/07/2015   Influenza-Unspecified 09/05/2021   PFIZER(Purple Top)SARS-COV-2 Vaccination 12/15/2019, 01/05/2020, 10/25/2020   Pneumococcal Conjugate-13 08/24/2014   Pneumococcal Polysaccharide-23 08/20/2008   Respiratory Syncytial Virus Vaccine,Recomb Aduvanted(Arexvy) 12/30/2022   Td 03/20/2003, 02/28/2013, 04/21/2023   Zoster, Live 09/04/2015    TDAP status: Up to date  Flu Vaccine status: Up to date  Pneumococcal vaccine status: Up to date  Covid-19 vaccine status: Completed vaccines  Qualifies for Shingles Vaccine? Yes   Zostavax completed No   Shingrix Completed?: No.    Education has been provided regarding the importance of this vaccine. Patient has been advised to call insurance company to determine out of pocket expense if they have not yet received  this vaccine. Advised may also receive vaccine at local pharmacy or Health Dept. Verbalized acceptance and understanding.  Screening Tests Health Maintenance  Topic Date Due   Zoster Vaccines- Shingrix (1 of 2) 03/23/1954   COVID-19 Vaccine (4 - 2024-25 season) 09/19/2024 (Originally 07/25/2023)   MAMMOGRAM  01/25/2033 (Originally 01/14/2022)  Medicare Annual Wellness (AWV)  12/27/2024   Colonoscopy  03/31/2026   DTaP/Tdap/Td (4 - Tdap) 04/20/2033   Pneumonia Vaccine 58+ Years old  Completed   INFLUENZA VACCINE  Completed   DEXA SCAN  Completed   HPV VACCINES  Aged Out    Health Maintenance  Health Maintenance Due  Topic Date Due   Zoster Vaccines- Shingrix (1 of 2) 03/23/1954    Colorectal cancer screening: No longer required.   Mammogram status: No longer required due to age.  Lung Cancer Screening: (Low Dose CT Chest recommended if Age 57-80 years, 20 pack-year currently smoking OR have quit w/in 15years.) does not qualify.   Lung Cancer Screening Referral: no  Additional Screening:  Hepatitis C Screening: does not qualify; Completed no  Vision Screening: Recommended annual ophthalmology exams for early detection of glaucoma and other disorders of the eye. Is the patient up to date with their annual eye exam?  Yes  Who is the provider or what is the name of the office in which the patient attends annual eye exams? Dr Jaye If pt is not established with a provider, would they like to be referred to a provider to establish care? No .   Dental Screening: Recommended annual dental exams for proper oral hygiene  Diabetic Foot Exam: n/a  Community Resource Referral / Chronic Care Management: CRR required this visit?  No   CCM required this visit?  Appt scheduled with PCP for CPE    Plan:     I have personally reviewed and noted the following in the patient's chart:   Medical and social history Use of alcohol, tobacco or illicit drugs  Current medications and  supplements including opioid prescriptions. Patient is not currently taking opioid prescriptions. Functional ability and status Nutritional status Physical activity Advanced directives List of other physicians Hospitalizations, surgeries, and ER visits in previous 12 months Vitals Screenings to include cognitive, depression, and falls Referrals and appointments  In addition, I have reviewed and discussed with patient certain preventive protocols, quality metrics, and best practice recommendations. A written personalized care plan for preventive services as well as general preventive health recommendations were provided to patient.    Erminio LITTIE Saris, LPN   05/27/7973   After Visit Summary: (MyChart) Due to this being a telephonic visit, the after visit summary with patients personalized plan was offered to patient via MyChart   Nurse Notes: The patient states they are doing good other than unable to get rid of a UTI. She has no concerns or questions at this time.

## 2023-12-28 NOTE — Progress Notes (Signed)
Office Visit Note  Patient: Tammy Manning             Date of Birth: Dec 29, 1934           MRN: [id number]             PCP: [name], MD Referring: [name], Audrie Gallus, MD Visit Date: 01/11/2024   Subjective:  Follow-up    Discussed the use of AI scribe software for clinical note transcription with the patient, who gave verbal consent to proceed.  History of Present Illness   Tammy Manning is a 89 y.o. female here for follow up for seropositive RA on methotrexate 15 mg subcu weekly.    She is currently on methotrexate 0.6 mL (15 mg) for rheumatoid arthritis, which effectively manages her symptoms, particularly shoulder pain. However, she dislikes the medication due to hair loss and is interested in potentially reducing the dosage.  She has a persistent urinary tract infection and is on a continuous antibiotic regimen with trimethoprim. Despite this, she reports no significant issues with the antibiotic treatment.  She recently had a sinus infection treated with a heavy antibiotic, which has since resolved. No further treatment is necessary at this time.  She sustained a bruise and skin abrasion on her leg from a cart accident. She managed the wound herself and notes that it is healing without signs of infection. She is on Eliquis, which contributes to bruising.  She takes B12 shots monthly and uses Centrum after fifty vitamins, which do not contain iron, as recommended by her daughter who had breast cancer. She has been unable to take folic acid due to previous issues.   Previous HPI 10/08/2023 The patient, an 89 year old with a history of chronic urinary tract infections (UTIs), rheumatoid arthritis, and atrial fibrillation, presents for follow-up for seropositive RA on methotrexate 15 mg subcu weekly. They report a recent bout of COVID-19, which was slow to resolve and required them to temporarily discontinue methotrexate. During this period, they experienced a resurgence of  joint pain, particularly in the shoulder. Upon resuming methotrexate, the patient reports significant improvement in their symptoms.   She remains on indefinite antibiotics for chronic UTI. They are currently on a daily low-dose antibiotic regimen for maintenance.   The patient also mentions some joint pain in the right hand and shoulder, which was more pronounced when they were off methotrexate during their COVID-19 illness. They report minimal morning stiffness and are able to maintain their daily routine, including walking their dog.  She had recent repeat knee injections with her orthopedic clinic for chronic osteoarthritis pain.   Recent CBC and CMP checked in October WBC mildly elevated 11.4 metabolic panel was normal.   Previous HPI 07/07/2023 Tammy Manning is a 89 y.o. female here for follow up for seropositive RA on methotrexate 15 mg subcu weekly.  Joint symptoms have been pretty stable without serious exacerbation or swelling. She has pain in both hips and had a steroid injection on the right side about one month ago with a good improvement. Left side is bothering her currently but not as badly as sometimes. Noticeable both at night and when walking.  Has ongoing problems since the colitis with constipation sometimes for a week and then abdominal pain and diarrhea. Also with chronic UTI with frequency and incontinence but no painful urination or visible abnormality.   Previous HPI 03/30/2023 Tammy Manning is a 89 y.o. female here for follow up for seropositive RA on methotrexate 15 mg subcu weekly.  Not on folic acid due to intolerance of oral supplement.  Overall arthritis symptoms are doing well.  She had recent bilateral knee steroid injections with Dr. Dion Saucier with good improvement.  Currently no active complaint about the neck shoulder or hand pain.  She had an episode of colitis no specific causative infection or inflammatory process identified.  This has resolved but is now  scheduled for colonoscopy on Thursday.     Previous HPI 12/30/22 Tammy Manning is a 89 y.o. female here for follow up for seropositive RA on methotrexate 15 mg subcu weekly and folic acid 1 mg daily.  Since her last visit she still had some ongoing pain most persistently in the neck and shoulders bilaterally but without any severe flareup compared to before starting treatment.  She has been experiencing more pain affecting her left knee where there is known severe osteoarthritis.  She started taking a lot of Tylenol about 3 times every day 3 to 4 weeks ago but started to develop right-sided flank pain and diarrhea with yellow discolored stools.  She decreased to taking Tylenol no more than once daily with improvement of the symptoms.  She saw Dr. Dion Saucier with intra-articular steroid injection 2 weeks ago with a good improvement in symptoms.  She has not had any significant interval infections.     Previous HPI 03/09/22 Tammy Manning is a 89 y.o. female here for follow up for seropositive RA on MTX 15 mg [place] weekly. She is doing very well since last visit joint pains almost all better except bilateral hips bothering her intermittently. Left knee also creaky but not much inflammation problem. She has persistent stomach irritation, not much relief with taking pepcid as needed. [facility] not able to fill methot[facility]ate Rx from supply issue but she still had medication on hand. She saw urology recently with persistent abnormal UA and cultures but mostly asymptomatic.   Previous HPI 12/29/21 Tammy Manning is a 89 y.o. female here for follow up for RA after restarting methotrexate at 15 mg [place] weekly, which had been delayed due to repeated interruptions from UTI infections and antibiotic treatment. She has noticed improvement in joint symptoms after about 2 weeks taking the methotrexate. She continues to have some nausea when she takes this despite subcutaneous route.   Previous  HPI 11/25/20 Tammy Manning is a 89 y.o. female with a history of afib, TIA, and OA here for evaluation of joint pain and positive rheumatoid factor. She has joint pain in multiple sites including both hands but also has a lot of pain with the right shoulder that started more acutely with suspected rotator cuff tear. 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 She does notice swelling in hands intermittently. She has morning stiffness lasting less than 30 minutes daily also persistent pain throughout the day. She has had left radius fracture with surgical repair but no other major surgery of her arms.   Review of Systems  Constitutional:  Positive for fatigue.  HENT:  Positive for mouth sores and mouth dryness.   Eyes:  Positive for dryness.  Respiratory:  Negative for shortness of breath.   Cardiovascular:  Negative for chest pain and palpitations.  Gastrointestinal:  Negative for blood in stool, constipation and diarrhea.  Endocrine: Negative for increased urination.  Genitourinary:  Positive for involuntary urination.  Musculoskeletal:  Positive for muscle weakness and morning stiffness. Negative for joint pain, gait problem, joint pain,  joint swelling, myalgias, muscle tenderness and myalgias.  Skin:  Positive for hair loss. Negative for color change, rash and sensitivity to sunlight.  Allergic/Immunologic: Positive for susceptible to infections.  Neurological:  Positive for dizziness. Negative for headaches.  Hematological:  Negative for swollen glands.  Psychiatric/Behavioral:  Negative for depressed mood and sleep disturbance. The patient is not nervous/anxious.     PMFS History:  Patient Active Problem List   Diagnosis Date Noted   Abrasion of knee 11/26/2023   Anxious mood 09/20/2023   Tremor of both hands 09/20/2023   Viral URI with cough 07/21/2023   Abrasion of left arm 04/28/2023   Head injury 04/28/2023   Superficial  injury of skin 04/21/2023   Dysuria 04/12/2023   Diverticulosis 03/21/2023   Acute colitis 03/13/2023   Chronic diastolic CHF (congestive heart failure) (HCC) 03/13/2023   Obesity (BMI 30-39.9) 03/13/2023   PAF (paroxysmal atrial fibrillation) (HCC) 03/13/2023   Recurrent UTI (urinary tract infection) 02/18/2023   Intertrigo 02/16/2023   Hemorrhoids 02/02/2023   Urinary incontinence 02/02/2023   Acute cystitis 01/26/2023   Dilated aortic root (HCC) 01/26/2023   Osteopenia 01/26/2023   Multinodular goiter 02/27/2022   Encounter for screening mammogram for breast cancer 01/23/2022   High risk medication use 10/07/2021   Atrial fibrillation with RVR (HCC) 04/28/2021   Hypertrophic toenail 04/22/2021   Seropositive rheumatoid arthritis (HCC) 11/25/2020   Bilateral hand pain 11/25/2020   Bilateral shoulder pain 10/16/2020   Joint pain 10/16/2020   Acute sinusitis 10/09/2020   Estrogen deficiency 04/30/2020   Dry mouth 03/15/2020   Osteoarthritis of right hip 01/02/2020   Right thyroid nodule 05/12/2018   History of TIA (transient ischemic attack) 05/09/2018   Aortic insufficiency 09/10/2015   Stress reaction 09/08/2015   Encounter for Medicare annual wellness exam 02/28/2013   Bucket-handle tear of lateral meniscus of left knee as current injury 12/30/2012   Episodic atrial fibrillation (HCC) 11/17/2012   Hearing loss of both ears 01/14/2012   B12 deficiency 03/17/2010   GERD (gastroesophageal reflux disease) 03/17/2010   POSTMENOPAUSAL STATUS 08/20/2008   Essential hypertension 07/19/2007   Hyperlipidemia 06/24/2007   IBS 06/24/2007   DERMATITIS, ATOPIC 06/24/2007   SKIN CANCER, HX OF 06/24/2007    Past Medical History:  Diagnosis Date   A-fib (HCC)    Anginal pain (HCC)    Aortic insufficiency    a. 02/2020 Echo: EF 60-65%, no rwma, Gr1 DD, nl RV fxn, mildly dil LA, triv AI, Asc Ao 39mm; b. 04/2021 Echo: EF 60-65%, no rwma, GrI DD, nl RV fxn, mildly dil LA, AI not  visualized. Mild-mod AoV sclerosis w/o stenosis.   Arthritis    Knees   B12 deficiency    Mild   Bucket-handle tear of lateral meniscus of left knee as current injury 12/30/2012   Cancer (HCC)    Skin, basal cell on nose and eyelid   Colitis    COVID    DJD (degenerative joint disease)    Low back   Dyspepsia    GERD (gastroesophageal reflux disease)    Heart murmur    Heart palpitations    History of cardiac cath    a. 02/2017 Cath: Nl cors. Nl LV fxn. Abd Aogram w/o RAS.   HOH (hard of hearing)    Hyperlipidemia    Hypertension    IBS (irritable bowel syndrome)    Lactose intolerance    Mixed incontinence    PONV (postoperative nausea and vomiting)    Urinary  tract infection    Vulvar irritation    from urine pad, uses Triamcinolone oint.    Family History  Problem Relation Age of Onset   Cancer Mother        Colon   Heart disease Father        CAD   Diabetes Sister    Hypothyroidism Sister    Diabetes Sister    Alzheimer's disease Sister    Diabetes Brother    Leukemia Brother    Esophageal cancer Brother    Diabetes Brother    Cancer Paternal Aunt        Breast   Breast cancer Daughter    Past Surgical History:  Procedure Laterality Date   ABDOMINAL AORTOGRAM N/A 03/15/2017   Procedure: Abdominal Aortogram;  Surgeon: Iran Ouch, MD;  Location: [facility];  Service: Cardiovascular;  Laterality: N/A;   ABDOMINAL HYSTERECTOMY  1982   Large fibroids and bleeding   APPENDECTOMY     BILATERAL SALPINGOOPHORECTOMY  1986   BONE GRAFT HIP ILIAC CREST     To Left arm   BREAST REDUCTION SURGERY     BREAST SURGERY     CARDIAC CATHETERIZATION     CATARACT EXTRACTION W/PHACO Left 07/07/2016   Procedure: CATARACT EXTRACTION PHACO AND INTRAOCULAR LENS PLACEMENT (IOC);  Surgeon: Galen Manila, MD;  Location: [facility];  Service: Ophthalmology;  Laterality: Left;  [place] 01:10 AP% 18.3 CDE 12.91 Fluid pak lot # [id number]   CATARACT EXTRACTION W/PHACO Right  07/28/2016   Procedure: CATARACT EXTRACTION PHACO AND INTRAOCULAR LENS PLACEMENT (IOC);  Surgeon: Galen Manila, MD;  Location: [facility];  Service: Ophthalmology;  Laterality: Right;  [place] 01:26 AP% 18.5 CDE 16.12 Fluid pack lot # [id number]   COLONOSCOPY  07/2016   Dr. Lottie Mussel   COLONOSCOPY WITH PROPOFOL N/A 04/01/2023   Procedure: COLONOSCOPY WITH PROPOFOL;  Surgeon: Toney Reil, MD;  Location: [facility];  Service: Gastroenterology;  Laterality: N/A;   DILATION AND CURETTAGE OF UTERUS     miscarragesx2   ESOPHAGOGASTRODUODENOSCOPY     Polyps   HEMORRHOID SURGERY  2006   KNEE ARTHROSCOPY WITH LATERAL MENISECTOMY Left 12/30/2012   Procedure: KNEE ARTHROSCOPY WITH LATERAL MENISECTOMY;  Surgeon: Eulas Post, MD;  Location: [facility];  Service: Orthopedics;  Laterality: Left;  LEFT KNEE SCOPE LATERAL MENISCECTOMY   LEFT HEART CATH AND CORONARY ANGIOGRAPHY N/A 03/15/2017   Procedure: Left Heart Cath and Coronary Angiography;  Surgeon: Iran Ouch, MD;  Location: [facility];  Service: Cardiovascular;  Laterality: N/A;   MOUTH SURGERY     skin cancer eyelid  2012   TONSILLECTOMY     Social History   Social History Narrative   Wellsite geologist.     Immunization History  Administered Date(s) Administered   Fluad Quad(high Dose 65+) 10/16/2020, 07/24/2022   Influenza Split 07/24/2011, 08/23/2014   Influenza Whole 08/23/2004, 07/24/2009, 08/06/2010, 08/05/2012   Influenza, High Dose Seasonal PF 08/11/2016, 08/27/2017, 08/02/2018, 09/05/2021, 08/31/2023   Influenza,inj,Quad PF,6+ Mos 08/16/2013, 08/07/2015   Influenza-Unspecified 09/05/2021   PFIZER(Purple Top)SARS-COV-2 Vaccination 12/15/2019, 01/05/2020, 10/25/2020   Pneumococcal Conjugate-13 08/24/2014   Pneumococcal Polysaccharide-23 08/20/2008   Respiratory Syncytial Virus Vaccine,Recomb Aduvanted(Arexvy) 12/30/2022   Td 03/20/2003, 02/28/2013, 04/21/2023   Zoster, Live 09/04/2015      Objective: Vital Signs: BP 129/75 (BP Location: Left Arm, Patient Position: Sitting, Cuff Size: Large)   Pulse 80   Resp 14   Ht 5' 4.5" (1.638 m)  Wt 175 lb (79.4 kg)   BMI 29.57 kg/m    Physical Exam Eyes:     Conjunctiva/sclera: Conjunctivae normal.  Cardiovascular:     Rate and Rhythm: Normal rate and regular rhythm.  Pulmonary:     Effort: Pulmonary effort is normal.     Breath sounds: Normal breath sounds.  Lymphadenopathy:     Cervical: No cervical adenopathy.  Skin:    General: Skin is warm and dry.     Findings: Bruising present.     Comments: Mild bruising on bilateral hands and forearms, right shin with large skin tears and surrounding bruising, no erythema or induration  Neurological:     Mental Status: She is alert.  Psychiatric:        Mood and Affect: Mood normal.      Musculoskeletal Exam:  Shoulders full ROM no tenderness or swelling Elbows full ROM no tenderness or swelling Wrists full ROM no tenderness or swelling Fingers full ROM, heberdon's nodes b/l no tenderness or synovitis Knees full ROM no tenderness or swelling Ankles full ROM no tenderness or swelling   Investigation: No additional findings.  Imaging: No results found.  Recent Labs: Lab Results  Component Value Date   WBC 11.4 (H) 09/09/2023   HGB 11.6 (L) 09/09/2023   PLT 331 09/09/2023   NA 134 (L) 09/09/2023   K 4.0 09/09/2023   CL 102 09/09/2023   CO2 23 09/09/2023   GLUCOSE 100 (H) 09/09/2023   BUN 16 09/09/2023   CREATININE 0.93 09/09/2023   BILITOT 1.0 09/09/2023   ALKPHOS 52 09/09/2023   AST 16 09/09/2023   ALT 15 09/09/2023   PROT 6.5 09/09/2023   ALBUMIN 3.5 09/09/2023   CALCIUM 9.7 09/09/2023   GFRAA 71 02/02/2020    Speciality Comments: No specialty comments available.  Procedures:  No procedures performed Allergies: Antihistamines, chlorpheniramine-type; Atenolol; Cefuroxime axetil; Ciprofloxacin; Co q 10 [coenzyme q10]; Crestor [rosuvastatin  calcium]; Dextromethorphan-guaifenesin; Elavil [amitriptyline]; Epinephrine; Methenamine; Ramipril; Sulfamethoxazole-trimethoprim; and Vitamin d analogs   Assessment / Plan:     Visit Diagnoses: Seropositive rheumatoid arthritis (HCC) - Plan: Sedimentation rate Well controlled on Methotrexate with no major flare-ups. -Checking sed rate for disease activity monitoring -Consider reducing Methotrexate dose to 10mg  (0.33ml) if inflammatory markers normal  High risk medication use - methotrexate 15 mg subcu weekly. Intolerant to folic acid supplement. - Plan: CBC with Differential/Platelet, COMPLETE METABOLIC PANEL WITH GFR Tolerating medication okay.  She does feel there is associated hair thinning and is making her easy bruising worse.  No serious interval infections does have the chronic UTI on antibiotics. She has concerns about driving herself to [place] at some point in the future versus transitioning care to more local in [place]. -Checking CBC and CMP for medication monitoring on continued use of methotrexate  Dysuria - Sees [facility]. Long-term trimethoprim 100 mg daily Urinary Tract Infection Chronic infection, currently on suppressive antibiotic therapy trimethoprim  Leg Bruise Result of a minor accident at home, no signs of infection. -Advise to monitor for signs of infection (increased drainage, redness, swelling). -Consider using non-stick gauze and elastic wrap if adhesive bandages are causing skin irritation.    Orders: Orders Placed This Encounter  Procedures   Sedimentation rate   CBC with Differential/Platelet   COMPLETE METABOLIC PANEL WITH GFR   No orders of the defined types were placed in this encounter.    Follow-Up Instructions: Return in about 3 months (around 04/09/2024) for RA on MTX f/u 3mos.  Fuller Plan, MD  Note - This record has been created using [employer].  Chart creation errors have been sought, but may not always  have  been located. Such creation errors do not reflect on  the standard of medical care.

## 2024-01-04 ENCOUNTER — Ambulatory Visit (INDEPENDENT_AMBULATORY_CARE_PROVIDER_SITE_OTHER): Payer: Medicare Other

## 2024-01-04 DIAGNOSIS — E538 Deficiency of other specified B group vitamins: Secondary | ICD-10-CM

## 2024-01-04 MED ORDER — CYANOCOBALAMIN 1000 MCG/ML IJ SOLN
1000.0000 ug | Freq: Once | INTRAMUSCULAR | Status: AC
  Administered 2024-01-04: 1000 ug via INTRAMUSCULAR

## 2024-01-04 NOTE — Progress Notes (Signed)
Per orders of Dr. Roxy Manns, injection of vitamin b 12 given by Lewanda Rife in left deltoid. Patient tolerated injection well. Patient will make appointment for 1 month.

## 2024-01-11 ENCOUNTER — Encounter: Payer: Self-pay | Admitting: Internal Medicine

## 2024-01-11 ENCOUNTER — Ambulatory Visit: Payer: Medicare Other | Attending: Internal Medicine | Admitting: Internal Medicine

## 2024-01-11 VITALS — BP 129/75 | HR 80 | Resp 14 | Ht 64.5 in | Wt 175.0 lb

## 2024-01-11 DIAGNOSIS — M059 Rheumatoid arthritis with rheumatoid factor, unspecified: Secondary | ICD-10-CM

## 2024-01-11 DIAGNOSIS — R300 Dysuria: Secondary | ICD-10-CM

## 2024-01-11 DIAGNOSIS — Z79899 Other long term (current) drug therapy: Secondary | ICD-10-CM | POA: Diagnosis not present

## 2024-01-12 LAB — CBC WITH DIFFERENTIAL/PLATELET
Absolute Lymphocytes: 2737 {cells}/uL (ref 850–3900)
Absolute Monocytes: 1254 {cells}/uL — ABNORMAL HIGH (ref 200–950)
Basophils Absolute: 35 {cells}/uL (ref 0–200)
Basophils Relative: 0.3 %
Eosinophils Absolute: 69 {cells}/uL (ref 15–500)
Eosinophils Relative: 0.6 %
HCT: 34.6 % — ABNORMAL LOW (ref 35.0–45.0)
Hemoglobin: 11.3 g/dL — ABNORMAL LOW (ref 11.7–15.5)
MCH: 33.1 pg — ABNORMAL HIGH (ref 27.0–33.0)
MCHC: 32.7 g/dL (ref 32.0–36.0)
MCV: 101.5 fL — ABNORMAL HIGH (ref 80.0–100.0)
MPV: 9.4 fL (ref 7.5–12.5)
Monocytes Relative: 10.9 %
Neutro Abs: 7406 {cells}/uL (ref 1500–7800)
Neutrophils Relative %: 64.4 %
Platelets: 348 10*3/uL (ref 140–400)
RBC: 3.41 10*6/uL — ABNORMAL LOW (ref 3.80–5.10)
RDW: 15.3 % — ABNORMAL HIGH (ref 11.0–15.0)
Total Lymphocyte: 23.8 %
WBC: 11.5 10*3/uL — ABNORMAL HIGH (ref 3.8–10.8)

## 2024-01-12 LAB — COMPLETE METABOLIC PANEL WITH GFR
AG Ratio: 2.1 (calc) (ref 1.0–2.5)
ALT: 14 U/L (ref 6–29)
AST: 17 U/L (ref 10–35)
Albumin: 3.9 g/dL (ref 3.6–5.1)
Alkaline phosphatase (APISO): 38 U/L (ref 37–153)
BUN: 22 mg/dL (ref 7–25)
CO2: 24 mmol/L (ref 20–32)
Calcium: 9.9 mg/dL (ref 8.6–10.4)
Chloride: 104 mmol/L (ref 98–110)
Creat: 0.88 mg/dL (ref 0.60–0.95)
Globulin: 1.9 g/dL (ref 1.9–3.7)
Glucose, Bld: 77 mg/dL (ref 65–99)
Potassium: 5.1 mmol/L (ref 3.5–5.3)
Sodium: 137 mmol/L (ref 135–146)
Total Bilirubin: 0.5 mg/dL (ref 0.2–1.2)
Total Protein: 5.8 g/dL — ABNORMAL LOW (ref 6.1–8.1)
eGFR: 64 mL/min/{1.73_m2} (ref >=60)

## 2024-01-12 LAB — SEDIMENTATION RATE: Sed Rate: 9 mm/h (ref 0–30)

## 2024-01-19 DIAGNOSIS — M25551 Pain in right hip: Secondary | ICD-10-CM | POA: Diagnosis not present

## 2024-01-27 ENCOUNTER — Ambulatory Visit: Payer: Medicare Other | Admitting: Family Medicine

## 2024-01-27 ENCOUNTER — Other Ambulatory Visit: Payer: Self-pay | Admitting: Internal Medicine

## 2024-01-27 ENCOUNTER — Encounter: Payer: Self-pay | Admitting: Family Medicine

## 2024-01-27 VITALS — BP 110/60 | HR 65 | Temp 98.2°F | Ht 63.0 in | Wt 169.8 lb

## 2024-01-27 DIAGNOSIS — B370 Candidal stomatitis: Secondary | ICD-10-CM | POA: Diagnosis not present

## 2024-01-27 DIAGNOSIS — I10 Essential (primary) hypertension: Secondary | ICD-10-CM | POA: Diagnosis not present

## 2024-01-27 DIAGNOSIS — Z131 Encounter for screening for diabetes mellitus: Secondary | ICD-10-CM | POA: Insufficient documentation

## 2024-01-27 DIAGNOSIS — N390 Urinary tract infection, site not specified: Secondary | ICD-10-CM

## 2024-01-27 DIAGNOSIS — I5032 Chronic diastolic (congestive) heart failure: Secondary | ICD-10-CM | POA: Diagnosis not present

## 2024-01-27 DIAGNOSIS — F419 Anxiety disorder, unspecified: Secondary | ICD-10-CM | POA: Diagnosis not present

## 2024-01-27 DIAGNOSIS — E538 Deficiency of other specified B group vitamins: Secondary | ICD-10-CM

## 2024-01-27 DIAGNOSIS — M059 Rheumatoid arthritis with rheumatoid factor, unspecified: Secondary | ICD-10-CM

## 2024-01-27 DIAGNOSIS — E785 Hyperlipidemia, unspecified: Secondary | ICD-10-CM | POA: Diagnosis not present

## 2024-01-27 DIAGNOSIS — M8580 Other specified disorders of bone density and structure, unspecified site: Secondary | ICD-10-CM

## 2024-01-27 DIAGNOSIS — E669 Obesity, unspecified: Secondary | ICD-10-CM

## 2024-01-27 DIAGNOSIS — I480 Paroxysmal atrial fibrillation: Secondary | ICD-10-CM

## 2024-01-27 LAB — LIPID PANEL
Cholesterol: 229 mg/dL — ABNORMAL HIGH (ref 0–200)
HDL: 88.3 mg/dL (ref >39.00)
LDL Cholesterol: 111 mg/dL — ABNORMAL HIGH (ref 0–99)
NonHDL: 141.06
Total CHOL/HDL Ratio: 3
Triglycerides: 150 mg/dL — ABNORMAL HIGH (ref 0.0–149.0)
VLDL: 30 mg/dL (ref 0.0–40.0)

## 2024-01-27 LAB — VITAMIN B12: Vitamin B-12: 501 pg/mL (ref 211–911)

## 2024-01-27 LAB — HEMOGLOBIN A1C: Hgb A1c MFr Bld: 6 % (ref 4.6–6.5)

## 2024-01-27 LAB — TSH: TSH: 0.85 u[IU]/mL (ref 0.35–5.50)

## 2024-01-27 MED ORDER — NYSTATIN 100000 UNIT/ML MT SUSP: 5.0000 mL | Freq: Three times a day (TID) | OROMUCOSAL | 1 refills | Status: DC

## 2024-01-27 NOTE — Assessment & Plan Note (Signed)
 Continues rheum care  Methotrexate  Encouraged to keep up with vaccines

## 2024-01-27 NOTE — Assessment & Plan Note (Signed)
 Conitnues cardiology care  Cartia xt  Eliquis

## 2024-01-27 NOTE — Assessment & Plan Note (Signed)
 Disc goals for lipids and reasons to control them Rev last labs with pt Rev low sat fat diet in detail Due for labs today  Atorvastatin 10 mg daily

## 2024-01-27 NOTE — Assessment & Plan Note (Signed)
 A1c ordered in light of bmi over 30  disc imp of low glycemic diet and wt loss to prevent DM2

## 2024-01-27 NOTE — Assessment & Plan Note (Signed)
 Doing well  Good GAD 7 score  Tranxene prn with stressors   Encouraged good self care

## 2024-01-27 NOTE — Progress Notes (Signed)
 Subjective:    Patient ID: Tammy Manning, female    DOB: December 17, 1934, 89 y.o.   MRN: [id number]  HPI  Pt presents for annual follow up of chronic medical problems    Wt Readings from Last 3 Encounters:  01/27/24 169 lb 12.8 oz (77 kg)  01/11/24 175 lb (79.4 kg)  12/28/23 169 lb (76.7 kg)   30.08 kg/m  Vitals:   01/27/24 1048  BP: 110/60  Pulse: 65  Temp: 98.2 F (36.8 C)  SpO2: 97%    Immunization History  Administered Date(s) Administered   Fluad Quad(high Dose 65+) 10/16/2020, 07/24/2022   Influenza Split 07/24/2011, 08/23/2014   Influenza Whole 08/23/2004, 07/24/2009, 08/06/2010, 08/05/2012   Influenza, High Dose Seasonal PF 08/11/2016, 08/27/2017, 08/02/2018, 09/05/2021, 08/31/2023   Influenza,inj,Quad PF,6+ Mos 08/16/2013, 08/07/2015   Influenza-Unspecified 09/05/2021   PFIZER(Purple Top)SARS-COV-2 Vaccination 12/15/2019, 01/05/2020, 10/25/2020   Pneumococcal Conjugate-13 08/24/2014   Pneumococcal Polysaccharide-23 08/20/2008   Respiratory Syncytial Virus Vaccine,Recomb Aduvanted(Arexvy) 12/30/2022   Td 03/20/2003, 02/28/2013, 04/21/2023   Zoster, Live 09/04/2015    Health Maintenance Due  Topic Date Due   Zoster Vaccines- Shingrix (1 of 2) 03/23/1956   Shingrix -wants to get it  Had zostavax in distant past  Had the RSV vaccine   Mammogram 12/2020- declines further screening  Self breast exam-no lumps  Daughter with breast cancer  P aunt breast cance r  Gyn health Started yeast infection symptoms since taking amoxicillin  Over the counter med helped   Now thinks she has thrush  Needs refill of nystatin     Colon cancer screening  colonoscopy was 03/2023 (has had 3 y recall depending on health at her age) history of colitis in past  Fam history of colon cancer in parent   Bone health  Dexa  05/2020 mild osteopenia -declines another one  Falls-none  [employer] ca and D  Last vitamin D Lab Results  Component Value Date    VD25OH 33 08/22/2009    Exercise  Limited /hips bother her  Has RA  Does her own house work mostly    Mood    01/27/2024   10:56 AM 12/28/2023    1:59 PM 11/26/2023    2:08 PM 07/21/2023    3:52 PM 03/19/2023   11:37 AM  Depression screen PHQ 2/9  Decreased Interest 0 0 0 0 0  Down, Depressed, Hopeless 1 0 0 0 0  PHQ - 2 Score 1 0 0 0 0  Altered sleeping 0  0 0 0  Tired, decreased energy 1  0 0 2  Change in appetite 0  0 0 3  Feeling bad or failure about yourself  0  0 0 0  Trouble concentrating 0  0 0 1  Moving slowly or fidgety/restless 0  0 0 0  Suicidal thoughts 0  0 0 0  PHQ-9 Score 2  0 0 6  Difficult doing work/chores Not difficult at all Not difficult at all Not difficult at all Not difficult at all Somewhat difficult      01/27/2024   10:58 AM 11/26/2023    2:08 PM 07/21/2023    3:52 PM 03/19/2023   11:37 AM  GAD 7 : Generalized Anxiety Score  Nervous, Anxious, on Edge 0 0 0 1  Control/stop worrying 0 0 0 0  Worry too much - different things 0 0 0 0  Trouble relaxing 0 0 0 0  Restless 0 0 0 0  Easily annoyed or irritable  0 0 0 0  Afraid - awful might happen 0 0 0   Total GAD 7 Score 0 0 0   Anxiety Difficulty Not difficult at all Not difficult at all Not difficult at all Somewhat difficult     HTN bp is stable today  No cp or palpitations or headaches or edema  No side effects to medicines  BP Readings from Last 3 Encounters:  01/27/24 110/60  01/11/24 129/75  11/26/23 116/62    Cartia XT 180 mg daily  Avapro 300 mg daily   Eliquis for a fib   Pulse Readings from Last 3 Encounters:  01/27/24 65  01/11/24 80  11/26/23 76   Lab Results  Component Value Date   NA 137 01/11/2024   K 5.1 01/11/2024   CO2 24 01/11/2024   GLUCOSE 77 01/11/2024   BUN 22 01/11/2024   CREATININE 0.88 01/11/2024   CALCIUM 9.9 01/11/2024   GFR 79.38 03/19/2023   EGFR 64 01/11/2024   GFRNONAA 60 (L) 09/09/2023   Lab Results  Component Value Date   ALT 14 01/11/2024    Manning 17 01/11/2024   ALKPHOS 52 09/09/2023   BILITOT 0.5 01/11/2024     History of goiter Lab Results  Component Value Date   TSH 0.705 09/09/2023   RA Rheum care Lab Results  Component Value Date   WBC 11.5 (H) 01/11/2024   HGB 11.3 (L) 01/11/2024   HCT 34.6 (L) 01/11/2024   MCV 101.5 (H) 01/11/2024   PLT 348 01/11/2024   Recurrent uti  On trimethoprim 100 mg daily   B12 def Lab Results  Component Value Date   VITAMINB12 333 01/27/2023   Gets shot monthly  Hyperlipidemia Lab Results  Component Value Date   CHOL 204 (H) 01/27/2023   HDL 88.50 01/27/2023   LDLCALC 95 01/27/2023   LDLDIRECT 106 (H) 02/02/2020   TRIG 102.0 01/27/2023   CHOLHDL 2 01/27/2023  Atorvastatin 10 mg daily   Lab Results  Component Value Date   HGBA1C 5.7 (H) 05/10/2018       Patient Active Problem List   Diagnosis Date Noted   Diabetes mellitus screening 01/27/2024   Thrush 01/27/2024   Anxious mood 09/20/2023   Tremor of both hands 09/20/2023   Head injury 04/28/2023   Superficial injury of skin 04/21/2023   Diverticulosis 03/21/2023   Chronic diastolic heart failure (HCC) 03/13/2023   Obesity (BMI 30-39.9) 03/13/2023   PAF (paroxysmal atrial fibrillation) (HCC) 03/13/2023   Recurrent UTI (urinary tract infection) 02/18/2023   Intertrigo 02/16/2023   Hemorrhoids 02/02/2023   Urinary incontinence 02/02/2023   Dilated aortic root (HCC) 01/26/2023   Osteopenia 01/26/2023   Multinodular goiter 02/27/2022   Encounter for screening mammogram for breast cancer 01/23/2022   High risk medication use 10/07/2021   Atrial fibrillation with RVR (HCC) 04/28/2021   Hypertrophic toenail 04/22/2021   Seropositive rheumatoid arthritis (HCC) 11/25/2020   Bilateral hand pain 11/25/2020   Bilateral shoulder pain 10/16/2020   Joint pain 10/16/2020   Estrogen deficiency 04/30/2020   Dry mouth 03/15/2020   Osteoarthritis of right hip 01/02/2020   Right thyroid nodule 05/12/2018   History  of TIA (transient ischemic attack) 05/09/2018   Aortic insufficiency 09/10/2015   Stress reaction 09/08/2015   Encounter for Medicare annual wellness exam 02/28/2013   Bucket-handle tear of lateral meniscus of left knee as current injury 12/30/2012   Episodic atrial fibrillation (HCC) 11/17/2012   Hearing loss of both ears 01/14/2012  B12 deficiency 03/17/2010   GERD (gastroesophageal reflux disease) 03/17/2010   POSTMENOPAUSAL STATUS 08/20/2008   Essential hypertension 07/19/2007   Hyperlipidemia 06/24/2007   IBS 06/24/2007   DERMATITIS, ATOPIC 06/24/2007   SKIN CANCER, HX OF 06/24/2007   Past Medical History:  Diagnosis Date   A-fib (HCC)    Anginal pain (HCC)    Aortic insufficiency    a. 02/2020 Echo: EF 60-65%, no rwma, Gr1 DD, nl RV fxn, mildly dil LA, triv AI, Asc Ao 39mm; b. 04/2021 Echo: EF 60-65%, no rwma, GrI DD, nl RV fxn, mildly dil LA, AI not visualized. Mild-mod AoV sclerosis w/o stenosis.   Arthritis    Knees   B12 deficiency    Mild   Bucket-handle tear of lateral meniscus of left knee as current injury 12/30/2012   Cancer (HCC)    Skin, basal cell on nose and eyelid   Colitis    COVID    DJD (degenerative joint disease)    Low back   Dyspepsia    GERD (gastroesophageal reflux disease)    Heart murmur    Heart palpitations    History of cardiac cath    a. 02/2017 Cath: Nl cors. Nl LV fxn. Abd Aogram w/o RAS.   HOH (hard of hearing)    Hyperlipidemia    Hypertension    IBS (irritable bowel syndrome)    Lactose intolerance    Mixed incontinence    PONV (postoperative nausea and vomiting)    Urinary tract infection    Vulvar irritation    from urine pad, uses Triamcinolone oint.   Past Surgical History:  Procedure Laterality Date   ABDOMINAL AORTOGRAM N/A 03/15/2017   Procedure: Abdominal Aortogram;  Surgeon: Iran Ouch, MD;  Location: [facility];  Service: Cardiovascular;  Laterality: N/A;   ABDOMINAL HYSTERECTOMY  1982   Large  fibroids and bleeding   APPENDECTOMY     BILATERAL SALPINGOOPHORECTOMY  1986   BONE GRAFT HIP ILIAC CREST     To Left arm   BREAST REDUCTION SURGERY     BREAST SURGERY     CARDIAC CATHETERIZATION     CATARACT EXTRACTION W/PHACO Left 07/07/2016   Procedure: CATARACT EXTRACTION PHACO AND INTRAOCULAR LENS PLACEMENT (IOC);  Surgeon: Galen Manila, MD;  Location: [facility];  Service: Ophthalmology;  Laterality: Left;  [place] 01:10 AP% 18.3 CDE 12.91 Fluid pak lot # [id number]   CATARACT EXTRACTION W/PHACO Right 07/28/2016   Procedure: CATARACT EXTRACTION PHACO AND INTRAOCULAR LENS PLACEMENT (IOC);  Surgeon: Galen Manila, MD;  Location: [facility];  Service: Ophthalmology;  Laterality: Right;  [place] 01:26 AP% 18.5 CDE 16.12 Fluid pack lot # [id number]   COLONOSCOPY  07/2016   Dr. Lottie Mussel   COLONOSCOPY WITH PROPOFOL N/A 04/01/2023   Procedure: COLONOSCOPY WITH PROPOFOL;  Surgeon: Toney Reil, MD;  Location: [facility];  Service: Gastroenterology;  Laterality: N/A;   DILATION AND CURETTAGE OF UTERUS     miscarragesx2   ESOPHAGOGASTRODUODENOSCOPY     Polyps   HEMORRHOID SURGERY  2006   KNEE ARTHROSCOPY WITH LATERAL MENISECTOMY Left 12/30/2012   Procedure: KNEE ARTHROSCOPY WITH LATERAL MENISECTOMY;  Surgeon: Eulas Post, MD;  Location: [facility];  Service: Orthopedics;  Laterality: Left;  LEFT KNEE SCOPE LATERAL MENISCECTOMY   LEFT HEART CATH AND CORONARY ANGIOGRAPHY N/A 03/15/2017   Procedure: Left Heart Cath and Coronary Angiography;  Surgeon: Iran Ouch, MD;  Location: [facility];  Service: Cardiovascular;  Laterality: N/A;  MOUTH SURGERY     skin cancer eyelid  2012   TONSILLECTOMY     Social History   Tobacco Use   Smoking status: Former    Current packs/day: 0.00    Average packs/day: 1 pack/day for 12.0 years (12.0 ttl pk-yrs)    Types: Cigarettes    Start date: 11/24/1951    Quit date: 11/24/1963    Years since quitting: 60.2    Passive  exposure: Never   Smokeless tobacco: Never  Vaping Use   Vaping status: Never Used  Substance Use Topics   Alcohol use: No    Alcohol/week: 0.0 standard drinks of alcohol   Drug use: No   Family History  Problem Relation Age of Onset   Cancer Mother        Colon   Heart disease Father        CAD   Diabetes Sister    Hypothyroidism Sister    Diabetes Sister    Alzheimer's disease Sister    Diabetes Brother    Leukemia Brother    Esophageal cancer Brother    Diabetes Brother    Cancer Paternal Aunt        Breast   Breast cancer Daughter    Allergies  Allergen Reactions   Antihistamines, Chlorpheniramine-Type Other (See Comments)    Reaction:  Makes pt hyper    Atenolol Hypertension   Cefuroxime Axetil Other (See Comments)    Upset stomach   Ciprofloxacin Other (See Comments)    Upset stomach   Co Q 10 [Coenzyme Q10] Other (See Comments)    Reaction:  GI upset    Crestor [Rosuvastatin Calcium] Other (See Comments)    Reaction:  Myalgias    Dextromethorphan-Guaifenesin Other (See Comments)    Reaction:  Made pt jittery    Elavil [Amitriptyline]     Felt worse /tearful    Epinephrine Hives   Methenamine     Tongue discomfort    Ramipril Other (See Comments)    Upset stomach   Sulfamethoxazole-Trimethoprim Other (See Comments)    Upset stomach   Vitamin D Analogs Other (See Comments)    Reaction:  GI upset    Current Outpatient Medications on File Prior to Visit  Medication Sig Dispense Refill   atorvastatin (LIPITOR) 10 MG tablet Take 1 tablet (10 mg total) by mouth daily. 90 tablet 3   Calcium Carbonate-Vitamin D (CALCIUM 600+D PO) Take 1 tablet by mouth daily.      CARTIA XT 180 MG 24 hr capsule TAKE 1 CAPSULE(180 MG) BY MOUTH DAILY 90 capsule 3   clobetasol ointment (TEMOVATE) 0.05 % Apply 1 application topically 2 (two) times daily as needed.     clorazepate (TRANXENE) 7.5 MG tablet Take 1 tablet (7.5 mg total) by mouth daily as needed. 30 tablet 0    cyanocobalamin (,VITAMIN B-12,) 1000 MCG/ML injection Inject 1,000 mcg into the muscle every 30 (thirty) days.      ELIQUIS 5 MG TABS tablet TAKE 1 TABLET BY MOUTH TWICE DAILY 180 tablet 1   estradiol (ESTRACE) 0.1 MG/GM vaginal cream Apply a pea-sized amount to fingertip and wipe in vaginal introitus twice weekly 42.5 g 1   famotidine (PEPCID) 20 MG tablet Take 20 mg by mouth 2 (two) times daily as needed for heartburn or indigestion.     guaiFENesin (MUCINEX) 600 MG 12 hr tablet Take 600 mg by mouth 2 (two) times daily as needed.     hydrocortisone (ANUSOL-HC) 2.5 % rectal cream  Apply 1 Application topically at bedtime. To affected area for 10 days 30 g 0   hydrocortisone (ANUSOL-HC) 25 MG suppository Place 1 suppository (25 mg total) rectally 2 (two) times daily. 10 suppository 0   irbesartan (AVAPRO) 300 MG tablet TAKE 1 TABLET BY MOUTH DAILY 90 tablet 2   loperamide (IMODIUM) 2 MG capsule Take 2 mg by mouth as needed for diarrhea or loose stools.     methotrexate 50 MG/2ML injection Inject 0.6 mLs (15 mg total) into the skin once a week. 4 mL 2   triamcinolone (NASACORT) 55 MCG/ACT AERO nasal inhaler Place 2 sprays into the nose daily. 1 each 12   trimethoprim (TRIMPEX) 100 MG tablet Take 1 tablet (100 mg total) by mouth daily. 30 tablet 2   Tuberculin-Allergy Syringes 27G X 1/2" 1 ML MISC Use 1 syringe once weekly to inject methotrexate. 25 each 1   No current facility-administered medications on file prior to visit.    Review of Systems  Constitutional:  Negative for activity change, appetite change, fatigue, fever and unexpected weight change.  HENT:  Negative for congestion, ear pain, rhinorrhea, sinus pressure and sore throat.   Eyes:  Negative for pain, redness and visual disturbance.  Respiratory:  Negative for cough, shortness of breath and wheezing.   Cardiovascular:  Negative for chest pain and palpitations.  Gastrointestinal:  Negative for abdominal pain, blood in stool,  constipation and diarrhea.  Endocrine: Negative for polydipsia and polyuria.  Genitourinary:  Negative for dysuria, frequency and urgency.  Musculoskeletal:  Positive for arthralgias. Negative for back pain and myalgias.  Skin:  Negative for pallor and rash.  Allergic/Immunologic: Negative for environmental allergies.  Neurological:  Negative for dizziness, syncope and headaches.  Hematological:  Negative for adenopathy. Does not bruise/bleed easily.  Psychiatric/Behavioral:  Negative for decreased concentration and dysphoric mood. The patient is not nervous/anxious.        Objective:   Physical Exam Constitutional:      General: She is not in acute distress.    Appearance: Normal appearance. She is well-developed. She is obese. She is not ill-appearing or diaphoretic.  HENT:     Head: Normocephalic and atraumatic.     Right Ear: Tympanic membrane, ear canal and external ear normal.     Left Ear: Tympanic membrane, ear canal and external ear normal.     Nose: Nose normal. No congestion.     Mouth/Throat:     Mouth: Mucous membranes are moist.     Pharynx: Oropharynx is clear. No posterior oropharyngeal erythema.  Eyes:     General: No scleral icterus.    Extraocular Movements: Extraocular movements intact.     Conjunctiva/sclera: Conjunctivae normal.     Pupils: Pupils are equal, round, and reactive to light.  Neck:     Thyroid: No thyromegaly.     Vascular: No carotid bruit or JVD.  Cardiovascular:     Rate and Rhythm: Normal rate and regular rhythm.     Pulses: Normal pulses.     Heart sounds: Murmur heard.     No gallop.  Pulmonary:     Effort: Pulmonary effort is normal. No respiratory distress.     Breath sounds: Normal breath sounds. No wheezing.     Comments: Good air exch Chest:     Chest wall: No tenderness.  Abdominal:     General: Bowel sounds are normal. There is no distension or abdominal bruit.     Palpations: Abdomen is soft. There is no  mass.      Tenderness: There is no abdominal tenderness.     Hernia: No hernia is present.  Genitourinary:    Comments: Declines breast exam / mammo/cancer screening  Musculoskeletal:        General: No tenderness. Normal range of motion.     Cervical back: Normal range of motion and neck supple. No rigidity. No muscular tenderness.     Right lower leg: No edema.     Left lower leg: No edema.     Comments: No kyphosis   Lymphadenopathy:     Cervical: No cervical adenopathy.  Skin:    General: Skin is warm and dry.     Coloration: Skin is not pale.     Findings: No erythema or rash.     Comments: Solar lentigines diffusely Some sks  Neurological:     Mental Status: She is alert. Mental status is at baseline.     Cranial Nerves: No cranial nerve deficit.     Motor: No abnormal muscle tone.     Coordination: Coordination normal.     Gait: Gait normal.     Deep Tendon Reflexes: Reflexes are normal and symmetric. Reflexes normal.  Psychiatric:        Mood and Affect: Mood normal.        Cognition and Memory: Cognition and memory normal.           Assessment & Plan:   Problem List Items Addressed This Visit       Cardiovascular and Mediastinum   PAF (paroxysmal atrial fibrillation) (HCC)   Conitnues cardiology care  Cartia xt  Eliquis       Essential hypertension - Primary   bp in fair control at this time  BP Readings from Last 1 Encounters:  01/27/24 110/60   No changes needed Most recent labs reviewed  Disc lifstyle change with low sodium diet and exercise  Cartia XT 180 mg daily  Avapro 300 mg daily   Under card care also      Relevant Orders   TSH   Lipid panel   Chronic diastolic heart failure (HCC)   No issues or symptoms  Under care of cardiology        Digestive   Ginette Pitman   This occurs with antibiotic Had recently  Refilled nystatin solution  Instructed to change tooth brush when needed       Relevant Medications   nystatin (MYCOSTATIN) 100000  UNIT/ML suspension     Musculoskeletal and Integument   Seropositive rheumatoid arthritis (HCC)   Continues rheum care  Methotrexate  Encouraged to keep up with vaccines       Osteopenia   Declines another bone density test No falls or fracture  Discussed fall prevention, supplements and exercise for bone density   Exercise is limited Encouraged to add strength training however possible        Genitourinary   Recurrent UTI (urinary tract infection)   Continues trimethoprim for this Doing well  Renal numbers are re assuring       Relevant Medications   nystatin (MYCOSTATIN) 100000 UNIT/ML suspension     Other   Obesity (BMI 30-39.9)   Discussed how this problem influences overall health and the risks it imposes  Reviewed plan for weight loss with lower calorie diet (via better food choices (lower glycemic and portion control) along with exercise building up to or more than 30 minutes 5 days per week including some aerobic activity and strength training  Hyperlipidemia   Disc goals for lipids and reasons to control them Rev last labs with pt Rev low sat fat diet in detail Due for labs today  Atorvastatin 10 mg daily       Relevant Orders   Lipid panel   Diabetes mellitus screening   A1c ordered in light of bmi over 30  disc imp of low glycemic diet and wt loss to prevent DM2       Relevant Orders   Hemoglobin A1c   B12 deficiency   B12 shots monthly  Level today       Relevant Orders   Vitamin B12   Anxious mood   Doing well  Good GAD 7 score  Tranxene prn with stressors   Encouraged good self care

## 2024-01-27 NOTE — Assessment & Plan Note (Addendum)
 Declines another bone density test No falls or fracture  Discussed fall prevention, supplements and exercise for bone density   Exercise is limited Encouraged to add strength training however possible

## 2024-01-27 NOTE — Assessment & Plan Note (Signed)
 This occurs with antibiotic Had recently  Refilled nystatin solution  Instructed to change tooth brush when needed

## 2024-01-27 NOTE — Assessment & Plan Note (Signed)
 Continues trimethoprim for this Doing well  Renal numbers are re assuring

## 2024-01-27 NOTE — Assessment & Plan Note (Signed)
 bp in fair control at this time  BP Readings from Last 1 Encounters:  01/27/24 110/60   No changes needed Most recent labs reviewed  Disc lifstyle change with low sodium diet and exercise  Cartia XT 180 mg daily  Avapro 300 mg daily   Under card care also

## 2024-01-27 NOTE — Assessment & Plan Note (Signed)
 No issues or symptoms  Under care of cardiology

## 2024-01-27 NOTE — Patient Instructions (Addendum)
 If you are interested in the new shingles vaccine (Shingrix) - call your local pharmacy to check on coverage and availability   Stay active Add some strength training to your routine, this is important for bone and brain health and can reduce your risk of falls and help your body use insulin properly and regulate weight  Light weights, exercise bands , and internet videos are a good way to start  Yoga (chair or regular), machines , floor exercises or a gym with machines are also good options    Stay social also   Use the nystatin for thrush  If not improving let us know   Lab today

## 2024-01-27 NOTE — Assessment & Plan Note (Signed)
 Discussed how this problem influences overall health and the risks it imposes  Reviewed plan for weight loss with lower calorie diet (via better food choices (lower glycemic and portion control) along with exercise building up to or more than 30 minutes 5 days per week including some aerobic activity and strength training

## 2024-01-27 NOTE — Assessment & Plan Note (Signed)
 B12 shots monthly  Level today

## 2024-01-27 NOTE — Telephone Encounter (Signed)
 Last Fill: 10/08/2023  Labs: 01/11/2024 WBC 11.5, RBC 3.41, Hgb 11.3, Hct 34.6, MCV 101.5, MCH 33.1, RDW 15.3, Absolute Monocytes 1,254, Total Protein 5.8  Next Visit: 04/18/2024  Last Visit: 01/11/2024  DX: Seropositive rheumatoid arthritis   Current Dose per office note 01/11/2024: methotrexate 15 mg subcu weekly   Okay to refill Methotrexate?

## 2024-01-27 NOTE — Progress Notes (Signed)
 Sed rate of 9 remains normal. Her MCV is 101.5 which is slightly high probably due to methotrexate but hemoglobin 11.3 is not bad so we can just monitor that. Kidney and liver function tests are normal.

## 2024-02-03 ENCOUNTER — Ambulatory Visit: Payer: Medicare Other

## 2024-02-03 DIAGNOSIS — E538 Deficiency of other specified B group vitamins: Secondary | ICD-10-CM | POA: Diagnosis not present

## 2024-02-03 MED ORDER — CYANOCOBALAMIN 1000 MCG/ML IJ SOLN
1000.0000 ug | Freq: Once | INTRAMUSCULAR | Status: AC
  Administered 2024-02-03: 1000 ug via INTRAMUSCULAR

## 2024-02-03 NOTE — Progress Notes (Signed)
Per orders of Dr. Glori Bickers, injection of monthly B12 1000 mcg/ml given by Pilar Grammes, CMA in Right Deltoid.  Patient tolerated injection well.

## 2024-02-08 DIAGNOSIS — N3020 Other chronic cystitis without hematuria: Secondary | ICD-10-CM | POA: Diagnosis not present

## 2024-02-19 ENCOUNTER — Other Ambulatory Visit: Payer: Self-pay

## 2024-02-19 ENCOUNTER — Emergency Department
Admission: EM | Admit: 2024-02-19 | Discharge: 2024-02-19 | Disposition: A | Discharge status: Home / Self Care | Attending: Emergency Medicine | Admitting: Emergency Medicine

## 2024-02-19 ENCOUNTER — Emergency Department

## 2024-02-19 DIAGNOSIS — X509XXA Other and unspecified overexertion or strenuous movements or postures, initial encounter: Secondary | ICD-10-CM | POA: Diagnosis not present

## 2024-02-19 DIAGNOSIS — S46812A Strain of other muscles, fascia and tendons at shoulder and upper arm level, left arm, initial encounter: Secondary | ICD-10-CM

## 2024-02-19 DIAGNOSIS — R1111 Vomiting without nausea: Secondary | ICD-10-CM | POA: Diagnosis not present

## 2024-02-19 DIAGNOSIS — M25512 Pain in left shoulder: Secondary | ICD-10-CM | POA: Diagnosis not present

## 2024-02-19 DIAGNOSIS — S29012A Strain of muscle and tendon of back wall of thorax, initial encounter: Secondary | ICD-10-CM | POA: Insufficient documentation

## 2024-02-19 MED ORDER — LIDOCAINE 5 % EX PTCH: 1.0000 | MEDICATED_PATCH | CUTANEOUS | 0 refills | Status: AC

## 2024-02-19 MED ORDER — ACETAMINOPHEN 500 MG PO TABS
1000.0000 mg | ORAL_TABLET | Freq: Once | ORAL | Status: DC
  Filled 2024-02-19: qty 2

## 2024-02-19 MED ORDER — KETOROLAC TROMETHAMINE 60 MG/2ML IM SOLN
30.0000 mg | Freq: Once | INTRAMUSCULAR | Status: AC
  Administered 2024-02-19: 30 mg via INTRAMUSCULAR
  Filled 2024-02-19: qty 2

## 2024-02-19 MED ORDER — ONDANSETRON HCL 4 MG PO TABS: 4.0000 mg | ORAL_TABLET | Freq: Three times a day (TID) | ORAL | 0 refills | Status: DC | PRN

## 2024-02-19 MED ORDER — LIDOCAINE 5 % EX PTCH
1.0000 | MEDICATED_PATCH | Freq: Once | CUTANEOUS | Status: DC
  Administered 2024-02-19: 1 via TRANSDERMAL
  Filled 2024-02-19: qty 1

## 2024-02-19 MED ORDER — METHOCARBAMOL 500 MG PO TABS
1000.0000 mg | ORAL_TABLET | Freq: Once | ORAL | Status: AC
  Administered 2024-02-19: 1000 mg via ORAL
  Filled 2024-02-19: qty 2

## 2024-02-19 NOTE — ED Provider Notes (Signed)
 [facility] Provider Note    Event Date/Time   First MD Initiated Contact with Patient 02/19/24 1419     (approximate)   History   Shoulder Injury   HPI Tammy Manning is a 89 y.o. female presenting today for left shoulder and left upper back pain.  Patient states a couple days ago they were help making a bed when she pulled really fast and felt a sharp pain in her left shoulder and left upper back.  Since then she has pain when leaning on that side.  She denies any other trauma or falls recently.  No neck pain.  No numbness or tingling in her arm.  No discoloration to her arm.     Physical Exam   Triage Vital Signs: ED Triage Vitals  Encounter Vitals Group     BP      Systolic BP Percentile      Diastolic BP Percentile      Pulse      Resp      Temp      Temp src      SpO2      Weight      Height      Head Circumference      Peak Flow      Pain Score      Pain Loc      Pain Education      Exclude from Growth Chart     Most recent vital signs: Vitals:   02/19/24 1426  BP: (!) 170/61  Pulse: 81  Resp: 18  Temp: 98.6 F (37 C)  SpO2: 100%    I have reviewed the vital signs. General:  Awake, alert, no acute distress. Head:  Normocephalic, Atraumatic. EENT:  PERRL, EOMI, Oral mucosa pink and moist, Neck is supple. Cardiovascular: Regular rate, 2+ distal pulses. Respiratory:  Normal respiratory effort, symmetrical expansion, no distress.   Extremities:  Moving all four extremities through full ROM without pain.  No specific tenderness palpation throughout entire left upper extremity.  Normal range of motion without pain in the shoulder.  Slight pain when performing exercises involving the trapezius muscle on the left side Neuro:  Alert and oriented.  Interacting appropriately.   Skin:  Warm, dry, no rash.   Psych: Appropriate affect.    ED Results / Procedures / Treatments   Labs (all labs ordered are listed, but only abnormal  results are displayed) Labs Reviewed - No data to display   EKG    RADIOLOGY Independently interpreted left shoulder x-ray with no acute pathology   PROCEDURES:  Critical Care performed: No  Procedures   MEDICATIONS ORDERED IN ED: Medications  lidocaine (LIDODERM) 5 % 1 patch (1 patch Transdermal Patch Applied 02/19/24 1449)  acetaminophen (TYLENOL) tablet 1,000 mg (1,000 mg Oral Patient Refused/Not Given 02/19/24 1451)  ketorolac (TORADOL) injection 30 mg (30 mg Intramuscular Given 02/19/24 1444)  methocarbamol (ROBAXIN) tablet 1,000 mg (1,000 mg Oral Given 02/19/24 1448)     IMPRESSION / MDM / ASSESSMENT AND PLAN / ED COURSE  I reviewed the triage vital signs and the nursing notes.                              Differential diagnosis includes, but is not limited to, muscular strain, soft tissue hematoma  Patient's presentation is most consistent with acute complicated illness / injury requiring diagnostic workup.  Patient is an 89 year old female  presenting today for left shoulder and left upper back pain.  Her exam seems most consistent with likely muscular strain from pulling on a sheet with the bed.  No specific tenderness palpation over the shoulder but given her concerns we will get an x-ray for further evaluation.  Will trial multiple medications for pain control including Robaxin, Toradol, and Lidoderm patch.  X-ray shows no acute injuries to the shoulder.  Patient was feeling better following medications.  Will discharge her with Lidoderm patches and Zofran as needed to help with her nausea symptoms.  Suspect likely muscular strain and will have her follow-up with PCP as needed.     FINAL CLINICAL IMPRESSION(S) / ED DIAGNOSES   Final diagnoses:  Trapezius muscle strain, left, initial encounter     Rx / DC Orders   ED Discharge Orders          Ordered    lidocaine (LIDODERM) 5 %  Every 24 hours        02/19/24 1534    ondansetron (ZOFRAN) 4 MG tablet  Every  8 hours PRN        02/19/24 1534             Note:  This document was prepared using Dragon voice recognition software and may include unintentional dictation errors.   Janith Lima, MD 02/19/24 [phone]

## 2024-02-19 NOTE — ED Triage Notes (Signed)
 Per EMT report, patient c/o left shoulder pain that at times radiates from left hand to left side of neck. Patient states she believes she injured it two days ago while pulling a sheet on the bed. Patient states she woke up the next morning with pain. Patient states the pain is makes her vomit due to severity and hasn't been able to keep her medications down.   154/62 96% Room air 87 pulse CBG 105

## 2024-02-19 NOTE — Discharge Instructions (Signed)
 I suspect based on your story this is likely a muscular strain.  I have sent medication to your pharmacy for you to use as needed.  You can continue using the tramadol and Tylenol as well that you have at home.  Please follow-up with your primary care provider for reevaluation as needed.

## 2024-02-21 ENCOUNTER — Telehealth: Payer: Self-pay

## 2024-02-21 NOTE — Transitions of Care (Post Inpatient/ED Visit) (Signed)
 02/21/2024  Name: Tammy Manning MRN: [id number] DOB: 1935-05-29  Today's TOC FU Call Status: Today's TOC FU Call Status:: Successful TOC FU Call Completed TOC FU Call Complete Date: 02/21/24 Patient's Name and Date of Birth confirmed.  Transition Care Management Follow-up Telephone Call Date of Discharge: 02/19/24 Discharge Facility: [facility]) Type of Discharge: Emergency Department Reason for ED Visit: Orthopedic Conditions Orthopedic/Injury Diagnosis: Sprain or Strain (Trapezius strain) How have you been since you were released from the hospital?: Same Any questions or concerns?: Yes Patient Questions/Concerns:: Patient continues to be in significant pain Patient Questions/Concerns Addressed: Notified Provider of Patient Questions/Concerns  Items Reviewed: Did you receive and understand the discharge instructions provided?: Yes Medications obtained,verified, and reconciled?: Yes (Medications Reviewed) Any new allergies since your discharge?: No Dietary orders reviewed?: NA Do you have support at home?: Yes People in Home: spouse  Medications Reviewed Today: Medications Reviewed Today     Reviewed by Anthoney Harada, LPN (Licensed Practical Nurse) on 02/21/24 at 1619  Med List Status: <None>   Medication Order Taking? Sig Documenting Provider Last Dose Status Informant  atorvastatin (LIPITOR) 10 MG tablet [id number] Yes Take 1 tablet (10 mg total) by mouth daily. [name], Audrie Gallus, MD Taking Active   Calcium Carbonate-Vitamin D (CALCIUM 600+D PO) [id number] Yes Take 1 tablet by mouth daily.  [provider] Taking Active Self  CARTIA XT 180 MG 24 hr capsule [id number] Yes TAKE 1 CAPSULE(180 MG) BY MOUTH DAILY [place] Ouch, MD Taking Active   clobetasol ointment (TEMOVATE) 0.05 % [id number] Yes Apply 1 application topically 2 (two) times daily as needed. [provider] Taking Active Self  clorazepate (TRANXENE) 7.5 MG tablet  [id number] Yes Take 1 tablet (7.5 mg total) by mouth daily as needed. [name], Audrie Gallus, MD Taking Active   cyanocobalamin (,VITAMIN B-12,) 1000 MCG/ML injection [id number] Yes Inject 1,000 mcg into the muscle every 30 (thirty) days.  [provider] Taking Active Self  ELIQUIS 5 MG TABS tablet [id number] Yes TAKE 1 TABLET BY MOUTH TWICE DAILY [place] Ouch, MD Taking Active   estradiol (ESTRACE) 0.1 MG/GM vaginal cream [id number] Yes Apply a pea-sized amount to fingertip and wipe in vaginal introitus twice weekly McGowan, Carollee Herter A, PA-C Taking Active   famotidine (PEPCID) 20 MG tablet [id number] Yes Take 20 mg by mouth 2 (two) times daily as needed for heartburn or indigestion. [provider] Taking Active   guaiFENesin (MUCINEX) 600 MG 12 hr tablet [id number] Yes Take 600 mg by mouth 2 (two) times daily as needed. [provider] Taking Active            Med Note Dorathy Daft, BRENDA L   Tue Dec 28, 2023  1:57 PM) prn  hydrocortisone (ANUSOL-HC) 2.5 % rectal cream [id number] Yes Apply 1 Application topically at bedtime. To affected area for 10 days [name], Audrie Gallus, MD Taking Active   hydrocortisone (ANUSOL-HC) 25 MG suppository [id number] Yes Place 1 suppository (25 mg total) rectally 2 (two) times daily. [name], Audrie Gallus, MD Taking Active   irbesartan (AVAPRO) 300 MG tablet [id number] Yes TAKE 1 TABLET BY MOUTH DAILY [name], Audrie Gallus, MD Taking Active   lidocaine (LIDODERM) 5 % [id number] Yes Place 1 patch onto the skin daily for 10 days. Remove & Discard patch within 12 hours or as directed by MD Janith Lima, MD Taking Active   loperamide (IMODIUM) 2 MG capsule [id number] Yes Take 2 mg by mouth as needed for diarrhea or  loose stools. [provider] Taking Active   methotrexate 250 MG/10ML injection [id number] Yes INJECT 0.6 MLS INTO THE SKIN ONCE A WEEK Rice, Jamesetta Orleans, MD Taking Active   nystatin (MYCOSTATIN) 100000 UNIT/ML suspension [id number] Yes Take 5 mLs (500,000  Units total) by mouth 3 (three) times daily. Swish and swallow [name], Audrie Gallus, MD Taking Active   ondansetron (ZOFRAN) 4 MG tablet [id number] Yes Take 1 tablet (4 mg total) by mouth every 8 (eight) hours as needed. Janith Lima, MD Taking Active   triamcinolone (NASACORT) 55 MCG/ACT AERO nasal inhaler [id number] Yes Place 2 sprays into the nose daily. [name], Audrie Gallus, MD Taking Active   trimethoprim (TRIMPEX) 100 MG tablet [id number] Yes Take 1 tablet (100 mg total) by mouth daily. [name], Audrie Gallus, MD Taking Active   Tuberculin-Allergy Syringes 27G X 1/2" 1 ML MISC [id number] Yes Use 1 syringe once weekly to inject methotrexate. Fuller Plan, MD Taking Active             Home Care and Equipment/Supplies: Were Home Health Services Ordered?: NA Any new equipment or medical supplies ordered?: NA  Functional Questionnaire: Do you need assistance with bathing/showering or dressing?: No Do you need assistance with meal preparation?: No Do you need assistance with eating?: No Do you have difficulty maintaining continence: No Do you need assistance with getting out of bed/getting out of a chair/moving?: No Do you have difficulty managing or taking your medications?: No  Follow up appointments reviewed: PCP Follow-up appointment confirmed?: Yes Date of PCP follow-up appointment?: 02/23/24 Follow-up Provider: Dr. Idamae Schuller [facility] Follow-up appointment confirmed?: NA Do you need transportation to your follow-up appointment?: No Do you understand care options if your condition(s) worsen?: Yes-patient verbalized understanding    SIGNATURE .cbs

## 2024-02-23 ENCOUNTER — Encounter: Payer: Self-pay | Admitting: Family Medicine

## 2024-02-23 ENCOUNTER — Encounter: Payer: Self-pay | Admitting: *Deleted

## 2024-02-23 ENCOUNTER — Ambulatory Visit (INDEPENDENT_AMBULATORY_CARE_PROVIDER_SITE_OTHER): Admitting: Family Medicine

## 2024-02-23 VITALS — BP 126/64 | HR 80 | Temp 97.8°F | Ht 64.0 in | Wt 171.0 lb

## 2024-02-23 DIAGNOSIS — I10 Essential (primary) hypertension: Secondary | ICD-10-CM | POA: Diagnosis not present

## 2024-02-23 DIAGNOSIS — M898X1 Other specified disorders of bone, shoulder: Secondary | ICD-10-CM | POA: Diagnosis not present

## 2024-02-23 DIAGNOSIS — M7918 Myalgia, other site: Secondary | ICD-10-CM | POA: Insufficient documentation

## 2024-02-23 MED ORDER — PREDNISONE 10 MG PO TABS: ORAL_TABLET | ORAL | 0 refills | Status: DC

## 2024-02-23 NOTE — Assessment & Plan Note (Signed)
 Blood pressure was very high in ER with back pain and improved today BP Readings from Last 3 Encounters:  02/23/24 126/64  02/19/24 (!) 149/61  01/27/24 110/60    Continues Cartia XT 180 mg daily  Avapro 300 mg daily   Also sees cardiology

## 2024-02-23 NOTE — Progress Notes (Signed)
 Subjective:    Patient ID: Tammy Manning, female    DOB: 06-10-1935, 89 y.o.   MRN: [id number]  HPI  Wt Readings from Last 3 Encounters:  02/23/24 171 lb (77.6 kg)  02/19/24 169 lb (76.7 kg)  01/27/24 169 lb 12.8 oz (77 kg)   29.35 kg/m  Vitals:   02/23/24 1155  BP: 126/64  Pulse: 80  Temp: 97.8 F (36.6 C)  SpO2: 97%    Pt presents for follow up of ER visit on 02/19/24 for trapezius muscle strain   Presented with left shoulder and upper back area pain  Happened when pulling sheet to make bed   DG Shoulder Left Result Date: 02/19/2024 CLINICAL DATA:  Left shoulder injury.  Pain. EXAM: LEFT SHOULDER - 2+ VIEW COMPARISON:  None Available. FINDINGS: There is no evidence of fracture or dislocation. Mild acromioclavicular degenerative spurring. No erosive change or focal bone abnormality. Soft tissues are unremarkable. IMPRESSION: 1. No fracture or dislocation of the left shoulder. 2. Mild acromioclavicular degenerative spurring. Electronically Signed   By: Narda Rutherford M.D.   On: 02/19/2024 15:20   Was given keterolac, methocarbamol in ER and had some improvement    Discharged with lido patches and zofran    Of note-take eliquis so oral nsaid is not option    Still not doing well  Cannot get comfortable to sleep  Hard to even brush her teeth   Sharp in nature  In shoulder blade  No pain in front of shoulder   Lying on back in bed is the least uncomfortable without moving  Most uncomfortable to move her head   Has tried  Massage- does not feel good  Uses the lidocaine- that helps a little  Methocarbamol does not help  Ice - make it worse  Heat -not helpful  Tried tramadol- but it causes vomiting  Has never been able to keep down any pain med of any kind in the past  Is taking tylenol   Trying to do some neck exercises gently     Patient Active Problem List   Diagnosis Date Noted   Pain of left scapula 02/23/2024   Rhomboid muscle pain 02/23/2024    Diabetes mellitus screening 01/27/2024   Thrush 01/27/2024   Anxious mood 09/20/2023   Tremor of both hands 09/20/2023   Head injury 04/28/2023   Superficial injury of skin 04/21/2023   Diverticulosis 03/21/2023   Chronic diastolic heart failure (HCC) 03/13/2023   Obesity (BMI 30-39.9) 03/13/2023   PAF (paroxysmal atrial fibrillation) (HCC) 03/13/2023   Recurrent UTI (urinary tract infection) 02/18/2023   Intertrigo 02/16/2023   Hemorrhoids 02/02/2023   Urinary incontinence 02/02/2023   Dilated aortic root (HCC) 01/26/2023   Osteopenia 01/26/2023   Multinodular goiter 02/27/2022   Encounter for screening mammogram for breast cancer 01/23/2022   High risk medication use 10/07/2021   Atrial fibrillation with RVR (HCC) 04/28/2021   Hypertrophic toenail 04/22/2021   Seropositive rheumatoid arthritis (HCC) 11/25/2020   Bilateral hand pain 11/25/2020   Bilateral shoulder pain 10/16/2020   Joint pain 10/16/2020   Estrogen deficiency 04/30/2020   Dry mouth 03/15/2020   Osteoarthritis of right hip 01/02/2020   Right thyroid nodule 05/12/2018   History of TIA (transient ischemic attack) 05/09/2018   Aortic insufficiency 09/10/2015   Stress reaction 09/08/2015   Encounter for Medicare annual wellness exam 02/28/2013   Bucket-handle tear of lateral meniscus of left knee as current injury 12/30/2012   Episodic atrial fibrillation (HCC) 11/17/2012  Hearing loss of both ears 01/14/2012   B12 deficiency 03/17/2010   GERD (gastroesophageal reflux disease) 03/17/2010   POSTMENOPAUSAL STATUS 08/20/2008   Essential hypertension 07/19/2007   Hyperlipidemia 06/24/2007   IBS 06/24/2007   DERMATITIS, ATOPIC 06/24/2007   SKIN CANCER, HX OF 06/24/2007   Past Medical History:  Diagnosis Date   A-fib (HCC)    Anginal pain (HCC)    Aortic insufficiency    a. 02/2020 Echo: EF 60-65%, no rwma, Gr1 DD, nl RV fxn, mildly dil LA, triv AI, Asc Ao 39mm; b. 04/2021 Echo: EF 60-65%, no rwma, GrI DD, nl  RV fxn, mildly dil LA, AI not visualized. Mild-mod AoV sclerosis w/o stenosis.   Arthritis    Knees   B12 deficiency    Mild   Bucket-handle tear of lateral meniscus of left knee as current injury 12/30/2012   Cancer (HCC)    Skin, basal cell on nose and eyelid   Colitis    COVID    DJD (degenerative joint disease)    Low back   Dyspepsia    GERD (gastroesophageal reflux disease)    Heart murmur    Heart palpitations    History of cardiac cath    a. 02/2017 Cath: Nl cors. Nl LV fxn. Abd Aogram w/o RAS.   HOH (hard of hearing)    Hyperlipidemia    Hypertension    IBS (irritable bowel syndrome)    Lactose intolerance    Mixed incontinence    PONV (postoperative nausea and vomiting)    Urinary tract infection    Vulvar irritation    from urine pad, uses Triamcinolone oint.   Past Surgical History:  Procedure Laterality Date   ABDOMINAL AORTOGRAM N/A 03/15/2017   Procedure: Abdominal Aortogram;  Surgeon: Iran Ouch, MD;  Location: [facility] INVASIVE CV [facility];  Service: Cardiovascular;  Laterality: N/A;   ABDOMINAL HYSTERECTOMY  1982   Large fibroids and bleeding   APPENDECTOMY     BILATERAL SALPINGOOPHORECTOMY  1986   BONE GRAFT HIP ILIAC CREST     To Left arm   BREAST REDUCTION SURGERY     BREAST SURGERY     CARDIAC CATHETERIZATION     CATARACT EXTRACTION W/PHACO Left 07/07/2016   Procedure: CATARACT EXTRACTION PHACO AND INTRAOCULAR LENS PLACEMENT (IOC);  Surgeon: Galen Manila, MD;  Location: [facility] ORS;  Service: Ophthalmology;  Laterality: Left;  [place] 01:10 AP% 18.3 CDE 12.91 Fluid pak lot # [id number]   CATARACT EXTRACTION W/PHACO Right 07/28/2016   Procedure: CATARACT EXTRACTION PHACO AND INTRAOCULAR LENS PLACEMENT (IOC);  Surgeon: Galen Manila, MD;  Location: [facility] ORS;  Service: Ophthalmology;  Laterality: Right;  [place] 01:26 AP% 18.5 CDE 16.12 Fluid pack lot # [id number]   COLONOSCOPY  07/2016   Dr. Lottie Mussel   COLONOSCOPY WITH PROPOFOL N/A 04/01/2023   Procedure:  COLONOSCOPY WITH PROPOFOL;  Surgeon: Toney Reil, MD;  Location: [facility] ENDOSCOPY;  Service: Gastroenterology;  Laterality: N/A;   DILATION AND CURETTAGE OF UTERUS     miscarragesx2   ESOPHAGOGASTRODUODENOSCOPY     Polyps   HEMORRHOID SURGERY  2006   KNEE ARTHROSCOPY WITH LATERAL MENISECTOMY Left 12/30/2012   Procedure: KNEE ARTHROSCOPY WITH LATERAL MENISECTOMY;  Surgeon: Eulas Post, MD;  Location: [facility];  Service: Orthopedics;  Laterality: Left;  LEFT KNEE SCOPE LATERAL MENISCECTOMY   LEFT HEART CATH AND CORONARY ANGIOGRAPHY N/A 03/15/2017   Procedure: Left Heart Cath and Coronary Angiography;  Surgeon: Iran Ouch, MD;  Location: [facility] INVASIVE  CV LAB;  Service: Cardiovascular;  Laterality: N/A;   MOUTH SURGERY     skin cancer eyelid  2012   TONSILLECTOMY     Social History   Tobacco Use   Smoking status: Former    Current packs/day: 0.00    Average packs/day: 1 pack/day for 12.0 years (12.0 ttl pk-yrs)    Types: Cigarettes    Start date: 11/24/1951    Quit date: 11/24/1963    Years since quitting: 60.2    Passive exposure: Never   Smokeless tobacco: Never  Vaping Use   Vaping status: Never Used  Substance Use Topics   Alcohol use: No    Alcohol/week: 0.0 standard drinks of alcohol   Drug use: No   Family History  Problem Relation Age of Onset   Cancer Mother        Colon   Heart disease Father        CAD   Diabetes Sister    Hypothyroidism Sister    Diabetes Sister    Alzheimer's disease Sister    Diabetes Brother    Leukemia Brother    Esophageal cancer Brother    Diabetes Brother    Cancer Paternal Aunt        Breast   Breast cancer Daughter    Allergies  Allergen Reactions   Antihistamines, Chlorpheniramine-Type Other (See Comments)    Reaction:  Makes pt hyper    Atenolol Hypertension   Cefuroxime Axetil Other (See Comments)    Upset stomach   Ciprofloxacin Other (See Comments)    Upset stomach   Co Q 10 [Coenzyme Q10]  Other (See Comments)    Reaction:  GI upset    Crestor [Rosuvastatin Calcium] Other (See Comments)    Reaction:  Myalgias    Dextromethorphan-Guaifenesin Other (See Comments)    Reaction:  Made pt jittery    Elavil [Amitriptyline]     Felt worse /tearful    Epinephrine Hives   Methenamine     Tongue discomfort    Ramipril Other (See Comments)    Upset stomach   Sulfamethoxazole-Trimethoprim Other (See Comments)    Upset stomach   Vitamin D Analogs Other (See Comments)    Reaction:  GI upset    Current Outpatient Medications on File Prior to Visit  Medication Sig Dispense Refill   atorvastatin (LIPITOR) 10 MG tablet Take 1 tablet (10 mg total) by mouth daily. 90 tablet 3   Calcium Carbonate-Vitamin D (CALCIUM 600+D PO) Take 1 tablet by mouth daily.      CARTIA XT 180 MG 24 hr capsule TAKE 1 CAPSULE(180 MG) BY MOUTH DAILY 90 capsule 3   clobetasol ointment (TEMOVATE) 0.05 % Apply 1 application topically 2 (two) times daily as needed.     clorazepate (TRANXENE) 7.5 MG tablet Take 1 tablet (7.5 mg total) by mouth daily as needed. 30 tablet 0   cyanocobalamin (,VITAMIN B-12,) 1000 MCG/ML injection Inject 1,000 mcg into the muscle every 30 (thirty) days.      ELIQUIS 5 MG TABS tablet TAKE 1 TABLET BY MOUTH TWICE DAILY 180 tablet 1   estradiol (ESTRACE) 0.1 MG/GM vaginal cream Apply a pea-sized amount to fingertip and wipe in vaginal introitus twice weekly 42.5 g 1   famotidine (PEPCID) 20 MG tablet Take 20 mg by mouth 2 (two) times daily as needed for heartburn or indigestion.     guaiFENesin (MUCINEX) 600 MG 12 hr tablet Take 600 mg by mouth 2 (two) times daily as needed.  hydrocortisone (ANUSOL-HC) 2.5 % rectal cream Apply 1 Application topically at bedtime. To affected area for 10 days 30 g 0   hydrocortisone (ANUSOL-HC) 25 MG suppository Place 1 suppository (25 mg total) rectally 2 (two) times daily. 10 suppository 0   irbesartan (AVAPRO) 300 MG tablet TAKE 1 TABLET BY MOUTH DAILY  90 tablet 2   lidocaine (LIDODERM) 5 % Place 1 patch onto the skin daily for 10 days. Remove & Discard patch within 12 hours or as directed by MD 10 patch 0   loperamide (IMODIUM) 2 MG capsule Take 2 mg by mouth as needed for diarrhea or loose stools.     methotrexate 250 MG/10ML injection INJECT 0.6 MLS INTO THE SKIN ONCE A WEEK 4 mL 2   nystatin (MYCOSTATIN) 100000 UNIT/ML suspension Take 5 mLs (500,000 Units total) by mouth 3 (three) times daily. Swish and swallow 120 mL 1   ondansetron (ZOFRAN) 4 MG tablet Take 1 tablet (4 mg total) by mouth every 8 (eight) hours as needed. 15 tablet 0   triamcinolone (NASACORT) 55 MCG/ACT AERO nasal inhaler Place 2 sprays into the nose daily. 1 each 12   trimethoprim (TRIMPEX) 100 MG tablet Take 1 tablet (100 mg total) by mouth daily. 30 tablet 2   Tuberculin-Allergy Syringes 27G X 1/2" 1 ML MISC Use 1 syringe once weekly to inject methotrexate. 25 each 1   No current facility-administered medications on file prior to visit.    Review of Systems  Constitutional:  Positive for fatigue.  Respiratory:  Negative for chest tightness.   Cardiovascular:  Negative for chest pain.  Musculoskeletal:  Positive for back pain and neck pain. Negative for joint swelling and neck stiffness.       Objective:   Physical Exam Constitutional:      General: She is not in acute distress.    Appearance: Normal appearance. She is obese. She is not ill-appearing.     Comments: Pt appears uncomfortable but not in distress  Eyes:     General:        Right eye: No discharge.        Left eye: No discharge.     Conjunctiva/sclera: Conjunctivae normal.     Pupils: Pupils are equal, round, and reactive to light.  Neck:     Comments: Full neck extension and rotation/tilt to left worsen her left scapular pain  No bony tenderness  Some mild trapezius tightness on left  Cardiovascular:     Rate and Rhythm: Normal rate and regular rhythm.  Pulmonary:     Effort: Pulmonary  effort is normal. No respiratory distress.     Breath sounds: No wheezing.     Comments: No back pain with deep breath  No rib tenderness Musculoskeletal:     Cervical back: Neck supple.     Comments: Tender in musculature between left scapula and spine with tightness on palpation  Few excoriations but no rash  Pain worsens with neck extension and tilt/rotation to right  No cs tenderness  No acromion tenderness Normal rom of arms   A rhomboid stretch does trigger the area of pain   Lymphadenopathy:     Cervical: No cervical adenopathy.  Skin:    Coloration: Skin is not pale.     Findings: No bruising, erythema or rash.  Neurological:     Mental Status: She is alert.     Sensory: No sensory deficit.     Motor: No weakness.  Psychiatric:  Mood and Affect: Mood normal.           Assessment & Plan:   Problem List Items Addressed This Visit       Cardiovascular and Mediastinum   Essential hypertension   Blood pressure was very high in ER with back pain and improved today BP Readings from Last 3 Encounters:  02/23/24 126/64  02/19/24 (!) 149/61  01/27/24 110/60    Continues Cartia XT 180 mg daily  Avapro 300 mg daily   Also sees cardiology         Other   Rhomboid muscle pain - Primary   Pain medial to left scapula since making her bed  Seen in ER on 3/29  Reviewed hospital records, lab results and studies in detail  -reassuring work up  Tender in rhomboid area/ also responds to rhomboid stretch  No rash seen -instructed to watch for it on the off chance this is early shingles (doubtful since positional)   Encouraged continues lido patch  Also try rhomboid stretch at home Heat /ice if helpful (not so far)  She tolerates no pain med due to GI reaction/nausea  Unfortunately methocarbamol did not help   Urgent referral to PT made Prednisone 30 mg taper prescription given (understands possible side effects) Cannot take nsaid due to eliquis   Update  if not starting to improve in a week or if worsening  Call back and Er precautions noted in detail today        Relevant Medications   predniSONE (DELTASONE) 10 MG tablet   Other Relevant Orders   Ambulatory referral to Physical Therapy   Pain of left scapula   Medial to scapula in area of rhomboid  See a/p rhomboid pain       Relevant Orders   Ambulatory referral to Physical Therapy

## 2024-02-23 NOTE — Assessment & Plan Note (Signed)
 Medial to scapula in area of rhomboid  See a/p rhomboid pain

## 2024-02-23 NOTE — Patient Instructions (Signed)
 Continue the lidoderm patches if they help  Also tylenol Take prednisone as directed  I put the referral in for PT  Please let us know if you don't hear in 1-2 weeks    Try the rhomboid stretch I showed you    Watch for a rash  Call asap if that develops

## 2024-02-23 NOTE — Assessment & Plan Note (Addendum)
 Pain medial to left scapula since making her bed  Seen in ER on 3/29  Reviewed hospital records, lab results and studies in detail  -reassuring work up  Tender in rhomboid area/ also responds to rhomboid stretch  No rash seen -instructed to watch for it on the off chance this is early shingles (doubtful since positional)   Encouraged continues lido patch  Also try rhomboid stretch at home Heat /ice if helpful (not so far)  She tolerates no pain med due to GI reaction/nausea  Unfortunately methocarbamol did not help   Urgent referral to PT made Prednisone 30 mg taper prescription given (understands possible side effects) Cannot take nsaid due to eliquis   Update if not starting to improve in a week or if worsening  Call back and Er precautions noted in detail today

## 2024-02-25 DIAGNOSIS — M4722 Other spondylosis with radiculopathy, cervical region: Secondary | ICD-10-CM | POA: Diagnosis not present

## 2024-02-25 DIAGNOSIS — M25512 Pain in left shoulder: Secondary | ICD-10-CM | POA: Diagnosis not present

## 2024-02-28 ENCOUNTER — Other Ambulatory Visit: Payer: Self-pay | Admitting: Cardiovascular Disease

## 2024-02-28 DIAGNOSIS — I4891 Unspecified atrial fibrillation: Secondary | ICD-10-CM

## 2024-02-28 NOTE — Telephone Encounter (Signed)
 Prescription refill request for Eliquis received. Indication: Afib  Last office visit: 09/30/23 Kirke Corin)  Scr: 0.88 (01/11/24)  Age: 89 Weight: 77.6kg  Appropriate dose. Refill sent.

## 2024-02-29 DIAGNOSIS — M25512 Pain in left shoulder: Secondary | ICD-10-CM | POA: Diagnosis not present

## 2024-02-29 DIAGNOSIS — M4722 Other spondylosis with radiculopathy, cervical region: Secondary | ICD-10-CM | POA: Diagnosis not present

## 2024-03-01 ENCOUNTER — Encounter: Payer: Self-pay | Admitting: Family Medicine

## 2024-03-01 DIAGNOSIS — M542 Cervicalgia: Secondary | ICD-10-CM

## 2024-03-01 NOTE — Telephone Encounter (Signed)
 I thought if was her rhomboid muscle area (that is not shoulder and also not neck, but movement of neck can cause the rhomboid to hurt)  Exactly what did PT think was her diagnosis  ?  Please send for notes  Has her pain worsened or changed ?  If this is cervical (neck) - we would need to get an xray before MRI  I would also consider orthopedics

## 2024-03-02 DIAGNOSIS — M25512 Pain in left shoulder: Secondary | ICD-10-CM | POA: Diagnosis not present

## 2024-03-02 DIAGNOSIS — M542 Cervicalgia: Secondary | ICD-10-CM | POA: Insufficient documentation

## 2024-03-02 DIAGNOSIS — M4722 Other spondylosis with radiculopathy, cervical region: Secondary | ICD-10-CM | POA: Diagnosis not present

## 2024-03-02 NOTE — Telephone Encounter (Signed)
 I reviewed and signed her PT report -on IN box  Let's see if it helps   I put order in for a CS xray if/when she wants to come in for that   Please reach out to her    Let her know that it may take as long as 10-14 days for the film to be read   Thanks

## 2024-03-02 NOTE — Telephone Encounter (Addendum)
 Called pt and she is coming tomorrow for xray she is okay with PCP's plan. She did say PT helped some and her pain is a little better but she is wearing a lot of pain patches but over all she can see some improvement in her pain. She will wait till PCP gets the results of Xray (is aware it can be a while) and then will see what plan PCP thinks pt needs based off xray results

## 2024-03-03 DIAGNOSIS — M542 Cervicalgia: Secondary | ICD-10-CM | POA: Diagnosis not present

## 2024-03-07 ENCOUNTER — Ambulatory Visit

## 2024-03-07 DIAGNOSIS — M542 Cervicalgia: Secondary | ICD-10-CM | POA: Diagnosis not present

## 2024-03-07 NOTE — Progress Notes (Deleted)
Per orders of Dr. Roxy Manns, injection of B-12 given by Donnamarie Poag in left deltoid. Patient tolerated injection well. Patient will make appointment for 1 month.

## 2024-03-15 ENCOUNTER — Emergency Department

## 2024-03-15 ENCOUNTER — Other Ambulatory Visit: Payer: Self-pay

## 2024-03-15 ENCOUNTER — Emergency Department
Admission: EM | Admit: 2024-03-15 | Discharge: 2024-03-15 | Disposition: A | Discharge status: Home / Self Care | Attending: Emergency Medicine | Admitting: Emergency Medicine

## 2024-03-15 DIAGNOSIS — W010XXA Fall on same level from slipping, tripping and stumbling without subsequent striking against object, initial encounter: Secondary | ICD-10-CM | POA: Diagnosis not present

## 2024-03-15 DIAGNOSIS — I4891 Unspecified atrial fibrillation: Secondary | ICD-10-CM | POA: Diagnosis not present

## 2024-03-15 DIAGNOSIS — S51012A Laceration without foreign body of left elbow, initial encounter: Secondary | ICD-10-CM | POA: Diagnosis not present

## 2024-03-15 DIAGNOSIS — S81812A Laceration without foreign body, left lower leg, initial encounter: Secondary | ICD-10-CM | POA: Insufficient documentation

## 2024-03-15 DIAGNOSIS — I10 Essential (primary) hypertension: Secondary | ICD-10-CM | POA: Insufficient documentation

## 2024-03-15 DIAGNOSIS — Y92009 Unspecified place in unspecified non-institutional (private) residence as the place of occurrence of the external cause: Secondary | ICD-10-CM | POA: Diagnosis not present

## 2024-03-15 DIAGNOSIS — Z23 Encounter for immunization: Secondary | ICD-10-CM | POA: Diagnosis not present

## 2024-03-15 DIAGNOSIS — M25522 Pain in left elbow: Secondary | ICD-10-CM | POA: Diagnosis present

## 2024-03-15 DIAGNOSIS — M79662 Pain in left lower leg: Secondary | ICD-10-CM | POA: Diagnosis not present

## 2024-03-15 DIAGNOSIS — S81012A Laceration without foreign body, left knee, initial encounter: Secondary | ICD-10-CM | POA: Diagnosis not present

## 2024-03-15 DIAGNOSIS — W19XXXA Unspecified fall, initial encounter: Secondary | ICD-10-CM | POA: Diagnosis not present

## 2024-03-15 DIAGNOSIS — Z85828 Personal history of other malignant neoplasm of skin: Secondary | ICD-10-CM | POA: Diagnosis not present

## 2024-03-15 MED ORDER — TETANUS-DIPHTH-ACELL PERTUSSIS 5-2.5-18.5 LF-MCG/0.5 IM SUSY
0.5000 mL | PREFILLED_SYRINGE | Freq: Once | INTRAMUSCULAR | Status: AC
  Administered 2024-03-15: 0.5 mL via INTRAMUSCULAR
  Filled 2024-03-15: qty 0.5

## 2024-03-15 NOTE — ED Triage Notes (Signed)
 [facility] reports pt coming from home for a fall. Pt tripped over her husbands foot and fell. Denies any LOC, unsure if she hit her head. Skin tear to left elbow and left knee. Pt is on blood thinners.

## 2024-03-15 NOTE — ED Provider Notes (Signed)
 [facility] Provider Note    Event Date/Time   First MD Initiated Contact with Patient 03/15/24 1301     (approximate)   History   Chief Complaint: Fall   HPI  Tammy Manning is a 89 y.o. female with a history of atrial fibrillation, hyperlipidemia, GERD who reports being in her usual state of health when she was walking across the room in the house, tripped over her husband's feet and fell onto her left side.  Complains of pain at the left elbow and left shin where she sustained skin tears.  Did not hit her head, no loss of consciousness, no neck pain or back pain.  Able to stand and bear weight.        Past Medical History:  Diagnosis Date   A-fib (HCC)    Anginal pain (HCC)    Aortic insufficiency    a. 02/2020 Echo: EF 60-65%, no rwma, Gr1 DD, nl RV fxn, mildly dil LA, triv AI, Asc Ao 39mm; b. 04/2021 Echo: EF 60-65%, no rwma, GrI DD, nl RV fxn, mildly dil LA, AI not visualized. Mild-mod AoV sclerosis w/o stenosis.   Arthritis    Knees   B12 deficiency    Mild   Bucket-handle tear of lateral meniscus of left knee as current injury 12/30/2012   Cancer (HCC)    Skin, basal cell on nose and eyelid   Colitis    COVID    DJD (degenerative joint disease)    Low back   Dyspepsia    GERD (gastroesophageal reflux disease)    Heart murmur    Heart palpitations    History of cardiac cath    a. 02/2017 Cath: Nl cors. Nl LV fxn. Abd Aogram w/o RAS.   HOH (hard of hearing)    Hyperlipidemia    Hypertension    IBS (irritable bowel syndrome)    Lactose intolerance    Mixed incontinence    PONV (postoperative nausea and vomiting)    Urinary tract infection    Vulvar irritation    from urine pad, uses Triamcinolone  oint.    Current Outpatient Rx   Order #: [id number] Class: Normal   Order #: [id number] Class: Historical Med   Order #: [id number] Class: Normal   Order #: [id number] Class: Historical Med   Order #: [id number] Class: Normal   Order #:  [id number] Class: Historical Med   Order #: [id number] Class: Normal   Order #: [id number] Class: Phone In   Order #: [id number] Class: Historical Med   Order #: [id number] Class: Historical Med   Order #: [id number] Class: Normal   Order #: [id number] Class: Normal   Order #: [id number] Class: Normal   Order #: [id number] Class: Historical Med   Order #: [id number] Class: Normal   Order #: [id number] Class: Normal   Order #: [id number] Class: Normal   Order #: [id number] Class: Normal   Order #: [id number] Class: Normal   Order #: [id number] Class: Normal   Order #: [id number] Class: Normal    Past Surgical History:  Procedure Laterality Date   ABDOMINAL AORTOGRAM N/A 03/15/2017   Procedure: Abdominal Aortogram;  Surgeon: Wenona Hamilton, MD;  Location: [facility] INVASIVE CV LAB;  Service: Cardiovascular;  Laterality: N/A;   ABDOMINAL HYSTERECTOMY  1982   Large fibroids and bleeding   APPENDECTOMY     BILATERAL SALPINGOOPHORECTOMY  1986   BONE GRAFT HIP ILIAC CREST     To Left arm   BREAST REDUCTION SURGERY     BREAST SURGERY     CARDIAC CATHETERIZATION     CATARACT EXTRACTION W/PHACO  Left 07/07/2016   Procedure: CATARACT EXTRACTION PHACO AND INTRAOCULAR LENS PLACEMENT (IOC);  Surgeon: Clair Crews, MD;  Location: [facility] ORS;  Service: Ophthalmology;  Laterality: Left;  US  01:10 AP% 18.3 CDE 12.91 Fluid pak lot # [id number]   CATARACT EXTRACTION W/PHACO Right 07/28/2016   Procedure: CATARACT EXTRACTION PHACO AND INTRAOCULAR LENS PLACEMENT (IOC);  Surgeon: Clair Crews, MD;  Location: [facility] ORS;  Service: Ophthalmology;  Laterality: Right;  US  01:26 AP% 18.5 CDE 16.12 Fluid pack lot # [id number]   COLONOSCOPY  07/2016   Dr. Aldean Hummingbird   COLONOSCOPY WITH PROPOFOL  N/A 04/01/2023   Procedure: COLONOSCOPY WITH PROPOFOL ;  Surgeon: Selena Daily, MD;  Location: [facility] ENDOSCOPY;  Service: Gastroenterology;  Laterality: N/A;   DILATION AND CURETTAGE OF UTERUS     miscarragesx2   ESOPHAGOGASTRODUODENOSCOPY     Polyps    HEMORRHOID SURGERY  2006   KNEE ARTHROSCOPY WITH LATERAL MENISECTOMY Left 12/30/2012   Procedure: KNEE ARTHROSCOPY WITH LATERAL MENISECTOMY;  Surgeon: Neville Barbone, MD;  Location: [facility];  Service: Orthopedics;  Laterality: Left;  LEFT KNEE SCOPE LATERAL MENISCECTOMY   LEFT HEART CATH AND CORONARY ANGIOGRAPHY N/A 03/15/2017   Procedure: Left Heart Cath and Coronary Angiography;  Surgeon: Wenona Hamilton, MD;  Location: [facility] INVASIVE CV LAB;  Service: Cardiovascular;  Laterality: N/A;   MOUTH SURGERY     skin cancer eyelid  2012   TONSILLECTOMY      Physical Exam   Triage Vital Signs: ED Triage Vitals  Encounter Vitals Group     BP 03/15/24 1257 (!) 142/69     Systolic BP Percentile --      Diastolic BP Percentile --      Pulse Rate 03/15/24 1257 89     Resp 03/15/24 1257 18     Temp 03/15/24 1257 98.2 F (36.8 C)     Temp src --      SpO2 03/15/24 1257 99 %     Weight --      Height --      Head Circumference --      Peak Flow --      Pain Score 03/15/24 1255 (S) 7     Pain Loc --      Pain Education --      Exclude from Growth Chart --     Most recent vital signs: Vitals:   03/15/24 1257  BP: (!) 142/69  Pulse: 89  Resp: 18  Temp: 98.2 F (36.8 C)  SpO2: 99%    General: Awake, no distress.  CV:  Good peripheral perfusion.  Regular rate Resp:  Normal effort.  Clear to auscultation bilaterally Abd:  No distention.  Soft nontender Other:  No hip tenderness.  Long bones are stable.  Full range of motion all extremities.  There is some tenderness in the left proximal tibia in the area of a superficial skin tear.  There is a skin tear over the extensor surface of the left elbow without bony tenderness.  Intact flexion extension pronation supination.   ED Results / Procedures / Treatments   Labs (all labs ordered are listed, but only abnormal results are displayed) Labs Reviewed - No data to display   EKG    RADIOLOGY X-ray left tibia  negative for fracture   PROCEDURES:  Procedures   MEDICATIONS ORDERED IN ED: Medications  Tdap (BOOSTRIX) injection 0.5 mL (has no administration in time range)     IMPRESSION / MDM / ASSESSMENT AND PLAN /  ED COURSE  I reviewed the triage vital signs and the nursing notes.  DDx: Skin tear, tibia fracture, fibula fracture, contusion  Patient's presentation is most consistent with acute presentation with potential threat to life or bodily function.  Presents with skin tears after mechanical fall at home.  No other acute symptoms, vital signs are unremarkable, exam is reassuring.  X-ray obtained of left tib-fib which was unremarkable.  Tetanus updated, stable for discharge.  Counseled patient on wound care, wounds dressed in the ED.       FINAL CLINICAL IMPRESSION(S) / ED DIAGNOSES   Final diagnoses:  Fall in home, initial encounter  Skin tear of left elbow without complication, initial encounter  Skin tear of left lower leg without complication, initial encounter     Rx / DC Orders   ED Discharge Orders     None        Note:  This document was prepared using Dragon voice recognition software and may include unintentional dictation errors.   Jacquie Maudlin, MD 03/15/24 1435

## 2024-03-16 ENCOUNTER — Telehealth: Payer: Self-pay

## 2024-03-16 NOTE — Transitions of Care (Post Inpatient/ED Visit) (Signed)
 03/16/2024  Name: Tammy Manning MRN: [id number] DOB: 12-13-1934  Today's TOC FU Call Status: Today's TOC FU Call Status:: Successful TOC FU Call Completed TOC FU Call Complete Date: 03/16/24 Patient's Name and Date of Birth confirmed.  Transition Care Management Follow-up Telephone Call Date of Discharge: 03/15/24 Discharge Facility: [facility]) Type of Discharge: Emergency Department Reason for ED Visit: Other: (fall) How have you been since you were released from the hospital?: Better Any questions or concerns?: No  Items Reviewed: Did you receive and understand the discharge instructions provided?: Yes Medications obtained,verified, and reconciled?: Yes (Medications Reviewed) Any new allergies since your discharge?: No Dietary orders reviewed?: Yes People in Home [RPT]: child(ren), adult  Medications Reviewed Today: Medications Reviewed Today     Reviewed by Darrall Ellison, LPN (Licensed Practical Nurse) on 03/16/24 at 1410  Med List Status: <None>   Medication Order Taking? Sig Documenting Provider Last Dose Status Informant  atorvastatin  (LIPITOR) 10 MG tablet [id number] No Take 1 tablet (10 mg total) by mouth daily. [name], Manley Seeds, MD Taking Active   Calcium  Carbonate-Vitamin D  (CALCIUM  600+D PO) [id number] No Take 1 tablet by mouth daily.  [provider] Taking Active Self  CARTIA  XT 180 MG 24 hr capsule [id number] No TAKE 1 CAPSULE(180 MG) BY MOUTH DAILY Arida, Muhammad A, MD Taking Active   clobetasol  ointment (TEMOVATE ) 0.05 % [id number] No Apply 1 application topically 2 (two) times daily as needed. [provider] Taking Active Self  clorazepate  (TRANXENE ) 7.5 MG tablet [id number] No Take 1 tablet (7.5 mg total) by mouth daily as needed. [name], Manley Seeds, MD Taking Active   cyanocobalamin  (,VITAMIN B-12,) 1000 MCG/ML injection [id number] No Inject 1,000 mcg into the muscle every 30 (thirty) days.  [provider]  Taking Active Self  ELIQUIS  5 MG TABS tablet [id number]  TAKE 1 TABLET BY MOUTH TWICE DAILY [name], MD  Active   estradiol  (ESTRACE ) 0.1 MG/GM vaginal cream [id number] No Apply a pea-sized amount to fingertip and wipe in vaginal introitus twice weekly McGowan, Cathleen Coach A, PA-C Taking Active   famotidine  (PEPCID ) 20 MG tablet [id number] No Take 20 mg by mouth 2 (two) times daily as needed for heartburn or indigestion. [provider] Taking Active   guaiFENesin (MUCINEX) 600 MG 12 hr tablet [id number] No Take 600 mg by mouth 2 (two) times daily as needed. [provider] Taking Active            Med Note Woodward Head, BRENDA L   Tue Dec 28, 2023  1:57 PM) prn  hydrocortisone  (ANUSOL -HC) 2.5 % rectal cream [id number] No Apply 1 Application topically at bedtime. To affected area for 10 days [name], Manley Seeds, MD Taking Active   hydrocortisone  (ANUSOL -HC) 25 MG suppository [id number] No Place 1 suppository (25 mg total) rectally 2 (two) times daily. [name], Manley Seeds, MD Taking Active   irbesartan  (AVAPRO ) 300 MG tablet [id number] No TAKE 1 TABLET BY MOUTH DAILY [name], Manley Seeds, MD Taking Active   loperamide  (IMODIUM ) 2 MG capsule [id number] No Take 2 mg by mouth as needed for diarrhea or loose stools. [provider] Taking Active   methotrexate  250 MG/10ML injection [id number] No INJECT 0.6 MLS INTO THE SKIN ONCE A WEEK Rice, Haig Levan, MD Taking Active   nystatin  (MYCOSTATIN ) 100000 UNIT/ML suspension [id number] No Take 5 mLs (500,000 Units total) by mouth 3 (three) times daily. Swish and swallow [name], Manley Seeds, MD Taking Active   ondansetron  (ZOFRAN ) 4 MG  tablet [id number] No Take 1 tablet (4 mg total) by mouth every 8 (eight) hours as needed. Kandee Orion, MD Taking Active   predniSONE  (DELTASONE ) 10 MG tablet [id number]  Take 3 pills once daily by mouth for 3 days, then 2 pills once daily for 3 days, then 1 pill once daily for 3 days and then stop [name], Manley Seeds, MD  Active    triamcinolone  (NASACORT ) 55 MCG/ACT AERO nasal inhaler [id number] No Place 2 sprays into the nose daily. [name], Manley Seeds, MD Taking Active   trimethoprim  (TRIMPEX ) 100 MG tablet [id number] No Take 1 tablet (100 mg total) by mouth daily. [name], Manley Seeds, MD Taking Active   Tuberculin-Allergy  Syringes 27G X 1/2" 1 ML MISC [id number] No Use 1 syringe once weekly to inject methotrexate . Matt Song, MD Taking Active             Home Care and Equipment/Supplies: Were Home Health Services Ordered?: NA Any new equipment or medical supplies ordered?: NA  Functional Questionnaire: Do you need assistance with bathing/showering or dressing?: No Do you need assistance with meal preparation?: No Do you need assistance with eating?: No Do you have difficulty maintaining continence: No Do you need assistance with getting out of bed/getting out of a chair/moving?: No Do you have difficulty managing or taking your medications?: No  Follow up appointments reviewed: PCP Follow-up appointment confirmed?: No (declined) MD Provider Line Number:[phone] Given: No Specialist Hospital Follow-up appointment confirmed?: NA Do you need transportation to your follow-up appointment?: No Do you understand care options if your condition(s) worsen?: Yes-patient verbalized understanding    SIGNATURE Darrall Ellison, LPN [facility] Nurse Health Advisor Direct Dial [phone]

## 2024-03-20 DIAGNOSIS — M542 Cervicalgia: Secondary | ICD-10-CM | POA: Diagnosis not present

## 2024-03-23 ENCOUNTER — Other Ambulatory Visit: Payer: Self-pay | Admitting: Family Medicine

## 2024-03-23 NOTE — Telephone Encounter (Signed)
 Name of Medication: Tranxene  Name of Pharmacy: [facility] Last Fill or Written Date and Quantity: 10/25/23 #30 tabs/ 0 refills  Last Office Visit and Type: Hospital f/u 02/23/24 Next Office Visit and Type: none scheduled :

## 2024-03-24 ENCOUNTER — Other Ambulatory Visit

## 2024-03-24 ENCOUNTER — Ambulatory Visit (INDEPENDENT_AMBULATORY_CARE_PROVIDER_SITE_OTHER): Admitting: *Deleted

## 2024-03-24 DIAGNOSIS — E538 Deficiency of other specified B group vitamins: Secondary | ICD-10-CM

## 2024-03-24 MED ORDER — CYANOCOBALAMIN 1000 MCG/ML IJ SOLN
1000.0000 ug | Freq: Once | INTRAMUSCULAR | Status: AC
  Administered 2024-03-24: 1000 ug via INTRAMUSCULAR

## 2024-03-24 NOTE — Progress Notes (Signed)
 Per orders of Dr. Herby Lolling (PCP out of the office today), injection of B-12 given by Georg Killian M in left deltoid. Patient tolerated injection well. Patient will make appointment for 1 month.

## 2024-03-28 ENCOUNTER — Encounter: Payer: Self-pay | Admitting: Family Medicine

## 2024-03-28 MED ORDER — FLUCONAZOLE 200 MG PO TABS: 200.0000 mg | ORAL_TABLET | Freq: Every day | ORAL | 0 refills | Status: DC

## 2024-03-30 ENCOUNTER — Ambulatory Visit: Attending: Cardiovascular Disease | Admitting: Cardiovascular Disease

## 2024-03-30 ENCOUNTER — Encounter: Payer: Self-pay | Admitting: Cardiovascular Disease

## 2024-03-30 VITALS — BP 115/61 | HR 77 | Ht 64.0 in | Wt 168.4 lb

## 2024-03-30 DIAGNOSIS — I480 Paroxysmal atrial fibrillation: Secondary | ICD-10-CM | POA: Diagnosis not present

## 2024-03-30 DIAGNOSIS — I10 Essential (primary) hypertension: Secondary | ICD-10-CM | POA: Insufficient documentation

## 2024-03-30 NOTE — Patient Instructions (Signed)
 Medication Instructions:  Your Physician recommend you continue on your current medication as directed.    *If you need a refill on your cardiac medications before your next appointment, please call your pharmacy*  Lab Work: No labs ordered today  If you have labs (blood work) drawn today and your tests are completely normal, you will receive your results only by: MyChart Message (if you have MyChart) OR A paper copy in the mail If you have any lab test that is abnormal or we need to change your treatment, we will call you to review the results.  Testing/Procedures: No test ordered today   Follow-Up: At [facility], you and your health needs are our priority.  As part of our continuing mission to provide you with exceptional heart care, our providers are all part of one team.  This team includes your primary Cardiologist (physician) and Advanced Practice Providers or APPs (Physician Assistants and Nurse Practitioners) who all work together to provide you with the care you need, when you need it.  Your next appointment:   6 month(s)  Provider:   Sammy Crisp, MD

## 2024-03-30 NOTE — Progress Notes (Signed)
 Cardiology Office Note   Date:  03/30/2024   ID:  Tammy Manning, DOB Jul 23, 1935, MRN [id number]  PCP:  Clemens Curt, MD  Cardiologist:  Antionette Kirks, MD   No chief complaint on file.      History of Present Illness: Tammy Manning is a 89 y.o. female who presents for a follow-up visit regarding paroxysmal atrial fibrillation.  She has intolerance to multiple antihypertensive medications.  Cardiac catheterization in April 2018 showed normal coronary arteries and normal LV systolic function.  Abdominal aortogram showed no evidence of renal artery stenosis.    Previous echocardiogram in June 2019 showed an EF of 55 - 60%, mild LVH and mild aortic insufficiency.    She was hospitalized in June of 2022 for atrial fibrillation.  She took an extra dose of chlorthalidone  for leg edema and presented with increased fatigue and palpitations.  She was noted to be in atrial fibrillation with RVR.  She was started on diltiazem  drip and converted to sinus rhythm.  She was noted to be mildly hypokalemic. The patient was switched from chlorthalidone  to diltiazem .  CHA2DS2-VASc score was 4 and thus she was started on anticoagulation with Eliquis  5 mg twice daily.  She had COVID in September and felt significant shortness of breath and fatigue after that.  Echocardiogram in November showed normal LV systolic function with mildly calcified aortic valve without significant stenosis and mild aortic insufficiency.  She had a mechanical fall in April. She went to the ED and had a hip x ray done which showed no fractures.  She is doing well with no chest pain or SOB.   Past Medical History:  Diagnosis Date   A-fib (HCC)    Anginal pain (HCC)    Aortic insufficiency    a. 02/2020 Echo: EF 60-65%, no rwma, Gr1 DD, nl RV fxn, mildly dil LA, triv AI, Asc Ao 39mm; b. 04/2021 Echo: EF 60-65%, no rwma, GrI DD, nl RV fxn, mildly dil LA, AI not visualized. Mild-mod AoV sclerosis w/o stenosis.   Arthritis     Knees   B12 deficiency    Mild   Bucket-handle tear of lateral meniscus of left knee as current injury 12/30/2012   Cancer (HCC)    Skin, basal cell on nose and eyelid   Colitis    COVID    DJD (degenerative joint disease)    Low back   Dyspepsia    GERD (gastroesophageal reflux disease)    Heart murmur    Heart palpitations    History of cardiac cath    a. 02/2017 Cath: Nl cors. Nl LV fxn. Abd Aogram w/o RAS.   HOH (hard of hearing)    Hyperlipidemia    Hypertension    IBS (irritable bowel syndrome)    Lactose intolerance    Mixed incontinence    PONV (postoperative nausea and vomiting)    Urinary tract infection    Vulvar irritation    from urine pad, uses Triamcinolone  oint.    Past Surgical History:  Procedure Laterality Date   ABDOMINAL AORTOGRAM N/A 03/15/2017   Procedure: Abdominal Aortogram;  Surgeon: Wenona Hamilton, MD;  Location: [facility] INVASIVE CV [facility];  Service: Cardiovascular;  Laterality: N/A;   ABDOMINAL HYSTERECTOMY  1982   Large fibroids and bleeding   APPENDECTOMY     BILATERAL SALPINGOOPHORECTOMY  1986   BONE GRAFT HIP ILIAC CREST     To Left arm   BREAST REDUCTION SURGERY     BREAST SURGERY  CARDIAC CATHETERIZATION     CATARACT EXTRACTION W/PHACO Left 07/07/2016   Procedure: CATARACT EXTRACTION PHACO AND INTRAOCULAR LENS PLACEMENT (IOC);  Surgeon: Clair Crews, MD;  Location: [facility] ORS;  Service: Ophthalmology;  Laterality: Left;  US  01:10 AP% 18.3 CDE 12.91 Fluid pak lot # [id number]   CATARACT EXTRACTION W/PHACO Right 07/28/2016   Procedure: CATARACT EXTRACTION PHACO AND INTRAOCULAR LENS PLACEMENT (IOC);  Surgeon: Clair Crews, MD;  Location: [facility] ORS;  Service: Ophthalmology;  Laterality: Right;  US  01:26 AP% 18.5 CDE 16.12 Fluid pack lot # [id number]   COLONOSCOPY  07/2016   Dr. Aldean Hummingbird   COLONOSCOPY WITH PROPOFOL  N/A 04/01/2023   Procedure: COLONOSCOPY WITH PROPOFOL ;  Surgeon: Selena Daily, MD;  Location: [facility];  Service:  Gastroenterology;  Laterality: N/A;   DILATION AND CURETTAGE OF UTERUS     miscarragesx2   ESOPHAGOGASTRODUODENOSCOPY     Polyps   HEMORRHOID SURGERY  2006   KNEE ARTHROSCOPY WITH LATERAL MENISECTOMY Left 12/30/2012   Procedure: KNEE ARTHROSCOPY WITH LATERAL MENISECTOMY;  Surgeon: Neville Barbone, MD;  Location: [facility];  Service: Orthopedics;  Laterality: Left;  LEFT KNEE SCOPE LATERAL MENISCECTOMY   LEFT HEART CATH AND CORONARY ANGIOGRAPHY N/A 03/15/2017   Procedure: Left Heart Cath and Coronary Angiography;  Surgeon: Wenona Hamilton, MD;  Location: [facility];  Service: Cardiovascular;  Laterality: N/A;   MOUTH SURGERY     skin cancer eyelid  2012   TONSILLECTOMY       Current Outpatient Medications  Medication Sig Dispense Refill   atorvastatin  (LIPITOR) 10 MG tablet Take 1 tablet (10 mg total) by mouth daily. 90 tablet 3   Calcium  Carbonate-Vitamin D  (CALCIUM  600+D PO) Take 1 tablet by mouth daily.      CARTIA  XT 180 MG 24 hr capsule TAKE 1 CAPSULE(180 MG) BY MOUTH DAILY 90 capsule 3   clobetasol  ointment (TEMOVATE ) 0.05 % Apply 1 application topically 2 (two) times daily as needed.     clorazepate  (TRANXENE ) 7.5 MG tablet TAKE ONE TABLET BY MOUTH ONCE DAILY AS NEEDED 30 tablet 0   cyanocobalamin  (,VITAMIN B-12,) 1000 MCG/ML injection Inject 1,000 mcg into the muscle every 30 (thirty) days.      ELIQUIS  5 MG TABS tablet TAKE 1 TABLET BY MOUTH TWICE DAILY 180 tablet 1   estradiol  (ESTRACE ) 0.1 MG/GM vaginal cream Apply a pea-sized amount to fingertip and wipe in vaginal introitus twice weekly 42.5 g 1   famotidine  (PEPCID ) 20 MG tablet Take 20 mg by mouth 2 (two) times daily as needed for heartburn or indigestion.     fluconazole  (DIFLUCAN ) 200 MG tablet Take 1 tablet (200 mg total) by mouth daily. 7 tablet 0   guaiFENesin (MUCINEX) 600 MG 12 hr tablet Take 600 mg by mouth 2 (two) times daily as needed.     hydrocortisone  (ANUSOL -HC) 2.5 % rectal cream Apply  1 Application topically at bedtime. To affected area for 10 days 30 g 0   hydrocortisone  (ANUSOL -HC) 25 MG suppository Place 1 suppository (25 mg total) rectally 2 (two) times daily. 10 suppository 0   irbesartan  (AVAPRO ) 300 MG tablet TAKE 1 TABLET BY MOUTH DAILY 90 tablet 2   loperamide  (IMODIUM ) 2 MG capsule Take 2 mg by mouth as needed for diarrhea or loose stools.     methotrexate  250 MG/10ML injection INJECT 0.6 MLS INTO THE SKIN ONCE A WEEK 4 mL 2   nystatin  (MYCOSTATIN ) 100000 UNIT/ML suspension Take 5 mLs (500,000 Units total)  by mouth 3 (three) times daily. Swish and swallow 120 mL 1   ondansetron  (ZOFRAN ) 4 MG tablet Take 1 tablet (4 mg total) by mouth every 8 (eight) hours as needed. 15 tablet 0   predniSONE  (DELTASONE ) 10 MG tablet Take 3 pills once daily by mouth for 3 days, then 2 pills once daily for 3 days, then 1 pill once daily for 3 days and then stop 18 tablet 0   triamcinolone  (NASACORT ) 55 MCG/ACT AERO nasal inhaler Place 2 sprays into the nose daily. 1 each 12   trimethoprim  (TRIMPEX ) 100 MG tablet Take 1 tablet (100 mg total) by mouth daily. 30 tablet 2   Tuberculin-Allergy  Syringes 27G X 1/2" 1 ML MISC Use 1 syringe once weekly to inject methotrexate . 25 each 1   No current facility-administered medications for this visit.    Allergies:   Antihistamines, chlorpheniramine-type; Atenolol; Cefuroxime  axetil; Ciprofloxacin; Co q 10 [coenzyme q10]; Crestor  [rosuvastatin  calcium ]; Dextromethorphan-guaifenesin; Elavil  [amitriptyline ]; Epinephrine ; Methenamine ; Ramipril; Sulfamethoxazole-trimethoprim ; and Vitamin d  analogs    Social History:  The patient  reports that she quit smoking about 60 years ago. Her smoking use included cigarettes. She started smoking about 72 years ago. She has a 12 pack-year smoking history. She has never been exposed to tobacco smoke. She has never used smokeless tobacco. She reports that she does not drink alcohol and does not use drugs.   Family  History:  The patient's family history includes Alzheimer's disease in her sister; Breast cancer in her daughter; Cancer in her mother and paternal aunt; Diabetes in her brother, brother, sister, and sister; Esophageal cancer in her brother; Heart disease in her father; Hypothyroidism in her sister; Leukemia in her brother.    ROS:  Please see the history of present illness.   Otherwise, review of systems are positive for none.   All other systems are reviewed and negative.    PHYSICAL EXAM: VS:  BP 115/61 (BP Location: Left Arm, Patient Position: Sitting, Cuff Size: Normal)   Pulse 77   Ht 5\' 4"  (1.626 m)   Wt 168 lb 6.4 oz (76.4 kg)   SpO2 98%   BMI 28.91 kg/m  , BMI Body mass index is 28.91 kg/m. GEN: Well nourished, well developed, in no acute distress  HEENT: normal  Neck: no JVD, carotid bruits, or masses Cardiac: RRR; no  rubs, or gallops,no edema, 2/6 systolic murmur in the aortic area. Respiratory:  clear to auscultation bilaterally, normal work of breathing GI: soft, nontender, nondistended, + BS MS: no deformity or atrophy  Skin: warm and dry, no rash Neuro:  Strength and sensation are intact Psych: euthymic mood, full affect   EKG:  EKG is ordered today. The ekg ordered today demonstrates : Normal sinus rhythm Minimal voltage criteria for LVH, may be normal variant ( R in aVL ) When compared with ECG of 30-Sep-2023 13:51, No significant change was found   Recent Labs: 01/11/2024: ALT 14; BUN 22; Creat 0.88; Hemoglobin 11.3; Platelets 348; Potassium 5.1; Sodium 137 01/27/2024: TSH 0.85    Lipid Panel    Component Value Date/Time   CHOL 229 (H) 01/27/2024 1135   CHOL 196 02/02/2020 1348   TRIG 150.0 (H) 01/27/2024 1135   HDL 88.30 01/27/2024 1135   HDL 80 02/02/2020 1348   CHOLHDL 3 01/27/2024 1135   VLDL 30.0 01/27/2024 1135   LDLCALC 111 (H) 01/27/2024 1135   LDLCALC 93 02/02/2020 1348   LDLDIRECT 106 (H) 02/02/2020 1348   LDLDIRECT 105.9 02/08/2013  1050       Wt Readings from Last 3 Encounters:  03/30/24 168 lb 6.4 oz (76.4 kg)  02/23/24 171 lb (77.6 kg)  02/19/24 169 lb (76.7 kg)           No data to display            ASSESSMENT AND PLAN:   1.  Paroxysmal atrial fibrillation: She is doing well overall with minimal palpitations at the present time.  Continue current dose of diltiazem .  Continue anticoagulation with Eliquis  5 mg twice daily.   She fell recently but fortunately no serious injuries.  I discussed with her the importance of being cautious as avoiding falls and injuries as much as possible.  2. Essential hypertension:   Blood pressure is well controlled on diltiazem  and irbesartan .  3. Hyperlipidemia: No documented history of coronary artery disease.  Most recent lipid profile showed an LDL of 111.  She is currently on atorvastatin  10 mg daily.     Disposition:   FU in 6 months   Signed,  Antionette Kirks, MD 03/30/24 [facility] [place], [place] [phone]

## 2024-04-04 ENCOUNTER — Ambulatory Visit: Payer: Medicare Other | Admitting: Cardiovascular Disease

## 2024-04-04 NOTE — Progress Notes (Deleted)
 Office Visit Note  Patient: Tammy Manning             Date of Birth: 11-Sep-1935           MRN: [id number]             PCP: Clemens Curt, MD Referring: [name], Manley Seeds, MD Visit Date: 04/18/2024   Subjective:  No chief complaint on file.   History of Present Illness: Tammy Manning is a 89 y.o. female here for follow up for seropositive RA on methotrexate  15 mg subcu weekly.     Previous HPI 01/11/2024 Tammy Manning is a 89 y.o. female here for follow up for seropositive RA on methotrexate  15 mg subcu weekly.     She is currently on methotrexate  0.6 mL (15 mg) for rheumatoid arthritis, which effectively manages her symptoms, particularly shoulder pain. However, she dislikes the medication due to hair loss and is interested in potentially reducing the dosage.   She has a persistent urinary tract infection and is on a continuous antibiotic regimen with trimethoprim . Despite this, she reports no significant issues with the antibiotic treatment.   She recently had a sinus infection treated with a heavy antibiotic, which has since resolved. No further treatment is necessary at this time.   She sustained a bruise and skin abrasion on her leg from a cart accident. She managed the wound herself and notes that it is healing without signs of infection. She is on Eliquis , which contributes to bruising.   She takes B12 shots monthly and uses Centrum after fifty vitamins, which do not contain iron, as recommended by her daughter who had breast cancer. She has been unable to take folic acid  due to previous issues.     Previous HPI 10/08/2023 The patient, an 89 year old with a history of chronic urinary tract infections (UTIs), rheumatoid arthritis, and atrial fibrillation, presents for follow-up for seropositive RA on methotrexate  15 mg subcu weekly. They report a recent bout of COVID-19, which was slow to resolve and required them to temporarily discontinue methotrexate . During this  period, they experienced a resurgence of joint pain, particularly in the shoulder. Upon resuming methotrexate , the patient reports significant improvement in their symptoms.   She remains on indefinite antibiotics for chronic UTI. They are currently on a daily low-dose antibiotic regimen for maintenance.   The patient also mentions some joint pain in the right hand and shoulder, which was more pronounced when they were off methotrexate  during their COVID-19 illness. They report minimal morning stiffness and are able to maintain their daily routine, including walking their dog.  She had recent repeat knee injections with her orthopedic clinic for chronic osteoarthritis pain.   Recent CBC and CMP checked in October WBC mildly elevated 11.4 metabolic panel was normal.   Previous HPI 07/07/2023 Tammy Manning is a 89 y.o. female here for follow up for seropositive RA on methotrexate  15 mg subcu weekly.  Joint symptoms have been pretty stable without serious exacerbation or swelling. She has pain in both hips and had a steroid injection on the right side about one month ago with a good improvement. Left side is bothering her currently but not as badly as sometimes. Noticeable both at night and when walking.  Has ongoing problems since the colitis with constipation sometimes for a week and then abdominal pain and diarrhea. Also with chronic UTI with frequency and incontinence but no painful urination or visible abnormality.   Previous HPI 03/30/2023 Tammy Manning is a 89 y.o.  female here for follow up for seropositive RA on methotrexate  15 mg subcu weekly.  Not on folic acid  due to intolerance of oral supplement.  Overall arthritis symptoms are doing well.  She had recent bilateral knee steroid injections with Dr. Agatha Horsfall with good improvement.  Currently no active complaint about the neck shoulder or hand pain.  She had an episode of colitis no specific causative infection or inflammatory process  identified.  This has resolved but is now scheduled for colonoscopy on Thursday.     Previous HPI 12/30/22 Tammy Manning is a 89 y.o. female here for follow up for seropositive RA on methotrexate  15 mg subcu weekly and folic acid  1 mg daily.  Since her last visit she still had some ongoing pain most persistently in the neck and shoulders bilaterally but without any severe flareup compared to before starting treatment.  She has been experiencing more pain affecting her left knee where there is known severe osteoarthritis.  She started taking a lot of Tylenol  about 3 times every day 3 to 4 weeks ago but started to develop right-sided flank pain and diarrhea with yellow discolored stools.  She decreased to taking Tylenol  no more than once daily with improvement of the symptoms.  She saw Dr. Agatha Horsfall with intra-articular steroid injection 2 weeks ago with a good improvement in symptoms.  She has not had any significant interval infections.     Previous HPI 03/09/22 Tammy Manning is a 89 y.o. female here for follow up for seropositive RA on MTX 15 mg [place] weekly. She is doing very well since last visit joint pains almost all better except bilateral hips bothering her intermittently. Left knee also creaky but not much inflammation problem. She has persistent stomach irritation, not much relief with taking pepcid  as needed. [facility] not able to fill methotrexate  Rx from supply issue but she still had medication on hand. She saw urology recently with persistent abnormal UA and cultures but mostly asymptomatic.   Previous HPI 12/29/21 Tammy Manning is a 89 y.o. female here for follow up for RA after restarting methotrexate  at 15 mg [place] weekly, which had been delayed due to repeated interruptions from UTI infections and antibiotic treatment. She has noticed improvement in joint symptoms after about 2 weeks taking the methotrexate . She continues to have some nausea when she takes this  despite subcutaneous route.   Previous HPI 11/25/20 Tammy Manning is a 89 y.o. female with a history of afib, TIA, and OA here for evaluation of joint pain and positive rheumatoid factor. She has joint pain in multiple sites including both hands but also has a lot of pain with the right shoulder that started more acutely with suspected rotator cuff tear. 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 She does notice swelling in hands intermittently. She has morning stiffness lasting less than 30 minutes daily also persistent pain throughout the day. She has had left radius fracture with surgical repair but no other major surgery of her arms.   No Rheumatology ROS completed.   PMFS History:  Patient Active Problem List   Diagnosis Date Noted   Neck pain 03/02/2024   Pain of left scapula 02/23/2024   Rhomboid muscle pain 02/23/2024   Diabetes mellitus screening 01/27/2024   Thrush 01/27/2024   Anxious mood 09/20/2023   Tremor of both hands 09/20/2023   Head injury 04/28/2023   Superficial injury of skin 04/21/2023  Diverticulosis 03/21/2023   Chronic diastolic heart failure (HCC) 03/13/2023   Obesity (BMI 30-39.9) 03/13/2023   PAF (paroxysmal atrial fibrillation) (HCC) 03/13/2023   Recurrent UTI (urinary tract infection) 02/18/2023   Intertrigo 02/16/2023   Hemorrhoids 02/02/2023   Urinary incontinence 02/02/2023   Dilated aortic root (HCC) 01/26/2023   Osteopenia 01/26/2023   Multinodular goiter 02/27/2022   Encounter for screening mammogram for breast cancer 01/23/2022   High risk medication use 10/07/2021   Atrial fibrillation with RVR (HCC) 04/28/2021   Hypertrophic toenail 04/22/2021   Seropositive rheumatoid arthritis (HCC) 11/25/2020   Bilateral hand pain 11/25/2020   Bilateral shoulder pain 10/16/2020   Joint pain 10/16/2020   Estrogen deficiency 04/30/2020   Dry mouth 03/15/2020   Osteoarthritis of right hip  01/02/2020   Right thyroid  nodule 05/12/2018   History of TIA (transient ischemic attack) 05/09/2018   Aortic insufficiency 09/10/2015   Stress reaction 09/08/2015   Encounter for Medicare annual wellness exam 02/28/2013   Bucket-handle tear of lateral meniscus of left knee as current injury 12/30/2012   Episodic atrial fibrillation (HCC) 11/17/2012   Hearing loss of both ears 01/14/2012   B12 deficiency 03/17/2010   GERD (gastroesophageal reflux disease) 03/17/2010   POSTMENOPAUSAL STATUS 08/20/2008   Essential hypertension 07/19/2007   Hyperlipidemia 06/24/2007   IBS 06/24/2007   DERMATITIS, ATOPIC 06/24/2007   SKIN CANCER, HX OF 06/24/2007    Past Medical History:  Diagnosis Date   A-fib (HCC)    Anginal pain (HCC)    Aortic insufficiency    a. 02/2020 Echo: EF 60-65%, no rwma, Gr1 DD, nl RV fxn, mildly dil LA, triv AI, Asc Ao 39mm; b. 04/2021 Echo: EF 60-65%, no rwma, GrI DD, nl RV fxn, mildly dil LA, AI not visualized. Mild-mod AoV sclerosis w/o stenosis.   Arthritis    Knees   B12 deficiency    Mild   Bucket-handle tear of lateral meniscus of left knee as current injury 12/30/2012   Cancer (HCC)    Skin, basal cell on nose and eyelid   Colitis    COVID    DJD (degenerative joint disease)    Low back   Dyspepsia    GERD (gastroesophageal reflux disease)    Heart murmur    Heart palpitations    History of cardiac cath    a. 02/2017 Cath: Nl cors. Nl LV fxn. Abd Aogram w/o RAS.   HOH (hard of hearing)    Hyperlipidemia    Hypertension    IBS (irritable bowel syndrome)    Lactose intolerance    Mixed incontinence    PONV (postoperative nausea and vomiting)    Urinary tract infection    Vulvar irritation    from urine pad, uses Triamcinolone  oint.    Family History  Problem Relation Age of Onset   Cancer Mother        Colon   Heart disease Father        CAD   Diabetes Sister    Hypothyroidism Sister    Diabetes Sister    Alzheimer's disease Sister     Diabetes Brother    Leukemia Brother    Esophageal cancer Brother    Diabetes Brother    Cancer Paternal Aunt        Breast   Breast cancer Daughter    Past Surgical History:  Procedure Laterality Date   ABDOMINAL AORTOGRAM N/A 03/15/2017   Procedure: Abdominal Aortogram;  Surgeon: Wenona Hamilton, MD;  Location: [facility];  Service: Cardiovascular;  Laterality: N/A;   ABDOMINAL HYSTERECTOMY  1982   Large fibroids and bleeding   APPENDECTOMY     BILATERAL SALPINGOOPHORECTOMY  1986   BONE GRAFT HIP ILIAC CREST     To Left arm   BREAST REDUCTION SURGERY     BREAST SURGERY     CARDIAC CATHETERIZATION     CATARACT EXTRACTION W/PHACO Left 07/07/2016   Procedure: CATARACT EXTRACTION PHACO AND INTRAOCULAR LENS PLACEMENT (IOC);  Surgeon: Clair Crews, MD;  Location: [facility] ORS;  Service: Ophthalmology;  Laterality: Left;  US  01:10 AP% 18.3 CDE 12.91 Fluid pak lot # [id number]   CATARACT EXTRACTION W/PHACO Right 07/28/2016   Procedure: CATARACT EXTRACTION PHACO AND INTRAOCULAR LENS PLACEMENT (IOC);  Surgeon: Clair Crews, MD;  Location: [facility];  Service: Ophthalmology;  Laterality: Right;  US  01:26 AP% 18.5 CDE 16.12 Fluid pack lot # [id number]   COLONOSCOPY  07/2016   Dr. Aldean Hummingbird   COLONOSCOPY WITH PROPOFOL  N/A 04/01/2023   Procedure: COLONOSCOPY WITH PROPOFOL ;  Surgeon: Selena Daily, MD;  Location: [facility];  Service: Gastroenterology;  Laterality: N/A;   DILATION AND CURETTAGE OF UTERUS     miscarragesx2   ESOPHAGOGASTRODUODENOSCOPY     Polyps   HEMORRHOID SURGERY  2006   KNEE ARTHROSCOPY WITH LATERAL MENISECTOMY Left 12/30/2012   Procedure: KNEE ARTHROSCOPY WITH LATERAL MENISECTOMY;  Surgeon: Neville Barbone, MD;  Location: [facility];  Service: Orthopedics;  Laterality: Left;  LEFT KNEE SCOPE LATERAL MENISCECTOMY   LEFT HEART CATH AND CORONARY ANGIOGRAPHY N/A 03/15/2017   Procedure: Left Heart Cath and Coronary Angiography;  Surgeon: Wenona Hamilton, MD;  Location: [facility];  Service: Cardiovascular;  Laterality: N/A;   MOUTH SURGERY     skin cancer eyelid  2012   TONSILLECTOMY     Social History   Social History Narrative   Wellsite geologist.     Immunization History  Administered Date(s) Administered   Fluad Quad(high Dose 65+) 10/16/2020, 07/24/2022   Influenza Split 07/24/2011, 08/23/2014   Influenza Whole 08/23/2004, 07/24/2009, 08/06/2010, 08/05/2012   Influenza, High Dose Seasonal PF 08/11/2016, 08/27/2017, 08/02/2018, 09/05/2021, 08/31/2023   Influenza,inj,Quad PF,6+ Mos 08/16/2013, 08/07/2015   Influenza-Unspecified 09/05/2021   PFIZER(Purple Top)SARS-COV-2 Vaccination 12/15/2019, 01/05/2020, 10/25/2020   Pneumococcal Conjugate-13 08/24/2014   Pneumococcal Polysaccharide-23 08/20/2008   Respiratory Syncytial Virus Vaccine,Recomb Aduvanted(Arexvy) 12/30/2022   Td 03/20/2003, 02/28/2013, 04/21/2023   Tdap 03/15/2024   Zoster, Live 09/04/2015     Objective: Vital Signs: There were no vitals taken for this visit.   Physical Exam   Musculoskeletal Exam: ***  CDAI Exam: CDAI Score: -- Patient Global: --; Provider Global: -- Swollen: --; Tender: -- Joint Exam 04/18/2024   No joint exam has been documented for this visit   There is currently no information documented on the homunculus. Go to the Rheumatology activity and complete the homunculus joint exam.  Investigation: No additional findings.  Imaging: DG Tibia/Fibula Left Result Date: 03/15/2024 CLINICAL DATA:  Left lower leg pain after fall. EXAM: LEFT TIBIA AND FIBULA - 2 VIEW COMPARISON:  None Available. FINDINGS: There is no evidence of fracture or other focal bone lesions. The cortical margins of the tibia and fibula are intact. No knee or ankle dislocation. Mild soft tissue edema. IMPRESSION: Mild soft tissue edema. No fracture of the left lower leg. Electronically Signed   By: Chadwick Colonel M.D.   On: 03/15/2024 13:38    Recent  Labs: Lab Results  Component  Value Date   WBC 11.5 (H) 01/11/2024   HGB 11.3 (L) 01/11/2024   PLT 348 01/11/2024   NA 137 01/11/2024   K 5.1 01/11/2024   CL 104 01/11/2024   CO2 24 01/11/2024   GLUCOSE 77 01/11/2024   BUN 22 01/11/2024   CREATININE 0.88 01/11/2024   BILITOT 0.5 01/11/2024   ALKPHOS 52 09/09/2023   AST 17 01/11/2024   ALT 14 01/11/2024   PROT 5.8 (L) 01/11/2024   ALBUMIN 3.5 09/09/2023   CALCIUM  9.9 01/11/2024   GFRAA 71 02/02/2020    Speciality Comments: No specialty comments available.  Procedures:  No procedures performed Allergies: Antihistamines, chlorpheniramine-type; Atenolol; Cefuroxime  axetil; Ciprofloxacin; Co q 10 [coenzyme q10]; Crestor  [rosuvastatin  calcium ]; Dextromethorphan-guaifenesin; Elavil  [amitriptyline ]; Epinephrine ; Methenamine ; Ramipril; Sulfamethoxazole-trimethoprim ; and Vitamin d  analogs   Assessment / Plan:     Visit Diagnoses: No diagnosis found.  ***  Orders: No orders of the defined types were placed in this encounter.  No orders of the defined types were placed in this encounter.    Follow-Up Instructions: No follow-ups on file.   Glena Landau, RT  Note - This record has been created using [employer].  Chart creation errors have been sought, but may not always  have been located. Such creation errors do not reflect on  the standard of medical care.

## 2024-04-05 DIAGNOSIS — S80212A Abrasion, left knee, initial encounter: Secondary | ICD-10-CM | POA: Diagnosis not present

## 2024-04-18 ENCOUNTER — Ambulatory Visit: Payer: Self-pay

## 2024-04-18 ENCOUNTER — Ambulatory Visit: Payer: Medicare Other | Admitting: Internal Medicine

## 2024-04-18 DIAGNOSIS — M059 Rheumatoid arthritis with rheumatoid factor, unspecified: Secondary | ICD-10-CM

## 2024-04-18 DIAGNOSIS — R300 Dysuria: Secondary | ICD-10-CM

## 2024-04-18 DIAGNOSIS — Z79899 Other long term (current) drug therapy: Secondary | ICD-10-CM

## 2024-04-18 NOTE — Telephone Encounter (Addendum)
 04/18/24 2:09- 3rd and final attempt by this RN. No answer. LVMTCB [phone]. Will route to clinic for follow-up  5/27 1:36p-2nd attempt by this RN, no answer, LVMTCB [phone]  This RN made first attempt to connect with patient. No answer, LVM. Routing for additional attempts.   Copied from CRM [id number]. Topic: Clinical - Red Word Triage >> Apr 18, 2024 11:30 AM Tammy Manning wrote: Red Word that prompted transfer to Nurse Triage: Patient called to report ongoing feet swelling, which has been present for quite some time. She also mentioned feeling weak and noted cloudy urine, which has been occurring off and on over the weekend.

## 2024-04-18 NOTE — Telephone Encounter (Signed)
 This RN made first attempt to connect with patient. No answer, LVM. Routing for additional attempts.   Copied from CRM [id number]. Topic: Clinical - Red Word Triage >> Apr 18, 2024 11:30 AM Caliyah H wrote: Red Word that prompted transfer to Nurse Triage: Patient called to report ongoing feet swelling, which has been present for quite some time. She also mentioned feeling weak and noted cloudy urine, which has been occurring off and on over the weekend.

## 2024-04-18 NOTE — Telephone Encounter (Signed)
 Chief Complaint: bilateral feet swelling Symptoms: mild pedal swelling Frequency: x 2.5 weeks Pertinent Negatives: Patient denies fever, CP, SOB, redness, sx of infection Disposition: [] ED /[] Urgent Care (no appt availability in office) / [x] Appointment(In office/virtual)/ []  [facility]/ [] Home Care/ [] Refused Recommended Disposition /[] [facility]/ []  Follow-up with PCP Additional Notes: Pt c/o bilateral feet swelling x 2.5 weeks. Pt reports falling 3 weeks ago, and initially thought swelling was r/t fall, but then both feet became swollen. Pt denies any similar sx like this before, as well as any CP, SOB, redness, sx of infection. Pt denies taking any water pills and did not endorse CHF, although noted in chart. Scheduled patient per protocol on 04/19/2024 with alternate provider. Patient verbalized understanding and to call back with worsening symptoms.     Copied from CRM [id number]. Topic: Clinical - Red Word Triage >> Apr 18, 2024  4:46 PM Baldo Levan wrote: Red Word that prompted transfer to Nurse Triage: Patient is calling again as she missed a call back from the nurse regarding previous CRM of "Ongoing feet swelling, which has been present for quite some time. She also mentioned feeling weak and noted cloudy urine, which has been occurring off and on over the weekend." Reason for Disposition  [1] MILD swelling of both ankles (i.e., pedal edema) AND [2] new-onset or worsening  Answer Assessment - Initial Assessment Questions 1. ONSET: "When did the swelling start?" (e.g., minutes, hours, days)     2.5 weeks ago L leg injury s/p fall 3 weeks ago-- initially thought L foot swelling r/t fall, but R foot swelling as well Reports saw cards 2.5 weeks ago 2. LOCATION: "What part of the leg is swollen?"  "Are both legs swollen or just one leg?"     feet swelling 3. SEVERITY: "How bad is the swelling?" (e.g., localized; mild, moderate, severe)   - Localized: Small area of  swelling localized to one leg.   - MILD pedal edema: Swelling limited to foot and ankle, pitting edema < 1/4 inch (6 mm) deep, rest and elevation eliminate most or all swelling.   - MODERATE edema: Swelling of lower leg to knee, pitting edema > 1/4 inch (6 mm) deep, rest and elevation only partially reduce swelling.   - SEVERE edema: Swelling extends above knee, facial or hand swelling present.      Mild pedal and above ankles, stops before calf "about 2.5-3 in above ankle" 4. REDNESS: "Does the swelling look red or infected?"     denies 5. PAIN: "Is the swelling painful to touch?" If Yes, ask: "How painful is it?"   (Scale 1-10; mild, moderate or severe)     Denies Endorses "tight" 6. FEVER: "Do you have a fever?" If Yes, ask: "What is it, how was it measured, and when did it start?"      denies 7. CAUSE: "What do you think is causing the leg swelling?"     unknown 8. MEDICAL HISTORY: "Do you have a history of blood clots (e.g., DVT), cancer, heart failure, kidney disease, or liver failure?"     CHF per chart Pt reports A fib, and cloudy urine/intermittent flank pain x 3 weeks ago Denies blood clots, liver disease 9. RECURRENT SYMPTOM: "Have you had leg swelling before?" If Yes, ask: "When was the last time?" "What happened that time?"     First time 10. OTHER SYMPTOMS: "Do you have any other symptoms?" (e.g., chest pain, difficulty breathing)  denies 11. PREGNANCY: "Is there any chance you are pregnant?" "When was your last menstrual period?"       N.a  Protocols used: Leg Swelling and Edema-A-AH

## 2024-04-18 NOTE — Telephone Encounter (Signed)
 FYI to PCP, triage nurse tried to call her 3x with no response/call back

## 2024-04-19 ENCOUNTER — Ambulatory Visit: Payer: Self-pay | Admitting: General Practice

## 2024-04-19 ENCOUNTER — Encounter: Payer: Self-pay | Admitting: General Practice

## 2024-04-19 ENCOUNTER — Ambulatory Visit (INDEPENDENT_AMBULATORY_CARE_PROVIDER_SITE_OTHER): Admitting: General Practice

## 2024-04-19 ENCOUNTER — Other Ambulatory Visit: Payer: Self-pay | Admitting: General Practice

## 2024-04-19 VITALS — BP 122/60 | HR 89 | Temp 98.1°F | Ht 64.0 in | Wt 168.0 lb

## 2024-04-19 DIAGNOSIS — I5032 Chronic diastolic (congestive) heart failure: Secondary | ICD-10-CM

## 2024-04-19 DIAGNOSIS — M25472 Effusion, left ankle: Secondary | ICD-10-CM | POA: Insufficient documentation

## 2024-04-19 DIAGNOSIS — M25471 Effusion, right ankle: Secondary | ICD-10-CM | POA: Diagnosis not present

## 2024-04-19 DIAGNOSIS — R300 Dysuria: Secondary | ICD-10-CM

## 2024-04-19 DIAGNOSIS — E875 Hyperkalemia: Secondary | ICD-10-CM

## 2024-04-19 LAB — COMPREHENSIVE METABOLIC PANEL WITH GFR
ALT: 13 U/L (ref 0–35)
AST: 13 U/L (ref 0–37)
Albumin: 3.9 g/dL (ref 3.5–5.2)
Alkaline Phosphatase: 37 U/L — ABNORMAL LOW (ref 39–117)
BUN: 26 mg/dL — ABNORMAL HIGH (ref 6–23)
CO2: 26 meq/L (ref 19–32)
Calcium: 10 mg/dL (ref 8.4–10.5)
Chloride: 105 meq/L (ref 96–112)
Creatinine, Ser: 1.1 mg/dL (ref 0.40–1.20)
GFR: 45.32 mL/min — ABNORMAL LOW (ref >60.00)
Glucose, Bld: 101 mg/dL — ABNORMAL HIGH (ref 70–99)
Potassium: 5.9 meq/L — ABNORMAL HIGH (ref 3.5–5.1)
Sodium: 139 meq/L (ref 135–145)
Total Bilirubin: 0.5 mg/dL (ref 0.2–1.2)
Total Protein: 6.1 g/dL (ref 6.0–8.3)

## 2024-04-19 LAB — POC URINALSYSI DIPSTICK (AUTOMATED)
Bilirubin, UA: NEGATIVE
Blood, UA: NEGATIVE
Glucose, UA: NEGATIVE
Ketones, UA: NEGATIVE
Leukocytes, UA: NEGATIVE
Nitrite, UA: POSITIVE
Protein, UA: POSITIVE — AB
Spec Grav, UA: 1.01 (ref 1.010–1.025)
Urobilinogen, UA: 0.2 U/dL
pH, UA: 6 (ref 5.0–8.0)

## 2024-04-19 LAB — CBC
HCT: 33.1 % — ABNORMAL LOW (ref 36.0–46.0)
Hemoglobin: 11.2 g/dL — ABNORMAL LOW (ref 12.0–15.0)
MCHC: 33.9 g/dL (ref 30.0–36.0)
MCV: 102.7 fl — ABNORMAL HIGH (ref 78.0–100.0)
Platelets: 370 10*3/uL (ref 150.0–400.0)
RBC: 3.22 Mil/uL — ABNORMAL LOW (ref 3.87–5.11)
RDW: 15.9 % — ABNORMAL HIGH (ref 11.5–15.5)
WBC: 8.3 10*3/uL (ref 4.0–10.5)

## 2024-04-19 LAB — BRAIN NATRIURETIC PEPTIDE: Pro B Natriuretic peptide (BNP): 77 pg/mL (ref 0.0–100.0)

## 2024-04-19 NOTE — Telephone Encounter (Signed)
 Noted

## 2024-04-19 NOTE — Assessment & Plan Note (Signed)
 Stable for outpatient treatment.  Exam stable.  2+ pitting edema present on exam. Faint pedal pulses.   No recent BNP.  Reviewed cardiology notes from 03/30/24. Intolerance to diuretics.  Discussed ER precautions.  Advised patient to elevate legs while sitting and wearing compression stockings.   CBC, BNP and CMP STAT.  Will message cardiologist to discuss options.

## 2024-04-19 NOTE — Telephone Encounter (Signed)
 She was seen today , thanks

## 2024-04-19 NOTE — Assessment & Plan Note (Signed)
 Followed by cardiology.  Reviewed echo cardiogram- Left ventricular ejection fraction, by estimation, is 60 to 65%- done in November, 2024.  Reviewed cardiology notes from 03/30/24.  CMP, BNP, CBC pending.

## 2024-04-19 NOTE — Progress Notes (Signed)
 Established Patient Office Visit  Subjective   Patient ID: Tammy Manning, female    DOB: March 18, 1935  Age: 89 y.o. MRN: [id number]  Chief Complaint  Patient presents with   Edema    In both ankles but left is worst. Patient states swelling has been going on about 3.5 weeks.     HPI  Tammy Manning is a 89 year old female, patient of Dr. Malissa Se, with past medical history of HTN, A fib, CHF, PAF, GERD, IBS, HLD presents today for an acute visit.   Bilateral ankle edema: symptom onset was 3.5 weeks ago. Left ankle is worse. Initial episode. Worse during the day.  Elevating her ankles does help. No pain, itching, erythema, shortness of breath, difficulty breathing or chest pain.  No new medication. Has tried chlorthalidone  in the past which caused heart palpitations and extreme fatigue. For her swelling, she tried her husband furosemide 20 mg, which she did fine with one day and the following day, she reports tachycardia. She was evaluated by the cardiologist on 03/30/24 but forgot to mention the swelling at the time.   Dysuria- not sure when her symptoms started but at least 1-2 weeks ago. She has noticed pain and burning with urination. She reports her urine is cloudy with no odor or visible blood. She denies any fever, chills, nausea or vomiting. She has a history of recurrent UTI and is currently managed on Trimethoprim  100 mg once daily. She has been on this medication for about 3-4 months. She denies any lower abdominal pain or flank pain.  Patient Active Problem List   Diagnosis Date Noted   Ankle edema, bilateral 04/19/2024   Neck pain 03/02/2024   Pain of left scapula 02/23/2024   Rhomboid muscle pain 02/23/2024   Diabetes mellitus screening 01/27/2024   Thrush 01/27/2024   Anxious mood 09/20/2023   Tremor of both hands 09/20/2023   Head injury 04/28/2023   Superficial injury of skin 04/21/2023   Dysuria 04/12/2023   Diverticulosis 03/21/2023   Chronic diastolic heart  failure (HCC) 40/98/1191   Obesity (BMI 30-39.9) 03/13/2023   PAF (paroxysmal atrial fibrillation) (HCC) 03/13/2023   Recurrent UTI (urinary tract infection) 02/18/2023   Intertrigo 02/16/2023   Hemorrhoids 02/02/2023   Urinary incontinence 02/02/2023   Dilated aortic root (HCC) 01/26/2023   Osteopenia 01/26/2023   Multinodular goiter 02/27/2022   Encounter for screening mammogram for breast cancer 01/23/2022   High risk medication use 10/07/2021   Atrial fibrillation with RVR (HCC) 04/28/2021   Hypertrophic toenail 04/22/2021   Seropositive rheumatoid arthritis (HCC) 11/25/2020   Bilateral hand pain 11/25/2020   Bilateral shoulder pain 10/16/2020   Joint pain 10/16/2020   Estrogen deficiency 04/30/2020   Dry mouth 03/15/2020   Osteoarthritis of right hip 01/02/2020   Right thyroid  nodule 05/12/2018   History of TIA (transient ischemic attack) 05/09/2018   Aortic insufficiency 09/10/2015   Stress reaction 09/08/2015   Encounter for Medicare annual wellness exam 02/28/2013   Bucket-handle tear of lateral meniscus of left knee as current injury 12/30/2012   Episodic atrial fibrillation (HCC) 11/17/2012   Hearing loss of both ears 01/14/2012   B12 deficiency 03/17/2010   GERD (gastroesophageal reflux disease) 03/17/2010   POSTMENOPAUSAL STATUS 08/20/2008   Essential hypertension 07/19/2007   Hyperlipidemia 06/24/2007   IBS 06/24/2007   DERMATITIS, ATOPIC 06/24/2007   SKIN CANCER, HX OF 06/24/2007   Past Medical History:  Diagnosis Date   A-fib (HCC)    Anginal pain (HCC)  Aortic insufficiency    a. 02/2020 Echo: EF 60-65%, no rwma, Gr1 DD, nl RV fxn, mildly dil LA, triv AI, Asc Ao 39mm; b. 04/2021 Echo: EF 60-65%, no rwma, GrI DD, nl RV fxn, mildly dil LA, AI not visualized. Mild-mod AoV sclerosis w/o stenosis.   Arthritis    Knees   B12 deficiency    Mild   Bucket-handle tear of lateral meniscus of left knee as current injury 12/30/2012   Cancer (HCC)    Skin, basal  cell on nose and eyelid   Colitis    COVID    DJD (degenerative joint disease)    Low back   Dyspepsia    GERD (gastroesophageal reflux disease)    Heart murmur    Heart palpitations    History of cardiac cath    a. 02/2017 Cath: Nl cors. Nl LV fxn. Abd Aogram w/o RAS.   HOH (hard of hearing)    Hyperlipidemia    Hypertension    IBS (irritable bowel syndrome)    Lactose intolerance    Mixed incontinence    PONV (postoperative nausea and vomiting)    Urinary tract infection    Vulvar irritation    from urine pad, uses Triamcinolone  oint.   Past Surgical History:  Procedure Laterality Date   ABDOMINAL AORTOGRAM N/A 03/15/2017   Procedure: Abdominal Aortogram;  Surgeon: Wenona Hamilton, MD;  Location: [facility];  Service: Cardiovascular;  Laterality: N/A;   ABDOMINAL HYSTERECTOMY  1982   Large fibroids and bleeding   APPENDECTOMY     BILATERAL SALPINGOOPHORECTOMY  1986   BONE GRAFT HIP ILIAC CREST     To Left arm   BREAST REDUCTION SURGERY     BREAST SURGERY     CARDIAC CATHETERIZATION     CATARACT EXTRACTION W/PHACO Left 07/07/2016   Procedure: CATARACT EXTRACTION PHACO AND INTRAOCULAR LENS PLACEMENT (IOC);  Surgeon: Clair Crews, MD;  Location: [facility];  Service: Ophthalmology;  Laterality: Left;  US  01:10 AP% 18.3 CDE 12.91 Fluid pak lot # [id number]   CATARACT EXTRACTION W/PHACO Right 07/28/2016   Procedure: CATARACT EXTRACTION PHACO AND INTRAOCULAR LENS PLACEMENT (IOC);  Surgeon: Clair Crews, MD;  Location: [facility];  Service: Ophthalmology;  Laterality: Right;  US  01:26 AP% 18.5 CDE 16.12 Fluid pack lot # [id number]   COLONOSCOPY  07/2016   Dr. Aldean Hummingbird   COLONOSCOPY WITH PROPOFOL  N/A 04/01/2023   Procedure: COLONOSCOPY WITH PROPOFOL ;  Surgeon: Selena Daily, MD;  Location: [facility];  Service: Gastroenterology;  Laterality: N/A;   DILATION AND CURETTAGE OF UTERUS     miscarragesx2   ESOPHAGOGASTRODUODENOSCOPY     Polyps   HEMORRHOID SURGERY   2006   KNEE ARTHROSCOPY WITH LATERAL MENISECTOMY Left 12/30/2012   Procedure: KNEE ARTHROSCOPY WITH LATERAL MENISECTOMY;  Surgeon: Neville Barbone, MD;  Location: [facility];  Service: Orthopedics;  Laterality: Left;  LEFT KNEE SCOPE LATERAL MENISCECTOMY   LEFT HEART CATH AND CORONARY ANGIOGRAPHY N/A 03/15/2017   Procedure: Left Heart Cath and Coronary Angiography;  Surgeon: Wenona Hamilton, MD;  Location: [facility];  Service: Cardiovascular;  Laterality: N/A;   MOUTH SURGERY     skin cancer eyelid  2012   TONSILLECTOMY     Allergies  Allergen Reactions   Antihistamines, Chlorpheniramine-Type Other (See Comments)    Reaction:  Makes pt hyper    Atenolol Hypertension   Cefuroxime  Axetil Other (See Comments)    Upset stomach   Ciprofloxacin Other (See  Comments)    Upset stomach   Co Q 10 [Coenzyme Q10] Other (See Comments)    Reaction:  GI upset    Crestor  [Rosuvastatin  Calcium ] Other (See Comments)    Reaction:  Myalgias    Dextromethorphan-Guaifenesin Other (See Comments)    Reaction:  Made pt jittery    Elavil  [Amitriptyline ]     Felt worse /tearful    Epinephrine  Hives   Methenamine      Tongue discomfort    Ramipril Other (See Comments)    Upset stomach   Sulfamethoxazole-Trimethoprim  Other (See Comments)    Upset stomach   Vitamin D  Analogs Other (See Comments)    Reaction:  GI upset          04/19/2024    8:17 AM 01/27/2024   10:56 AM 12/28/2023    1:59 PM  Depression screen PHQ 2/9  Decreased Interest 1 0 0  Down, Depressed, Hopeless 1 1 0  PHQ - 2 Score 2 1 0  Altered sleeping 1 0   Tired, decreased energy 0 1   Change in appetite 0 0   Feeling bad or failure about yourself  0 0   Trouble concentrating 0 0   Moving slowly or fidgety/restless 0 0   Suicidal thoughts 0 0   PHQ-9 Score 3 2   Difficult doing work/chores Somewhat difficult Not difficult at all Not difficult at all       04/19/2024    8:18 AM 01/27/2024   10:58 AM 11/26/2023     2:08 PM 07/21/2023    3:52 PM  GAD 7 : Generalized Anxiety Score  Nervous, Anxious, on Edge 0 0 0 0  Control/stop worrying 0 0 0 0  Worry too much - different things 0 0 0 0  Trouble relaxing 0 0 0 0  Restless 0 0 0 0  Easily annoyed or irritable 0 0 0 0  Afraid - awful might happen 0 0 0 0  Total GAD 7 Score 0 0 0 0  Anxiety Difficulty Not difficult at all Not difficult at all Not difficult at all Not difficult at all      Review of Systems  Constitutional:  Negative for chills and fever.  Respiratory:  Negative for shortness of breath.   Cardiovascular:  Positive for leg swelling. Negative for chest pain.  Gastrointestinal:  Negative for abdominal pain, constipation, diarrhea, heartburn, nausea and vomiting.  Genitourinary:  Positive for dysuria, flank pain, frequency and urgency. Negative for hematuria.  Neurological:  Negative for dizziness and headaches.  Endo/Heme/Allergies:  Negative for polydipsia.  Psychiatric/Behavioral:  Negative for depression and suicidal ideas. The patient is not nervous/anxious.       Objective:     BP 122/60 (BP Location: Left Arm, Patient Position: Sitting, Cuff Size: Normal)   Pulse 89   Temp 98.1 F (36.7 C) (Oral)   Ht 5\' 4"  (1.626 m)   Wt 168 lb (76.2 kg)   SpO2 97%   BMI 28.84 kg/m  BP Readings from Last 3 Encounters:  04/19/24 122/60  03/30/24 115/61  03/15/24 (!) 140/69   Wt Readings from Last 3 Encounters:  04/19/24 168 lb (76.2 kg)  03/30/24 168 lb 6.4 oz (76.4 kg)  02/23/24 171 lb (77.6 kg)      Physical Exam Vitals and nursing note reviewed.  Constitutional:      Appearance: Normal appearance.  Cardiovascular:     Rate and Rhythm: Normal rate and regular rhythm.     Pulses:  Dorsalis pedis pulses are 1+ on the right side and 1+ on the left side.       Posterior tibial pulses are 1+ on the right side and 1+ on the left side.     Heart sounds: Normal heart sounds.  Pulmonary:     Effort: Pulmonary  effort is normal.     Breath sounds: Normal breath sounds.  Abdominal:     Tenderness: There is no right CVA tenderness or left CVA tenderness.  Musculoskeletal:        General: No tenderness.     Right lower leg: Edema present.     Left lower leg: Edema present.  Neurological:     Mental Status: She is alert and oriented to person, place, and time.  Psychiatric:        Mood and Affect: Mood normal.        Behavior: Behavior normal.        Thought Content: Thought content normal.        Judgment: Judgment normal.      Results for orders placed or performed in visit on 04/19/24  POCT Urinalysis Dipstick (Automated)  Result Value Ref Range   Color, UA yellow    Clarity, UA very cloudy    Glucose, UA Negative Negative   Bilirubin, UA neg    Ketones, UA neg    Spec Grav, UA 1.010 1.010 - 1.025   Blood, UA neg    pH, UA 6.0 5.0 - 8.0   Protein, UA Positive (A) Negative   Urobilinogen, UA 0.2 0.2 or 1.0 E.U./dL   Nitrite, UA positive    Leukocytes, UA Negative Negative       The ASCVD Risk score (Arnett DK, et al., 2019) failed to calculate for the following reasons:   The 2019 ASCVD risk score is only valid for ages 61 to 42    Assessment & Plan:  Ankle edema, bilateral Assessment & Plan: Stable for outpatient treatment.  Exam stable.  2+ pitting edema present on exam. Faint pedal pulses.   No recent BNP.  Reviewed cardiology notes from 03/30/24. Intolerance to diuretics.  Discussed ER precautions.  Advised patient to elevate legs while sitting and wearing compression stockings.   CBC, BNP and CMP STAT.  Will message cardiologist to discuss options.   Orders: -     Brain natriuretic peptide -     Comprehensive metabolic panel with GFR -     CBC  Dysuria Assessment & Plan: POC UA - Urine dipstick shows negative for all components, positive for nitrites, protein.  Hx of recurrent UTI.  Urine culture pending.  Await results.  Orders: -     POCT Urinalysis  Dipstick (Automated) -     Urine Culture  Chronic diastolic heart failure (HCC) Assessment & Plan: Followed by cardiology.  Reviewed echo cardiogram- Left ventricular ejection fraction, by estimation, is 60 to 65%- done in November, 2024.  Reviewed cardiology notes from 03/30/24.  CMP, BNP, CBC pending.  Orders: -     Brain natriuretic peptide     Return if symptoms worsen or fail to improve.    Jolanda Nation, NP

## 2024-04-19 NOTE — Assessment & Plan Note (Signed)
 POC UA - Urine dipstick shows negative for all components, positive for nitrites, protein.  Hx of recurrent UTI.  Urine culture pending.  Await results.

## 2024-04-19 NOTE — Patient Instructions (Signed)
 Stop by the lab prior to leaving today. I will notify you of your results once received.   Please keep your legs elevated when sitting.  Start wearing compression stockings daily.  I will be in touch once I get the lab results back.   As discussed, please go to the ER if you develop shortness of breath, difficulty breathing or chest pain.   It was a pleasure meeting you!

## 2024-04-20 ENCOUNTER — Other Ambulatory Visit (INDEPENDENT_AMBULATORY_CARE_PROVIDER_SITE_OTHER)

## 2024-04-20 DIAGNOSIS — E875 Hyperkalemia: Secondary | ICD-10-CM | POA: Diagnosis not present

## 2024-04-20 LAB — URINE CULTURE
MICRO NUMBER:: 16508696
SPECIMEN QUALITY:: ADEQUATE

## 2024-04-21 ENCOUNTER — Ambulatory Visit: Payer: Self-pay | Admitting: Nurse Practitioner

## 2024-04-21 LAB — BASIC METABOLIC PANEL WITH GFR
BUN: 30 mg/dL — ABNORMAL HIGH (ref 6–23)
CO2: 24 meq/L (ref 19–32)
Calcium: 9.8 mg/dL (ref 8.4–10.5)
Chloride: 104 meq/L (ref 96–112)
Creatinine, Ser: 1.28 mg/dL — ABNORMAL HIGH (ref 0.40–1.20)
GFR: 37.78 mL/min — ABNORMAL LOW (ref >60.00)
Glucose, Bld: 144 mg/dL — ABNORMAL HIGH (ref 70–99)
Potassium: 5.8 meq/L — ABNORMAL HIGH (ref 3.5–5.1)
Sodium: 136 meq/L (ref 135–145)

## 2024-04-24 ENCOUNTER — Ambulatory Visit: Payer: Self-pay | Admitting: *Deleted

## 2024-04-24 NOTE — Telephone Encounter (Signed)
  Chief Complaint: cloudy urine, feet swelling Symptoms: cloudy urine, back pain, pain with urination Frequency: last week Pertinent Negatives: Patient denies fever,blood in urine Disposition: [] ED /[] Urgent Care (no appt availability in office) / [x] Appointment(In office/virtual)/ []  [place] [facility]/ [] Home Care/ [] Refused Recommended Disposition /[] [place] [facility]/ []  Follow-up with PCP Additional Notes: Patient was triaged last week and advised ED- patient is extremely frusrated with triage- patient only wants to see her PCP- appointment has been scheduled.    Copied from CRM [id number]. Topic: Clinical - Red Word Triage >> Apr 24, 2024  8:26 AM Tammy Manning wrote: Red Word that prompted transfer to Nurse Triage: Bladder infection possibly, and swollen feet. Reason for Disposition  [1] MILD swelling of both ankles (i.e., pedal edema) AND [2] new-onset or worsening  Side (flank) or lower back pain present  Answer Assessment - Initial Assessment Questions 1. SYMPTOM: "What's the main symptom you're concerned about?" (e.g., frequency, incontinence)     Cloudy urine- patient is taking antibiotic- prophylactic- trimethoprim  (TRIMPEX ) 100 MG tablet 2. ONSET: "When did the  symptoms  start?"     Last week 3. PAIN: "Is there any pain?" If Yes, ask: "How bad is it?" (Scale: 1-10; mild, moderate, severe)     no 4. CAUSE: "What do you think is causing the symptoms?"     UTI 5. OTHER SYMPTOMS: "Do you have any other symptoms?" (e.g., blood in urine, fever, flank pain, pain with urination)     Ache in lower back in am, edema feet- new  Answer Assessment - Initial Assessment Questions 1. ONSET: "When did the swelling start?" (e.g., minutes, hours, days)     Started at same time- last week 2. LOCATION: "What part of the leg is swollen?"  "Are both legs swollen or just one leg?"     Leg/feet swelling 3. SEVERITY: "How bad is the swelling?" (e.g., localized; mild, moderate, severe)   -  Localized: Small area of swelling localized to one leg.   - MILD pedal edema: Swelling limited to foot and ankle, pitting edema < 1/4 inch (6 mm) deep, rest and elevation eliminate most or all swelling.   - MODERATE edema: Swelling of lower leg to knee, pitting edema > 1/4 inch (6 mm) deep, rest and elevation only partially reduce swelling.   - SEVERE edema: Swelling extends above knee, facial or hand swelling present.      Comes and goes- swelling goes down at night  5. PAIN: "Is the swelling painful to touch?" If Yes, ask: "How painful is it?"   (Scale 1-10; mild, moderate or severe)     no 6. FEVER: "Do you have a fever?" If Yes, ask: "What is it, how was it measured, and when did it start?"      no 7. CAUSE: "What do you think is causing the leg swelling?"     unsure  9. RECURRENT SYMPTOM: "Have you had leg swelling before?" If Yes, ask: "When was the last time?" "What happened that time?"     New onset 10. OTHER SYMPTOMS: "Do you have any other symptoms?" (e.g., chest pain, difficulty breathing)       no  Protocols used: Urinary Symptoms-A-AH, Leg Swelling and Edema-A-AH

## 2024-04-24 NOTE — Telephone Encounter (Signed)
 Will see patient then Agree with ER and UC precautions

## 2024-04-24 NOTE — Telephone Encounter (Signed)
 Appt scheduled tomorrow, per triage nurse note she only wants to see PCP so they scheduled pt in 1st available spot  Fyi to PCP

## 2024-04-25 ENCOUNTER — Ambulatory Visit

## 2024-04-25 ENCOUNTER — Ambulatory Visit (INDEPENDENT_AMBULATORY_CARE_PROVIDER_SITE_OTHER): Admitting: Family Medicine

## 2024-04-25 ENCOUNTER — Encounter: Payer: Self-pay | Admitting: Family Medicine

## 2024-04-25 VITALS — BP 130/58 | HR 77 | Temp 98.4°F | Ht 64.0 in | Wt 169.5 lb

## 2024-04-25 DIAGNOSIS — E875 Hyperkalemia: Secondary | ICD-10-CM | POA: Insufficient documentation

## 2024-04-25 DIAGNOSIS — R8290 Unspecified abnormal findings in urine: Secondary | ICD-10-CM

## 2024-04-25 DIAGNOSIS — R944 Abnormal results of kidney function studies: Secondary | ICD-10-CM | POA: Diagnosis not present

## 2024-04-25 DIAGNOSIS — M25472 Effusion, left ankle: Secondary | ICD-10-CM | POA: Diagnosis not present

## 2024-04-25 DIAGNOSIS — R300 Dysuria: Secondary | ICD-10-CM

## 2024-04-25 DIAGNOSIS — M25471 Effusion, right ankle: Secondary | ICD-10-CM | POA: Diagnosis not present

## 2024-04-25 DIAGNOSIS — E538 Deficiency of other specified B group vitamins: Secondary | ICD-10-CM | POA: Diagnosis not present

## 2024-04-25 DIAGNOSIS — N390 Urinary tract infection, site not specified: Secondary | ICD-10-CM

## 2024-04-25 DIAGNOSIS — I5032 Chronic diastolic (congestive) heart failure: Secondary | ICD-10-CM

## 2024-04-25 DIAGNOSIS — I10 Essential (primary) hypertension: Secondary | ICD-10-CM | POA: Diagnosis not present

## 2024-04-25 LAB — POC URINALSYSI DIPSTICK (AUTOMATED)
Bilirubin, UA: NEGATIVE
Blood, UA: NEGATIVE
Glucose, UA: NEGATIVE
Ketones, UA: NEGATIVE
Nitrite, UA: POSITIVE — AB
Protein, UA: NEGATIVE
Spec Grav, UA: 1.015 (ref 1.010–1.025)
Urobilinogen, UA: 0.2 U/dL
pH, UA: 6 (ref 5.0–8.0)

## 2024-04-25 MED ORDER — CEPHALEXIN 500 MG PO CAPS: 500.0000 mg | ORAL_CAPSULE | Freq: Two times a day (BID) | ORAL | 0 refills | Status: DC

## 2024-04-25 MED ORDER — FUROSEMIDE 20 MG PO TABS: ORAL_TABLET | ORAL | 0 refills | Status: DC

## 2024-04-25 MED ORDER — CYANOCOBALAMIN 1000 MCG/ML IJ SOLN
1000.0000 ug | Freq: Once | INTRAMUSCULAR | Status: AC
  Administered 2024-04-25: 1000 ug via INTRAMUSCULAR

## 2024-04-25 NOTE — Assessment & Plan Note (Signed)
 Times 2 Last 5.8   Will cut ARB dose in 1/2  Lasix 20 mg daily for 2 d Right check later this week Long discussion re: cardiac risks of high K   Reviewed NP Deborra Falter note Notable that pt refused ER disposition while I was out of office

## 2024-04-25 NOTE — Assessment & Plan Note (Signed)
 B12 shot today.

## 2024-04-25 NOTE — Assessment & Plan Note (Signed)
 Urinalysis with leuk and nit  D/c trimethoprim  due to low gfr Starting keflex   Pending culture (recent one neg)

## 2024-04-25 NOTE — Assessment & Plan Note (Signed)
?   If ibersartan may be cause of hyperkalemia  Will cut from 300 to 150 mg   Re check lab later this week   BP: (!) 130/58

## 2024-04-25 NOTE — Assessment & Plan Note (Signed)
 Due to low GFR- asked to stop trimethoprim  Urinalysis positive  Last culture nec, another one sent Will prescribe keflex  while that is pending

## 2024-04-25 NOTE — Patient Instructions (Addendum)
 Stop trimethoprim  -it may be working your kidneys too hard  Opt for water more than tea for urinary health  We will get another urine culture (last one was negative)    Start generic keflex  for possible uti now  We will reach out with culture result when it returns and if the culture is negative we will have you stop it   The ibesartan may be increasing your potassium  Please cut the pill in 1/2 and just take a half pill daily   Take lasix 20 mg -one today One tomorrow  Then hold the rest  This will help swelling but also decrease potassium   Re check labs for kidney and potassium on Thursday or Friday   In the meantime if you have chest pain/ trouble breathing or other symptoms go to the ER  Keep up updated as well

## 2024-04-25 NOTE — Assessment & Plan Note (Signed)
 GFR down from 64 to 37.78 recently  Also K of 5.8  Worrisome   Instructed to hold trimethoprim   Start keflex  for possible uti (culture pending) Drink water  Cut ibersartan in 1/2 to 150 mg   Re check bmet later this week   Call back and Er precautions noted in detail today

## 2024-04-25 NOTE — Progress Notes (Signed)
 Subjective:    Patient ID: Tammy Manning, female    DOB: 03-30-1935, 89 y.o.   MRN: [id number]  HPI  Wt Readings from Last 3 Encounters:  04/25/24 169 lb 8 oz (76.9 kg)  04/19/24 168 lb (76.2 kg)  03/30/24 168 lb 6.4 oz (76.4 kg)   29.09 kg/m  Vitals:   04/25/24 1224  BP: (!) 130/58  Pulse: 77  Temp: 98.4 F (36.9 C)  SpO2: 99%     Pt presents for  Urinary symptoms  Swollen feet  Hyperkalemia  B12 injection (due)    Urinary symptoms since last week Cloudy urine (taking proph trimethoprim )  Back ache-today improved  Urine still looks cloudy No fever   Positive urinalysis today  Urine culture from 5/28 showed mixed genital flora   Results for orders placed or performed in visit on 04/25/24  POCT Urinalysis Dipstick (Automated)   Collection Time: 04/25/24 12:44 PM  Result Value Ref Range   Color, UA Yellow    Clarity, UA Cloudy    Glucose, UA Negative Negative   Bilirubin, UA Negative    Ketones, UA Negative    Spec Grav, UA 1.015 1.010 - 1.025   Blood, UA Negative    pH, UA 6.0 5.0 - 8.0   Protein, UA Negative Negative   Urobilinogen, UA 0.2 0.2 or 1.0 E.U./dL   Nitrite, UA Positive (A)    Leukocytes, UA Moderate (2+) (A) Negative   Some dysuria    Swollen feet /ankles  Was seen by NP Deborra Falter  She sees cardiology but did not mention at last visit In past chlorthalidone  caused fatigue Tried husband's furosemide 20- next day noted tachycardia   Not eating more salt  Avoiding processed foods   BNP normal at 77    Has history of chronic diastolic heart failure   Potassium was elevated At first 5.9 Then 5.8  Lab Results  Component Value Date   NA 136 04/20/2024   K 5.8 No hemolysis seen (H) 04/20/2024   CO2 24 04/20/2024   GLUCOSE 144 (H) 04/20/2024   BUN 30 (H) 04/20/2024   CREATININE 1.28 (H) 04/20/2024   CALCIUM  9.8 04/20/2024   GFR 37.78 (L) 04/20/2024   EGFR 64 01/11/2024   GFRNONAA 60 (L) 09/09/2023   GFR down to 37.78  Pt  takes ibesartan 300 mg daily for HTN    Lab Results  Component Value Date   WBC 8.3 04/19/2024   HGB 11.2 (L) 04/19/2024   HCT 33.1 (L) 04/19/2024   MCV 102.7 (H) 04/19/2024   PLT 370.0 04/19/2024   Urinalysis today Results for orders placed or performed in visit on 04/25/24  POCT Urinalysis Dipstick (Automated)   Collection Time: 04/25/24 12:44 PM  Result Value Ref Range   Color, UA Yellow    Clarity, UA Cloudy    Glucose, UA Negative Negative   Bilirubin, UA Negative    Ketones, UA Negative    Spec Grav, UA 1.015 1.010 - 1.025   Blood, UA Negative    pH, UA 6.0 5.0 - 8.0   Protein, UA Negative Negative   Urobilinogen, UA 0.2 0.2 or 1.0 E.U./dL   Nitrite, UA Positive (A)    Leukocytes, UA Moderate (2+) (A) Negative       Patient Active Problem List   Diagnosis Date Noted   Hyperkalemia 04/25/2024   Decreased GFR 04/25/2024   Ankle edema, bilateral 04/19/2024   Neck pain 03/02/2024   Pain of left scapula 02/23/2024  Rhomboid muscle pain 02/23/2024   Diabetes mellitus screening 01/27/2024   Thrush 01/27/2024   Anxious mood 09/20/2023   Tremor of both hands 09/20/2023   Head injury 04/28/2023   Superficial injury of skin 04/21/2023   Dysuria 04/12/2023   Diverticulosis 03/21/2023   Chronic diastolic heart failure (HCC) 03/13/2023   Obesity (BMI 30-39.9) 03/13/2023   PAF (paroxysmal atrial fibrillation) (HCC) 03/13/2023   Recurrent UTI (urinary tract infection) 02/18/2023   Intertrigo 02/16/2023   Hemorrhoids 02/02/2023   Urinary incontinence 02/02/2023   Dilated aortic root (HCC) 01/26/2023   Osteopenia 01/26/2023   Multinodular goiter 02/27/2022   Encounter for screening mammogram for breast cancer 01/23/2022   High risk medication use 10/07/2021   Atrial fibrillation with RVR (HCC) 04/28/2021   Hypertrophic toenail 04/22/2021   Seropositive rheumatoid arthritis (HCC) 11/25/2020   Bilateral hand pain 11/25/2020   Bilateral shoulder pain 10/16/2020    Joint pain 10/16/2020   Estrogen deficiency 04/30/2020   Dry mouth 03/15/2020   Osteoarthritis of right hip 01/02/2020   Right thyroid  nodule 05/12/2018   History of TIA (transient ischemic attack) 05/09/2018   Aortic insufficiency 09/10/2015   Stress reaction 09/08/2015   Encounter for Medicare annual wellness exam 02/28/2013   Bucket-handle tear of lateral meniscus of left knee as current injury 12/30/2012   Episodic atrial fibrillation (HCC) 11/17/2012   Hearing loss of both ears 01/14/2012   B12 deficiency 03/17/2010   GERD (gastroesophageal reflux disease) 03/17/2010   POSTMENOPAUSAL STATUS 08/20/2008   Essential hypertension 07/19/2007   Hyperlipidemia 06/24/2007   IBS 06/24/2007   DERMATITIS, ATOPIC 06/24/2007   SKIN CANCER, HX OF 06/24/2007   Past Medical History:  Diagnosis Date   A-fib (HCC)    Anginal pain (HCC)    Aortic insufficiency    a. 02/2020 Echo: EF 60-65%, no rwma, Gr1 DD, nl RV fxn, mildly dil LA, triv AI, Asc Ao 39mm; b. 04/2021 Echo: EF 60-65%, no rwma, GrI DD, nl RV fxn, mildly dil LA, AI not visualized. Mild-mod AoV sclerosis w/o stenosis.   Arthritis    Knees   B12 deficiency    Mild   Bucket-handle tear of lateral meniscus of left knee as current injury 12/30/2012   Cancer (HCC)    Skin, basal cell on nose and eyelid   Colitis    COVID    DJD (degenerative joint disease)    Low back   Dyspepsia    GERD (gastroesophageal reflux disease)    Heart murmur    Heart palpitations    History of cardiac cath    a. 02/2017 Cath: Nl cors. Nl LV fxn. Abd Aogram w/o RAS.   HOH (hard of hearing)    Hyperlipidemia    Hypertension    IBS (irritable bowel syndrome)    Lactose intolerance    Mixed incontinence    PONV (postoperative nausea and vomiting)    Urinary tract infection    Vulvar irritation    from urine pad, uses Triamcinolone  oint.   Past Surgical History:  Procedure Laterality Date   ABDOMINAL AORTOGRAM N/A 03/15/2017   Procedure: Abdominal  Aortogram;  Surgeon: Wenona Hamilton, MD;  Location: [facility];  Service: Cardiovascular;  Laterality: N/A;   ABDOMINAL HYSTERECTOMY  1982   Large fibroids and bleeding   APPENDECTOMY     BILATERAL SALPINGOOPHORECTOMY  1986   BONE GRAFT HIP ILIAC CREST     To Left arm   BREAST REDUCTION SURGERY     BREAST  SURGERY     CARDIAC CATHETERIZATION     CATARACT EXTRACTION W/PHACO Left 07/07/2016   Procedure: CATARACT EXTRACTION PHACO AND INTRAOCULAR LENS PLACEMENT (IOC);  Surgeon: Clair Crews, MD;  Location: [facility];  Service: Ophthalmology;  Laterality: Left;  US  01:10 AP% 18.3 CDE 12.91 Fluid pak lot # [id number]   CATARACT EXTRACTION W/PHACO Right 07/28/2016   Procedure: CATARACT EXTRACTION PHACO AND INTRAOCULAR LENS PLACEMENT (IOC);  Surgeon: Clair Crews, MD;  Location: [facility];  Service: Ophthalmology;  Laterality: Right;  US  01:26 AP% 18.5 CDE 16.12 Fluid pack lot # [id number]   COLONOSCOPY  07/2016   Dr. Aldean Hummingbird   COLONOSCOPY WITH PROPOFOL  N/A 04/01/2023   Procedure: COLONOSCOPY WITH PROPOFOL ;  Surgeon: Selena Daily, MD;  Location: [facility];  Service: Gastroenterology;  Laterality: N/A;   DILATION AND CURETTAGE OF UTERUS     miscarragesx2   ESOPHAGOGASTRODUODENOSCOPY     Polyps   HEMORRHOID SURGERY  2006   KNEE ARTHROSCOPY WITH LATERAL MENISECTOMY Left 12/30/2012   Procedure: KNEE ARTHROSCOPY WITH LATERAL MENISECTOMY;  Surgeon: Neville Barbone, MD;  Location: [facility];  Service: Orthopedics;  Laterality: Left;  LEFT KNEE SCOPE LATERAL MENISCECTOMY   LEFT HEART CATH AND CORONARY ANGIOGRAPHY N/A 03/15/2017   Procedure: Left Heart Cath and Coronary Angiography;  Surgeon: Wenona Hamilton, MD;  Location: [facility];  Service: Cardiovascular;  Laterality: N/A;   MOUTH SURGERY     skin cancer eyelid  2012   TONSILLECTOMY     Social History   Tobacco Use   Smoking status: Former    Current packs/day: 0.00    Average packs/day: 1  pack/day for 12.0 years (12.0 ttl pk-yrs)    Types: Cigarettes    Start date: 11/24/1951    Quit date: 11/24/1963    Years since quitting: 60.4    Passive exposure: Never   Smokeless tobacco: Never  Vaping Use   Vaping status: Never Used  Substance Use Topics   Alcohol use: No    Alcohol/week: 0.0 standard drinks of alcohol   Drug use: No   Family History  Problem Relation Age of Onset   Cancer Mother        Colon   Heart disease Father        CAD   Diabetes Sister    Hypothyroidism Sister    Diabetes Sister    Alzheimer's disease Sister    Diabetes Brother    Leukemia Brother    Esophageal cancer Brother    Diabetes Brother    Cancer Paternal Aunt        Breast   Breast cancer Daughter    Allergies  Allergen Reactions   Antihistamines, Chlorpheniramine-Type Other (See Comments)    Reaction:  Makes pt hyper    Atenolol Hypertension   Cefuroxime  Axetil Other (See Comments)    Upset stomach   Ciprofloxacin Other (See Comments)    Upset stomach   Co Q 10 [Coenzyme Q10] Other (See Comments)    Reaction:  GI upset    Crestor  [Rosuvastatin  Calcium ] Other (See Comments)    Reaction:  Myalgias    Dextromethorphan-Guaifenesin Other (See Comments)    Reaction:  Made pt jittery    Diflucan  [Fluconazole ] Other (See Comments)    dizzy   Elavil  [Amitriptyline ]     Felt worse /tearful    Epinephrine  Hives   Methenamine      Tongue discomfort    Ramipril Other (See Comments)    Upset stomach  Sulfamethoxazole-Trimethoprim  Other (See Comments)    Upset stomach   Vitamin D  Analogs Other (See Comments)    Reaction:  GI upset    Current Outpatient Medications on File Prior to Visit  Medication Sig Dispense Refill   atorvastatin  (LIPITOR) 10 MG tablet Take 1 tablet (10 mg total) by mouth daily. 90 tablet 3   Calcium  Carbonate-Vitamin D  (CALCIUM  600+D PO) Take 1 tablet by mouth daily.      CARTIA  XT 180 MG 24 hr capsule TAKE 1 CAPSULE(180 MG) BY MOUTH DAILY 90 capsule 3    clobetasol  ointment (TEMOVATE ) 0.05 % Apply 1 application topically 2 (two) times daily as needed.     clorazepate  (TRANXENE ) 7.5 MG tablet TAKE ONE TABLET BY MOUTH ONCE DAILY AS NEEDED 30 tablet 0   cyanocobalamin  (,VITAMIN B-12,) 1000 MCG/ML injection Inject 1,000 mcg into the muscle every 30 (thirty) days.      ELIQUIS  5 MG TABS tablet TAKE 1 TABLET BY MOUTH TWICE DAILY 180 tablet 1   estradiol  (ESTRACE ) 0.1 MG/GM vaginal cream Apply a pea-sized amount to fingertip and wipe in vaginal introitus twice weekly 42.5 g 1   famotidine  (PEPCID ) 20 MG tablet Take 20 mg by mouth 2 (two) times daily as needed for heartburn or indigestion.     guaiFENesin (MUCINEX) 600 MG 12 hr tablet Take 600 mg by mouth 2 (two) times daily as needed.     hydrocortisone  (ANUSOL -HC) 2.5 % rectal cream Apply 1 Application topically at bedtime. To affected area for 10 days 30 g 0   hydrocortisone  (ANUSOL -HC) 25 MG suppository Place 1 suppository (25 mg total) rectally 2 (two) times daily. 10 suppository 0   irbesartan  (AVAPRO ) 300 MG tablet TAKE 1 TABLET BY MOUTH DAILY (Patient taking differently: Take 150 mg by mouth daily.) 90 tablet 2   loperamide  (IMODIUM ) 2 MG capsule Take 2 mg by mouth as needed for diarrhea or loose stools.     methotrexate  250 MG/10ML injection INJECT 0.6 MLS INTO THE SKIN ONCE A WEEK 4 mL 2   nystatin  (MYCOSTATIN ) 100000 UNIT/ML suspension Take 5 mLs (500,000 Units total) by mouth 3 (three) times daily. Swish and swallow 120 mL 1   triamcinolone  (NASACORT ) 55 MCG/ACT AERO nasal inhaler Place 2 sprays into the nose daily. 1 each 12   Tuberculin-Allergy  Syringes 27G X 1/2" 1 ML MISC Use 1 syringe once weekly to inject methotrexate . 25 each 1   No current facility-administered medications on file prior to visit.    Review of Systems  Constitutional:  Negative for activity change, appetite change, fatigue, fever and unexpected weight change.  HENT:  Negative for congestion, ear pain, rhinorrhea,  sinus pressure and sore throat.   Eyes:  Negative for pain, redness and visual disturbance.  Respiratory:  Negative for cough, shortness of breath and wheezing.   Cardiovascular:  Positive for leg swelling. Negative for chest pain and palpitations.  Gastrointestinal:  Negative for abdominal pain, blood in stool, constipation and diarrhea.  Endocrine: Negative for polydipsia and polyuria.  Genitourinary:  Positive for dysuria. Negative for frequency, hematuria and urgency.  Musculoskeletal:  Negative for arthralgias, back pain and myalgias.  Skin:  Negative for pallor and rash.  Allergic/Immunologic: Negative for environmental allergies.  Neurological:  Negative for dizziness, syncope and headaches.  Hematological:  Negative for adenopathy. Does not bruise/bleed easily.  Psychiatric/Behavioral:  Negative for decreased concentration and dysphoric mood. The patient is nervous/anxious.        Objective:   Physical Exam Constitutional:  General: She is not in acute distress.    Appearance: Normal appearance. She is well-developed. She is obese. She is not ill-appearing or diaphoretic.  HENT:     Head: Normocephalic and atraumatic.     Mouth/Throat:     Mouth: Mucous membranes are moist.     Pharynx: Oropharynx is clear.  Eyes:     General: No scleral icterus.       Right eye: No discharge.        Left eye: No discharge.     Conjunctiva/sclera: Conjunctivae normal.     Pupils: Pupils are equal, round, and reactive to light.  Neck:     Thyroid : No thyromegaly.     Vascular: No carotid bruit or JVD.  Cardiovascular:     Rate and Rhythm: Normal rate.     Heart sounds: Murmur heard.     No gallop.  Pulmonary:     Effort: Pulmonary effort is normal. No respiratory distress.     Breath sounds: Normal breath sounds. No stridor. No wheezing, rhonchi or rales.     Comments: No crackles  Abdominal:     General: There is no distension or abdominal bruit.     Palpations: Abdomen is  soft.     Tenderness: There is no abdominal tenderness. There is no right CVA tenderness or left CVA tenderness.     Comments: No suprapubic tenderness or fullness    Musculoskeletal:     Cervical back: Normal range of motion and neck supple.     Right lower leg: Edema present.     Left lower leg: Edema present.     Comments: One plus pitting edema bilateral pedal (slightly worse on left)   Lymphadenopathy:     Cervical: No cervical adenopathy.  Skin:    General: Skin is warm and dry.     Coloration: Skin is not pale.     Findings: Bruising present. No rash.     Comments: Old ecchymosis RLE  Neurological:     Mental Status: She is alert.     Coordination: Coordination normal.     Deep Tendon Reflexes: Reflexes are normal and symmetric. Reflexes normal.  Psychiatric:        Mood and Affect: Mood is anxious.           Assessment & Plan:   Problem List Items Addressed This Visit       Cardiovascular and Mediastinum   Essential hypertension   ? If ibersartan may be cause of hyperkalemia  Will cut from 300 to 150 mg   Re check lab later this week   BP: (!) 130/58        Relevant Medications   furosemide (LASIX) 20 MG tablet   Other Relevant Orders   Basic metabolic panel with GFR   Chronic diastolic heart failure (HCC)   This may be cause of pedal edema  Reviewed last cardiology note   Trial of lasix 20 mg daily for 2 d (may help hyperkalemia as well)         Relevant Medications   furosemide (LASIX) 20 MG tablet     Genitourinary   Recurrent UTI (urinary tract infection)   Due to low GFR- asked to stop trimethoprim  Urinalysis positive  Last culture nec, another one sent Will prescribe keflex  while that is pending       Relevant Medications   cephALEXin  (KEFLEX ) 500 MG capsule     Other   Hyperkalemia - Primary   Times  2 Last 5.8   Will cut ARB dose in 1/2  Lasix 20 mg daily for 2 d Right check later this week Long discussion re: cardiac risks  of high K   Reviewed NP Deborra Falter note Notable that pt refused ER disposition while I was out of office        Dysuria   Urinalysis with leuk and nit  D/c trimethoprim  due to low gfr Starting keflex   Pending culture (recent one neg)       Relevant Orders   Urine Culture   Decreased GFR   GFR down from 64 to 37.78 recently  Also K of 5.8  Worrisome   Instructed to hold trimethoprim   Start keflex  for possible uti (culture pending) Drink water  Cut ibersartan in 1/2 to 150 mg   Re check bmet later this week   Call back and Er precautions noted in detail today        Relevant Orders   Basic metabolic panel with GFR   B12 deficiency   B12 shot today      Ankle edema, bilateral   May be due to diastolic dysfunction  Reviewed note from NP Deborra Falter in detail  Also labs with high K and low gfr   Lasix 20 mg daily for 2 d Lab end of week  Elevate  Call back and Er precautions noted in detail today        Relevant Orders   Basic metabolic panel with GFR   Other Visit Diagnoses       Cloudy urine       Relevant Orders   POCT Urinalysis Dipstick (Automated) (Completed)

## 2024-04-25 NOTE — Assessment & Plan Note (Signed)
 May be due to diastolic dysfunction  Reviewed note from NP Deborra Falter in detail  Also labs with high K and low gfr   Lasix 20 mg daily for 2 d Lab end of week  Elevate  Call back and Er precautions noted in detail today

## 2024-04-25 NOTE — Assessment & Plan Note (Signed)
 This may be cause of pedal edema  Reviewed last cardiology note   Trial of lasix 20 mg daily for 2 d (may help hyperkalemia as well)

## 2024-04-26 ENCOUNTER — Other Ambulatory Visit: Payer: Self-pay | Admitting: Cardiovascular Disease

## 2024-04-26 LAB — URINE CULTURE
MICRO NUMBER:: 16532585
SPECIMEN QUALITY:: ADEQUATE

## 2024-04-27 ENCOUNTER — Other Ambulatory Visit (INDEPENDENT_AMBULATORY_CARE_PROVIDER_SITE_OTHER)

## 2024-04-27 ENCOUNTER — Ambulatory Visit: Payer: Self-pay | Admitting: Family Medicine

## 2024-04-27 DIAGNOSIS — I10 Essential (primary) hypertension: Secondary | ICD-10-CM

## 2024-04-27 DIAGNOSIS — R944 Abnormal results of kidney function studies: Secondary | ICD-10-CM

## 2024-04-27 DIAGNOSIS — M25471 Effusion, right ankle: Secondary | ICD-10-CM

## 2024-04-27 DIAGNOSIS — M25472 Effusion, left ankle: Secondary | ICD-10-CM | POA: Diagnosis not present

## 2024-04-28 LAB — BASIC METABOLIC PANEL WITH GFR
BUN: 25 mg/dL — ABNORMAL HIGH (ref 6–23)
CO2: 24 meq/L (ref 19–32)
Calcium: 9.8 mg/dL (ref 8.4–10.5)
Chloride: 102 meq/L (ref 96–112)
Creatinine, Ser: 1 mg/dL (ref 0.40–1.20)
GFR: 50.8 mL/min — ABNORMAL LOW (ref >60.00)
Glucose, Bld: 82 mg/dL (ref 70–99)
Potassium: 5 meq/L (ref 3.5–5.1)
Sodium: 135 meq/L (ref 135–145)

## 2024-05-10 ENCOUNTER — Encounter: Payer: Self-pay | Admitting: Family Medicine

## 2024-05-10 ENCOUNTER — Ambulatory Visit (INDEPENDENT_AMBULATORY_CARE_PROVIDER_SITE_OTHER): Admitting: Family Medicine

## 2024-05-10 VITALS — BP 128/74 | HR 79 | Temp 98.7°F | Ht 64.0 in | Wt 171.4 lb

## 2024-05-10 DIAGNOSIS — I10 Essential (primary) hypertension: Secondary | ICD-10-CM | POA: Diagnosis not present

## 2024-05-10 DIAGNOSIS — I4891 Unspecified atrial fibrillation: Secondary | ICD-10-CM | POA: Diagnosis not present

## 2024-05-10 DIAGNOSIS — E875 Hyperkalemia: Secondary | ICD-10-CM | POA: Diagnosis not present

## 2024-05-10 DIAGNOSIS — N390 Urinary tract infection, site not specified: Secondary | ICD-10-CM

## 2024-05-10 DIAGNOSIS — M25471 Effusion, right ankle: Secondary | ICD-10-CM | POA: Diagnosis not present

## 2024-05-10 DIAGNOSIS — N1831 Chronic kidney disease, stage 3a: Secondary | ICD-10-CM | POA: Diagnosis not present

## 2024-05-10 DIAGNOSIS — I5032 Chronic diastolic (congestive) heart failure: Secondary | ICD-10-CM | POA: Diagnosis not present

## 2024-05-10 DIAGNOSIS — M25472 Effusion, left ankle: Secondary | ICD-10-CM | POA: Diagnosis not present

## 2024-05-10 DIAGNOSIS — N183 Chronic kidney disease, stage 3 unspecified: Secondary | ICD-10-CM | POA: Insufficient documentation

## 2024-05-10 NOTE — Assessment & Plan Note (Signed)
 bp in fair control at this time  BP Readings from Last 1 Encounters:  05/10/24 128/74   No changes needed Most recent labs reviewed  Disc lifstyle change with low sodium diet and exercise  Blood pressure is stable with ibesartan 150 mg (down from 300)  Diltiazem  180 for this and rate control   Bmet today

## 2024-05-10 NOTE — Assessment & Plan Note (Signed)
 First day of lasix  helped/after that not  No pain  No shortness of breath or cp Suspect due to diastolic dysfunction but she is also on diltiazem  that can cause swelling Pt stopped lasix   Re check bmet today  Ref to cardiology for follow up  ? If higher dose lasix  would be helpful  Avoiding thiazides due to renal numbers

## 2024-05-10 NOTE — Progress Notes (Signed)
 Subjective:    Patient ID: Tammy Manning, female    DOB: November 11, 1935, 89 y.o.   MRN: [id number]  HPI  Wt Readings from Last 3 Encounters:  05/10/24 171 lb 6 oz (77.7 kg)  04/25/24 169 lb 8 oz (76.9 kg)  04/19/24 168 lb (76.2 kg)   29.42 kg/m  Vitals:   05/10/24 1557  BP: 128/74  Pulse: 79  Temp: 98.7 F (37.1 C)  SpO2: 97%    Pt presents for follow up of  Hyperkalemia  HTN  Chronic diastolic HF Ckd    HTN bp is stable today  No cp or palpitations or headaches or edema  No side effects to medicines  BP Readings from Last 3 Encounters:  05/10/24 128/74  04/25/24 (!) 130/58  04/19/24 122/60     Lab Results  Component Value Date   NA 135 04/27/2024   K 5.0 04/27/2024   CO2 24 04/27/2024   GLUCOSE 82 04/27/2024   BUN 25 (H) 04/27/2024   CREATININE 1.00 04/27/2024   CALCIUM  9.8 04/27/2024   GFR 50.80 (L) 04/27/2024   EGFR 64 01/11/2024   GFRNONAA 60 (L) 09/09/2023   Last visit we cut ARB dose in 1/2 due to high K  K went down from 5.8 to 5  Lasix  for pedal edema -urged to take for about a week total   Had neg urine culture so we stopped keflex    Lasix  did not help swelling at all  Breathing is fine   Is aware of heart fluttering of heart in evening Tranxene  helps     Patient Active Problem List   Diagnosis Date Noted   CKD (chronic kidney disease) stage 3, GFR 30-59 ml/min (HCC) 05/10/2024   Hyperkalemia 04/25/2024   Decreased GFR 04/25/2024   Ankle edema, bilateral 04/19/2024   Neck pain 03/02/2024   Pain of left scapula 02/23/2024   Rhomboid muscle pain 02/23/2024   Diabetes mellitus screening 01/27/2024   Thrush 01/27/2024   Anxious mood 09/20/2023   Tremor of both hands 09/20/2023   Head injury 04/28/2023   Superficial injury of skin 04/21/2023   Dysuria 04/12/2023   Diverticulosis 03/21/2023   Chronic diastolic heart failure (HCC) 03/13/2023   Obesity (BMI 30-39.9) 03/13/2023   PAF (paroxysmal atrial fibrillation) (HCC)  03/13/2023   Recurrent UTI (urinary tract infection) 02/18/2023   Intertrigo 02/16/2023   Hemorrhoids 02/02/2023   Urinary incontinence 02/02/2023   Dilated aortic root (HCC) 01/26/2023   Osteopenia 01/26/2023   Multinodular goiter 02/27/2022   Encounter for screening mammogram for breast cancer 01/23/2022   High risk medication use 10/07/2021   Atrial fibrillation with RVR (HCC) 04/28/2021   Hypertrophic toenail 04/22/2021   Seropositive rheumatoid arthritis (HCC) 11/25/2020   Bilateral hand pain 11/25/2020   Bilateral shoulder pain 10/16/2020   Joint pain 10/16/2020   Estrogen deficiency 04/30/2020   Dry mouth 03/15/2020   Osteoarthritis of right hip 01/02/2020   Right thyroid  nodule 05/12/2018   History of TIA (transient ischemic attack) 05/09/2018   Aortic insufficiency 09/10/2015   Stress reaction 09/08/2015   Encounter for Medicare annual wellness exam 02/28/2013   Bucket-handle tear of lateral meniscus of left knee as current injury 12/30/2012   Episodic atrial fibrillation (HCC) 11/17/2012   Hearing loss of both ears 01/14/2012   B12 deficiency 03/17/2010   GERD (gastroesophageal reflux disease) 03/17/2010   POSTMENOPAUSAL STATUS 08/20/2008   Essential hypertension 07/19/2007   Hyperlipidemia 06/24/2007   IBS 06/24/2007   DERMATITIS, ATOPIC 06/24/2007  SKIN CANCER, HX OF 06/24/2007   Past Medical History:  Diagnosis Date   A-fib (HCC)    Anginal pain (HCC)    Aortic insufficiency    a. 02/2020 Echo: EF 60-65%, no rwma, Gr1 DD, nl RV fxn, mildly dil LA, triv AI, Asc Ao 39mm; b. 04/2021 Echo: EF 60-65%, no rwma, GrI DD, nl RV fxn, mildly dil LA, AI not visualized. Mild-mod AoV sclerosis w/o stenosis.   Arthritis    Knees   B12 deficiency    Mild   Bucket-handle tear of lateral meniscus of left knee as current injury 12/30/2012   Cancer (HCC)    Skin, basal cell on nose and eyelid   Colitis    COVID    DJD (degenerative joint disease)    Low back   Dyspepsia     GERD (gastroesophageal reflux disease)    Heart murmur    Heart palpitations    History of cardiac cath    a. 02/2017 Cath: Nl cors. Nl LV fxn. Abd Aogram w/o RAS.   HOH (hard of hearing)    Hyperlipidemia    Hypertension    IBS (irritable bowel syndrome)    Lactose intolerance    Mixed incontinence    PONV (postoperative nausea and vomiting)    Urinary tract infection    Vulvar irritation    from urine pad, uses Triamcinolone  oint.   Past Surgical History:  Procedure Laterality Date   ABDOMINAL AORTOGRAM N/A 03/15/2017   Procedure: Abdominal Aortogram;  Surgeon: Wenona Hamilton, MD;  Location: [facility];  Service: Cardiovascular;  Laterality: N/A;   ABDOMINAL HYSTERECTOMY  1982   Large fibroids and bleeding   APPENDECTOMY     BILATERAL SALPINGOOPHORECTOMY  1986   BONE GRAFT HIP ILIAC CREST     To Left arm   BREAST REDUCTION SURGERY     BREAST SURGERY     CARDIAC CATHETERIZATION     CATARACT EXTRACTION W/PHACO Left 07/07/2016   Procedure: CATARACT EXTRACTION PHACO AND INTRAOCULAR LENS PLACEMENT (IOC);  Surgeon: Clair Crews, MD;  Location: [facility] ORS;  Service: Ophthalmology;  Laterality: Left;  US  01:10 AP% 18.3 CDE 12.91 Fluid pak lot # [id number]   CATARACT EXTRACTION W/PHACO Right 07/28/2016   Procedure: CATARACT EXTRACTION PHACO AND INTRAOCULAR LENS PLACEMENT (IOC);  Surgeon: Clair Crews, MD;  Location: [facility] OR[facility];  Service: Ophthalmology;  Laterality: Right;  US  01:26 AP% 18.5 CDE 16.12 Fluid pack lot # [id number]   COLONOSCOPY  07/2016   Dr. Aldean Hummingbird   COLONOSCOPY WITH PROPOFOL  N/A 04/01/2023   Procedure: COLONOSCOPY WITH PROPOFOL ;  Surgeon: Selena Daily, MD;  Location: [facility];  Service: Gastroenterology;  Laterality: N/A;   DILATION AND CURETTAGE OF UTERUS     miscarragesx2   ESOPHAGOGASTRODUODENOSCOPY     Polyps   HEMORRHOID SURGERY  2006   KNEE ARTHROSCOPY WITH LATERAL MENISECTOMY Left 12/30/2012   Procedure: KNEE ARTHROSCOPY WITH LATERAL  MENISECTOMY;  Surgeon: Neville Barbone, MD;  Location: [facility];  Service: Orthopedics;  Laterality: Left;  LEFT KNEE SCOPE LATERAL MENISCECTOMY   LEFT HEART CATH AND CORONARY ANGIOGRAPHY N/A 03/15/2017   Procedure: Left Heart Cath and Coronary Angiography;  Surgeon: Wenona Hamilton, MD;  Location: [facility];  Service: Cardiovascular;  Laterality: N/A;   MOUTH SURGERY     skin cancer eyelid  2012   TONSILLECTOMY     Social History   Tobacco Use   Smoking status: Former    Current  packs/day: 0.00    Average packs/day: 1 pack/day for 12.0 years (12.0 ttl pk-yrs)    Types: Cigarettes    Start date: 11/24/1951    Quit date: 11/24/1963    Years since quitting: 60.5    Passive exposure: Never   Smokeless tobacco: Never  Vaping Use   Vaping status: Never Used  Substance Use Topics   Alcohol use: No    Alcohol/week: 0.0 standard drinks of alcohol   Drug use: No   Family History  Problem Relation Age of Onset   Cancer Mother        Colon   Heart disease Father        CAD   Diabetes Sister    Hypothyroidism Sister    Diabetes Sister    Alzheimer's disease Sister    Diabetes Brother    Leukemia Brother    Esophageal cancer Brother    Diabetes Brother    Cancer Paternal Aunt        Breast   Breast cancer Daughter    Cancer Paternal Aunt    Cancer Brother    Diabetes Brother    Allergies  Allergen Reactions   Antihistamines, Chlorpheniramine-Type Other (See Comments)    Reaction:  Makes pt hyper    Atenolol Hypertension   Cefuroxime  Axetil Other (See Comments)    Upset stomach   Ciprofloxacin Other (See Comments)    Upset stomach   Co Q 10 [Coenzyme Q10] Other (See Comments)    Reaction:  GI upset    Crestor  [Rosuvastatin  Calcium ] Other (See Comments)    Reaction:  Myalgias    Dextromethorphan-Guaifenesin Other (See Comments)    Reaction:  Made pt jittery    Diflucan  [Fluconazole ] Other (See Comments)    dizzy   Elavil  [Amitriptyline ]      Felt worse /tearful    Epinephrine  Hives   Methenamine      Tongue discomfort    Ramipril Other (See Comments)    Upset stomach   Sulfamethoxazole-Trimethoprim  Other (See Comments)    Upset stomach   Vitamin D  Analogs Other (See Comments)    Reaction:  GI upset    Current Outpatient Medications on File Prior to Visit  Medication Sig Dispense Refill   atorvastatin  (LIPITOR) 10 MG tablet Take 1 tablet (10 mg total) by mouth daily. 90 tablet 3   Calcium  Carbonate-Vitamin D  (CALCIUM  600+D PO) Take 1 tablet by mouth daily.      clobetasol  ointment (TEMOVATE ) 0.05 % Apply 1 application topically 2 (two) times daily as needed.     clorazepate  (TRANXENE ) 7.5 MG tablet TAKE ONE TABLET BY MOUTH ONCE DAILY AS NEEDED 30 tablet 0   cyanocobalamin  (,VITAMIN B-12,) 1000 MCG/ML injection Inject 1,000 mcg into the muscle every 30 (thirty) days.      diltiazem  (CARDIZEM  CD) 180 MG 24 hr capsule TAKE 1 CAPSULE BY MOUTH EVERY DAY 90 capsule 3   ELIQUIS  5 MG TABS tablet TAKE 1 TABLET BY MOUTH TWICE DAILY 180 tablet 1   estradiol  (ESTRACE ) 0.1 MG/GM vaginal cream Apply a pea-sized amount to fingertip and wipe in vaginal introitus twice weekly 42.5 g 1   famotidine  (PEPCID ) 20 MG tablet Take 20 mg by mouth 2 (two) times daily as needed for heartburn or indigestion.     guaiFENesin (MUCINEX) 600 MG 12 hr tablet Take 600 mg by mouth 2 (two) times daily as needed.     hydrocortisone  (ANUSOL -HC) 2.5 % rectal cream Apply 1 Application topically at bedtime. To affected  area for 10 days 30 g 0   hydrocortisone  (ANUSOL -HC) 25 MG suppository Place 1 suppository (25 mg total) rectally 2 (two) times daily. 10 suppository 0   irbesartan  (AVAPRO ) 300 MG tablet TAKE 1 TABLET BY MOUTH DAILY (Patient taking differently: Take 150 mg by mouth daily.) 90 tablet 2   loperamide  (IMODIUM ) 2 MG capsule Take 2 mg by mouth as needed for diarrhea or loose stools.     methotrexate  250 MG/10ML injection INJECT 0.6 MLS INTO THE SKIN ONCE A  WEEK 4 mL 2   nystatin  (MYCOSTATIN ) 100000 UNIT/ML suspension Take 5 mLs (500,000 Units total) by mouth 3 (three) times daily. Swish and swallow 120 mL 1   triamcinolone  (NASACORT ) 55 MCG/ACT AERO nasal inhaler Place 2 sprays into the nose daily. 1 each 12   Tuberculin-Allergy  Syringes 27G X 1/2 1 ML MISC Use 1 syringe once weekly to inject methotrexate . 25 each 1   No current facility-administered medications on file prior to visit.    Review of Systems  Constitutional:  Negative for activity change, appetite change, fatigue, fever and unexpected weight change.  HENT:  Negative for congestion, ear pain, rhinorrhea, sinus pressure and sore throat.   Eyes:  Negative for pain, redness and visual disturbance.  Respiratory:  Negative for cough, shortness of breath and wheezing.   Cardiovascular:  Positive for palpitations and leg swelling. Negative for chest pain.  Gastrointestinal:  Negative for abdominal pain, blood in stool, constipation and diarrhea.  Endocrine: Negative for polydipsia and polyuria.  Genitourinary:  Positive for frequency. Negative for dysuria and urgency.  Musculoskeletal:  Negative for arthralgias, back pain and myalgias.  Skin:  Negative for pallor and rash.  Allergic/Immunologic: Negative for environmental allergies.  Neurological:  Negative for dizziness, syncope and headaches.  Hematological:  Negative for adenopathy. Does not bruise/bleed easily.  Psychiatric/Behavioral:  Negative for decreased concentration and dysphoric mood. The patient is nervous/anxious.        Objective:   Physical Exam Constitutional:      General: She is not in acute distress.    Appearance: Normal appearance. She is well-developed. She is obese. She is not ill-appearing or diaphoretic.  HENT:     Head: Normocephalic and atraumatic.   Eyes:     Conjunctiva/sclera: Conjunctivae normal.     Pupils: Pupils are equal, round, and reactive to light.   Neck:     Thyroid : No  thyromegaly.     Vascular: No carotid bruit or JVD.   Cardiovascular:     Rate and Rhythm: Normal rate and regular rhythm.     Heart sounds: Normal heart sounds.     No gallop.  Pulmonary:     Effort: Pulmonary effort is normal. No respiratory distress.     Breath sounds: Normal breath sounds. No wheezing or rales.  Abdominal:     General: There is no distension or abdominal bruit.     Palpations: Abdomen is soft.   Musculoskeletal:     Cervical back: Normal range of motion and neck supple.     Right lower leg: Edema present.     Left lower leg: Edema present.     Comments: One plus pedal edema bilaterally     Lymphadenopathy:     Cervical: No cervical adenopathy.   Skin:    General: Skin is warm and dry.     Coloration: Skin is not pale.     Findings: No rash.   Neurological:     Mental Status: She is  alert.     Coordination: Coordination normal.     Deep Tendon Reflexes: Reflexes are normal and symmetric. Reflexes normal.   Psychiatric:        Mood and Affect: Mood normal.           Assessment & Plan:   Problem List Items Addressed This Visit       Cardiovascular and Mediastinum   Essential hypertension - Primary   bp in fair control at this time  BP Readings from Last 1 Encounters:  05/10/24 128/74   No changes needed Most recent labs reviewed  Disc lifstyle change with low sodium diet and exercise  Blood pressure is stable with ibesartan 150 mg (down from 300)  Diltiazem  180 for this and rate control   Bmet today      Relevant Orders   Basic metabolic panel with GFR   Chronic diastolic heart failure (HCC)   ? If cause of pedal edema No c/o shortness of breath or cp  Will schedule follow up with cardiology      Relevant Orders   Ambulatory referral to Cardiology   Atrial fibrillation with RVR (HCC)   Pt notes more palpitations in evening  Also pedal edema  Will refer for cardiology f/u        Genitourinary   Recurrent UTI (urinary  tract infection)   Last ucx negative Also improved gfr       CKD (chronic kidney disease) stage 3, GFR 30-59 ml/min (HCC)   Last GFR 50.80 with K of 5.0 Improved from previous  Drinking more water Arb was cut in 1/2 also   Lab today        Other   Hyperkalemia   Improved after cutting ibesartan in 1/2  Also better GFR  Re check today Was taking lasix  briefly-no longer on this   Is drinking more water as well      Relevant Orders   Basic metabolic panel with GFR   Ankle edema, bilateral   First day of lasix  helped/after that not  No pain  No shortness of breath or cp Suspect due to diastolic dysfunction but she is also on diltiazem  that can cause swelling Pt stopped lasix   Re check bmet today  Ref to cardiology for follow up  ? If higher dose lasix  would be helpful  Avoiding thiazides due to renal numbers       Relevant Orders   Ambulatory referral to Cardiology

## 2024-05-10 NOTE — Assessment & Plan Note (Signed)
 Last GFR 50.80 with K of 5.0 Improved from previous  Drinking more water Arb was cut in 1/2 also   Lab today

## 2024-05-10 NOTE — Patient Instructions (Addendum)
 Labs today for kidney function and potassium   I put the referral in for cardiology so you can follow up about the palpitations and the swelling  Please let us  know if you don't hear in 1-2 weeks to set that up  Or you can call to set up the appointment

## 2024-05-10 NOTE — Assessment & Plan Note (Signed)
 Improved after cutting ibesartan in 1/2  Also better GFR  Re check today Was taking lasix  briefly-no longer on this   Is drinking more water as well

## 2024-05-10 NOTE — Assessment & Plan Note (Signed)
 Pt notes more palpitations in evening  Also pedal edema  Will refer for cardiology f/u

## 2024-05-10 NOTE — Assessment & Plan Note (Signed)
?   If cause of pedal edema No c/o shortness of breath or cp  Will schedule follow up with cardiology

## 2024-05-10 NOTE — Assessment & Plan Note (Signed)
 Last ucx negative Also improved gfr

## 2024-05-11 ENCOUNTER — Ambulatory Visit: Payer: Self-pay | Admitting: Family Medicine

## 2024-05-11 LAB — BASIC METABOLIC PANEL WITH GFR
BUN: 18 mg/dL (ref 6–23)
CO2: 25 meq/L (ref 19–32)
Calcium: 9.4 mg/dL (ref 8.4–10.5)
Chloride: 104 meq/L (ref 96–112)
Creatinine, Ser: 0.81 mg/dL (ref 0.40–1.20)
GFR: 65.4 mL/min (ref >60.00)
Glucose, Bld: 101 mg/dL — ABNORMAL HIGH (ref 70–99)
Potassium: 5 meq/L (ref 3.5–5.1)
Sodium: 136 meq/L (ref 135–145)

## 2024-05-13 ENCOUNTER — Encounter: Payer: Self-pay | Admitting: Intensive Care

## 2024-05-13 ENCOUNTER — Emergency Department
Admission: EM | Admit: 2024-05-13 | Discharge: 2024-05-13 | Disposition: A | Discharge status: Home / Self Care | Attending: Emergency Medicine | Admitting: Emergency Medicine

## 2024-05-13 ENCOUNTER — Other Ambulatory Visit: Payer: Self-pay

## 2024-05-13 DIAGNOSIS — X58XXXA Exposure to other specified factors, initial encounter: Secondary | ICD-10-CM | POA: Insufficient documentation

## 2024-05-13 DIAGNOSIS — R600 Localized edema: Secondary | ICD-10-CM | POA: Diagnosis not present

## 2024-05-13 DIAGNOSIS — I10 Essential (primary) hypertension: Secondary | ICD-10-CM | POA: Insufficient documentation

## 2024-05-13 DIAGNOSIS — S81802A Unspecified open wound, left lower leg, initial encounter: Secondary | ICD-10-CM | POA: Insufficient documentation

## 2024-05-13 DIAGNOSIS — S8012XA Contusion of left lower leg, initial encounter: Secondary | ICD-10-CM | POA: Diagnosis not present

## 2024-05-13 DIAGNOSIS — E119 Type 2 diabetes mellitus without complications: Secondary | ICD-10-CM | POA: Diagnosis not present

## 2024-05-13 NOTE — ED Provider Notes (Signed)
 [facility] Emergency Department Provider Note     Event Date/Time   First MD Initiated Contact with Patient 05/13/24 1251     (approximate)   History   No chief complaint on file.   HPI  Tammy Manning is a 89 y.o. female with a history of cancer, CHF, HTN, HLD, GERD, and A-fib on Eliquis , presents to the ED endorsing a superficial leg abrasion.  She describes a blood-filled blister to the posterior leg that she has been covering over the last several days.  She was concerned because there was some scant amount of bleeding on the bandage today.  Patient denies any recent injury or trauma.  She is also been evaluated by her PCP for chronic bilateral lower extremity swelling.   Physical Exam   Triage Vital Signs: ED Triage Vitals [05/13/24 1233]  Encounter Vitals Group     BP 95/65     Girls Systolic BP Percentile      Girls Diastolic BP Percentile      Boys Systolic BP Percentile      Boys Diastolic BP Percentile      Pulse Rate 84     Resp 18     Temp 98.8 F (37.1 C)     Temp Source Oral     SpO2 100 %     Weight 170 lb (77.1 kg)     Height 5' 4.5 (1.638 m)     Head Circumference      Peak Flow      Pain Score 0     Pain Loc      Pain Education      Exclude from Growth Chart     Most recent vital signs: Vitals:   05/13/24 1233  BP: 95/65  Pulse: 84  Resp: 18  Temp: 98.8 F (37.1 C)  SpO2: 100%    General Awake, no distress. NAD HEENT NCAT. PERRL. EOMI. No rhinorrhea. Mucous membranes are moist.  CV:  Good peripheral perfusion. 1+ pitting edema BLE RESP:  Normal effort.  SKIN:  Posterior left calf with a 3 cm x 2 cm superficial hematoma noted.  No active bleeding no weeping at this time.  Patient with several superficial areas of abrasion and ecchymosis to the UE/LE bilaterally.   ED Results / Procedures / Treatments   Labs (all labs ordered are listed, but only abnormal results are displayed) Labs Reviewed - No data  to display   EKG   RADIOLOGY  No results found.   PROCEDURES:  Critical Care performed: No  Procedures   MEDICATIONS ORDERED IN ED: Medications - No data to display   IMPRESSION / MDM / ASSESSMENT AND PLAN / ED COURSE  I reviewed the triage vital signs and the nursing notes.                              Differential diagnosis includes, but is not limited to, hematoma, skin tear, laceration, abrasion, contusion  Patient's presentation is most consistent with acute, uncomplicated illness.  Patient's diagnosis is consistent with superficial hematoma secondary to local trauma.  Patient presents with an intact hematoma to superficial skin of the posterior left calf.  Symptoms are complicated by the patient's anticoagulation therapy.  No active bleeding or weeping at this time.  Skin is otherwise intact without indication for intervention or hematoma drainage.  No calf tenderness distally.  Patient with stable BLE edema being managed  and followed by her PCP and cardiology.  Patient is reassured by the stable and self-limited nature of this superficial hematoma, with no concern for DVT or PE.  Patient will be discharged home with wound care instructions. Patient is to follow up with her PCP or specialist as discussed, as needed or otherwise directed. Patient is given ED precautions to return to the ED for any worsening or new symptoms.   FINAL CLINICAL IMPRESSION(S) / ED DIAGNOSES   Final diagnoses:  Hematoma of left lower leg  Peripheral edema     Rx / DC Orders   ED Discharge Orders     None        Note:  This document was prepared using Dragon voice recognition software and may include unintentional dictation errors.    Loyd Candida LULLA Aldona, PA-C 05/13/24 1725    Dorothyann Drivers, MD 05/13/24 DANIAL

## 2024-05-13 NOTE — Discharge Instructions (Signed)
 Your exam is normal and reassuring at this time.  It shows a superficial hematoma due to a recent local injury.  This is complicated by your blood thinner use.  The area keep the area clean, dry, and covered as necessary.  It will resolve in time.  Be sure that any dressing use is not compressive or restricted to the leg.  You also should consider wearing compression socks to help reduce your lower extremity edema.  Follow-up with your primary provider or cardiologist as discussed.

## 2024-05-13 NOTE — ED Triage Notes (Signed)
 Patient being seen today for blister present on left calf and reports bloody drainage.   Patient has also been seeing her pcp about bilateral feet swelling   Patient takes eliquis  daily.

## 2024-05-13 NOTE — ED Notes (Signed)
 Pt reports she takes Eliquis  and her skin is very fragile. Pt reports she was sitting on a rocking chair and the back of her left calf rubbed against the chair and developed a blister that started to bleed. Pt reports she put a dressing on it for a week but today she took it off and area continues to ooze. Bruise noted left calf. Pedal edema to left lower leg and right foot swelling noted

## 2024-05-13 NOTE — ED Notes (Signed)
 Pt verbalizes understanding of discharge instructions.

## 2024-05-25 ENCOUNTER — Ambulatory Visit: Attending: Cardiovascular Disease | Admitting: Cardiovascular Disease

## 2024-05-25 ENCOUNTER — Encounter: Payer: Self-pay | Admitting: Cardiovascular Disease

## 2024-05-25 VITALS — BP 136/62 | HR 81 | Ht 64.5 in | Wt 172.2 lb

## 2024-05-25 DIAGNOSIS — I10 Essential (primary) hypertension: Secondary | ICD-10-CM | POA: Diagnosis not present

## 2024-05-25 DIAGNOSIS — E785 Hyperlipidemia, unspecified: Secondary | ICD-10-CM | POA: Insufficient documentation

## 2024-05-25 DIAGNOSIS — I5032 Chronic diastolic (congestive) heart failure: Secondary | ICD-10-CM | POA: Insufficient documentation

## 2024-05-25 DIAGNOSIS — I480 Paroxysmal atrial fibrillation: Secondary | ICD-10-CM | POA: Insufficient documentation

## 2024-05-25 MED ORDER — FUROSEMIDE 20 MG PO TABS: 20.0000 mg | ORAL_TABLET | Freq: Every day | ORAL | 0 refills | Status: DC

## 2024-05-25 NOTE — Patient Instructions (Signed)
 Medication Instructions:  START Furosemide  20 mg once daily.  -for the first three days, take 40 mg once daily (two of the 20 mg tablets), then go to 20 mg once daily.  *If you need a refill on your cardiac medications before your next appointment, please call your pharmacy*  Lab Work: Your provider would like for you to return in one week to have the following labs drawn: BMET.   Please go to [facility] [address] ([facility] [address], [place] [address] You do not need an appointment.  They are open from 8 am- 4:30 pm.  Lunch from 1:00 pm- 2:00 pm You will not need to be fasting.   You may also go to one of the following [facility]:  [address] [place], [place] [address] Phone: [phone] Lab hours: Mon-Fri 8 am- 5 pm    Lunch 12 pm- 1 pm  [address] [place],  [place]  [address]  [place]  Phone: [phone] Lab hours: 7 am- 4 pm Lunch 12 pm-1 pm   [address] [place],  [place]  [address]  [place]  Phone: [phone] Lab hours: Mon-Fri 8 am- 5 pm    Lunch 12 pm- 1 pm  If you have labs (blood work) drawn today and your tests are completely normal, you will receive your results only by: MyChart Message (if you have MyChart) OR A paper copy in the mail If you have any lab test that is abnormal or we need to change your treatment, we will call you to review the results.  Testing/Procedures: None ordered  Follow-Up: At [facility], you and your health needs are our priority.  As part of our continuing mission to provide you with exceptional heart care, our providers are all part of one team.  This team includes your primary Cardiologist (physician) and Advanced Practice Providers or APPs (Physician Assistants and Nurse Practitioners) who all work together to provide you with the care you need, when you need it.  Your next appointment:   2 month(s)  Provider:   You will see one of the following Advanced Practice Providers on your designated  Care Team:   Lonni Meager, NP Lesley Maffucci, PA-C Bernardino Bring, PA-C Cadence Hyannis, PA-C Tylene Lunch, NP Barnie Hila, NP  We recommend signing up for the patient portal called MyChart.  Sign up information is provided on this After Visit Summary.  MyChart is used to connect with patients for Virtual Visits (Telemedicine).  Patients are able to view lab/test results, encounter notes, upcoming appointments, etc.  Non-urgent messages can be sent to your provider as well.   To learn more about what you can do with MyChart, go to [email or web address].

## 2024-05-25 NOTE — Progress Notes (Signed)
 Cardiology Office Note   Date:  05/25/2024   ID:  Tammy Manning, DOB 03/22/1935, MRN [id number]  PCP:  Randeen Laine LABOR, MD  Cardiologist:  Deatrice Cage, MD   Chief Complaint  Patient presents with   Follow-up    CHF/ Bilateral Edema. Meds reviewed verbally with pt.       History of Present Illness: Tammy Manning is a 89 y.o. female who presents for a follow-up visit regarding paroxysmal atrial fibrillation.  She has intolerance to multiple antihypertensive medications.  Cardiac catheterization in April 2018 showed normal coronary arteries and normal LV systolic function.  Abdominal aortogram showed no evidence of renal artery stenosis.    Previous echocardiogram in June 2019 showed an EF of 55 - 60%, mild LVH and mild aortic insufficiency.    She was hospitalized in June of 2022 for atrial fibrillation.  She took an extra dose of chlorthalidone  for leg edema and presented with increased fatigue and palpitations.  She was noted to be in atrial fibrillation with RVR.  She was started on diltiazem  drip and converted to sinus rhythm.  She was noted to be mildly hypokalemic. The patient was switched from chlorthalidone  to diltiazem .  CHA2DS2-VASc score was 4 and thus she was started on anticoagulation with Eliquis  5 mg twice daily.  She had COVID in September, 2024 and felt significant shortness of breath and fatigue after that.  Echocardiogram in November showed normal LV systolic function with mildly calcified aortic valve without significant stenosis and mild aortic insufficiency.  She was seen by Dr. Randeen recently for increased lower extremity edema.  She has been having issues with recurrent UTIs.  She had labs done which showed normal BNP.  CMP was unremarkable with normal albumin.  She does have mild anemia which has been stable.  Lower extremity edema is worse at the end of the day and she does have some shortness of breath and palpitations.  No chest pain.  She is very  upset about the edema.  Past Medical History:  Diagnosis Date   A-fib (HCC)    Anginal pain (HCC)    Aortic insufficiency    a. 02/2020 Echo: EF 60-65%, no rwma, Gr1 DD, nl RV fxn, mildly dil LA, triv AI, Asc Ao 39mm; b. 04/2021 Echo: EF 60-65%, no rwma, GrI DD, nl RV fxn, mildly dil LA, AI not visualized. Mild-mod AoV sclerosis w/o stenosis.   Arthritis    Knees   B12 deficiency    Mild   Bucket-handle tear of lateral meniscus of left knee as current injury 12/30/2012   Cancer (HCC)    Skin, basal cell on nose and eyelid   Colitis    COVID    DJD (degenerative joint disease)    Low back   Dyspepsia    GERD (gastroesophageal reflux disease)    Heart murmur    Heart palpitations    History of cardiac cath    a. 02/2017 Cath: Nl cors. Nl LV fxn. Abd Aogram w/o RAS.   HOH (hard of hearing)    Hyperlipidemia    Hypertension    IBS (irritable bowel syndrome)    Lactose intolerance    Mixed incontinence    PONV (postoperative nausea and vomiting)    Urinary tract infection    Vulvar irritation    from urine pad, uses Triamcinolone  oint.    Past Surgical History:  Procedure Laterality Date   ABDOMINAL AORTOGRAM N/A 03/15/2017   Procedure: Abdominal Aortogram;  Surgeon: Deatrice  DELENA Cage, MD;  Location: [facility];  Service: Cardiovascular;  Laterality: N/A;   ABDOMINAL HYSTERECTOMY  1982   Large fibroids and bleeding   APPENDECTOMY     BILATERAL SALPINGOOPHORECTOMY  1986   BONE GRAFT HIP ILIAC CREST     To Left arm   BREAST REDUCTION SURGERY     BREAST SURGERY     CARDIAC CATHETERIZATION     CATARACT EXTRACTION W/PHACO Left 07/07/2016   Procedure: CATARACT EXTRACTION PHACO AND INTRAOCULAR LENS PLACEMENT (IOC);  Surgeon: Elsie Carmine, MD;  Location: [facility];  Service: Ophthalmology;  Laterality: Left;  US  01:10 AP% 18.3 CDE 12.91 Fluid pak lot # [id number]   CATARACT EXTRACTION W/PHACO Right 07/28/2016   Procedure: CATARACT EXTRACTION PHACO AND INTRAOCULAR LENS  PLACEMENT (IOC);  Surgeon: Elsie Carmine, MD;  Location: [facility];  Service: Ophthalmology;  Laterality: Right;  US  01:26 AP% 18.5 CDE 16.12 Fluid pack lot # [id number]   COLONOSCOPY  07/2016   Dr. Kemper   COLONOSCOPY WITH PROPOFOL  N/A 04/01/2023   Procedure: COLONOSCOPY WITH PROPOFOL ;  Surgeon: Unk Corinn Skiff, MD;  Location: [facility];  Service: Gastroenterology;  Laterality: N/A;   DILATION AND CURETTAGE OF UTERUS     miscarragesx2   ESOPHAGOGASTRODUODENOSCOPY     Polyps   HEMORRHOID SURGERY  2006   KNEE ARTHROSCOPY WITH LATERAL MENISECTOMY Left 12/30/2012   Procedure: KNEE ARTHROSCOPY WITH LATERAL MENISECTOMY;  Surgeon: Fonda SHAUNNA Olmsted, MD;  Location: [facility];  Service: Orthopedics;  Laterality: Left;  LEFT KNEE SCOPE LATERAL MENISCECTOMY   LEFT HEART CATH AND CORONARY ANGIOGRAPHY N/A 03/15/2017   Procedure: Left Heart Cath and Coronary Angiography;  Surgeon: Deatrice DELENA Cage, MD;  Location: [facility];  Service: Cardiovascular;  Laterality: N/A;   MOUTH SURGERY     skin cancer eyelid  2012   TONSILLECTOMY       Current Outpatient Medications  Medication Sig Dispense Refill   atorvastatin  (LIPITOR) 10 MG tablet Take 1 tablet (10 mg total) by mouth daily. 90 tablet 3   Calcium  Carbonate-Vitamin D  (CALCIUM  600+D PO) Take 1 tablet by mouth daily.      clobetasol  ointment (TEMOVATE ) 0.05 % Apply 1 application topically 2 (two) times daily as needed.     clorazepate  (TRANXENE ) 7.5 MG tablet TAKE ONE TABLET BY MOUTH ONCE DAILY AS NEEDED 30 tablet 0   cyanocobalamin  (,VITAMIN B-12,) 1000 MCG/ML injection Inject 1,000 mcg into the muscle every 30 (thirty) days.      diltiazem  (CARDIZEM  CD) 180 MG 24 hr capsule TAKE 1 CAPSULE BY MOUTH EVERY DAY 90 capsule 3   ELIQUIS  5 MG TABS tablet TAKE 1 TABLET BY MOUTH TWICE DAILY 180 tablet 1   famotidine  (PEPCID ) 20 MG tablet Take 20 mg by mouth 2 (two) times daily as needed for heartburn or indigestion.      guaiFENesin (MUCINEX) 600 MG 12 hr tablet Take 600 mg by mouth 2 (two) times daily as needed.     hydrocortisone  (ANUSOL -HC) 25 MG suppository Place 1 suppository (25 mg total) rectally 2 (two) times daily. 10 suppository 0   irbesartan  (AVAPRO ) 300 MG tablet TAKE 1 TABLET BY MOUTH DAILY (Patient taking differently: Take 150 mg by mouth daily.) 90 tablet 2   loperamide  (IMODIUM ) 2 MG capsule Take 2 mg by mouth as needed for diarrhea or loose stools.     methotrexate  250 MG/10ML injection INJECT 0.6 MLS INTO THE SKIN ONCE A WEEK 4 mL 2  triamcinolone  (NASACORT ) 55 MCG/ACT AERO nasal inhaler Place 2 sprays into the nose daily. 1 each 12   Tuberculin-Allergy  Syringes 27G X 1/2 1 ML MISC Use 1 syringe once weekly to inject methotrexate . 25 each 1   estradiol  (ESTRACE ) 0.1 MG/GM vaginal cream Apply a pea-sized amount to fingertip and wipe in vaginal introitus twice weekly (Patient not taking: Reported on 05/25/2024) 42.5 g 1   hydrocortisone  (ANUSOL -HC) 2.5 % rectal cream Apply 1 Application topically at bedtime. To affected area for 10 days (Patient not taking: Reported on 05/25/2024) 30 g 0   nystatin  (MYCOSTATIN ) 100000 UNIT/ML suspension Take 5 mLs (500,000 Units total) by mouth 3 (three) times daily. Swish and swallow (Patient not taking: Reported on 05/25/2024) 120 mL 1   No current facility-administered medications for this visit.    Allergies:   Antihistamines, chlorpheniramine-type; Atenolol; Cefuroxime  axetil; Ciprofloxacin; Co q 10 [coenzyme q10]; Crestor  [rosuvastatin  calcium ]; Dextromethorphan-guaifenesin; Diflucan  [fluconazole ]; Elavil  [amitriptyline ]; Epinephrine ; Methenamine ; Ramipril; Sulfamethoxazole-trimethoprim ; and Vitamin d  analogs    Social History:  The patient  reports that she quit smoking about 60 years ago. Her smoking use included cigarettes. She started smoking about 72 years ago. She has a 12 pack-year smoking history. She has never been exposed to tobacco smoke. She has never  used smokeless tobacco. She reports that she does not drink alcohol and does not use drugs.   Family History:  The patient's family history includes Alzheimer's disease in her sister; Breast cancer in her daughter; Cancer in her brother, mother, paternal aunt, and paternal aunt; Diabetes in her brother, brother, brother, sister, and sister; Esophageal cancer in her brother; Heart disease in her father; Hypothyroidism in her sister; Leukemia in her brother.    ROS:  Please see the history of present illness.   Otherwise, review of systems are positive for none.   All other systems are reviewed and negative.    PHYSICAL EXAM: VS:  BP 136/62 (BP Location: Left Arm, Patient Position: Sitting, Cuff Size: Normal)   Pulse 81   Ht 5' 4.5 (1.638 m)   Wt 172 lb 4 oz (78.1 kg)   SpO2 98%   BMI 29.11 kg/m  , BMI Body mass index is 29.11 kg/m. GEN: Well nourished, well developed, in no acute distress  HEENT: normal  Neck: no JVD, carotid bruits, or masses Cardiac: RRR; no  rubs, or gallops, 2/6 systolic murmur in the aortic area.  Moderate bilateral lower extremity edema. Respiratory:  clear to auscultation bilaterally, normal work of breathing GI: soft, nontender, nondistended, + BS MS: no deformity or atrophy  Skin: warm and dry, no rash Neuro:  Strength and sensation are intact Psych: euthymic mood, full affect   EKG:  EKG is ordered today. The ekg ordered today demonstrates : Normal sinus rhythm Minimal voltage criteria for LVH, may be normal variant ( R in aVL ) When compared with ECG of 30-Mar-2024 11:54, No significant change was found   Recent Labs: 01/27/2024: TSH 0.85 04/19/2024: ALT 13; Hemoglobin 11.2; Platelets 370.0; Pro B Natriuretic peptide (BNP) 77.0 05/10/2024: BUN 18; Creatinine, Ser 0.81; Potassium 5.0; Sodium 136    Lipid Panel    Component Value Date/Time   CHOL 229 (H) 01/27/2024 1135   CHOL 196 02/02/2020 1348   TRIG 150.0 (H) 01/27/2024 1135   HDL 88.30  01/27/2024 1135   HDL 80 02/02/2020 1348   CHOLHDL 3 01/27/2024 1135   VLDL 30.0 01/27/2024 1135   LDLCALC 111 (H) 01/27/2024 1135   LDLCALC 93  02/02/2020 1348   LDLDIRECT 106 (H) 02/02/2020 1348   LDLDIRECT 105.9 02/08/2013 1050      Wt Readings from Last 3 Encounters:  05/25/24 172 lb 4 oz (78.1 kg)  05/13/24 170 lb (77.1 kg)  05/10/24 171 lb 6 oz (77.7 kg)           No data to display            ASSESSMENT AND PLAN:   1.  Paroxysmal atrial fibrillation: She is doing well overall with minimal palpitations at the present time.  Continue current dose of diltiazem .  Continue anticoagulation with Eliquis  5 mg twice daily.    2.  Lower extremity edema: I suspect this is likely multifactorial due to chronic venous insufficiency as well as treatment with diltiazem .  There might be a component of chronic diastolic heart failure.  I elected to start her on furosemide  40 mg daily for 3 days then 20 mg once daily after that.  Will check basic metabolic profile in 1 week. She is not a good candidate for an SGLT2 inhibitor due to recurrent UTIs. Spironolactone is not a good option given recent hyperkalemia. I advised her to elevate her legs during the day and use knee-high support stockings as well.  3. Essential hypertension:   Blood pressure is reasonably controlled.  4. Hyperlipidemia: No documented history of coronary artery disease.  Most recent lipid profile showed an LDL of 111.  She is currently on atorvastatin  10 mg daily.     Disposition:   FU in 1 month to recheck.   Signed,  Deatrice Cage, MD 05/25/24 [facility] [place], [place] [phone]

## 2024-05-29 ENCOUNTER — Encounter: Payer: Self-pay | Admitting: Emergency Medicine

## 2024-05-29 ENCOUNTER — Other Ambulatory Visit: Payer: Self-pay

## 2024-05-29 ENCOUNTER — Emergency Department
Admission: EM | Admit: 2024-05-29 | Discharge: 2024-05-29 | Disposition: A | Discharge status: Home / Self Care | Attending: Emergency Medicine | Admitting: Emergency Medicine

## 2024-05-29 DIAGNOSIS — Z7901 Long term (current) use of anticoagulants: Secondary | ICD-10-CM | POA: Insufficient documentation

## 2024-05-29 DIAGNOSIS — I509 Heart failure, unspecified: Secondary | ICD-10-CM | POA: Diagnosis not present

## 2024-05-29 DIAGNOSIS — L03115 Cellulitis of right lower limb: Secondary | ICD-10-CM | POA: Insufficient documentation

## 2024-05-29 DIAGNOSIS — W228XXA Striking against or struck by other objects, initial encounter: Secondary | ICD-10-CM | POA: Diagnosis not present

## 2024-05-29 DIAGNOSIS — S8991XA Unspecified injury of right lower leg, initial encounter: Secondary | ICD-10-CM | POA: Diagnosis present

## 2024-05-29 DIAGNOSIS — S81801A Unspecified open wound, right lower leg, initial encounter: Secondary | ICD-10-CM | POA: Insufficient documentation

## 2024-05-29 LAB — CBC WITH DIFFERENTIAL/PLATELET
Abs Immature Granulocytes: 0.05 K/uL (ref 0.00–0.07)
Basophils Absolute: 0 K/uL (ref 0.0–0.1)
Basophils Relative: 1 %
Eosinophils Absolute: 0 K/uL (ref 0.0–0.5)
Eosinophils Relative: 1 %
HCT: 34 % — ABNORMAL LOW (ref 36.0–46.0)
Hemoglobin: 10.9 g/dL — ABNORMAL LOW (ref 12.0–15.0)
Immature Granulocytes: 1 %
Lymphocytes Relative: 22 %
Lymphs Abs: 2 K/uL (ref 0.7–4.0)
MCH: 33.9 pg (ref 26.0–34.0)
MCHC: 32.1 g/dL (ref 30.0–36.0)
MCV: 105.6 fL — ABNORMAL HIGH (ref 80.0–100.0)
Monocytes Absolute: 0.5 K/uL (ref 0.1–1.0)
Monocytes Relative: 6 %
Neutro Abs: 6.2 K/uL (ref 1.7–7.7)
Neutrophils Relative %: 69 %
Platelets: 463 K/uL — ABNORMAL HIGH (ref 150–400)
RBC: 3.22 MIL/uL — ABNORMAL LOW (ref 3.87–5.11)
RDW: 15.1 % (ref 11.5–15.5)
WBC: 8.8 K/uL (ref 4.0–10.5)
nRBC: 0 % (ref 0.0–0.2)

## 2024-05-29 LAB — COMPREHENSIVE METABOLIC PANEL WITH GFR
ALT: 15 U/L (ref 0–44)
AST: 18 U/L (ref 15–41)
Albumin: 3.6 g/dL (ref 3.5–5.0)
Alkaline Phosphatase: 41 U/L (ref 38–126)
Anion gap: 10 (ref 5–15)
BUN: 20 mg/dL (ref 8–23)
CO2: 24 mmol/L (ref 22–32)
Calcium: 9.3 mg/dL (ref 8.9–10.3)
Chloride: 103 mmol/L (ref 98–111)
Creatinine, Ser: 0.84 mg/dL (ref 0.44–1.00)
GFR, Estimated: >60 mL/min (ref >60)
Glucose, Bld: 91 mg/dL (ref 70–99)
Potassium: 3.8 mmol/L (ref 3.5–5.1)
Sodium: 137 mmol/L (ref 135–145)
Total Bilirubin: 0.4 mg/dL (ref 0.0–1.2)
Total Protein: 6.5 g/dL (ref 6.5–8.1)

## 2024-05-29 LAB — LACTIC ACID, PLASMA: Lactic Acid, Venous: 0.8 mmol/L (ref 0.5–1.9)

## 2024-05-29 MED ORDER — CLINDAMYCIN HCL 300 MG PO CAPS: 300.0000 mg | ORAL_CAPSULE | Freq: Three times a day (TID) | ORAL | 0 refills | Status: AC
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
## 2024-05-29 NOTE — Discharge Instructions (Signed)
 You are seen in the ER today for evaluation of pain in your right leg.  I suspect you have developed a small area of infection around your wound.  I sent a prescription for antibiotics to your pharmacy.  Please take these as directed.  Follow with your primary care doctor within a few days for reevaluation.  Return to the ER for any new or worsening symptoms.

## 2024-05-29 NOTE — ED Triage Notes (Signed)
 Patient to ED via POV for right leg wound. PT reports she hit it about 1 month ago but then decided to drain area. Over the past week, pt reports increase pain and redness.

## 2024-06-01 ENCOUNTER — Telehealth: Payer: Self-pay | Admitting: Cardiovascular Disease

## 2024-06-01 DIAGNOSIS — Z79899 Other long term (current) drug therapy: Secondary | ICD-10-CM

## 2024-06-01 DIAGNOSIS — I10 Essential (primary) hypertension: Secondary | ICD-10-CM

## 2024-06-01 DIAGNOSIS — I5032 Chronic diastolic (congestive) heart failure: Secondary | ICD-10-CM | POA: Diagnosis not present

## 2024-06-01 DIAGNOSIS — I480 Paroxysmal atrial fibrillation: Secondary | ICD-10-CM

## 2024-06-01 NOTE — Telephone Encounter (Signed)
 Reviewed chart and per Dr. Darron he did want to recheck her BMP in one week. She verbalized understanding and will have it done. No further needs.

## 2024-06-01 NOTE — Telephone Encounter (Signed)
 Patient recently had lab work in hospital and would like to know if she still needs any labs drawn for Dr. Darron. Please advise.

## 2024-06-02 ENCOUNTER — Ambulatory Visit (INDEPENDENT_AMBULATORY_CARE_PROVIDER_SITE_OTHER): Admitting: Family Medicine

## 2024-06-02 ENCOUNTER — Encounter: Payer: Self-pay | Admitting: Family Medicine

## 2024-06-02 VITALS — BP 126/80 | HR 89 | Temp 98.6°F | Ht 64.0 in | Wt 173.4 lb

## 2024-06-02 DIAGNOSIS — S81802A Unspecified open wound, left lower leg, initial encounter: Secondary | ICD-10-CM | POA: Insufficient documentation

## 2024-06-02 DIAGNOSIS — L03115 Cellulitis of right lower limb: Secondary | ICD-10-CM | POA: Diagnosis not present

## 2024-06-02 DIAGNOSIS — S81801D Unspecified open wound, right lower leg, subsequent encounter: Secondary | ICD-10-CM | POA: Diagnosis not present

## 2024-06-02 LAB — BASIC METABOLIC PANEL WITH GFR
BUN/Creatinine Ratio: 24 (ref 12–28)
BUN: 20 mg/dL (ref 8–27)
CO2: 19 mmol/L — ABNORMAL LOW (ref 20–29)
Calcium: 9.5 mg/dL (ref 8.7–10.3)
Chloride: 102 mmol/L (ref 96–106)
Creatinine, Ser: 0.84 mg/dL (ref 0.57–1.00)
Glucose: 86 mg/dL (ref 70–99)
Potassium: 5.1 mmol/L (ref 3.5–5.2)
Sodium: 137 mmol/L (ref 134–144)
eGFR: 67 mL/min/1.73 (ref >59)

## 2024-06-02 NOTE — Assessment & Plan Note (Signed)
 Acute, significant swelling in left lower leg causing wound to weep.  No current associated infection but encouraged her to change bandage daily/absorbable pad and elevate legs.  Also encouraged to restart wearing compression hose.  She will follow-up for reassessment of both wounds in 1 week with PCP.

## 2024-06-02 NOTE — Assessment & Plan Note (Signed)
 Acute, significant improvement on day 4 of clindamycin .  Encouraged her to complete the course.  Encouraged her to wash wounds with antibiotic soap/warm water. Apply topical antibiotic ointment and absorbable pad with Kerlix/Coban.

## 2024-06-02 NOTE — Assessment & Plan Note (Signed)
 Acute, wound healing well with no current associated infection.  As soon as antibiotics are completed start wearing compression hose to treat peripheral swelling which can slow healing.  Also encouraged her to stop using daily peroxide.

## 2024-06-02 NOTE — Progress Notes (Signed)
 Patient ID: Tammy Manning, female    DOB: 1935/09/05, 89 y.o.   MRN: [id number]  This visit was conducted in person.  BP 126/80   Pulse 89   Temp 98.6 F (37 C) (Temporal)   Ht 5' 4 (1.626 m)   Wt 173 lb 6 oz (78.6 kg)   SpO2 98%   BMI 29.76 kg/m    CC:  Chief Complaint  Patient presents with   Follow-up    Right Leg Wound    Subjective:   HPI: Tammy Manning is a 89 y.o. female patient of Dr. Graham with history of atrial fibrillation on Eliquis , CHF presenting on 06/02/2024 for Follow-up (Right Leg Wound)  Several months ago she had a blister on her right leg, opened this on her own at home but since then she ha had associated redness.  She was seen in the ED on July 7 for leg pain and redness No fever or chills She was started on clindamycin  300 mg 3 times daily x 5 days. White blood cells, complete metabolic panel and lactic acid were within normal range..  Today she returns for follow-up. She reports she has less pai and decreased redness at the site. Small amount of discharge.  Changed dressing once at home  on 2 days ago.   She now reports new skin injury hitting car when getting in with left anterior lower shin.  She has been applying a Band-Aid but given peripheral swelling it is weeping clear liquid.   No fever.  No flu like symptoms.     On lasix  20 mg daily for CHF.  She has been elevating legs.  Relevant past medical, surgical, family and social history reviewed and updated as indicated. Interim medical history since our last visit reviewed. Allergies and medications reviewed and updated. Outpatient Medications Prior to Visit  Medication Sig Dispense Refill   atorvastatin  (LIPITOR) 10 MG tablet Take 1 tablet (10 mg total) by mouth daily. 90 tablet 3   Calcium  Carbonate-Vitamin D  (CALCIUM  600+D PO) Take 1 tablet by mouth daily.      clindamycin  (CLEOCIN ) 300 MG capsule Take 1 capsule (300 mg total) by mouth 3 (three) times daily for 5 days. 15  capsule 0   clobetasol  ointment (TEMOVATE ) 0.05 % Apply 1 application topically 2 (two) times daily as needed.     clorazepate  (TRANXENE ) 7.5 MG tablet TAKE ONE TABLET BY MOUTH ONCE DAILY AS NEEDED 30 tablet 0   cyanocobalamin  (,VITAMIN B-12,) 1000 MCG/ML injection Inject 1,000 mcg into the muscle every 30 (thirty) days.      diltiazem  (CARDIZEM  CD) 180 MG 24 hr capsule TAKE 1 CAPSULE BY MOUTH EVERY DAY 90 capsule 3   ELIQUIS  5 MG TABS tablet TAKE 1 TABLET BY MOUTH TWICE DAILY 180 tablet 1   famotidine  (PEPCID ) 20 MG tablet Take 20 mg by mouth 2 (two) times daily as needed for heartburn or indigestion.     furosemide  (LASIX ) 20 MG tablet Take 1 tablet (20 mg total) by mouth daily. 90 tablet 0   guaiFENesin (MUCINEX) 600 MG 12 hr tablet Take 600 mg by mouth 2 (two) times daily as needed.     hydrocortisone  (ANUSOL -HC) 25 MG suppository Place 1 suppository (25 mg total) rectally 2 (two) times daily. 10 suppository 0   irbesartan  (AVAPRO ) 300 MG tablet Take 150 mg by mouth daily.     loperamide  (IMODIUM ) 2 MG capsule Take 2 mg by mouth as needed for diarrhea or loose stools.  methotrexate  250 MG/10ML injection INJECT 0.6 MLS INTO THE SKIN ONCE A WEEK 4 mL 2   triamcinolone  (NASACORT ) 55 MCG/ACT AERO nasal inhaler Place 2 sprays into the nose daily. 1 each 12   Tuberculin-Allergy  Syringes 27G X 1/2 1 ML MISC Use 1 syringe once weekly to inject methotrexate . 25 each 1   estradiol  (ESTRACE ) 0.1 MG/GM vaginal cream Apply a pea-sized amount to fingertip and wipe in vaginal introitus twice weekly (Patient not taking: Reported on 05/25/2024) 42.5 g 1   hydrocortisone  (ANUSOL -HC) 2.5 % rectal cream Apply 1 Application topically at bedtime. To affected area for 10 days (Patient not taking: Reported on 05/25/2024) 30 g 0   irbesartan  (AVAPRO ) 300 MG tablet TAKE 1 TABLET BY MOUTH DAILY (Patient taking differently: Take 150 mg by mouth daily.) 90 tablet 2   nystatin  (MYCOSTATIN ) 100000 UNIT/ML suspension Take 5  mLs (500,000 Units total) by mouth 3 (three) times daily. Swish and swallow (Patient not taking: Reported on 05/25/2024) 120 mL 1   No facility-administered medications prior to visit.     Per HPI unless specifically indicated in ROS section below Review of Systems  Constitutional:  Negative for fatigue and fever.  HENT:  Negative for congestion.   Eyes:  Negative for pain.  Respiratory:  Negative for cough and shortness of breath.   Cardiovascular:  Positive for leg swelling. Negative for chest pain and palpitations.  Gastrointestinal:  Negative for abdominal pain.  Genitourinary:  Negative for dysuria and vaginal bleeding.  Musculoskeletal:  Negative for back pain.  Neurological:  Negative for syncope, light-headedness and headaches.  Psychiatric/Behavioral:  Negative for dysphoric mood.    Objective:  BP 126/80   Pulse 89   Temp 98.6 F (37 C) (Temporal)   Ht 5' 4 (1.626 m)   Wt 173 lb 6 oz (78.6 kg)   SpO2 98%   BMI 29.76 kg/m   Wt Readings from Last 3 Encounters:  06/02/24 173 lb 6 oz (78.6 kg)  05/29/24 168 lb (76.2 kg)  05/25/24 172 lb 4 oz (78.1 kg)      Physical Exam Constitutional:      General: She is not in acute distress.    Appearance: Normal appearance. She is well-developed. She is not ill-appearing or toxic-appearing.  HENT:     Head: Normocephalic.     Right Ear: Hearing, tympanic membrane, ear canal and external ear normal. Tympanic membrane is not erythematous, retracted or bulging.     Left Ear: Hearing, tympanic membrane, ear canal and external ear normal. Tympanic membrane is not erythematous, retracted or bulging.     Nose: No mucosal edema or rhinorrhea.     Right Sinus: No maxillary sinus tenderness or frontal sinus tenderness.     Left Sinus: No maxillary sinus tenderness or frontal sinus tenderness.     Mouth/Throat:     Pharynx: Uvula midline.  Eyes:     General: Lids are normal. Lids are everted, no foreign bodies appreciated.      Conjunctiva/sclera: Conjunctivae normal.     Pupils: Pupils are equal, round, and reactive to light.  Neck:     Thyroid : No thyroid  mass or thyromegaly.     Vascular: No carotid bruit.     Trachea: Trachea normal.  Cardiovascular:     Rate and Rhythm: Normal rate and regular rhythm.     Pulses: Normal pulses.     Heart sounds: Normal heart sounds, S1 normal and S2 normal. No murmur heard.    No  friction rub. No gallop.  Pulmonary:     Effort: Pulmonary effort is normal. No tachypnea or respiratory distress.     Breath sounds: Normal breath sounds. No decreased breath sounds, wheezing, rhonchi or rales.  Abdominal:     General: Bowel sounds are normal.     Palpations: Abdomen is soft.     Tenderness: There is no abdominal tenderness.  Musculoskeletal:     Cervical back: Normal range of motion and neck supple.     Right lower leg: 1+ Edema present.     Left lower leg: 2+ Edema present.  Skin:    General: Skin is warm and dry.     Findings: Lesion present. No rash.      Neurological:     Mental Status: She is alert.  Psychiatric:        Mood and Affect: Mood is not anxious or depressed.        Speech: Speech normal.        Behavior: Behavior normal. Behavior is cooperative.        Thought Content: Thought content normal.        Judgment: Judgment normal.       Results for orders placed or performed during the hospital encounter of 05/29/24  Lactic acid, plasma   Collection Time: 05/29/24 12:28 PM  Result Value Ref Range   Lactic Acid, Venous 0.8 0.5 - 1.9 mmol/L  Comprehensive metabolic panel   Collection Time: 05/29/24 12:28 PM  Result Value Ref Range   Sodium 137 135 - 145 mmol/L   Potassium 3.8 3.5 - 5.1 mmol/L   Chloride 103 98 - 111 mmol/L   CO2 24 22 - 32 mmol/L   Glucose, Bld 91 70 - 99 mg/dL   BUN 20 8 - 23 mg/dL   Creatinine, Ser 9.15 0.44 - 1.00 mg/dL   Calcium  9.3 8.9 - 10.3 mg/dL   Total Protein 6.5 6.5 - 8.1 g/dL   Albumin 3.6 3.5 - 5.0 g/dL   AST 18  15 - 41 U/L   ALT 15 0 - 44 U/L   Alkaline Phosphatase 41 38 - 126 U/L   Total Bilirubin 0.4 0.0 - 1.2 mg/dL   GFR, Estimated >39 >39 mL/min   Anion gap 10 5 - 15  CBC with Differential   Collection Time: 05/29/24 12:28 PM  Result Value Ref Range   WBC 8.8 4.0 - 10.5 K/uL   RBC 3.22 (L) 3.87 - 5.11 MIL/uL   Hemoglobin 10.9 (L) 12.0 - 15.0 g/dL   HCT 65.9 (L) 63.9 - 53.9 %   MCV 105.6 (H) 80.0 - 100.0 fL   MCH 33.9 26.0 - 34.0 pg   MCHC 32.1 30.0 - 36.0 g/dL   RDW 84.8 88.4 - 84.4 %   Platelets 463 (H) 150 - 400 K/uL   nRBC 0.0 0.0 - 0.2 %   Neutrophils Relative % 69 %   Neutro Abs 6.2 1.7 - 7.7 K/uL   Lymphocytes Relative 22 %   Lymphs Abs 2.0 0.7 - 4.0 K/uL   Monocytes Relative 6 %   Monocytes Absolute 0.5 0.1 - 1.0 K/uL   Eosinophils Relative 1 %   Eosinophils Absolute 0.0 0.0 - 0.5 K/uL   Basophils Relative 1 %   Basophils Absolute 0.0 0.0 - 0.1 K/uL   Immature Granulocytes 1 %   Abs Immature Granulocytes 0.05 0.00 - 0.07 K/uL    Assessment and Plan  Cellulitis of right leg Assessment & Plan: Acute,  significant improvement on day 4 of clindamycin .  Encouraged her to complete the course.  Encouraged her to wash wounds with antibiotic soap/warm water. Apply topical antibiotic ointment and absorbable pad with Kerlix/Coban.     Wound of right leg, subsequent encounter Assessment & Plan: Acute, wound healing well with no current associated infection.  As soon as antibiotics are completed start wearing compression hose to treat peripheral swelling which can slow healing.  Also encouraged her to stop using daily peroxide.   Wound of left leg, initial encounter Assessment & Plan: Acute, significant swelling in left lower leg causing wound to weep.  No current associated infection but encouraged her to change bandage daily/absorbable pad and elevate legs.  Also encouraged to restart wearing compression hose.  She will follow-up for reassessment of both wounds in 1 week  with PCP.     Return in about 1 week (around 06/09/2024) for  follow up  with PCP [name].    Greig Ring, MD

## 2024-06-07 ENCOUNTER — Ambulatory Visit (INDEPENDENT_AMBULATORY_CARE_PROVIDER_SITE_OTHER): Admitting: Family Medicine

## 2024-06-07 ENCOUNTER — Encounter: Payer: Self-pay | Admitting: Family Medicine

## 2024-06-07 VITALS — BP 130/68 | HR 78 | Temp 98.0°F | Ht 64.0 in | Wt 171.5 lb

## 2024-06-07 DIAGNOSIS — N1831 Chronic kidney disease, stage 3a: Secondary | ICD-10-CM

## 2024-06-07 DIAGNOSIS — N390 Urinary tract infection, site not specified: Secondary | ICD-10-CM

## 2024-06-07 DIAGNOSIS — M25471 Effusion, right ankle: Secondary | ICD-10-CM | POA: Diagnosis not present

## 2024-06-07 DIAGNOSIS — R350 Frequency of micturition: Secondary | ICD-10-CM | POA: Diagnosis not present

## 2024-06-07 DIAGNOSIS — S81801D Unspecified open wound, right lower leg, subsequent encounter: Secondary | ICD-10-CM | POA: Diagnosis not present

## 2024-06-07 DIAGNOSIS — N3000 Acute cystitis without hematuria: Secondary | ICD-10-CM

## 2024-06-07 DIAGNOSIS — M25472 Effusion, left ankle: Secondary | ICD-10-CM

## 2024-06-07 DIAGNOSIS — L03115 Cellulitis of right lower limb: Secondary | ICD-10-CM

## 2024-06-07 LAB — POC URINALSYSI DIPSTICK (AUTOMATED)
Bilirubin, UA: NEGATIVE
Blood, UA: 50
Glucose, UA: NEGATIVE
Ketones, UA: NEGATIVE
Nitrite, UA: POSITIVE — AB
Protein, UA: NEGATIVE
Spec Grav, UA: <=1.005 — AB (ref 1.010–1.025)
Urobilinogen, UA: 0.2 U/dL
pH, UA: 6 (ref 5.0–8.0)

## 2024-06-07 MED ORDER — CEPHALEXIN 500 MG PO CAPS: 500.0000 mg | ORAL_CAPSULE | Freq: Two times a day (BID) | ORAL | 0 refills | Status: DC

## 2024-06-07 NOTE — Patient Instructions (Signed)
 Keep wounds clean with soap and water  Pat try Use triple antibiotic ointment   Try to avoid trauma   Watch for redness/swelling/pain - signs of infection   Take keflex  for suspected uti  We will reach out when urine culture returns  If urinary symptoms worsen while waiting- let us  know
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
## 2024-06-07 NOTE — Assessment & Plan Note (Signed)
 Multifactorial  Venous insufficiency  Also diastolic dysfunction  Reviewed last cardiology note   Improved today  Has had several wounds and cellulitis-with improvement today  Discussed wound care Encouraged use of her supp hose when able to get them on

## 2024-06-07 NOTE — Assessment & Plan Note (Signed)
 Wound on shin improved (scant clear drainage from baseline edema)  Newer wound on calf -superficial and not infected looking   Discussed wound care -see AVS Get back to supp hose when physically able  Call back and Er precautions noted in detail today  (reviewed signs and symptoms of infection)

## 2024-06-07 NOTE — Assessment & Plan Note (Signed)
 Resolved after course of clindamycin   Scab present-will heal by 2ndary intention   Continue local care /soap water  Call back and Er precautions noted in detail today  -redness/pain/swelling

## 2024-06-07 NOTE — Assessment & Plan Note (Signed)
 Positive urinalysis with frequency and cloudy urine  Culture ordered  Will start keflex  in the meantime pending result (tolerates this in the past)  Last 2 culture were negative   Encouraged to call if symptoms worsen in meantime   Has ckd  Last gfr was good however / 67 Just finished course of clindamycin  for wound

## 2024-06-07 NOTE — Assessment & Plan Note (Signed)
 Currently signs and symptoms of acute cystitis Pending culture No prophylaxis currently

## 2024-06-07 NOTE — Assessment & Plan Note (Signed)
 Improved on last draw with GFR of 67 Reassuring

## 2024-06-09 ENCOUNTER — Ambulatory Visit: Payer: Self-pay | Admitting: Family Medicine

## 2024-06-09 LAB — URINE CULTURE
MICRO NUMBER:: 16706617
SPECIMEN QUALITY:: ADEQUATE

## 2024-06-14 ENCOUNTER — Other Ambulatory Visit: Payer: Self-pay | Admitting: Internal Medicine

## 2024-06-14 NOTE — Telephone Encounter (Signed)
 Last Fill: 01/27/2024  Labs: 05/29/2024 RBC 3.22 Hemoglobin 10.9 HCT 34.0 MCV 105.6 Platelets 463 CMP WNL  Next Visit: 07/12/2024  Last Visit: 01/11/2024  DX: Seropositive RA   Current Dose per office note 2/18/205: methotrexate  0.6 mL (15mg )  Okay to refill Methotrexate ?

## 2024-06-23 DIAGNOSIS — M170 Bilateral primary osteoarthritis of knee: Secondary | ICD-10-CM | POA: Diagnosis not present

## 2024-06-29 ENCOUNTER — Encounter: Payer: Self-pay | Admitting: Family Medicine

## 2024-06-29 MED ORDER — IRBESARTAN 150 MG PO TABS: 150.0000 mg | ORAL_TABLET | Freq: Every day | ORAL | 2 refills | Status: AC

## 2024-06-29 NOTE — Progress Notes (Signed)
 Office Visit Note  Patient: Tammy Manning             Date of Birth: [birth year]           MRN: [id number]             PCP: Randeen Laine LABOR, MD Referring: [name], Laine LABOR, MD Visit Date: 07/12/2024   Subjective:  Follow-up  Discussed the use of AI scribe software for clinical note transcription with the patient, who gave verbal consent to proceed.  History of Present Illness   Tammy Manning is a 89 y.o. female here for follow up  for seropositive RA on methotrexate  15 mg subcu weekly.   She is accompanied by her husband.  She has not experienced any major flare-ups of her arthritis recently, describing her symptoms as mild with occasional episodes that resolve in a couple of days. She is not taking folic acid  supplements.  She mentions a fall that occurred since her last visit, where she 'fell and skinned everything off' while trying to bring her husband a sandwich. This incident, along with her husband's hospitalizations, contributed to her delay in returning for a follow-up. She has not had any other falls recently.  She has a history of bruising easily, which she attributes to her use of Eliquis . She also mentions doing her own housework, which sometimes leads to bumps and bruises.  Regarding her knees, she reports that they are not giving her much trouble and are managed by Dr. Josefina. She received injections in her knees two weeks ago, which have helped maintain their condition.       Previous HPI 01/11/2024 Tammy Manning is a 89 y.o. female here for follow up for seropositive RA on methotrexate  15 mg subcu weekly.     She is currently on methotrexate  0.6 mL (15 mg) for rheumatoid arthritis, which effectively manages her symptoms, particularly shoulder pain. However, she dislikes the medication due to hair loss and is interested in potentially reducing the dosage.   She has a persistent urinary tract infection and is on a continuous antibiotic regimen with trimethoprim .  Despite this, she reports no significant issues with the antibiotic treatment.   She recently had a sinus infection treated with a heavy antibiotic, which has since resolved. No further treatment is necessary at this time.   She sustained a bruise and skin abrasion on her leg from a cart accident. She managed the wound herself and notes that it is healing without signs of infection. She is on Eliquis , which contributes to bruising.   She takes B12 shots monthly and uses Centrum after fifty vitamins, which do not contain iron, as recommended by her daughter who had breast cancer. She has been unable to take folic acid  due to previous issues.     Previous HPI 10/08/2023 The patient, an 89 year old with a history of chronic urinary tract infections (UTIs), rheumatoid arthritis, and atrial fibrillation, presents for follow-up for seropositive RA on methotrexate  15 mg subcu weekly. They report a recent bout of COVID-19, which was slow to resolve and required them to temporarily discontinue methotrexate . During this period, they experienced a resurgence of joint pain, particularly in the shoulder. Upon resuming methotrexate , the patient reports significant improvement in their symptoms.   She remains on indefinite antibiotics for chronic UTI. They are currently on a daily low-dose antibiotic regimen for maintenance.   The patient also mentions some joint pain in the right hand and shoulder, which was more pronounced when they were off  methotrexate  during their COVID-19 illness. They report minimal morning stiffness and are able to maintain their daily routine, including walking their dog.  She had recent repeat knee injections with her orthopedic clinic for chronic osteoarthritis pain.   Recent CBC and CMP checked in October WBC mildly elevated 11.4 metabolic panel was normal.   Previous HPI 07/07/2023 Tammy Manning is a 89 y.o. female here for follow up for seropositive RA on methotrexate  15 mg  subcu weekly.  Joint symptoms have been pretty stable without serious exacerbation or swelling. She has pain in both hips and had a steroid injection on the right side about one month ago with a good improvement. Left side is bothering her currently but not as badly as sometimes. Noticeable both at night and when walking.  Has ongoing problems since the colitis with constipation sometimes for a week and then abdominal pain and diarrhea. Also with chronic UTI with frequency and incontinence but no painful urination or visible abnormality.   Previous HPI 03/30/2023 Tammy Manning is a 89 y.o. female here for follow up for seropositive RA on methotrexate  15 mg subcu weekly.  Not on folic acid  due to intolerance of oral supplement.  Overall arthritis symptoms are doing well.  She had recent bilateral knee steroid injections with Dr. Josefina with good improvement.  Currently no active complaint about the neck shoulder or hand pain.  She had an episode of colitis no specific causative infection or inflammatory process identified.  This has resolved but is now scheduled for colonoscopy on Thursday.     Previous HPI 12/30/22 Tammy Manning is a 89 y.o. female here for follow up for seropositive RA on methotrexate  15 mg subcu weekly and folic acid  1 mg daily.  Since her last visit she still had some ongoing pain most persistently in the neck and shoulders bilaterally but without any severe flareup compared to before starting treatment.  She has been experiencing more pain affecting her left knee where there is known severe osteoarthritis.  She started taking a lot of Tylenol  about 3 times every day 3 to 4 weeks ago but started to develop right-sided flank pain and diarrhea with yellow discolored stools.  She decreased to taking Tylenol  no more than once daily with improvement of the symptoms.  She saw Dr. Josefina with intra-articular steroid injection 2 weeks ago with a good improvement in symptoms.  She has not  had any significant interval infections.     Previous HPI 03/09/22 Tammy Manning is a 89 y.o. female here for follow up for seropositive RA on MTX 15 mg [place] weekly. She is doing very well since last visit joint pains almost all better except bilateral hips bothering her intermittently. Left knee also creaky but not much inflammation problem. She has persistent stomach irritation, not much relief with taking pepcid  as needed. [facility] able to [facility] methot[facility]ate  Rx from supply issue but she still had medication on hand. She saw urology recently with persistent abnormal UA and cultures but mostly asymptomatic.   Previous HPI 12/29/21 Tammy Manning is a 89 y.o. female here for follow up for RA after restarting methotrexate  at 15 mg [place] weekly, which had been delayed due to repeated interruptions from UTI infections and antibiotic treatment. She has noticed improvement in joint symptoms after about 2 weeks taking the methotrexate . She continues to have some nausea when she takes this despite subcutaneous route.   Previous HPI 11/25/20 Tammy Manning is a 89 y.o. female  with a history of afib, TIA, and OA here for evaluation of joint pain and positive rheumatoid factor. She has joint pain in multiple sites including both hands but also has a lot of pain with the right shoulder that started more acutely with suspected rotator cuff tear. 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 She does notice swelling in hands intermittently. She has morning stiffness lasting less than 30 minutes daily also persistent pain throughout the day. She has had left radius fracture with surgical repair but no other major surgery of her arms.   Review of Systems  Constitutional:  Positive for fatigue.  HENT:  Positive for mouth sores and mouth dryness.   Eyes:  Negative for dryness.  Respiratory:  Negative for shortness of breath.    Cardiovascular:  Negative for chest pain and palpitations.  Gastrointestinal:  Positive for diarrhea. Negative for blood in stool and constipation.  Endocrine: Positive for increased urination.  Genitourinary:  Negative for involuntary urination.  Musculoskeletal:  Positive for joint pain, joint pain and muscle tenderness. Negative for gait problem, joint swelling, myalgias, muscle weakness, morning stiffness and myalgias.  Skin:  Negative for color change, rash, hair loss and sensitivity to sunlight.  Allergic/Immunologic: Negative for susceptible to infections.  Neurological:  Positive for dizziness. Negative for headaches.  Hematological:  Negative for swollen glands.  Psychiatric/Behavioral:  Positive for sleep disturbance. Negative for depressed mood. The patient is not nervous/anxious.     PMFS History:  Patient Active Problem List   Diagnosis Date Noted   Cellulitis of right leg 06/02/2024   Wound of right leg, subsequent encounter 06/02/2024   Wound of left leg, initial encounter 06/02/2024   CKD (chronic kidney disease) stage 3, GFR 30-59 ml/min (HCC) 05/10/2024   Hyperkalemia 04/25/2024   Decreased GFR 04/25/2024   Ankle edema, bilateral 04/19/2024   Neck pain 03/02/2024   Pain of left scapula 02/23/2024   Rhomboid muscle pain 02/23/2024   Diabetes mellitus screening 01/27/2024   Thrush 01/27/2024   Anxious mood 09/20/2023   Tremor of both hands 09/20/2023   Head injury 04/28/2023   Superficial injury of skin 04/21/2023   Dysuria 04/12/2023   Diverticulosis 03/21/2023   Chronic diastolic heart failure (HCC) 03/13/2023   Obesity (BMI 30-39.9) 03/13/2023   PAF (paroxysmal atrial fibrillation) (HCC) 03/13/2023   Recurrent UTI (urinary tract infection) 02/18/2023   Intertrigo 02/16/2023   Hemorrhoids 02/02/2023   Urinary incontinence 02/02/2023   Acute cystitis 01/26/2023   Dilated aortic root (HCC) 01/26/2023   Osteopenia 01/26/2023   Multinodular goiter 02/27/2022    Encounter for screening mammogram for breast cancer 01/23/2022   High risk medication use 10/07/2021   Atrial fibrillation with RVR (HCC) 04/28/2021   Hypertrophic toenail 04/22/2021   Seropositive rheumatoid arthritis (HCC) 11/25/2020   Bilateral hand pain 11/25/2020   Bilateral shoulder pain 10/16/2020   Joint pain 10/16/2020   Estrogen deficiency 04/30/2020   Dry mouth 03/15/2020   Osteoarthritis of right hip 01/02/2020   Right thyroid  nodule 05/12/2018   History of TIA (transient ischemic attack) 05/09/2018   Aortic insufficiency 09/10/2015   Stress reaction 09/08/2015   Encounter for Medicare annual wellness exam 02/28/2013   Bucket-handle tear of lateral meniscus of left knee as current injury 12/30/2012   Episodic atrial fibrillation (HCC) 11/17/2012   Hearing loss of both ears 01/14/2012   B12 deficiency 03/17/2010   GERD (gastroesophageal reflux disease) 03/17/2010   POSTMENOPAUSAL STATUS  08/20/2008   Essential hypertension 07/19/2007   Hyperlipidemia 06/24/2007   IBS 06/24/2007   DERMATITIS, ATOPIC 06/24/2007   SKIN CANCER, HX OF 06/24/2007    Past Medical History:  Diagnosis Date   A-fib (HCC)    Anginal pain (HCC)    Aortic insufficiency    a. 02/2020 Echo: EF 60-65%, no rwma, Gr1 DD, nl RV fxn, mildly dil LA, triv AI, Asc Ao 39mm; b. 04/2021 Echo: EF 60-65%, no rwma, GrI DD, nl RV fxn, mildly dil LA, AI not visualized. Mild-mod AoV sclerosis w/o stenosis.   Arthritis    Knees   B12 deficiency    Mild   Bucket-handle tear of lateral meniscus of left knee as current injury 12/30/2012   Cancer (HCC)    Skin, basal cell on nose and eyelid   Colitis    COVID    DJD (degenerative joint disease)    Low back   Dyspepsia    GERD (gastroesophageal reflux disease)    Heart murmur    Heart palpitations    History of cardiac cath    a. 02/2017 Cath: Nl cors. Nl LV fxn. Abd Aogram w/o RAS.   HOH (hard of hearing)    Hyperlipidemia    Hypertension    IBS (irritable  bowel syndrome)    Lactose intolerance    Mixed incontinence    PONV (postoperative nausea and vomiting)    Urinary tract infection    Vulvar irritation    from urine pad, uses Triamcinolone  oint.    Family History  Problem Relation Age of Onset   Cancer Mother        Colon   Heart disease Father        CAD   Diabetes Sister    Hypothyroidism Sister    Diabetes Sister    Alzheimer's disease Sister    Diabetes Brother    Leukemia Brother    Esophageal cancer Brother    Diabetes Brother    Cancer Paternal Aunt        Breast   Breast cancer Daughter    Cancer Paternal Aunt    Cancer Brother    Diabetes Brother    Past Surgical History:  Procedure Laterality Date   ABDOMINAL AORTOGRAM N/A 03/15/2017   Procedure: Abdominal Aortogram;  Surgeon: Deatrice DELENA Cage, MD;  Location: [facility];  Service: Cardiovascular;  Laterality: N/A;   ABDOMINAL HYSTERECTOMY  1982   Large fibroids and bleeding   APPENDECTOMY     BILATERAL SALPINGOOPHORECTOMY  1986   BONE GRAFT HIP ILIAC CREST     To Left arm   BREAST REDUCTION SURGERY     BREAST SURGERY     CARDIAC CATHETERIZATION     CATARACT EXTRACTION W/PHACO Left 07/07/2016   Procedure: CATARACT EXTRACTION PHACO AND INTRAOCULAR LENS PLACEMENT (IOC);  Surgeon: Elsie Carmine, MD;  Location: [facility];  Service: Ophthalmology;  Laterality: Left;  US  01:10 AP% 18.3 CDE 12.91 Fluid pak lot # [id number]   CATARACT EXTRACTION W/PHACO Right 07/28/2016   Procedure: CATARACT EXTRACTION PHACO AND INTRAOCULAR LENS PLACEMENT (IOC);  Surgeon: Elsie Carmine, MD;  Location: [facility];  Service: Ophthalmology;  Laterality: Right;  US  01:26 AP% 18.5 CDE 16.12 Fluid pack lot # [id number]   COLONOSCOPY  07/2016   Dr. Kemper   COLONOSCOPY WITH PROPOFOL  N/A 04/01/2023   Procedure: COLONOSCOPY WITH PROPOFOL ;  Surgeon: Unk Corinn Skiff, MD;  Location: [facility];  Service: Gastroenterology;  Laterality: N/A;   DILATION AND CURETTAGE  OF UTERUS      miscarragesx2   ESOPHAGOGASTRODUODENOSCOPY     Polyps   HEMORRHOID SURGERY  2006   KNEE ARTHROSCOPY WITH LATERAL MENISECTOMY Left 12/30/2012   Procedure: KNEE ARTHROSCOPY WITH LATERAL MENISECTOMY;  Surgeon: Fonda SHAUNNA Olmsted, MD;  Location: [facility];  Service: Orthopedics;  Laterality: Left;  LEFT KNEE SCOPE LATERAL MENISCECTOMY   LEFT HEART CATH AND CORONARY ANGIOGRAPHY N/A 03/15/2017   Procedure: Left Heart Cath and Coronary Angiography;  Surgeon: Deatrice DELENA Cage, MD;  Location: [facility];  Service: Cardiovascular;  Laterality: N/A;   MOUTH SURGERY     skin cancer eyelid  2012   TONSILLECTOMY     Social History   Social History Narrative   Wellsite geologist.     Immunization History  Administered Date(s) Administered   Fluad Quad(high Dose 65+) 10/16/2020, 07/24/2022   INFLUENZA, HIGH DOSE SEASONAL PF 08/11/2016, 08/27/2017, 08/02/2018, 09/05/2021, 08/31/2023   Influenza Split 07/24/2011, 08/23/2014   Influenza Whole 08/23/2004, 07/24/2009, 08/06/2010, 08/05/2012   Influenza,inj,Quad PF,6+ Mos 08/16/2013, 08/07/2015   Influenza-Unspecified 09/05/2021   PFIZER(Purple Top)SARS-COV-2 Vaccination 12/15/2019, 01/05/2020, 10/25/2020   Pneumococcal Conjugate-13 08/24/2014   Pneumococcal Polysaccharide-23 08/20/2008   Respiratory Syncytial Virus Vaccine,Recomb Aduvanted(Arexvy) 12/30/2022   Td 03/20/2003, 02/28/2013, 04/21/2023   Tdap 03/15/2024   Zoster, Live 09/04/2015     Objective: Vital Signs: BP (!) 117/55 (BP Location: Left Arm, Patient Position: Sitting, Cuff Size: Normal)   Pulse 69   Resp 17   Ht 5' 4.5 (1.638 m)   Wt 169 lb 3.2 oz (76.7 kg)   BMI 28.59 kg/m    Physical Exam Eyes:     Conjunctiva/sclera: Conjunctivae normal.  Cardiovascular:     Rate and Rhythm: Normal rate and regular rhythm.  Pulmonary:     Effort: Pulmonary effort is normal.     Breath sounds: Normal breath sounds.  Lymphadenopathy:     Cervical: No cervical adenopathy.   Skin:    General: Skin is warm and dry.     Findings: Bruising (Forearms) present.  Neurological:     Mental Status: She is alert.  Psychiatric:        Mood and Affect: Mood normal.      Musculoskeletal Exam:  Neck full ROM no tenderness Shoulders full ROM no tenderness or swelling Elbows full ROM no tenderness or swelling Wrists full ROM no tenderness or swelling Fingers full ROM no tenderness or swelling, b/l heberdon's nodes Knees full ROM no tenderness or swelling Ankles full ROM no tenderness or swelling   Investigation: No additional findings.  Imaging: No results found.  Recent Labs: Lab Results  Component Value Date   WBC 8.8 05/29/2024   HGB 10.9 (L) 05/29/2024   PLT 463 (H) 05/29/2024   NA 137 06/01/2024   K 5.1 06/01/2024   CL 102 06/01/2024   CO2 19 (L) 06/01/2024   GLUCOSE 86 06/01/2024   BUN 20 06/01/2024   CREATININE 0.84 06/01/2024   BILITOT 0.4 05/29/2024   ALKPHOS 41 05/29/2024   AST 18 05/29/2024   ALT 15 05/29/2024   PROT 6.5 05/29/2024   ALBUMIN 3.6 05/29/2024   CALCIUM  9.5 06/01/2024   GFRAA 71 02/02/2020    Speciality Comments: No specialty comments available.  Procedures:  No procedures performed Allergies: Antihistamines, chlorpheniramine-type; Atenolol; Cefuroxime  axetil; Ciprofloxacin; Co q 10 [coenzyme q10]; Crestor  [rosuvastatin  calcium ]; Dextromethorphan-guaifenesin; Diflucan  [fluconazole ]; Elavil  [amitriptyline ]; Epinephrine ; Methenamine ; Ramipril; Sulfamethoxazole-trimethoprim ; and Vitamin d  analogs   Assessment / Plan:  Visit Diagnoses: Seropositive rheumatoid arthritis (HCC) - Plan: Tuberculin-Allergy  Syringes 27G X 1/2 1 ML MISC, methotrexate  50 MG/2ML injection Rheumatoid arthritis well-managed with methotrexate . No significant joint inflammation. Considering dose reduction due to age-related changes in drug metabolism. - Decrease methotrexate  dose to 0.5 mL (12.5 mg) [place] weekly.  High risk medication use -  methotrexate  15 mg subcu weekly. Intolerant to folic acid  supplement. Methotrexate  causing mild macrocytosis with MCV 105-107. No anemia or significant symptoms. Folic acid  supplementation not currently used.  Recent labs reviewed blood count metabolic panel from July at hospital visit otherwise appropriate for continued medication. - Educated on dietary sources of folic acid .   Dysuria - Sees [facility]. Long-term trimethoprim  100 mg daily   Orders: No orders of the defined types were placed in this encounter.  Meds ordered this encounter  Medications   Tuberculin-Allergy  Syringes 27G X 1/2 1 ML MISC    Sig: Use 1 syringe once weekly to inject methotrexate .    Dispense:  25 each    Refill:  1   methotrexate  50 MG/2ML injection    Sig: Inject 0.5 mLs (12.5 mg total) into the skin once a week.     Follow-Up Instructions: Return in about 3 months (around 10/12/2024) for RA on MTX decrease f/u 3mos.   Lonni LELON Ester, MD  Note - This record has been created using [employer].  Chart creation errors have been sought, but may not always  have been located. Such creation errors do not reflect on  the standard of medical care.

## 2024-07-12 ENCOUNTER — Ambulatory Visit: Attending: Internal Medicine | Admitting: Internal Medicine

## 2024-07-12 ENCOUNTER — Encounter: Payer: Self-pay | Admitting: Internal Medicine

## 2024-07-12 VITALS — BP 117/55 | HR 69 | Resp 17 | Ht 64.5 in | Wt 169.2 lb

## 2024-07-12 DIAGNOSIS — R300 Dysuria: Secondary | ICD-10-CM | POA: Diagnosis not present

## 2024-07-12 DIAGNOSIS — Z79899 Other long term (current) drug therapy: Secondary | ICD-10-CM | POA: Insufficient documentation

## 2024-07-12 DIAGNOSIS — M059 Rheumatoid arthritis with rheumatoid factor, unspecified: Secondary | ICD-10-CM | POA: Insufficient documentation

## 2024-07-12 MED ORDER — TUBERCULIN-ALLERGY SYRINGES 27G X 1/2" 1 ML MISC: 1 refills | Status: AC

## 2024-07-12 MED ORDER — METHOTREXATE SODIUM CHEMO INJECTION 50 MG/2ML: 12.5000 mg | INTRAMUSCULAR | Status: DC

## 2024-07-12 NOTE — Patient Instructions (Signed)
 I recommend decreasing methotrexate  to 12.5 mg (0.74mL) weekly injection dose. We can recheck labs at next follow up.

## 2024-07-21 DIAGNOSIS — Z79899 Other long term (current) drug therapy: Secondary | ICD-10-CM | POA: Diagnosis not present

## 2024-07-21 DIAGNOSIS — I480 Paroxysmal atrial fibrillation: Secondary | ICD-10-CM | POA: Diagnosis not present

## 2024-07-21 DIAGNOSIS — I10 Essential (primary) hypertension: Secondary | ICD-10-CM | POA: Diagnosis not present

## 2024-07-21 LAB — BASIC METABOLIC PANEL WITH GFR
BUN/Creatinine Ratio: 27 (ref 12–28)
BUN: 26 mg/dL (ref 8–27)
CO2: 19 mmol/L — ABNORMAL LOW (ref 20–29)
Calcium: 9.7 mg/dL (ref 8.7–10.3)
Chloride: 100 mmol/L (ref 96–106)
Creatinine, Ser: 0.97 mg/dL (ref 0.57–1.00)
Glucose: 85 mg/dL (ref 70–99)
Potassium: 5 mmol/L (ref 3.5–5.2)
Sodium: 136 mmol/L (ref 134–144)
eGFR: 57 mL/min/1.73 — ABNORMAL LOW (ref >59)

## 2024-07-24 ENCOUNTER — Encounter: Payer: Self-pay | Admitting: Family Medicine

## 2024-07-25 ENCOUNTER — Ambulatory Visit: Payer: Self-pay | Admitting: Cardiovascular Disease

## 2024-07-27 ENCOUNTER — Ambulatory Visit: Attending: Cardiovascular Disease | Admitting: Cardiovascular Disease

## 2024-07-27 ENCOUNTER — Encounter: Payer: Self-pay | Admitting: Cardiovascular Disease

## 2024-07-27 VITALS — BP 110/60 | HR 74 | Ht 64.5 in | Wt 165.2 lb

## 2024-07-27 DIAGNOSIS — E785 Hyperlipidemia, unspecified: Secondary | ICD-10-CM | POA: Insufficient documentation

## 2024-07-27 DIAGNOSIS — I480 Paroxysmal atrial fibrillation: Secondary | ICD-10-CM | POA: Diagnosis not present

## 2024-07-27 DIAGNOSIS — I359 Nonrheumatic aortic valve disorder, unspecified: Secondary | ICD-10-CM | POA: Insufficient documentation

## 2024-07-27 DIAGNOSIS — I10 Essential (primary) hypertension: Secondary | ICD-10-CM | POA: Insufficient documentation

## 2024-07-27 DIAGNOSIS — I872 Venous insufficiency (chronic) (peripheral): Secondary | ICD-10-CM | POA: Insufficient documentation

## 2024-07-27 NOTE — Progress Notes (Signed)
 Cardiology Office Note   Date:  07/27/2024   ID:  Tammy Manning, DOB [birth year], MRN [id number]  PCP:  Randeen Laine LABOR, MD  Cardiologist:  Deatrice Cage, MD   Chief Complaint  Patient presents with   Follow-up    2 month f/u no complaints today. Meds reviewed verbally with pt.       History of Present Illness: Tammy Manning is a 89 y.o. female who presents for a follow-up visit regarding paroxysmal atrial fibrillation.  She has intolerance to multiple antihypertensive medications.  Cardiac catheterization in April 2018 showed normal coronary arteries and normal LV systolic function.  Abdominal aortogram showed no evidence of renal artery stenosis.    Previous echocardiogram in June 2019 showed an EF of 55 - 60%, mild LVH and mild aortic insufficiency.    She was hospitalized in June of 2022 for atrial fibrillation.  She took an extra dose of chlorthalidone  for leg edema and presented with increased fatigue and palpitations.  She was noted to be in atrial fibrillation with RVR.  She was started on diltiazem  drip and converted to sinus rhythm.  She was noted to be mildly hypokalemic. The patient was switched from chlorthalidone  to diltiazem .  CHA2DS2-VASc score was 4 and thus she was started on anticoagulation with Eliquis  5 mg twice daily.  She had COVID in September, 2024 and felt significant shortness of breath and fatigue after that.  Echocardiogram in November showed normal LV systolic function with mildly calcified aortic valve without significant stenosis and mild aortic insufficiency.  She was seen last month for worsening lower extremity edema.  She was suspected of having a component of chronic venous insufficiency.  I advised her to elevate her legs and use support stockings.  In addition, I added furosemide .  She had an emergency room visit shortly after that for leg wound and was prescribed clindamycin . Her lower extremity edema resolved and she feels significantly  better.  No palpitations.   Past Medical History:  Diagnosis Date   A-fib (HCC)    Anginal pain (HCC)    Aortic insufficiency    a. 02/2020 Echo: EF 60-65%, no rwma, Gr1 DD, nl RV fxn, mildly dil LA, triv AI, Asc Ao 39mm; b. 04/2021 Echo: EF 60-65%, no rwma, GrI DD, nl RV fxn, mildly dil LA, AI not visualized. Mild-mod AoV sclerosis w/o stenosis.   Arthritis    Knees   B12 deficiency    Mild   Bucket-handle tear of lateral meniscus of left knee as current injury 12/30/2012   Cancer (HCC)    Skin, basal cell on nose and eyelid   Colitis    COVID    DJD (degenerative joint disease)    Low back   Dyspepsia    GERD (gastroesophageal reflux disease)    Heart murmur    Heart palpitations    History of cardiac cath    a. 02/2017 Cath: Nl cors. Nl LV fxn. Abd Aogram w/o RAS.   HOH (hard of hearing)    Hyperlipidemia    Hypertension    IBS (irritable bowel syndrome)    Lactose intolerance    Mixed incontinence    PONV (postoperative nausea and vomiting)    Urinary tract infection    Vulvar irritation    from urine pad, uses Triamcinolone  oint.    Past Surgical History:  Procedure Laterality Date   ABDOMINAL AORTOGRAM N/A 03/15/2017   Procedure: Abdominal Aortogram;  Surgeon: Deatrice LABOR Cage, MD;  Location: [facility];  Service: Cardiovascular;  Laterality: N/A;   ABDOMINAL HYSTERECTOMY  1982   Large fibroids and bleeding   APPENDECTOMY     BILATERAL SALPINGOOPHORECTOMY  1986   BONE GRAFT HIP ILIAC CREST     To Left arm   BREAST REDUCTION SURGERY     BREAST SURGERY     CARDIAC CATHETERIZATION     CATARACT EXTRACTION W/PHACO Left 07/07/2016   Procedure: CATARACT EXTRACTION PHACO AND INTRAOCULAR LENS PLACEMENT (IOC);  Surgeon: Elsie Carmine, MD;  Location: [facility];  Service: Ophthalmology;  Laterality: Left;  US  01:10 AP% 18.3 CDE 12.91 Fluid pak lot # [id number]   CATARACT EXTRACTION W/PHACO Right 07/28/2016   Procedure: CATARACT EXTRACTION PHACO AND INTRAOCULAR  LENS PLACEMENT (IOC);  Surgeon: Elsie Carmine, MD;  Location: [facility];  Service: Ophthalmology;  Laterality: Right;  US  01:26 AP% 18.5 CDE 16.12 Fluid pack lot # [id number]   COLONOSCOPY  07/2016   Dr. Kemper   COLONOSCOPY WITH PROPOFOL  N/A 04/01/2023   Procedure: COLONOSCOPY WITH PROPOFOL ;  Surgeon: Unk Corinn Skiff, MD;  Location: [facility];  Service: Gastroenterology;  Laterality: N/A;   DILATION AND CURETTAGE OF UTERUS     miscarragesx2   ESOPHAGOGASTRODUODENOSCOPY     Polyps   HEMORRHOID SURGERY  2006   KNEE ARTHROSCOPY WITH LATERAL MENISECTOMY Left 12/30/2012   Procedure: KNEE ARTHROSCOPY WITH LATERAL MENISECTOMY;  Surgeon: Fonda SHAUNNA Olmsted, MD;  Location: [facility];  Service: Orthopedics;  Laterality: Left;  LEFT KNEE SCOPE LATERAL MENISCECTOMY   LEFT HEART CATH AND CORONARY ANGIOGRAPHY N/A 03/15/2017   Procedure: Left Heart Cath and Coronary Angiography;  Surgeon: Deatrice DELENA Cage, MD;  Location: [facility];  Service: Cardiovascular;  Laterality: N/A;   MOUTH SURGERY     skin cancer eyelid  2012   TONSILLECTOMY       Current Outpatient Medications  Medication Sig Dispense Refill   atorvastatin  (LIPITOR) 10 MG tablet Take 1 tablet (10 mg total) by mouth daily. 90 tablet 3   Calcium  Carbonate-Vitamin D  (CALCIUM  600+D PO) Take 1 tablet by mouth daily.      clobetasol  ointment (TEMOVATE ) 0.05 % Apply 1 application topically 2 (two) times daily as needed.     clorazepate  (TRANXENE ) 7.5 MG tablet TAKE ONE TABLET BY MOUTH ONCE DAILY AS NEEDED 30 tablet 0   cyanocobalamin  (,VITAMIN B-12,) 1000 MCG/ML injection Inject 1,000 mcg into the muscle every 30 (thirty) days.      diltiazem  (CARDIZEM  CD) 180 MG 24 hr capsule TAKE 1 CAPSULE BY MOUTH EVERY DAY 90 capsule 3   ELIQUIS  5 MG TABS tablet TAKE 1 TABLET BY MOUTH TWICE DAILY 180 tablet 1   famotidine  (PEPCID ) 20 MG tablet Take 20 mg by mouth 2 (two) times daily as needed for heartburn or indigestion.      furosemide  (LASIX ) 20 MG tablet Take 1 tablet (20 mg total) by mouth daily. 90 tablet 0   guaiFENesin (MUCINEX) 600 MG 12 hr tablet Take 600 mg by mouth 2 (two) times daily as needed.     irbesartan  (AVAPRO ) 150 MG tablet Take 1 tablet (150 mg total) by mouth daily. 90 tablet 2   loperamide  (IMODIUM ) 2 MG capsule Take 2 mg by mouth as needed for diarrhea or loose stools.     methotrexate  50 MG/2ML injection Inject 0.5 mLs (12.5 mg total) into the skin once a week.     triamcinolone  (NASACORT ) 55 MCG/ACT AERO nasal inhaler Place 2 sprays into the nose  daily. 1 each 12   trimethoprim  (TRIMPEX ) 100 MG tablet Take 100 mg by mouth daily.     Tuberculin-Allergy  Syringes 27G X 1/2 1 ML MISC Use 1 syringe once weekly to inject methotrexate . 25 each 1   cephALEXin  (KEFLEX ) 500 MG capsule Take 1 capsule (500 mg total) by mouth 2 (two) times daily. (Patient not taking: Reported on 07/27/2024) 10 capsule 0   hydrocortisone  (ANUSOL -HC) 25 MG suppository Place 1 suppository (25 mg total) rectally 2 (two) times daily. (Patient not taking: Reported on 07/27/2024) 10 suppository 0   No current facility-administered medications for this visit.    Allergies:   Antihistamines, chlorpheniramine-type; Atenolol; Cefuroxime  axetil; Ciprofloxacin; Co q 10 [coenzyme q10]; Crestor  [rosuvastatin  calcium ]; Dextromethorphan-guaifenesin; Diflucan  [fluconazole ]; Elavil  [amitriptyline ]; Epinephrine ; Methenamine ; Ramipril; Sulfamethoxazole-trimethoprim ; and Vitamin d  analogs    Social History:  The patient  reports that she quit smoking about 60 years ago. Her smoking use included cigarettes. She started smoking about 72 years ago. She has a 12 pack-year smoking history. She has never been exposed to tobacco smoke. She has never used smokeless tobacco. She reports that she does not drink alcohol and does not use drugs.   Family History:  The patient's family history includes Alzheimer's disease in her sister; Breast cancer in her  daughter; Cancer in her brother, mother, paternal aunt, and paternal aunt; Diabetes in her brother, brother, brother, sister, and sister; Esophageal cancer in her brother; Heart disease in her father; Hypothyroidism in her sister; Leukemia in her brother.    ROS:  Please see the history of present illness.   Otherwise, review of systems are positive for none.   All other systems are reviewed and negative.    PHYSICAL EXAM: VS:  BP 110/60 (BP Location: Left Arm, Patient Position: Sitting, Cuff Size: Normal)   Pulse 74   Ht 5' 4.5 (1.638 m)   Wt 165 lb 4 oz (75 kg)   SpO2 97%   BMI 27.93 kg/m  , BMI Body mass index is 27.93 kg/m. GEN: Well nourished, well developed, in no acute distress  HEENT: normal  Neck: no JVD, carotid bruits, or masses Cardiac: RRR; no  rubs, or gallops, 2/6 systolic murmur in the aortic area.  Trace bilateral lower extremity edema. Respiratory:  clear to auscultation bilaterally, normal work of breathing GI: soft, nontender, nondistended, + BS MS: no deformity or atrophy  Skin: warm and dry, no rash Neuro:  Strength and sensation are intact Psych: euthymic mood, full affect   EKG:  EKG is ordered today. The ekg ordered today demonstrates : Normal sinus rhythm Minimal voltage criteria for LVH, may be normal variant ( R in aVL ) When compared with ECG of 25-May-2024 08:43, No significant change was found    Recent Labs: 01/27/2024: TSH 0.85 04/19/2024: Pro B Natriuretic peptide (BNP) 77.0 05/29/2024: ALT 15; Hemoglobin 10.9; Platelets 463 07/21/2024: BUN 26; Creatinine, Ser 0.97; Potassium 5.0; Sodium 136    Lipid Panel    Component Value Date/Time   CHOL 229 (H) 01/27/2024 1135   CHOL 196 02/02/2020 1348   TRIG 150.0 (H) 01/27/2024 1135   HDL 88.30 01/27/2024 1135   HDL 80 02/02/2020 1348   CHOLHDL 3 01/27/2024 1135   VLDL 30.0 01/27/2024 1135   LDLCALC 111 (H) 01/27/2024 1135   LDLCALC 93 02/02/2020 1348   LDLDIRECT 106 (H) 02/02/2020 1348    LDLDIRECT 105.9 02/08/2013 1050      Wt Readings from Last 3 Encounters:  07/27/24 165 lb 4  oz (75 kg)  07/12/24 169 lb 3.2 oz (76.7 kg)  06/07/24 171 lb 8 oz (77.8 kg)           No data to display            ASSESSMENT AND PLAN:   1.  Paroxysmal atrial fibrillation: She is doing well overall with minimal palpitations at the present time.  Continue current dose of diltiazem .  Continue anticoagulation with Eliquis  5 mg twice daily.    2.  Lower extremity edema:  likely multifactorial due to chronic venous insufficiency as well as treatment with diltiazem .  This almost resolved completely with furosemide .   3. Essential hypertension:   Blood pressure is reasonably controlled.  4. Hyperlipidemia: No documented history of coronary artery disease.  Most recent lipid profile showed an LDL of 111.  She is currently on atorvastatin  10 mg daily.  5.  Aortic stenosis: Mild on most recent echocardiogram in November 2024 with a mean gradient of 7 mmHg.  Cardiac murmur is unchanged.     Disposition:   FU in 6 months.   Signed,  Deatrice Cage, MD 07/27/24 [facility] [place], [place] [phone]

## 2024-07-27 NOTE — Patient Instructions (Signed)

## 2024-07-31 ENCOUNTER — Other Ambulatory Visit: Payer: Self-pay | Admitting: Internal Medicine

## 2024-07-31 NOTE — Telephone Encounter (Signed)
 Last Fill: 07/12/2024  Labs: 05/29/2024 RBC 3.22 Hemoglobin 10.9 HCT 34.0 MCV 105.6 Platelets 463 CMP is WNL  Next Visit: 10/12/2024  Last Visit: 07/12/2024  DX: Seropositive rheumatoid arthritis   Current Dose per office note 07/12/2024: Decrease methotrexate  dose to 0.5 mL (12.5 mg) [place] weekly.   Okay to refill Methotrexate ?   Contacted patient to confirm she is taking it, patient is taking it and is doing 0.17mls .

## 2024-08-14 ENCOUNTER — Other Ambulatory Visit: Payer: Self-pay | Admitting: Family Medicine

## 2024-08-14 ENCOUNTER — Other Ambulatory Visit: Payer: Self-pay | Admitting: Cardiovascular Disease

## 2024-08-14 DIAGNOSIS — F419 Anxiety disorder, unspecified: Secondary | ICD-10-CM

## 2024-08-14 NOTE — Telephone Encounter (Signed)
 Name of Medication:  Clorazepate  Name of Pharmacy:  [facility] Last Fill or Written Date and Quantity:  03/23/24, #30 Last Office Visit and Type:  06/07/24, HFU Next Office Visit and Type:  none Last Controlled Substance Agreement Date:  06/30/13 Last UDS:  none

## 2024-08-17 DIAGNOSIS — M25552 Pain in left hip: Secondary | ICD-10-CM | POA: Diagnosis not present

## 2024-08-22 DIAGNOSIS — Z23 Encounter for immunization: Secondary | ICD-10-CM | POA: Diagnosis not present

## 2024-08-23 ENCOUNTER — Ambulatory Visit

## 2024-08-23 DIAGNOSIS — E538 Deficiency of other specified B group vitamins: Secondary | ICD-10-CM | POA: Diagnosis not present

## 2024-08-23 MED ORDER — CYANOCOBALAMIN 1000 MCG/ML IJ SOLN
1000.0000 ug | Freq: Once | INTRAMUSCULAR | Status: AC
  Administered 2024-08-23: 1000 ug via INTRAMUSCULAR

## 2024-08-23 NOTE — Progress Notes (Signed)
 Per orders of Dr. Roxy Manns, injection of vitamin b 12 given by Lewanda Rife in right deltoid. Patient tolerated injection well. Patient will make appointment for 1 month.

## 2024-09-02 ENCOUNTER — Other Ambulatory Visit: Payer: Self-pay | Admitting: Cardiovascular Disease

## 2024-09-02 DIAGNOSIS — I4891 Unspecified atrial fibrillation: Secondary | ICD-10-CM

## 2024-09-04 NOTE — Telephone Encounter (Signed)
 Prescription refill request for Eliquis  received. Indication: PAF Last office visit: 07/27/24  Epic Scr: 0.97 on 07/21/24  Epic Age: 89 Weight: 75kg  Based on above findings Eliquis  5mg  twice daily is the appropriate dose.  Refill approved.

## 2024-09-11 ENCOUNTER — Ambulatory Visit: Payer: Self-pay

## 2024-09-11 NOTE — Telephone Encounter (Signed)
 Aware, will watch for correspondence Thanks for seeing her

## 2024-09-11 NOTE — Telephone Encounter (Signed)
 Appt scheduled with Dr. Bennett tomorrow. FYI to her and Dr. Randeen

## 2024-09-11 NOTE — Telephone Encounter (Signed)
 FYI Only or Action Required?: FYI only for provider.  Patient was last seen in primary care on 06/07/2024 by Randeen Laine LABOR, MD.  Called Nurse Triage reporting Arm Swelling.  Symptoms began several days ago.  Interventions attempted: Nothing.  Symptoms are: stable.  Triage Disposition: See PCP When Office is Open (Within 3 Days)  Patient/caregiver understands and will follow disposition?: Yes Reason for Disposition  MILD OR MODERATE joint swelling (e.g., feels or looks mildly swollen or puffy)  Answer Assessment - Initial Assessment Questions Unsure what could be the cause, thinking maybe an insect bite.   1. LOCATION: Where is the swelling? (e.g., left, right, both elbows)     Towards inside of left arm, near elbow, but not on the elbow  2. SIZE and DESCRIPTION: What does the swelling look like? (e.g., entire elbow, localized)     About 1/2 and inch  3. ONSET: When did the swelling start? Does it come and go, or is it there all the time?     Couple days ago  6. ASSOCIATED SYMPTOMS: Is there any pain or redness?     Redness, denies pain or itching  7. OTHER SYMPTOMS: Do you have any other symptoms? (e.g., fever)     Denies  Protocols used: Elbow Swelling-A-AH  Copied from CRM #[id number]. Topic: Clinical - Red Word Triage >> Sep 11, 2024  3:11 PM Dedra B wrote: [facility] that prompted transfer to Nurse Triage: Pt has swelling and redness near L arm. She is unsure if she was bitten by something. Warm transfer to NT.

## 2024-09-12 ENCOUNTER — Ambulatory Visit (INDEPENDENT_AMBULATORY_CARE_PROVIDER_SITE_OTHER)

## 2024-09-12 VITALS — BP 136/72 | HR 79 | Temp 98.6°F | Ht 64.5 in | Wt 164.0 lb

## 2024-09-12 DIAGNOSIS — S50362A Insect bite (nonvenomous) of left elbow, initial encounter: Secondary | ICD-10-CM | POA: Diagnosis not present

## 2024-09-12 DIAGNOSIS — W57XXXA Bitten or stung by nonvenomous insect and other nonvenomous arthropods, initial encounter: Secondary | ICD-10-CM | POA: Diagnosis not present

## 2024-09-12 MED ORDER — CEPHALEXIN 500 MG PO CAPS: 500.0000 mg | ORAL_CAPSULE | Freq: Four times a day (QID) | ORAL | 0 refills | Status: AC

## 2024-09-12 NOTE — Patient Instructions (Signed)
 Thank you for visiting [facility] today! Here's what we talked about: - Start Keflex  for 5 days - Use Neosporin twice daily on the wound

## 2024-09-12 NOTE — Progress Notes (Signed)
 Subjective:   This visit was conducted in person. The patient gave informed consent to the use of Abridge AI technology to record the contents of the encounter as documented below.   Patient ID: Tammy Manning, female    DOB: [birth year], 89 y.o.   MRN: [id number]   Discussed the use of AI scribe software for clinical note transcription with the patient, who gave verbal consent to proceed.  History of Present Illness Tammy Manning is an 89 year old female who presents with a draining lesion on her left elbow.  Three days ago, she noticed a lesion on her left elbow, initially presenting as a red area with a small dot, which she suspects might have been a bite. The lesion has since opened and started draining a fluid that appears to be a mix of serous and purulent material. There is no associated itching, pain, fever, or chills, and no other lesions are noted elsewhere on her body.  She has a history of thin skin and is on Eliquis , which causes her to bruise easily. She recalls a previous incident where she was treated with an antibiotic for a red area on her skin, although she does not recall the specific antibiotic used at that time.  She lives in an area where she frequently encounters spiders, particularly on her deck, but she does not believe the lesion is from a tick bite. She has not observed any large spiders, such as brown recluses, in the area.  She used Mederma for bruising, and she has Neosporin and triple antibiotic ointment at home.   Review of Systems  All other systems reviewed and are negative.       Allergies  Allergen Reactions   Antihistamines, Chlorpheniramine-Type Other (See Comments)    Reaction:  Makes pt hyper    Atenolol Hypertension   Cefuroxime  Axetil Other (See Comments)    Upset stomach   Ciprofloxacin Other (See Comments)    Upset stomach   Co Q 10 [Coenzyme Q10] Other (See Comments)    Reaction:  GI upset    Crestor  [Rosuvastatin   Calcium ] Other (See Comments)    Reaction:  Myalgias    Dextromethorphan-Guaifenesin Other (See Comments)    Reaction:  Made pt jittery    Diflucan  [Fluconazole ] Other (See Comments)    dizzy   Elavil  [Amitriptyline ]     Felt worse /tearful    Epinephrine  Hives   Methenamine      Tongue discomfort    Ramipril Other (See Comments)    Upset stomach   Sulfamethoxazole-Trimethoprim  Other (See Comments)    Upset stomach   Vitamin D  Analogs Other (See Comments)    Reaction:  GI upset     Current Outpatient Medications on File Prior to Visit  Medication Sig Dispense Refill   atorvastatin  (LIPITOR) 10 MG tablet Take 1 tablet (10 mg total) by mouth daily. 90 tablet 3   Calcium  Carbonate-Vitamin D  (CALCIUM  600+D PO) Take 1 tablet by mouth daily.      clobetasol  ointment (TEMOVATE ) 0.05 % Apply 1 application topically 2 (two) times daily as needed.     clorazepate  (TRANXENE ) 7.5 MG tablet TAKE ONE TABLET BY MOUTH ONCE DAILY AS NEEDED 30 tablet 0   cyanocobalamin  (,VITAMIN B-12,) 1000 MCG/ML injection Inject 1,000 mcg into the muscle every 30 (thirty) days.      diltiazem  (CARDIZEM  CD) 180 MG 24 hr capsule TAKE 1 CAPSULE BY MOUTH EVERY DAY 90 capsule 3   ELIQUIS  5 MG TABS tablet  TAKE 1 TABLET BY MOUTH TWICE DAILY 180 tablet 1   famotidine  (PEPCID ) 20 MG tablet Take 20 mg by mouth 2 (two) times daily as needed for heartburn or indigestion.     furosemide  (LASIX ) 20 MG tablet TAKE ONE TABLET BY MOUTH ONCE DAILY 90 tablet 2   guaiFENesin (MUCINEX) 600 MG 12 hr tablet Take 600 mg by mouth 2 (two) times daily as needed.     hydrocortisone  (ANUSOL -HC) 25 MG suppository Place 1 suppository (25 mg total) rectally 2 (two) times daily. 10 suppository 0   irbesartan  (AVAPRO ) 150 MG tablet Take 1 tablet (150 mg total) by mouth daily. 90 tablet 2   loperamide  (IMODIUM ) 2 MG capsule Take 2 mg by mouth as needed for diarrhea or loose stools.     methotrexate  50 MG/2ML injection Inject 0.5 mLs (12.5 mg total)  into the skin once a week.     Methotrexate  Sodium (METHOTREXATE , PF,) 50 MG/2ML injection Inject 0.5 mLs (12.5 mg total) into the muscle once a week. 6 mL 0   triamcinolone  (NASACORT ) 55 MCG/ACT AERO nasal inhaler Place 2 sprays into the nose daily. 1 each 12   trimethoprim  (TRIMPEX ) 100 MG tablet Take 100 mg by mouth daily.     Tuberculin-Allergy  Syringes 27G X 1/2 1 ML MISC Use 1 syringe once weekly to inject methotrexate . 25 each 1   No current facility-administered medications on file prior to visit.    BP 136/72 (BP Location: Right Arm, Patient Position: Sitting, Cuff Size: Normal)   Pulse 79   Temp 98.6 F (37 C) (Oral)   Ht 5' 4.5 (1.638 m)   Wt 164 lb (74.4 kg)   SpO2 98%   BMI 27.72 kg/m   Objective:      Physical Exam GENERAL: Alert, cooperative, well developed, no acute distress. HEAD: Normocephalic atraumatic. NEUROLOGICAL: Oriented to person, place and time, no gait abnormalities, moves all extremities without gross motor or sensory deficit. SKIN: 3x3 cm circular lesion with central opening surrounding erythema on left lateral elbow. Local skin around lesion not indurated, warm or tender to palpation. No skin necrosis.       Assessment & Plan:   Assessment & Plan 1. Insect bite of left elbow, initial encounter (Primary) Three-day history of a left elbow skin lesion with erythema and drainage, likely an insect bite. Lesion decreased in size with topical treatment. Low suspicion for abscess at this time given the lesion is not indurated, nonfluctuant, nontender.  Surrounding swelling is mild and secondary to local reaction, as well as some subcutaneous bleeding from being on Eliquis .  No indication for topical or oral antihistamine this time given lack of itching.  Will treat with short course of Keflex  given significant break in the skin, counseled patient about potential risks/side effects.   - Instructed to apply Neosporin or triple antibiotic ointment twice  daily. - Advised to monitor for signs of spreading infection and seek emergency care if these occur. - Provided written care instructions. - No immediate follow-up unless no improvement after antibiotics. - cephALEXin  (KEFLEX ) 500 MG capsule; Take 1 capsule (500 mg total) by mouth 4 (four) times daily for 5 days.  Dispense: 20 capsule; Refill: 0    Return for worsening of symptoms or failure to improve.   Tammy Madry K Albie Bazin, MD  09/12/24     Contains text generated by Abridge.

## 2024-09-20 ENCOUNTER — Encounter: Payer: Self-pay | Admitting: Family Medicine

## 2024-09-20 ENCOUNTER — Ambulatory Visit: Admitting: Family Medicine

## 2024-09-20 VITALS — BP 126/74 | HR 73 | Temp 98.4°F | Ht 64.5 in | Wt 166.5 lb

## 2024-09-20 DIAGNOSIS — E538 Deficiency of other specified B group vitamins: Secondary | ICD-10-CM

## 2024-09-20 DIAGNOSIS — R32 Unspecified urinary incontinence: Secondary | ICD-10-CM

## 2024-09-20 DIAGNOSIS — Z87440 Personal history of urinary (tract) infections: Secondary | ICD-10-CM

## 2024-09-20 DIAGNOSIS — S41102D Unspecified open wound of left upper arm, subsequent encounter: Secondary | ICD-10-CM

## 2024-09-20 DIAGNOSIS — N390 Urinary tract infection, site not specified: Secondary | ICD-10-CM

## 2024-09-20 DIAGNOSIS — N1831 Chronic kidney disease, stage 3a: Secondary | ICD-10-CM

## 2024-09-20 DIAGNOSIS — S41102A Unspecified open wound of left upper arm, initial encounter: Secondary | ICD-10-CM | POA: Insufficient documentation

## 2024-09-20 DIAGNOSIS — R300 Dysuria: Secondary | ICD-10-CM

## 2024-09-20 LAB — POC URINALSYSI DIPSTICK (AUTOMATED)
Bilirubin, UA: NEGATIVE
Blood, UA: NEGATIVE
Glucose, UA: NEGATIVE
Ketones, UA: NEGATIVE
Leukocytes, UA: NEGATIVE
Nitrite, UA: NEGATIVE
Protein, UA: NEGATIVE
Spec Grav, UA: 1.01 (ref 1.010–1.025)
Urobilinogen, UA: 0.2 U/dL
pH, UA: 6 (ref 5.0–8.0)

## 2024-09-20 MED ORDER — CYANOCOBALAMIN 1000 MCG/ML IJ SOLN
1000.0000 ug | Freq: Once | INTRAMUSCULAR | Status: AC
  Administered 2024-09-20: 1000 ug via INTRAMUSCULAR

## 2024-09-20 NOTE — Patient Instructions (Addendum)
 Keep the skin lesion on your elbow area very clean with soap and water  Use aquaphor or antibiotic ointment 1-2 times per day  Protect from dirt   If worse - redness/ bruising/ swelling/ discomfort or itching let us  know  If not improving also let us  know   Take care of yourself   I put the referral in for urology in [place]  Please let us  know if you don't hear in 1-2 weeks to set that up (mychart message or call or letter)

## 2024-09-20 NOTE — Progress Notes (Signed)
 Subjective:    Patient ID: Tammy Manning, female    DOB: 12-06-1934, 89 y.o.   MRN: [id number]  HPI  Wt Readings from Last 3 Encounters:  09/20/24 166 lb 8 oz (75.5 kg)  09/12/24 164 lb (74.4 kg)  07/27/24 165 lb 4 oz (75 kg)   28.14 kg/m  Vitals:   09/20/24 1056  BP: 126/74  Pulse: 73  Temp: 98.4 F (36.9 C)  SpO2: 99%    Pt presents for  Frequent utis  In setting of ckd 3  B12 shot - was due 11/4, wants to get while she is here  Bug bite on arm   Skin spot started around 10/19  Just woke up with it  Not painful  Not itchy Saw Dr Bennett- presumed insect bite  Treated for skin infection  Short course of keflex  Pt used over the counter topical med for bruising  (arnicar)  Unsure of name of the medicine  No other topicals No oint or cortisone   Drying up  Improved  Skin feels funny but not painful  Not tight  Not itchy  Not rubbing or scratching  Has not had a fever    Urinary history  Dr Twylla [facility] Dr Carolee  CT abd/pelvis 02/2023 -no stones or cause found  Per pt - cystoscopy several times- all ok  Frequent utis  Takes trimethoprim  100 mg daily for proph  Mixed incontinence  (gemtasa in past) , estrogen vaginal cream   Intol of  Sulfa drug  Cefuroxime  Cipro   Had e coli (pan sens) uti in July and took keflex    Has had to take fosfomycin in past  Also   Does take methotrexate  for RA  Results for orders placed or performed in visit on 09/20/24  POCT Urinalysis Dipstick (Automated)   Collection Time: 09/20/24 11:13 AM  Result Value Ref Range   Color, UA Yellow    Clarity, UA Clear    Glucose, UA Negative Negative   Bilirubin, UA Negative    Ketones, UA Negative    Spec Grav, UA 1.010 1.010 - 1.025   Blood, UA Negative    pH, UA 6.0 5.0 - 8.0   Protein, UA Negative Negative   Urobilinogen, UA 0.2 0.2 or 1.0 E.U./dL   Nitrite, UA Negative    Leukocytes, UA Negative Negative    No urinary symptoms at all   Has  burning with urination that comes and goes   Good with fluid intake   Lab Results  Component Value Date   NA 136 07/21/2024   K 5.0 07/21/2024   CO2 19 (L) 07/21/2024   GLUCOSE 85 07/21/2024   BUN 26 07/21/2024   CREATININE 0.97 07/21/2024   CALCIUM  9.7 07/21/2024   GFR 65.40 05/10/2024   EGFR 57 (L) 07/21/2024   GFRNONAA >60 05/29/2024      Patient Active Problem List   Diagnosis Date Noted   Wound of left upper extremity 09/20/2024   CKD (chronic kidney disease) stage 3, GFR 30-59 ml/min (HCC) 05/10/2024   Hyperkalemia 04/25/2024   Decreased GFR 04/25/2024   Ankle edema, bilateral 04/19/2024   Neck pain 03/02/2024   Pain of left scapula 02/23/2024   Rhomboid muscle pain 02/23/2024   Diabetes mellitus screening 01/27/2024   Thrush 01/27/2024   Anxious mood 09/20/2023   Tremor of both hands 09/20/2023   Head injury 04/28/2023   Superficial injury of skin 04/21/2023   Dysuria 04/12/2023   Diverticulosis 03/21/2023  Chronic diastolic heart failure (HCC) 03/13/2023   Obesity (BMI 30-39.9) 03/13/2023   PAF (paroxysmal atrial fibrillation) (HCC) 03/13/2023   Recurrent UTI (urinary tract infection) 02/18/2023   Intertrigo 02/16/2023   Hemorrhoids 02/02/2023   Urinary incontinence 02/02/2023   Dilated aortic root 01/26/2023   Osteopenia 01/26/2023   Multinodular goiter 02/27/2022   Encounter for screening mammogram for breast cancer 01/23/2022   High risk medication use 10/07/2021   Atrial fibrillation with RVR (HCC) 04/28/2021   Hypertrophic toenail 04/22/2021   Seropositive rheumatoid arthritis (HCC) 11/25/2020   Bilateral hand pain 11/25/2020   Bilateral shoulder pain 10/16/2020   Joint pain 10/16/2020   Estrogen deficiency 04/30/2020   Dry mouth 03/15/2020   Osteoarthritis of right hip 01/02/2020   Right thyroid  nodule 05/12/2018   History of TIA (transient ischemic attack) 05/09/2018   Aortic insufficiency 09/10/2015   Stress reaction 09/08/2015    Encounter for Medicare annual wellness exam 02/28/2013   Bucket-handle tear of lateral meniscus of left knee as current injury 12/30/2012   Episodic atrial fibrillation (HCC) 11/17/2012   Hearing loss of both ears 01/14/2012   B12 deficiency 03/17/2010   GERD (gastroesophageal reflux disease) 03/17/2010   POSTMENOPAUSAL STATUS 08/20/2008   Essential hypertension 07/19/2007   Hyperlipidemia 06/24/2007   IBS 06/24/2007   DERMATITIS, ATOPIC 06/24/2007   SKIN CANCER, HX OF 06/24/2007   Past Medical History:  Diagnosis Date   A-fib (HCC)    Anginal pain    Aortic insufficiency    a. 02/2020 Echo: EF 60-65%, no rwma, Gr1 DD, nl RV fxn, mildly dil LA, triv AI, Asc Ao 39mm; b. 04/2021 Echo: EF 60-65%, no rwma, GrI DD, nl RV fxn, mildly dil LA, AI not visualized. Mild-mod AoV sclerosis w/o stenosis.   Arthritis    Knees   B12 deficiency    Mild   Bucket-handle tear of lateral meniscus of left knee as current injury 12/30/2012   Cancer (HCC)    Skin, basal cell on nose and eyelid   Colitis    COVID    DJD (degenerative joint disease)    Low back   Dyspepsia    GERD (gastroesophageal reflux disease)    Heart murmur    Heart palpitations    History of cardiac cath    a. 02/2017 Cath: Nl cors. Nl LV fxn. Abd Aogram w/o RAS.   HOH (hard of hearing)    Hyperlipidemia    Hypertension    IBS (irritable bowel syndrome)    Lactose intolerance    Mixed incontinence    PONV (postoperative nausea and vomiting)    Urinary tract infection    Vulvar irritation    from urine pad, uses Triamcinolone  oint.   Past Surgical History:  Procedure Laterality Date   ABDOMINAL AORTOGRAM N/A 03/15/2017   Procedure: Abdominal Aortogram;  Surgeon: Deatrice DELENA Cage, MD;  Location: [facility];  Service: Cardiovascular;  Laterality: N/A;   ABDOMINAL HYSTERECTOMY  1982   Large fibroids and bleeding   APPENDECTOMY     BILATERAL SALPINGOOPHORECTOMY  1986   BONE GRAFT HIP ILIAC CREST     To Left arm    BREAST REDUCTION SURGERY     BREAST SURGERY     CARDIAC CATHETERIZATION     CATARACT EXTRACTION W/PHACO Left 07/07/2016   Procedure: CATARACT EXTRACTION PHACO AND INTRAOCULAR LENS PLACEMENT (IOC);  Surgeon: Elsie Carmine, MD;  Location: [facility];  Service: Ophthalmology;  Laterality: Left;  US  01:10 AP% 18.3 CDE 12.91 Fluid  pak lot # [id number]   CATARACT EXTRACTION W/PHACO Right 07/28/2016   Procedure: CATARACT EXTRACTION PHACO AND INTRAOCULAR LENS PLACEMENT (IOC);  Surgeon: Elsie Carmine, MD;  Location: [facility];  Service: Ophthalmology;  Laterality: Right;  US  01:26 AP% 18.5 CDE 16.12 Fluid pack lot # [id number]   COLONOSCOPY  07/2016   Dr. Kemper   COLONOSCOPY WITH PROPOFOL  N/A 04/01/2023   Procedure: COLONOSCOPY WITH PROPOFOL ;  Surgeon: Unk Corinn Skiff, MD;  Location: [facility];  Service: Gastroenterology;  Laterality: N/A;   DILATION AND CURETTAGE OF UTERUS     miscarragesx2   ESOPHAGOGASTRODUODENOSCOPY     Polyps   HEMORRHOID SURGERY  2006   KNEE ARTHROSCOPY WITH LATERAL MENISECTOMY Left 12/30/2012   Procedure: KNEE ARTHROSCOPY WITH LATERAL MENISECTOMY;  Surgeon: Fonda SHAUNNA Olmsted, MD;  Location: [facility];  Service: Orthopedics;  Laterality: Left;  LEFT KNEE SCOPE LATERAL MENISCECTOMY   LEFT HEART CATH AND CORONARY ANGIOGRAPHY N/A 03/15/2017   Procedure: Left Heart Cath and Coronary Angiography;  Surgeon: Deatrice DELENA Cage, MD;  Location: [facility];  Service: Cardiovascular;  Laterality: N/A;   MOUTH SURGERY     skin cancer eyelid  2012   TONSILLECTOMY     Social History   Tobacco Use   Smoking status: Former    Current packs/day: 0.00    Average packs/day: 1 pack/day for 12.0 years (12.0 ttl pk-yrs)    Types: Cigarettes    Start date: 11/24/1951    Quit date: 11/24/1963    Years since quitting: 60.8    Passive exposure: Never   Smokeless tobacco: Never  Vaping Use   Vaping status: Never Used  Substance Use Topics   Alcohol use: No     Alcohol/week: 0.0 standard drinks of alcohol   Drug use: No   Family History  Problem Relation Age of Onset   Cancer Mother        Colon   Heart disease Father        CAD   Diabetes Sister    Hypothyroidism Sister    Diabetes Sister    Alzheimer's disease Sister    Diabetes Brother    Leukemia Brother    Esophageal cancer Brother    Diabetes Brother    Cancer Paternal Aunt        Breast   Breast cancer Daughter    Cancer Paternal Aunt    Cancer Brother    Diabetes Brother    Allergies  Allergen Reactions   Antihistamines, Chlorpheniramine-Type Other (See Comments)    Reaction:  Makes pt hyper    Atenolol Hypertension   Cefuroxime  Axetil Other (See Comments)    Upset stomach   Ciprofloxacin Other (See Comments)    Upset stomach   Co Q 10 [Coenzyme Q10] Other (See Comments)    Reaction:  GI upset    Crestor  [Rosuvastatin  Calcium ] Other (See Comments)    Reaction:  Myalgias    Dextromethorphan-Guaifenesin Other (See Comments)    Reaction:  Made pt jittery    Diflucan  [Fluconazole ] Other (See Comments)    dizzy   Elavil  [Amitriptyline ]     Felt worse /tearful    Epinephrine  Hives   Methenamine      Tongue discomfort    Ramipril Other (See Comments)    Upset stomach   Sulfamethoxazole-Trimethoprim  Other (See Comments)    Upset stomach   Vitamin D  Analogs Other (See Comments)    Reaction:  GI upset    Current Outpatient Medications on File  Prior to Visit  Medication Sig Dispense Refill   atorvastatin  (LIPITOR) 10 MG tablet Take 1 tablet (10 mg total) by mouth daily. 90 tablet 3   Calcium  Carbonate-Vitamin D  (CALCIUM  600+D PO) Take 1 tablet by mouth daily.      clobetasol  ointment (TEMOVATE ) 0.05 % Apply 1 application topically 2 (two) times daily as needed.     clorazepate  (TRANXENE ) 7.5 MG tablet TAKE ONE TABLET BY MOUTH ONCE DAILY AS NEEDED 30 tablet 0   cyanocobalamin  (,VITAMIN B-12,) 1000 MCG/ML injection Inject 1,000 mcg into the muscle every 30 (thirty)  days.      diltiazem  (CARDIZEM  CD) 180 MG 24 hr capsule TAKE 1 CAPSULE BY MOUTH EVERY DAY 90 capsule 3   ELIQUIS  5 MG TABS tablet TAKE 1 TABLET BY MOUTH TWICE DAILY 180 tablet 1   famotidine  (PEPCID ) 20 MG tablet Take 20 mg by mouth 2 (two) times daily as needed for heartburn or indigestion.     furosemide  (LASIX ) 20 MG tablet TAKE ONE TABLET BY MOUTH ONCE DAILY 90 tablet 2   guaiFENesin (MUCINEX) 600 MG 12 hr tablet Take 600 mg by mouth 2 (two) times daily as needed.     hydrocortisone  (ANUSOL -HC) 25 MG suppository Place 1 suppository (25 mg total) rectally 2 (two) times daily. 10 suppository 0   irbesartan  (AVAPRO ) 150 MG tablet Take 1 tablet (150 mg total) by mouth daily. 90 tablet 2   loperamide  (IMODIUM ) 2 MG capsule Take 2 mg by mouth as needed for diarrhea or loose stools.     methotrexate  50 MG/2ML injection Inject 0.5 mLs (12.5 mg total) into the skin once a week.     Methotrexate  Sodium (METHOTREXATE , PF,) 50 MG/2ML injection Inject 0.5 mLs (12.5 mg total) into the muscle once a week. 6 mL 0   triamcinolone  (NASACORT ) 55 MCG/ACT AERO nasal inhaler Place 2 sprays into the nose daily. 1 each 12   trimethoprim  (TRIMPEX ) 100 MG tablet Take 100 mg by mouth daily.     Tuberculin-Allergy  Syringes 27G X 1/2 1 ML MISC Use 1 syringe once weekly to inject methotrexate . 25 each 1   No current facility-administered medications on file prior to visit.    Review of Systems     Objective:   Physical Exam Constitutional:      General: She is not in acute distress.    Appearance: Normal appearance. She is well-developed and normal weight. She is not ill-appearing or diaphoretic.  HENT:     Head: Normocephalic and atraumatic.  Eyes:     Conjunctiva/sclera: Conjunctivae normal.     Pupils: Pupils are equal, round, and reactive to light.  Neck:     Thyroid : No thyromegaly.     Vascular: No carotid bruit or JVD.  Cardiovascular:     Rate and Rhythm: Normal rate and regular rhythm.     Heart  sounds: Normal heart sounds.     No gallop.  Pulmonary:     Effort: Pulmonary effort is normal. No respiratory distress.     Breath sounds: Normal breath sounds. No wheezing or rales.  Abdominal:     General: There is no distension or abdominal bruit.     Palpations: Abdomen is soft.     Tenderness: There is no abdominal tenderness.     Comments: No suprapubic tenderness or fullness    Musculoskeletal:     Cervical back: Normal range of motion and neck supple.     Right lower leg: No edema.  Left lower leg: No edema.  Lymphadenopathy:     Cervical: No cervical adenopathy.  Skin:    General: Skin is warm and dry.     Coloration: Skin is not pale.     Findings: No rash.     Comments: 3-4 cm area of mild induration and old eccymosis with 1 cm scab in center near left elbow on anterior forearm  Non tender No fluctuance Very little erythema  No drainage    Neurological:     Mental Status: She is alert.     Coordination: Coordination normal.     Deep Tendon Reflexes: Reflexes are normal and symmetric. Reflexes normal.  Psychiatric:        Mood and Affect: Mood normal.           Assessment & Plan:   Problem List Items Addressed This Visit       Genitourinary   Recurrent UTI (urinary tract infection)   Reviewed last note/testing from Dr Carolee and Dr Twylla Normal CT in past  Per pt normal cystoscopy  Also has mixed incontinence  Intermittent dysuria  ? Colonization  Neg urinalysis today  Needs to move urology care back to [facility] due to transporation issues  Referral made Encouraged to continue estrogen vaginal cream Continues trimethoprim  100 mg daily        Relevant Orders   Ambulatory referral to Urology   CKD (chronic kidney disease) stage 3, GFR 30-59 ml/min (HCC)   Lab Results  Component Value Date   NA 136 07/21/2024   K 5.0 07/21/2024   CO2 19 (L) 07/21/2024   GLUCOSE 85 07/21/2024   BUN 26 07/21/2024   CREATININE 0.97 07/21/2024   CALCIUM   9.7 07/21/2024   GFR 65.40 05/10/2024   EGFR 57 (L) 07/21/2024   GFRNONAA >60 05/29/2024   Monitoring  Encouraged fluid intake  History of recurrent uti        Other   Wound of left upper extremity - Primary   ? Insect bite Saw Dr Donold notes and plan from 10/22 Now indurated area with scab and old bruise Improved after keflex   No abscess or drainage  Encouraged clean and aquaphor or antibiotic ointment Warm or cool compress if needed  Red flags reviewed-see AVS  Call back and Er precautions noted in detail today        Urinary incontinence   Wears pad all the time  Gemtesa  no longer on med list   Needs to est back to urol in [place]  Ref done       Relevant Orders   Ambulatory referral to Urology   Dysuria   Intermittent Not always with uti?  None today  Urinalysis clear today  On proph tremiethoprim Needs to est with new urology practice in [place] (can no longer drive to [place])   Encouraged good fluid intake and avoidance of bladder irritants       Relevant Orders   Ambulatory referral to Urology   B12 deficiency   B12 shot today      Other Visit Diagnoses       History of UTI       Relevant Orders   POCT Urinalysis Dipstick (Automated) (Completed)

## 2024-09-20 NOTE — Assessment & Plan Note (Signed)
 B12 shot today.

## 2024-09-20 NOTE — Assessment & Plan Note (Signed)
 Wears pad all the time  Gemtesa  no longer on med list   Needs to est back to urol in [place]  Ref done

## 2024-09-20 NOTE — Assessment & Plan Note (Signed)
 Reviewed last note/testing from Dr Carolee and Dr Stoioff Normal CT in past  Per pt normal cystoscopy  Also has mixed incontinence  Intermittent dysuria  ? Colonization  Neg urinalysis today  Needs to move urology care back to [facility] due to transporation issues  Referral made Encouraged to continue estrogen vaginal cream Continues trimethoprim  100 mg daily

## 2024-09-20 NOTE — Assessment & Plan Note (Addendum)
 Intermittent Not always with uti?  None today  Urinalysis clear today  On proph tremiethoprim Needs to est with new urology practice in [place] (can no longer drive to [facility])   Encouraged good fluid intake and avoidance of bladder irritants

## 2024-09-20 NOTE — Assessment & Plan Note (Signed)
?   Insect bite Saw Dr Donold notes and plan from 10/22 Now indurated area with scab and old bruise Improved after keflex   No abscess or drainage  Encouraged clean and aquaphor or antibiotic ointment Warm or cool compress if needed  Red flags reviewed-see AVS  Call back and Er precautions noted in detail today

## 2024-09-20 NOTE — Assessment & Plan Note (Signed)
 Lab Results  Component Value Date   NA 136 07/21/2024   K 5.0 07/21/2024   CO2 19 (L) 07/21/2024   GLUCOSE 85 07/21/2024   BUN 26 07/21/2024   CREATININE 0.97 07/21/2024   CALCIUM  9.7 07/21/2024   GFR 65.40 05/10/2024   EGFR 57 (L) 07/21/2024   GFRNONAA >60 05/29/2024   Monitoring  Encouraged fluid intake  History of recurrent uti

## 2024-09-26 ENCOUNTER — Ambulatory Visit

## 2024-10-02 DIAGNOSIS — M170 Bilateral primary osteoarthritis of knee: Secondary | ICD-10-CM | POA: Diagnosis not present

## 2024-10-02 DIAGNOSIS — M1612 Unilateral primary osteoarthritis, left hip: Secondary | ICD-10-CM | POA: Diagnosis not present

## 2024-10-02 NOTE — Progress Notes (Deleted)
 Office Visit Note  Patient: Tammy Manning             Date of Birth: 10-Jan-1935           MRN: [id number]             PCP: Randeen Laine LABOR, MD Referring: [name], Laine LABOR, MD Visit Date: 10/12/2024   Subjective:  No chief complaint on file.   History of Present Illness: Tammy Manning is a 89 y.o. female here for follow up for seropositive RA on methotrexate  15 mg subcu weekly.    Previous HPI 07/12/2024 Tammy Manning is a 89 y.o. female here for follow up  for seropositive RA on methotrexate  15 mg subcu weekly.   She is accompanied by her husband.   She has not experienced any major flare-ups of her arthritis recently, describing her symptoms as mild with occasional episodes that resolve in a couple of days. She is not taking folic acid  supplements.   She mentions a fall that occurred since her last visit, where she 'fell and skinned everything off' while trying to bring her husband a sandwich. This incident, along with her husband's hospitalizations, contributed to her delay in returning for a follow-up. She has not had any other falls recently.   She has a history of bruising easily, which she attributes to her use of Eliquis . She also mentions doing her own housework, which sometimes leads to bumps and bruises.   Regarding her knees, she reports that they are not giving her much trouble and are managed by Dr. Josefina. She received injections in her knees two weeks ago, which have helped maintain their condition.         Previous HPI 01/11/2024 Tammy Manning is a 89 y.o. female here for follow up for seropositive RA on methotrexate  15 mg subcu weekly.     She is currently on methotrexate  0.6 mL (15 mg) for rheumatoid arthritis, which effectively manages her symptoms, particularly shoulder pain. However, she dislikes the medication due to hair loss and is interested in potentially reducing the dosage.   She has a persistent urinary tract infection and is on a continuous  antibiotic regimen with trimethoprim . Despite this, she reports no significant issues with the antibiotic treatment.   She recently had a sinus infection treated with a heavy antibiotic, which has since resolved. No further treatment is necessary at this time.   She sustained a bruise and skin abrasion on her leg from a cart accident. She managed the wound herself and notes that it is healing without signs of infection. She is on Eliquis , which contributes to bruising.   She takes B12 shots monthly and uses Centrum after fifty vitamins, which do not contain iron, as recommended by her daughter who had breast cancer. She has been unable to take folic acid  due to previous issues.     Previous HPI 10/08/2023 The patient, an 89 year old with a history of chronic urinary tract infections (UTIs), rheumatoid arthritis, and atrial fibrillation, presents for follow-up for seropositive RA on methotrexate  15 mg subcu weekly. They report a recent bout of COVID-19, which was slow to resolve and required them to temporarily discontinue methotrexate . During this period, they experienced a resurgence of joint pain, particularly in the shoulder. Upon resuming methotrexate , the patient reports significant improvement in their symptoms.   She remains on indefinite antibiotics for chronic UTI. They are currently on a daily low-dose antibiotic regimen for maintenance.   The patient also mentions some joint pain  in the right hand and shoulder, which was more pronounced when they were off methotrexate  during their COVID-19 illness. They report minimal morning stiffness and are able to maintain their daily routine, including walking their dog.  She had recent repeat knee injections with her orthopedic clinic for chronic osteoarthritis pain.   Recent CBC and CMP checked in October WBC mildly elevated 11.4 metabolic panel was normal.   Previous HPI 07/07/2023 Tammy Manning is a 89 y.o. female here for follow up for  seropositive RA on methotrexate  15 mg subcu weekly.  Joint symptoms have been pretty stable without serious exacerbation or swelling. She has pain in both hips and had a steroid injection on the right side about one month ago with a good improvement. Left side is bothering her currently but not as badly as sometimes. Noticeable both at night and when walking.  Has ongoing problems since the colitis with constipation sometimes for a week and then abdominal pain and diarrhea. Also with chronic UTI with frequency and incontinence but no painful urination or visible abnormality.   Previous HPI 03/30/2023 Tammy Manning is a 89 y.o. female here for follow up for seropositive RA on methotrexate  15 mg subcu weekly.  Not on folic acid  due to intolerance of oral supplement.  Overall arthritis symptoms are doing well.  She had recent bilateral knee steroid injections with Dr. Josefina with good improvement.  Currently no active complaint about the neck shoulder or hand pain.  She had an episode of colitis no specific causative infection or inflammatory process identified.  This has resolved but is now scheduled for colonoscopy on Thursday.     Previous HPI 12/30/22 Tammy Manning is a 89 y.o. female here for follow up for seropositive RA on methotrexate  15 mg subcu weekly and folic acid  1 mg daily.  Since her last visit she still had some ongoing pain most persistently in the neck and shoulders bilaterally but without any severe flareup compared to before starting treatment.  She has been experiencing more pain affecting her left knee where there is known severe osteoarthritis.  She started taking a lot of Tylenol  about 3 times every day 3 to 4 weeks ago but started to develop right-sided flank pain and diarrhea with yellow discolored stools.  She decreased to taking Tylenol  no more than once daily with improvement of the symptoms.  She saw Dr. Josefina with intra-articular steroid injection 2 weeks ago with a good  improvement in symptoms.  She has not had any significant interval infections.     Previous HPI 03/09/22 Tammy Manning is a 89 y.o. female here for follow up for seropositive RA on MTX 15 mg [place] weekly. She is doing very well since last visit joint pains almost all better except bilateral hips bothering her intermittently. Left knee also creaky but not much inflammation problem. She has persistent stomach irritation, not much relief with taking pepcid  as needed. [facility] able to [facility] methot[facility]ate  Rx from supply issue but she still had medication on hand. She saw urology recently with persistent abnormal UA and cultures but mostly asymptomatic.   Previous HPI 12/29/21 Tammy Manning is a 89 y.o. female here for follow up for RA after restarting methotrexate  at 15 mg [place] weekly, which had been delayed due to repeated interruptions from UTI infections and antibiotic treatment. She has noticed improvement in joint symptoms after about 2 weeks taking the methotrexate . She continues to have some nausea when she takes this despite  subcutaneous route.   Previous HPI 11/25/20 Tammy Manning is a 89 y.o. female with a history of afib, TIA, and OA here for evaluation of joint pain and positive rheumatoid factor. She has joint pain in multiple sites including both hands but also has a lot of pain with the right shoulder that started more acutely with suspected rotator cuff tear. 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 She does notice swelling in hands intermittently. She has morning stiffness lasting less than 30 minutes daily also persistent pain throughout the day. She has had left radius fracture with surgical repair but no other major surgery of her arms.     No Rheumatology ROS completed.   PMFS History:  Patient Active Problem List   Diagnosis Date Noted   Wound of left upper extremity 09/20/2024   CKD  (chronic kidney disease) stage 3, GFR 30-59 ml/min (HCC) 05/10/2024   Hyperkalemia 04/25/2024   Decreased GFR 04/25/2024   Ankle edema, bilateral 04/19/2024   Neck pain 03/02/2024   Pain of left scapula 02/23/2024   Rhomboid muscle pain 02/23/2024   Diabetes mellitus screening 01/27/2024   Thrush 01/27/2024   Anxious mood 09/20/2023   Tremor of both hands 09/20/2023   Head injury 04/28/2023   Superficial injury of skin 04/21/2023   Dysuria 04/12/2023   Diverticulosis 03/21/2023   Chronic diastolic heart failure (HCC) 03/13/2023   Obesity (BMI 30-39.9) 03/13/2023   PAF (paroxysmal atrial fibrillation) (HCC) 03/13/2023   Recurrent UTI (urinary tract infection) 02/18/2023   Intertrigo 02/16/2023   Hemorrhoids 02/02/2023   Urinary incontinence 02/02/2023   Dilated aortic root 01/26/2023   Osteopenia 01/26/2023   Multinodular goiter 02/27/2022   Encounter for screening mammogram for breast cancer 01/23/2022   High risk medication use 10/07/2021   Atrial fibrillation with RVR (HCC) 04/28/2021   Hypertrophic toenail 04/22/2021   Seropositive rheumatoid arthritis (HCC) 11/25/2020   Bilateral hand pain 11/25/2020   Bilateral shoulder pain 10/16/2020   Joint pain 10/16/2020   Estrogen deficiency 04/30/2020   Dry mouth 03/15/2020   Osteoarthritis of right hip 01/02/2020   Right thyroid  nodule 05/12/2018   History of TIA (transient ischemic attack) 05/09/2018   Aortic insufficiency 09/10/2015   Stress reaction 09/08/2015   Encounter for Medicare annual wellness exam 02/28/2013   Bucket-handle tear of lateral meniscus of left knee as current injury 12/30/2012   Episodic atrial fibrillation (HCC) 11/17/2012   Hearing loss of both ears 01/14/2012   B12 deficiency 03/17/2010   GERD (gastroesophageal reflux disease) 03/17/2010   POSTMENOPAUSAL STATUS 08/20/2008   Essential hypertension 07/19/2007   Hyperlipidemia 06/24/2007   IBS 06/24/2007   DERMATITIS, ATOPIC 06/24/2007   SKIN  CANCER, HX OF 06/24/2007    Past Medical History:  Diagnosis Date   A-fib (HCC)    Anginal pain    Aortic insufficiency    a. 02/2020 Echo: EF 60-65%, no rwma, Gr1 DD, nl RV fxn, mildly dil LA, triv AI, Asc Ao 39mm; b. 04/2021 Echo: EF 60-65%, no rwma, GrI DD, nl RV fxn, mildly dil LA, AI not visualized. Mild-mod AoV sclerosis w/o stenosis.   Arthritis    Knees   B12 deficiency    Mild   Bucket-handle tear of lateral meniscus of left knee as current injury 12/30/2012   Cancer (HCC)    Skin, basal cell on nose and eyelid   Colitis    COVID    DJD (degenerative joint disease)  Low back   Dyspepsia    GERD (gastroesophageal reflux disease)    Heart murmur    Heart palpitations    History of cardiac cath    a. 02/2017 Cath: Nl cors. Nl LV fxn. Abd Aogram w/o RAS.   HOH (hard of hearing)    Hyperlipidemia    Hypertension    IBS (irritable bowel syndrome)    Lactose intolerance    Mixed incontinence    PONV (postoperative nausea and vomiting)    Urinary tract infection    Vulvar irritation    from urine pad, uses Triamcinolone  oint.    Family History  Problem Relation Age of Onset   Cancer Mother        Colon   Heart disease Father        CAD   Diabetes Sister    Hypothyroidism Sister    Diabetes Sister    Alzheimer's disease Sister    Diabetes Brother    Leukemia Brother    Esophageal cancer Brother    Diabetes Brother    Cancer Paternal Aunt        Breast   Breast cancer Daughter    Cancer Paternal Aunt    Cancer Brother    Diabetes Brother    Past Surgical History:  Procedure Laterality Date   ABDOMINAL AORTOGRAM N/A 03/15/2017   Procedure: Abdominal Aortogram;  Surgeon: Deatrice DELENA Cage, MD;  Location: [facility];  Service: Cardiovascular;  Laterality: N/A;   ABDOMINAL HYSTERECTOMY  1982   Large fibroids and bleeding   APPENDECTOMY     BILATERAL SALPINGOOPHORECTOMY  1986   BONE GRAFT HIP ILIAC CREST     To Left arm   BREAST REDUCTION SURGERY      BREAST SURGERY     CARDIAC CATHETERIZATION     CATARACT EXTRACTION W/PHACO Left 07/07/2016   Procedure: CATARACT EXTRACTION PHACO AND INTRAOCULAR LENS PLACEMENT (IOC);  Surgeon: Elsie Carmine, MD;  Location: [facility];  Service: Ophthalmology;  Laterality: Left;  US  01:10 AP% 18.3 CDE 12.91 Fluid pak lot # [id number]   CATARACT EXTRACTION W/PHACO Right 07/28/2016   Procedure: CATARACT EXTRACTION PHACO AND INTRAOCULAR LENS PLACEMENT (IOC);  Surgeon: Elsie Carmine, MD;  Location: [facility];  Service: Ophthalmology;  Laterality: Right;  US  01:26 AP% 18.5 CDE 16.12 Fluid pack lot # [id number]   COLONOSCOPY  07/2016   Dr. Kemper   COLONOSCOPY WITH PROPOFOL  N/A 04/01/2023   Procedure: COLONOSCOPY WITH PROPOFOL ;  Surgeon: Unk Corinn Skiff, MD;  Location: [facility];  Service: Gastroenterology;  Laterality: N/A;   DILATION AND CURETTAGE OF UTERUS     miscarragesx2   ESOPHAGOGASTRODUODENOSCOPY     Polyps   HEMORRHOID SURGERY  2006   KNEE ARTHROSCOPY WITH LATERAL MENISECTOMY Left 12/30/2012   Procedure: KNEE ARTHROSCOPY WITH LATERAL MENISECTOMY;  Surgeon: Fonda SHAUNNA Olmsted, MD;  Location: [facility];  Service: Orthopedics;  Laterality: Left;  LEFT KNEE SCOPE LATERAL MENISCECTOMY   LEFT HEART CATH AND CORONARY ANGIOGRAPHY N/A 03/15/2017   Procedure: Left Heart Cath and Coronary Angiography;  Surgeon: Deatrice DELENA Cage, MD;  Location: [facility];  Service: Cardiovascular;  Laterality: N/A;   MOUTH SURGERY     skin cancer eyelid  2012   TONSILLECTOMY     Social History   Social History Narrative   Wellsite geologist.     Immunization History  Administered Date(s) Administered   Fluad Quad(high Dose 65+) 10/16/2020, 07/24/2022   INFLUENZA, HIGH DOSE SEASONAL PF 08/11/2016,  08/27/2017, 08/02/2018, 09/05/2021, 08/31/2023   Influenza Split 07/24/2011, 08/23/2014   Influenza Whole 08/23/2004, 07/24/2009, 08/06/2010, 08/05/2012   Influenza,inj,Quad PF,6+ Mos 08/16/2013,  08/07/2015   Influenza-Unspecified 09/05/2021   PFIZER(Purple Top)SARS-COV-2 Vaccination 12/15/2019, 01/05/2020, 10/25/2020   Pneumococcal Conjugate-13 08/24/2014   Pneumococcal Polysaccharide-23 08/20/2008   Respiratory Syncytial Virus Vaccine,Recomb Aduvanted(Arexvy) 12/30/2022   Td 03/20/2003, 02/28/2013, 04/21/2023   Tdap 03/15/2024   Zoster, Live 09/04/2015     Objective: Vital Signs: There were no vitals taken for this visit.   Physical Exam   Musculoskeletal Exam: ***  CDAI Exam: CDAI Score: -- Patient Global: --; Provider Global: -- Swollen: --; Tender: -- Joint Exam 10/12/2024   No joint exam has been documented for this visit   There is currently no information documented on the homunculus. Go to the Rheumatology activity and complete the homunculus joint exam.  Investigation: No additional findings.  Imaging: No results found.  Recent Labs: Lab Results  Component Value Date   WBC 8.8 05/29/2024   HGB 10.9 (L) 05/29/2024   PLT 463 (H) 05/29/2024   NA 136 07/21/2024   K 5.0 07/21/2024   CL 100 07/21/2024   CO2 19 (L) 07/21/2024   GLUCOSE 85 07/21/2024   BUN 26 07/21/2024   CREATININE 0.97 07/21/2024   BILITOT 0.4 05/29/2024   ALKPHOS 41 05/29/2024   AST 18 05/29/2024   ALT 15 05/29/2024   PROT 6.5 05/29/2024   ALBUMIN 3.6 05/29/2024   CALCIUM  9.7 07/21/2024   GFRAA 71 02/02/2020    Speciality Comments: No specialty comments available.  Procedures:  No procedures performed Allergies: Antihistamines, chlorpheniramine-type; Atenolol; Cefuroxime  axetil; Ciprofloxacin; Co q 10 [coenzyme q10]; Crestor  [rosuvastatin  calcium ]; Dextromethorphan-guaifenesin; Diflucan  [fluconazole ]; Elavil  [amitriptyline ]; Epinephrine ; Methenamine ; Ramipril; Sulfamethoxazole-trimethoprim ; and Vitamin d  analogs   Assessment / Plan:     Visit Diagnoses: No diagnosis found.  ***  Orders: No orders of the defined types were placed in this encounter.  No orders of the  defined types were placed in this encounter.    Follow-Up Instructions: No follow-ups on file.   Sharonann Malbrough M Deedra Pro, CMA  Note - This record has been created using Animal nutritionist.  Chart creation errors have been sought, but may not always  have been located. Such creation errors do not reflect on  the standard of medical care.

## 2024-10-09 ENCOUNTER — Ambulatory Visit: Admitting: Urology

## 2024-10-12 ENCOUNTER — Ambulatory Visit: Admitting: Internal Medicine

## 2024-10-12 ENCOUNTER — Other Ambulatory Visit: Payer: Self-pay | Admitting: Internal Medicine

## 2024-10-12 DIAGNOSIS — Z79899 Other long term (current) drug therapy: Secondary | ICD-10-CM

## 2024-10-12 DIAGNOSIS — R300 Dysuria: Secondary | ICD-10-CM

## 2024-10-12 DIAGNOSIS — M059 Rheumatoid arthritis with rheumatoid factor, unspecified: Secondary | ICD-10-CM

## 2024-10-12 NOTE — Telephone Encounter (Signed)
 Last Fill: 07/31/2024  Labs: 07/21/2024 BMP eGFR 57 CO2 19  05/29/2024 CBC RBC 3.22 Hemoglobin 10.9 HCT 34.0 MCV 105.6 Platelets 463  Next Visit: 11/07/2024  Last Visit: 07/12/2024  DX: Seropositive rheumatoid arthritis (HCC)   Current Dose per office note 07/12/2024: Decrease methotrexate  dose to 0.5 mL (12.5 mg)  weekly.   Patient to update labs at upcomming appointment.   Okay to refill Methotrexate ?

## 2024-10-30 DIAGNOSIS — M170 Bilateral primary osteoarthritis of knee: Secondary | ICD-10-CM | POA: Diagnosis not present

## 2024-10-30 NOTE — Progress Notes (Unsigned)
 Office Visit Note  Patient: Tammy Manning             Date of Birth: [birth year]           MRN: [id number]             PCP: Randeen Laine LABOR, MD Referring: [name], Laine LABOR, MD Visit Date: 11/07/2024   Subjective:  No chief complaint on file.   History of Present Illness: Tammy Manning is a 89 y.o. female here for follow up for seropositive RA on methotrexate  15 mg subcu weekly.    Previous HPI 07/12/2024 Tammy Manning is a 89 y.o. female here for follow up  for seropositive RA on methotrexate  15 mg subcu weekly.   She is accompanied by her husband.   She has not experienced any major flare-ups of her arthritis recently, describing her symptoms as mild with occasional episodes that resolve in a couple of days. She is not taking folic acid  supplements.   She mentions a fall that occurred since her last visit, where she 'fell and skinned everything off' while trying to bring her husband a sandwich. This incident, along with her husband's hospitalizations, contributed to her delay in returning for a follow-up. She has not had any other falls recently.   She has a history of bruising easily, which she attributes to her use of Eliquis . She also mentions doing her own housework, which sometimes leads to bumps and bruises.   Regarding her knees, she reports that they are not giving her much trouble and are managed by Dr. Josefina. She received injections in her knees two weeks ago, which have helped maintain their condition.         Previous HPI 01/11/2024 Tammy Manning is a 89 y.o. female here for follow up for seropositive RA on methotrexate  15 mg subcu weekly.     She is currently on methotrexate  0.6 mL (15 mg) for rheumatoid arthritis, which effectively manages her symptoms, particularly shoulder pain. However, she dislikes the medication due to hair loss and is interested in potentially reducing the dosage.   She has a persistent urinary tract infection and is on a continuous  antibiotic regimen with trimethoprim . Despite this, she reports no significant issues with the antibiotic treatment.   She recently had a sinus infection treated with a heavy antibiotic, which has since resolved. No further treatment is necessary at this time.   She sustained a bruise and skin abrasion on her leg from a cart accident. She managed the wound herself and notes that it is healing without signs of infection. She is on Eliquis , which contributes to bruising.   She takes B12 shots monthly and uses Centrum after fifty vitamins, which do not contain iron, as recommended by her daughter who had breast cancer. She has been unable to take folic acid  due to previous issues.     Previous HPI 10/08/2023 The patient, an 89 year old with a history of chronic urinary tract infections (UTIs), rheumatoid arthritis, and atrial fibrillation, presents for follow-up for seropositive RA on methotrexate  15 mg subcu weekly. They report a recent bout of COVID-19, which was slow to resolve and required them to temporarily discontinue methotrexate . During this period, they experienced a resurgence of joint pain, particularly in the shoulder. Upon resuming methotrexate , the patient reports significant improvement in their symptoms.   She remains on indefinite antibiotics for chronic UTI. They are currently on a daily low-dose antibiotic regimen for maintenance.   The patient also mentions some joint pain  in the right hand and shoulder, which was more pronounced when they were off methotrexate  during their COVID-19 illness. They report minimal morning stiffness and are able to maintain their daily routine, including walking their dog.  She had recent repeat knee injections with her orthopedic clinic for chronic osteoarthritis pain.   Recent CBC and CMP checked in October WBC mildly elevated 11.4 metabolic panel was normal.   Previous HPI 07/07/2023 Tammy Manning is a 89 y.o. female here for follow up for  seropositive RA on methotrexate  15 mg subcu weekly.  Joint symptoms have been pretty stable without serious exacerbation or swelling. She has pain in both hips and had a steroid injection on the right side about one month ago with a good improvement. Left side is bothering her currently but not as badly as sometimes. Noticeable both at night and when walking.  Has ongoing problems since the colitis with constipation sometimes for a week and then abdominal pain and diarrhea. Also with chronic UTI with frequency and incontinence but no painful urination or visible abnormality.   Previous HPI 03/30/2023 Tammy Manning is a 89 y.o. female here for follow up for seropositive RA on methotrexate  15 mg subcu weekly.  Not on folic acid  due to intolerance of oral supplement.  Overall arthritis symptoms are doing well.  She had recent bilateral knee steroid injections with Dr. Josefina with good improvement.  Currently no active complaint about the neck shoulder or hand pain.  She had an episode of colitis no specific causative infection or inflammatory process identified.  This has resolved but is now scheduled for colonoscopy on Thursday.     Previous HPI 12/30/22 Tammy Manning is a 89 y.o. female here for follow up for seropositive RA on methotrexate  15 mg subcu weekly and folic acid  1 mg daily.  Since her last visit she still had some ongoing pain most persistently in the neck and shoulders bilaterally but without any severe flareup compared to before starting treatment.  She has been experiencing more pain affecting her left knee where there is known severe osteoarthritis.  She started taking a lot of Tylenol  about 3 times every day 3 to 4 weeks ago but started to develop right-sided flank pain and diarrhea with yellow discolored stools.  She decreased to taking Tylenol  no more than once daily with improvement of the symptoms.  She saw Dr. Josefina with intra-articular steroid injection 2 weeks ago with a good  improvement in symptoms.  She has not had any significant interval infections.     Previous HPI 03/09/22 Tammy Manning is a 89 y.o. female here for follow up for seropositive RA on MTX 15 mg [place] weekly. She is doing very well since last visit joint pains almost all better except bilateral hips bothering her intermittently. Left knee also creaky but not much inflammation problem. She has persistent stomach irritation, not much relief with taking pepcid  as needed. [facility] not able to [facility] methot[facility]ate  Rx from supply issue but she still had medication on hand. She saw urology recently with persistent abnormal UA and cultures but mostly asymptomatic.   Previous HPI 12/29/21 Tammy Manning is a 89 y.o. female here for follow up for RA after restarting methotrexate  at 15 mg [place] weekly, which had been delayed due to repeated interruptions from UTI infections and antibiotic treatment. She has noticed improvement in joint symptoms after about 2 weeks taking the methotrexate . She continues to have some nausea when she takes this despite  subcutaneous route.   Previous HPI 11/25/20 Tammy Manning is a 89 y.o. female with a history of afib, TIA, and OA here for evaluation of joint pain and positive rheumatoid factor. She has joint pain in multiple sites including both hands but also has a lot of pain with the right shoulder that started more acutely with suspected rotator cuff tear. 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 She does notice swelling in hands intermittently. She has morning stiffness lasting less than 30 minutes daily also persistent pain throughout the day. She has had left radius fracture with surgical repair but no other major surgery of her arms.     No Rheumatology ROS completed.   PMFS History:  Patient Active Problem List   Diagnosis Date Noted   Wound of left upper extremity 09/20/2024   CKD  (chronic kidney disease) stage 3, GFR 30-59 ml/min (HCC) 05/10/2024   Hyperkalemia 04/25/2024   Decreased GFR 04/25/2024   Ankle edema, bilateral 04/19/2024   Neck pain 03/02/2024   Pain of left scapula 02/23/2024   Rhomboid muscle pain 02/23/2024   Diabetes mellitus screening 01/27/2024   Thrush 01/27/2024   Anxious mood 09/20/2023   Tremor of both hands 09/20/2023   Head injury 04/28/2023   Superficial injury of skin 04/21/2023   Dysuria 04/12/2023   Diverticulosis 03/21/2023   Chronic diastolic heart failure (HCC) 03/13/2023   Obesity (BMI 30-39.9) 03/13/2023   PAF (paroxysmal atrial fibrillation) (HCC) 03/13/2023   Recurrent UTI (urinary tract infection) 02/18/2023   Intertrigo 02/16/2023   Hemorrhoids 02/02/2023   Urinary incontinence 02/02/2023   Dilated aortic root 01/26/2023   Osteopenia 01/26/2023   Multinodular goiter 02/27/2022   Encounter for screening mammogram for breast cancer 01/23/2022   High risk medication use 10/07/2021   Atrial fibrillation with RVR (HCC) 04/28/2021   Hypertrophic toenail 04/22/2021   Seropositive rheumatoid arthritis (HCC) 11/25/2020   Bilateral hand pain 11/25/2020   Bilateral shoulder pain 10/16/2020   Joint pain 10/16/2020   Estrogen deficiency 04/30/2020   Dry mouth 03/15/2020   Osteoarthritis of right hip 01/02/2020   Right thyroid  nodule 05/12/2018   History of TIA (transient ischemic attack) 05/09/2018   Aortic insufficiency 09/10/2015   Stress reaction 09/08/2015   Encounter for Medicare annual wellness exam 02/28/2013   Bucket-handle tear of lateral meniscus of left knee as current injury 12/30/2012   Episodic atrial fibrillation (HCC) 11/17/2012   Hearing loss of both ears 01/14/2012   B12 deficiency 03/17/2010   GERD (gastroesophageal reflux disease) 03/17/2010   POSTMENOPAUSAL STATUS 08/20/2008   Essential hypertension 07/19/2007   Hyperlipidemia 06/24/2007   IBS 06/24/2007   DERMATITIS, ATOPIC 06/24/2007   SKIN  CANCER, HX OF 06/24/2007    Past Medical History:  Diagnosis Date   A-fib (HCC)    Anginal pain    Aortic insufficiency    a. 02/2020 Echo: EF 60-65%, no rwma, Gr1 DD, nl RV fxn, mildly dil LA, triv AI, Asc Ao 39mm; b. 04/2021 Echo: EF 60-65%, no rwma, GrI DD, nl RV fxn, mildly dil LA, AI not visualized. Mild-mod AoV sclerosis w/o stenosis.   Arthritis    Knees   B12 deficiency    Mild   Bucket-handle tear of lateral meniscus of left knee as current injury 12/30/2012   Cancer (HCC)    Skin, basal cell on nose and eyelid   Colitis    COVID    DJD (degenerative joint disease)  Low back   Dyspepsia    GERD (gastroesophageal reflux disease)    Heart murmur    Heart palpitations    History of cardiac cath    a. 02/2017 Cath: Nl cors. Nl LV fxn. Abd Aogram w/o RAS.   HOH (hard of hearing)    Hyperlipidemia    Hypertension    IBS (irritable bowel syndrome)    Lactose intolerance    Mixed incontinence    PONV (postoperative nausea and vomiting)    Urinary tract infection    Vulvar irritation    from urine pad, uses Triamcinolone  oint.    Family History  Problem Relation Age of Onset   Cancer Mother        Colon   Heart disease Father        CAD   Diabetes Sister    Hypothyroidism Sister    Diabetes Sister    Alzheimer's disease Sister    Diabetes Brother    Leukemia Brother    Esophageal cancer Brother    Diabetes Brother    Cancer Paternal Aunt        Breast   Breast cancer Daughter    Cancer Paternal Aunt    Cancer Brother    Diabetes Brother    Past Surgical History:  Procedure Laterality Date   ABDOMINAL AORTOGRAM N/A 03/15/2017   Procedure: Abdominal Aortogram;  Surgeon: Deatrice DELENA Cage, MD;  Location: [facility];  Service: Cardiovascular;  Laterality: N/A;   ABDOMINAL HYSTERECTOMY  1982   Large fibroids and bleeding   APPENDECTOMY     BILATERAL SALPINGOOPHORECTOMY  1986   BONE GRAFT HIP ILIAC CREST     To Left arm   BREAST REDUCTION SURGERY      BREAST SURGERY     CARDIAC CATHETERIZATION     CATARACT EXTRACTION W/PHACO Left 07/07/2016   Procedure: CATARACT EXTRACTION PHACO AND INTRAOCULAR LENS PLACEMENT (IOC);  Surgeon: Elsie Carmine, MD;  Location: [facility];  Service: Ophthalmology;  Laterality: Left;  US  01:10 AP% 18.3 CDE 12.91 Fluid pak lot # [id number]   CATARACT EXTRACTION W/PHACO Right 07/28/2016   Procedure: CATARACT EXTRACTION PHACO AND INTRAOCULAR LENS PLACEMENT (IOC);  Surgeon: Elsie Carmine, MD;  Location: [facility];  Service: Ophthalmology;  Laterality: Right;  US  01:26 AP% 18.5 CDE 16.12 Fluid pack lot # [id number]   COLONOSCOPY  07/2016   Dr. Kemper   COLONOSCOPY WITH PROPOFOL  N/A 04/01/2023   Procedure: COLONOSCOPY WITH PROPOFOL ;  Surgeon: Unk Corinn Skiff, MD;  Location: [facility];  Service: Gastroenterology;  Laterality: N/A;   DILATION AND CURETTAGE OF UTERUS     miscarragesx2   ESOPHAGOGASTRODUODENOSCOPY     Polyps   HEMORRHOID SURGERY  2006   KNEE ARTHROSCOPY WITH LATERAL MENISECTOMY Left 12/30/2012   Procedure: KNEE ARTHROSCOPY WITH LATERAL MENISECTOMY;  Surgeon: Fonda SHAUNNA Olmsted, MD;  Location: [facility];  Service: Orthopedics;  Laterality: Left;  LEFT KNEE SCOPE LATERAL MENISCECTOMY   LEFT HEART CATH AND CORONARY ANGIOGRAPHY N/A 03/15/2017   Procedure: Left Heart Cath and Coronary Angiography;  Surgeon: Deatrice DELENA Cage, MD;  Location: [facility];  Service: Cardiovascular;  Laterality: N/A;   MOUTH SURGERY     skin cancer eyelid  2012   TONSILLECTOMY     Social History   Social History Narrative   Wellsite geologist.     Immunization History  Administered Date(s) Administered   Fluad Quad(high Dose 65+) 10/16/2020, 07/24/2022   INFLUENZA, HIGH DOSE SEASONAL PF 08/11/2016,  08/27/2017, 08/02/2018, 09/05/2021, 08/31/2023   Influenza Split 07/24/2011, 08/23/2014   Influenza Whole 08/23/2004, 07/24/2009, 08/06/2010, 08/05/2012   Influenza,inj,Quad PF,6+ Mos 08/16/2013,  08/07/2015   Influenza-Unspecified 09/05/2021   PFIZER(Purple Top)SARS-COV-2 Vaccination 12/15/2019, 01/05/2020, 10/25/2020   Pneumococcal Conjugate-13 08/24/2014   Pneumococcal Polysaccharide-23 08/20/2008   Respiratory Syncytial Virus Vaccine,Recomb Aduvanted(Arexvy) 12/30/2022   Td 03/20/2003, 02/28/2013, 04/21/2023   Tdap 03/15/2024   Zoster, Live 09/04/2015     Objective: Vital Signs: There were no vitals taken for this visit.   Physical Exam   Musculoskeletal Exam: ***  CDAI Exam: CDAI Score: -- Patient Global: --; Provider Global: -- Swollen: --; Tender: -- Joint Exam 11/07/2024   No joint exam has been documented for this visit   There is currently no information documented on the homunculus. Go to the Rheumatology activity and complete the homunculus joint exam.  Investigation: No additional findings.  Imaging: No results found.  Recent Labs: Lab Results  Component Value Date   WBC 8.8 05/29/2024   HGB 10.9 (L) 05/29/2024   PLT 463 (H) 05/29/2024   NA 136 07/21/2024   K 5.0 07/21/2024   CL 100 07/21/2024   CO2 19 (L) 07/21/2024   GLUCOSE 85 07/21/2024   BUN 26 07/21/2024   CREATININE 0.97 07/21/2024   BILITOT 0.4 05/29/2024   ALKPHOS 41 05/29/2024   AST 18 05/29/2024   ALT 15 05/29/2024   PROT 6.5 05/29/2024   ALBUMIN 3.6 05/29/2024   CALCIUM  9.7 07/21/2024   GFRAA 71 02/02/2020    Speciality Comments: No specialty comments available.  Procedures:  No procedures performed Allergies: Antihistamines, chlorpheniramine-type; Atenolol; Cefuroxime  axetil; Ciprofloxacin; Co q 10 [coenzyme q10]; Crestor  [rosuvastatin  calcium ]; Dextromethorphan-guaifenesin; Diflucan  [fluconazole ]; Elavil  [amitriptyline ]; Epinephrine ; Methenamine ; Ramipril; Sulfamethoxazole-trimethoprim ; and Vitamin d  analogs   Assessment / Plan:     Visit Diagnoses: No diagnosis found.  ***  Orders: No orders of the defined types were placed in this encounter.  No orders of the  defined types were placed in this encounter.    Follow-Up Instructions: No follow-ups on file.   Tenasia Aull M Dea Bitting, CMA  Note - This record has been created using Animal nutritionist.  Chart creation errors have been sought, but may not always  have been located. Such creation errors do not reflect on  the standard of medical care.

## 2024-11-07 ENCOUNTER — Ambulatory Visit: Attending: Internal Medicine | Admitting: Internal Medicine

## 2024-11-07 ENCOUNTER — Encounter: Payer: Self-pay | Admitting: Internal Medicine

## 2024-11-07 VITALS — BP 100/53 | HR 73 | Temp 97.5°F | Resp 16 | Ht 64.5 in | Wt 167.0 lb

## 2024-11-07 DIAGNOSIS — M059 Rheumatoid arthritis with rheumatoid factor, unspecified: Secondary | ICD-10-CM | POA: Diagnosis present

## 2024-11-07 DIAGNOSIS — R300 Dysuria: Secondary | ICD-10-CM

## 2024-11-07 DIAGNOSIS — Z79899 Other long term (current) drug therapy: Secondary | ICD-10-CM | POA: Diagnosis present

## 2024-11-07 MED ORDER — METHOTREXATE SODIUM CHEMO INJECTION 50 MG/2ML: 12.5000 mg | INTRAMUSCULAR | 2 refills | Status: AC

## 2024-11-07 NOTE — Progress Notes (Unsigned)
 Cardiology Office Note    Date:  11/08/2024   ID:  [name], DOB [birth year], MRN [id number]  PCP:  Randeen Laine LABOR, MD  Cardiologist:  Deatrice Cage, MD  Electrophysiologist:  None   Chief Complaint: Follow up  History of Present Illness:   Tammy Manning is a 89 y.o. female with history of PAF, aortic valve insufficiency, HTN with intolerance to multiple antihypertensive medications, HLD, normal coronary arteries by LHC in 2018, grade 1 diastolic dysfunction, mild aortic root dilatation,  B12 deficiency, and osteoarthritis who presents for follow-up of A-fib.   Cardiac cath in 02/2017 showed normal coronary arteries and normal LV systolic function.  Abdominal aortogram showed no evidence of renal artery stenosis.  Echo from 04/2018 showed an EF of 55 to 60%, mild LVH, mild aortic valve regurgitation, calcified mitral annulus, mildly dilated left atrium and right ventricle.  Echo in 02/2020 demonstrated normal LV systolic function, trivial aortic insufficiency, and a mildly dilated aortic root measuring 39 mm.  In 04/2021, she was evaluated in the ED with lower extremity edema, fatigue, and palpitations and found to be in A-fib with RVR.  She converted to sinus rhythm on diltiazem  and was noted to be mildly hypokalemic.  Echo in 04/2021 demonstrated an EF of 60 to 65%, no regional wall motion abnormalities, mild LVH, grade 1 diastolic dysfunction, normal RV systolic function and ventricular cavity size, normal PASP, mildly dilated left atrium, mild aortic valve sclerosis without evidence of stenosis, and an estimated right atrial pressure of 3 mmHg.  Aortic root noted to be normal in size and structure.  She had recurrent A-fib in mid 05/2021 with conversion to sinus rhythm with IV diltiazem .  In the setting of increased palpitations she underwent Zio patch monitoring in 06/2022 that showed a predominant rhythm of sinus with 1 run of NSVT lasting 7 beats, and 13 episodes of SVT lasting up to 7  beats.  She had COVID in 07/2023 complicated by significant shortness of breath and fatigue thereafter.  Echo in 09/2023 demonstrated normal LV systolic function with an EF of 60 to 65%, no regional wall motion abnormalities, mild LVH, grade 1 diastolic dysfunction, normal RV systolic function and ventricular cavity size, mild mitral regurgitation, mild aortic insufficiency, aortic valve sclerosis without evidence of stenosis, and a normal CVP.  She was seen in 06/2024 noting worsening lower extremity swelling felt to have a component of chronic venous insufficiency with recommendation to elevate legs and use support stockings as well as addition of furosemide .  She was subsequently evaluated in the ER for a leg wound and was prescribed clindamycin .  She was most recently seen in the office in 07/2024 noting improvement in lower extremity edema with no changes in cardiac pharmacotherapy indicated at that time.  She comes in accompanied by her husband today and is doing well from a cardiac perspective, without symptoms of angina or cardiac decompensation.  No palpitations, dizziness, presyncope, or syncope.  No significant lower extremity swelling or progressive orthopnea.  She most recently had a mechanical fall in the summer 2025, tripping over her husband's feet.  She did not hit her head or suffer LOC.  No falls since her last visit in our office in 07/2024.  No hematochezia or melena.  She does note intermittent epistaxis over the past month and is currently established with ENT.  Otherwise, she does not have any acute cardiac concerns at this time.   Labs independently reviewed: 06/2024 - BUN 26, serum creatinine 0.97,  potassium 5.0 05/2024 - Hgb 10.9, PLT 463, albumin 3.6, AST/ALT normal 01/2024 - A1c 6.0, TC 229, TG 150, HDL 88, LDL 111, TSH normal  Past Medical History:  Diagnosis Date   A-fib (HCC)    Anginal pain    Aortic insufficiency    a. 02/2020 Echo: EF 60-65%, no rwma, Gr1 DD, nl RV fxn,  mildly dil LA, triv AI, Asc Ao 39mm; b. 04/2021 Echo: EF 60-65%, no rwma, GrI DD, nl RV fxn, mildly dil LA, AI not visualized. Mild-mod AoV sclerosis w/o stenosis.   Arthritis    Knees   B12 deficiency    Mild   Bucket-handle tear of lateral meniscus of left knee as current injury 12/30/2012   Cancer (HCC)    Skin, basal cell on nose and eyelid   Colitis    COVID    DJD (degenerative joint disease)    Low back   Dyspepsia    GERD (gastroesophageal reflux disease)    Heart murmur    Heart palpitations    History of cardiac cath    a. 02/2017 Cath: Nl cors. Nl LV fxn. Abd Aogram w/o RAS.   HOH (hard of hearing)    Hyperlipidemia    Hypertension    IBS (irritable bowel syndrome)    Lactose intolerance    Mixed incontinence    PONV (postoperative nausea and vomiting)    Urinary tract infection    Vulvar irritation    from urine pad, uses Triamcinolone  oint.    Past Surgical History:  Procedure Laterality Date   ABDOMINAL AORTOGRAM N/A 03/15/2017   Procedure: Abdominal Aortogram;  Surgeon: Deatrice DELENA Cage, MD;  Location: [facility];  Service: Cardiovascular;  Laterality: N/A;   ABDOMINAL HYSTERECTOMY  1982   Large fibroids and bleeding   APPENDECTOMY     BILATERAL SALPINGOOPHORECTOMY  1986   BONE GRAFT HIP ILIAC CREST     To Left arm   BREAST REDUCTION SURGERY     BREAST SURGERY     CARDIAC CATHETERIZATION     CATARACT EXTRACTION W/PHACO Left 07/07/2016   Procedure: CATARACT EXTRACTION PHACO AND INTRAOCULAR LENS PLACEMENT (IOC);  Surgeon: Elsie Carmine, MD;  Location: [facility];  Service: Ophthalmology;  Laterality: Left;  US  01:10 AP% 18.3 CDE 12.91 Fluid pak lot # [id number]   CATARACT EXTRACTION W/PHACO Right 07/28/2016   Procedure: CATARACT EXTRACTION PHACO AND INTRAOCULAR LENS PLACEMENT (IOC);  Surgeon: Elsie Carmine, MD;  Location: [facility];  Service: Ophthalmology;  Laterality: Right;  US  01:26 AP% 18.5 CDE 16.12 Fluid pack lot # [id number]   COLONOSCOPY   07/2016   Dr. Kemper   COLONOSCOPY WITH PROPOFOL  N/A 04/01/2023   Procedure: COLONOSCOPY WITH PROPOFOL ;  Surgeon: Unk Corinn Skiff, MD;  Location: [facility];  Service: Gastroenterology;  Laterality: N/A;   DILATION AND CURETTAGE OF UTERUS     miscarragesx2   ESOPHAGOGASTRODUODENOSCOPY     Polyps   HEMORRHOID SURGERY  2006   KNEE ARTHROSCOPY WITH LATERAL MENISECTOMY Left 12/30/2012   Procedure: KNEE ARTHROSCOPY WITH LATERAL MENISECTOMY;  Surgeon: Fonda SHAUNNA Olmsted, MD;  Location: [facility];  Service: Orthopedics;  Laterality: Left;  LEFT KNEE SCOPE LATERAL MENISCECTOMY   LEFT HEART CATH AND CORONARY ANGIOGRAPHY N/A 03/15/2017   Procedure: Left Heart Cath and Coronary Angiography;  Surgeon: Deatrice DELENA Cage, MD;  Location: [facility];  Service: Cardiovascular;  Laterality: N/A;   MOUTH SURGERY     skin cancer eyelid  2012  TONSILLECTOMY      Current Medications: Active Medications[1]  Allergies:   Antihistamines, chlorpheniramine-type; Atenolol; Cefuroxime  axetil; Ciprofloxacin; Co q 10 [coenzyme q10]; Crestor  [rosuvastatin  calcium ]; Dextromethorphan-guaifenesin; Diflucan  [fluconazole ]; Elavil  [amitriptyline ]; Epinephrine ; Methenamine ; Ramipril; Sulfamethoxazole-trimethoprim ; Tramadol; and Vitamin d  analogs   Social History   Socioeconomic History   Marital status: Married    Spouse name: Not on file   Number of children: 2   Years of education: Not on file   Highest education level: Not on file  Occupational History   Occupation: It Trainer: RETIRED  Tobacco Use   Smoking status: Former    Current packs/day: 0.00    Average packs/day: 1 pack/day for 12.0 years (12.0 ttl pk-yrs)    Types: Cigarettes    Start date: 11/24/1951    Quit date: 11/24/1963    Years since quitting: 61.0    Passive exposure: Never   Smokeless tobacco: Never  Vaping Use   Vaping status: Never Used  Substance and Sexual Activity   Alcohol use: No     Alcohol/week: 0.0 standard drinks of alcohol   Drug use: No   Sexual activity: Not Currently  Other Topics Concern   Not on file  Social History Narrative   Wellsite geologist.     Social Drivers of Health   Tobacco Use: Medium Risk (11/08/2024)   Patient History    Smoking Tobacco Use: Former    Smokeless Tobacco Use: Never    Passive Exposure: Never  Physicist, Medical Strain: Low Risk (12/28/2023)   Overall Financial Resource Strain (CARDIA)    Difficulty of Paying Living Expenses: Not hard at all  Food Insecurity: No Food Insecurity (12/28/2023)   Hunger Vital Sign    Worried About Running Out of Food in the Last Year: Never true    Ran Out of Food in the Last Year: Never true  Transportation Needs: No Transportation Needs (12/28/2023)   PRAPARE - Administrator, Civil Service (Medical): No    Lack of Transportation (Non-Medical): No  Physical Activity: Insufficiently Active (12/28/2023)   Exercise Vital Sign    Days of Exercise per Week: 7 days    Minutes of Exercise per Session: 20 min  Stress: No Stress Concern Present (12/28/2023)   [employer] of Occupational Health - Occupational Stress Questionnaire    Feeling of Stress : Only a little  Social Connections: Moderately Isolated (12/28/2023)   Social Connection and Isolation Panel    Frequency of Communication with Friends and Family: More than three times a week    Frequency of Social Gatherings with Friends and Family: More than three times a week    Attends Religious Services: Never    Database Administrator or Organizations: No    Attends Banker Meetings: Never    Marital Status: Married  Depression (PHQ2-9): Low Risk (09/20/2024)   Depression (PHQ2-9)    PHQ-2 Score: 0  Alcohol Screen: Low Risk (12/28/2023)   Alcohol Screen    Last Alcohol Screening Score (AUDIT): 0  Housing: Unknown (12/28/2023)   Housing Stability Vital Sign    Unable to Pay for Housing in the Last Year: No    Number of Times  Moved in the Last Year: Not on file    Homeless in the Last Year: No  Utilities: Not At Risk (12/28/2023)   AHC Utilities    Threatened with loss of utilities: No  Health Literacy: Adequate Health Literacy (12/28/2023)   B1300 Health Literacy  Frequency of need for help with medical instructions: Never     Family History:  The patient's family history includes Alzheimer's disease in her sister; Breast cancer in her daughter; Cancer in her brother, mother, paternal aunt, and paternal aunt; Diabetes in her brother, brother, brother, sister, and sister; Esophageal cancer in her brother; Heart disease in her father; Hypothyroidism in her sister; Leukemia in her brother.  ROS:   12-point review of systems is negative unless otherwise noted in the HPI.   EKGs/Labs/Other Studies Reviewed:    Studies reviewed were summarized above. The additional studies were reviewed today:  2D echo 09/27/2023: 1. Left ventricular ejection fraction, by estimation, is 60 to 65%. The  left ventricle has normal function. The left ventricle has no regional  wall motion abnormalities. There is mild left ventricular hypertrophy.  Left ventricular diastolic parameters  are consistent with Grade I diastolic dysfunction (impaired relaxation).  The average left ventricular global longitudinal strain is -20.8 %. The  global longitudinal strain is normal.   2. Right ventricular systolic function is normal. The right ventricular  size is normal.   3. The mitral valve is degenerative. Mild mitral valve regurgitation.  Moderate mitral annular calcification.   4. The aortic valve is calcified. Aortic valve regurgitation is mild.  Aortic valve sclerosis/calcification is present, without any evidence of  aortic stenosis. Aortic valve area, by VTI measures 2.57 cm. Aortic valve  mean gradient measures 7.0 mmHg.  Aortic valve Vmax measures 1.75 m/s.   5. The inferior vena cava is normal in size with greater than 50%   respiratory variability, suggesting right atrial pressure of 3 mmHg.  __________  Zio patch 06/2022: Patient had a min HR of 55 bpm, max HR of 190 bpm, and avg HR of 75 bpm.  Predominant underlying rhythm was Sinus Rhythm.  1 run of Ventricular Tachycardia occurred lasting 7 beats with a max rate of 150 bpm (avg 120 bpm).  13 Supraventricular Tachycardia runs occurred, the run with the fastest interval lasting 4 beats with a max rate of 190 bpm, the longest lasting 7 beats with an avg rate of 93 bpm.  __________  2D echo 04/29/2021: 1. Left ventricular ejection fraction, by estimation, is 60 to 65%. The  left ventricle has normal function. The left ventricle has no regional  wall motion abnormalities. There is mild left ventricular hypertrophy.  Left ventricular diastolic parameters  are consistent with Grade I diastolic dysfunction (impaired relaxation).   2. Right ventricular systolic function is normal. The right ventricular  size is normal. There is normal pulmonary artery systolic pressure.   3. Left atrial size was mildly dilated.   4. The mitral valve is normal in structure. No evidence of mitral valve  regurgitation. No evidence of mitral stenosis.   5. The aortic valve is normal in structure. Aortic valve regurgitation is  not visualized. Mild to moderate aortic valve sclerosis/calcification is  present, without any evidence of aortic stenosis.   6. The inferior vena cava is normal in size with greater than 50%  respiratory variability, suggesting right atrial pressure of 3 mmHg. __________    2D echo 03/14/2020: 1. Left ventricular ejection fraction, by estimation, is 60 to 65%. The  left ventricle has normal function. The left ventricle has no regional  wall motion abnormalities. Left ventricular diastolic parameters are  consistent with Grade I diastolic  dysfunction (impaired relaxation).   2. Right ventricular systolic function is normal. The right ventricular  size is  normal. There is mildly elevated pulmonary artery systolic  pressure.   3. Left atrial size was mildly dilated.   4. The mitral valve is grossly normal. No evidence of mitral valve  regurgitation.   5. The aortic valve is tricuspid. Aortic valve regurgitation is trivial.  Mild aortic valve sclerosis is present, with no evidence of aortic valve  stenosis.   6. Aortic dilatation noted. There is mild dilatation of the ascending  aorta measuring 39 mm.   7. The inferior vena cava is normal in size with greater than 50%  respiratory variability, suggesting right atrial pressure of 3 mmHg. __________   2D echo 05/10/2018: - Left ventricle: The cavity size was normal. Wall thickness was    increased in a pattern of mild LVH. Systolic function was normal.    The estimated ejection fraction was in the range of 55% to 60%.    Wall motion was normal; there were no regional wall motion    abnormalities. Doppler parameters are consistent with abnormal    left ventricular relaxation (grade 1 diastolic dysfunction).    Doppler parameters are consistent with high ventricular filling    pressure.  - Aortic valve: There was mild regurgitation.  - Mitral valve: Calcified annulus.  - Left atrium: The atrium was mildly dilated.  - Right ventricle: The cavity size was mildly dilated. Systolic    function was normal. __________   2D echo 02/18/2018: - Left ventricle: The cavity size was normal. Systolic function was    normal. The estimated ejection fraction was in the range of 60%    to 65%. Wall motion was normal; there were no regional wall    motion abnormalities. Doppler parameters are consistent with    abnormal left ventricular relaxation (grade 1 diastolic    dysfunction).  - Aortic valve: There was mild regurgitation.  - Mitral valve: There was mild regurgitation.  - Left atrium: The atrium was normal in size.  - Right ventricle: Systolic function was normal.  - Pulmonary arteries: Systolic  pressure was within the normal    range. __________   LHC/abdominal aortogram 02/2017: The left ventricular systolic function is normal. LV end diastolic pressure is mildly elevated. The left ventricular ejection fraction is 55-65% by visual estimate.   1. Normal coronary arteries. 2. Normal LV systolic function and mildly elevated left ventricular end-diastolic pressure.  3. Mildly calcified aortic valve with no evidence of significant stenosis. 4. Abdominal aortogram was performed to evaluate for uncontrolled hypertension. It showed no evidence of renal artery stenosis, no evidence of aortic aneurysm or significant iliac disease.   Recommendations: Recommend blood pressure control. Uncontrolled hypertension is likely the culprit for her symptoms. __________   2D echo 09/2015: - Left ventricle: The cavity size was normal. There was mild    concentric hypertrophy. Systolic function was normal. The    estimated ejection fraction was in the range of 60% to 65%. Wall    motion was normal; there were no regional wall motion    abnormalities. Doppler parameters are consistent with abnormal    left ventricular relaxation (grade 1 diastolic dysfunction).  - Aortic valve: There was mild regurgitation.  - Mitral valve: Mildly calcified annulus. There was mild    regurgitation.  - Left atrium: The atrium was mildly dilated.  - Pulmonary arteries: Systolic pressure was within the normal    range.     EKG:  EKG is ordered today.  The EKG ordered today demonstrates NSR, 78 bpm, minimal  criteria for LVH, no acute ST-T changes, consistent with prior tracing  Recent Labs: 01/27/2024: TSH 0.85 04/19/2024: Pro B Natriuretic peptide (BNP) 77.0 05/29/2024: ALT 15; Hemoglobin 10.9; Platelets 463 07/21/2024: BUN 26; Creatinine, Ser 0.97; Potassium 5.0; Sodium 136  Recent Lipid Panel    Component Value Date/Time   CHOL 229 (H) 01/27/2024 1135   CHOL 196 02/02/2020 1348   TRIG 150.0 (H) 01/27/2024  1135   HDL 88.30 01/27/2024 1135   HDL 80 02/02/2020 1348   CHOLHDL 3 01/27/2024 1135   VLDL 30.0 01/27/2024 1135   LDLCALC 111 (H) 01/27/2024 1135   LDLCALC 93 02/02/2020 1348   LDLDIRECT 106 (H) 02/02/2020 1348   LDLDIRECT 105.9 02/08/2013 1050    PHYSICAL EXAM:    VS:  BP 130/60   Pulse 78   Ht 5' (1.524 m)   Wt 167 lb 12.8 oz (76.1 kg)   SpO2 98%   BMI 32.77 kg/m   BMI: Body mass index is 32.77 kg/m.  Physical Exam Constitutional:      Appearance: She is well-developed.  HENT:     Head: Normocephalic and atraumatic.  Eyes:     General:        Right eye: No discharge.        Left eye: No discharge.  Cardiovascular:     Rate and Rhythm: Normal rate and regular rhythm.     Heart sounds: S1 normal and S2 normal. Heart sounds not distant. No midsystolic click and no opening snap. Murmur heard.     Systolic murmur is present with a grade of 2/6 at the upper right sternal border.     No friction rub.  Pulmonary:     Effort: Pulmonary effort is normal. No respiratory distress.     Breath sounds: Normal breath sounds. No decreased breath sounds, wheezing, rhonchi or rales.  Musculoskeletal:     Cervical back: Normal range of motion.     Comments: Trivial bilateral pretibial edema along the ankles.  Skin:    General: Skin is warm and dry.     Nails: There is no clubbing.  Neurological:     Mental Status: She is alert and oriented to person, place, and time.  Psychiatric:        Speech: Speech normal.        Behavior: Behavior normal.        Thought Content: Thought content normal.        Judgment: Judgment normal.     Wt Readings from Last 3 Encounters:  11/08/24 167 lb 12.8 oz (76.1 kg)  11/07/24 167 lb (75.8 kg)  09/20/24 166 lb 8 oz (75.5 kg)     ASSESSMENT & PLAN:   PAF: Maintaining sinus rhythm on Cardizem  CD 180 mg daily.  CHA2DS2-VASc at least 4 (HTN, age x 2, sex category).  Remains on apixaban  5 mg twice daily and has not been reduced dosing  criteria.  Aortic valve sclerosis with aortic insufficiency: Aortic root and ascending aorta documented to be structurally normal with no evidence of dilatation by echo in 09/2023.  Monitor with periodic echo.  Diastolic dysfunction: Euvolemic and well compensated.  Continued optimal blood pressure control.  Remains on low-dose furosemide  20 mg.  Lower extremity edema: Stable.  Recommend continued leg elevation and compression socks along with low-dose furosemide  with most recent labs showing stable renal function.  HTN: Blood pressure is well-controlled in the office today.  She remains on Cardizem  CD 180 mg and furosemide  20 mg  daily.  Recent labs stable.  HLD: No evidence of CAD by cardiac cath in 2018.  LDL 111 in 01/2024.  Epistaxis: Likely exacerbated by dry ear in the winter months and apixaban  use.  Recommend she follow-up with ENT for further evaluation and possible cauterization.     Disposition: F/u with Dr. Darron or an APP in 6 months.   Medication Adjustments/Labs and Tests Ordered: Current medicines are reviewed at length with the patient today.  Concerns regarding medicines are outlined above. Medication changes, Labs and Tests ordered today are summarized above and listed in the Patient Instructions accessible in Encounters.   Signed, Bernardino Bring, PA-C 11/08/2024 4:57 PM     [facility] [place] [address] [place], [place] [address] [phone]     [1]  Current Meds  Medication Sig   atorvastatin  (LIPITOR) 10 MG tablet Take 1 tablet (10 mg total) by mouth daily.   Calcium  Carbonate-Vitamin D  (CALCIUM  600+D PO) Take 1 tablet by mouth daily.    clorazepate  (TRANXENE ) 7.5 MG tablet TAKE ONE TABLET BY MOUTH ONCE DAILY AS NEEDED   cyanocobalamin  (,VITAMIN B-12,) 1000 MCG/ML injection Inject 1,000 mcg into the muscle every 30 (thirty) days.    diltiazem  (CARDIZEM  CD) 180 MG 24 hr capsule TAKE 1 CAPSULE BY MOUTH EVERY DAY   ELIQUIS  5 MG  TABS tablet TAKE 1 TABLET BY MOUTH TWICE DAILY   famotidine  (PEPCID ) 20 MG tablet Take 20 mg by mouth 2 (two) times daily as needed for heartburn or indigestion.   furosemide  (LASIX ) 20 MG tablet TAKE ONE TABLET BY MOUTH ONCE DAILY   guaiFENesin (MUCINEX) 600 MG 12 hr tablet Take 600 mg by mouth 2 (two) times daily as needed.   hydrocortisone  (ANUSOL -HC) 25 MG suppository Place 1 suppository (25 mg total) rectally 2 (two) times daily. (Patient taking differently: Place 25 mg rectally as needed.)   irbesartan  (AVAPRO ) 150 MG tablet Take 1 tablet (150 mg total) by mouth daily.   methotrexate  50 MG/2ML injection Inject 0.5 mLs (12.5 mg total) into the skin once a week.   triamcinolone  (NASACORT ) 55 MCG/ACT AERO nasal inhaler Place 2 sprays into the nose daily.   trimethoprim  (TRIMPEX ) 100 MG tablet Take 100 mg by mouth daily.   Tuberculin-Allergy  Syringes 27G X 1/2 1 ML MISC Use 1 syringe once weekly to inject methotrexate .

## 2024-11-08 ENCOUNTER — Ambulatory Visit: Attending: Physician Assistant | Admitting: Physician Assistant

## 2024-11-08 ENCOUNTER — Encounter: Payer: Self-pay | Admitting: Physician Assistant

## 2024-11-08 VITALS — BP 130/60 | HR 78 | Ht 60.0 in | Wt 167.8 lb

## 2024-11-08 DIAGNOSIS — E785 Hyperlipidemia, unspecified: Secondary | ICD-10-CM | POA: Diagnosis present

## 2024-11-08 DIAGNOSIS — I10 Essential (primary) hypertension: Secondary | ICD-10-CM | POA: Diagnosis present

## 2024-11-08 DIAGNOSIS — I351 Nonrheumatic aortic (valve) insufficiency: Secondary | ICD-10-CM | POA: Insufficient documentation

## 2024-11-08 DIAGNOSIS — M7989 Other specified soft tissue disorders: Secondary | ICD-10-CM | POA: Diagnosis present

## 2024-11-08 DIAGNOSIS — I358 Other nonrheumatic aortic valve disorders: Secondary | ICD-10-CM | POA: Diagnosis present

## 2024-11-08 DIAGNOSIS — I480 Paroxysmal atrial fibrillation: Secondary | ICD-10-CM | POA: Diagnosis present

## 2024-11-08 DIAGNOSIS — I5189 Other ill-defined heart diseases: Secondary | ICD-10-CM | POA: Diagnosis present

## 2024-11-08 NOTE — Patient Instructions (Signed)
 Medication Instructions:  Your physician recommends that you continue on your current medications as directed. Please refer to the Current Medication list given to you today.   *If you need a refill on your cardiac medications before your next appointment, please call your pharmacy*  Lab Work: No labs ordered today  If you have labs (blood work) drawn today and your tests are completely normal, you will receive your results only by: MyChart Message (if you have MyChart) OR A paper copy in the mail If you have any lab test that is abnormal or we need to change your treatment, we will call you to review the results.  Testing/Procedures: No test ordered today   Follow-Up: At [facility], you and your health needs are our priority.  As part of our continuing mission to provide you with exceptional heart care, our providers are all part of one team.  This team includes your primary Cardiologist (physician) and Advanced Practice Providers or APPs (Physician Assistants and Nurse Practitioners) who all work together to provide you with the care you need, when you need it.  Your next appointment:   6 month(s)  Provider:   Bernardino Bring, PA-C    We recommend signing up for the patient portal called MyChart.  Sign up information is provided on this After Visit Summary.  MyChart is used to connect with patients for Virtual Visits (Telemedicine).  Patients are able to view lab/test results, encounter notes, upcoming appointments, etc.  Non-urgent messages can be sent to your provider as well.   To learn more about what you can do with MyChart, go to [email or web address].

## 2024-11-09 ENCOUNTER — Other Ambulatory Visit: Payer: Self-pay | Admitting: Family Medicine

## 2024-11-09 DIAGNOSIS — F419 Anxiety disorder, unspecified: Secondary | ICD-10-CM

## 2024-11-09 NOTE — Telephone Encounter (Signed)
 Name of Medication:  Clorazepate  Name of Pharmacy:  [facility] Last Fill or Written Date and Quantity:  08/14/24, #30 tab/ 0 refill Last Office Visit and Type: UTI/bug bite 09/20/24 Next Office Visit and Type:  none Last Controlled Substance Agreement Date:  06/30/13 Last UDS:  none

## 2024-11-10 ENCOUNTER — Other Ambulatory Visit

## 2024-11-10 ENCOUNTER — Telehealth: Payer: Self-pay | Admitting: Family Medicine

## 2024-11-10 DIAGNOSIS — Z79899 Other long term (current) drug therapy: Secondary | ICD-10-CM

## 2024-11-10 NOTE — Telephone Encounter (Signed)
 Copied from CRM [id number]. Topic: Clinical - Request for Lab/Test Order >> Nov 10, 2024 12:11 PM Tammy Manning wrote: Reason for CRM: Patient went today to get blood taken for Rheumatology -Tammy Manning Ester, MD... they were unable to draw from her and she was getting uncomfortable. She asked Dr rice if she could get it done at [facility] lab and he printed the orders out. She is wanting to know if she can come there and get it done. Please contact patient

## 2024-11-10 NOTE — Telephone Encounter (Signed)
 Advised we do not do other labs for outside providers but since PCP isn't available to make that call we will do it this one time as long as she brings the [facility] order. Pt verbalized understanding and will be here this afternoon.

## 2024-11-11 LAB — COMPREHENSIVE METABOLIC PANEL WITH GFR
AG Ratio: 1.7 (calc) (ref 1.0–2.5)
ALT: 8 U/L (ref 6–29)
AST: 14 U/L (ref 10–35)
Albumin: 3.6 g/dL (ref 3.6–5.1)
Alkaline phosphatase (APISO): 55 U/L (ref 37–153)
BUN/Creatinine Ratio: 17 (calc) (ref 6–22)
BUN: 19 mg/dL (ref 7–25)
CO2: 26 mmol/L (ref 20–32)
Calcium: 10 mg/dL (ref 8.6–10.4)
Chloride: 104 mmol/L (ref 98–110)
Creat: 1.09 mg/dL — ABNORMAL HIGH (ref 0.60–0.95)
Globulin: 2.1 g/dL (ref 1.9–3.7)
Glucose, Bld: 119 mg/dL — ABNORMAL HIGH (ref 65–99)
Potassium: 5.4 mmol/L — ABNORMAL HIGH (ref 3.5–5.3)
Sodium: 137 mmol/L (ref 135–146)
Total Bilirubin: 0.4 mg/dL (ref 0.2–1.2)
Total Protein: 5.7 g/dL — ABNORMAL LOW (ref 6.1–8.1)
eGFR: 49 mL/min/1.73m2 — ABNORMAL LOW

## 2024-11-13 LAB — CBC WITH DIFFERENTIAL/PLATELET
Absolute Lymphocytes: 1853 {cells}/uL (ref 850–3900)
Absolute Monocytes: 561 {cells}/uL (ref 200–950)
Basophils Absolute: 38 {cells}/uL (ref 0–200)
Basophils Relative: 0.4 %
Eosinophils Absolute: 76 {cells}/uL (ref 15–500)
Eosinophils Relative: 0.8 %
HCT: 31.9 % — ABNORMAL LOW (ref 35.9–46.0)
Hemoglobin: 10.5 g/dL — ABNORMAL LOW (ref 11.7–15.5)
MCH: 33.2 pg — ABNORMAL HIGH (ref 27.0–33.0)
MCHC: 32.9 g/dL (ref 31.6–35.4)
MCV: 100.9 fL (ref 81.4–101.7)
MPV: 9.2 fL (ref 7.5–12.5)
Monocytes Relative: 5.9 %
Neutro Abs: 6973 {cells}/uL (ref 1500–7800)
Neutrophils Relative %: 73.4 %
Platelets: 416 Thousand/uL — ABNORMAL HIGH (ref 140–400)
RBC: 3.16 Million/uL — ABNORMAL LOW (ref 3.80–5.10)
RDW: 14.5 % (ref 11.0–15.0)
Total Lymphocyte: 19.5 %
WBC: 9.5 Thousand/uL (ref 3.8–10.8)

## 2024-11-13 LAB — SEDIMENTATION RATE

## 2024-11-14 ENCOUNTER — Ambulatory Visit: Payer: Self-pay | Admitting: Internal Medicine

## 2024-11-14 NOTE — Progress Notes (Addendum)
 Metabolic panel showed eGFR slightly decreased at 49 from 57 last time. This would be consistent with mild chronic kidney disease, but some variation can come from changes such as hydration status. This could be related to bladder/urology issues also. Blood count was not changed. It is not a problem for continuing the current dose of methotrexate .  Addendum: ESR was not able to be run on the sample. With no major flare of symptoms and plan to continue treatment while transferring care, do not think she needs to get repeat lab draw just for testing this.

## 2024-11-21 ENCOUNTER — Ambulatory Visit (INDEPENDENT_AMBULATORY_CARE_PROVIDER_SITE_OTHER)

## 2024-11-21 DIAGNOSIS — E538 Deficiency of other specified B group vitamins: Secondary | ICD-10-CM | POA: Diagnosis not present

## 2024-11-21 MED ORDER — CYANOCOBALAMIN 1000 MCG/ML IJ SOLN
1000.0000 ug | Freq: Once | INTRAMUSCULAR | Status: AC
  Administered 2024-11-21: 1000 ug via INTRAMUSCULAR

## 2024-11-21 NOTE — Progress Notes (Addendum)
 Per orders of Dr. Laine Balls, injection of B-12 given by Nellie Hummer in Left deltoid Patient tolerated injection well. Patient will make appointment for 1 month

## 2024-11-24 ENCOUNTER — Ambulatory Visit: Payer: Self-pay

## 2024-11-24 NOTE — Telephone Encounter (Signed)
 Will see patient then Agree with ER and UC precautions

## 2024-11-24 NOTE — Telephone Encounter (Signed)
 Please schedule appointment with me for first available slot (also wait list for cancellations)  We will need to talk and get urine and labs  I think last urology appointment was march-if more recent , let me know  I think she takes antibiotic daily to prevent uti at this point   If symptoms worsen I do recommend ER (if severe) or uc

## 2024-11-24 NOTE — Telephone Encounter (Signed)
 FYI Only or Action Required?: Action required by provider: referral request and refused ED.  Patient was last seen in primary care on 09/20/2024 by Randeen Laine LABOR, MD.  Called Nurse Triage reporting Flank Pain.  Symptoms began 2-3 days ago.  Interventions attempted: OTC medications: Tylenol .  Symptoms are: stable.  Triage Disposition: Go to ED Now (or PCP Triage)  Patient/caregiver understands and will follow disposition?: No, refuses disposition                                1. LOCATION: Where does it hurt? (e.g., left, right)     Across lower back, above hips and below ribs 2. ONSET: When did the pain start?     2-3 days ago 3. SEVERITY: How bad is the pain? (e.g., Scale 1-10; mild, moderate, or severe)     Denies pain at this current time, states pain was recently 7-8 4. PATTERN: Does the pain come and go, or is it constant?       Intermittent  5. CAUSE: What do you think is causing the pain?     Kidney/urinary issues  6. OTHER SYMPTOMS:  Do you have any other symptoms? (e.g., fever, abdomen pain, vomiting, leg weakness, burning with urination, blood in urine)     Urinary symptoms at baseline due to ongoing bladder infection, nausea, dark amber urine, low grade fever Denies abdominal pain, denies vomiting  5. RADIATION: Does the pain shoot into your legs or somewhere else?     Localized to back  8. MEDICINES: What have you taken so far for the pain? (e.g., nothing, acetaminophen , NSAIDS)     Tylenol  9. NEUROLOGIC SYMPTOMS: Do you have any weakness, numbness, or problems with bowel/bladder control?     Wears depends at baseline    This RN advised ED. Patient declined and stated she would only go if symptoms got worse. Patient is seeking a referral to a kidney specialist. Please advise.   Copied from CRM [id number]. Topic: Clinical - Red Word Triage >> Nov 24, 2024 11:17 AM Jasmin G wrote: Red Word that prompted transfer to  Nurse Triage: Pt requested to be referred to a kidney specialist as she has been experiencing a lot of back pain.  Reason for Disposition  Patient sounds very sick or weak to the triager  Protocols used: Flank Pain-A-AH

## 2024-11-24 NOTE — Telephone Encounter (Signed)
 This RN called [facility] to notify of ED refusal.

## 2024-11-24 NOTE — Telephone Encounter (Signed)
 Appt scheduled on 11/29/23 (1st available with PCP). Pt still declined to go to UC or ER but I did advise if sxs worsen over the weekend to go to UC at least if she is still declining to go to ER. Pt agreed to go to UC if sxs worsen but either way she still wants to keep appt with PCP on 11/29/23  Pt taking trimpex  and last urologist appt with [facility] was March, has a new pt appt with MacDiarmid on 12/11/24

## 2024-11-28 ENCOUNTER — Encounter: Payer: Self-pay | Admitting: Family Medicine

## 2024-11-28 ENCOUNTER — Ambulatory Visit (INDEPENDENT_AMBULATORY_CARE_PROVIDER_SITE_OTHER): Admitting: Family Medicine

## 2024-11-28 VITALS — BP 98/60 | HR 78 | Temp 97.9°F | Resp 12 | Ht 64.0 in | Wt 163.4 lb

## 2024-11-28 DIAGNOSIS — M059 Rheumatoid arthritis with rheumatoid factor, unspecified: Secondary | ICD-10-CM | POA: Diagnosis not present

## 2024-11-28 DIAGNOSIS — N390 Urinary tract infection, site not specified: Secondary | ICD-10-CM | POA: Diagnosis not present

## 2024-11-28 DIAGNOSIS — D638 Anemia in other chronic diseases classified elsewhere: Secondary | ICD-10-CM | POA: Diagnosis not present

## 2024-11-28 DIAGNOSIS — R7303 Prediabetes: Secondary | ICD-10-CM | POA: Insufficient documentation

## 2024-11-28 DIAGNOSIS — R040 Epistaxis: Secondary | ICD-10-CM | POA: Insufficient documentation

## 2024-11-28 DIAGNOSIS — N1831 Chronic kidney disease, stage 3a: Secondary | ICD-10-CM

## 2024-11-28 DIAGNOSIS — I4891 Unspecified atrial fibrillation: Secondary | ICD-10-CM

## 2024-11-28 DIAGNOSIS — I5032 Chronic diastolic (congestive) heart failure: Secondary | ICD-10-CM | POA: Diagnosis not present

## 2024-11-28 DIAGNOSIS — I10 Essential (primary) hypertension: Secondary | ICD-10-CM

## 2024-11-28 DIAGNOSIS — M549 Dorsalgia, unspecified: Secondary | ICD-10-CM | POA: Diagnosis not present

## 2024-11-28 DIAGNOSIS — R531 Weakness: Secondary | ICD-10-CM | POA: Insufficient documentation

## 2024-11-28 LAB — POC URINALSYSI DIPSTICK (AUTOMATED)
Bilirubin, UA: NEGATIVE
Blood, UA: NEGATIVE
Glucose, UA: NEGATIVE
Ketones, UA: NEGATIVE
Protein, UA: NEGATIVE
Spec Grav, UA: 1.01
Urobilinogen, UA: NEGATIVE U/dL — AB
pH, UA: 6

## 2024-11-28 NOTE — Assessment & Plan Note (Signed)
 Pt c/o this  Thinks it is worsened by arthritis pain   Offered PT referral  Pt declined but may consider later Stressed importance of strength to remain independent   Was able to get up from chair unassisted

## 2024-11-28 NOTE — Assessment & Plan Note (Signed)
 Last hb 10.5 Last GFR 49  Urine culture today  Consider re check after that if acute uti is suspected

## 2024-11-28 NOTE — Assessment & Plan Note (Signed)
 Utd cardiology care  Clinically stable  Continues ibesartan 150 mg daily  Diltiazem  for CHF

## 2024-11-28 NOTE — Assessment & Plan Note (Signed)
 Recently had methotrexate  dose reduced from rheum Clinically stable

## 2024-11-28 NOTE — Assessment & Plan Note (Signed)
 Pt notes some blood in nasal mucous every am  Suspect due to dryness/cracking   Scab noted in left nostril   Recommend humidifier if helpful Also scant amt of aquaphor to nostrils prn   Is also on anticoagulant   Call back and Er precautions noted in detail today

## 2024-11-28 NOTE — Patient Instructions (Addendum)
 Keep up the good fluid intake (more water than tea)   I will send your urine for a culture  If the back pain returns let me know  If any other urinary symptoms, let ms know    See Dr Gaston as planned   For nostril bleeding/cracking- try a little bit of aquaphor over the counter - in each nostril  To keep the tissue from cracking in the dry weather A vaporizer can help also   If you want to do physical therapy for strength in the future let us  know   Blood pressure today is on the low side of normal today  If you get light headed - let the cardiologist know  Be very careful to change position slowly   We may need to re check kidney labs after we get the urine culture back    Take care of yourself

## 2024-11-28 NOTE — Assessment & Plan Note (Signed)
 Urinalysis with leuk today Had a few days of back pain/better now  Also low grade temp / resolved now  Mild bladder tenderness today   Last GFR down below baseline   Ucx pending  Aware she is likely colonized   Continues Trimethoprim  100 mg daily  Est vag cream  Has appointment with new urol Dr Gaston in feb   Call back and Er precautions noted in detail today

## 2024-11-28 NOTE — Assessment & Plan Note (Signed)
 Continues diltiazem  180 mg daily  Eliquis   Overall clinically stable Under cardiology care

## 2024-11-28 NOTE — Assessment & Plan Note (Signed)
 bp is lower than usual  Pt feels tired today  Occational dizziness  BP Readings from Last 1 Encounters:  11/28/24 98/60   No changes needed Most recent labs reviewed  Disc lifstyle change with low sodium diet and exercise  Blood pressure is stable with ibesartan 150 mg (down from 300)  Diltiazem  180 for this and rate control  (HR stable at 78)  Will make cardiology aware  / if need to cut dose of arb

## 2024-11-28 NOTE — Progress Notes (Signed)
 "  Subjective:    Patient ID: Tammy Manning, female    DOB: 01/01/1935, 89 y.o.   MRN: [id number]  HPI  Wt Readings from Last 3 Encounters:  11/28/24 163 lb 6.4 oz (74.1 kg)  11/08/24 167 lb 12.8 oz (76.1 kg)  11/07/24 167 lb (75.8 kg)   28.05 kg/m  Vitals:   11/28/24 1204  BP: 98/60  Pulse: 78  Resp: 12  Temp: 97.9 F (36.6 C)  SpO2: 97%    Pt presents for back pain (? Urinary)  Blood from nose  Feels generally weaker   History of recurrent uti as well as mixed incontinence Some ? Of colonization in the past  Intermittent dysuria in the past   Trimethoprim  100 mg daily for prophylaxis  Estrace  vaginal cream   Used to see Dr Carolee and Dr Twylla for urology  [facility]  Has new appointment with urology Dr Gaston on 1/19 at [facility]   Today here for back pain - low thoracic (one day)  Both sides equally  Now improved but better than what it was  ? What made it better  Was not positional   Urine tends to be amber  Is drinking a lot of fluids (water in am, then tea)  No blood in urine  No change in frequency or urgency    No bladder symptoms  No dysuria   Urinalysis with 3 plus leuk    Is immunocomp due to meds for RA Methotrexate   HTN bp is stable today  No cp or palpitations or headaches or edema  No side effects to medicines  BP Readings from Last 3 Encounters:  11/28/24 98/60  11/08/24 130/60  11/07/24 (!) 100/53    Ibesartan 150 mg daily  Diltiazem  180 mg for this and rate control   Eliquis  for a fib  Used to bruise with her eliquis  - not as much nose   Some blood from nose  Ams -clots No brisk bleeding  Tried a humidifier   Feels overall weaker than she used to be Can get up from chair unassisted  Pain plays a role-knees and hips  Will use a walker some days  Hesitant to try PT due to joint pain  Really wears her out to stand and cook  Does own house work   Rheumatology did recently decrease her  methotrexate  dose    Ckd 3 Lab Results  Component Value Date   NA 137 11/10/2024   K 5.4 (H) 11/10/2024   CO2 26 11/10/2024   GLUCOSE 119 (H) 11/10/2024   BUN 19 11/10/2024   CREATININE 1.09 (H) 11/10/2024   CALCIUM  10.0 11/10/2024   GFR 65.40 05/10/2024   EGFR 49 (L) 11/10/2024   GFRNONAA >60 05/29/2024   Lab Results  Component Value Date   WBC 9.5 11/07/2024   HGB 10.5 (L) 11/07/2024   HCT 31.9 (L) 11/07/2024   MCV 100.9 11/07/2024   PLT 416 (H) 11/07/2024      Last uc in our system E coli on July 2025 Was pan sensitive and treated with keflex    Lab Results  Component Value Date   HGBA1C 6.0 01/27/2024   HGBA1C 5.7 (H) 05/10/2018    Results for orders placed or performed in visit on 11/28/24  POCT Urinalysis Dipstick (Automated)   Collection Time: 11/28/24 12:16 PM  Result Value Ref Range   Color, UA Yellow    Clarity, UA Clear    Glucose, UA Negative Negative   Bilirubin,  UA Negative    Ketones, UA Negative    Spec Grav, UA 1.010 1.010 - 1.025   Blood, UA Negative    pH, UA 6.0 5.0 - 8.0   Protein, UA Negative Negative   Urobilinogen, UA negative (A) 0.2 or 1.0 E.U./dL   Nitrite, UA +    Leukocytes, UA Large (3+) (A) Negative     Patient Active Problem List   Diagnosis Date Noted   Back pain 11/28/2024   Prediabetes 11/28/2024   Nosebleed 11/28/2024   Generalized weakness 11/28/2024   Anemia, chronic disease 11/28/2024   Wound of left upper extremity 09/20/2024   CKD (chronic kidney disease) stage 3, GFR 30-59 ml/min (HCC) 05/10/2024   Hyperkalemia 04/25/2024   Neck pain 03/02/2024   Pain of left scapula 02/23/2024   Rhomboid muscle pain 02/23/2024   Diabetes mellitus screening 01/27/2024   Thrush 01/27/2024   Anxious mood 09/20/2023   Tremor of both hands 09/20/2023   Head injury 04/28/2023   Superficial injury of skin 04/21/2023   Dysuria 04/12/2023   Diverticulosis 03/21/2023   Chronic diastolic heart failure (HCC) 03/13/2023    Obesity (BMI 30-39.9) 03/13/2023   PAF (paroxysmal atrial fibrillation) (HCC) 03/13/2023   Recurrent UTI (urinary tract infection) 02/18/2023   Intertrigo 02/16/2023   Hemorrhoids 02/02/2023   Urinary incontinence 02/02/2023   Dilated aortic root 01/26/2023   Osteopenia 01/26/2023   Multinodular goiter 02/27/2022   Encounter for screening mammogram for breast cancer 01/23/2022   High risk medication use 10/07/2021   Atrial fibrillation with RVR (HCC) 04/28/2021   Hypertrophic toenail 04/22/2021   Seropositive rheumatoid arthritis (HCC) 11/25/2020   Bilateral hand pain 11/25/2020   Bilateral shoulder pain 10/16/2020   Joint pain 10/16/2020   Estrogen deficiency 04/30/2020   Dry mouth 03/15/2020   Osteoarthritis of right hip 01/02/2020   Right thyroid  nodule 05/12/2018   History of TIA (transient ischemic attack) 05/09/2018   Aortic insufficiency 09/10/2015   Stress reaction 09/08/2015   Encounter for Medicare annual wellness exam 02/28/2013   Bucket-handle tear of lateral meniscus of left knee as current injury 12/30/2012   Episodic atrial fibrillation (HCC) 11/17/2012   Hearing loss of both ears 01/14/2012   B12 deficiency 03/17/2010   GERD (gastroesophageal reflux disease) 03/17/2010   POSTMENOPAUSAL STATUS 08/20/2008   Essential hypertension 07/19/2007   Hyperlipidemia 06/24/2007   IBS 06/24/2007   DERMATITIS, ATOPIC 06/24/2007   SKIN CANCER, HX OF 06/24/2007   Past Medical History:  Diagnosis Date   A-fib (HCC)    Anginal pain    Aortic insufficiency    a. 02/2020 Echo: EF 60-65%, no rwma, Gr1 DD, nl RV fxn, mildly dil LA, triv AI, Asc Ao 39mm; b. 04/2021 Echo: EF 60-65%, no rwma, GrI DD, nl RV fxn, mildly dil LA, AI not visualized. Mild-mod AoV sclerosis w/o stenosis.   Arthritis    Knees   B12 deficiency    Mild   Bucket-handle tear of lateral meniscus of left knee as current injury 12/30/2012   Cancer (HCC)    Skin, basal cell on nose and eyelid   Colitis     COVID    DJD (degenerative joint disease)    Low back   Dyspepsia    GERD (gastroesophageal reflux disease)    Heart murmur    Heart palpitations    History of cardiac cath    a. 02/2017 Cath: Nl cors. Nl LV fxn. Abd Aogram w/o RAS.   HOH (hard of hearing)  Hyperlipidemia    Hypertension    IBS (irritable bowel syndrome)    Lactose intolerance    Mixed incontinence    PONV (postoperative nausea and vomiting)    Urinary tract infection    Vulvar irritation    from urine pad, uses Triamcinolone  oint.   Past Surgical History:  Procedure Laterality Date   ABDOMINAL AORTOGRAM N/A 03/15/2017   Procedure: Abdominal Aortogram;  Surgeon: Deatrice DELENA Cage, MD;  Location: [facility];  Service: Cardiovascular;  Laterality: N/A;   ABDOMINAL HYSTERECTOMY  1982   Large fibroids and bleeding   APPENDECTOMY     BILATERAL SALPINGOOPHORECTOMY  1986   BONE GRAFT HIP ILIAC CREST     To Left arm   BREAST REDUCTION SURGERY     BREAST SURGERY     CARDIAC CATHETERIZATION     CATARACT EXTRACTION W/PHACO Left 07/07/2016   Procedure: CATARACT EXTRACTION PHACO AND INTRAOCULAR LENS PLACEMENT (IOC);  Surgeon: Elsie Carmine, MD;  Location: [facility];  Service: Ophthalmology;  Laterality: Left;  US  01:10 AP% 18.3 CDE 12.91 Fluid pak lot # [id number]   CATARACT EXTRACTION W/PHACO Right 07/28/2016   Procedure: CATARACT EXTRACTION PHACO AND INTRAOCULAR LENS PLACEMENT (IOC);  Surgeon: Elsie Carmine, MD;  Location: [facility];  Service: Ophthalmology;  Laterality: Right;  US  01:26 AP% 18.5 CDE 16.12 Fluid pack lot # [id number]   COLONOSCOPY  07/2016   Dr. Kemper   COLONOSCOPY WITH PROPOFOL  N/A 04/01/2023   Procedure: COLONOSCOPY WITH PROPOFOL ;  Surgeon: Unk Corinn Skiff, MD;  Location: [facility];  Service: Gastroenterology;  Laterality: N/A;   DILATION AND CURETTAGE OF UTERUS     miscarragesx2   ESOPHAGOGASTRODUODENOSCOPY     Polyps   HEMORRHOID SURGERY  2006   KNEE ARTHROSCOPY WITH LATERAL  MENISECTOMY Left 12/30/2012   Procedure: KNEE ARTHROSCOPY WITH LATERAL MENISECTOMY;  Surgeon: Fonda SHAUNNA Olmsted, MD;  Location: [facility];  Service: Orthopedics;  Laterality: Left;  LEFT KNEE SCOPE LATERAL MENISCECTOMY   LEFT HEART CATH AND CORONARY ANGIOGRAPHY N/A 03/15/2017   Procedure: Left Heart Cath and Coronary Angiography;  Surgeon: Deatrice DELENA Cage, MD;  Location: [facility];  Service: Cardiovascular;  Laterality: N/A;   MOUTH SURGERY     skin cancer eyelid  2012   TONSILLECTOMY     Social History[1] Family History  Problem Relation Age of Onset   Cancer Mother        Colon   Heart disease Father        CAD   Diabetes Sister    Hypothyroidism Sister    Diabetes Sister    Alzheimer's disease Sister    Diabetes Brother    Leukemia Brother    Esophageal cancer Brother    Diabetes Brother    Cancer Paternal Aunt        Breast   Breast cancer Daughter    Cancer Paternal Aunt    Cancer Brother    Diabetes Brother    Allergies[2] Medications Ordered Prior to Encounter[2]  Review of Systems  Constitutional:  Positive for fatigue. Negative for activity change, appetite change, fever and unexpected weight change.  HENT:  Positive for congestion and nosebleeds. Negative for ear discharge, ear pain, rhinorrhea, sinus pressure and sore throat.   Eyes:  Negative for pain, redness and visual disturbance.  Respiratory:  Negative for cough, shortness of breath and wheezing.   Cardiovascular:  Negative for chest pain and palpitations.  Gastrointestinal:  Negative for abdominal pain, blood in stool,  constipation and diarrhea.  Endocrine: Negative for polydipsia and polyuria.  Genitourinary:  Positive for frequency. Negative for difficulty urinating, dysuria, hematuria and urgency.  Musculoskeletal:  Positive for arthralgias. Negative for back pain and myalgias.       Back pain is resolved  Skin:  Negative for pallor and rash.  Allergic/Immunologic: Negative for  environmental allergies.  Neurological:  Positive for weakness. Negative for dizziness, syncope and headaches.  Hematological:  Negative for adenopathy. Does not bruise/bleed easily.  Psychiatric/Behavioral:  Negative for decreased concentration and dysphoric mood. The patient is not nervous/anxious.        Objective:   Physical Exam Constitutional:      General: She is not in acute distress.    Appearance: Normal appearance. She is well-developed and normal weight. She is not ill-appearing or diaphoretic.  HENT:     Head: Normocephalic and atraumatic.     Right Ear: Tympanic membrane and ear canal normal.     Left Ear: Tympanic membrane and ear canal normal.     Nose: Congestion present.     Comments: Scab on floor of left nostril-not actively bleeding   Scant congestion     Mouth/Throat:     Mouth: Mucous membranes are moist.     Pharynx: Oropharynx is clear. No posterior oropharyngeal erythema.  Eyes:     General: No scleral icterus.       Right eye: No discharge.        Left eye: No discharge.     Extraocular Movements: Extraocular movements intact.     Conjunctiva/sclera: Conjunctivae normal.     Pupils: Pupils are equal, round, and reactive to light.  Neck:     Thyroid : No thyromegaly.     Vascular: No carotid bruit or JVD.  Cardiovascular:     Rate and Rhythm: Normal rate.     Heart sounds: Normal heart sounds.     No gallop.  Pulmonary:     Effort: Pulmonary effort is normal. No respiratory distress.     Breath sounds: Normal breath sounds. No stridor. No wheezing, rhonchi or rales.  Abdominal:     General: There is no distension or abdominal bruit.     Palpations: Abdomen is soft.     Tenderness: There is no abdominal tenderness. There is no right CVA tenderness, left CVA tenderness, guarding or rebound.     Comments: Mild suprapubic tenderness w/o fullness   Musculoskeletal:     Cervical back: Normal range of motion and neck supple.     Right lower leg: No  edema.     Left lower leg: No edema.     Comments: No spinal tenderness Baseline rom   Lymphadenopathy:     Cervical: No cervical adenopathy.  Skin:    General: Skin is warm and dry.     Coloration: Skin is not pale.     Findings: No rash.  Neurological:     Mental Status: She is alert.     Coordination: Coordination normal.     Deep Tendon Reflexes: Reflexes are normal and symmetric. Reflexes normal.     Comments: Can get up from chair unassisted   No focal weakness  Psychiatric:        Mood and Affect: Mood normal.           Assessment & Plan:   Problem List Items Addressed This Visit       Cardiovascular and Mediastinum   Essential hypertension   bp is lower than usual  Pt feels tired today  Occational dizziness  BP Readings from Last 1 Encounters:  11/28/24 98/60   No changes needed Most recent labs reviewed  Disc lifstyle change with low sodium diet and exercise  Blood pressure is stable with ibesartan 150 mg (down from 300)  Diltiazem  180 for this and rate control  (HR stable at 78)  Will make cardiology aware  / if need to cut dose of arb       Chronic diastolic heart failure (HCC)   Utd cardiology care  Clinically stable  Continues ibesartan 150 mg daily  Diltiazem  for CHF         Atrial fibrillation with RVR (HCC)   Continues diltiazem  180 mg daily  Eliquis   Overall clinically stable Under cardiology care        Musculoskeletal and Integument   Seropositive rheumatoid arthritis (HCC)   Recently had methotrexate  dose reduced from rheum Clinically stable         Genitourinary   Recurrent UTI (urinary tract infection) - Primary   Urinalysis with leuk today Had a few days of back pain/better now  Also low grade temp / resolved now  Mild bladder tenderness today   Last GFR down below baseline   Ucx pending  Aware she is likely colonized   Continues Trimethoprim  100 mg daily  Est vag cream  Has appointment with new urol Dr  Gaston in feb   Call back and Er precautions noted in detail today         Relevant Orders   POCT Urinalysis Dipstick (Automated) (Completed)   Urine Culture   CKD (chronic kidney disease) stage 3, GFR 30-59 ml/min (HCC)   Last GFR down to 49   ? Of possible uti  Leuk on urinalysis  Culture pending   Encouraged fluid intake as tolerated          Other   Nosebleed   Pt notes some blood in nasal mucous every am  Suspect due to dryness/cracking   Scab noted in left nostril   Recommend humidifier if helpful Also scant amt of aquaphor to nostrils prn   Is also on anticoagulant   Call back and Er precautions noted in detail today        Generalized weakness   Pt c/o this  Thinks it is worsened by arthritis pain   Offered PT referral  Pt declined but may consider later Stressed importance of strength to remain independent   Was able to get up from chair unassisted       Back pain   Brief 1-2 d bilateral lower thoracic area Pt was worried about renal cause /infection   Symptoms are better Exam reassuring  Suspected was muscular (also due to bilateral nature)  Encouraged fluids Ucx pending Instructed to call if symptoms return       Relevant Orders   POCT Urinalysis Dipstick (Automated) (Completed)   Urine Culture   Anemia, chronic disease   Last hb 10.5 Last GFR 49  Urine culture today  Consider re check after that if acute uti is suspected          [1]  Social History Tobacco Use   Smoking status: Former    Current packs/day: 0.00    Average packs/day: 1 pack/day for 12.0 years (12.0 ttl pk-yrs)    Types: Cigarettes    Start date: 11/24/1951    Quit date: 11/24/1963    Years since quitting: 61.0    Passive exposure: Never  Smokeless tobacco: Never  Vaping Use   Vaping status: Never Used  Substance Use Topics   Alcohol use: No    Alcohol/week: 0.0 standard drinks of alcohol   Drug use: No  [2]  Allergies Allergen Reactions    Antihistamines, Chlorpheniramine-Type Other (See Comments)    Reaction:  Makes pt hyper    Atenolol Hypertension   Cefuroxime  Axetil Other (See Comments)    Upset stomach   Ciprofloxacin Other (See Comments)    Upset stomach   Co Q 10 [Coenzyme Q10] Other (See Comments)    Reaction:  GI upset    Crestor  [Rosuvastatin  Calcium ] Other (See Comments)    Reaction:  Myalgias    Dextromethorphan-Guaifenesin Other (See Comments)    Reaction:  Made pt jittery    Diflucan  [Fluconazole ] Other (See Comments)    dizzy   Elavil  [Amitriptyline ]     Felt worse /tearful    Epinephrine  Hives   Methenamine      Tongue discomfort    Ramipril Other (See Comments)    Upset stomach   Sulfamethoxazole-Trimethoprim  Other (See Comments)    Upset stomach   Tramadol Nausea And Vomiting    vomiting   Vitamin D  Analogs Other (See Comments)    Reaction:  GI upset   [2]  Current Outpatient Medications on File Prior to Visit  Medication Sig Dispense Refill   atorvastatin  (LIPITOR) 10 MG tablet Take 1 tablet (10 mg total) by mouth daily. 90 tablet 3   Calcium  Carbonate-Vitamin D  (CALCIUM  600+D PO) Take 1 tablet by mouth daily.      clobetasol  ointment (TEMOVATE ) 0.05 % Apply 1 application topically 2 (two) times daily as needed.     clorazepate  (TRANXENE ) 7.5 MG tablet TAKE ONE TABLET BY MOUTH ONCE DAILY AS NEEDED 30 tablet 0   cyanocobalamin  (,VITAMIN B-12,) 1000 MCG/ML injection Inject 1,000 mcg into the muscle every 30 (thirty) days.      diltiazem  (CARDIZEM  CD) 180 MG 24 hr capsule TAKE 1 CAPSULE BY MOUTH EVERY DAY 90 capsule 3   ELIQUIS  5 MG TABS tablet TAKE 1 TABLET BY MOUTH TWICE DAILY 180 tablet 1   famotidine  (PEPCID ) 20 MG tablet Take 20 mg by mouth 2 (two) times daily as needed for heartburn or indigestion.     furosemide  (LASIX ) 20 MG tablet TAKE ONE TABLET BY MOUTH ONCE DAILY 90 tablet 2   guaiFENesin (MUCINEX) 600 MG 12 hr tablet Take 600 mg by mouth 2 (two) times daily as needed.      hydrocortisone  (ANUSOL -HC) 25 MG suppository Place 1 suppository (25 mg total) rectally 2 (two) times daily. (Patient taking differently: Place 25 mg rectally as needed.) 10 suppository 0   irbesartan  (AVAPRO ) 150 MG tablet Take 1 tablet (150 mg total) by mouth daily. 90 tablet 2   lidocaine  (LIDODERM ) 5 % as needed.     loperamide  (IMODIUM ) 2 MG capsule Take 2 mg by mouth as needed for diarrhea or loose stools.     methotrexate  50 MG/2ML injection Inject 0.5 mLs (12.5 mg total) into the skin once a week. 2 mL 2   trimethoprim  (TRIMPEX ) 100 MG tablet Take 100 mg by mouth daily.     Tuberculin-Allergy  Syringes 27G X 1/2 1 ML MISC Use 1 syringe once weekly to inject methotrexate . 25 each 1   triamcinolone  (NASACORT ) 55 MCG/ACT AERO nasal inhaler Place 2 sprays into the nose daily. (Patient not taking: Reported on 11/28/2024) 1 each 12   No current facility-administered medications on file  prior to visit.   "

## 2024-11-28 NOTE — Assessment & Plan Note (Signed)
 Last GFR down to 49   ? Of possible uti  Leuk on urinalysis  Culture pending   Encouraged fluid intake as tolerated

## 2024-11-28 NOTE — Assessment & Plan Note (Signed)
 Brief 1-2 d bilateral lower thoracic area Pt was worried about renal cause /infection   Symptoms are better Exam reassuring  Suspected was muscular (also due to bilateral nature)  Encouraged fluids Ucx pending Instructed to call if symptoms return

## 2024-11-30 ENCOUNTER — Ambulatory Visit: Payer: Self-pay | Admitting: Family Medicine

## 2024-12-01 LAB — URINE CULTURE
MICRO NUMBER:: 17431930
SPECIMEN QUALITY:: ADEQUATE

## 2024-12-08 ENCOUNTER — Other Ambulatory Visit: Payer: Self-pay | Admitting: Family Medicine

## 2024-12-08 DIAGNOSIS — E7800 Pure hypercholesterolemia, unspecified: Secondary | ICD-10-CM

## 2024-12-11 ENCOUNTER — Ambulatory Visit: Admitting: Urology

## 2024-12-11 ENCOUNTER — Ambulatory Visit (INDEPENDENT_AMBULATORY_CARE_PROVIDER_SITE_OTHER): Admitting: Urology

## 2024-12-11 VITALS — BP 106/58 | HR 80

## 2024-12-11 DIAGNOSIS — R3915 Urgency of urination: Secondary | ICD-10-CM

## 2024-12-11 DIAGNOSIS — N390 Urinary tract infection, site not specified: Secondary | ICD-10-CM

## 2024-12-11 MED ORDER — TRIMETHOPRIM 100 MG PO TABS: 100.0000 mg | ORAL_TABLET | Freq: Every day | ORAL | 3 refills | Status: AC

## 2024-12-11 NOTE — Progress Notes (Signed)
 "  12/11/2024 9:04 AM   Alvaro Gwendlyn 06-05-1935 [id number]  Referring provider: Randeen Laine LABOR, MD [address] [place],  [place] [address]  No chief complaint on file.   HPI: ST: Recurrent urinary tract infections on daily Macrodantin  and d-mannose.  Normal CT scan April 2024.  She was doing well on Macrodantin  but switched to trimethoprim  and methenamine   Patient is infection free doing beautifully on trimethoprim .  Minimal incontinence.  Not affecting quality of life.  Frequency stable.  Very pleased.   PMH: Past Medical History:  Diagnosis Date   A-fib (HCC)    Anginal pain    Aortic insufficiency    a. 02/2020 Echo: EF 60-65%, no rwma, Gr1 DD, nl RV fxn, mildly dil LA, triv AI, Asc Ao 39mm; b. 04/2021 Echo: EF 60-65%, no rwma, GrI DD, nl RV fxn, mildly dil LA, AI not visualized. Mild-mod AoV sclerosis w/o stenosis.   Arthritis    Knees   B12 deficiency    Mild   Bucket-handle tear of lateral meniscus of left knee as current injury 12/30/2012   Cancer (HCC)    Skin, basal cell on nose and eyelid   Colitis    COVID    DJD (degenerative joint disease)    Low back   Dyspepsia    GERD (gastroesophageal reflux disease)    Heart murmur    Heart palpitations    History of cardiac cath    a. 02/2017 Cath: Nl cors. Nl LV fxn. Abd Aogram w/o RAS.   HOH (hard of hearing)    Hyperlipidemia    Hypertension    IBS (irritable bowel syndrome)    Lactose intolerance    Mixed incontinence    PONV (postoperative nausea and vomiting)    Urinary tract infection    Vulvar irritation    from urine pad, uses Triamcinolone  oint.    Surgical History: Past Surgical History:  Procedure Laterality Date   ABDOMINAL AORTOGRAM N/A 03/15/2017   Procedure: Abdominal Aortogram;  Surgeon: Deatrice LABOR Cage, MD;  Location: [facility];  Service: Cardiovascular;  Laterality: N/A;   ABDOMINAL HYSTERECTOMY  1982   Large fibroids and bleeding   APPENDECTOMY     BILATERAL  SALPINGOOPHORECTOMY  1986   BONE GRAFT HIP ILIAC CREST     To Left arm   BREAST REDUCTION SURGERY     BREAST SURGERY     CARDIAC CATHETERIZATION     CATARACT EXTRACTION W/PHACO Left 07/07/2016   Procedure: CATARACT EXTRACTION PHACO AND INTRAOCULAR LENS PLACEMENT (IOC);  Surgeon: Elsie Carmine, MD;  Location: [facility];  Service: Ophthalmology;  Laterality: Left;  US  01:10 AP% 18.3 CDE 12.91 Fluid pak lot # [id number]   CATARACT EXTRACTION W/PHACO Right 07/28/2016   Procedure: CATARACT EXTRACTION PHACO AND INTRAOCULAR LENS PLACEMENT (IOC);  Surgeon: Elsie Carmine, MD;  Location: [facility];  Service: Ophthalmology;  Laterality: Right;  US  01:26 AP% 18.5 CDE 16.12 Fluid pack lot # [id number]   COLONOSCOPY  07/2016   Dr. Kemper   COLONOSCOPY WITH PROPOFOL  N/A 04/01/2023   Procedure: COLONOSCOPY WITH PROPOFOL ;  Surgeon: Unk Corinn Skiff, MD;  Location: [facility];  Service: Gastroenterology;  Laterality: N/A;   DILATION AND CURETTAGE OF UTERUS     miscarragesx2   ESOPHAGOGASTRODUODENOSCOPY     Polyps   HEMORRHOID SURGERY  2006   KNEE ARTHROSCOPY WITH LATERAL MENISECTOMY Left 12/30/2012   Procedure: KNEE ARTHROSCOPY WITH LATERAL MENISECTOMY;  Surgeon: Fonda SHAUNNA Olmsted, MD;  Location: [facility];  Service: Orthopedics;  Laterality: Left;  LEFT KNEE SCOPE LATERAL MENISCECTOMY   LEFT HEART CATH AND CORONARY ANGIOGRAPHY N/A 03/15/2017   Procedure: Left Heart Cath and Coronary Angiography;  Surgeon: Deatrice DELENA Cage, MD;  Location: [facility];  Service: Cardiovascular;  Laterality: N/A;   MOUTH SURGERY     skin cancer eyelid  2012   TONSILLECTOMY      Home Medications:  Allergies as of 12/11/2024       Reactions   Antihistamines, Chlorpheniramine-type Other (See Comments)   Reaction:  Makes pt hyper    Atenolol Hypertension   Cefuroxime  Axetil Other (See Comments)   Upset stomach   Ciprofloxacin Other (See Comments)   Upset stomach   Co Q 10 [coenzyme Q10]  Other (See Comments)   Reaction:  GI upset    Crestor  [rosuvastatin  Calcium ] Other (See Comments)   Reaction:  Myalgias   Dextromethorphan-guaifenesin Other (See Comments)   Reaction:  Made pt jittery    Diflucan  Berke.bound ] Other (See Comments)   dizzy   Elavil  [amitriptyline ]    Felt worse /tearful    Epinephrine  Hives   Methenamine     Tongue discomfort    Ramipril Other (See Comments)   Upset stomach   Sulfamethoxazole-trimethoprim  Other (See Comments)   Upset stomach   Tramadol Nausea And Vomiting   vomiting   Vitamin D  Analogs Other (See Comments)   Reaction:  GI upset         Medication List        Accurate as of December 11, 2024  9:04 AM. If you have any questions, ask your nurse or doctor.          atorvastatin  10 MG tablet Commonly known as: LIPITOR TAKE 1 TABLET BY MOUTH ONCE DAILY   CALCIUM  600+D PO Take 1 tablet by mouth daily.   clobetasol  ointment 0.05 % Commonly known as: TEMOVATE  Apply 1 application topically 2 (two) times daily as needed.   clorazepate  7.5 MG tablet Commonly known as: TRANXENE  TAKE ONE TABLET BY MOUTH ONCE DAILY AS NEEDED   cyanocobalamin  1000 MCG/ML injection Commonly known as: VITAMIN B12 Inject 1,000 mcg into the muscle every 30 (thirty) days.   diltiazem  180 MG 24 hr capsule Commonly known as: CARDIZEM  CD TAKE 1 CAPSULE BY MOUTH EVERY DAY   Eliquis  5 MG Tabs tablet Generic drug: apixaban  TAKE 1 TABLET BY MOUTH TWICE DAILY   famotidine  20 MG tablet Commonly known as: PEPCID  Take 20 mg by mouth 2 (two) times daily as needed for heartburn or indigestion.   furosemide  20 MG tablet Commonly known as: LASIX  TAKE ONE TABLET BY MOUTH ONCE DAILY   guaiFENesin 600 MG 12 hr tablet Commonly known as: MUCINEX Take 600 mg by mouth 2 (two) times daily as needed.   hydrocortisone  25 MG suppository Commonly known as: ANUSOL -HC Place 1 suppository (25 mg total) rectally 2 (two) times daily. What changed:  when to take  this reasons to take this   irbesartan  150 MG tablet Commonly known as: Avapro  Take 1 tablet (150 mg total) by mouth daily.   lidocaine  5 % Commonly known as: LIDODERM  as needed.   loperamide  2 MG capsule Commonly known as: IMODIUM  Take 2 mg by mouth as needed for diarrhea or loose stools.   methotrexate  50 MG/2ML injection Inject 0.5 mLs (12.5 mg total) into the skin once a week.   triamcinolone  55 MCG/ACT Aero nasal inhaler Commonly known as: NASACORT  Place 2 sprays into  the nose daily.   trimethoprim  100 MG tablet Commonly known as: TRIMPEX  Take 100 mg by mouth daily.   Tuberculin-Allergy  Syringes 27G X 1/2 1 ML Misc Use 1 syringe once weekly to inject methotrexate .        Allergies: Allergies[1]  Family History: Family History  Problem Relation Age of Onset   Cancer Mother        Colon   Heart disease Father        CAD   Diabetes Sister    Hypothyroidism Sister    Diabetes Sister    Alzheimer's disease Sister    Diabetes Brother    Leukemia Brother    Esophageal cancer Brother    Diabetes Brother    Cancer Paternal Aunt        Breast   Breast cancer Daughter    Cancer Paternal Aunt    Cancer Brother    Diabetes Brother     Social History:  reports that she quit smoking about 61 years ago. Her smoking use included cigarettes. She started smoking about 73 years ago. She has a 12 pack-year smoking history. She has never been exposed to tobacco smoke. She has never used smokeless tobacco. She reports that she does not drink alcohol and does not use drugs.  ROS:                                        Physical Exam: BP (!) 106/58   Pulse 80   Constitutional:  Alert and oriented, No acute distress. HEENT: [place] AT, moist mucus membranes.  Trachea midline, no masses.   Laboratory Data: Lab Results  Component Value Date   WBC 9.5 11/07/2024   HGB 10.5 (L) 11/07/2024   HCT 31.9 (L) 11/07/2024   MCV 100.9 11/07/2024   PLT 416  (H) 11/07/2024    Lab Results  Component Value Date   CREATININE 1.09 (H) 11/10/2024    No results found for: PSA  No results found for: TESTOSTERONE  Lab Results  Component Value Date   HGBA1C 6.0 01/27/2024    Urinalysis    Component Value Date/Time   COLORURINE STRAW (A) 03/13/2023 0127   APPEARANCEUR Hazy (A) 05/19/2023 0849   LABSPEC 1.014 03/13/2023 0127   PHURINE 5.0 03/13/2023 0127   GLUCOSEU Negative 05/19/2023 0849   HGBUR SMALL (A) 03/13/2023 0127   BILIRUBINUR Negative 11/28/2024 1216   BILIRUBINUR Negative 05/19/2023 0849   KETONESUR NEGATIVE 03/13/2023 0127   PROTEINUR Negative 11/28/2024 1216   PROTEINUR Negative 05/19/2023 0849   PROTEINUR NEGATIVE 03/13/2023 0127   UROBILINOGEN negative (A) 11/28/2024 1216   NITRITE + 11/28/2024 1216   NITRITE Negative 05/19/2023 0849   NITRITE NEGATIVE 03/13/2023 0127   LEUKOCYTESUR Large (3+) (A) 11/28/2024 1216   LEUKOCYTESUR 1+ (A) 05/19/2023 0849   LEUKOCYTESUR TRACE (A) 03/13/2023 0127    Pertinent Imaging:   Assessment & Plan: Trimethoprim  100 mg 90 x 3 sent to pharmacy and I will see in 1 year  1. Recurrent UTI (Primary)  - Urinalysis, Complete  2. Urinary urgency  - Urinalysis, Complete   No follow-ups on file.  Glendia DELENA Elizabeth, MD  [facility] [address], [address] [place], [place] [address] [phone]     [1]  Allergies Allergen Reactions   Antihistamines, Chlorpheniramine-Type Other (See Comments)    Reaction:  Makes pt hyper    Atenolol Hypertension  Cefuroxime  Axetil Other (See Comments)    Upset stomach   Ciprofloxacin Other (See Comments)    Upset stomach   Co Q 10 [Coenzyme Q10] Other (See Comments)    Reaction:  GI upset    Crestor  [Rosuvastatin  Calcium ] Other (See Comments)    Reaction:  Myalgias    Dextromethorphan-Guaifenesin Other (See Comments)    Reaction:  Made pt jittery    Diflucan  [Fluconazole ] Other (See Comments)     dizzy   Elavil  [Amitriptyline ]     Felt worse /tearful    Epinephrine  Hives   Methenamine      Tongue discomfort    Ramipril Other (See Comments)    Upset stomach   Sulfamethoxazole-Trimethoprim  Other (See Comments)    Upset stomach   Tramadol Nausea And Vomiting    vomiting   Vitamin D  Analogs Other (See Comments)    Reaction:  GI upset    "

## 2024-12-25 ENCOUNTER — Ambulatory Visit: Admitting: Urology

## 2024-12-26 ENCOUNTER — Ambulatory Visit

## 2024-12-28 ENCOUNTER — Ambulatory Visit: Payer: Medicare Other

## 2024-12-29 ENCOUNTER — Ambulatory Visit

## 2024-12-29 VITALS — BP 108/70 | HR 83 | Ht 64.0 in | Wt 163.4 lb

## 2024-12-29 DIAGNOSIS — Z0000 Encounter for general adult medical examination without abnormal findings: Secondary | ICD-10-CM

## 2024-12-29 NOTE — Progress Notes (Signed)
 "  Chief Complaint  Patient presents with   Medicare Wellness     Subjective:   Tammy Manning is a 89 y.o. female who presents for a Medicare Annual Wellness Visit.  Visit info / Clinical Intake: Medicare Wellness Visit Type:: Subsequent Annual Wellness Visit Persons participating in visit and providing information:: patient Medicare Wellness Visit Mode:: In-person (required for WTM) Interpreter Needed?: No Pre-visit prep was completed: yes AWV questionnaire completed by patient prior to visit?: no Living arrangements:: lives with spouse/significant other Patient's Overall Health Status Rating: good Typical amount of pain: none Does pain affect daily life?: no Are you currently prescribed opioids?: no  Dietary Habits and Nutritional Risks How many meals a day?: 3 Eats fruit and vegetables daily?: yes Most meals are obtained by: preparing own meals In the last 2 weeks, have you had any of the following?: none Diabetic:: no  Functional Status Activities of Daily Living (to include ambulation/medication): Independent Ambulation: Independent with device- listed below Home Assistive Devices/Equipment: Eyeglasses; Walker (specify Type) Medication Administration: Independent Home Management (perform basic housework or laundry): Independent Manage your own finances?: yes Primary transportation is: driving Concerns about vision?: no *vision screening is required for WTM* Concerns about hearing?: (!) yes Uses hearing aids?: (!) yes Hear whispered voice?: (!) no *in-person visit only*  Fall Screening Falls in the past year?: 0 Number of falls in past year: 0 Was there an injury with Fall?: 0 Fall Risk Category Calculator: 0 Patient Fall Risk Level: Low Fall Risk  Fall Risk Patient at Risk for Falls Due to: No Fall Risks; Impaired balance/gait Fall risk Follow up: Falls evaluation completed; Falls prevention discussed  Home and Transportation Safety: All rugs have  non-skid backing?: N/A, no rugs All stairs or steps have railings?: N/A, no stairs Grab bars in the bathtub or shower?: yes Have non-skid surface in bathtub or shower?: yes Good home lighting?: yes Regular seat belt use?: yes Hospital stays in the last year:: no  Cognitive Assessment Difficulty concentrating, remembering, or making decisions? : no Will 6CIT or Mini Cog be Completed: yes What year is it?: 0 points What month is it?: 0 points Give patient an address phrase to remember (5 components): [address] [place], [place] About what time is it?: 0 points Count backwards from 20 to 1: 0 points Say the months of the year in reverse: 0 points Repeat the address phrase from earlier: 0 points 6 CIT Score: 0 points  Advance Directives (For Healthcare) Does Patient Have a Medical Advance Directive?: Yes Does patient want to make changes to medical advance directive?: Yes (Inpatient - patient requests chaplain consult to change a medical advance directive) Type of Advance Directive: Healthcare Power of [place]; Living will Copy of Healthcare Power of Attorney in Chart?: No - copy requested Copy of Living Will in Chart?: No - copy requested Would patient like information on creating a medical advance directive?: No - Patient declined  Reviewed/Updated  Reviewed/Updated: Reviewed All (Medical, Surgical, Family, Medications, Allergies, Care Teams, Patient Goals)    Allergies (verified) Antihistamines, chlorpheniramine-type; Atenolol; Cefuroxime  axetil; Ciprofloxacin; Co q 10 [coenzyme q10]; Crestor  [rosuvastatin  calcium ]; Dextromethorphan-guaifenesin; Diflucan  [fluconazole ]; Elavil  [amitriptyline ]; Epinephrine ; Methenamine ; Ramipril; Sulfamethoxazole-trimethoprim ; Tramadol; and Vitamin d  analogs   Current Medications (verified) Outpatient Encounter Medications as of 12/29/2024  Medication Sig   atorvastatin  (LIPITOR) 10 MG tablet TAKE 1 TABLET BY MOUTH ONCE DAILY   Calcium   Carbonate-Vitamin D  (CALCIUM  600+D PO) Take 1 tablet by mouth daily.    clobetasol  ointment (TEMOVATE )  0.05 % Apply 1 application topically 2 (two) times daily as needed.   clorazepate  (TRANXENE ) 7.5 MG tablet TAKE ONE TABLET BY MOUTH ONCE DAILY AS NEEDED   cyanocobalamin  (,VITAMIN B-12,) 1000 MCG/ML injection Inject 1,000 mcg into the muscle every 30 (thirty) days.    diltiazem  (CARDIZEM  CD) 180 MG 24 hr capsule TAKE 1 CAPSULE BY MOUTH EVERY DAY   ELIQUIS  5 MG TABS tablet TAKE 1 TABLET BY MOUTH TWICE DAILY   famotidine  (PEPCID ) 20 MG tablet Take 20 mg by mouth 2 (two) times daily as needed for heartburn or indigestion.   furosemide  (LASIX ) 20 MG tablet TAKE ONE TABLET BY MOUTH ONCE DAILY   guaiFENesin (MUCINEX) 600 MG 12 hr tablet Take 600 mg by mouth 2 (two) times daily as needed.   hydrocortisone  (ANUSOL -HC) 25 MG suppository Place 1 suppository (25 mg total) rectally 2 (two) times daily. (Patient taking differently: Place 25 mg rectally as needed.)   irbesartan  (AVAPRO ) 150 MG tablet Take 1 tablet (150 mg total) by mouth daily.   lidocaine  (LIDODERM ) 5 % as needed.   loperamide  (IMODIUM ) 2 MG capsule Take 2 mg by mouth as needed for diarrhea or loose stools.   methotrexate  50 MG/2ML injection Inject 0.5 mLs (12.5 mg total) into the skin once a week.   triamcinolone  (NASACORT ) 55 MCG/ACT AERO nasal inhaler Place 2 sprays into the nose daily.   trimethoprim  (TRIMPEX ) 100 MG tablet Take 1 tablet (100 mg total) by mouth daily.   Tuberculin-Allergy  Syringes 27G X 1/2 1 ML MISC Use 1 syringe once weekly to inject methotrexate .   No facility-administered encounter medications on file as of 12/29/2024.    History: Past Medical History:  Diagnosis Date   A-fib (HCC)    Anginal pain    Aortic insufficiency    a. 02/2020 Echo: EF 60-65%, no rwma, Gr1 DD, nl RV fxn, mildly dil LA, triv AI, Asc Ao 39mm; b. 04/2021 Echo: EF 60-65%, no rwma, GrI DD, nl RV fxn, mildly dil LA, AI not visualized. Mild-mod  AoV sclerosis w/o stenosis.   Arthritis    Knees   B12 deficiency    Mild   Bucket-handle tear of lateral meniscus of left knee as current injury 12/30/2012   Cancer (HCC)    Skin, basal cell on nose and eyelid   Colitis    COVID    DJD (degenerative joint disease)    Low back   Dyspepsia    GERD (gastroesophageal reflux disease)    Heart murmur    Heart palpitations    History of cardiac cath    a. 02/2017 Cath: Nl cors. Nl LV fxn. Abd Aogram w/o RAS.   HOH (hard of hearing)    Hyperlipidemia    Hypertension    IBS (irritable bowel syndrome)    Lactose intolerance    Mixed incontinence    PONV (postoperative nausea and vomiting)    Urinary tract infection    Vulvar irritation    from urine pad, uses Triamcinolone  oint.   Past Surgical History:  Procedure Laterality Date   ABDOMINAL AORTOGRAM N/A 03/15/2017   Procedure: Abdominal Aortogram;  Surgeon: Deatrice DELENA Cage, MD;  Location: [facility];  Service: Cardiovascular;  Laterality: N/A;   ABDOMINAL HYSTERECTOMY  1982   Large fibroids and bleeding   APPENDECTOMY     BILATERAL SALPINGOOPHORECTOMY  1986   BONE GRAFT HIP ILIAC CREST     To Left arm   BREAST REDUCTION SURGERY  BREAST SURGERY     CARDIAC CATHETERIZATION     CATARACT EXTRACTION W/PHACO Left 07/07/2016   Procedure: CATARACT EXTRACTION PHACO AND INTRAOCULAR LENS PLACEMENT (IOC);  Surgeon: Elsie Carmine, MD;  Location: [facility];  Service: Ophthalmology;  Laterality: Left;  US  01:10 AP% 18.3 CDE 12.91 Fluid pak lot # [id number]   CATARACT EXTRACTION W/PHACO Right 07/28/2016   Procedure: CATARACT EXTRACTION PHACO AND INTRAOCULAR LENS PLACEMENT (IOC);  Surgeon: Elsie Carmine, MD;  Location: [facility];  Service: Ophthalmology;  Laterality: Right;  US  01:26 AP% 18.5 CDE 16.12 Fluid pack lot # [id number]   COLONOSCOPY  07/2016   Dr. Kemper   COLONOSCOPY WITH PROPOFOL  N/A 04/01/2023   Procedure: COLONOSCOPY WITH PROPOFOL ;  Surgeon: Unk Corinn Skiff,  MD;  Location: [facility];  Service: Gastroenterology;  Laterality: N/A;   DILATION AND CURETTAGE OF UTERUS     miscarragesx2   ESOPHAGOGASTRODUODENOSCOPY     Polyps   HEMORRHOID SURGERY  2006   KNEE ARTHROSCOPY WITH LATERAL MENISECTOMY Left 12/30/2012   Procedure: KNEE ARTHROSCOPY WITH LATERAL MENISECTOMY;  Surgeon: Fonda SHAUNNA Olmsted, MD;  Location: [facility];  Service: Orthopedics;  Laterality: Left;  LEFT KNEE SCOPE LATERAL MENISCECTOMY   LEFT HEART CATH AND CORONARY ANGIOGRAPHY N/A 03/15/2017   Procedure: Left Heart Cath and Coronary Angiography;  Surgeon: Deatrice DELENA Cage, MD;  Location: [facility];  Service: Cardiovascular;  Laterality: N/A;   MOUTH SURGERY     skin cancer eyelid  2012   TONSILLECTOMY     Family History  Problem Relation Age of Onset   Cancer Mother        Colon   Heart disease Father        CAD   Diabetes Sister    Hypothyroidism Sister    Diabetes Sister    Alzheimer's disease Sister    Diabetes Brother    Leukemia Brother    Esophageal cancer Brother    Diabetes Brother    Cancer Paternal Aunt        Breast   Breast cancer Daughter    Cancer Paternal Aunt    Cancer Brother    Diabetes Brother    Social History   Occupational History   Occupation: It Trainer: RETIRED  Tobacco Use   Smoking status: Former    Current packs/day: 0.00    Average packs/day: 1 pack/day for 12.0 years (12.0 ttl pk-yrs)    Types: Cigarettes    Start date: 11/24/1951    Quit date: 11/24/1963    Years since quitting: 61.1    Passive exposure: Never   Smokeless tobacco: Never  Vaping Use   Vaping status: Never Used  Substance and Sexual Activity   Alcohol use: No    Alcohol/week: 0.0 standard drinks of alcohol   Drug use: No   Sexual activity: Not Currently   Tobacco Counseling Counseling given: Yes  SDOH Screenings   Food Insecurity: No Food Insecurity (12/29/2024)  Housing: Low Risk (12/29/2024)  Transportation Needs:  No Transportation Needs (12/29/2024)  Utilities: Not At Risk (12/29/2024)  Alcohol Screen: Low Risk (12/28/2023)  Depression (PHQ2-9): Low Risk (12/29/2024)  Recent Concern: Depression (PHQ2-9) - Medium Risk (11/28/2024)  Financial Resource Strain: Low Risk (12/28/2023)  Physical Activity: Sufficiently Active (12/29/2024)  Social Connections: Moderately Isolated (12/29/2024)  Stress: No Stress Concern Present (12/29/2024)  Tobacco Use: Medium Risk (12/29/2024)  Health Literacy: Adequate Health Literacy (12/29/2024)   See flowsheets for full screening details  Depression Screen  PHQ 2 & 9 Depression Scale- Over the past 2 weeks, how often have you been bothered by any of the following problems? Little interest or pleasure in doing things: 0 Feeling down, depressed, or hopeless (PHQ Adolescent also includes...irritable): 0 PHQ-2 Total Score: 0 Trouble falling or staying asleep, or sleeping too much: 0 Feeling tired or having little energy: 0 Poor appetite or overeating (PHQ Adolescent also includes...weight loss): 0 Feeling bad about yourself - or that you are a failure or have let yourself or your family down: 0 Trouble concentrating on things, such as reading the newspaper or watching television (PHQ Adolescent also includes...like school work): 0 Moving or speaking so slowly that other people could have noticed. Or the opposite - being so fidgety or restless that you have been moving around a lot more than usual: 2 (sometimes uses a walker) Thoughts that you would be better off dead, or of hurting yourself in some way: 0 PHQ-9 Total Score: 2 If you checked off any problems, how difficult have these problems made it for you to do your work, take care of things at home, or get along with other people?: Not difficult at all  Depression Treatment Depression Interventions/Treatment : [id number] Score <4 Follow-up Not Indicated     Goals Addressed               This Visit's Progress     Patient Stated  (pt-stated)        Patient stated she plans to continue taking meds and watch sugar intake             Objective:    Today's Vitals   12/29/24 1345  BP: 108/70  Pulse: 83  SpO2: 95%  Weight: 163 lb 6.4 oz (74.1 kg)  Height: 5' 4 (1.626 m)   Body mass index is 28.05 kg/m.  Hearing/Vision screen Hearing Screening - Comments:: Wears hearing aids Vision Screening - Comments:: Wears rx glasses - up to date with routine eye exams with Dr Elsie Carmine  Immunizations and Health Maintenance Health Maintenance  Topic Date Due   Zoster Vaccines- Shingrix (1 of 2) 03/23/1954   COVID-19 Vaccine (4 - 2025-26 season) 07/24/2024   Mammogram  01/25/2033 (Originally 01/14/2022)   Medicare Annual Wellness (AWV)  12/29/2025   Colonoscopy  03/31/2026   DTaP/Tdap/Td (5 - Td or Tdap) 03/15/2034   Pneumococcal Vaccine: 50+ Years  Completed   Influenza Vaccine  Completed   Bone Density Scan  Completed   Meningococcal B Vaccine  Aged Out        Assessment/Plan:  This is a routine wellness examination for Tammy Manning.  I have recommended that this patient have a immunization for Shingles but she declines at this time. I have discussed the risks and benefits of this procedure with her. The patient verbalizes understanding.   Patient Care Team: [name], Laine LABOR, MD as PCP - General (Family Medicine) Darron Deatrice LABOR, MD as PCP - Cardiology (Cardiology) Duwayne Purchase, MD (Orthopedic Surgery) Luis Purchase, MD (Gastroenterology) Darron Deatrice LABOR, MD as Consulting Physician (Cardiology) Carmine Elsie, MD as Referring Physician (Ophthalmology) Fate Morna SAILOR, [facility] (Inactive) as Pharmacist (Pharmacist) Carmine Elsie, MD as Referring Physician (Ophthalmology)  I have personally reviewed and noted the following in the patients chart:   Medical and social history Use of alcohol, tobacco or illicit drugs  Current medications and supplements including opioid  prescriptions. Functional ability and status Nutritional status Physical activity Advanced directives List of other physicians Hospitalizations, surgeries, and ER  visits in previous 12 months Vitals Screenings to include cognitive, depression, and falls Referrals and appointments  No orders of the defined types were placed in this encounter.  In addition, I have reviewed and discussed with patient certain preventive protocols, quality metrics, and best practice recommendations. A written personalized care plan for preventive services as well as general preventive health recommendations were provided to patient.   Verdie CHRISTELLA Saba, CMA   12/29/2024   Return in 1 year (on 12/29/2025).  After Visit Summary: (In Person-Declined) Patient declined AVS at this time.  Nurse Notes: scheduled 6-mth f/u appt w/PCP for 04/2025; scheduled 2027 AWV appt "

## 2024-12-29 NOTE — Patient Instructions (Addendum)
 Ms. Carollo,  Thank you for taking the time for your Medicare Wellness Visit. I appreciate your continued commitment to your health goals. Please review the care plan we discussed, and feel free to reach out if I can assist you further.  Please note that Annual Wellness Visits do not include a physical exam. Some assessments may be limited, especially if the visit was conducted virtually. If needed, we may recommend an in-person follow-up with your provider.  Ongoing Care Seeing your primary care provider every 3 to 6 months helps us  monitor your health and provide consistent, personalized care.   Referrals If a referral was made during today's visit and you haven't received any updates within two weeks, please contact the referred provider directly to check on the status.  Recommended Screenings:  Health Maintenance  Topic Date Due   Zoster (Shingles) Vaccine (1 of 2) 03/23/1954   COVID-19 Vaccine (4 - 2025-26 season) 07/24/2024   Breast Cancer Screening  01/25/2033*   Medicare Annual Wellness Visit  12/29/2025   Colon Cancer Screening  03/31/2026   DTaP/Tdap/Td vaccine (5 - Td or Tdap) 03/15/2034   Pneumococcal Vaccine for age over 72  Completed   Flu Shot  Completed   Osteoporosis screening with Bone Density Scan  Completed   Meningitis B Vaccine  Aged Out  *Topic was postponed. The date shown is not the original due date.       12/29/2024    1:46 PM  Advanced Directives  Does Patient Have a Medical Advance Directive? Yes  Type of Estate Agent of [place];Living will  Does patient want to make changes to medical advance directive? Yes (Inpatient - patient requests chaplain consult to change a medical advance directive)  Copy of Healthcare Power of Attorney in Chart? No - copy requested    Vision: Annual vision screenings are recommended for early detection of glaucoma, cataracts, and diabetic retinopathy. These exams can also reveal signs of chronic  conditions such as diabetes and high blood pressure.  Dental: Annual dental screenings help detect early signs of oral cancer, gum disease, and other conditions linked to overall health, including heart disease and diabetes.

## 2025-01-02 ENCOUNTER — Ambulatory Visit

## 2025-05-01 ENCOUNTER — Ambulatory Visit: Admitting: Family Medicine

## 2025-05-14 ENCOUNTER — Ambulatory Visit: Admitting: Physician Assistant

## 2025-11-08 ENCOUNTER — Encounter: Admitting: Family Medicine

## 2025-12-10 ENCOUNTER — Ambulatory Visit: Admitting: Urology

## 2025-12-31 ENCOUNTER — Ambulatory Visit
